# Patient Record
Sex: Female | Born: 1970
Health system: Southern US, Community
[De-identification: ages and names within clinical notes are randomized; demographics above are authoritative.]

## PROBLEM LIST (undated history)

## (undated) DIAGNOSIS — F419 Anxiety disorder, unspecified: Secondary | ICD-10-CM

## (undated) DIAGNOSIS — C50919 Malignant neoplasm of unspecified site of unspecified female breast: Secondary | ICD-10-CM

## (undated) DIAGNOSIS — N879 Dysplasia of cervix uteri, unspecified: Secondary | ICD-10-CM

## (undated) DIAGNOSIS — Z9221 Personal history of antineoplastic chemotherapy: Secondary | ICD-10-CM

## (undated) DIAGNOSIS — S0300XA Dislocation of jaw, unspecified side, initial encounter: Secondary | ICD-10-CM

## (undated) DIAGNOSIS — N200 Calculus of kidney: Secondary | ICD-10-CM

## (undated) DIAGNOSIS — Z923 Personal history of irradiation: Secondary | ICD-10-CM

## (undated) DIAGNOSIS — Z87442 Personal history of urinary calculi: Secondary | ICD-10-CM

## (undated) DIAGNOSIS — Z1379 Encounter for other screening for genetic and chromosomal anomalies: Secondary | ICD-10-CM

## (undated) DIAGNOSIS — Z9889 Other specified postprocedural states: Secondary | ICD-10-CM

## (undated) DIAGNOSIS — K219 Gastro-esophageal reflux disease without esophagitis: Secondary | ICD-10-CM

## (undated) DIAGNOSIS — G43909 Migraine, unspecified, not intractable, without status migrainosus: Secondary | ICD-10-CM

## (undated) DIAGNOSIS — J189 Pneumonia, unspecified organism: Secondary | ICD-10-CM

## (undated) DIAGNOSIS — R21 Rash and other nonspecific skin eruption: Secondary | ICD-10-CM

## (undated) DIAGNOSIS — R112 Nausea with vomiting, unspecified: Secondary | ICD-10-CM

## (undated) DIAGNOSIS — M810 Age-related osteoporosis without current pathological fracture: Secondary | ICD-10-CM

## (undated) HISTORY — DX: Age-related osteoporosis without current pathological fracture: M81.0

## (undated) HISTORY — DX: Rash and other nonspecific skin eruption: R21

## (undated) HISTORY — DX: Personal history of antineoplastic chemotherapy: Z92.21

## (undated) HISTORY — DX: Malignant neoplasm of unspecified site of unspecified female breast: C50.919

## (undated) HISTORY — PX: DILATION AND CURETTAGE OF UTERUS: SHX78

## (undated) HISTORY — PX: OTHER SURGICAL HISTORY: SHX169

## (undated) HISTORY — PX: ADENOIDECTOMY: SUR15

## (undated) HISTORY — DX: Migraine, unspecified, not intractable, without status migrainosus: G43.909

## (undated) HISTORY — PX: RHINOPLASTY: SUR1284

## (undated) HISTORY — DX: Personal history of irradiation: Z92.3

## (undated) HISTORY — DX: Encounter for other screening for genetic and chromosomal anomalies: Z13.79

---

## 1997-09-22 ENCOUNTER — Encounter: Admission: RE | Admit: 1997-09-22 | Discharge: 1997-09-22 | Payer: Self-pay | Admitting: Obstetrics & Gynecology

## 1997-09-22 ENCOUNTER — Other Ambulatory Visit: Admission: RE | Admit: 1997-09-22 | Discharge: 1997-09-22 | Payer: Self-pay | Admitting: Obstetrics & Gynecology

## 1998-03-11 ENCOUNTER — Encounter: Admission: RE | Admit: 1998-03-11 | Discharge: 1998-03-11 | Payer: Self-pay | Admitting: Obstetrics

## 1998-08-05 ENCOUNTER — Encounter: Admission: RE | Admit: 1998-08-05 | Discharge: 1998-08-05 | Payer: Self-pay | Admitting: Obstetrics

## 1998-12-08 ENCOUNTER — Ambulatory Visit (HOSPITAL_COMMUNITY): Admission: RE | Admit: 1998-12-08 | Discharge: 1998-12-08 | Payer: Self-pay | Admitting: Obstetrics

## 1999-04-28 ENCOUNTER — Encounter: Admission: RE | Admit: 1999-04-28 | Discharge: 1999-04-28 | Payer: Self-pay | Admitting: Obstetrics

## 1999-08-03 ENCOUNTER — Encounter: Payer: Self-pay | Admitting: Obstetrics

## 1999-08-03 ENCOUNTER — Ambulatory Visit (HOSPITAL_COMMUNITY): Admission: RE | Admit: 1999-08-03 | Discharge: 1999-08-03 | Payer: Self-pay | Admitting: Obstetrics

## 1999-08-18 ENCOUNTER — Encounter: Admission: RE | Admit: 1999-08-18 | Discharge: 1999-08-18 | Payer: Self-pay | Admitting: Obstetrics

## 2000-02-02 ENCOUNTER — Encounter: Admission: RE | Admit: 2000-02-02 | Discharge: 2000-02-02 | Payer: Self-pay | Admitting: Obstetrics

## 2000-04-10 HISTORY — PX: OTHER SURGICAL HISTORY: SHX169

## 2000-09-24 ENCOUNTER — Inpatient Hospital Stay (HOSPITAL_COMMUNITY): Admission: AD | Admit: 2000-09-24 | Discharge: 2000-09-24 | Payer: Self-pay | Admitting: Obstetrics

## 2000-11-22 ENCOUNTER — Encounter: Admission: RE | Admit: 2000-11-22 | Discharge: 2000-11-22 | Payer: Self-pay | Admitting: Obstetrics

## 2001-01-17 ENCOUNTER — Encounter: Admission: RE | Admit: 2001-01-17 | Discharge: 2001-01-17 | Payer: Self-pay | Admitting: Obstetrics

## 2002-12-29 ENCOUNTER — Ambulatory Visit (HOSPITAL_COMMUNITY): Admission: RE | Admit: 2002-12-29 | Discharge: 2002-12-29 | Payer: Self-pay | Admitting: Obstetrics

## 2002-12-29 ENCOUNTER — Encounter: Payer: Self-pay | Admitting: Obstetrics

## 2003-01-15 ENCOUNTER — Ambulatory Visit (HOSPITAL_COMMUNITY): Admission: RE | Admit: 2003-01-15 | Discharge: 2003-01-15 | Payer: Self-pay | Admitting: Obstetrics

## 2003-01-15 ENCOUNTER — Encounter: Payer: Self-pay | Admitting: Obstetrics

## 2003-04-06 ENCOUNTER — Encounter (INDEPENDENT_AMBULATORY_CARE_PROVIDER_SITE_OTHER): Payer: Self-pay | Admitting: Specialist

## 2003-04-06 ENCOUNTER — Inpatient Hospital Stay (HOSPITAL_COMMUNITY): Admission: RE | Admit: 2003-04-06 | Discharge: 2003-04-09 | Payer: Self-pay | Admitting: Obstetrics

## 2006-11-12 ENCOUNTER — Inpatient Hospital Stay (HOSPITAL_COMMUNITY): Admission: AD | Admit: 2006-11-12 | Discharge: 2006-11-15 | Payer: Self-pay | Admitting: Obstetrics & Gynecology

## 2007-01-17 ENCOUNTER — Encounter: Payer: Self-pay | Admitting: Obstetrics

## 2007-01-17 ENCOUNTER — Ambulatory Visit (HOSPITAL_COMMUNITY): Admission: RE | Admit: 2007-01-17 | Discharge: 2007-01-17 | Payer: Self-pay | Admitting: Obstetrics

## 2007-01-30 ENCOUNTER — Encounter: Admission: RE | Admit: 2007-01-30 | Discharge: 2007-01-30 | Payer: Self-pay | Admitting: Interventional Radiology

## 2007-02-21 ENCOUNTER — Ambulatory Visit (HOSPITAL_COMMUNITY): Admission: RE | Admit: 2007-02-21 | Discharge: 2007-02-21 | Payer: Self-pay | Admitting: Obstetrics

## 2007-05-14 ENCOUNTER — Encounter: Admission: RE | Admit: 2007-05-14 | Discharge: 2007-05-14 | Payer: Self-pay | Admitting: Interventional Radiology

## 2007-07-09 ENCOUNTER — Encounter: Admission: RE | Admit: 2007-07-09 | Discharge: 2007-07-09 | Payer: Self-pay | Admitting: Interventional Radiology

## 2007-07-25 ENCOUNTER — Encounter: Admission: RE | Admit: 2007-07-25 | Discharge: 2007-07-25 | Payer: Self-pay | Admitting: Interventional Radiology

## 2007-08-07 ENCOUNTER — Encounter: Admission: RE | Admit: 2007-08-07 | Discharge: 2007-08-07 | Payer: Self-pay | Admitting: Interventional Radiology

## 2007-09-03 ENCOUNTER — Encounter: Admission: RE | Admit: 2007-09-03 | Discharge: 2007-09-03 | Payer: Self-pay | Admitting: Interventional Radiology

## 2007-09-17 ENCOUNTER — Encounter: Admission: RE | Admit: 2007-09-17 | Discharge: 2007-09-17 | Payer: Self-pay | Admitting: Interventional Radiology

## 2009-12-07 ENCOUNTER — Ambulatory Visit (HOSPITAL_COMMUNITY): Admission: RE | Admit: 2009-12-07 | Discharge: 2009-12-07 | Payer: Self-pay | Admitting: Obstetrics

## 2010-04-10 DIAGNOSIS — C50311 Malignant neoplasm of lower-inner quadrant of right female breast: Secondary | ICD-10-CM

## 2010-04-10 DIAGNOSIS — Z170 Estrogen receptor positive status [ER+]: Secondary | ICD-10-CM

## 2010-04-10 HISTORY — DX: Malignant neoplasm of lower-inner quadrant of right female breast: C50.311

## 2010-04-10 HISTORY — DX: Estrogen receptor positive status (ER+): Z17.0

## 2010-08-23 NOTE — Op Note (Signed)
NAMECONSTANCE, WHITTLE              ACCOUNT NO.:  [id number]   MEDICAL RECORD NO.:  [id number]          PATIENT TYPE:  AMB   LOCATION:  SDC                           FACILITY:  [facility]   PHYSICIAN:  Charles A. Clearance Coots, M.D.DATE OF BIRTH:  1970-05-09   DATE OF PROCEDURE:  01/17/2007  DATE OF DISCHARGE:                               OPERATIVE REPORT   PREOPERATIVE DIAGNOSIS:  Vaginal polyps.   POSTOPERATIVE DIAGNOSIS:  Vaginal polyps.   PROCEDURE:  Resection of vaginal polyps.   SURGEON:  Charles A. Clearance Coots, M.D.   ANESTHESIA:  MAC with local 1% Xylocaine.   ESTIMATED BLOOD LOSS:  Negligible.   COMPLICATIONS:  None.   SPECIMEN:  Vaginal mucosal tissue.   FINDINGS:  Small hypertrophied polypoid tissue at the lower hymenal ring  remnant.   OPERATION:  The patient was brought to the operating room. After  satisfactory IV sedation, the legs were brought up in stirrups and the  vagina was prepped and draped in the usual sterile fashion.  The urinary  bladder was emptied of approximately 50 mL of clear urine.  The small  polyp at the lower right aspect of the hymenal ring remnant was grasped  with Allis forceps and was excised with the scalpel and submitted to  pathology for evaluation.  The mucosa was then closed with interrupted  sutures of 4-0 Vicryl. Two small polyps to the right of the polyp that  was excised were also grasped with Allis forceps and excised with  Metzenbaum scissors and submitted to pathology for evaluation.  The  mucosa was closed with interrupted sutures of 4-0 Vicryl.  Both areas  were anesthetized with subcutaneous injections of a total of  approximately 5 mL of 1% Xylocaine. There was no active bleeding at the  conclusion of the procedure. All instruments were retired.  The patient  tolerated the procedure well and was transported to the recovery room in  satisfactory condition.      Charles A. Clearance Coots, M.D.  Electronically Signed     CAH/MEDQ  D:   01/17/2007  T:  01/17/2007  Job:  [id number]

## 2010-08-26 NOTE — Op Note (Signed)
NAME:  [name], [name]                        ACCOUNT NO.:  [id number]   MEDICAL RECORD NO.:  [id number]                   PATIENT TYPE:  INP   LOCATION:  9399                                 FACILITY:  [facility]   PHYSICIAN:  Charles A. Clearance Coots, M.D.             DATE OF BIRTH:  06-02-1970   DATE OF PROCEDURE:  04/06/2003  DATE OF DISCHARGE:                                 OPERATIVE REPORT   PREOPERATIVE DIAGNOSIS:  Symptomatic uterine fibroids.   POSTOPERATIVE DIAGNOSIS:  Symptomatic uterine fibroids.   PROCEDURE:  Abdominal myomectomy.   SURGEON:  Charles A. Clearance Coots, M.D.   ASSISTANT:  Roseanna Rainbow, M.D.   ANESTHESIA:  General.   ESTIMATED BLOOD LOSS:  300 mL.   COMPLICATIONS:  None.   SPECIMENS:  Uterine fibroid.   FINDINGS:  Approximately 6 cm uterine fibroid in the space between the  urinary bladder, cervix, and lower uterine segment.   OPERATION:  The patient was brought to the operating room and the general  endotracheal anesthesia was established.  The abdomen was prepped and draped  in the usual sterile fashion in the supine position.  A small Pfannenstiel  incision was made with the scalpel that was deepened down to the fascia with  a scalpel.  The fascia was nicked in the midline and the fascial incision  was extended to the left and to the right with curved Mayo scissors.  The  superior and inferior fascial edges were taken off of the rectus muscle with  blunt and sharp dissection.  The rectus muscles were bluntly and sharply  divided in the midline superiorly and inferiorly, being careful to avoid the  urinary bladder inferiorly.  The peritoneum was entered digitally and was  sharply dissected superiorly and inferiorly, being careful to avoid the  urinary bladder inferiorly.  The O'Connor-O'Sullivan self-retaining  retractor was then placed in the incision and the bowel was packed off and  the anterior and posterior blades were placed.  The uterus was then  grasped  with a uterine manipulator and was elevated up into the abdomen.  The  moderate-sized fibroid was then palpated in the vesicocervicouterine space  and attached to the lower uterine segment and anterior cervix.  The  vesicouterine fold of peritoneum was then entered sharply and dissected off  of the lower uterine segment, and the urinary bladder was pushed down and  away from the uterine fibroid both sharply and with blunt dissection.  The  fibroid pseudocapsule was then entered and was dissected down to the base of  the fibroid, which was noted to be attached to the lower uterine segment and  the upper cervix.  A Kelly forceps was placed across the base of the fibroid  and the fibroid was excised above the Kelly forceps intact.  The base of the  fibroid was suture ligated with figure-of-eight sutures of  3-0 Monocryl.  The remaining dead space of tissue  was then plicated with continuous  interlocking sutures of 3-0 Monocryl.  The vesicouterine fold of peritoneum  was then closed with continuous suture of 3-0 Monocryl.  Hemostasis was  excellent at the conclusion of the procedure.  The pelvic cavity was  thoroughly irrigated with warm saline solution.  The anterior and posterior  blades were then removed from the O'Connor-O'Sullivan retractor and the  packing was removed.  The surgical technician indicated that all needle,  sponge, and instrument counts were correct.  The abdomen was then closed as  follows:  The peritoneum was grasped with a Kelly forceps and was closed  with a continuous suture of 2-0 Monocryl.  The fascia was closed with a  continuous suture of 2-0 Vicryl from each corner to the center.  Skin was  closed with a continuous subcuticular suture of 3-0 Monocryl.  Sterile  bandage was applied to the incision closure.  The patient tolerated the  procedure well, was transported to the recovery room in satisfactory  condition.                                                Charles A. Clearance Coots, M.D.    CAH/MEDQ  D:  04/06/2003  T:  04/06/2003  Job:  [id number]

## 2010-08-26 NOTE — Discharge Summary (Signed)
NAME:  [name], [name]                        ACCOUNT NO.:  [id number]   MEDICAL RECORD NO.:  [id number]                   PATIENT TYPE:  INP   LOCATION:  9319                                 FACILITY:  [facility]   PHYSICIAN:  Charles A. Clearance Coots, M.D.             DATE OF BIRTH:  01-17-1971   DATE OF ADMISSION:  04/06/2003  DATE OF DISCHARGE:  04/09/2003                                 DISCHARGE SUMMARY   ADMITTING DIAGNOSES:  Symptomatic uterine fibroid.   DISCHARGE DIAGNOSES:  Symptomatic uterine fibroid status post myomectomy.   DISPOSITION:  Discharged home in good condition.   REASON FOR ADMISSION:  A 40 year old G2, P0 white female with history of  left lower quadrant pain over the past year which has been worse over the  past two months.  The patient also complained of urinary frequency and pain  with intercourse.  Ultrasound was done along with an MRI which revealed a 6  cm uterine fibroid which was adjacent to the cervix and urinary bladder.  The patient desired definitive surgical management.   PAST SURGICAL HISTORY:  1. Laser leg surgery.  2. Sinus surgery.  3. Conization of cervix.   PAST MEDICAL HISTORY:  1. Asthma.  2. Depression.   MEDICATIONS:  1. Vitamins.  2. Iron.  3. Ginkgo biloba.  4. Luetin.  5. Celexa.   ALLERGIES:  PENICILLIN, TUSSIONEX.   FAMILY HISTORY:  Negative for GYN cancer.   SOCIAL HISTORY:  Married.  Negative tobacco, alcohol, or recreational drug  use.   PHYSICAL EXAMINATION:  GENERAL:  Well-nourished, well-developed white female  in no acute distress.  VITAL SIGNS:  Temperature 97.5, pulse 80, respiratory rate 16, blood  pressure 102/60.  HEENT:  Normal.  NECK:  Supple without thyromegaly.  LUNGS:  Clear to auscultation bilaterally.  HEART:  Regular rate and rhythm.  BREASTS:  Soft, nontender.  No masses appreciated.  ABDOMEN:  Soft, nontender.  No organomegaly or masses appreciated.  PELVIC:  Normal external female genitalia.   Vagina:  Normal mucosa.  Cervix:  Flat with no lesions, discharge, or bleeding noted.  Uterus normal size,  shape, contour, mid position.  Mild suprapubic tenderness to deep palpation.  Adnexa:  Questionable left adnexal to anterior mass coming off of the uterus  consistent with fibroid versus ovarian mass.  EXTREMITIES:  Zero clubbing, cyanosis, edema.  SKIN:  Normal.   IMPRESSION:  Symptomatic uterine fibroid.   PLAN:  Myomectomy.   ADMITTING LABORATORY VALUES:  Basic metabolic panel within normal limits.  Coags within normal limits.  Hemoglobin 12.8, hematocrit 37.3, white blood  cell count 9100, platelets 283,000.  Urine pregnancy negative.  Urinalysis  was within normal limits.   HOSPITAL COURSE:  The patient underwent an abdominal myomectomy on April 06, 2003.  There were no intraoperative complications.  Postoperative course  was uncomplicated except poor pain control.  The patient had much improved  pain control  by postoperative day #3 and was therefore discharged home on  postoperative day #3 in good condition.   DISCHARGE LABORATORIES:  Hemoglobin 10.3, hematocrit 29.5, white blood cell  count 10,100, platelets 251,000.   DISCHARGE DISPOSITION:   MEDICATIONS:  1. Ultram two tablets p.o. q.4-6h. as needed for pain.  2. Ibuprofen 800 mg p.o. q.8h. as needed for pain.  3. Allegra D one tablet p.o. b.i.d.  4. Continue multivitamins and iron.   DISCHARGE INSTRUCTIONS:  Routine written discharge instructions were given  per booklet for surgical discharge.  The patient is to follow up in the  office in two weeks for a check-up.                                               Charles A. Clearance Coots, M.D.    CAH/MEDQ  D:  04/09/2003  T:  04/09/2003  Job:  [id number]

## 2010-12-01 ENCOUNTER — Other Ambulatory Visit: Payer: Self-pay | Admitting: Obstetrics

## 2010-12-01 DIAGNOSIS — Z1231 Encounter for screening mammogram for malignant neoplasm of breast: Secondary | ICD-10-CM

## 2010-12-10 DIAGNOSIS — C50919 Malignant neoplasm of unspecified site of unspecified female breast: Secondary | ICD-10-CM

## 2010-12-10 HISTORY — DX: Malignant neoplasm of unspecified site of unspecified female breast: C50.919

## 2010-12-13 ENCOUNTER — Ambulatory Visit (HOSPITAL_COMMUNITY)
Admission: RE | Admit: 2010-12-13 | Discharge: 2010-12-13 | Disposition: A | Discharge status: Home / Self Care | Payer: BC Managed Care – PPO | Source: Ambulatory Visit | Attending: Obstetrics | Admitting: Obstetrics

## 2010-12-13 DIAGNOSIS — Z1231 Encounter for screening mammogram for malignant neoplasm of breast: Secondary | ICD-10-CM

## 2010-12-19 ENCOUNTER — Other Ambulatory Visit: Payer: Self-pay | Admitting: Obstetrics

## 2010-12-19 DIAGNOSIS — N63 Unspecified lump in unspecified breast: Secondary | ICD-10-CM

## 2010-12-22 ENCOUNTER — Other Ambulatory Visit: Payer: Self-pay | Admitting: Obstetrics

## 2010-12-22 ENCOUNTER — Ambulatory Visit
Admission: RE | Admit: 2010-12-22 | Discharge: 2010-12-22 | Disposition: A | Discharge status: Home / Self Care | Payer: BC Managed Care – PPO | Source: Ambulatory Visit | Attending: Obstetrics | Admitting: Obstetrics

## 2010-12-22 DIAGNOSIS — N63 Unspecified lump in unspecified breast: Secondary | ICD-10-CM

## 2010-12-23 ENCOUNTER — Other Ambulatory Visit: Payer: Self-pay | Admitting: Obstetrics

## 2010-12-23 ENCOUNTER — Ambulatory Visit
Admission: RE | Admit: 2010-12-23 | Discharge: 2010-12-23 | Disposition: A | Discharge status: Home / Self Care | Payer: BC Managed Care – PPO | Source: Ambulatory Visit | Attending: Obstetrics | Admitting: Obstetrics

## 2010-12-23 ENCOUNTER — Other Ambulatory Visit: Payer: BC Managed Care – PPO

## 2010-12-23 DIAGNOSIS — N63 Unspecified lump in unspecified breast: Secondary | ICD-10-CM

## 2010-12-26 ENCOUNTER — Other Ambulatory Visit: Payer: Self-pay | Admitting: Obstetrics

## 2010-12-26 ENCOUNTER — Ambulatory Visit
Admission: RE | Admit: 2010-12-26 | Discharge: 2010-12-26 | Disposition: A | Discharge status: Home / Self Care | Payer: BC Managed Care – PPO | Source: Ambulatory Visit | Attending: Obstetrics | Admitting: Obstetrics

## 2010-12-26 DIAGNOSIS — C50911 Malignant neoplasm of unspecified site of right female breast: Secondary | ICD-10-CM

## 2010-12-26 DIAGNOSIS — N63 Unspecified lump in unspecified breast: Secondary | ICD-10-CM

## 2010-12-30 ENCOUNTER — Ambulatory Visit
Admission: RE | Admit: 2010-12-30 | Discharge: 2010-12-30 | Disposition: A | Discharge status: Home / Self Care | Payer: BC Managed Care – PPO | Source: Ambulatory Visit | Attending: Obstetrics | Admitting: Obstetrics

## 2010-12-30 DIAGNOSIS — C50911 Malignant neoplasm of unspecified site of right female breast: Secondary | ICD-10-CM

## 2010-12-30 MED ORDER — GADOBENATE DIMEGLUMINE 529 MG/ML IV SOLN
12.0000 mL | Freq: Once | INTRAVENOUS | Status: AC | PRN
  Administered 2010-12-30: 12 mL via INTRAVENOUS

## 2011-01-04 ENCOUNTER — Ambulatory Visit (HOSPITAL_BASED_OUTPATIENT_CLINIC_OR_DEPARTMENT_OTHER): Payer: BC Managed Care – PPO | Admitting: General Surgery

## 2011-01-04 ENCOUNTER — Other Ambulatory Visit: Payer: Self-pay | Admitting: Oncology

## 2011-01-04 ENCOUNTER — Ambulatory Visit: Payer: BC Managed Care – PPO | Admitting: Physical Therapy

## 2011-01-04 ENCOUNTER — Encounter (INDEPENDENT_AMBULATORY_CARE_PROVIDER_SITE_OTHER): Payer: Self-pay | Admitting: General Surgery

## 2011-01-04 ENCOUNTER — Encounter (HOSPITAL_BASED_OUTPATIENT_CLINIC_OR_DEPARTMENT_OTHER): Payer: BC Managed Care – PPO | Admitting: Oncology

## 2011-01-04 VITALS — BP 115/75 | HR 85 | Temp 98.0°F | Resp 20 | Ht 63.5 in | Wt 132.5 lb

## 2011-01-04 DIAGNOSIS — C50911 Malignant neoplasm of unspecified site of right female breast: Secondary | ICD-10-CM

## 2011-01-04 DIAGNOSIS — N63 Unspecified lump in unspecified breast: Secondary | ICD-10-CM | POA: Insufficient documentation

## 2011-01-04 DIAGNOSIS — C50919 Malignant neoplasm of unspecified site of unspecified female breast: Secondary | ICD-10-CM

## 2011-01-04 DIAGNOSIS — Z170 Estrogen receptor positive status [ER+]: Secondary | ICD-10-CM

## 2011-01-04 LAB — CBC WITH DIFFERENTIAL/PLATELET
BASO%: 0.3 % (ref 0.0–2.0)
Basophils Absolute: 0 10*3/uL (ref 0.0–0.1)
EOS%: 0.8 % (ref 0.0–7.0)
Eosinophils Absolute: 0.1 10*3/uL (ref 0.0–0.5)
HCT: 41.2 % (ref 34.8–46.6)
HGB: 14 g/dL (ref 11.6–15.9)
LYMPH%: 21.3 % (ref 14.0–49.7)
MCH: 31.3 pg (ref 25.1–34.0)
MCHC: 33.9 g/dL (ref 31.5–36.0)
MCV: 92.1 fL (ref 79.5–101.0)
MONO#: 0.3 10*3/uL (ref 0.1–0.9)
MONO%: 4 % (ref 0.0–14.0)
NEUT#: 6.3 10*3/uL (ref 1.5–6.5)
NEUT%: 73.6 % (ref 38.4–76.8)
Platelets: 232 10*3/uL (ref 145–400)
RBC: 4.47 10*6/uL (ref 3.70–5.45)
RDW: 12.4 % (ref 11.2–14.5)
WBC: 8.5 10*3/uL (ref 3.9–10.3)
lymph#: 1.8 10*3/uL (ref 0.9–3.3)

## 2011-01-04 LAB — COMPREHENSIVE METABOLIC PANEL
ALT: 13 U/L (ref 0–35)
AST: 15 U/L (ref 0–37)
Albumin: 3.9 g/dL (ref 3.5–5.2)
Alkaline Phosphatase: 41 U/L (ref 39–117)
BUN: 10 mg/dL (ref 6–23)
CO2: 29 mEq/L (ref 19–32)
Calcium: 9.5 mg/dL (ref 8.4–10.5)
Chloride: 103 mEq/L (ref 96–112)
Creatinine, Ser: 0.8 mg/dL (ref 0.50–1.10)
Glucose, Bld: 75 mg/dL (ref 70–99)
Potassium: 3.8 mEq/L (ref 3.5–5.3)
Sodium: 140 mEq/L (ref 135–145)
Total Bilirubin: 0.3 mg/dL (ref 0.3–1.2)
Total Protein: 7 g/dL (ref 6.0–8.3)

## 2011-01-04 LAB — CANCER ANTIGEN 27.29: CA 27.29: 25 U/mL (ref 0–39)

## 2011-01-04 NOTE — Patient Instructions (Signed)
You have recently been diagnosed with a small invasive cancer in the lower inner quadrant of your right breast. We are very suspicious that you may have cancer that has spread to the right axillary lymph nodes. You will be scheduled for genetic counseling and genetic testing. You will be scheduled for image guided biopsy of the right axillary lymph node by the radiologist. My office will call you to schedule Port-A-Cath placement. I will see you back in the office in 6 weeks to see how you are doing with your chemotherapy. We will continue to discuss surgical options at that time.

## 2011-01-04 NOTE — Progress Notes (Addendum)
Chief Complaint  Patient presents with  . Breast Cancer    HPI [name] is a 40 y.o. female.    This is a 40 year old Caucasian female, referred to me by Dr. Harmon Pier. at the breast center for for evaluation of a newly diagnosed invasive ductal carcinoma of the right breast in the lower inner quadrant. Her gynecologist is Dr. Coral Ceo. Her dermatologist is Dr. Donzetta Starch.  The patient felt a small lump in her right breast in the lower inner quadrant about 2 months ago. Mammograms and ultrasound were performed on 12/22/2010. The radiologist was able to palpate a small mass. Ultrasound revealed a 1.7 x 1.1 cm mass in the right lower inner quadrant. The left breast was felt to be normal. Ultrasound suggested an abnormal right axillary lymph node.  Ultrasound guided biopsy was performed and this revealed invasive ductal carcinoma which was hormone receptor positive and HER-2/neu-positive.  Subsequent MRI was performed on 01/02/2011. This reveals a 2 cm mass in the lower inner quadrant of the right breast and a prominent right axillary lymph node. She has not had a right axillary lymph node biopsy at this time.  Her diagnosis and management was discussed at the breast conference this morning. I am evaluating her and coordinating her care in the breast clinic with Dr. Darnelle Catalan and Dr. Basilio Cairo.  It is notable that the patient has not had any breast problems in the past. It is notable that her family history is negative for breast cancer and negative for ovarian cancer. She is married. Her husband is here with her. They have a 66-year-old child. HPI  Past Medical History  Diagnosis Date  . Migraines   . Asthma     as a child    Past Surgical History  Procedure Date  . Rhinoplasty   . Adnoid   . Fibroid tumor surgery   . Dilation and curettage of uterus     Family History  Problem Relation Age of Onset  . Adopted: Yes    Social History History  Substance Use Topics  .  Smoking status: Former Smoker    Quit date: 04/10/1998  . Smokeless tobacco: Not on file  . Alcohol Use: Not on file    No Known Allergies  Current Outpatient Prescriptions  Medication Sig Dispense Refill  . aspirin-acetaminophen-caffeine (EXCEDRIN MIGRAINE) 250-250-65 MG per tablet Take 1 tablet by mouth 1 day or 1 dose.        . cephALEXin (KEFLEX) 500 MG capsule Take 500 mg by mouth 2 (two) times daily.        [name] guaiFENesin (MUCINEX) 600 MG 12 hr tablet Take 1,200 mg by mouth 2 (two) times daily.        . hydrOXYzine (ATARAX/VISTARIL) 25 MG tablet Take 25 mg by mouth 3 (three) times daily as needed.        [name] ibuprofen (ADVIL,MOTRIN) 800 MG tablet Take 800 mg by mouth every 8 (eight) hours as needed.        . loratadine (CLARITIN) 10 MG tablet Take 10 mg by mouth 2 (two) times daily.        . Multiple Vitamin (MULTIVITAMIN) capsule Take 1 capsule by mouth daily.          Review of Systems Review of Systems  Constitutional: Positive for fatigue. Negative for fever, chills, diaphoresis, appetite change and unexpected weight change.  HENT: Positive for sinus pressure.   Eyes: Negative.   Respiratory: Negative.   Cardiovascular: Negative.  Gastrointestinal: Negative.   Genitourinary: Negative.   Musculoskeletal: Positive for myalgias, back pain, joint swelling and arthralgias. Negative for gait problem.  Skin: Negative.   Neurological: Positive for headaches. Negative for dizziness, tremors, seizures, syncope, facial asymmetry, speech difficulty, weakness, light-headedness and numbness.  Hematological: Negative.   Psychiatric/Behavioral: Negative.     Blood pressure 115/75, pulse 85, temperature 98 F (36.7 C), resp. rate 20, height 5' 3.5" (1.613 m), weight 132 lb 8 oz (60.102 kg).  Physical Exam Physical Exam  Constitutional: She is oriented to person, place, and time. She appears well-developed and well-nourished. No distress.       Pleasant, healthy, thin. Husband with her  throughout the encounter.  HENT:  Head: Normocephalic and atraumatic.  Nose: Nose normal.  Mouth/Throat: No oropharyngeal exudate.  Eyes: Conjunctivae and EOM are normal. Pupils are equal, round, and reactive to light.  Neck: Normal range of motion. Neck supple. No JVD present. No tracheal deviation present. No thyromegaly present.  Cardiovascular: Normal rate, regular rhythm, normal heart sounds and intact distal pulses.  Exam reveals no gallop.   No murmur heard. Pulmonary/Chest: Effort normal and breath sounds normal. No stridor. No respiratory distress. She has no wheezes. She has no rales. She exhibits no tenderness.    Abdominal: Soft. Bowel sounds are normal. She exhibits no distension and no mass. There is no tenderness. There is no rebound and no guarding.  Musculoskeletal: Normal range of motion. She exhibits no edema and no tenderness.  Lymphadenopathy:    She has no cervical adenopathy.  Neurological: She is oriented to person, place, and time. She exhibits normal muscle tone. Coordination normal.  Skin: Skin is warm and dry. No rash noted. She is not diaphoretic. No erythema. No pallor.  Psychiatric: She has a normal mood and affect. Her behavior is normal. Judgment and thought content normal.    Data Reviewed I have reviewed her mammograms, ultrasound, MRI, pathology report and her slides. We have discussed her care in breast clinic this afternoon with Dr. Darnelle Catalan and Dr. Basilio Cairo. I have coordinated her care plan with them. Her case was discussed this morning in breast conference.  Assessment    Invasive ductal carcinoma left breast, lower inner quadrant, 2 cm tumor, receptor positive, HER-2/neu-positive. Clinical stage T1c., N1.    Plan    We had a long discussion about surgical management of her breast cancer, and cancer management in general. We talked about genetic counseling and testing. We talked about lumpectomy, axillary lymph node dissection. We talked about  mastectomy both unilateral and bilateral. We talked about plastic surgery to restore symmetry. At this point in time she is undecided as to the extent of her eventual surgery.  She is going to be scheduled for genetic counseling and genetic testing.  She is going to be scheduled for image guided biopsy of the right axillary lymph node, which is suspected as being positive.  I'm going to schedule her for Port-A-Cath placement.I have discussed the indications and details of Port-A-Cath placement. Risks and complications have been outlined, including but not limited to bleeding, infection, pneumothorax, catheter embolization, cardiac pulmonary and thromboembolic problems. She seems to understand these issues well. At this time all of her questions were answered. She is in full agreement with this plan.  Following the Port-A-Cath placement,Dr. Magrinat plans neoadjuvant chemotherapy for about 3 months.  After 3 months we will plan to get an another MRI to judge response to therapy.  Ultimately she will need definitive breast surgery  which will include an axillary lymph node dissection if she is proven to have node-positive disease, and sentinel lymph node if it is proven to be negative by image guided biopsy.  She will almost certainly need adjuvant radiation therapy.  I will see her back in approximately 6 weeks after her Port-A-Cath placement to see how she is doing with neoadjuvant therapy, and we will discuss her attitude and opinions regarding extent of definitive breast surgery at that time.  I have offered to refer her to a plastic surgeon and she is going to consider that.       Sherley Mckenney M 01/04/2011, 4:41 PM   2

## 2011-01-05 ENCOUNTER — Other Ambulatory Visit: Payer: Self-pay | Admitting: Oncology

## 2011-01-05 DIAGNOSIS — N63 Unspecified lump in unspecified breast: Secondary | ICD-10-CM

## 2011-01-05 DIAGNOSIS — C50919 Malignant neoplasm of unspecified site of unspecified female breast: Secondary | ICD-10-CM

## 2011-01-09 ENCOUNTER — Ambulatory Visit
Admission: RE | Admit: 2011-01-09 | Discharge: 2011-01-09 | Disposition: A | Discharge status: Home / Self Care | Payer: BC Managed Care – PPO | Source: Ambulatory Visit | Attending: Oncology | Admitting: Oncology

## 2011-01-09 DIAGNOSIS — N63 Unspecified lump in unspecified breast: Secondary | ICD-10-CM

## 2011-01-12 ENCOUNTER — Encounter (HOSPITAL_COMMUNITY)
Admission: RE | Admit: 2011-01-12 | Discharge: 2011-01-12 | Disposition: A | Discharge status: Home / Self Care | Payer: BC Managed Care – PPO | Source: Ambulatory Visit | Attending: Oncology | Admitting: Oncology

## 2011-01-12 DIAGNOSIS — C50919 Malignant neoplasm of unspecified site of unspecified female breast: Secondary | ICD-10-CM | POA: Insufficient documentation

## 2011-01-12 LAB — GLUCOSE, CAPILLARY: Glucose-Capillary: 87 mg/dL (ref 70–99)

## 2011-01-12 MED ORDER — IOHEXOL 300 MG/ML  SOLN
80.0000 mL | Freq: Once | INTRAMUSCULAR | Status: AC | PRN
  Administered 2011-01-12: 80 mL via INTRAVENOUS

## 2011-01-12 MED ORDER — FLUDEOXYGLUCOSE F - 18 (FDG) INJECTION
19.4000 | Freq: Once | INTRAVENOUS | Status: AC | PRN
  Administered 2011-01-12: 19.4 via INTRAVENOUS

## 2011-01-16 ENCOUNTER — Other Ambulatory Visit: Payer: Self-pay | Admitting: Oncology

## 2011-01-16 DIAGNOSIS — C50319 Malignant neoplasm of lower-inner quadrant of unspecified female breast: Secondary | ICD-10-CM

## 2011-01-16 LAB — CBC WITH DIFFERENTIAL/PLATELET
BASO%: 0.4 % (ref 0.0–2.0)
Basophils Absolute: 0 10*3/uL (ref 0.0–0.1)
EOS%: 2 % (ref 0.0–7.0)
Eosinophils Absolute: 0.1 10*3/uL (ref 0.0–0.5)
HCT: 41.2 % (ref 34.8–46.6)
HGB: 14 g/dL (ref 11.6–15.9)
LYMPH%: 26.5 % (ref 14.0–49.7)
MCH: 31.4 pg (ref 25.1–34.0)
MCHC: 34 g/dL (ref 31.5–36.0)
MCV: 92.2 fL (ref 79.5–101.0)
MONO#: 0.5 10*3/uL (ref 0.1–0.9)
MONO%: 7.5 % (ref 0.0–14.0)
NEUT#: 3.8 10*3/uL (ref 1.5–6.5)
NEUT%: 63.6 % (ref 38.4–76.8)
Platelets: 256 10*3/uL (ref 145–400)
RBC: 4.47 10*6/uL (ref 3.70–5.45)
RDW: 12.5 % (ref 11.2–14.5)
WBC: 6 10*3/uL (ref 3.9–10.3)
lymph#: 1.6 10*3/uL (ref 0.9–3.3)

## 2011-01-16 LAB — URINALYSIS, MICROSCOPIC - CHCC
Bilirubin (Urine): NEGATIVE
Glucose: NEGATIVE g/dL
Ketones: NEGATIVE mg/dL
Leukocyte Esterase: NEGATIVE
Nitrite: NEGATIVE
Protein: NEGATIVE mg/dL
Specific Gravity, Urine: 1.01 (ref 1.003–1.035)
pH: 7.5 (ref 4.6–8.0)

## 2011-01-16 LAB — COMPREHENSIVE METABOLIC PANEL
ALT: 16 U/L (ref 0–35)
AST: 22 U/L (ref 0–37)
Albumin: 4 g/dL (ref 3.5–5.2)
Alkaline Phosphatase: 53 U/L (ref 39–117)
BUN: 10 mg/dL (ref 6–23)
CO2: 31 mEq/L (ref 19–32)
Calcium: 9.6 mg/dL (ref 8.4–10.5)
Chloride: 100 mEq/L (ref 96–112)
Creatinine, Ser: 0.7 mg/dL (ref 0.50–1.10)
Glucose, Bld: 80 mg/dL (ref 70–99)
Potassium: 3.6 mEq/L (ref 3.5–5.3)
Sodium: 140 mEq/L (ref 135–145)
Total Bilirubin: 0.5 mg/dL (ref 0.3–1.2)
Total Protein: 7 g/dL (ref 6.0–8.3)

## 2011-01-17 ENCOUNTER — Ambulatory Visit (HOSPITAL_COMMUNITY): Payer: BC Managed Care – PPO

## 2011-01-17 ENCOUNTER — Ambulatory Visit (HOSPITAL_BASED_OUTPATIENT_CLINIC_OR_DEPARTMENT_OTHER)
Admission: RE | Admit: 2011-01-17 | Discharge: 2011-01-17 | Disposition: A | Discharge status: Home / Self Care | Payer: BC Managed Care – PPO | Source: Ambulatory Visit | Attending: General Surgery | Admitting: General Surgery

## 2011-01-17 ENCOUNTER — Ambulatory Visit (HOSPITAL_COMMUNITY): Payer: BC Managed Care – PPO | Attending: General Surgery

## 2011-01-17 DIAGNOSIS — G43909 Migraine, unspecified, not intractable, without status migrainosus: Secondary | ICD-10-CM | POA: Insufficient documentation

## 2011-01-17 DIAGNOSIS — C50919 Malignant neoplasm of unspecified site of unspecified female breast: Secondary | ICD-10-CM

## 2011-01-17 DIAGNOSIS — F411 Generalized anxiety disorder: Secondary | ICD-10-CM | POA: Insufficient documentation

## 2011-01-17 DIAGNOSIS — C50319 Malignant neoplasm of lower-inner quadrant of unspecified female breast: Secondary | ICD-10-CM | POA: Insufficient documentation

## 2011-01-18 NOTE — Op Note (Signed)
[name], [name]              ACCOUNT NO.:  [id number]  MEDICAL RECORD NO.:  [id number]  LOCATION:                                 FACILITY:  PHYSICIAN:  Angelia Mould. Derrell Lolling, M.D.DATE OF BIRTH:  [birth year]  DATE OF PROCEDURE:  01/17/2011 DATE OF DISCHARGE:                              OPERATIVE REPORT   PREOPERATIVE DIAGNOSIS:  Invasive ductal carcinoma, right breast, clinical stage T1c, N0.  POSTOPERATIVE DIAGNOSIS:  Invasive ductal carcinoma, right breast, clinical stage T1c, N0.  OPERATION PERFORMED: 1. Insertion of 8-French PowerPort-isp venous vascular access device. 2. Utilization of fluoroscopy for guidance of catheter insertion.  SURGEON:  Angelia Mould. Derrell Lolling, MD  OPERATIVE INDICATION:  This is a 40 year old Caucasian female who felt a small lump in the right breast in the lower inner quadrant recently. Mammograms and ultrasounds and MRIs and biopsies were performed.  By best judgment, she has invasive ductal carcinoma, measuring 1.7 x 1.1 cm in the right breast lower inner quadrant.  This tumor is hormone receptor positive and Her-2 positive.  X-rays suggested prominent right axillary lymph nodes.  Physical exam suggested some small lymph nodes. Image-guided biopsy of her lymph nodes shows normal lymph node tissue. Her oncologist has recommended neoadjuvant chemotherapy.  She is still trying to decide the extent of her surgery.  Genetic testing has just begun.  She has decided to go ahead with the preop chemotherapy and a Port-A-Cath is requested.  She has been counseled regarding Port-A-Cath insertion as an outpatient.  OPERATIVE TECHNIQUE:  Following the induction of general LMA anesthesia, the patient was positioned with a small roll behind her shoulders and her arms at her sides.  Her neck and chest were prepped and draped in a sterile fashion.  Surgical time-out was held identifying correct patient and correct procedure.  Intravenous antibiotics were given.   0.5% Marcaine with epinephrine was used as a local infiltration anesthetic.  The left subclavian venipuncture was performed and a guidewire inserted into the superior vena cava under fluoroscopic guidance.  A small incision was made at the wire insertion site.  Using fluoroscopy, I marked a template on the chest wall skin where I thought the catheter should be.  The template was designed to get the tip of the catheter in the superior vena cava just above the right atrium.  A transverse incision was made in the left infraclavicular area about 2 cm below the wire insertion site.  A subcutaneous pocket was created at the level of the pectoralis fascia.  Using a tunneling device, I drew the catheter from the port pocket site into the wire insertion site.  I then placed the catheter along the chest wall along the marked template and cut the catheter 21 cm in length.  The catheter was then secured to the port with a locking device and the port and catheter flushed with heparinized saline.  The port was sutured to the pectoralis fascia with 2 interrupted sutures of 2-0 Prolene.  I could still flush the catheter nicely.  With the patient back in Trendelenburg position, I inserted the dilator and peel-away sheath assembly over the guidewire without any difficulty.  The wire and the dilator were  removed and the catheter was threaded through the peel-away sheath and the peel-away sheath removed. The catheter flushed easily and had excellent blood return.  Fluoroscopy confirmed the catheter to be in good position with the tip in the superior vena cava just above the right atrial junction.  There was no deformity of the catheter anywhere along its course.  The port and catheter were then flushed with concentrated heparin.  There was no bleeding in the wounds.  Subcutaneous tissue was closed with interrupted 3-0 Vicryl sutures and the skin incisions closed with subcuticular sutures of 4-0 Monocryl and  Steri-Strips.  Clean bandage was placed and the patient taken to the recovery room in stable condition.  ESTIMATED BLOOD LOSS:  About 10 mL.  COMPLICATIONS:  None.  Sponge, needle, and instrument counts were correct.  A chest x-ray will be obtained in the PACU.     Angelia Mould. Derrell Lolling, M.D.     HMI/MEDQ  D:  01/17/2011  T:  01/17/2011  Job:  [id number]  cc:   Lowella Dell, M.D. Charles A. Clearance Coots, M.D.  Electronically Signed by Claud Kelp M.D. on 01/18/2011 06:05:29 AM

## 2011-01-19 ENCOUNTER — Ambulatory Visit (HOSPITAL_COMMUNITY)
Admission: RE | Admit: 2011-01-19 | Discharge: 2011-01-19 | Disposition: A | Discharge status: Home / Self Care | Payer: BC Managed Care – PPO | Source: Ambulatory Visit | Attending: Oncology | Admitting: Oncology

## 2011-01-19 DIAGNOSIS — C50919 Malignant neoplasm of unspecified site of unspecified female breast: Secondary | ICD-10-CM | POA: Insufficient documentation

## 2011-01-19 LAB — CBC
HCT: 38.9
Hemoglobin: 13
MCHC: 33.5
MCV: 84.7
Platelets: 268
RBC: 4.59
RDW: 17.9 — ABNORMAL HIGH
WBC: 6.2

## 2011-01-23 ENCOUNTER — Encounter (HOSPITAL_BASED_OUTPATIENT_CLINIC_OR_DEPARTMENT_OTHER): Payer: BC Managed Care – PPO | Admitting: Oncology

## 2011-01-23 ENCOUNTER — Other Ambulatory Visit: Payer: Self-pay | Admitting: Obstetrics

## 2011-01-23 DIAGNOSIS — C50919 Malignant neoplasm of unspecified site of unspecified female breast: Secondary | ICD-10-CM

## 2011-01-23 LAB — CBC
HCT: 33.8 — ABNORMAL LOW
Hemoglobin: 11.3 — ABNORMAL LOW
MCHC: 33.3
MCV: 83.9
Platelets: 281
RBC: 4.03
RDW: 13.8
WBC: 13.2 — ABNORMAL HIGH

## 2011-01-23 LAB — RPR: RPR Ser Ql: NONREACTIVE

## 2011-01-26 ENCOUNTER — Encounter: Payer: Self-pay | Admitting: *Deleted

## 2011-01-26 ENCOUNTER — Encounter (HOSPITAL_COMMUNITY): Payer: Self-pay | Admitting: Internal Medicine

## 2011-02-01 ENCOUNTER — Telehealth: Payer: Self-pay | Admitting: *Deleted

## 2011-02-01 ENCOUNTER — Other Ambulatory Visit: Payer: Self-pay | Admitting: Physician Assistant

## 2011-02-01 DIAGNOSIS — C50919 Malignant neoplasm of unspecified site of unspecified female breast: Secondary | ICD-10-CM

## 2011-02-02 ENCOUNTER — Encounter (HOSPITAL_BASED_OUTPATIENT_CLINIC_OR_DEPARTMENT_OTHER): Payer: BC Managed Care – PPO | Admitting: Oncology

## 2011-02-02 DIAGNOSIS — C50319 Malignant neoplasm of lower-inner quadrant of unspecified female breast: Secondary | ICD-10-CM

## 2011-02-02 DIAGNOSIS — Z5111 Encounter for antineoplastic chemotherapy: Secondary | ICD-10-CM

## 2011-02-03 ENCOUNTER — Encounter (HOSPITAL_BASED_OUTPATIENT_CLINIC_OR_DEPARTMENT_OTHER): Payer: BC Managed Care – PPO | Admitting: Oncology

## 2011-02-03 DIAGNOSIS — Z5189 Encounter for other specified aftercare: Secondary | ICD-10-CM

## 2011-02-03 DIAGNOSIS — C50319 Malignant neoplasm of lower-inner quadrant of unspecified female breast: Secondary | ICD-10-CM

## 2011-02-05 ENCOUNTER — Other Ambulatory Visit: Payer: Self-pay | Admitting: Obstetrics

## 2011-02-06 ENCOUNTER — Other Ambulatory Visit: Payer: Self-pay | Admitting: *Deleted

## 2011-02-07 ENCOUNTER — Encounter: Payer: Self-pay | Admitting: *Deleted

## 2011-02-09 ENCOUNTER — Other Ambulatory Visit: Payer: Self-pay | Admitting: Oncology

## 2011-02-09 ENCOUNTER — Ambulatory Visit (HOSPITAL_COMMUNITY)
Admission: RE | Admit: 2011-02-09 | Discharge: 2011-02-09 | Disposition: A | Discharge status: Home / Self Care | Payer: BC Managed Care – PPO | Source: Ambulatory Visit | Attending: Internal Medicine | Admitting: Internal Medicine

## 2011-02-09 ENCOUNTER — Encounter (HOSPITAL_BASED_OUTPATIENT_CLINIC_OR_DEPARTMENT_OTHER): Payer: BC Managed Care – PPO | Admitting: Oncology

## 2011-02-09 VITALS — BP 92/46 | Wt 128.4 lb

## 2011-02-09 DIAGNOSIS — R609 Edema, unspecified: Secondary | ICD-10-CM

## 2011-02-09 DIAGNOSIS — K5900 Constipation, unspecified: Secondary | ICD-10-CM

## 2011-02-09 DIAGNOSIS — N63 Unspecified lump in unspecified breast: Secondary | ICD-10-CM

## 2011-02-09 DIAGNOSIS — C50919 Malignant neoplasm of unspecified site of unspecified female breast: Secondary | ICD-10-CM | POA: Insufficient documentation

## 2011-02-09 DIAGNOSIS — C50319 Malignant neoplasm of lower-inner quadrant of unspecified female breast: Secondary | ICD-10-CM

## 2011-02-09 LAB — CBC WITH DIFFERENTIAL/PLATELET
BASO%: 0.7 % (ref 0.0–2.0)
Basophils Absolute: 0.1 10*3/uL (ref 0.0–0.1)
EOS%: 0.3 % (ref 0.0–7.0)
Eosinophils Absolute: 0 10*3/uL (ref 0.0–0.5)
HCT: 38.8 % (ref 34.8–46.6)
HGB: 13.4 g/dL (ref 11.6–15.9)
LYMPH%: 26.8 % (ref 14.0–49.7)
MCH: 30.4 pg (ref 25.1–34.0)
MCHC: 34.5 g/dL (ref 31.5–36.0)
MCV: 88 fL (ref 79.5–101.0)
MONO#: 2 10*3/uL — ABNORMAL HIGH (ref 0.1–0.9)
MONO%: 18.9 % — ABNORMAL HIGH (ref 0.0–14.0)
NEUT#: 5.7 10*3/uL (ref 1.5–6.5)
NEUT%: 53.3 % (ref 38.4–76.8)
Platelets: 188 10*3/uL (ref 145–400)
RBC: 4.41 10*6/uL (ref 3.70–5.45)
RDW: 12.1 % (ref 11.2–14.5)
WBC: 10.7 10*3/uL — ABNORMAL HIGH (ref 3.9–10.3)
lymph#: 2.9 10*3/uL (ref 0.9–3.3)
nRBC: 0 % (ref 0–0)

## 2011-02-09 LAB — URINALYSIS, MICROSCOPIC - CHCC
Bilirubin (Urine): NEGATIVE
Glucose: NEGATIVE g/dL
Ketones: NEGATIVE mg/dL
Leukocyte Esterase: NEGATIVE
Nitrite: NEGATIVE
Protein: <30 mg/dL
Specific Gravity, Urine: 1.01 (ref 1.003–1.035)
pH: 6 (ref 4.6–8.0)

## 2011-02-09 NOTE — Patient Instructions (Signed)
No medications added at this time.  Follow up in 3 months with a repeat echocardiogram.

## 2011-02-09 NOTE — Progress Notes (Signed)
HPI:  [name] is a 40 yo with recently diagnosed (12/22/2010) right invasive ductal carcinoma, grade 2, estrogen receptor positive, progesterone receptor positive and Her2 amplification.  She was started on carboplatin and docetaxel together with Herceptin every 3 weeks x6 (first dose last week) then Herceptin is to be continued for a total of 1 year.  She was referred to Dr. Gala Romney for close monitoring for cardiotoxicity while on herceptin.    Echo reviewed from 01/19/11:  EF 50-55%.  Lateral S' velocity 10.5 cm/s  She presents for further evaluation.  Up to this date has undergone 1 round of chemotherapy.  She experienced mild dizziness while getting out of the shower the other day.  Other than that she denies SOB/orthopnea/PND/CP.  She denies lower extremity edema.  She has noted since her initial chemotherapy session her BP has been soft.     ROS: All other systems normal except as mentioned in HPI, past medical history and problem list.    Past Medical History  Diagnosis Date  . Migraines   . Asthma     as a child  . Breast cancer Sept 2012    right breast    Current Outpatient Prescriptions  Medication Sig Dispense Refill  . aspirin-acetaminophen-caffeine (EXCEDRIN MIGRAINE) 250-250-65 MG per tablet Take 1 tablet by mouth 1 day or 1 dose.        . cephALEXin (KEFLEX) 500 MG capsule Take 500 mg by mouth 2 (two) times daily.        [name] guaiFENesin (MUCINEX) 600 MG 12 hr tablet Take 1,200 mg by mouth 2 (two) times daily.        . hydrOXYzine (ATARAX/VISTARIL) 25 MG tablet Take 25 mg by mouth 3 (three) times daily as needed.        [name] ibuprofen (ADVIL,MOTRIN) 800 MG tablet Take 800 mg by mouth every 8 (eight) hours as needed.        . loratadine (CLARITIN) 10 MG tablet Take 10 mg by mouth 2 (two) times daily.        . Multiple Vitamin (MULTIVITAMIN) capsule Take 1 capsule by mouth daily.           Allergies  Allergen Reactions  . Codeine   . Morphine And Related   .  Penicillins   . Tussionex Pennkinetic Er (Chlorpheniramine-Hydrocodone)     History   Social History  . Marital Status: Married    Spouse Name: N/A    Number of Children: N/A  . Years of Education: N/A   Occupational History  . Not on file.   Social History Main Topics  . Smoking status: Former Smoker    Quit date: 04/10/1998  . Smokeless tobacco: Not on file  . Alcohol Use: No  . Drug Use: No  . Sexually Active: Not on file   Other Topics Concern  . Not on file   Social History Narrative   She was adopted by her grandparents as a child.  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  She works in a Agricultural consultant.    Family History  Problem Relation Age of Onset  . Adopted: Yes  . Heart attack Father     PHYSICAL EXAM: Filed Vitals:   02/09/11 1627  BP: 92/46  Wt: 128  General:  Well appearing. No respiratory difficulty HEENT: normal Neck: supple. Flat JVD. Carotids 2+ bilat; no bruits. No lymphadenopathy or thryomegaly appreciated. Cor: PMI nondisplaced. Regular rate & rhythm. No  rubs, gallops or murmurs. Lungs: clear Abdomen: soft, nontender, nondistended. No hepatosplenomegaly. No bruits or masses. Good bowel sounds. Extremities: no cyanosis, clubbing, rash, edema, port site below mid clavicular area on right side Neuro: alert & oriented x 3, cranial nerves grossly intact. moves all 4 extremities w/o difficulty. Affect pleasant.  ECG:  Results for orders placed in visit on 02/09/11 (from the past 24 hour(s))  URINALYSIS, MICROSCOPIC - [facility]     Status: Normal   Collection Time   02/09/11  2:21 PM      Component Value Range   Glucose Negative  Negative (g/dL)   Bilirubin (Urine) Negative  Negative    Ketones Negative  Negative (mg/dL)   Specific Gravity, Urine 1.010  1.003 - 1.035    Blood Moderate  Negative    pH 6.0  4.6 - 8.0    Protein < 30  Negative- <30 (mg/dL)   Nitrite Negative  Negative    Leukocyte Esterase Negative   Negative    RBC count 7-10  0 - 2    WBC, UA 0-2  0 - 2    Bacteria, UA Few  Negative- Trace    Epithelial Cells Few  Negative- Few    Mucus, UA Small  Negative- Small    No results found.   ASSESSMENT & PLAN:

## 2011-02-09 NOTE — Assessment & Plan Note (Addendum)
Discussed risks of Herceptin cartiotoxicity as well as role of cardio-oncology clinic in early detection and treatment, if needed. Echo and chart reviewed.  EF 50-55%.  Continue Herceptin therapy at this time.  Will not add beta blocker or ACE-I at this time with soft BP and no AC therapy.  Will continue to monitor for any decline in EF with echos every 3 months.    Patient seen and examined with Ulyess Blossom PA-C. We discussed all aspects of the encounter. I agree with the assessment and plan as stated above.

## 2011-02-11 LAB — URINE CULTURE

## 2011-02-13 NOTE — Progress Notes (Signed)
Patient seen and examined with Nicki Bradley PA-C. We discussed all aspects of the encounter. I agree with the assessment and plan as stated above.   

## 2011-02-14 NOTE — Telephone Encounter (Signed)
Looking up pt. Info.

## 2011-02-16 ENCOUNTER — Other Ambulatory Visit (HOSPITAL_BASED_OUTPATIENT_CLINIC_OR_DEPARTMENT_OTHER): Payer: BC Managed Care – PPO | Admitting: Lab

## 2011-02-16 DIAGNOSIS — C50319 Malignant neoplasm of lower-inner quadrant of unspecified female breast: Secondary | ICD-10-CM

## 2011-02-16 DIAGNOSIS — C50919 Malignant neoplasm of unspecified site of unspecified female breast: Secondary | ICD-10-CM

## 2011-02-16 LAB — CBC WITH DIFFERENTIAL/PLATELET
BASO%: 0.3 % (ref 0.0–2.0)
Basophils Absolute: 0 10*3/uL (ref 0.0–0.1)
EOS%: 0.2 % (ref 0.0–7.0)
Eosinophils Absolute: 0 10*3/uL (ref 0.0–0.5)
HCT: 36.5 % (ref 34.8–46.6)
HGB: 12.5 g/dL (ref 11.6–15.9)
LYMPH%: 22.7 % (ref 14.0–49.7)
MCH: 31.1 pg (ref 25.1–34.0)
MCHC: 34.3 g/dL (ref 31.5–36.0)
MCV: 90.7 fL (ref 79.5–101.0)
MONO#: 0.5 10*3/uL (ref 0.1–0.9)
MONO%: 4.9 % (ref 0.0–14.0)
NEUT#: 7.7 10*3/uL — ABNORMAL HIGH (ref 1.5–6.5)
NEUT%: 71.9 % (ref 38.4–76.8)
Platelets: 168 10*3/uL (ref 145–400)
RBC: 4.03 10*6/uL (ref 3.70–5.45)
RDW: 12.6 % (ref 11.2–14.5)
WBC: 10.7 10*3/uL — ABNORMAL HIGH (ref 3.9–10.3)
lymph#: 2.4 10*3/uL (ref 0.9–3.3)

## 2011-02-17 ENCOUNTER — Telehealth: Payer: Self-pay | Admitting: *Deleted

## 2011-02-17 MED ORDER — LORAZEPAM 0.5 MG PO TABS: 0.5000 mg | ORAL_TABLET | Freq: Three times a day (TID) | ORAL | Status: AC

## 2011-02-17 NOTE — Telephone Encounter (Signed)
Rec call from pt reporting that she would like to have a RX called in for anxiety. Pt states that MD ordered Ativan during her chemo infusion. Rx for Ativan obtained from Dr Donnie Coffin in Dr Magrinat's absence.

## 2011-02-22 ENCOUNTER — Other Ambulatory Visit: Payer: Self-pay | Admitting: Oncology

## 2011-02-23 ENCOUNTER — Other Ambulatory Visit (HOSPITAL_BASED_OUTPATIENT_CLINIC_OR_DEPARTMENT_OTHER): Payer: BC Managed Care – PPO | Admitting: Lab

## 2011-02-23 ENCOUNTER — Other Ambulatory Visit: Payer: Self-pay | Admitting: Oncology

## 2011-02-23 ENCOUNTER — Ambulatory Visit (HOSPITAL_BASED_OUTPATIENT_CLINIC_OR_DEPARTMENT_OTHER): Payer: BC Managed Care – PPO

## 2011-02-23 ENCOUNTER — Ambulatory Visit (HOSPITAL_BASED_OUTPATIENT_CLINIC_OR_DEPARTMENT_OTHER): Payer: BC Managed Care – PPO | Admitting: Physician Assistant

## 2011-02-23 ENCOUNTER — Encounter: Payer: Self-pay | Admitting: Physician Assistant

## 2011-02-23 VITALS — BP 118/74 | HR 84 | Temp 97.5°F | Ht 63.5 in | Wt 130.5 lb

## 2011-02-23 DIAGNOSIS — Z170 Estrogen receptor positive status [ER+]: Secondary | ICD-10-CM

## 2011-02-23 DIAGNOSIS — N63 Unspecified lump in unspecified breast: Secondary | ICD-10-CM

## 2011-02-23 DIAGNOSIS — C50919 Malignant neoplasm of unspecified site of unspecified female breast: Secondary | ICD-10-CM

## 2011-02-23 DIAGNOSIS — C50319 Malignant neoplasm of lower-inner quadrant of unspecified female breast: Secondary | ICD-10-CM

## 2011-02-23 DIAGNOSIS — Z5111 Encounter for antineoplastic chemotherapy: Secondary | ICD-10-CM

## 2011-02-23 LAB — CBC WITH DIFFERENTIAL/PLATELET
BASO%: 0 % (ref 0.0–2.0)
Basophils Absolute: 0 10*3/uL (ref 0.0–0.1)
EOS%: 0 % (ref 0.0–7.0)
Eosinophils Absolute: 0 10*3/uL (ref 0.0–0.5)
HCT: 38.1 % (ref 34.8–46.6)
HGB: 13 g/dL (ref 11.6–15.9)
LYMPH%: 4.6 % — ABNORMAL LOW (ref 14.0–49.7)
MCH: 31.4 pg (ref 25.1–34.0)
MCHC: 34.2 g/dL (ref 31.5–36.0)
MCV: 91.9 fL (ref 79.5–101.0)
MONO#: 0.2 10*3/uL (ref 0.1–0.9)
MONO%: 1.6 % (ref 0.0–14.0)
NEUT#: 12.1 10*3/uL — ABNORMAL HIGH (ref 1.5–6.5)
NEUT%: 93.8 % — ABNORMAL HIGH (ref 38.4–76.8)
Platelets: 289 10*3/uL (ref 145–400)
RBC: 4.15 10*6/uL (ref 3.70–5.45)
RDW: 12.6 % (ref 11.2–14.5)
WBC: 12.9 10*3/uL — ABNORMAL HIGH (ref 3.9–10.3)
lymph#: 0.6 10*3/uL — ABNORMAL LOW (ref 0.9–3.3)

## 2011-02-23 MED ORDER — TRASTUZUMAB CHEMO INJECTION 440 MG
6.0000 mg/kg | Freq: Once | INTRAVENOUS | Status: AC
  Administered 2011-02-23: 357 mg via INTRAVENOUS
  Filled 2011-02-23: qty 17

## 2011-02-23 MED ORDER — DEXAMETHASONE SODIUM PHOSPHATE 4 MG/ML IJ SOLN
20.0000 mg | Freq: Once | INTRAMUSCULAR | Status: AC
  Administered 2011-02-23: 20 mg via INTRAVENOUS

## 2011-02-23 MED ORDER — SODIUM CHLORIDE 0.9 % IJ SOLN
10.0000 mL | INTRAMUSCULAR | Status: DC | PRN
  Administered 2011-02-23: 10 mL
  Filled 2011-02-23: qty 10

## 2011-02-23 MED ORDER — SODIUM CHLORIDE 0.9 % IV SOLN: Freq: Once | INTRAVENOUS | Status: DC

## 2011-02-23 MED ORDER — HEPARIN SOD (PORK) LOCK FLUSH 100 UNIT/ML IV SOLN
500.0000 [IU] | Freq: Once | INTRAVENOUS | Status: AC | PRN
  Administered 2011-02-23: 500 [IU]
  Filled 2011-02-23: qty 5

## 2011-02-23 MED ORDER — LORAZEPAM 2 MG/ML IJ SOLN
0.5000 mg | Freq: Once | INTRAMUSCULAR | Status: AC
  Administered 2011-02-23: 0.5 mg via INTRAVENOUS

## 2011-02-23 MED ORDER — HEPARIN SOD (PORK) LOCK FLUSH 100 UNIT/ML IV SOLN
500.0000 [IU] | Freq: Once | INTRAVENOUS | Status: DC | PRN
  Filled 2011-02-23: qty 5

## 2011-02-23 MED ORDER — DIPHENHYDRAMINE HCL 25 MG PO CAPS
25.0000 mg | ORAL_CAPSULE | Freq: Once | ORAL | Status: AC
  Administered 2011-02-23: 25 mg via ORAL

## 2011-02-23 MED ORDER — ONDANSETRON 16 MG/50ML IVPB (CHCC)
16.0000 mg | Freq: Once | INTRAVENOUS | Status: AC
  Administered 2011-02-23: 16 mg via INTRAVENOUS

## 2011-02-23 MED ORDER — SODIUM CHLORIDE 0.9 % IJ SOLN
10.0000 mL | INTRAMUSCULAR | Status: DC | PRN
  Filled 2011-02-23: qty 10

## 2011-02-23 MED ORDER — DOCETAXEL CHEMO INJECTION 160 MG/16ML
75.0000 mg/m2 | Freq: Once | INTRAVENOUS | Status: AC
  Administered 2011-02-23: 120 mg via INTRAVENOUS
  Filled 2011-02-23: qty 12

## 2011-02-23 MED ORDER — ACETAMINOPHEN 325 MG PO TABS
650.0000 mg | ORAL_TABLET | Freq: Once | ORAL | Status: AC
  Administered 2011-02-23: 650 mg via ORAL

## 2011-02-23 MED ORDER — SODIUM CHLORIDE 0.9 % IV SOLN
751.8000 mg | Freq: Once | INTRAVENOUS | Status: AC
  Administered 2011-02-23: 750 mg via INTRAVENOUS
  Filled 2011-02-23: qty 75

## 2011-02-23 NOTE — Progress Notes (Signed)
Hematology and Oncology Follow Up Visit  [name] [id number] January 21, 1971 40 y.o. 02/23/2011 1:45 PM   Interim History:   Patient returns today accompanied by her husband for followup of her locally advanced right breast carcinoma, due for day 1 cycle 2 of 6 planned q. 3 week doses of docetaxel, carboplatin, and trastuzumab , given in the neoadjuvant setting. She receives Neulasta on day 2 for granulocyte support. Overall, the patient tolerated the first dose rather well, but did have many questions today. In fact over half of our 45 minute appointment today was spent answering her questions and going over her regimen again prior to her second dose.  The patient had a slight upper respiratory infection earlier in the week which is improving slowly. She is utilizing Mucinex affectively. She has had no fevers chills or night sweats. She has an occasional cough, but no increased shortness of breath. Her 55-year-old son has also been sick. Otherwise, the patient has recovered quite well from her first dose of chemotherapy. She of course lost her hair, but is wearing a very nice wig, although she is a little tearful when discussing this today. She started her menstrual cycle over the weekend, November 11. She continues to be fatigued. She's had no signs of numbness or tingling in the extremities. No mouth ulcers or oral sensitivity. No excessive tearing. No skin changes, rashes, or nail bed changes.  A detailed review of systems is otherwise noncontributory as noted below.  Review of Systems: Constitutional:  Fatigue, but no weight loss, fever, night sweats Eyes: negative WUJ:WJXBJ congestion and sore throat Cardiovascular: no chest pain or dyspnea on exertion Respiratory: positive for - cough, but no shortness of breath Neurological: negative Dermatological: negative Gastrointestinal: no abdominal pain, change in bowel habits, or black or bloody stools Genito-Urinary: no dysuria, trouble  voiding, or hematuria Hematological and Lymphatic: negative Breast: positive for - palpable mass in right breast Musculoskeletal: negative Remaining ROS negative.  Medications: I have reviewed the patient's current medications.  Allergies:  Allergies  Allergen Reactions  . Codeine   . Morphine And Related   . Penicillins   . Tussionex Pennkinetic Er (Chlorpheniramine-Hydrocodone)      Physical Exam:  Blood pressure 118/74, pulse 84, temperature 97.5 F (36.4 C), height 5' 3.5" (1.613 m), weight 130 lb 8 oz (59.194 kg). HEENT:  Sclerae anicteric, conjunctivae pink.  Oropharynx clear.  No mucositis or candidiasis.  Nodes:  No cervical, supraclavicular, or axillary lymphadenopathy palpated.  Breast Exam:  Right breast with palpable mass in the inner lower quadrant, measuring approximately 2 cm palpation.  No obvious skin changes. Left breast is benign.  No masses, discharge, skin change, or nipple inversion. Port is intact in the left upper chest wall, no erythema, edema, or evidence of infection.  Lungs:  Clear to auscultation bilaterally.  No crackles, rhonchi, or wheezes.  Heart:  Regular rate and rhythm.  Abdomen:  Soft, nontender.  Positive bowel sounds.  No organomegaly or masses palpated.  Musculoskeletal:  No focal spinal tenderness to palpation.  Extremities:  Benign.  No peripheral edema or cyanosis.  Skin:  Benign.  Neuro:  Nonfocal.  Lab Results: Lab Results  Component Value Date   WBC 12.9* 02/23/2011   HGB 13.0 02/23/2011   HCT 38.1 02/23/2011   MCV 91.9 02/23/2011   PLT 289 02/23/2011     Chemistry      Component Value Date/Time   NA 140 01/16/2011 1550   K 3.6 01/16/2011 1550  CL 100 01/16/2011 1550   CO2 31 01/16/2011 1550   BUN 10 01/16/2011 1550   CREATININE 0.70 01/16/2011 1550      Component Value Date/Time   CALCIUM 9.6 01/16/2011 1550   ALKPHOS 53 01/16/2011 1550   AST 22 01/16/2011 1550   ALT 16 01/16/2011 1550   BILITOT 0.5 01/16/2011 1550          Impression and Plan: 40 year old [place], [name] woman, status post right breast biopsy in September 2012 for a clinical 2 cm invasive ductal carcinoma, with some enlarged lymph nodes, the largest one of which was negative by biopsy. The tumor was grade 2, ER +88%, PR +11%, HER-2/neu positive, ratio of 4.61, and a proliferation marker of 36%. Currently being treated in the neoadjuvant setting, due for day 1 cycle 2 of 6 planned q. 3 week doses of docetaxel and carboplatin being given along with trastuzumab. Neulasta is given on day 2 for granulocyte support.  The patient will proceed to treatment today as scheduled for her second dose of neoadjuvant chemotherapy. She will return tomorrow for Neulasta injection on day 2 and we'll see Dr. Darnelle Catalan next week on November 20 for assessment chemotoxicity. We have not yet scheduled her for a repeat breast MRI, and that will need to be scheduled at her next visit, to be completed in early December. I will mention that the patient premedicated with her oral dexamethasone as instructed, beginning yesterday. She has her anti-nausea medication on hand at home, and voices an understanding of how to use it appropriately. Again, the patient had multiple questions as did her husband. I also advised them to keep a list over the next week so that they will have an opportunity to ask Dr. Darnelle Catalan any additional questions that have when they see him next week.   plan was reviewed with the patient, who voices understanding and agreement.  She knows to call with any changes or problems.    Tiney Zipper, PA-C 11/15/20121:45 PM

## 2011-02-24 ENCOUNTER — Ambulatory Visit (HOSPITAL_BASED_OUTPATIENT_CLINIC_OR_DEPARTMENT_OTHER): Payer: BC Managed Care – PPO

## 2011-02-24 VITALS — BP 100/62 | HR 76 | Temp 98.5°F

## 2011-02-24 DIAGNOSIS — C50319 Malignant neoplasm of lower-inner quadrant of unspecified female breast: Secondary | ICD-10-CM

## 2011-02-24 DIAGNOSIS — N63 Unspecified lump in unspecified breast: Secondary | ICD-10-CM

## 2011-02-24 MED ORDER — PEGFILGRASTIM INJECTION 6 MG/0.6ML
6.0000 mg | Freq: Once | SUBCUTANEOUS | Status: AC
  Administered 2011-02-24: 6 mg via SUBCUTANEOUS
  Filled 2011-02-24: qty 0.6

## 2011-02-27 ENCOUNTER — Encounter: Payer: Self-pay | Admitting: *Deleted

## 2011-02-28 ENCOUNTER — Ambulatory Visit (HOSPITAL_BASED_OUTPATIENT_CLINIC_OR_DEPARTMENT_OTHER): Payer: BC Managed Care – PPO | Admitting: Oncology

## 2011-02-28 ENCOUNTER — Other Ambulatory Visit (HOSPITAL_BASED_OUTPATIENT_CLINIC_OR_DEPARTMENT_OTHER): Payer: BC Managed Care – PPO | Admitting: Lab

## 2011-02-28 ENCOUNTER — Other Ambulatory Visit: Payer: Self-pay | Admitting: Oncology

## 2011-02-28 VITALS — BP 112/73 | HR 103 | Temp 98.6°F | Ht 63.5 in | Wt 128.9 lb

## 2011-02-28 DIAGNOSIS — K5900 Constipation, unspecified: Secondary | ICD-10-CM

## 2011-02-28 DIAGNOSIS — Z170 Estrogen receptor positive status [ER+]: Secondary | ICD-10-CM

## 2011-02-28 DIAGNOSIS — C50319 Malignant neoplasm of lower-inner quadrant of unspecified female breast: Secondary | ICD-10-CM

## 2011-02-28 DIAGNOSIS — J029 Acute pharyngitis, unspecified: Secondary | ICD-10-CM

## 2011-02-28 DIAGNOSIS — C50919 Malignant neoplasm of unspecified site of unspecified female breast: Secondary | ICD-10-CM

## 2011-02-28 LAB — CBC WITH DIFFERENTIAL/PLATELET
BASO%: 0.3 % (ref 0.0–2.0)
Basophils Absolute: 0 10*3/uL (ref 0.0–0.1)
EOS%: 0.6 % (ref 0.0–7.0)
Eosinophils Absolute: 0 10*3/uL (ref 0.0–0.5)
HCT: 34.1 % — ABNORMAL LOW (ref 34.8–46.6)
HGB: 11.6 g/dL (ref 11.6–15.9)
LYMPH%: 17.2 % (ref 14.0–49.7)
MCH: 31.2 pg (ref 25.1–34.0)
MCHC: 34 g/dL (ref 31.5–36.0)
MCV: 91.7 fL (ref 79.5–101.0)
MONO#: 0.1 10*3/uL (ref 0.1–0.9)
MONO%: 1.9 % (ref 0.0–14.0)
NEUT#: 4 10*3/uL (ref 1.5–6.5)
NEUT%: 80 % — ABNORMAL HIGH (ref 38.4–76.8)
Platelets: 133 10*3/uL — ABNORMAL LOW (ref 145–400)
RBC: 3.72 10*6/uL (ref 3.70–5.45)
RDW: 13.5 % (ref 11.2–14.5)
WBC: 5 10*3/uL (ref 3.9–10.3)
lymph#: 0.9 10*3/uL (ref 0.9–3.3)

## 2011-02-28 NOTE — Progress Notes (Signed)
ID: [email or web address]   Interval History: Alexah returns today with her husband [employer] for followup of her breast cancer. Today is day 5 of cycle 2 of 6 planned cycles of carboplatin docetaxel and trastuzumab. She is continuing to work full-time, although some days of work she finds it little hard to concentrate. She had her regular. Starting about 8 days to go.  ROS:  She continues to get constipated with each cycle. She is doing better at controlling this but hasn't quite found a magic formulate yet. I suggested she try a half a bottle of magnesium citrate on the second or third day after treatment and see how that works. She has had a little bit of a sore throat. She got an infection from her son and and got better than it got worse again. She is having minimal of nasal production, a little bit of hoarseness, but no fever no cough or pleurisy. Sometimes she gets crampy in the abdomen, she feels tired and weak and currently fairly fatigued. She is right in the middle of the steroid slump. Otherwise a detailed review of systems was noncontributory.  Medications: I have reviewed the patient's current medications.  Current Outpatient Prescriptions  Medication Sig Dispense Refill  . aspirin-acetaminophen-caffeine (EXCEDRIN MIGRAINE) 250-250-65 MG per tablet Take 1 tablet by mouth 1 day or 1 dose.        . cephALEXin (KEFLEX) 500 MG capsule Take 500 mg by mouth 2 (two) times daily.        [name] dexamethasone (DECADRON) 1 MG tablet Take 1 mg by mouth daily with breakfast. Takes several days prior to chemotherapy.       . fish oil-omega-3 fatty acids 1000 MG capsule Take 2 g by mouth daily.        [name] guaiFENesin (MUCINEX) 600 MG 12 hr tablet Take 1,200 mg by mouth 2 (two) times daily.        . hydrOXYzine (ATARAX/VISTARIL) 25 MG tablet Take 25 mg by mouth 3 (three) times daily as needed.        [name] ibuprofen (ADVIL,MOTRIN) 800 MG tablet Take 800 mg by mouth every 8 (eight) hours as needed.        . loratadine  (CLARITIN) 10 MG tablet Take 10 mg by mouth 2 (two) times daily.        [name] LORazepam (ATIVAN) 0.5 MG tablet Take 1 tablet (0.5 mg total) by mouth every 8 (eight) hours.  30 tablet  0  . metoCLOPramide (REGLAN) 10 MG tablet Take 10 mg by mouth 4 (four) times daily.        . Multiple Vitamin (MULTIVITAMIN) capsule Take 1 capsule by mouth daily.        . ondansetron (ZOFRAN) 4 MG tablet Take 4 mg by mouth every 8 (eight) hours as needed.           Objective: Vital signs in last 24 hours: BP 112/73  Pulse 103  Temp 98.6 F (37 C)  Ht 5' 3.5" (1.613 m)  Wt 128 lb 14.4 oz (58.469 kg)  BMI 22.48 kg/m2   Physical Exam:    Sclerae unicteric  Oropharynx clear  No peripheral adenopathy  Lungs clear -- no rales or rhonchi  Heart regular rate and rhythm  Abdomen benign  MSK no focal spinal tenderness, no peripheral edema  Neuro nonfocal  Breast exam: I do not palpate a well-defined mass in the right breast there are no skin changes or nipple retraction the left breast is  unremarkable  Lab Results:   CBC Lab Results  Component Value Date   WBC 5.0 02/28/2011   HGB 11.6 02/28/2011   HCT 34.1* 02/28/2011   MCV 91.7 02/28/2011   PLT 133* 02/28/2011    Studies/Results: No new studies obtained.     Assessment:  40 year old [place] woman status post right breast biopsy September of 2012 for a clinical E2 centimeter invasive ductal carcinoma, with some enlarged axillary lymph nodes, but the largest one being biopsy negative, the tumor being grade 2, estrogen receptor 88% and progesterone receptor 11% positive, , with HER-2 amplification at a CISH ratio of 4.61, and an MIB-1-1 of 36%; being treated neoadjuvantly with  docetaxel/carboplatin/trastuzumab.   Plan:  The plan is to continue to a total of 6 cycles and then proceed to definitive surgery. Of course the trastuzumab will be continued for a full year. She is strongly considering bilateral mastectomies and is planning to  contact Dr. Derrell Lolling to discuss this further. Today we spent the majority of the visit discussing strategies to make her treatments more bearable, including a better bowel prophylaxis as discussed above, use of saline rinse to gargle with for sore throat, and generally reassurance that I do expect her to do really well a long-term. She had questions regarding use of "detoxification saunas" and I have discouraged anything to unusual while she is receiving chemotherapy. Once again was done of course it'll be less of an issue to consider these alternative therapies. Overall I think she is doing quite well. She knows to call for any problems that for the next visit here which is scheduled for December 6.   Terrie Grajales C 02/28/2011

## 2011-03-16 ENCOUNTER — Telehealth: Payer: Self-pay | Admitting: *Deleted

## 2011-03-16 ENCOUNTER — Other Ambulatory Visit (HOSPITAL_BASED_OUTPATIENT_CLINIC_OR_DEPARTMENT_OTHER): Payer: BC Managed Care – PPO | Admitting: Lab

## 2011-03-16 ENCOUNTER — Other Ambulatory Visit: Payer: Self-pay | Admitting: Oncology

## 2011-03-16 ENCOUNTER — Ambulatory Visit (HOSPITAL_BASED_OUTPATIENT_CLINIC_OR_DEPARTMENT_OTHER): Payer: BC Managed Care – PPO

## 2011-03-16 ENCOUNTER — Ambulatory Visit (HOSPITAL_BASED_OUTPATIENT_CLINIC_OR_DEPARTMENT_OTHER): Payer: BC Managed Care – PPO | Admitting: Physician Assistant

## 2011-03-16 ENCOUNTER — Encounter: Payer: Self-pay | Admitting: Physician Assistant

## 2011-03-16 ENCOUNTER — Ambulatory Visit: Payer: BC Managed Care – PPO

## 2011-03-16 VITALS — BP 111/74 | HR 72 | Temp 98.0°F | Ht 63.5 in | Wt 130.5 lb

## 2011-03-16 DIAGNOSIS — Z5112 Encounter for antineoplastic immunotherapy: Secondary | ICD-10-CM

## 2011-03-16 DIAGNOSIS — C50319 Malignant neoplasm of lower-inner quadrant of unspecified female breast: Secondary | ICD-10-CM

## 2011-03-16 DIAGNOSIS — R350 Frequency of micturition: Secondary | ICD-10-CM

## 2011-03-16 DIAGNOSIS — R300 Dysuria: Secondary | ICD-10-CM

## 2011-03-16 DIAGNOSIS — N63 Unspecified lump in unspecified breast: Secondary | ICD-10-CM

## 2011-03-16 DIAGNOSIS — Z170 Estrogen receptor positive status [ER+]: Secondary | ICD-10-CM

## 2011-03-16 DIAGNOSIS — C50919 Malignant neoplasm of unspecified site of unspecified female breast: Secondary | ICD-10-CM

## 2011-03-16 DIAGNOSIS — Z5111 Encounter for antineoplastic chemotherapy: Secondary | ICD-10-CM

## 2011-03-16 LAB — COMPREHENSIVE METABOLIC PANEL
ALT: 30 U/L (ref 0–35)
AST: 21 U/L (ref 0–37)
Albumin: 4.5 g/dL (ref 3.5–5.2)
Alkaline Phosphatase: 69 U/L (ref 39–117)
BUN: 14 mg/dL (ref 6–23)
CO2: 27 mEq/L (ref 19–32)
Calcium: 9.6 mg/dL (ref 8.4–10.5)
Chloride: 103 mEq/L (ref 96–112)
Creatinine, Ser: 0.73 mg/dL (ref 0.50–1.10)
Glucose, Bld: 93 mg/dL (ref 70–99)
Potassium: 4.1 mEq/L (ref 3.5–5.3)
Sodium: 141 mEq/L (ref 135–145)
Total Bilirubin: 0.5 mg/dL (ref 0.3–1.2)
Total Protein: 5.8 g/dL — ABNORMAL LOW (ref 6.0–8.3)

## 2011-03-16 LAB — CBC WITH DIFFERENTIAL/PLATELET
BASO%: 0.1 % (ref 0.0–2.0)
Basophils Absolute: 0 10*3/uL (ref 0.0–0.1)
EOS%: 0 % (ref 0.0–7.0)
Eosinophils Absolute: 0 10*3/uL (ref 0.0–0.5)
HCT: 34.4 % — ABNORMAL LOW (ref 34.8–46.6)
HGB: 11.6 g/dL (ref 11.6–15.9)
LYMPH%: 8.9 % — ABNORMAL LOW (ref 14.0–49.7)
MCH: 31.6 pg (ref 25.1–34.0)
MCHC: 33.7 g/dL (ref 31.5–36.0)
MCV: 93.7 fL (ref 79.5–101.0)
MONO#: 0.6 10*3/uL (ref 0.1–0.9)
MONO%: 4.8 % (ref 0.0–14.0)
NEUT#: 10.6 10*3/uL — ABNORMAL HIGH (ref 1.5–6.5)
NEUT%: 86.2 % — ABNORMAL HIGH (ref 38.4–76.8)
Platelets: 236 10*3/uL (ref 145–400)
RBC: 3.67 10*6/uL — ABNORMAL LOW (ref 3.70–5.45)
RDW: 16.1 % — ABNORMAL HIGH (ref 11.2–14.5)
WBC: 12.3 10*3/uL — ABNORMAL HIGH (ref 3.9–10.3)
lymph#: 1.1 10*3/uL (ref 0.9–3.3)

## 2011-03-16 LAB — URINALYSIS, MICROSCOPIC - CHCC
Bilirubin (Urine): NEGATIVE
Glucose: NEGATIVE g/dL
Ketones: NEGATIVE mg/dL
Leukocyte Esterase: NEGATIVE
Nitrite: NEGATIVE
Protein: NEGATIVE mg/dL
Specific Gravity, Urine: 1.01 (ref 1.003–1.035)
WBC, UA: NEGATIVE (ref 0–2)
pH: 6.5 (ref 4.6–8.0)

## 2011-03-16 MED ORDER — DOCETAXEL CHEMO INJECTION 160 MG/16ML
75.0000 mg/m2 | Freq: Once | INTRAVENOUS | Status: AC
  Administered 2011-03-16: 120 mg via INTRAVENOUS
  Filled 2011-03-16: qty 12

## 2011-03-16 MED ORDER — ACETAMINOPHEN 325 MG PO TABS
650.0000 mg | ORAL_TABLET | Freq: Once | ORAL | Status: AC
  Administered 2011-03-16: 325 mg via ORAL

## 2011-03-16 MED ORDER — DIPHENHYDRAMINE HCL 25 MG PO CAPS
25.0000 mg | ORAL_CAPSULE | Freq: Once | ORAL | Status: AC
  Administered 2011-03-16: 25 mg via ORAL

## 2011-03-16 MED ORDER — DEXAMETHASONE SODIUM PHOSPHATE 4 MG/ML IJ SOLN
20.0000 mg | Freq: Once | INTRAMUSCULAR | Status: AC
  Administered 2011-03-16: 20 mg via INTRAVENOUS

## 2011-03-16 MED ORDER — LORAZEPAM 2 MG/ML IJ SOLN
0.5000 mg | Freq: Once | INTRAMUSCULAR | Status: AC
  Administered 2011-03-16: 13:00:00 via INTRAVENOUS

## 2011-03-16 MED ORDER — SODIUM CHLORIDE 0.9 % IJ SOLN
10.0000 mL | INTRAMUSCULAR | Status: DC | PRN
  Filled 2011-03-16: qty 10

## 2011-03-16 MED ORDER — TRASTUZUMAB CHEMO INJECTION 440 MG
6.0000 mg/kg | Freq: Once | INTRAVENOUS | Status: AC
  Administered 2011-03-16: 357 mg via INTRAVENOUS
  Filled 2011-03-16: qty 17

## 2011-03-16 MED ORDER — SODIUM CHLORIDE 0.9 % IV SOLN: Freq: Once | INTRAVENOUS | Status: DC

## 2011-03-16 MED ORDER — SODIUM CHLORIDE 0.9 % IV SOLN
751.8000 mg | Freq: Once | INTRAVENOUS | Status: AC
  Administered 2011-03-16: 750 mg via INTRAVENOUS
  Filled 2011-03-16: qty 75

## 2011-03-16 MED ORDER — ONDANSETRON 16 MG/50ML IVPB (CHCC)
16.0000 mg | Freq: Once | INTRAVENOUS | Status: AC
  Administered 2011-03-16: 16 mg via INTRAVENOUS

## 2011-03-16 MED ORDER — HEPARIN SOD (PORK) LOCK FLUSH 100 UNIT/ML IV SOLN
500.0000 [IU] | Freq: Once | INTRAVENOUS | Status: DC | PRN
  Filled 2011-03-16: qty 5

## 2011-03-16 NOTE — Telephone Encounter (Signed)
gave patient appointments for 04-2011 and 05-2011 printed out calendar and gave to the patient

## 2011-03-16 NOTE — Progress Notes (Signed)
Hematology and Oncology Follow Up Visit  [name] [id number] [birth year] 40 y.o. 03/16/2011    HPI: Swetha Rayle (goes by "Miriah Maruyama") is a [place] woman who, at the age of 18, was referred by Dr. Coral Ceo for treatment of right breast carcinoma.  The patient palpated a mass in her right breast which she brought to Dr. Verdell Carmine attention. A prior screening mammogram on 02/06/2010 had been unremarkable. The patient was then scheduled for bilateral diagnostic mammogram and right breast ultrasonography on 12/22/2010, the studies demonstrating an ill defined mass with heterogeneous calcifications in the inner lower right breast, measuring 1.7 cm. The breasts are heterogeneously dense, but there were no other mammographic abnormalities in either breast. Clinically, there was a palpable firm mass at the 5:00 position of the right breast, 5 cm from the nipple. There were no palpable abnormalities in the right axilla. Ultrasound confirmed a 1.7 cm irregular, hypoechoic mass in the same position. There were some lymph nodes noted in the right exam of, one measuring 1.6 cm with a minimally thickened cortex inferiorly.  An ultrasound-guided core biopsy on 12/23/2010 [phone]) showed an invasive ductal carcinoma, grade 2, ER positive at 88%, PR positive at 11 is %, HER-2/neu positive with a FISH ratio of 4.61, and an MIB-1 of 36%.  The patient was referred to Dr. Claud Kelp in bilateral breast MRIs were obtained 12/30/2010. This showed the mass in question in the right breast to measure 2.0 cm proximally. In the right exam was a 1.8 cm lymph node with a thickened cortex. The left breast was unremarkable.  The patient is currently receiving neoadjuvant chemotherapy to consist of 6 q. three-week doses of carboplatin, docetaxel, and trastuzumab, with Neulasta given on day 2 for granulocyte support.  Interim History:   Patient returns today accompanied by her husband, [employer], for  followup of her locally advanced right breast carcinoma. She is due for day 1 cycle 3 of 6 planned every 3 week doses of docetaxel, carboplatin, and trastuzumab given in the neoadjuvant setting. She receives Neulasta on day 2 for granulocyte support.  Overall the patient is feeling well. She says she "finally" recovered from what was an apparent upper respiratory infection that the whole family had over Thanksgiving.  She's had no fevers, chills, or night sweats. Her cough has almost completely resolved. She's having less of a runny nose, less nasal congestion. She's had no sore throat, and denies any phlegm production or hemoptysis. No mouth ulcers or oral sensitivity. She does note that she clears her throat quite a bit, admits that she has not been taking the omeprazole daily, only for a few days surrounding her treatments.  The patient has been having her regular periods, but this time around seems to be only spotting. She does notice increased urination, increased frequency, and mild dysuria which is new.  The patient has had no signs of numbness or tingling in the extremities. She had some swelling in the feet bilaterally following cycle 2, but this has resolved. She has some occasional tearing and utilizes her eyedrops affectively in appropriately. She's had no skin changes, rashes, nailbed changes, or nail bed sensitivity.  A detailed review of systems is otherwise noncontributory as noted below.  Review of Systems: Constitutional:  no weight loss, fever, night sweats and feels well Eyes: excessive tearing WGN:FAOZH congestion and nasal discharge Cardiovascular: no chest pain or dyspnea on exertion Respiratory: no cough, shortness of breath, or wheezing Neurological: negative Dermatological: negative Gastrointestinal: no abdominal  pain, change in bowel habits, or black or bloody stools Genito-Urinary: positive for - dysuria and urinary frequency/urgency Hematological and Lymphatic:  negative Breast: negative Musculoskeletal: negative Remaining ROS negative.  Medications: I have reviewed the patient's current medications. Current Outpatient Prescriptions  Medication Sig Dispense Refill  . aspirin-acetaminophen-caffeine (EXCEDRIN MIGRAINE) 250-250-65 MG per tablet Take 1 tablet by mouth 1 day or 1 dose.        [name] dexamethasone (DECADRON) 1 MG tablet Take 1 mg by mouth daily with breakfast. Takes several days prior to chemotherapy.       . fish oil-omega-3 fatty acids 1000 MG capsule Take 2 g by mouth daily.        [name] guaiFENesin (MUCINEX) 600 MG 12 hr tablet Take 1,200 mg by mouth 2 (two) times daily.        [name] ibuprofen (ADVIL,MOTRIN) 800 MG tablet Take 800 mg by mouth every 8 (eight) hours as needed.        . lidocaine-prilocaine (EMLA) cream Apply 1 application topically as needed.      . loratadine (CLARITIN) 10 MG tablet Take 10 mg by mouth 2 (two) times daily.        [name] LORazepam (ATIVAN) 1 MG tablet Take 0.5 mg by mouth as needed.      . metoCLOPramide (REGLAN) 10 MG tablet Take 10 mg by mouth 4 (four) times daily.        . Multiple Vitamin (MULTIVITAMIN) capsule Take 1 capsule by mouth daily.        [name] omeprazole (PRILOSEC) 20 MG capsule Take 20 mg by mouth Twice daily.      . ondansetron (ZOFRAN) 4 MG tablet Take 4 mg by mouth every 8 (eight) hours as needed.        . prenatal vitamin w/FE, FA (PRENATAL 1 + 1) 27-1 MG TABS Take 1 tablet by mouth Daily.      [name] tobramycin-dexamethasone (TOBRADEX) ophthalmic solution Apply 1 drop to eye Twice daily.      . cephALEXin (KEFLEX) 500 MG capsule Take 500 mg by mouth 2 (two) times daily.        . hydrOXYzine (ATARAX/VISTARIL) 25 MG tablet Take 25 mg by mouth 3 (three) times daily as needed.          Allergies:  Allergies  Allergen Reactions  . Codeine   . Morphine And Related   . Penicillins   . Tussionex Pennkinetic Er (Chlorpheniramine-Hydrocodone)     Physical Exam: Filed Vitals:   03/16/11 1129  BP: 111/74    Pulse: 72  Temp: 98 F (36.7 C)   HEENT:  Sclerae anicteric, conjunctivae pink.  Oropharynx clear.  No mucositis or candidiasis.   Nodes:  No cervical, supraclavicular, or axillary lymphadenopathy palpated.  Breast Exam:  Right breast, unable to palpate a distinct mass in the right breast today, no skin changes or nipple inversion noted. Left breast, benign with no masses, skin changes, nipple inversion, or nipple discharge. Port is intact in the left upper chest wall with no erythema, edema, or evidence of infection .  Lungs:  Clear to auscultation bilaterally.  No crackles, rhonchi, or wheezes.   Heart:  Regular rate and rhythm.   Abdomen:  Soft, nontender.  Positive bowel sounds.  No organomegaly or masses palpated.   Musculoskeletal:  No focal spinal tenderness to palpation.  Extremities:  Benign.  No peripheral edema or cyanosis.   Skin:  Benign.   Neuro:  Nonfocal.   Lab Results: Lab Results  Component Value Date   WBC 12.3* 03/16/2011   HGB 11.6 03/16/2011   HCT 34.4* 03/16/2011   MCV 93.7 03/16/2011   PLT 236 03/16/2011   NEUTROABS 10.6* 03/16/2011     Chemistry      Component Value Date/Time   NA 140 01/16/2011 1550   K 3.6 01/16/2011 1550   CL 100 01/16/2011 1550   CO2 31 01/16/2011 1550   BUN 10 01/16/2011 1550   CREATININE 0.70 01/16/2011 1550      Component Value Date/Time   CALCIUM 9.6 01/16/2011 1550   ALKPHOS 53 01/16/2011 1550   AST 22 01/16/2011 1550   ALT 16 01/16/2011 1550   BILITOT 0.5 01/16/2011 1550        Radiological Studies: No results found.   Assessment:  40 year old [place], [name] woman, status post right breast biopsy in September 2012 for a clinical 2 cm invasive ductal carcinoma, with some enlarged lymph nodes, the largest one of which was negative by biopsy. The tumor was grade 2, ER +88%, PR +11%, HER-2/neu positive, ratio of 4.61, and a proliferation marker of 36%. Currently being treated in the neoadjuvant setting, due for day 1 cycle 3 of 6  planned q. 3 week doses of docetaxel and carboplatin being given along with trastuzumab. Neulasta is given on day 2 for granulocyte support.   Plan:  The patient will proceed to treatment today as scheduled for her third dose of docetaxel, carboplatin, and trastuzumab. She'll return tomorrow on day 2 for Neulasta injection. I will see her again next week on December 13 for assessment of chemotoxicity.  The patient is at the halfway point with her neoadjuvant therapy. Accordingly, we will order a repeat breast MRI in the next couple of weeks to assess response to therapy. She scheduled to meet with Dr. Darnelle Catalan on December 19 to review those results. I will then see her the following week prior to her fourth scheduled dose of chemotherapy on December 27.  We are going to obtain a urinalysis and urine culture today to evaluate for possible urinary tract infection in light of her increased urinary frequency and mild dysuria.   This plan was reviewed with the patient, who voices understanding and agreement.  She knows to call with any changes or problems.    Icelyn Navarrete, PA-C 03/16/2011

## 2011-03-17 ENCOUNTER — Ambulatory Visit (HOSPITAL_BASED_OUTPATIENT_CLINIC_OR_DEPARTMENT_OTHER): Payer: BC Managed Care – PPO

## 2011-03-17 VITALS — BP 101/67 | HR 60 | Temp 98.2°F

## 2011-03-17 DIAGNOSIS — Z170 Estrogen receptor positive status [ER+]: Secondary | ICD-10-CM

## 2011-03-17 DIAGNOSIS — C50919 Malignant neoplasm of unspecified site of unspecified female breast: Secondary | ICD-10-CM

## 2011-03-17 DIAGNOSIS — C50319 Malignant neoplasm of lower-inner quadrant of unspecified female breast: Secondary | ICD-10-CM

## 2011-03-17 DIAGNOSIS — Z5189 Encounter for other specified aftercare: Secondary | ICD-10-CM

## 2011-03-17 MED ORDER — PEGFILGRASTIM INJECTION 6 MG/0.6ML
6.0000 mg | Freq: Once | SUBCUTANEOUS | Status: AC
  Administered 2011-03-17: 6 mg via SUBCUTANEOUS
  Filled 2011-03-17: qty 0.6

## 2011-03-21 ENCOUNTER — Telehealth: Payer: Self-pay | Admitting: *Deleted

## 2011-03-21 NOTE — Telephone Encounter (Signed)
Pt called to this RN for results of recent U/A. Discussed results with [name] stating concern with ongoing perineal discomfort since above is negative.  Per inquiry pt state perineal area is irritated and tender. No lesions are apparent. Pt did state history of having recurring yeast infections with use of birth control pills. " it would occur the week before my period was to start and then lessen when my period occurred but I would often need diflucan "  The above will be reviewed with AB/PA.  Pt also wanted [name] to know how her energy level was post chemo this time. On day post chemo - she was able to go out for a couple of hours but by late afternoon was tired " and shuffling like an old person ". Saturday - " didn't leave the house "-  [name] also states noted fluid retention from Sunday pm to Monday- with noted weight loss of 5lbs. " Monday morning I peed a pond and by mid day when I weighed there was a 5lb difference from Sunday night " " I felt a lot better by then as well " [name] also states intermittant episodes of " light headedness ".  This note will be reviewed with AB/PA.  Return call number for pt is [phone].  Pt uses [facility] in [phone].

## 2011-03-22 ENCOUNTER — Other Ambulatory Visit: Payer: Self-pay | Admitting: *Deleted

## 2011-03-22 DIAGNOSIS — C801 Malignant (primary) neoplasm, unspecified: Secondary | ICD-10-CM

## 2011-03-22 MED ORDER — FLUCONAZOLE 100 MG PO TABS: 100.0000 mg | ORAL_TABLET | Freq: Every day | ORAL | Status: AC

## 2011-03-22 NOTE — Telephone Encounter (Signed)
Called pt. And let her know diflucan called in to [name].  Amy Allyson Sabal PA will discuss further at appt. Tomorrow.

## 2011-03-23 ENCOUNTER — Other Ambulatory Visit: Payer: Self-pay | Admitting: Oncology

## 2011-03-23 ENCOUNTER — Other Ambulatory Visit (HOSPITAL_BASED_OUTPATIENT_CLINIC_OR_DEPARTMENT_OTHER): Payer: BC Managed Care – PPO | Admitting: Lab

## 2011-03-23 ENCOUNTER — Encounter: Payer: Self-pay | Admitting: Physician Assistant

## 2011-03-23 ENCOUNTER — Ambulatory Visit (HOSPITAL_BASED_OUTPATIENT_CLINIC_OR_DEPARTMENT_OTHER): Payer: BC Managed Care – PPO | Admitting: Physician Assistant

## 2011-03-23 VITALS — BP 96/66 | HR 91 | Temp 97.7°F | Ht 63.5 in | Wt 129.9 lb

## 2011-03-23 DIAGNOSIS — C50319 Malignant neoplasm of lower-inner quadrant of unspecified female breast: Secondary | ICD-10-CM

## 2011-03-23 DIAGNOSIS — C50919 Malignant neoplasm of unspecified site of unspecified female breast: Secondary | ICD-10-CM

## 2011-03-23 DIAGNOSIS — Z170 Estrogen receptor positive status [ER+]: Secondary | ICD-10-CM

## 2011-03-23 DIAGNOSIS — R5381 Other malaise: Secondary | ICD-10-CM

## 2011-03-23 LAB — CBC WITH DIFFERENTIAL/PLATELET
BASO%: 0.1 % (ref 0.0–2.0)
Basophils Absolute: 0 10*3/uL (ref 0.0–0.1)
EOS%: 0.1 % (ref 0.0–7.0)
Eosinophils Absolute: 0 10*3/uL (ref 0.0–0.5)
HCT: 31.1 % — ABNORMAL LOW (ref 34.8–46.6)
HGB: 10.6 g/dL — ABNORMAL LOW (ref 11.6–15.9)
LYMPH%: 24.1 % (ref 14.0–49.7)
MCH: 31.9 pg (ref 25.1–34.0)
MCHC: 34 g/dL (ref 31.5–36.0)
MCV: 93.9 fL (ref 79.5–101.0)
MONO#: 1.1 10*3/uL — ABNORMAL HIGH (ref 0.1–0.9)
MONO%: 14 % (ref 0.0–14.0)
NEUT#: 4.8 10*3/uL (ref 1.5–6.5)
NEUT%: 61.7 % (ref 38.4–76.8)
Platelets: 137 10*3/uL — ABNORMAL LOW (ref 145–400)
RBC: 3.31 10*6/uL — ABNORMAL LOW (ref 3.70–5.45)
RDW: 16.9 % — ABNORMAL HIGH (ref 11.2–14.5)
WBC: 7.8 10*3/uL (ref 3.9–10.3)
lymph#: 1.9 10*3/uL (ref 0.9–3.3)

## 2011-03-23 NOTE — Progress Notes (Signed)
Hematology and Oncology Follow Up Visit  [name] [id number] 04/28/1970 40 y.o. 03/23/2011    HPI: [name] (goes by "[name]") is a [place] woman who, at the age of 79, was referred by Dr. Coral Ceo for treatment of right breast carcinoma.  The patient palpated a mass in her right breast which she brought to Dr. Verdell Carmine attention. A prior screening mammogram on 02/06/2010 had been unremarkable. The patient was then scheduled for bilateral diagnostic mammogram and right breast ultrasonography on 12/22/2010, the studies demonstrating an ill defined mass with heterogeneous calcifications in the inner lower right breast, measuring 1.7 cm. The breasts are heterogeneously dense, but there were no other mammographic abnormalities in either breast. Clinically, there was a palpable firm mass at the 5:00 position of the right breast, 5 cm from the nipple. There were no palpable abnormalities in the right axilla. Ultrasound confirmed a 1.7 cm irregular, hypoechoic mass in the same position. There were some lymph nodes noted in the right exam of, one measuring 1.6 cm with a minimally thickened cortex inferiorly.  An ultrasound-guided core biopsy on 12/23/[phone] [id number]) showed an invasive ductal carcinoma, grade 2, ER positive at 88%, PR positive at 11 is %, HER-2/neu positive with a FISH ratio of 4.61, and an MIB-1 of 36%.  The patient was referred to Dr. Claud Kelp in bilateral breast MRIs were obtained 12/30/2010. This showed the mass in question in the right breast to measure 2.0 cm proximally. In the right exam was a 1.8 cm lymph node with a thickened cortex. The left breast was unremarkable.  The patient is currently receiving neoadjuvant chemotherapy to consist of 6 q. three-week doses of carboplatin, docetaxel, and trastuzumab, with Neulasta given on day 2 for granulocyte support.  Interim History:   Patient returns today for assessment of chemotoxicity on day  8, cycle 3 of 6 planned every 3 week doses of docetaxel carboplatin and trastuzumab. She receives Neulasta on day 2 for granulocyte support.  Overall patient is feeling well today. She did have quite a bit of weakness and fatigue following cycle 3, the worst few days being over the weekend. She had some problems with constipation for which she utilized Colace. She had a little queasy nose, but no emesis. She continued to have some urinary pressure and mild dysuria as well as some external irritation. A urinalysis last week was unremarkable with no evidence of urinary tract infection. She does have a history of vaginal yeast. She's had no fevers, chills, or night sweats. She did have some dizziness early in the week, but admits that she was not keeping herself well hydrated at that time. No abnormal headaches, no loss of consciousness.  Patient also denies any signs of peripheral neuropathy. She's had no mouth ulcers or oral sensitivity but has begun to have some taste aversion with a metallic taste. No skin changes, no bed changes, or nail bed sensitivity. No excessive tearing. No abnormal bleeding.  A detailed review of systems is otherwise noncontributory as noted below.  Review of Systems: Constitutional:  no weight loss, fever, night sweats and feels well Eyes:no  excessive tearing WGN:FAOZHYQM Cardiovascular: no chest pain or dyspnea on exertion Respiratory: no cough, shortness of breath, or wheezing Neurological: negative Dermatological: negative Gastrointestinal: constipation, no abdominal pain or black or bloody stools Genito-Urinary: positive for - dysuria and urinary frequency/urgency Hematological and Lymphatic: negative Breast: negative Musculoskeletal: negative Remaining ROS negative.  Medications: I have reviewed the patient's current medications. Current Outpatient  Prescriptions  Medication Sig Dispense Refill  . aspirin-acetaminophen-caffeine (EXCEDRIN MIGRAINE) 250-250-65  MG per tablet Take 1 tablet by mouth 1 day or 1 dose.        . cephALEXin (KEFLEX) 500 MG capsule Take 500 mg by mouth 2 (two) times daily.        [name] dexamethasone (DECADRON) 1 MG tablet Take 1 mg by mouth daily with breakfast. Takes several days prior to chemotherapy.       . fish oil-omega-3 fatty acids 1000 MG capsule Take 2 g by mouth daily.        . fluconazole (DIFLUCAN) 100 MG tablet Take 1 tablet (100 mg total) by mouth daily.  7 tablet  1  . guaiFENesin (MUCINEX) 600 MG 12 hr tablet Take 1,200 mg by mouth 2 (two) times daily.        . hydrOXYzine (ATARAX/VISTARIL) 25 MG tablet Take 25 mg by mouth 3 (three) times daily as needed.        [name] ibuprofen (ADVIL,MOTRIN) 800 MG tablet Take 800 mg by mouth every 8 (eight) hours as needed.        . lidocaine-prilocaine (EMLA) cream Apply 1 application topically as needed.      . loratadine (CLARITIN) 10 MG tablet Take 10 mg by mouth 2 (two) times daily.        [name] LORazepam (ATIVAN) 1 MG tablet Take 0.5 mg by mouth as needed.      . metoCLOPramide (REGLAN) 10 MG tablet Take 10 mg by mouth 4 (four) times daily.        . Multiple Vitamin (MULTIVITAMIN) capsule Take 1 capsule by mouth daily.        [name] omeprazole (PRILOSEC) 20 MG capsule Take 20 mg by mouth Twice daily.      . ondansetron (ZOFRAN) 4 MG tablet Take 4 mg by mouth every 8 (eight) hours as needed.        . prenatal vitamin w/FE, FA (PRENATAL 1 + 1) 27-1 MG TABS Take 1 tablet by mouth Daily.      [name] tobramycin-dexamethasone (TOBRADEX) ophthalmic solution Apply 1 drop to eye Twice daily.        Allergies:  Allergies  Allergen Reactions  . Codeine   . Morphine And Related   . Penicillins   . Tussionex Pennkinetic Er (Chlorpheniramine-Hydrocodone)     Physical Exam: Filed Vitals:   03/23/11 1527  BP: 96/66  Pulse: 91  Temp: 97.7 F (36.5 C)   HEENT:  Sclerae anicteric, conjunctivae pink.  Oropharynx clear.  No mucositis or candidiasis.   Nodes:  No cervical, supraclavicular, or  axillary lymphadenopathy palpated.  Breast Exam:  Deferred Lungs:  Clear to auscultation bilaterally.  No crackles, rhonchi, or wheezes.   Heart:  Regular rate and rhythm.   Abdomen:  Soft, nontender.  Positive bowel sounds.  No organomegaly or masses palpated.   Musculoskeletal:  No focal spinal tenderness to palpation.  Extremities:  Benign.  No peripheral edema or cyanosis.   Skin:  Benign.   Neuro:  Nonfocal.   Lab Results: Lab Results  Component Value Date   WBC 7.8 03/23/2011   HGB 10.6* 03/23/2011   HCT 31.1* 03/23/2011   MCV 93.9 03/23/2011   PLT 137* 03/23/2011   NEUTROABS 4.8 03/23/2011     Chemistry      Component Value Date/Time   NA 141 03/16/2011 1043   K 4.1 03/16/2011 1043   CL 103 03/16/2011 1043   CO2 27 03/16/2011 1043  BUN 14 03/16/2011 1043   CREATININE 0.73 03/16/2011 1043      Component Value Date/Time   CALCIUM 9.6 03/16/2011 1043   ALKPHOS 69 03/16/2011 1043   AST 21 03/16/2011 1043   ALT 30 03/16/2011 1043   BILITOT 0.5 03/16/2011 1043        Radiological Studies: No results found.   Assessment:  40 year old [place], [name] woman, status post right breast biopsy in September 2012 for a clinical 2 cm invasive ductal carcinoma, with some enlarged lymph nodes, the largest one of which was negative by biopsy. The tumor was grade 2, ER +88%, PR +11%, HER-2/neu positive, ratio of 4.61, and a proliferation marker of 36%. Currently being treated in the neoadjuvant setting, due for day 1 cycle 3 of 6 planned q. 3 week doses of docetaxel and carboplatin being given along with trastuzumab. Neulasta is given on day 2 for granulocyte support.   Plan:  Patient is already scheduled for a repeat breast MRI next week on December 17 to assess response to neoadjuvant therapy. She'll see Dr. Darnelle Catalan on the 19th to review those results. I will see her back in 2 weeks, December 27, in anticipation of her fourth cycle of chemotherapy.  The patient has a prescription  for Diflucan which she will take for possible vaginal yeast. Thousand crit her to drink plenty of fluids, and keep herself well hydrated.   This plan was reviewed with the patient, who voices understanding and agreement.  She knows to call with any changes or problems.    Addy Mcmannis, PA-C 03/23/2011

## 2011-03-27 ENCOUNTER — Ambulatory Visit
Admission: RE | Admit: 2011-03-27 | Discharge: 2011-03-27 | Disposition: A | Discharge status: Home / Self Care | Payer: BC Managed Care – PPO | Source: Ambulatory Visit | Attending: Physician Assistant | Admitting: Physician Assistant

## 2011-03-27 DIAGNOSIS — C50919 Malignant neoplasm of unspecified site of unspecified female breast: Secondary | ICD-10-CM

## 2011-03-27 MED ORDER — GADOBENATE DIMEGLUMINE 529 MG/ML IV SOLN: 12.0000 mL | Freq: Once | INTRAVENOUS | Status: AC | PRN

## 2011-03-29 ENCOUNTER — Ambulatory Visit (HOSPITAL_BASED_OUTPATIENT_CLINIC_OR_DEPARTMENT_OTHER): Payer: BC Managed Care – PPO | Admitting: Oncology

## 2011-03-29 ENCOUNTER — Other Ambulatory Visit (HOSPITAL_BASED_OUTPATIENT_CLINIC_OR_DEPARTMENT_OTHER): Payer: BC Managed Care – PPO | Admitting: Lab

## 2011-03-29 VITALS — BP 113/69 | HR 76 | Temp 98.1°F | Ht 63.5 in | Wt 130.5 lb

## 2011-03-29 DIAGNOSIS — Z79899 Other long term (current) drug therapy: Secondary | ICD-10-CM

## 2011-03-29 DIAGNOSIS — C50319 Malignant neoplasm of lower-inner quadrant of unspecified female breast: Secondary | ICD-10-CM

## 2011-03-29 DIAGNOSIS — C50919 Malignant neoplasm of unspecified site of unspecified female breast: Secondary | ICD-10-CM

## 2011-03-29 DIAGNOSIS — Z170 Estrogen receptor positive status [ER+]: Secondary | ICD-10-CM

## 2011-03-29 LAB — CBC WITH DIFFERENTIAL/PLATELET
BASO%: 0.2 % (ref 0.0–2.0)
Basophils Absolute: 0 10*3/uL (ref 0.0–0.1)
EOS%: 0.3 % (ref 0.0–7.0)
Eosinophils Absolute: 0 10*3/uL (ref 0.0–0.5)
HCT: 32.8 % — ABNORMAL LOW (ref 34.8–46.6)
HGB: 11.3 g/dL — ABNORMAL LOW (ref 11.6–15.9)
LYMPH%: 16.5 % (ref 14.0–49.7)
MCH: 32.7 pg (ref 25.1–34.0)
MCHC: 34.4 g/dL (ref 31.5–36.0)
MCV: 94.9 fL (ref 79.5–101.0)
MONO#: 0.5 10*3/uL (ref 0.1–0.9)
MONO%: 4.6 % (ref 0.0–14.0)
NEUT#: 8.3 10*3/uL — ABNORMAL HIGH (ref 1.5–6.5)
NEUT%: 78.4 % — ABNORMAL HIGH (ref 38.4–76.8)
Platelets: 190 10*3/uL (ref 145–400)
RBC: 3.46 10*6/uL — ABNORMAL LOW (ref 3.70–5.45)
RDW: 18.4 % — ABNORMAL HIGH (ref 11.2–14.5)
WBC: 10.6 10*3/uL — ABNORMAL HIGH (ref 3.9–10.3)
lymph#: 1.7 10*3/uL (ref 0.9–3.3)

## 2011-04-02 NOTE — Progress Notes (Signed)
Hematology and Oncology Follow Up Visit  [name] [id number] Dec 28, 1970 40 y.o. 04/02/2011    HPI: [name] (goes by "[name]") is a [place] woman who, at the age of 58, was referred by Dr. Coral Ceo for treatment of right breast carcinoma.  The patient palpated a mass in her right breast which she brought to Dr. Verdell Carmine attention. A prior screening mammogram on 02/06/2010 had been unremarkable. The patient was then scheduled for bilateral diagnostic mammogram and right breast ultrasonography on 12/22/2010, the studies demonstrating an ill defined mass with heterogeneous calcifications in the inner lower right breast, measuring 1.7 cm. The breasts are heterogeneously dense, but there were no other mammographic abnormalities in either breast. Clinically, there was a palpable firm mass at the 5:00 position of the right breast, 5 cm from the nipple. There were no palpable abnormalities in the right axilla. Ultrasound confirmed a 1.7 cm irregular, hypoechoic mass in the same position. There were some lymph nodes noted in the right exam of, one measuring 1.6 cm with a minimally thickened cortex inferiorly.  An ultrasound-guided core biopsy on 12/23/2010 [phone]) showed an invasive ductal carcinoma, grade 2, ER positive at 88%, PR positive at 11 is %, HER-2/neu positive with a FISH ratio of 4.61, and an MIB-1 of 36%.  The patient was referred to Dr. Claud Kelp in bilateral breast MRIs were obtained 12/30/2010. This showed the mass in question in the right breast to measure 2.0 cm proximally. In the right exam was a 1.8 cm lymph node with a thickened cortex. The left breast was unremarkable.  The patient was seen in the multidisciplinary breast cancer clinic and neoadjuvant chemotherapy was decided upon, to consist of 6 q. three-week cycles of carboplatin, docetaxel, and trastuzumab, with Neulasta given on day 2 for granulocyte support.  Interim History:   The  patient returns for reassessment day 14 cycle 3 of her six planned cycles of chemotherapy. Generally she is tolerating treatment well. Since the last visit here she has had a breast MRI for initial response assessment--this is discussed below.  Review of Systems: She complains of mild fatigue. She's had a little bit of her running nose and some sinus problems. She has a sore throat and some hoarseness, but and minimal cough, which is dry and not pleuritic. She has been constipated and also had some pain with urination, without hematuria or frequency. She describes herself as forgetful. She does treated on Thursday generally does well until Saturday when she mostly stays at home. Sunday she starts feeling a little bit better Monday she is back to work. A detailed review of systems was otherwise noncontributory.  Medications: I have reviewed the patient's current medications. Current Outpatient Prescriptions  Medication Sig Dispense Refill  . aspirin-acetaminophen-caffeine (EXCEDRIN MIGRAINE) 250-250-65 MG per tablet Take 1 tablet by mouth 1 day or 1 dose.        [name] dexamethasone (DECADRON) 1 MG tablet Take 1 mg by mouth daily with breakfast. Takes several days prior to chemotherapy.       [name] guaiFENesin (MUCINEX) 600 MG 12 hr tablet Take 1,200 mg by mouth 2 (two) times daily.        [name] ibuprofen (ADVIL,MOTRIN) 800 MG tablet Take 800 mg by mouth every 8 (eight) hours as needed.        . lidocaine-prilocaine (EMLA) cream Apply 1 application topically as needed.      . loratadine (CLARITIN) 10 MG tablet Take 10 mg by mouth 2 (two)  times daily.        [name] LORazepam (ATIVAN) 1 MG tablet Take 0.5 mg by mouth as needed.      . metoCLOPramide (REGLAN) 10 MG tablet Take 10 mg by mouth 4 (four) times daily.        . Multiple Vitamin (MULTIVITAMIN) capsule Take 1 capsule by mouth daily.        [name] omeprazole (PRILOSEC) 20 MG capsule Take 20 mg by mouth Twice daily.      . ondansetron (ZOFRAN) 4 MG tablet Take 4 mg by  mouth every 8 (eight) hours as needed.        . prenatal vitamin w/FE, FA (PRENATAL 1 + 1) 27-1 MG TABS Take 1 tablet by mouth Daily.      [name] tobramycin-dexamethasone (TOBRADEX) ophthalmic solution Apply 1 drop to eye Twice daily.      . fish oil-omega-3 fatty acids 1000 MG capsule Take 2 g by mouth daily.        . hydrOXYzine (ATARAX/VISTARIL) 25 MG tablet Take 25 mg by mouth 3 (three) times daily as needed.          Allergies:  Allergies  Allergen Reactions  . Codeine   . Morphine And Related   . Penicillins   . Tussionex Pennkinetic Er (Chlorpheniramine-Hydrocodone)     Physical Exam: Filed Vitals:   03/29/11 1536  BP: 113/69  Pulse: 76  Temp: 98.1 F (36.7 C)  Body mass index is 22.75 kg/(m^2).  HEENT:  Sclerae anicteric, conjunctivae pink.  Oropharynx clear.  No mucositis or candidiasis.   Nodes:  No cervical, supraclavicular, or axillary lymphadenopathy palpated.  Breast Exam:  I do not palpate a definite mass in the right breast. There is no skin change or nipple retraction. The left breast is unremarkable. Lungs:  Clear to auscultation bilaterally.  No crackles, rhonchi, or wheezes.   Heart:  Regular rate and rhythm.   Abdomen:  Soft, nontender.  Positive bowel sounds.  No organomegaly or masses palpated.   Musculoskeletal:  No focal spinal tenderness to palpation.  Extremities:  Benign.  No peripheral edema or cyanosis.   Neuro:  Nonfocal. Skin: port is intact   Lab Results: Lab Results  Component Value Date   WBC 10.6* 03/29/2011   HGB 11.3* 03/29/2011   HCT 32.8* 03/29/2011   MCV 94.9 03/29/2011   PLT 190 03/29/2011   NEUTROABS 8.3* 03/29/2011     Chemistry      Component Value Date/Time   NA 141 03/16/2011 1043   K 4.1 03/16/2011 1043   CL 103 03/16/2011 1043   CO2 27 03/16/2011 1043   BUN 14 03/16/2011 1043   CREATININE 0.73 03/16/2011 1043      Component Value Date/Time   CALCIUM 9.6 03/16/2011 1043   ALKPHOS 69 03/16/2011 1043   AST 21 03/16/2011 1043     ALT 30 03/16/2011 1043   BILITOT 0.5 03/16/2011 1043        Radiological Studies: BILATERAL BREAST MRI WITH AND WITHOUT CONTRAST 03/27/2011 .  Comparison: Bilateral breast MRI September 2012  Findings: There is a mild background parenchymal enhancement  pattern bilaterally. The biopsy-proven carcinoma in the lower  inner quadrant of the right breast, posterior third, enhances to a  much lesser degree on today's study. It is nearly occult on the 20-  second postcontrast images, but increases in enhancement over time.  It demonstrates low-level progressive enhancement. This spiculated  mass currently measures 1.0 x 1.2 x 0.8 cm (previously  1.5 x 1.3 x  2.0 cm in September 2012). There is central biopsy clip artifact.  No new areas of enhancement are identified in the right breast.  There is no abnormal enhancement of the pectoralis muscle.  There is no mass or suspicious enhancement in the left breast.  No axillary or internal mammary chain lymphadenopathy is  identified.  IMPRESSION:  Positive response to neoadjuvant therapy. Biopsy-proven malignancy  in the lower inner quadrant of the right breast is significantly  decreased in enhancement and is decreased in size. Current maximal  dimension is 1.0 cm.     Assessment:  40 year old [place], [name] woman, status post right breast biopsy in September 2012 for a clinically 2 cm invasive ductal carcinoma, with some enlarged Right axillary lymph nodes, the largest one of which was negative by biopsy. The tumor was grade 2, estrogen receptor positive at 88%, progesterone receptor positive at 11%, HER-2/neu positive by CISH with a ratio of 4.61, and a proliferation marker of 36%. Currently being treated in the neoadjuvant setting with Q 3 week docetaxel/ carboplatin/ trastuzumab.   Plan:  The MRI shows a good initial response to treatment, now halfway into her chemotherapy. I making no changes in her doses or treatment intervals. I am  suggesting she take her dexamethasone days 0 and then days 23 and 4. I think this will help her get over her symptoms more quickly. She'll also take Diflucan whenever she receives ciprofloxacin for neutropenic prophylaxis. She really has her next appointment and next treatment here scheduled for December 27. She knows to call for any problems that may develop before the next visit.   Lowella Dell, PA-C 04/02/2011

## 2011-04-06 ENCOUNTER — Ambulatory Visit (HOSPITAL_BASED_OUTPATIENT_CLINIC_OR_DEPARTMENT_OTHER): Payer: BC Managed Care – PPO | Admitting: Physician Assistant

## 2011-04-06 ENCOUNTER — Other Ambulatory Visit: Payer: Self-pay | Admitting: Oncology

## 2011-04-06 ENCOUNTER — Other Ambulatory Visit (HOSPITAL_BASED_OUTPATIENT_CLINIC_OR_DEPARTMENT_OTHER): Payer: BC Managed Care – PPO | Admitting: Lab

## 2011-04-06 ENCOUNTER — Other Ambulatory Visit (HOSPITAL_BASED_OUTPATIENT_CLINIC_OR_DEPARTMENT_OTHER): Payer: BC Managed Care – PPO | Admitting: Physician Assistant

## 2011-04-06 ENCOUNTER — Encounter: Payer: Self-pay | Admitting: Physician Assistant

## 2011-04-06 ENCOUNTER — Ambulatory Visit (HOSPITAL_BASED_OUTPATIENT_CLINIC_OR_DEPARTMENT_OTHER): Payer: BC Managed Care – PPO

## 2011-04-06 DIAGNOSIS — Z79899 Other long term (current) drug therapy: Secondary | ICD-10-CM

## 2011-04-06 DIAGNOSIS — N390 Urinary tract infection, site not specified: Secondary | ICD-10-CM

## 2011-04-06 DIAGNOSIS — C50319 Malignant neoplasm of lower-inner quadrant of unspecified female breast: Secondary | ICD-10-CM

## 2011-04-06 DIAGNOSIS — N63 Unspecified lump in unspecified breast: Secondary | ICD-10-CM

## 2011-04-06 DIAGNOSIS — Z5111 Encounter for antineoplastic chemotherapy: Secondary | ICD-10-CM

## 2011-04-06 DIAGNOSIS — R300 Dysuria: Secondary | ICD-10-CM

## 2011-04-06 DIAGNOSIS — Z170 Estrogen receptor positive status [ER+]: Secondary | ICD-10-CM

## 2011-04-06 DIAGNOSIS — Z5112 Encounter for antineoplastic immunotherapy: Secondary | ICD-10-CM

## 2011-04-06 DIAGNOSIS — C50919 Malignant neoplasm of unspecified site of unspecified female breast: Secondary | ICD-10-CM

## 2011-04-06 LAB — CBC WITH DIFFERENTIAL/PLATELET
BASO%: 0.1 % (ref 0.0–2.0)
Basophils Absolute: 0 10*3/uL (ref 0.0–0.1)
EOS%: 0 % (ref 0.0–7.0)
Eosinophils Absolute: 0 10*3/uL (ref 0.0–0.5)
HCT: 33.4 % — ABNORMAL LOW (ref 34.8–46.6)
HGB: 11.4 g/dL — ABNORMAL LOW (ref 11.6–15.9)
LYMPH%: 8 % — ABNORMAL LOW (ref 14.0–49.7)
MCH: 33.2 pg (ref 25.1–34.0)
MCHC: 34.2 g/dL (ref 31.5–36.0)
MCV: 97.2 fL (ref 79.5–101.0)
MONO#: 0.7 10*3/uL (ref 0.1–0.9)
MONO%: 6.7 % (ref 0.0–14.0)
NEUT#: 9.1 10*3/uL — ABNORMAL HIGH (ref 1.5–6.5)
NEUT%: 85.2 % — ABNORMAL HIGH (ref 38.4–76.8)
Platelets: 177 10*3/uL (ref 145–400)
RBC: 3.44 10*6/uL — ABNORMAL LOW (ref 3.70–5.45)
RDW: 19.8 % — ABNORMAL HIGH (ref 11.2–14.5)
WBC: 10.7 10*3/uL — ABNORMAL HIGH (ref 3.9–10.3)
lymph#: 0.9 10*3/uL (ref 0.9–3.3)

## 2011-04-06 LAB — URINALYSIS, MICROSCOPIC - CHCC
Bilirubin (Urine): NEGATIVE
Blood: NEGATIVE
Glucose: NEGATIVE g/dL
Ketones: NEGATIVE mg/dL
Leukocyte Esterase: NEGATIVE
Nitrite: NEGATIVE
Protein: NEGATIVE mg/dL
RBC count: NEGATIVE (ref 0–2)
Specific Gravity, Urine: 1.01 (ref 1.003–1.035)
WBC, UA: NEGATIVE (ref 0–2)
pH: 6.5 (ref 4.6–8.0)

## 2011-04-06 LAB — COMPREHENSIVE METABOLIC PANEL
ALT: 35 U/L (ref 0–35)
AST: 30 U/L (ref 0–37)
Albumin: 4.6 g/dL (ref 3.5–5.2)
Alkaline Phosphatase: 60 U/L (ref 39–117)
BUN: 13 mg/dL (ref 6–23)
CO2: 27 mEq/L (ref 19–32)
Calcium: 10 mg/dL (ref 8.4–10.5)
Chloride: 104 mEq/L (ref 96–112)
Creatinine, Ser: 0.69 mg/dL (ref 0.50–1.10)
Glucose, Bld: 93 mg/dL (ref 70–99)
Potassium: 4.1 mEq/L (ref 3.5–5.3)
Sodium: 140 mEq/L (ref 135–145)
Total Bilirubin: 0.7 mg/dL (ref 0.3–1.2)
Total Protein: 6.3 g/dL (ref 6.0–8.3)

## 2011-04-06 MED ORDER — DOCETAXEL CHEMO INJECTION 160 MG/16ML
75.0000 mg/m2 | Freq: Once | INTRAVENOUS | Status: AC
  Administered 2011-04-06: 120 mg via INTRAVENOUS
  Filled 2011-04-06: qty 12

## 2011-04-06 MED ORDER — DIPHENHYDRAMINE HCL 25 MG PO CAPS
25.0000 mg | ORAL_CAPSULE | Freq: Once | ORAL | Status: AC
  Administered 2011-04-06: 25 mg via ORAL

## 2011-04-06 MED ORDER — SODIUM CHLORIDE 0.9 % IV SOLN
6.0000 mg/kg | Freq: Once | INTRAVENOUS | Status: AC
  Administered 2011-04-06: 357 mg via INTRAVENOUS
  Filled 2011-04-06: qty 17

## 2011-04-06 MED ORDER — LORAZEPAM 2 MG/ML IJ SOLN
0.5000 mg | Freq: Once | INTRAMUSCULAR | Status: AC
  Administered 2011-04-06: 0.5 mg via INTRAVENOUS

## 2011-04-06 MED ORDER — ACETAMINOPHEN 325 MG PO TABS
650.0000 mg | ORAL_TABLET | Freq: Once | ORAL | Status: AC
  Administered 2011-04-06: 650 mg via ORAL

## 2011-04-06 MED ORDER — SODIUM CHLORIDE 0.9 % IV SOLN
751.8000 mg | Freq: Once | INTRAVENOUS | Status: AC
  Administered 2011-04-06: 750 mg via INTRAVENOUS
  Filled 2011-04-06: qty 75

## 2011-04-06 MED ORDER — SODIUM CHLORIDE 0.9 % IV SOLN: Freq: Once | INTRAVENOUS | Status: DC

## 2011-04-06 MED ORDER — ONDANSETRON 16 MG/50ML IVPB (CHCC)
16.0000 mg | Freq: Once | INTRAVENOUS | Status: AC
  Administered 2011-04-06: 16 mg via INTRAVENOUS

## 2011-04-06 MED ORDER — DEXAMETHASONE SODIUM PHOSPHATE 4 MG/ML IJ SOLN
20.0000 mg | Freq: Once | INTRAMUSCULAR | Status: AC
  Administered 2011-04-06: 20 mg via INTRAVENOUS

## 2011-04-06 NOTE — Patient Instructions (Signed)
Patient ambulatory out of clinic with family members.  Patient still complains of burning and irritation when urinating, reiterated to patient to increase fluid intake.  Instructed patient to call with any issues. Patient aware of next appointment

## 2011-04-06 NOTE — Progress Notes (Signed)
Hematology and Oncology Follow Up Visit  [name] [id number] Apr 13, 1970 40 y.o. 04/06/2011    HPI: [name] (goes by "[name]") is a [place] woman who, at the age of 42, was referred by Dr. Coral Ceo for treatment of right breast carcinoma.  The patient palpated a mass in her right breast which she brought to Dr. Verdell Carmine attention. A prior screening mammogram on 02/06/2010 had been unremarkable. The patient was then scheduled for bilateral diagnostic mammogram and right breast ultrasonography on 12/22/2010, the studies demonstrating an ill defined mass with heterogeneous calcifications in the inner lower right breast, measuring 1.7 cm. The breasts are heterogeneously dense, but there were no other mammographic abnormalities in either breast. Clinically, there was a palpable firm mass at the 5:00 position of the right breast, 5 cm from the nipple. There were no palpable abnormalities in the right axilla. Ultrasound confirmed a 1.7 cm irregular, hypoechoic mass in the same position. There were some lymph nodes noted in the right exam of, one measuring 1.6 cm with a minimally thickened cortex inferiorly.  An ultrasound-guided core biopsy on 12/23/2010 [phone]) showed an invasive ductal carcinoma, grade 2, ER positive at 88%, PR positive at 11 is %, HER-2/neu positive with a FISH ratio of 4.61, and an MIB-1 of 36%.  The patient was referred to Dr. Claud Kelp in bilateral breast MRIs were obtained 12/30/2010. This showed the mass in question in the right breast to measure 2.0 cm proximally. In the right exam was a 1.8 cm lymph node with a thickened cortex. The left breast was unremarkable.  The patient was seen in the multidisciplinary breast cancer clinic and neoadjuvant chemotherapy was decided upon, to consist of 6 q. three-week cycles of carboplatin, docetaxel, and trastuzumab, with Neulasta given on day 2 for granulocyte support.  Interim History:    Patient returns today accompanied by her husband for followup in anticipation of day 1 cycle 4 of 6 planned every 3 week doses of docetaxel/carboplatin/trastuzumab. She receives Neulasta on day 2 for granulocyte support.  Patient is feeling reasonably well today. She has had a little bit of a runny nose and some sinus congestion. She denies sore throat, has had no cough, shortness of breath, fevers, chills, or night sweats. Her biggest complaint is continued to be some discomfort with urination. She denies any urinary frequency or hematuria but does have mild dysuria and urgency. She feels some irritation around the urethra with urination. She's drinking water, but usually averages only 3 bottles daily. Patient has some mild constipation, treated appropriately with MiraLAX. She denies any nausea, emesis, or decreased appetite. She's had no signs of peripheral neuropathy.  A detailed review of systems is otherwise noncontributory as noted below.  Review of Systems: Constitutional: fatigue, no weight loss, fever, night sweats Eyes: negative QIO:NGEXB congestion and sinus pain Cardiovascular: no chest pain or dyspnea on exertion Respiratory: no cough, shortness of breath, or wheezing Neurological: negative Dermatological: negative Gastrointestinal: no abdominal pain, change in bowel habits, or black or bloody stools Genito-Urinary: positive for - dysuria negative for - hematuria, incontinence or urinary frequency/urgency Hematological and Lymphatic: negative Breast: negative Musculoskeletal: negative Remaining ROS negative.    Medications: I have reviewed the patient's current medications. Current Outpatient Prescriptions  Medication Sig Dispense Refill  . aspirin-acetaminophen-caffeine (EXCEDRIN MIGRAINE) 250-250-65 MG per tablet Take 1 tablet by mouth 1 day or 1 dose.        [name] dexamethasone (DECADRON) 1 MG tablet Take 1 mg by mouth  daily with breakfast. Takes several days prior to  chemotherapy.       . fish oil-omega-3 fatty acids 1000 MG capsule Take 2 g by mouth daily.        [name] guaiFENesin (MUCINEX) 600 MG 12 hr tablet Take 1,200 mg by mouth 2 (two) times daily.        . hydrOXYzine (ATARAX/VISTARIL) 25 MG tablet Take 25 mg by mouth 3 (three) times daily as needed.        [name] ibuprofen (ADVIL,MOTRIN) 800 MG tablet Take 800 mg by mouth every 8 (eight) hours as needed.        . lidocaine-prilocaine (EMLA) cream Apply 1 application topically as needed.      . loratadine (CLARITIN) 10 MG tablet Take 10 mg by mouth 2 (two) times daily.        [name] LORazepam (ATIVAN) 1 MG tablet Take 0.5 mg by mouth as needed.      . metoCLOPramide (REGLAN) 10 MG tablet Take 10 mg by mouth 4 (four) times daily.        . Multiple Vitamin (MULTIVITAMIN) capsule Take 1 capsule by mouth daily.        [name] omeprazole (PRILOSEC) 20 MG capsule Take 20 mg by mouth Twice daily.      . ondansetron (ZOFRAN) 4 MG tablet Take 4 mg by mouth every 8 (eight) hours as needed.        . prenatal vitamin w/FE, FA (PRENATAL 1 + 1) 27-1 MG TABS Take 1 tablet by mouth Daily.      [name] tobramycin-dexamethasone (TOBRADEX) ophthalmic solution Apply 1 drop to eye Twice daily.        Allergies:  Allergies  Allergen Reactions  . Codeine   . Morphine And Related   . Penicillins   . Tussionex Pennkinetic Er (Chlorpheniramine-Hydrocodone)     Physical Exam: Filed Vitals:   04/06/11 1112  BP: 108/70  Pulse: 86  Temp: 97.8 F (36.6 C)  Body mass index is 22.98 kg/(m^2).  HEENT:  Sclerae anicteric, conjunctivae pink.  Oropharynx clear.  No mucositis or candidiasis.   Nodes:  No cervical, supraclavicular, or axillary lymphadenopathy palpated.  Breast Exam: Deferred. Lungs:  Clear to auscultation bilaterally.  No crackles, rhonchi, or wheezes.   Heart:  Regular rate and rhythm.   Abdomen:  Soft, nontender.  Positive bowel sounds.  No organomegaly or masses palpated.   Musculoskeletal:  No focal spinal tenderness to  palpation.  Extremities:  Benign.  No peripheral edema or cyanosis.   Neuro:  Nonfocal. Skin: port is intact. Skin is benign.   Lab Results: Lab Results  Component Value Date   WBC 10.7* 04/06/2011   HGB 11.4* 04/06/2011   HCT 33.4* 04/06/2011   MCV 97.2 04/06/2011   PLT 177 04/06/2011   NEUTROABS 9.1* 04/06/2011     Chemistry      Component Value Date/Time   NA 141 03/16/2011 1043   K 4.1 03/16/2011 1043   CL 103 03/16/2011 1043   CO2 27 03/16/2011 1043   BUN 14 03/16/2011 1043   CREATININE 0.73 03/16/2011 1043      Component Value Date/Time   CALCIUM 9.6 03/16/2011 1043   ALKPHOS 69 03/16/2011 1043   AST 21 03/16/2011 1043   ALT 30 03/16/2011 1043   BILITOT 0.5 03/16/2011 1043        Radiological Studies: BILATERAL BREAST MRI WITH AND WITHOUT CONTRAST 03/27/2011 .  Comparison: Bilateral breast MRI September 2012  Findings: There is  a mild background parenchymal enhancement  pattern bilaterally. The biopsy-proven carcinoma in the lower  inner quadrant of the right breast, posterior third, enhances to a  much lesser degree on today's study. It is nearly occult on the 20-  second postcontrast images, but increases in enhancement over time.  It demonstrates low-level progressive enhancement. This spiculated  mass currently measures 1.0 x 1.2 x 0.8 cm (previously 1.5 x 1.3 x  2.0 cm in September 2012). There is central biopsy clip artifact.  No new areas of enhancement are identified in the right breast.  There is no abnormal enhancement of the pectoralis muscle.  There is no mass or suspicious enhancement in the left breast.  No axillary or internal mammary chain lymphadenopathy is  identified.  IMPRESSION:  Positive response to neoadjuvant therapy. Biopsy-proven malignancy  in the lower inner quadrant of the right breast is significantly  decreased in enhancement and is decreased in size. Current maximal  dimension is 1.0 cm.     Assessment:  40 year old  [place], [name] woman, status post right breast biopsy in September 2012 for a clinically 2 cm invasive ductal carcinoma, with some enlarged Right axillary lymph nodes, the largest one of which was negative by biopsy. The tumor was grade 2, estrogen receptor positive at 88%, progesterone receptor positive at 11%, HER-2/neu positive by CISH with a ratio of 4.61, and a proliferation marker of 36%. Currently being treated in the neoadjuvant setting with Q 3 week docetaxel/ carboplatin/ trastuzumab.   Plan:  Regards to chemotherapy, the patient is tolerating things quite well. She will proceed to cycle 4 today, with Neulasta given tomorrow on day 2. We are going to obtain a urinalysis and urine culture to evaluate for possible urinary tract infection. Also reiterated to the patient the importance of keeping herself very well hydrated to do with the urine is much as possible.  I will see her again next week on January 3 for assessment chemotoxicity.   Both the patient and her husband voice understanding and agreement with this plan, and will call with any changes or problems.   Martrice Apt, PA-C 04/06/2011

## 2011-04-07 ENCOUNTER — Ambulatory Visit (HOSPITAL_BASED_OUTPATIENT_CLINIC_OR_DEPARTMENT_OTHER): Payer: BC Managed Care – PPO

## 2011-04-07 VITALS — BP 98/62 | HR 69 | Temp 98.5°F

## 2011-04-07 DIAGNOSIS — N63 Unspecified lump in unspecified breast: Secondary | ICD-10-CM

## 2011-04-07 DIAGNOSIS — Z5189 Encounter for other specified aftercare: Secondary | ICD-10-CM

## 2011-04-07 DIAGNOSIS — C50319 Malignant neoplasm of lower-inner quadrant of unspecified female breast: Secondary | ICD-10-CM

## 2011-04-07 MED ORDER — PEGFILGRASTIM INJECTION 6 MG/0.6ML
6.0000 mg | Freq: Once | SUBCUTANEOUS | Status: AC
  Administered 2011-04-07: 6 mg via SUBCUTANEOUS
  Filled 2011-04-07: qty 0.6

## 2011-04-13 ENCOUNTER — Encounter: Payer: Self-pay | Admitting: Physician Assistant

## 2011-04-13 ENCOUNTER — Ambulatory Visit (HOSPITAL_BASED_OUTPATIENT_CLINIC_OR_DEPARTMENT_OTHER): Payer: BC Managed Care – PPO | Admitting: Physician Assistant

## 2011-04-13 ENCOUNTER — Other Ambulatory Visit (HOSPITAL_BASED_OUTPATIENT_CLINIC_OR_DEPARTMENT_OTHER): Payer: BC Managed Care – PPO | Admitting: Lab

## 2011-04-13 VITALS — BP 102/71 | HR 94 | Temp 98.4°F | Ht 63.5 in | Wt 129.8 lb

## 2011-04-13 DIAGNOSIS — Z79899 Other long term (current) drug therapy: Secondary | ICD-10-CM

## 2011-04-13 DIAGNOSIS — C50919 Malignant neoplasm of unspecified site of unspecified female breast: Secondary | ICD-10-CM

## 2011-04-13 DIAGNOSIS — Z170 Estrogen receptor positive status [ER+]: Secondary | ICD-10-CM

## 2011-04-13 DIAGNOSIS — IMO0001 Reserved for inherently not codable concepts without codable children: Secondary | ICD-10-CM

## 2011-04-13 LAB — CBC WITH DIFFERENTIAL/PLATELET
BASO%: 0.3 % (ref 0.0–2.0)
Basophils Absolute: 0 10*3/uL (ref 0.0–0.1)
EOS%: 0.3 % (ref 0.0–7.0)
Eosinophils Absolute: 0 10*3/uL (ref 0.0–0.5)
HCT: 33.6 % — ABNORMAL LOW (ref 34.8–46.6)
HGB: 11.6 g/dL (ref 11.6–15.9)
LYMPH%: 30.4 % (ref 14.0–49.7)
MCH: 33.7 pg (ref 25.1–34.0)
MCHC: 34.6 g/dL (ref 31.5–36.0)
MCV: 97.4 fL (ref 79.5–101.0)
MONO#: 0.9 10*3/uL (ref 0.1–0.9)
MONO%: 14.5 % — ABNORMAL HIGH (ref 0.0–14.0)
NEUT#: 3.4 10*3/uL (ref 1.5–6.5)
NEUT%: 54.5 % (ref 38.4–76.8)
Platelets: 114 10*3/uL — ABNORMAL LOW (ref 145–400)
RBC: 3.45 10*6/uL — ABNORMAL LOW (ref 3.70–5.45)
RDW: 19.5 % — ABNORMAL HIGH (ref 11.2–14.5)
WBC: 6.3 10*3/uL (ref 3.9–10.3)
lymph#: 1.9 10*3/uL (ref 0.9–3.3)

## 2011-04-13 MED ORDER — IBUPROFEN 800 MG PO TABS: 800.0000 mg | ORAL_TABLET | Freq: Three times a day (TID) | ORAL | Status: AC | PRN

## 2011-04-13 NOTE — Progress Notes (Signed)
Hematology and Oncology Follow Up Visit  [name] [id number] 07/11/1970 41 y.o. 04/13/2011    HPI: [name] (goes by "[name]") is a [place] woman who, at the age of 30, was referred by Dr. Coral Ceo for treatment of right breast carcinoma.  The patient palpated a mass in her right breast which she brought to Dr. Verdell Carmine attention. A prior screening mammogram on 02/06/2010 had been unremarkable. The patient was then scheduled for bilateral diagnostic mammogram and right breast ultrasonography on 12/22/2010, the studies demonstrating an ill defined mass with heterogeneous calcifications in the inner lower right breast, measuring 1.7 cm. The breasts are heterogeneously dense, but there were no other mammographic abnormalities in either breast. Clinically, there was a palpable firm mass at the 5:00 position of the right breast, 5 cm from the nipple. There were no palpable abnormalities in the right axilla. Ultrasound confirmed a 1.7 cm irregular, hypoechoic mass in the same position. There were some lymph nodes noted in the right exam of, one measuring 1.6 cm with a minimally thickened cortex inferiorly.  An ultrasound-guided core biopsy on 12/23/2010 [phone]) showed an invasive ductal carcinoma, grade 2, ER positive at 88%, PR positive at 11 is %, HER-2/neu positive with a FISH ratio of 4.61, and an MIB-1 of 36%.  The patient was referred to Dr. Claud Kelp in bilateral breast MRIs were obtained 12/30/2010. This showed the mass in question in the right breast to measure 2.0 cm proximally. In the right exam was a 1.8 cm lymph node with a thickened cortex. The left breast was unremarkable.  The patient was seen in the multidisciplinary breast cancer clinic and neoadjuvant chemotherapy was decided upon, to consist of 6 q. three-week cycles of carboplatin, docetaxel, and trastuzumab, with Neulasta given on day 2 for granulocyte support.  Interim History:  Patient  returns today accompanied by her husband for followup in anticipation of day8 cycle 4 of 6 planned every 3 week doses of docetaxel/carboplatin/trastuzumab. She receives Neulasta on day 2 for granulocyte support.  [name] is feeling well today, but continues to be a little fatigue. She notes that took her longer to recover following cycle 4 than with previous cycles. She's doing more water. She has, however, had increased constipation which is to get adequately with MiraLAX. She continues to have some mild irritation externally around the urethra, but several urinalysis have been negative.  [name] has noted some irritation in the tips of her fingers, some redness, and slight cracking. She's using Aquaphor ointment or A. and D. ointment which does give her some relief. She's had no skin changes in the lower extremities. No nailbed changes or nail bed sensitivity. She denies any signs of numbness, tingling, or decreased sensitivity in the upper or lower extremities. She continues to have some mild sinus congestion. No excessive tearing. No mouth ulcers, oral sensitivity, or problems swallowing. She also denies any fevers or chills.  A detailed review of systems is otherwise noncontributory as noted below.  The patient and her husband did have many questions today regarding her upcoming treatment plan, and subsequent followup. In fact over half of our 40 minute appointment today was spent counseling, and coordinating care. They are interested in getting some consults with regards to her breast reconstruction, and would like to see both of our local plastic surgeons to discuss possible plans.  Review of Systems: Constitutional: fatigue, no weight loss, fever, night sweats Eyes: negative WGN:FAOZH congestion and sinus pain Cardiovascular: no chest pain or  dyspnea on exertion Respiratory: no cough, shortness of breath, or wheezing Neurological: negative Dermatological: dryness, redness, cracking on  fingertips Gastrointestinal: no abdominal pain, change in bowel habits, or black or bloody stools Genito-Urinary: positive for - dysuria negative for - hematuria, incontinence or urinary frequency/urgency Hematological and Lymphatic: negative Breast: negative Musculoskeletal: muscle pain in lower extremities Remaining ROS negative.    Medications: I have reviewed the patient's current medications. Current Outpatient Prescriptions  Medication Sig Dispense Refill  . aspirin-acetaminophen-caffeine (EXCEDRIN MIGRAINE) 250-250-65 MG per tablet Take 1 tablet by mouth 1 day or 1 dose.        [name] dexamethasone (DECADRON) 1 MG tablet Take 1 mg by mouth daily with breakfast. Takes several days prior to chemotherapy.       . fish oil-omega-3 fatty acids 1000 MG capsule Take 2 g by mouth daily.        [name] guaiFENesin (MUCINEX) 600 MG 12 hr tablet Take 1,200 mg by mouth 2 (two) times daily.        [name] ibuprofen (ADVIL,MOTRIN) 800 MG tablet Take 1 tablet (800 mg total) by mouth every 8 (eight) hours as needed for pain.  30 tablet  0  . lidocaine-prilocaine (EMLA) cream Apply 1 application topically as needed.      . loratadine (CLARITIN) 10 MG tablet Take 10 mg by mouth 2 (two) times daily.        [name] LORazepam (ATIVAN) 1 MG tablet Take 0.5 mg by mouth as needed.      . metoCLOPramide (REGLAN) 10 MG tablet Take 10 mg by mouth 4 (four) times daily.        . Multiple Vitamin (MULTIVITAMIN) capsule Take 1 capsule by mouth daily.        [name] omeprazole (PRILOSEC) 20 MG capsule Take 20 mg by mouth Twice daily.      . ondansetron (ZOFRAN) 4 MG tablet Take 4 mg by mouth every 8 (eight) hours as needed.        . prenatal vitamin w/FE, FA (PRENATAL 1 + 1) 27-1 MG TABS Take 1 tablet by mouth Daily.      [name] tobramycin-dexamethasone (TOBRADEX) ophthalmic solution Apply 1 drop to eye Twice daily.      . hydrOXYzine (ATARAX/VISTARIL) 25 MG tablet Take 25 mg by mouth 3 (three) times daily as needed.          Allergies:    Allergies  Allergen Reactions  . Codeine   . Morphine And Related   . Penicillins   . Tussionex Pennkinetic Er (Hydrocod Polst-Cpm Polst)     Physical Exam: Filed Vitals:   04/13/11 1425  BP: 102/71  Pulse: 94  Temp: 98.4 F (36.9 C)  Body mass index is 22.63 kg/(m^2).  HEENT:  Sclerae anicteric, conjunctivae pink.  Oropharynx clear.  No mucositis or candidiasis.   Nodes:  No cervical, supraclavicular, or axillary lymphadenopathy palpated.  Breast Exam: Deferred. Lungs:  Clear to auscultation bilaterally.  No crackles, rhonchi, or wheezes.   Heart:  Regular rate and rhythm.   Abdomen:  Soft, mild tenderness to palpation, primarily in upper right and left quadrants. No rebound or guarding. Positive bowel sounds.  No organomegaly or masses palpated.   Musculoskeletal:  No focal spinal tenderness to palpation.  Extremities:  Benign.  No peripheral edema or cyanosis.   Neuro:  Nonfocal. Skin: port is intact. Slight cracking noted on several of the fingertips, but no evidence of infection. Skin is otherwise benign.  Lab Results: Lab Results  Component Value Date   WBC 6.3 04/13/2011   HGB 11.6 04/13/2011   HCT 33.6* 04/13/2011   MCV 97.4 04/13/2011   PLT 114* 04/13/2011   NEUTROABS 3.4 04/13/2011     Chemistry      Component Value Date/Time   NA 140 04/06/2011 1047   K 4.1 04/06/2011 1047   CL 104 04/06/2011 1047   CO2 27 04/06/2011 1047   BUN 13 04/06/2011 1047   CREATININE 0.69 04/06/2011 1047      Component Value Date/Time   CALCIUM 10.0 04/06/2011 1047   ALKPHOS 60 04/06/2011 1047   AST 30 04/06/2011 1047   ALT 35 04/06/2011 1047   BILITOT 0.7 04/06/2011 1047        Radiological Studies: BILATERAL BREAST MRI WITH AND WITHOUT CONTRAST 03/27/2011 .  Comparison: Bilateral breast MRI September 2012  Findings: There is a mild background parenchymal enhancement  pattern bilaterally. The biopsy-proven carcinoma in the lower  inner quadrant of the right breast, posterior  third, enhances to a  much lesser degree on today's study. It is nearly occult on the 20-  second postcontrast images, but increases in enhancement over time.  It demonstrates low-level progressive enhancement. This spiculated  mass currently measures 1.0 x 1.2 x 0.8 cm (previously 1.5 x 1.3 x  2.0 cm in September 2012). There is central biopsy clip artifact.  No new areas of enhancement are identified in the right breast.  There is no abnormal enhancement of the pectoralis muscle.  There is no mass or suspicious enhancement in the left breast.  No axillary or internal mammary chain lymphadenopathy is  identified.  IMPRESSION:  Positive response to neoadjuvant therapy. Biopsy-proven malignancy  in the lower inner quadrant of the right breast is significantly  decreased in enhancement and is decreased in size. Current maximal  dimension is 1.0 cm.     Assessment:  41 year old [place], [name] woman, status post right breast biopsy in September 2012 for a clinically 2 cm invasive ductal carcinoma, with some enlarged Right axillary lymph nodes, the largest one of which was negative by biopsy. The tumor was grade 2, estrogen receptor positive at 88%, progesterone receptor positive at 11%, HER-2/neu positive by CISH with a ratio of 4.61, and a proliferation marker of 36%. Currently being treated in the neoadjuvant setting with Q 3 week docetaxel/ carboplatin/ trastuzumab.   Plan:  [name] continues to tolerate treatment well, and return in 2 weeks on January 17 for day 1 cycle 5 of therapy. She is already scheduled for Neulasta on day 2, and I will see her the following week on January 24 for assessment chemotoxicity.  In the meanwhile I have requested appointments for consultation with Dr. Odis Luster as well as Dr. Kelly Splinter with regards to breast reconstruction. The patient is also due to meet with her surgeon, Dr. Derrell Lolling, for assessment of response to neoadjuvant chemotherapy. She is, of course,  leaning towards bilateral mastectomies, and would like to discuss this a little further with Dr. Derrell Lolling.  I am refilling [name] is ibuprofen which she takes effectively for her muscle aches and pains.  Both the patient and her husband voice understanding and agreement with this plan, and will call with any changes or problems.   Judith Rease, PA-C 04/13/2011

## 2011-04-14 ENCOUNTER — Telehealth (INDEPENDENT_AMBULATORY_CARE_PROVIDER_SITE_OTHER): Payer: Self-pay

## 2011-04-14 NOTE — Telephone Encounter (Signed)
Pt left msg wanting sooner appt than 2-5. I have moved appt up to 1-21 arrive by 4:00. I LMOM with date.  I also lmom for pt that ok to see plastic surgeon before or after appt with Dr Derrell Lolling.

## 2011-04-27 ENCOUNTER — Other Ambulatory Visit (HOSPITAL_BASED_OUTPATIENT_CLINIC_OR_DEPARTMENT_OTHER): Payer: BC Managed Care – PPO | Admitting: Lab

## 2011-04-27 ENCOUNTER — Ambulatory Visit (HOSPITAL_BASED_OUTPATIENT_CLINIC_OR_DEPARTMENT_OTHER): Payer: BC Managed Care – PPO | Admitting: Physician Assistant

## 2011-04-27 ENCOUNTER — Ambulatory Visit (HOSPITAL_BASED_OUTPATIENT_CLINIC_OR_DEPARTMENT_OTHER): Payer: BC Managed Care – PPO

## 2011-04-27 ENCOUNTER — Encounter: Payer: Self-pay | Admitting: Physician Assistant

## 2011-04-27 VITALS — BP 108/71 | HR 72 | Temp 97.8°F | Ht 63.5 in | Wt 134.8 lb

## 2011-04-27 DIAGNOSIS — Z5111 Encounter for antineoplastic chemotherapy: Secondary | ICD-10-CM

## 2011-04-27 DIAGNOSIS — C50319 Malignant neoplasm of lower-inner quadrant of unspecified female breast: Secondary | ICD-10-CM

## 2011-04-27 DIAGNOSIS — C50919 Malignant neoplasm of unspecified site of unspecified female breast: Secondary | ICD-10-CM

## 2011-04-27 DIAGNOSIS — N63 Unspecified lump in unspecified breast: Secondary | ICD-10-CM

## 2011-04-27 DIAGNOSIS — F411 Generalized anxiety disorder: Secondary | ICD-10-CM

## 2011-04-27 DIAGNOSIS — Z170 Estrogen receptor positive status [ER+]: Secondary | ICD-10-CM

## 2011-04-27 DIAGNOSIS — G4700 Insomnia, unspecified: Secondary | ICD-10-CM

## 2011-04-27 LAB — CBC WITH DIFFERENTIAL/PLATELET
BASO%: 0 % (ref 0.0–2.0)
Basophils Absolute: 0 10*3/uL (ref 0.0–0.1)
EOS%: 0 % (ref 0.0–7.0)
Eosinophils Absolute: 0 10*3/uL (ref 0.0–0.5)
HCT: 32.9 % — ABNORMAL LOW (ref 34.8–46.6)
HGB: 11.2 g/dL — ABNORMAL LOW (ref 11.6–15.9)
LYMPH%: 9.1 % — ABNORMAL LOW (ref 14.0–49.7)
MCH: 34.6 pg — ABNORMAL HIGH (ref 25.1–34.0)
MCHC: 34.1 g/dL (ref 31.5–36.0)
MCV: 101.5 fL — ABNORMAL HIGH (ref 79.5–101.0)
MONO#: 1.1 10*3/uL — ABNORMAL HIGH (ref 0.1–0.9)
MONO%: 9.6 % (ref 0.0–14.0)
NEUT#: 9.5 10*3/uL — ABNORMAL HIGH (ref 1.5–6.5)
NEUT%: 81.3 % — ABNORMAL HIGH (ref 38.4–76.8)
Platelets: 188 10*3/uL (ref 145–400)
RBC: 3.24 10*6/uL — ABNORMAL LOW (ref 3.70–5.45)
RDW: 20 % — ABNORMAL HIGH (ref 11.2–14.5)
WBC: 11.7 10*3/uL — ABNORMAL HIGH (ref 3.9–10.3)
lymph#: 1.1 10*3/uL (ref 0.9–3.3)

## 2011-04-27 MED ORDER — LORAZEPAM 2 MG/ML IJ SOLN
0.5000 mg | Freq: Once | INTRAMUSCULAR | Status: AC
  Administered 2011-04-27: 0.5 mg via INTRAVENOUS

## 2011-04-27 MED ORDER — TRASTUZUMAB CHEMO INJECTION 440 MG
6.0000 mg/kg | Freq: Once | INTRAVENOUS | Status: AC
  Administered 2011-04-27: 357 mg via INTRAVENOUS
  Filled 2011-04-27: qty 17

## 2011-04-27 MED ORDER — LORAZEPAM 1 MG PO TABS: 0.5000 mg | ORAL_TABLET | Freq: Two times a day (BID) | ORAL | Status: DC | PRN

## 2011-04-27 MED ORDER — DIPHENHYDRAMINE HCL 25 MG PO CAPS
25.0000 mg | ORAL_CAPSULE | Freq: Once | ORAL | Status: AC
  Administered 2011-04-27: 25 mg via ORAL

## 2011-04-27 MED ORDER — ACETAMINOPHEN 325 MG PO TABS
650.0000 mg | ORAL_TABLET | Freq: Once | ORAL | Status: AC
  Administered 2011-04-27: 650 mg via ORAL

## 2011-04-27 MED ORDER — SODIUM CHLORIDE 0.9 % IJ SOLN
10.0000 mL | INTRAMUSCULAR | Status: DC | PRN
  Administered 2011-04-27: 10 mL
  Filled 2011-04-27: qty 10

## 2011-04-27 MED ORDER — HEPARIN SOD (PORK) LOCK FLUSH 100 UNIT/ML IV SOLN
500.0000 [IU] | Freq: Once | INTRAVENOUS | Status: AC | PRN
  Administered 2011-04-27: 500 [IU]
  Filled 2011-04-27: qty 5

## 2011-04-27 MED ORDER — SODIUM CHLORIDE 0.9 % IV SOLN
751.8000 mg | Freq: Once | INTRAVENOUS | Status: AC
  Administered 2011-04-27: 750 mg via INTRAVENOUS
  Filled 2011-04-27: qty 75

## 2011-04-27 MED ORDER — DEXAMETHASONE SODIUM PHOSPHATE 4 MG/ML IJ SOLN
20.0000 mg | Freq: Once | INTRAMUSCULAR | Status: AC
  Administered 2011-04-27: 20 mg via INTRAVENOUS

## 2011-04-27 MED ORDER — DOCETAXEL CHEMO INJECTION 160 MG/16ML
75.0000 mg/m2 | Freq: Once | INTRAVENOUS | Status: AC
  Administered 2011-04-27: 120 mg via INTRAVENOUS
  Filled 2011-04-27: qty 12

## 2011-04-27 MED ORDER — ONDANSETRON 16 MG/50ML IVPB (CHCC)
16.0000 mg | Freq: Once | INTRAVENOUS | Status: AC
  Administered 2011-04-27: 16 mg via INTRAVENOUS

## 2011-04-27 NOTE — Progress Notes (Signed)
Hematology and Oncology Follow Up Visit  [name] [id number] 04-03-1971 41 y.o. 04/27/2011    HPI: [name] (goes by "[name]") is a [place] woman who, at the age of 18, was referred by Dr. Coral Ceo for treatment of right breast carcinoma.  The patient palpated a mass in her right breast which she brought to Dr. Verdell Carmine attention. A prior screening mammogram on 02/06/2010 had been unremarkable. The patient was then scheduled for bilateral diagnostic mammogram and right breast ultrasonography on 12/22/2010, the studies demonstrating an ill defined mass with heterogeneous calcifications in the inner lower right breast, measuring 1.7 cm. The breasts are heterogeneously dense, but there were no other mammographic abnormalities in either breast. Clinically, there was a palpable firm mass at the 5:00 position of the right breast, 5 cm from the nipple. There were no palpable abnormalities in the right axilla. Ultrasound confirmed a 1.7 cm irregular, hypoechoic mass in the same position. There were some lymph nodes noted in the right exam of, one measuring 1.6 cm with a minimally thickened cortex inferiorly.  An ultrasound-guided core biopsy on 12/23/2010 [phone]) showed an invasive ductal carcinoma, grade 2, ER positive at 88%, PR positive at 11 is %, HER-2/neu positive with a FISH ratio of 4.61, and an MIB-1 of 36%.  The patient was referred to Dr. Claud Kelp in bilateral breast MRIs were obtained 12/30/2010. This showed the mass in question in the right breast to measure 2.0 cm proximally. In the right exam was a 1.8 cm lymph node with a thickened cortex. The left breast was unremarkable.  The patient was seen in the multidisciplinary breast cancer clinic and neoadjuvant chemotherapy was decided upon, to consist of 6 q. three-week cycles of carboplatin, docetaxel, and trastuzumab, with Neulasta given on day 2 for granulocyte support.  Interim History:  Patient  returns today accompanied by her husband in anticipation of day 1 cycle 5 of 6 planned every 3 week doses of docetaxel/carboplatin/trastuzumab given in the neoadjuvant setting for her locally advanced right breast carcinoma.. She receives Neulasta on day 2 for granulocyte support.  [name] has developed a "head cold" with a runny nose, sinus congestion, and scratchy throat. She's had no cough or phlegm production denies any fevers, chills, or night sweats. No shortness of breath. She's been taking over-the-counter cold medication which she finds somewhat helpful.  Otherwise [name] complains of some bony aches and pains and muscle aches and pains in her lower treatment as this past week. Her legs swell for 2 or 3 days last week, but this is completely resolved. The swelling was bilateral. No swelling in the upper extremities. No numbness, tingling, or signs of peripheral neuropathy. No skin changes or nail bed changes. No mouth ulcers or oral sensitivity. No nausea or change in bowel habits. No excessive tearing.  I will note that interim history is remarkable for her [name] having met with both Dr. Odis Luster and Dr. Kelly Splinter since her last visit here to discuss choices regarding reconstruction, should she decide to pursue bilateral mastectomies.  A detailed review of systems is otherwise noncontributory as noted below.  Review of Systems: Constitutional: fatigue, no weight loss, fever, night sweats Eyes: negative WGN:FAOZH congestion and sinus pain, sore throat Cardiovascular: no chest pain or dyspnea on exertion Respiratory: no cough, shortness of breath, or wheezing Neurological: negative Dermatological: negative Gastrointestinal: no abdominal pain, nausea, change in bowel habits, or black or bloody stools Genito-Urinary: negative Hematological and Lymphatic: negative Breast: negative Musculoskeletal: muscle pain  in lower extremities, edema in legs, bilateral - resolved Remaining ROS  negative.    Medications: I have reviewed the patient's current medications. Current Outpatient Prescriptions  Medication Sig Dispense Refill  . aspirin-acetaminophen-caffeine (EXCEDRIN MIGRAINE) 250-250-65 MG per tablet Take 1 tablet by mouth 1 day or 1 dose.        [name] dexamethasone (DECADRON) 1 MG tablet Take 1 mg by mouth daily with breakfast. Takes several days prior to chemotherapy.       . fish oil-omega-3 fatty acids 1000 MG capsule Take 2 g by mouth daily.        [name] guaiFENesin (MUCINEX) 600 MG 12 hr tablet Take 1,200 mg by mouth 2 (two) times daily.        . hydrOXYzine (ATARAX/VISTARIL) 25 MG tablet Take 25 mg by mouth 3 (three) times daily as needed.        . lidocaine-prilocaine (EMLA) cream Apply 1 application topically as needed.      . loratadine (CLARITIN) 10 MG tablet Take 10 mg by mouth 2 (two) times daily.        [name] LORazepam (ATIVAN) 1 MG tablet Take 0.5 tablets (0.5 mg total) by mouth 2 (two) times daily as needed for anxiety.  30 tablet  0  . metoCLOPramide (REGLAN) 10 MG tablet Take 10 mg by mouth 4 (four) times daily.        . Multiple Vitamin (MULTIVITAMIN) capsule Take 1 capsule by mouth daily.        [name] omeprazole (PRILOSEC) 20 MG capsule Take 20 mg by mouth Twice daily.      . ondansetron (ZOFRAN) 4 MG tablet Take 4 mg by mouth every 8 (eight) hours as needed.        . prenatal vitamin w/FE, FA (PRENATAL 1 + 1) 27-1 MG TABS Take 1 tablet by mouth Daily.      [name] tobramycin-dexamethasone (TOBRADEX) ophthalmic solution Apply 1 drop to eye Twice daily.        Allergies:  Allergies  Allergen Reactions  . Codeine   . Morphine And Related   . Penicillins   . Tussionex Pennkinetic Er (Hydrocod Polst-Cpm Polst)     Physical Exam: Filed Vitals:   04/27/11 1156  BP: 108/71  Pulse: 72  Temp: 97.8 F (36.6 C)  Body mass index is 23.50 kg/(m^2).  HEENT:  Sclerae anicteric, conjunctivae pink.  Oropharynx clear.  No mucositis or candidiasis.  No pharyngeal erythema or  exudate noted Nodes:  No cervical, supraclavicular, or axillary lymphadenopathy palpated.  Breast Exam: Right breast, unable to palpate a distinct mass in the right breast. No obvious skin changes. Left breast, no masses, skin changes, or nipple inversion. Breast tissue is dense bilaterally. Port is intact the left upper chest wall, no erythema, edema, or evidence of infection. Lungs:  Clear to auscultation bilaterally.  No crackles, rhonchi, or wheezes.   Heart:  Regular rate and rhythm.   Abdomen:  Soft, nontender. Positive bowel sounds.  No organomegaly or masses palpated.   Musculoskeletal:  No focal spinal tenderness to palpation.  Extremities:  Benign.  No peripheral edema or cyanosis.   Neuro:  Nonfocal. Skin: Benign.  Lab Results: Lab Results  Component Value Date   WBC 11.7* 04/27/2011   HGB 11.2* 04/27/2011   HCT 32.9* 04/27/2011   MCV 101.5* 04/27/2011   PLT 188 04/27/2011   NEUTROABS 9.5* 04/27/2011     Chemistry      Component Value Date/Time   NA 140 04/06/2011  1047   K 4.1 04/06/2011 1047   CL 104 04/06/2011 1047   CO2 27 04/06/2011 1047   BUN 13 04/06/2011 1047   CREATININE 0.69 04/06/2011 1047      Component Value Date/Time   CALCIUM 10.0 04/06/2011 1047   ALKPHOS 60 04/06/2011 1047   AST 30 04/06/2011 1047   ALT 35 04/06/2011 1047   BILITOT 0.7 04/06/2011 1047        Radiological Studies: BILATERAL BREAST MRI WITH AND WITHOUT CONTRAST 03/27/2011 .  Comparison: Bilateral breast MRI September 2012  Findings: There is a mild background parenchymal enhancement  pattern bilaterally. The biopsy-proven carcinoma in the lower  inner quadrant of the right breast, posterior third, enhances to a  much lesser degree on today's study. It is nearly occult on the 20-  second postcontrast images, but increases in enhancement over time.  It demonstrates low-level progressive enhancement. This spiculated  mass currently measures 1.0 x 1.2 x 0.8 cm (previously 1.5 x 1.3 x   2.0 cm in September 2012). There is central biopsy clip artifact.  No new areas of enhancement are identified in the right breast.  There is no abnormal enhancement of the pectoralis muscle.  There is no mass or suspicious enhancement in the left breast.  No axillary or internal mammary chain lymphadenopathy is  identified.  IMPRESSION:  Positive response to neoadjuvant therapy. Biopsy-proven malignancy  in the lower inner quadrant of the right breast is significantly  decreased in enhancement and is decreased in size. Current maximal  dimension is 1.0 cm.     Assessment:  41 year old [place], [name] woman, status post right breast biopsy in September 2012 for a clinically 2 cm invasive ductal carcinoma, with some enlarged Right axillary lymph nodes, the largest one of which was negative by biopsy. The tumor was grade 2, estrogen receptor positive at 88%, progesterone receptor positive at 11%, HER-2/neu positive by CISH with a ratio of 4.61, and a proliferation marker of 36%. Currently being treated in the neoadjuvant setting with Q 3 week docetaxel/ carboplatin/ trastuzumab.   Plan:  [name] will proceed to treatment today as scheduled for her fifth dose of docetaxel/carboplatin/trastuzumab. She will return tomorrow for her Neulasta injection and I will see her next week on January 24 for assessment of chemotoxicity.  Requesting a followup appointment with repeat echocardiogram with Dr.Bensimhon in late January or early February. We are also requesting a repeat breast MRI in mid February after the completion of neoadjuvant chemotherapy to assess response. She's RA scheduled to meet with Dr. Derrell Lolling in February as well.  I am refilling [name] is Ativan today which she takes effectively for her mild anxiety, and sleeplessness.   Both the patient and her husband voice understanding and agreement with this plan, and will call with any changes or problems.   Jamoni Hewes,  PA-C 04/27/2011

## 2011-04-27 NOTE — Patient Instructions (Signed)
1630 d/c'd ambulatory to home with spouse. No complaints. Encouraged to call MD with any problems

## 2011-04-28 ENCOUNTER — Telehealth: Payer: Self-pay | Admitting: Oncology

## 2011-04-28 ENCOUNTER — Ambulatory Visit (HOSPITAL_BASED_OUTPATIENT_CLINIC_OR_DEPARTMENT_OTHER): Payer: BC Managed Care – PPO

## 2011-04-28 DIAGNOSIS — N63 Unspecified lump in unspecified breast: Secondary | ICD-10-CM

## 2011-04-28 DIAGNOSIS — Z5189 Encounter for other specified aftercare: Secondary | ICD-10-CM

## 2011-04-28 DIAGNOSIS — C50919 Malignant neoplasm of unspecified site of unspecified female breast: Secondary | ICD-10-CM

## 2011-04-28 MED ORDER — PEGFILGRASTIM INJECTION 6 MG/0.6ML
6.0000 mg | Freq: Once | SUBCUTANEOUS | Status: AC
  Administered 2011-04-28: 6 mg via SUBCUTANEOUS

## 2011-04-28 NOTE — Telephone Encounter (Signed)
lmonvm adviisng the pt of her breast mri appt on 05/22/2011@1 :00pm per pt's request

## 2011-05-01 ENCOUNTER — Encounter (INDEPENDENT_AMBULATORY_CARE_PROVIDER_SITE_OTHER): Payer: BC Managed Care – PPO | Admitting: General Surgery

## 2011-05-04 ENCOUNTER — Ambulatory Visit (HOSPITAL_BASED_OUTPATIENT_CLINIC_OR_DEPARTMENT_OTHER): Payer: BC Managed Care – PPO

## 2011-05-04 ENCOUNTER — Ambulatory Visit (HOSPITAL_BASED_OUTPATIENT_CLINIC_OR_DEPARTMENT_OTHER): Payer: BC Managed Care – PPO | Admitting: Physician Assistant

## 2011-05-04 ENCOUNTER — Encounter: Payer: Self-pay | Admitting: Physician Assistant

## 2011-05-04 ENCOUNTER — Ambulatory Visit: Payer: BC Managed Care – PPO

## 2011-05-04 ENCOUNTER — Other Ambulatory Visit: Payer: BC Managed Care – PPO | Admitting: Lab

## 2011-05-04 VITALS — BP 102/69 | HR 119 | Temp 97.8°F | Ht 63.5 in | Wt 128.4 lb

## 2011-05-04 DIAGNOSIS — C50919 Malignant neoplasm of unspecified site of unspecified female breast: Secondary | ICD-10-CM

## 2011-05-04 DIAGNOSIS — E860 Dehydration: Secondary | ICD-10-CM

## 2011-05-04 DIAGNOSIS — J019 Acute sinusitis, unspecified: Secondary | ICD-10-CM

## 2011-05-04 LAB — CBC WITH DIFFERENTIAL/PLATELET
BASO%: 0.3 % (ref 0.0–2.0)
Basophils Absolute: 0 10*3/uL (ref 0.0–0.1)
EOS%: 0 % (ref 0.0–7.0)
Eosinophils Absolute: 0 10*3/uL (ref 0.0–0.5)
HCT: 31.4 % — ABNORMAL LOW (ref 34.8–46.6)
HGB: 10.8 g/dL — ABNORMAL LOW (ref 11.6–15.9)
LYMPH%: 27 % (ref 14.0–49.7)
MCH: 34 pg (ref 25.1–34.0)
MCHC: 34.4 g/dL (ref 31.5–36.0)
MCV: 98.7 fL (ref 79.5–101.0)
MONO#: 1.1 10*3/uL — ABNORMAL HIGH (ref 0.1–0.9)
MONO%: 30.1 % — ABNORMAL HIGH (ref 0.0–14.0)
NEUT#: 1.5 10*3/uL (ref 1.5–6.5)
NEUT%: 42.6 % (ref 38.4–76.8)
Platelets: 123 10*3/uL — ABNORMAL LOW (ref 145–400)
RBC: 3.18 10*6/uL — ABNORMAL LOW (ref 3.70–5.45)
RDW: 15.6 % — ABNORMAL HIGH (ref 11.2–14.5)
WBC: 3.6 10*3/uL — ABNORMAL LOW (ref 3.9–10.3)
lymph#: 1 10*3/uL (ref 0.9–3.3)
nRBC: 0 % (ref 0–0)

## 2011-05-04 LAB — COMPREHENSIVE METABOLIC PANEL
ALT: 43 U/L — ABNORMAL HIGH (ref 0–35)
AST: 20 U/L (ref 0–37)
Albumin: 3.9 g/dL (ref 3.5–5.2)
Alkaline Phosphatase: 82 U/L (ref 39–117)
BUN: 11 mg/dL (ref 6–23)
CO2: 25 mEq/L (ref 19–32)
Calcium: 9.7 mg/dL (ref 8.4–10.5)
Chloride: 99 mEq/L (ref 96–112)
Creatinine, Ser: 0.59 mg/dL (ref 0.50–1.10)
Glucose, Bld: 97 mg/dL (ref 70–99)
Potassium: 4.1 mEq/L (ref 3.5–5.3)
Sodium: 134 mEq/L — ABNORMAL LOW (ref 135–145)
Total Bilirubin: 0.3 mg/dL (ref 0.3–1.2)
Total Protein: 6.9 g/dL (ref 6.0–8.3)

## 2011-05-04 MED ORDER — AZITHROMYCIN 250 MG PO TABS: ORAL_TABLET | ORAL | Status: AC

## 2011-05-04 MED ORDER — SODIUM CHLORIDE 0.9 % IV SOLN
INTRAVENOUS | Status: DC
  Administered 2011-05-04: 15:00:00 via INTRAVENOUS

## 2011-05-04 NOTE — Progress Notes (Signed)
Hematology and Oncology Follow Up Visit  [name] [id number] 26-Aug-1970 41 y.o. 05/04/2011    HPI: [name] (goes by "[name]") is a [place] woman who, at the age of 16, was referred by Dr. Coral Ceo for treatment of right breast carcinoma.  The patient palpated a mass in her right breast which she brought to Dr. Verdell Carmine attention. A prior screening mammogram on 02/06/2010 had been unremarkable. The patient was then scheduled for bilateral diagnostic mammogram and right breast ultrasonography on 12/22/2010, the studies demonstrating an ill defined mass with heterogeneous calcifications in the inner lower right breast, measuring 1.7 cm. The breasts are heterogeneously dense, but there were no other mammographic abnormalities in either breast. Clinically, there was a palpable firm mass at the 5:00 position of the right breast, 5 cm from the nipple. There were no palpable abnormalities in the right axilla. Ultrasound confirmed a 1.7 cm irregular, hypoechoic mass in the same position. There were some lymph nodes noted in the right exam of, one measuring 1.6 cm with a minimally thickened cortex inferiorly.  An ultrasound-guided core biopsy on 12/23/2010 [phone]) showed an invasive ductal carcinoma, grade 2, ER positive at 88%, PR positive at 11 is %, HER-2/neu positive with a FISH ratio of 4.61, and an MIB-1 of 36%.  The patient was referred to Dr. Claud Kelp in bilateral breast MRIs were obtained 12/30/2010. This showed the mass in question in the right breast to measure 2.0 cm proximally. In the right exam was a 1.8 cm lymph node with a thickened cortex. The left breast was unremarkable.  The patient was seen in the multidisciplinary breast cancer clinic and neoadjuvant chemotherapy was decided upon, to consist of 6 q. three-week cycles of carboplatin, docetaxel, and trastuzumab, with Neulasta given on day 2 for granulocyte support.  Interim History:  Patient  returns today accompanied by her husband for assessment chemotoxicity on day 8 cycle 5 of 6 planned every 3 week doses of docetaxel/carboplatin/trastuzumab given in the neoadjuvant setting for her locally advanced right breast carcinoma.. She receives Neulasta on day 2 for granulocyte support.  When seen here one week ago, [name] had developed a  "head cold" with a runny nose, sinus congestion, and scratchy throat. She had no cough or phlegm production denied any fevers, chills, or night sweats. No shortness of breath. She has been taking over-the-counter cold medication which she finds somewhat helpful.  Since her visit last week, however, or sinus congestion has become worse. When she blows her nose, the mucus is dark yellow or light green. She has a slight cough, but no phlegm production. She continues to deny any fevers or chills.  [name] is extremely weak today on presentation. She tells me that last night and again this morning she felt so weak she "thought she would pass out". She denies any loss of consciousness. She's had no falls. She denies any abnormal headaches or dizziness per se. She notes that she was constipated earlier in the week and took stool softeners and MiraLAX. Subsequently, she has had diarrhea for the last 36 hours on and off. She tries to drink linea fluid, but it sounds like she is likely dehydrated. She's had no nausea or emesis.   A detailed review of systems is otherwise noncontributory as noted below.  Review of Systems: Constitutional: fatigue, weakness, no weight loss, fever, night sweats Eyes: negative YQM:VHQIO congestion and sinus pain, sore throat Cardiovascular: no chest pain or dyspnea on exertion Respiratory: no cough, shortness  of breath, or wheezing Neurological: negative Dermatological: negative Gastrointestinal: recent history of  constipation, resolved with the use of laxatives, now with diarrhea. no abdominal pain, nausea,  or black or bloody  stools Genito-Urinary: negative Hematological and Lymphatic: negative Breast: negative Musculoskeletal: negative Remaining ROS negative.    Medications: I have reviewed the patient's current medications. Current Outpatient Prescriptions  Medication Sig Dispense Refill  . aspirin-acetaminophen-caffeine (EXCEDRIN MIGRAINE) 250-250-65 MG per tablet Take 1 tablet by mouth 1 day or 1 dose.        [name] azithromycin (ZITHROMAX Z-PAK) 250 MG tablet 2 tabs po x 1 day, then 1 tab po daily x 4 days  6 each  0  . dexamethasone (DECADRON) 1 MG tablet Take 1 mg by mouth daily with breakfast. Takes several days prior to chemotherapy.       . docusate sodium (COLACE) 100 MG capsule Take 100 mg by mouth 2 (two) times daily as needed.      . fish oil-omega-3 fatty acids 1000 MG capsule Take 2 g by mouth daily.        [name] guaiFENesin (MUCINEX) 600 MG 12 hr tablet Take 1,200 mg by mouth 2 (two) times daily.        . hydrOXYzine (ATARAX/VISTARIL) 25 MG tablet Take 25 mg by mouth 3 (three) times daily as needed.        [name] ibuprofen (ADVIL,MOTRIN) 800 MG tablet Take 800 mg by mouth as needed.      . lidocaine-prilocaine (EMLA) cream Apply 1 application topically as needed.      . loratadine (CLARITIN) 10 MG tablet Take 10 mg by mouth 2 (two) times daily.        [name] LORazepam (ATIVAN) 1 MG tablet Take 0.5 tablets (0.5 mg total) by mouth 2 (two) times daily as needed for anxiety.  30 tablet  0  . metoCLOPramide (REGLAN) 10 MG tablet Take 10 mg by mouth 4 (four) times daily.        . Multiple Vitamin (MULTIVITAMIN) capsule Take 1 capsule by mouth daily.        [name] omeprazole (PRILOSEC) 20 MG capsule Take 20 mg by mouth Twice daily.      . ondansetron (ZOFRAN) 4 MG tablet Take 4 mg by mouth every 8 (eight) hours as needed.        . polyethylene glycol (MIRALAX / GLYCOLAX) packet Take 17 g by mouth daily as needed.      . prenatal vitamin w/FE, FA (PRENATAL 1 + 1) 27-1 MG TABS Take 1 tablet by mouth Daily.      [name]  tobramycin-dexamethasone (TOBRADEX) ophthalmic solution Apply 1 drop to eye Twice daily.       Current Facility-Administered Medications  Medication Dose Route Frequency Provider Last Rate Last Dose  . 0.9 %  sodium chloride infusion   Intravenous Continuous Zollie Scale, PA        Allergies:  Allergies  Allergen Reactions  . Codeine   . Morphine And Related   . Penicillins   . Tussionex Pennkinetic Er (Hydrocod Polst-Cpm Polst)     Physical Exam: Filed Vitals:   05/04/11 1410  BP: 102/69  Pulse: 119  Temp: 97.8 F (36.6 C)  Body mass index is 22.39 kg/(m^2).  HEENT:  Sclerae anicteric, conjunctivae pink.  Oropharynx clear.  No mucositis or candidiasis.  No pharyngeal erythema or exudate noted Nodes:  No cervical, supraclavicular, or axillary lymphadenopathy palpated.  Breast Exam:  deferred  Lungs:  Clear to auscultation  bilaterally.  No crackles, rhonchi, or wheezes.   Heart:  Regular rate and rhythm.   Abdomen:  Soft, nontender. Positive bowel sounds.  No organomegaly or masses palpated.   Musculoskeletal:  No focal spinal tenderness to palpation.  Extremities:  Benign.  No peripheral edema or cyanosis.   Neuro:  Nonfocal. Skin: Benign.  Lab Results: Lab Results  Component Value Date   WBC 3.6* 05/04/2011   HGB 10.8* 05/04/2011   HCT 31.4* 05/04/2011   MCV 98.7 05/04/2011   PLT 123* 05/04/2011   NEUTROABS 1.5 05/04/2011     Chemistry      Component Value Date/Time   NA 140 04/06/2011 1047   K 4.1 04/06/2011 1047   CL 104 04/06/2011 1047   CO2 27 04/06/2011 1047   BUN 13 04/06/2011 1047   CREATININE 0.69 04/06/2011 1047      Component Value Date/Time   CALCIUM 10.0 04/06/2011 1047   ALKPHOS 60 04/06/2011 1047   AST 30 04/06/2011 1047   ALT 35 04/06/2011 1047   BILITOT 0.7 04/06/2011 1047        Radiological Studies: BILATERAL BREAST MRI WITH AND WITHOUT CONTRAST 03/27/2011 .  Comparison: Bilateral breast MRI September 2012  Findings: There is a mild  background parenchymal enhancement  pattern bilaterally. The biopsy-proven carcinoma in the lower  inner quadrant of the right breast, posterior third, enhances to a  much lesser degree on today's study. It is nearly occult on the 20-  second postcontrast images, but increases in enhancement over time.  It demonstrates low-level progressive enhancement. This spiculated  mass currently measures 1.0 x 1.2 x 0.8 cm (previously 1.5 x 1.3 x  2.0 cm in September 2012). There is central biopsy clip artifact.  No new areas of enhancement are identified in the right breast.  There is no abnormal enhancement of the pectoralis muscle.  There is no mass or suspicious enhancement in the left breast.  No axillary or internal mammary chain lymphadenopathy is  identified.  IMPRESSION:  Positive response to neoadjuvant therapy. Biopsy-proven malignancy  in the lower inner quadrant of the right breast is significantly  decreased in enhancement and is decreased in size. Current maximal  dimension is 1.0 cm.     Assessment:  41 year old [place], [name] woman,   (1)  status post right breast biopsy in September 2012 for a clinically 2 cm invasive ductal carcinoma, with some enlarged Right axillary lymph nodes, the largest one of which was negative by biopsy. The tumor was grade 2, estrogen receptor positive at 88%, progesterone receptor positive at 11%, HER-2/neu positive by CISH with a ratio of 4.61, and a proliferation marker of 36%.   (2)  Currently being treated in the neoadjuvant setting with Q 3 week docetaxel/ carboplatin/ trastuzumab.   Plan:  [name] will receive some supportive IV fluids here in the office today, and I am also having a  CMET drawn to evaluate her electrolytes in light of the recent diarrhea. We discussed her intake of fluid, reiterating the importance of keeping herself well hydrated. I'm also starting her on a Z-Pak for sinusitis. I've asked that she contact us with any fevers of  100 or above, and certainly if the weakness is not resolve, she will let us know.  Otherwise she is scheduled to meet with Dr. Derrell Lolling in 2 weeks to discuss her upcoming surgery. She still has a lot of questions regarding whether or not to proceed with lumpectomy versus mastectomy or perhaps bilateral  mastectomy. She's also scheduled for repeat breast MRI following completion of her neoadjuvant chemotherapy.    Both the patient and her husband voice understanding and agreement with this plan, and will call with any changes or problems.   Judith Kramlich, PA-C 05/04/2011

## 2011-05-05 ENCOUNTER — Telehealth: Payer: Self-pay | Admitting: *Deleted

## 2011-05-05 NOTE — Telephone Encounter (Signed)
Called PT to inform of recent labs and to f/u after IVF bolus given the day before (05/04/11) pt states that she is still having loose stools Imodium was suggested along with plenty of hydration. Pt knows to call the "on call doctor" this week end and to report to the nearest ED

## 2011-05-16 ENCOUNTER — Ambulatory Visit (INDEPENDENT_AMBULATORY_CARE_PROVIDER_SITE_OTHER): Payer: BC Managed Care – PPO | Admitting: General Surgery

## 2011-05-16 ENCOUNTER — Encounter (INDEPENDENT_AMBULATORY_CARE_PROVIDER_SITE_OTHER): Payer: BC Managed Care – PPO | Admitting: General Surgery

## 2011-05-16 ENCOUNTER — Encounter (INDEPENDENT_AMBULATORY_CARE_PROVIDER_SITE_OTHER): Payer: Self-pay | Admitting: General Surgery

## 2011-05-16 VITALS — BP 96/62 | HR 72 | Temp 97.8°F | Resp 18 | Ht 63.0 in | Wt 135.6 lb

## 2011-05-16 DIAGNOSIS — C50919 Malignant neoplasm of unspecified site of unspecified female breast: Secondary | ICD-10-CM

## 2011-05-16 DIAGNOSIS — C50911 Malignant neoplasm of unspecified site of right female breast: Secondary | ICD-10-CM

## 2011-05-16 NOTE — Progress Notes (Addendum)
Subjective:     Patient ID: [name], female   DOB: 26-Dec-1970, 41 y.o.   MRN: [id number]  HPI  This very pleasant 41 year old female returns with her husband to discuss definitive surgical options for her right breast cancer.  She was diagnosed with cancer of the right breast on December 23 2010. There was a 2.0 cm tumor in the right breast, lower inner quadrant which was invasive ductal carcinoma, T1c, N0 in the posterior third. This was receptor positive and HER-2 positive. A biopsy of the right axilla lymph node was negative. Genetic testing is negative for BRCA1/BRCA2.  She was uncertain as to the extent of the surgery she wanted, and so the decision was made to go ahead and place a Port-A-Cath which was done on January 20, 2011 and to proceed with neoadjuvant chemotherapy under the guidance of Dr. Marikay Alar Magrinat.  She has done well. MRI on March 16, 2011 shows a positive response on the right with decreased size and decreased enhancement  with the tumor from about 2.0 cm down to about 1.2 cm. Left breast is normal. There is no adenopathy.  She has seen both Dr. Etter Sjogren and Dr. Shella Spearing . She has talked about every reconstructive option and pathway including unilateral versus bilateral mastectomy, nipple sparing versus skin sparing, latissimus flap, and implant  based. She is still somewhat ambivalent about what she wants to do.  She returns to see me today, having been told by her oncologist that it is time to go ahead with definitive surgery. I spent  one hour with the patient her husband discussing numerous options and answering numerous questions.  At the completion of our conversation it is apparent that she wants a mastectomy and is not interested in  lumpectomy. She is leaning toward unilateral versus bilateral mastectomy at this time. She really wants  to be symmetrical.   We spent a long time talking about cancer treatment and cosmetic issues. Review of Systems 12  system review of systems is performed and is negative except as described above.    Objective:   Physical Exam Patient is healthy and in no distress. Her husband is with her throughout the encounter  Neck: no adenopathy, no mass, no jugular venous distention Lungs: Clear to auscultation bilaterally Heart: regular rate and rhythm. No ectopy. No murmur,.     Breasts:    small. No skin change. No mass. No axillary adenopathy. Nipple areolar complexes are normal.    Assessment:     Invasive ductal carcinoma right breast, lower inner quadrant, currently being treated in the neoadjuvant setting. Stage TI C., N0 clinically. Receptor positive. HER-2 positive.  Status post Port-A-Cath insertion, no problems with Port-A-Cath    Plan:     We spent one hour talking about skin sparing mastectomy versus nipple sparing mastectomy. I described techniques in detail. She is aware that with nipple sparing that there is a slight risk of nipple necrosis and a slight increase risk of of recurrence compared to traditional mastectomy. I told her that the decisions that had to do with reconstructive options are mostly between her and her plastic surgeon, but I answered questions as best I could.  I told her that I would be comfortable with immediate reconstruction, since her risk of postop radiation therapy is relatively low. She is aware that it is not zero.  At the end of the conversation it was clear that she still had lots of plastic surgical questions that need to be  answered before we could proceed with surgery  She is going to make an appointment with the plastic surgeon of her choice. I believe they will finalized their decisions  She will return to see me in 3 weeks, and hopefully we can create a definitive plan for the type of mastectomy she wants to the type of reconstruction she was on the right side

## 2011-05-16 NOTE — Patient Instructions (Signed)
Today, we have extensively discussed options of lumpectomy and sentinel node, unilateral mastectomy versus bilateral mastectomy, nipple sparing vs. Skin sparing mastectomy, with and without reconstruction. We talked about the pros and cons of all approaches.  Your MRI shows that the tumor has responded nicely to the preop chemotherapy.  It is time to go ahead and plan your definitive surgery.  You are going to call the plastic surgeon of your choice, and you're going to make an appointment in the near future to make your final decisions about reconstructive options.  We will make an appointment to see me in 3 weeks for final discussion, we can begin planning by phone if you make a decision.

## 2011-05-17 ENCOUNTER — Telehealth (INDEPENDENT_AMBULATORY_CARE_PROVIDER_SITE_OTHER): Payer: Self-pay

## 2011-05-17 NOTE — Telephone Encounter (Signed)
Pt lmom stating she wanted to speak with nurse or MD with questions. I LMOM to call back and ask me to be paged.

## 2011-05-18 ENCOUNTER — Ambulatory Visit: Payer: BC Managed Care – PPO

## 2011-05-18 ENCOUNTER — Ambulatory Visit (HOSPITAL_BASED_OUTPATIENT_CLINIC_OR_DEPARTMENT_OTHER): Payer: BC Managed Care – PPO

## 2011-05-18 ENCOUNTER — Ambulatory Visit (HOSPITAL_BASED_OUTPATIENT_CLINIC_OR_DEPARTMENT_OTHER): Payer: BC Managed Care – PPO | Admitting: Family

## 2011-05-18 ENCOUNTER — Other Ambulatory Visit (HOSPITAL_BASED_OUTPATIENT_CLINIC_OR_DEPARTMENT_OTHER): Payer: BC Managed Care – PPO | Admitting: Lab

## 2011-05-18 VITALS — BP 108/73 | HR 83 | Temp 98.0°F | Ht 63.0 in | Wt 134.8 lb

## 2011-05-18 DIAGNOSIS — C50919 Malignant neoplasm of unspecified site of unspecified female breast: Secondary | ICD-10-CM

## 2011-05-18 DIAGNOSIS — C50319 Malignant neoplasm of lower-inner quadrant of unspecified female breast: Secondary | ICD-10-CM

## 2011-05-18 DIAGNOSIS — Z170 Estrogen receptor positive status [ER+]: Secondary | ICD-10-CM

## 2011-05-18 DIAGNOSIS — N63 Unspecified lump in unspecified breast: Secondary | ICD-10-CM

## 2011-05-18 DIAGNOSIS — Z5111 Encounter for antineoplastic chemotherapy: Secondary | ICD-10-CM

## 2011-05-18 LAB — CBC WITH DIFFERENTIAL/PLATELET
BASO%: 0 % (ref 0.0–2.0)
Basophils Absolute: 0 10*3/uL (ref 0.0–0.1)
EOS%: 0 % (ref 0.0–7.0)
Eosinophils Absolute: 0 10*3/uL (ref 0.0–0.5)
HCT: 31.2 % — ABNORMAL LOW (ref 34.8–46.6)
HGB: 10.6 g/dL — ABNORMAL LOW (ref 11.6–15.9)
LYMPH%: 8.2 % — ABNORMAL LOW (ref 14.0–49.7)
MCH: 35.4 pg — ABNORMAL HIGH (ref 25.1–34.0)
MCHC: 34 g/dL (ref 31.5–36.0)
MCV: 104 fL — ABNORMAL HIGH (ref 79.5–101.0)
MONO#: 0.6 10*3/uL (ref 0.1–0.9)
MONO%: 6.3 % (ref 0.0–14.0)
NEUT#: 8.1 10*3/uL — ABNORMAL HIGH (ref 1.5–6.5)
NEUT%: 85.5 % — ABNORMAL HIGH (ref 38.4–76.8)
Platelets: 220 10*3/uL (ref 145–400)
RBC: 3 10*6/uL — ABNORMAL LOW (ref 3.70–5.45)
RDW: 17.1 % — ABNORMAL HIGH (ref 11.2–14.5)
WBC: 9.5 10*3/uL (ref 3.9–10.3)
lymph#: 0.8 10*3/uL — ABNORMAL LOW (ref 0.9–3.3)

## 2011-05-18 LAB — COMPREHENSIVE METABOLIC PANEL
ALT: 26 U/L (ref 0–35)
AST: 21 U/L (ref 0–37)
Albumin: 3.9 g/dL (ref 3.5–5.2)
Alkaline Phosphatase: 73 U/L (ref 39–117)
BUN: 13 mg/dL (ref 6–23)
CO2: 28 mEq/L (ref 19–32)
Calcium: 9.8 mg/dL (ref 8.4–10.5)
Chloride: 103 mEq/L (ref 96–112)
Creatinine, Ser: 0.7 mg/dL (ref 0.50–1.10)
Glucose, Bld: 107 mg/dL — ABNORMAL HIGH (ref 70–99)
Potassium: 3.8 mEq/L (ref 3.5–5.3)
Sodium: 141 mEq/L (ref 135–145)
Total Bilirubin: 0.3 mg/dL (ref 0.3–1.2)
Total Protein: 6.6 g/dL (ref 6.0–8.3)

## 2011-05-18 MED ORDER — DOCETAXEL CHEMO INJECTION 160 MG/16ML
75.0000 mg/m2 | Freq: Once | INTRAVENOUS | Status: AC
  Administered 2011-05-18: 120 mg via INTRAVENOUS
  Filled 2011-05-18: qty 12

## 2011-05-18 MED ORDER — SODIUM CHLORIDE 0.9 % IV SOLN
751.8000 mg | Freq: Once | INTRAVENOUS | Status: AC
  Administered 2011-05-18: 750 mg via INTRAVENOUS
  Filled 2011-05-18: qty 75

## 2011-05-18 MED ORDER — DIPHENHYDRAMINE HCL 25 MG PO CAPS
25.0000 mg | ORAL_CAPSULE | Freq: Once | ORAL | Status: AC
  Administered 2011-05-18: 25 mg via ORAL

## 2011-05-18 MED ORDER — TRASTUZUMAB CHEMO INJECTION 440 MG
6.0000 mg/kg | Freq: Once | INTRAVENOUS | Status: AC
  Administered 2011-05-18: 357 mg via INTRAVENOUS
  Filled 2011-05-18: qty 17

## 2011-05-18 MED ORDER — ONDANSETRON 16 MG/50ML IVPB (CHCC)
16.0000 mg | Freq: Once | INTRAVENOUS | Status: AC
  Administered 2011-05-18: 16 mg via INTRAVENOUS

## 2011-05-18 MED ORDER — ACETAMINOPHEN 325 MG PO TABS
650.0000 mg | ORAL_TABLET | Freq: Once | ORAL | Status: AC
  Administered 2011-05-18: 650 mg via ORAL

## 2011-05-18 MED ORDER — HEPARIN SOD (PORK) LOCK FLUSH 100 UNIT/ML IV SOLN
500.0000 [IU] | Freq: Once | INTRAVENOUS | Status: AC | PRN
  Administered 2011-05-18: 500 [IU]
  Filled 2011-05-18: qty 5

## 2011-05-18 MED ORDER — DEXAMETHASONE SODIUM PHOSPHATE 4 MG/ML IJ SOLN
20.0000 mg | Freq: Once | INTRAMUSCULAR | Status: AC
  Administered 2011-05-18: 20 mg via INTRAVENOUS

## 2011-05-18 MED ORDER — LORAZEPAM 2 MG/ML IJ SOLN
0.5000 mg | Freq: Once | INTRAMUSCULAR | Status: AC
  Administered 2011-05-18: 0.5 mg via INTRAVENOUS

## 2011-05-18 MED ORDER — SODIUM CHLORIDE 0.9 % IV SOLN
Freq: Once | INTRAVENOUS | Status: AC
  Administered 2011-05-18: 13:00:00 via INTRAVENOUS

## 2011-05-18 MED ORDER — SODIUM CHLORIDE 0.9 % IJ SOLN
10.0000 mL | INTRAMUSCULAR | Status: DC | PRN
  Administered 2011-05-18: 10 mL
  Filled 2011-05-18: qty 10

## 2011-05-18 NOTE — Patient Instructions (Signed)
[facility] Discharge Instructions for Patients Receiving Chemotherapy  Today you received the following chemotherapy agents Taxotere/Carboplatin/Herceptin.  To help prevent nausea and vomiting after your treatment, we encourage you to take your nausea medication as ordered per MD.    If you develop nausea and vomiting that is not controlled by your nausea medication, call the clinic. If it is after clinic hours your family physician or the after hours number for the clinic or go to the Emergency Department.   BELOW ARE SYMPTOMS THAT SHOULD BE REPORTED IMMEDIATELY:  *FEVER GREATER THAN 100.5 F  *CHILLS WITH OR WITHOUT FEVER  NAUSEA AND VOMITING THAT IS NOT CONTROLLED WITH YOUR NAUSEA MEDICATION  *UNUSUAL SHORTNESS OF BREATH  *UNUSUAL BRUISING OR BLEEDING  TENDERNESS IN MOUTH AND THROAT WITH OR WITHOUT PRESENCE OF ULCERS  *URINARY PROBLEMS  *BOWEL PROBLEMS  UNUSUAL RASH Items with * indicate a potential emergency and should be followed up as soon as possible.  . Please let the nurse know about any problems that you may have experienced. Feel free to call the clinic you have any questions or concerns. The clinic phone number is [phone].   I have been informed and understand all the instructions given to me. I know to contact the clinic, my physician, or go to the Emergency Department if any problems should occur. I do not have any questions at this time, but understand that I may call the clinic during office hours   should I have any questions or need assistance in obtaining follow up care.    __________________________________________  _____________  __________ Signature of Patient or Authorized Representative            Date                   Time    __________________________________________ Nurse's Signature    

## 2011-05-19 ENCOUNTER — Ambulatory Visit (HOSPITAL_BASED_OUTPATIENT_CLINIC_OR_DEPARTMENT_OTHER): Payer: BC Managed Care – PPO

## 2011-05-19 DIAGNOSIS — N63 Unspecified lump in unspecified breast: Secondary | ICD-10-CM

## 2011-05-19 DIAGNOSIS — Z5189 Encounter for other specified aftercare: Secondary | ICD-10-CM

## 2011-05-19 DIAGNOSIS — C50919 Malignant neoplasm of unspecified site of unspecified female breast: Secondary | ICD-10-CM

## 2011-05-19 MED ORDER — PEGFILGRASTIM INJECTION 6 MG/0.6ML
6.0000 mg | Freq: Once | SUBCUTANEOUS | Status: AC
  Administered 2011-05-19: 6 mg via SUBCUTANEOUS
  Filled 2011-05-19: qty 0.6

## 2011-05-22 ENCOUNTER — Encounter: Payer: Self-pay | Admitting: Family

## 2011-05-22 ENCOUNTER — Ambulatory Visit (HOSPITAL_COMMUNITY): Admission: RE | Admit: 2011-05-22 | Payer: BC Managed Care – PPO | Source: Ambulatory Visit

## 2011-05-22 ENCOUNTER — Other Ambulatory Visit (HOSPITAL_COMMUNITY): Payer: BC Managed Care – PPO

## 2011-05-22 NOTE — Progress Notes (Signed)
Hematology and Oncology Follow Up Visit  Shatina Streets [id number] [birth year] 41 y.o. 05/18/2011    HPI: Palpated a mass in the right breast which she brought to Dr. Verdell Carmine attention.Prior screening mammogram 02/06/2010 had been unremarkable. Had bilateral diagnostic mammogram and right breast ultrasonography 12/22/2010 which showed  an ill defined mass with heterogeneous calcifications in the inner lower right breast, measuring 1.7 cm. The breasts are heterogeneously dense, no other mammographic abnormalities in either breast. Clinically, there was a palpable firm mass at the 5:00 position of the right breast, 5 cm from the nipple.  Ultrasound confirmed a 1.7 cm irregular, hypoechoic mass in the same position. No palpable abnormalities in the right axilla. Lymphadenopathy noted on imaging, largest node measuring 1.6 cm with a minimally thickened cortex inferiorly.  Ultrasound-guided core biopsy 12/23/2010 [phone]) showed an invasive ductal carcinoma, grade 2, ER positive at 88%, PR positive at 11 is %, HER-2/neu positive with a FISH ratio of 4.61, and an MIB-1 of 36%. BRCA 1/2 negative.  Was referred to Dr. Claud Kelp, bilateral breast MRI done 12/30/2010 showed the mass in question in the right breast to measure 2.0 cm proximally. In the right axilla,a 1.8 cm lymph node with a thickened cortex. Left breast unremarkable.  Patient was seen in the multidisciplinary breast cancer clinic and neoadjuvant chemotherapy was decided upon, to consist of 6 q. three-week cycles of carboplatin, docetaxel, and trastuzumab, with Neulasta given on day 2 for granulocyte support.  Interim History:  Patient returns today accompanied by her husband for last planned round of every 3 week doses of neoadjuvant docetaxel/carboplatin/trastuzumab. She will return tomorrow for Neulasta injection.   Upper respiratory infection noted on last visit has resolved. No residual sinus congestion or cough. No fevers,  sweats or chills. No headache or blurred vision, no abdominal pain. No new bone pain.   Had an appt with Dr. Derrell Lolling this week to plan surgery. Still has numerous questions regarding reconstruction, asking for definite answers about risk of local recurrence with each option. Has seen Plastic Surgery several times, has gotten 2 different opinions. Has follow-up with Dr. Kelly Splinter 05/30/11 and I encourage her to make a decision and move forward, in light of the fact this is the last chemotherapy, and definitive surgery should be done in the next month.   A detailed review of systems is otherwise noncontributory as noted below.  Review of Systems: Constitutional: fatigue, weakness, no weight loss, fever, night sweats Eyes: negative HYQ:MVHQI congestion and sinus pain, sore throat Cardiovascular: no chest pain or dyspnea on exertion Respiratory: no cough, shortness of breath, or wheezing Neurological: negative Dermatological: negative Gastrointestinal: recent history of  constipation, resolved with the use of laxatives, now with diarrhea. no abdominal pain, nausea,  or black or bloody stools Genito-Urinary: negative Hematological and Lymphatic: negative Breast: No self-detected changes in the breast.  Musculoskeletal: negative Remaining ROS negative.    Medications: I have reviewed the patient's current medications. Current Outpatient Prescriptions  Medication Sig Dispense Refill  . aspirin-acetaminophen-caffeine (EXCEDRIN MIGRAINE) 250-250-65 MG per tablet Take 1 tablet by mouth 1 day or 1 dose.        [name] dexamethasone (DECADRON) 1 MG tablet Take 1 mg by mouth daily with breakfast. Takes several days prior to chemotherapy.       . docusate sodium (COLACE) 100 MG capsule Take 100 mg by mouth 2 (two) times daily as needed.      [name] guaiFENesin (MUCINEX) 600 MG 12 hr tablet Take 1,200 mg by mouth  2 (two) times daily.        . hydrOXYzine (ATARAX/VISTARIL) 25 MG tablet Take 25 mg by mouth 3 (three)  times daily as needed.        [name] ibuprofen (ADVIL,MOTRIN) 800 MG tablet Take 800 mg by mouth as needed.      . lidocaine-prilocaine (EMLA) cream Apply 1 application topically as needed.      . loratadine (CLARITIN) 10 MG tablet Take 10 mg by mouth 2 (two) times daily.        [name] LORazepam (ATIVAN) 1 MG tablet Take 0.5 tablets (0.5 mg total) by mouth 2 (two) times daily as needed for anxiety.  30 tablet  0  . metoCLOPramide (REGLAN) 10 MG tablet Take 10 mg by mouth 4 (four) times daily.        [name] omeprazole (PRILOSEC) 20 MG capsule Take 20 mg by mouth Twice daily.      . ondansetron (ZOFRAN) 4 MG tablet Take 4 mg by mouth every 8 (eight) hours as needed.        . polyethylene glycol (MIRALAX / GLYCOLAX) packet Take 17 g by mouth daily as needed.      . prenatal vitamin w/FE, FA (PRENATAL 1 + 1) 27-1 MG TABS Take 1 tablet by mouth Daily.      [name] tobramycin-dexamethasone (TOBRADEX) ophthalmic solution Apply 1 drop to eye Twice daily.        Allergies:  Allergies  Allergen Reactions  . Codeine   . Morphine And Related   . Penicillins   . Tussionex Pennkinetic Er (Chlorpheniramine-Hydrocodone)     Physical Exam: Filed Vitals:   05/18/11 1102  BP: 108/73  Pulse: 83  Temp: 98 F (36.7 C)  Body mass index is 23.88 kg/(m^2).  HEENT:  Sclerae anicteric, conjunctivae pink.  Oropharynx clear.  No mucositis or candidiasis.  No pharyngeal erythema or exudate noted Nodes:  No cervical, supraclavicular, or axillary lymphadenopathy palpated.  Breast Exam:  deferred  Lungs:  Clear to auscultation bilaterally.  No crackles, rhonchi, or wheezes.   Heart:  Regular rate and rhythm.   Abdomen:  Soft, nontender. Positive bowel sounds.  No organomegaly or masses palpated.   Musculoskeletal:  No focal spinal tenderness to palpation.  Extremities:  Benign.  No peripheral edema or cyanosis.   Neuro:  Nonfocal. Skin: Benign.  Lab Results: Lab Results  Component Value Date   WBC 9.5 05/18/2011   HGB 10.6*  05/18/2011   HCT 31.2* 05/18/2011   MCV 104.0* 05/18/2011   PLT 220 05/18/2011   NEUTROABS 8.1* 05/18/2011     Chemistry      Component Value Date/Time   NA 141 05/18/2011 1039   K 3.8 05/18/2011 1039   CL 103 05/18/2011 1039   CO2 28 05/18/2011 1039   BUN 13 05/18/2011 1039   CREATININE 0.70 05/18/2011 1039      Component Value Date/Time   CALCIUM 9.8 05/18/2011 1039   ALKPHOS 73 05/18/2011 1039   AST 21 05/18/2011 1039   ALT 26 05/18/2011 1039   BILITOT 0.3 05/18/2011 1039     Assessment:  41 year old woman with:  1. Right breast cancer, receiving neoadjuvant therapy with q 3 week docetaxel/carboplatin/trastuzumab with good tolerance.  This is the last scheduled carbo/Taxol treatment. Herceptin will continue for 1 year.  2. Difficulty with decision-making regarding reconstruction options. She is still in "information gathering" phase, and I encourage her to make a decision so a timely operation can be completed.  3. Recent MR (05/22/11)  showed good response to chemotherapy.  Plan:  1. Keep upcoming appt with Dr. Kelly Splinter to discuss reconstruction. 2. Last treatment of Carbo/Taxol/Herceptin today. Herceptin only in 3 weeks.  3. Keep appt with Dr. Darnelle Catalan Feb. 14.  4. Neulasta injection tomorrow.

## 2011-05-25 ENCOUNTER — Other Ambulatory Visit: Payer: BC Managed Care – PPO | Admitting: Lab

## 2011-05-25 ENCOUNTER — Telehealth: Payer: Self-pay | Admitting: *Deleted

## 2011-05-25 ENCOUNTER — Ambulatory Visit (HOSPITAL_BASED_OUTPATIENT_CLINIC_OR_DEPARTMENT_OTHER): Payer: BC Managed Care – PPO | Admitting: Oncology

## 2011-05-25 VITALS — BP 106/73 | HR 89 | Temp 98.1°F | Ht 63.0 in | Wt 133.8 lb

## 2011-05-25 DIAGNOSIS — C50919 Malignant neoplasm of unspecified site of unspecified female breast: Secondary | ICD-10-CM

## 2011-05-25 LAB — CBC WITH DIFFERENTIAL/PLATELET
BASO%: 0.2 % (ref 0.0–2.0)
Basophils Absolute: 0 10*3/uL (ref 0.0–0.1)
EOS%: 0.1 % (ref 0.0–7.0)
Eosinophils Absolute: 0 10*3/uL (ref 0.0–0.5)
HCT: 32.9 % — ABNORMAL LOW (ref 34.8–46.6)
HGB: 11.1 g/dL — ABNORMAL LOW (ref 11.6–15.9)
LYMPH%: 31.3 % (ref 14.0–49.7)
MCH: 35.3 pg — ABNORMAL HIGH (ref 25.1–34.0)
MCHC: 33.7 g/dL (ref 31.5–36.0)
MCV: 104.8 fL — ABNORMAL HIGH (ref 79.5–101.0)
MONO#: 1.2 10*3/uL — ABNORMAL HIGH (ref 0.1–0.9)
MONO%: 15.8 % — ABNORMAL HIGH (ref 0.0–14.0)
NEUT#: 3.8 10*3/uL (ref 1.5–6.5)
NEUT%: 52.6 % (ref 38.4–76.8)
Platelets: 189 10*3/uL (ref 145–400)
RBC: 3.14 10*6/uL — ABNORMAL LOW (ref 3.70–5.45)
RDW: 15.6 % — ABNORMAL HIGH (ref 11.2–14.5)
WBC: 7.3 10*3/uL (ref 3.9–10.3)
lymph#: 2.3 10*3/uL (ref 0.9–3.3)

## 2011-05-25 NOTE — Progress Notes (Signed)
ID: [name]   DOB: 1971-02-03  MR#: [id number]  ION#:[id number]  HISTORY OF PRESENT ILLNESS: [name] (goes by "[name]") is a [place] woman who, at the age of 41, was referred by Dr. Coral Ceo for treatment of right breast carcinoma.  The patient palpated a mass in her right breast which she brought to Dr. Verdell Carmine attention. A prior screening mammogram on 02/06/2010 had been unremarkable. The patient was then scheduled for bilateral diagnostic mammogram and right breast ultrasonography on 12/22/2010, the studies demonstrating an ill defined mass with heterogeneous calcifications in the inner lower right breast, measuring 1.7 cm. The breasts are heterogeneously dense, but there were no other mammographic abnormalities in either breast. Clinically, there was a palpable firm mass at the 5:00 position of the right breast, 5 cm from the nipple. There were no palpable abnormalities in the right axilla. Ultrasound confirmed a 1.7 cm irregular, hypoechoic mass in the same position. There were some lymph nodes noted in the right exam of, one measuring 1.6 cm with a minimally thickened cortex inferiorly.  An ultrasound-guided core biopsy on 12/23/2010 [phone]) showed an invasive ductal carcinoma, grade 2, ER positive at 88%, PR positive at 11 is %, HER-2/neu positive with a FISH ratio of 4.61, and an MIB-1 of 36%.  The patient was referred to Dr. Claud Kelp and bilateral breast MRIs were obtained 12/30/2010. This showed the mass in question in the right breast to measure 2.0 cm. In the right axilla there was a 1.8 cm lymph node with a thickened cortex. The left breast was unremarkable.   INTERVAL HISTORY: The patient returns for followup of her breast cancer. She is currently day 8 cycle 6 of her TAC neoadjuvant chemotherapy.  REVIEW OF SYSTEMS: Overall [name] has done quite well with chemotherapy. This final cycle she says was "the best". Although she is perhaps a  little bit more fatigued than before. She did have some pain in the legs after her Neulasta, particularly in the calves, for a few days. She treated this with ibuprofen. Sometimes she has pain in the right breast, which is very fleeting. She has seasonal allergies, and sometimes a little back pain. She feels anxious. Otherwise a detailed review of systems today was unremarkable. She has had no menstrual periods since December 2012. She is having moderate hot flashes.  PAST MEDICAL HISTORY: Past Medical History  Diagnosis Date  . Migraines   . Asthma     as a child  . Breast cancer Sept 2012    right breast  1. History of prior adenoidectomy. 2. History of rhinoplasty.   3. History of multiple uterine cervical treatments for precancerous lesions, none for the past 10 years or so. 4. History of fibroid tumor surgery. 5. History of D and C. 6. History of dysplastic nevi removed by Dr. Yetta Barre. 7. History of right "lazy eye" corrective surgery. 8. History of approximately 10-pack-years of tobacco abuse, resolved.  9. History of possible migraines (morning headaches). History of asthma as a child.  PAST SURGICAL HISTORY: Past Surgical History  Procedure Date  . Rhinoplasty   . Adenoidectomy   . Fibroid tumor surgery   . Dilation and curettage of uterus   . Uterine cervical treatments 2002    for precancerous lesions  . Lazy eye     corrective surgery    FAMILY HISTORY Family History  Problem Relation Age of Onset  . Adopted: Yes  . Heart attack Father   :  The  patient's biological father died at the age of 42 from myocardial infarction.  The patient's biological mother is alive at age 42.  The patient was adopted by her biological mother's parents.  She has 3 half-brothers.  No sisters.  One cousin on her mother's side died from melanoma at the age of 70.  There is other cancer history in the adopted side but these are not even half-brothers or sisters, they are children of her  adopted parents who are really her grandparents so these would be uncles and aunts (1 lymph node cancer, 1 prostate cancer, 1 breast cancer at the age of 66).  GYNECOLOGIC HISTORY: She had menarche age 41 or 41.  She is still having periods, the most recent 1 being September 13.  She is to have an IUD in place.  Currently is still using oral contraceptives and she has been advised to discontinue this.  She is GX, P1, first pregnancy to term at age 41.  SOCIAL HISTORY: She works in a Agricultural consultant.  Her husband, Welma Mccombs owns a family business called [employer].  They have been married 5 years.  They have a son, [name], age 42.  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  The patient is not a church attender.    ADVANCED DIRECTIVES:  HEALTH MAINTENANCE: History  Substance Use Topics  . Smoking status: Former Smoker    Quit date: 04/10/1998  . Smokeless tobacco: Never Used  . Alcohol Use: No     Colonoscopy:  PAP:  Bone density:  Lipid panel:  Allergies  Allergen Reactions  . Codeine   . Morphine And Related   . Penicillins   . Tussionex Pennkinetic Er (Chlorpheniramine-Hydrocodone)     Current Outpatient Prescriptions  Medication Sig Dispense Refill  . aspirin-acetaminophen-caffeine (EXCEDRIN MIGRAINE) 250-250-65 MG per tablet Take 1 tablet by mouth 1 day or 1 dose.        . Biotin 10 MG TABS Take 1 tablet by mouth daily.      [name] docusate sodium (COLACE) 100 MG capsule Take 100 mg by mouth 2 (two) times daily as needed.      [name] guaiFENesin (MUCINEX) 600 MG 12 hr tablet Take 1,200 mg by mouth 2 (two) times daily.        . hydrOXYzine (ATARAX/VISTARIL) 25 MG tablet Take 25 mg by mouth 3 (three) times daily as needed.        [name] ibuprofen (ADVIL,MOTRIN) 800 MG tablet Take 800 mg by mouth as needed.      . lidocaine-prilocaine (EMLA) cream Apply 1 application topically as needed.      .  loratadine (CLARITIN) 10 MG tablet Take 10 mg by mouth 2 (two) times daily.        [name] LORazepam (ATIVAN) 1 MG tablet Take 0.5 tablets (0.5 mg total) by mouth 2 (two) times daily as needed for anxiety.  30 tablet  0  . polyethylene glycol (MIRALAX / GLYCOLAX) packet Take 17 g by mouth daily as needed.      . prenatal vitamin w/FE, FA (PRENATAL 1 + 1) 27-1 MG TABS Take 1 tablet by mouth Daily.      [name] tobramycin-dexamethasone (TOBRADEX) ophthalmic solution Apply 1 drop to eye Twice daily.        OBJECTIVE: Filed Vitals:   05/25/11 1428  BP: 106/73  Pulse: 89  Temp: 98.1 F (36.7  C)     Body mass index is 23.70 kg/(m^2).    ECOG FS: 0  Sclerae unicteric Oropharynx clear No peripheral adenopathy, specifically the right axilla is unremarkable Lungs no rales or rhonchi Heart regular rate and rhythm Abd benign MSK no focal spinal tenderness, no peripheral edema Neuro: nonfocal Breasts: I do not palpate a well delineated mass in the right breast. There are no skin changes or nipple retraction. The left breast is unremarkable  LAB RESULTS: Lab Results  Component Value Date   WBC 7.3 05/25/2011   NEUTROABS 3.8 05/25/2011   HGB 11.1* 05/25/2011   HCT 32.9* 05/25/2011   MCV 104.8* 05/25/2011   PLT 189 05/25/2011      Chemistry      Component Value Date/Time   NA 141 05/18/2011 1039   K 3.8 05/18/2011 1039   CL 103 05/18/2011 1039   CO2 28 05/18/2011 1039   BUN 13 05/18/2011 1039   CREATININE 0.70 05/18/2011 1039      Component Value Date/Time   CALCIUM 9.8 05/18/2011 1039   ALKPHOS 73 05/18/2011 1039   AST 21 05/18/2011 1039   ALT 26 05/18/2011 1039   BILITOT 0.3 05/18/2011 1039       No results found for this basename: INR:1;PROTIME:1 in the last 168 hours  No results found for this basename: UACOL:1,UAPR:1,USPG:1,UPH:1,UTP:1,UGL:1,UKET:1,UBIL:1,UHGB:1,UNIT:1,UROB:1,ULEU:1,UEPI:1,UWBC:1,URBC:1,UBAC:1,CAST:1,CRYS:1,UCOM:1,BILUA:1 in the last 72 hours   STUDIES: No new results  found.  ASSESSMENT: 41 year old [place], [name] woman,   (1) status post right breast biopsy in September 2012 for a clinically 2 cm invasive ductal carcinoma, with some enlarged Right axillary lymph nodes, the largest one of which was negative by biopsy. The tumor was grade 2, estrogen receptor positive at 88%, progesterone receptor positive at 11%, HER-2/neu positive by CISH with a ratio of 4.61, and a proliferation marker of 36%.   (2) treated in the neoadjuvant setting with Q 3 week docetaxel/ carboplatin/ trastuzumab x6, completed 05/18/2011   PLAN: She is now ready for her definitive surgery. Because she is planning to have a mastectomy, the breast MRI is not being repeated. She is working closely with plastics to try to make a definitive decision regarding whether she wants both breasts removed and what type of reconstruction she will want.  In the meantime we are continuing that trastuzumab every 3 weeks. Next dose will be February 28. She has a repeat echocardiogram scheduled for 05/26/2011, and we will continue those on a every 3 month basis under Dan benSimhon. The patient knows to call for any problems that may develop before her next visit here, which is tentatively scheduled for the second half of March. Oletha Tolson C    05/25/2011

## 2011-05-25 NOTE — Telephone Encounter (Signed)
gave patient appointments for 05-2011 thru 10-2011 printed out calendar and gave to the patient

## 2011-05-26 ENCOUNTER — Ambulatory Visit (HOSPITAL_COMMUNITY)
Admission: RE | Admit: 2011-05-26 | Discharge: 2011-05-26 | Disposition: A | Discharge status: Home / Self Care | Payer: BC Managed Care – PPO | Source: Ambulatory Visit | Attending: Physician Assistant | Admitting: Physician Assistant

## 2011-05-26 ENCOUNTER — Ambulatory Visit (HOSPITAL_COMMUNITY)
Admission: RE | Admit: 2011-05-26 | Discharge: 2011-05-26 | Disposition: A | Discharge status: Home / Self Care | Payer: BC Managed Care – PPO | Source: Ambulatory Visit | Attending: Internal Medicine | Admitting: Internal Medicine

## 2011-05-26 VITALS — BP 98/52 | HR 88 | Wt 133.2 lb

## 2011-05-26 DIAGNOSIS — N63 Unspecified lump in unspecified breast: Secondary | ICD-10-CM

## 2011-05-26 DIAGNOSIS — I059 Rheumatic mitral valve disease, unspecified: Secondary | ICD-10-CM

## 2011-05-26 DIAGNOSIS — Z01810 Encounter for preprocedural cardiovascular examination: Secondary | ICD-10-CM | POA: Insufficient documentation

## 2011-05-26 DIAGNOSIS — C50919 Malignant neoplasm of unspecified site of unspecified female breast: Secondary | ICD-10-CM | POA: Insufficient documentation

## 2011-05-26 NOTE — Patient Instructions (Signed)
Continue current medications.  Follow up 3 months with echo.

## 2011-05-26 NOTE — Progress Notes (Signed)
HPI:  [name] is a 41 yo with recently diagnosed (12/22/2010) right invasive ductal carcinoma, grade 2, estrogen receptor positive, progesterone receptor positive and Her2 amplification.  She was started on carboplatin and docetaxel together with Herceptin every 3 weeks x6 (first dose last week) then Herceptin is to be continued for a total of 1 year.  She was referred to Dr. Gala [name] for close monitoring for cardiotoxicity while on herceptin.    Echo: 01/19/11:  EF 50-55%.  Lateral S' velocity 10.5 cm/s 05/26/11: EF 55%. Lateral s' velocity 10.7 to 12.9 cm/s  She returns for follow up today with echo.  She has finished her chemotherapy and is to complete herceptin q3 weeks for 1 year.  She feels good.  No dyspnea/orthopnea/PND.  Occasional lower extremity edema after chemo therapy.  Can now schedule breast surgery.       ROS: All other systems normal except as mentioned in HPI, past medical history and problem list.    Past Medical History  Diagnosis Date  . Migraines   . Asthma     as a child  . Breast cancer Sept 2012    right breast    Current Outpatient Prescriptions  Medication Sig Dispense Refill  . aspirin-acetaminophen-caffeine (EXCEDRIN MIGRAINE) 250-250-65 MG per tablet Take 1 tablet by mouth 1 day or 1 dose.        . Biotin 10 MG TABS Take 1 tablet by mouth daily.      [name] docusate sodium (COLACE) 100 MG capsule Take 100 mg by mouth 2 (two) times daily as needed.      [name] guaiFENesin (MUCINEX) 600 MG 12 hr tablet Take 1,200 mg by mouth 2 (two) times daily.        . hydrOXYzine (ATARAX/VISTARIL) 25 MG tablet Take 25 mg by mouth 3 (three) times daily as needed.        [name] ibuprofen (ADVIL,MOTRIN) 800 MG tablet Take 800 mg by mouth as needed.      . lidocaine-prilocaine (EMLA) cream Apply 1 application topically as needed.      . loratadine (CLARITIN) 10 MG tablet Take 10 mg by mouth 2 (two) times daily.        [name] LORazepam (ATIVAN) 1 MG tablet Take 0.5 tablets (0.5 mg total)  by mouth 2 (two) times daily as needed for anxiety.  30 tablet  0  . polyethylene glycol (MIRALAX / GLYCOLAX) packet Take 17 g by mouth daily as needed.      . prenatal vitamin w/FE, FA (PRENATAL 1 + 1) 27-1 MG TABS Take 1 tablet by mouth Daily.      [name] tobramycin-dexamethasone (TOBRADEX) ophthalmic solution Apply 1 drop to eye Twice daily.        Allergies  Allergen Reactions  . Codeine   . Morphine And Related   . Penicillins   . Tussionex Pennkinetic Er (Chlorpheniramine-Hydrocodone)     PHYSICAL EXAM: Filed Vitals:   05/26/11 1013  BP: 98/52  Pulse: 88  Weight: 133 lb 4 oz (60.442 kg)  SpO2: 100%   General:  Well appearing. No respiratory difficulty HEENT: normal Neck: supple. Flat JVD. Carotids 2+ bilat; no bruits. No lymphadenopathy or thryomegaly appreciated. Cor: PMI nondisplaced. Regular rate & rhythm. No rubs, gallops or murmurs. Lungs: clear Abdomen: soft, nontender, nondistended. No hepatosplenomegaly. No bruits or masses. Good bowel sounds. Extremities: no cyanosis, clubbing, rash, edema, port site below mid clavicular area on right side Neuro: alert & oriented x 3, cranial nerves grossly intact. moves  all 4 extremities w/o difficulty. Affect pleasant.   ASSESSMENT & PLAN:

## 2011-05-26 NOTE — Assessment & Plan Note (Addendum)
Reviewed today's echo with [name].  Her EF remains good will continue with herceptin therapy.  Follow up 3 months with echo.    Patient seen and examined with Judith Blossom PA-C. We discussed all aspects of the encounter. I agree with the assessment and plan as stated above.  Echo looks good. Continue therapy.

## 2011-06-06 ENCOUNTER — Encounter (INDEPENDENT_AMBULATORY_CARE_PROVIDER_SITE_OTHER): Payer: Self-pay | Admitting: General Surgery

## 2011-06-06 ENCOUNTER — Ambulatory Visit (INDEPENDENT_AMBULATORY_CARE_PROVIDER_SITE_OTHER): Payer: BC Managed Care – PPO | Admitting: General Surgery

## 2011-06-06 ENCOUNTER — Encounter (INDEPENDENT_AMBULATORY_CARE_PROVIDER_SITE_OTHER): Payer: BC Managed Care – PPO | Admitting: General Surgery

## 2011-06-06 DIAGNOSIS — C50919 Malignant neoplasm of unspecified site of unspecified female breast: Secondary | ICD-10-CM

## 2011-06-06 NOTE — Progress Notes (Addendum)
Patient ID: [name], female   DOB: [birth year], 41 y.o.   MRN: [id number]  Chief Complaint  Patient presents with  . Breast Cancer    discuss br ca sx    HPI [name] is a 41 y.o. female.    This very pleasant 41 year old female returns with her husband to discuss definitive surgical options for her right breast cancer.   She was diagnosed with cancer of the right breast on December 23 2010. There was a 2.0 cm tumor in the right breast, lower inner quadrant which was invasive ductal carcinoma, T1c, N0 in the posterior third. This was receptor positive and HER-2 positive. A biopsy of the right axilla lymph node was negative. Genetic testing is negative for BRCA1/BRCA2.   She has been uncertain as to the extent of the surgery she wanted, and so the decision was made to go ahead and place a Port-A-Cath which was done on January 20, 2011 and to proceed with neoadjuvant chemotherapy under the guidance of Dr. Marikay Alar Magrinat.  She has done well. MRI on March 16, 2011 shows a positive response on the right with decreased size and decreased enhancement with the tumor from about 2.0 cm down to about 1.2 cm. Left breast is normal. There is no adenopathy.  She has seen both Dr. Etter Sjogren and Dr. Shella Spearing . She has talked about every reconstructive option and pathway including unilateral versus bilateral mastectomy, nipple sparing versus skin sparing, latissimus flap, and implant based. She has been ambivalent about what she wants to do.   She returns to see me today, having been told by her oncologist that it is time to go ahead with definitive surgery. I spent one hour with the patient her husband discussing numerous options and answering numerous questions.   At the completion of our conversation today she has decided she wants to have a unilateral mastectomy. She would like to have a skin sparing mastectomy on the right, SLN  biopsy and immediate reconstruction. She wants  Dr. Kelly Splinter   her to do the reconstruction. surgery. At this point in time she thinks she does not want a prophylactic mastectomy on the left.  She knows that she may change her mind. In any event I called Dr. Kelly Splinter  we discussed this. We're going to go ahead and coordinate the surgery scheduling.     HPI  Past Medical History  Diagnosis Date  . Migraines   . Asthma     as a child  . Breast cancer Sept 2012    right breast    Past Surgical History  Procedure Date  . Rhinoplasty   . Adenoidectomy   . Fibroid tumor surgery   . Dilation and curettage of uterus   . Uterine cervical treatments 2002    for precancerous lesions  . Lazy eye     corrective surgery    Family History  Problem Relation Age of Onset  . Adopted: Yes  . Heart attack Father     Social History History  Substance Use Topics  . Smoking status: Former Smoker    Quit date: 04/10/1998  . Smokeless tobacco: Never Used  . Alcohol Use: No    Allergies  Allergen Reactions  . Codeine   . Morphine And Related   . Penicillins   . Tussionex Pennkinetic Er (Chlorpheniramine-Hydrocodone)     Current Outpatient Prescriptions  Medication Sig Dispense Refill  . aspirin-acetaminophen-caffeine (EXCEDRIN MIGRAINE) 250-250-65 MG per tablet Take 1 tablet by mouth  1 day or 1 dose.        . Biotin 10 MG TABS Take 1 tablet by mouth daily.      [name] docusate sodium (COLACE) 100 MG capsule Take 100 mg by mouth 2 (two) times daily as needed.      [name] guaiFENesin (MUCINEX) 600 MG 12 hr tablet Take 1,200 mg by mouth 2 (two) times daily.        . hydrOXYzine (ATARAX/VISTARIL) 25 MG tablet Take 25 mg by mouth 3 (three) times daily as needed.        [name] ibuprofen (ADVIL,MOTRIN) 800 MG tablet Take 800 mg by mouth as needed.      . lidocaine-prilocaine (EMLA) cream Apply 1 application topically as needed.      . loratadine (CLARITIN) 10 MG tablet Take 10 mg by mouth 2 (two) times daily.        [name] LORazepam (ATIVAN) 1 MG tablet Take 0.5 tablets  (0.5 mg total) by mouth 2 (two) times daily as needed for anxiety.  30 tablet  0  . polyethylene glycol (MIRALAX / GLYCOLAX) packet Take 17 g by mouth daily as needed.      . prenatal vitamin w/FE, FA (PRENATAL 1 + 1) 27-1 MG TABS Take 1 tablet by mouth Daily.      [name] tobramycin-dexamethasone (TOBRADEX) ophthalmic solution Apply 1 drop to eye Twice daily.        Review of Systems Review of Systems  Constitutional: Negative for fever, chills and unexpected weight change.  HENT: Negative for hearing loss, congestion, sore throat, trouble swallowing and voice change.   Eyes: Negative for visual disturbance.  Respiratory: Negative for cough and wheezing.   Cardiovascular: Negative for chest pain, palpitations and leg swelling.  Gastrointestinal: Negative for nausea, vomiting, abdominal pain, diarrhea, constipation, blood in stool, abdominal distention and anal bleeding.  Genitourinary: Negative for hematuria, vaginal bleeding and difficulty urinating.  Musculoskeletal: Negative for arthralgias.  Skin: Negative for rash and wound.  Neurological: Negative for seizures, syncope and headaches.  Hematological: Negative for adenopathy. Does not bruise/bleed easily.  Psychiatric/Behavioral: Negative for confusion.    There were no vitals taken for this visit.  Physical Exam Physical Exam  Constitutional: She is oriented to person, place, and time. She appears well-developed and well-nourished. No distress.  HENT:  Head: Normocephalic and atraumatic.  Nose: Nose normal.  Mouth/Throat: No oropharyngeal exudate.  Eyes: Conjunctivae and EOM are normal. Pupils are equal, round, and reactive to light. Left eye exhibits no discharge. No scleral icterus.  Neck: Neck supple. No JVD present. No tracheal deviation present. No thyromegaly present.  Cardiovascular: Normal rate, regular rhythm, normal heart sounds and intact distal pulses.   No murmur heard. Pulmonary/Chest: Effort normal and breath sounds  normal. No respiratory distress. She has no wheezes. She has no rales. She exhibits no tenderness.  Abdominal: Soft. Bowel sounds are normal. She exhibits no distension and no mass. There is no tenderness. There is no rebound and no guarding.  Musculoskeletal: She exhibits no edema and no tenderness.  Lymphadenopathy:    She has no cervical adenopathy.  Neurological: She is alert and oriented to person, place, and time. She exhibits normal muscle tone. Coordination normal.  Skin: Skin is warm. No rash noted. She is not diaphoretic. No erythema. No pallor.  Psychiatric: She has a normal mood and affect. Her behavior is normal. Judgment and thought content normal.    Data Reviewed I reviewed our old records. Telephone conversation with Dr.  Sanger.  Assessment    Invasive ductal carcinoma right breast, lower inner quadrant, pretreatment stage TI C., N0. Receptor positive. HER-2 positive  Status post Port-A-Cath insertion  Status post neoadjuvant chemotherapy    Plan    We'll schedule for right skin sparing mastectomy with sentinel node biopsy, to be followed with immediate reconstruction by Dr. Meliton Rattan.  We once again discussed all of the indications details and risks of the surgery. She understands all this very well.  She will call me back if she changes her mind about the prophylactic contralateral mastectomy.  ADDENDUM:  Patient has called back.  For several reasons, previously discussed, she now requests prophylactic contralateral mastectomy. I agree that thjs is appropriate.  Will amend scheduling for bilateral skin sparing mastectomies, right sentinal node biopsy, to be followed by immediate reconstruction. h Dr. Kelly Splinter.Will contact and coordinate wit       Angelia Mould. Derrell Lolling, M.D., [facility] General and Minimally invasive Surgery Breast and Colorectal Surgery Office:   [phone] Pager:   [phone]  06/06/2011, 3:48 PM

## 2011-06-06 NOTE — Patient Instructions (Signed)
You have decided to proceed with right breast skin sparing mastectomy and sentinel lobe biopsy with immediate reconstruction by Dr. Meliton Rattan.   You   have decided not to have a prophylactic left mastectomy with reconstruction.  If you change her mind, give [place] a call.  We will schedule this surgery and coordinate with Dr. Kelly Splinter in the near future.

## 2011-06-07 ENCOUNTER — Other Ambulatory Visit: Payer: Self-pay | Admitting: *Deleted

## 2011-06-07 ENCOUNTER — Other Ambulatory Visit: Payer: Self-pay | Admitting: Oncology

## 2011-06-07 DIAGNOSIS — Z853 Personal history of malignant neoplasm of breast: Secondary | ICD-10-CM

## 2011-06-08 ENCOUNTER — Other Ambulatory Visit: Payer: Self-pay | Admitting: Pharmacist

## 2011-06-08 ENCOUNTER — Ambulatory Visit (HOSPITAL_BASED_OUTPATIENT_CLINIC_OR_DEPARTMENT_OTHER): Payer: BC Managed Care – PPO

## 2011-06-08 ENCOUNTER — Other Ambulatory Visit: Payer: BC Managed Care – PPO | Admitting: Lab

## 2011-06-08 VITALS — BP 114/74 | HR 89 | Temp 97.4°F

## 2011-06-08 DIAGNOSIS — Z853 Personal history of malignant neoplasm of breast: Secondary | ICD-10-CM

## 2011-06-08 DIAGNOSIS — N63 Unspecified lump in unspecified breast: Secondary | ICD-10-CM

## 2011-06-08 LAB — CBC WITH DIFFERENTIAL/PLATELET
BASO%: 0.3 % (ref 0.0–2.0)
Basophils Absolute: 0 10*3/uL (ref 0.0–0.1)
EOS%: 0.2 % (ref 0.0–7.0)
Eosinophils Absolute: 0 10*3/uL (ref 0.0–0.5)
HCT: 33.8 % — ABNORMAL LOW (ref 34.8–46.6)
HGB: 11.4 g/dL — ABNORMAL LOW (ref 11.6–15.9)
LYMPH%: 28 % (ref 14.0–49.7)
MCH: 35.5 pg — ABNORMAL HIGH (ref 25.1–34.0)
MCHC: 33.6 g/dL (ref 31.5–36.0)
MCV: 105.6 fL — ABNORMAL HIGH (ref 79.5–101.0)
MONO#: 0.6 10*3/uL (ref 0.1–0.9)
MONO%: 9.9 % (ref 0.0–14.0)
NEUT#: 4 10*3/uL (ref 1.5–6.5)
NEUT%: 61.6 % (ref 38.4–76.8)
Platelets: 130 10*3/uL — ABNORMAL LOW (ref 145–400)
RBC: 3.2 10*6/uL — ABNORMAL LOW (ref 3.70–5.45)
RDW: 16.5 % — ABNORMAL HIGH (ref 11.2–14.5)
WBC: 6.6 10*3/uL (ref 3.9–10.3)
lymph#: 1.8 10*3/uL (ref 0.9–3.3)

## 2011-06-08 MED ORDER — TRASTUZUMAB CHEMO INJECTION 440 MG
6.0000 mg/kg | Freq: Once | INTRAVENOUS | Status: AC
  Administered 2011-06-08: 357 mg via INTRAVENOUS
  Filled 2011-06-08: qty 17

## 2011-06-08 MED ORDER — SODIUM CHLORIDE 0.9 % IJ SOLN
10.0000 mL | INTRAMUSCULAR | Status: DC | PRN
  Administered 2011-06-08: 10 mL
  Filled 2011-06-08: qty 10

## 2011-06-08 MED ORDER — DIPHENHYDRAMINE HCL 25 MG PO CAPS
25.0000 mg | ORAL_CAPSULE | Freq: Once | ORAL | Status: AC
  Administered 2011-06-08: 25 mg via ORAL

## 2011-06-08 MED ORDER — ACETAMINOPHEN 325 MG PO TABS
650.0000 mg | ORAL_TABLET | Freq: Once | ORAL | Status: AC
  Administered 2011-06-08: 650 mg via ORAL

## 2011-06-08 MED ORDER — SODIUM CHLORIDE 0.9 % IV SOLN
Freq: Once | INTRAVENOUS | Status: AC
  Administered 2011-06-08: 15:00:00 via INTRAVENOUS

## 2011-06-08 MED ORDER — HEPARIN SOD (PORK) LOCK FLUSH 100 UNIT/ML IV SOLN
500.0000 [IU] | Freq: Once | INTRAVENOUS | Status: AC | PRN
  Administered 2011-06-08: 500 [IU]
  Filled 2011-06-08: qty 5

## 2011-06-21 ENCOUNTER — Encounter (HOSPITAL_COMMUNITY)
Admission: RE | Admit: 2011-06-21 | Discharge: 2011-06-21 | Disposition: A | Discharge status: Home / Self Care | Payer: BC Managed Care – PPO | Source: Ambulatory Visit | Attending: Anesthesiology | Admitting: Anesthesiology

## 2011-06-21 ENCOUNTER — Other Ambulatory Visit: Payer: Self-pay

## 2011-06-21 ENCOUNTER — Encounter (HOSPITAL_COMMUNITY): Payer: Self-pay

## 2011-06-21 ENCOUNTER — Encounter (HOSPITAL_COMMUNITY)
Admission: RE | Admit: 2011-06-21 | Discharge: 2011-06-21 | Disposition: A | Discharge status: Home / Self Care | Payer: BC Managed Care – PPO | Source: Ambulatory Visit | Attending: General Surgery | Admitting: General Surgery

## 2011-06-21 ENCOUNTER — Ambulatory Visit (HOSPITAL_COMMUNITY): Admission: RE | Admit: 2011-06-21 | Payer: BC Managed Care – PPO | Source: Ambulatory Visit

## 2011-06-21 HISTORY — DX: Nausea with vomiting, unspecified: R11.2

## 2011-06-21 HISTORY — DX: Nausea with vomiting, unspecified: Z98.890

## 2011-06-21 LAB — CBC
HCT: 35.9 % — ABNORMAL LOW (ref 36.0–46.0)
Hemoglobin: 12.3 g/dL (ref 12.0–15.0)
MCH: 35.4 pg — ABNORMAL HIGH (ref 26.0–34.0)
MCHC: 34.3 g/dL (ref 30.0–36.0)
MCV: 103.5 fL — ABNORMAL HIGH (ref 78.0–100.0)
Platelets: 212 10*3/uL (ref 150–400)
RBC: 3.47 MIL/uL — ABNORMAL LOW (ref 3.87–5.11)
RDW: 13.7 % (ref 11.5–15.5)
WBC: 4.7 10*3/uL (ref 4.0–10.5)

## 2011-06-21 LAB — COMPREHENSIVE METABOLIC PANEL
ALT: 23 U/L (ref 0–35)
AST: 28 U/L (ref 0–37)
Albumin: 4 g/dL (ref 3.5–5.2)
Alkaline Phosphatase: 79 U/L (ref 39–117)
BUN: 10 mg/dL (ref 6–23)
CO2: 23 mEq/L (ref 19–32)
Calcium: 10 mg/dL (ref 8.4–10.5)
Chloride: 103 mEq/L (ref 96–112)
Creatinine, Ser: 0.77 mg/dL (ref 0.50–1.10)
GFR calc Af Amer: >90 mL/min (ref >90)
GFR calc non Af Amer: >90 mL/min (ref >90)
Glucose, Bld: 72 mg/dL (ref 70–99)
Potassium: 4.1 mEq/L (ref 3.5–5.1)
Sodium: 140 mEq/L (ref 135–145)
Total Bilirubin: 0.3 mg/dL (ref 0.3–1.2)
Total Protein: 6.5 g/dL (ref 6.0–8.3)

## 2011-06-21 LAB — BASIC METABOLIC PANEL
BUN: 10 mg/dL (ref 6–23)
CO2: 27 mEq/L (ref 19–32)
Calcium: 9.8 mg/dL (ref 8.4–10.5)
Chloride: 103 mEq/L (ref 96–112)
Creatinine, Ser: 0.75 mg/dL (ref 0.50–1.10)
GFR calc Af Amer: >90 mL/min (ref >90)
GFR calc non Af Amer: >90 mL/min (ref >90)
Glucose, Bld: 75 mg/dL (ref 70–99)
Potassium: 4.1 mEq/L (ref 3.5–5.1)
Sodium: 140 mEq/L (ref 135–145)

## 2011-06-21 LAB — SURGICAL PCR SCREEN
MRSA, PCR: NEGATIVE
Staphylococcus aureus: NEGATIVE

## 2011-06-21 LAB — HCG, SERUM, QUALITATIVE: Preg, Serum: NEGATIVE

## 2011-06-21 MED ORDER — CHLORHEXIDINE GLUCONATE 4 % EX LIQD: 1.0000 "application " | Freq: Once | CUTANEOUS | Status: DC

## 2011-06-23 ENCOUNTER — Encounter (HOSPITAL_COMMUNITY): Payer: Self-pay | Admitting: Pharmacy Technician

## 2011-06-26 ENCOUNTER — Other Ambulatory Visit: Payer: Self-pay | Admitting: Physician Assistant

## 2011-06-26 ENCOUNTER — Other Ambulatory Visit: Payer: Self-pay | Admitting: Plastic Surgery

## 2011-06-26 DIAGNOSIS — C50919 Malignant neoplasm of unspecified site of unspecified female breast: Secondary | ICD-10-CM

## 2011-06-26 DIAGNOSIS — N63 Unspecified lump in unspecified breast: Secondary | ICD-10-CM

## 2011-06-26 NOTE — Progress Notes (Signed)
Called and left message at office for Dr. Kelly Splinter requested orders.

## 2011-06-27 ENCOUNTER — Other Ambulatory Visit: Payer: Self-pay | Admitting: Nurse Practitioner

## 2011-06-27 ENCOUNTER — Encounter: Payer: Self-pay | Admitting: *Deleted

## 2011-06-27 ENCOUNTER — Encounter: Payer: Self-pay | Admitting: Oncology

## 2011-06-27 DIAGNOSIS — C50919 Malignant neoplasm of unspecified site of unspecified female breast: Secondary | ICD-10-CM

## 2011-06-27 MED ORDER — IBUPROFEN 800 MG PO TABS: 800.0000 mg | ORAL_TABLET | Freq: Four times a day (QID) | ORAL | Status: DC | PRN

## 2011-06-27 NOTE — Progress Notes (Signed)
Called [employer] [id number] ondansetron 8mg  24 tabs has been approved from 06/06/11 - 06/26/12.

## 2011-06-27 NOTE — Progress Notes (Signed)
RECEIVED A FAX FROM [facility] CONCERNING A PRIOR AUTHORIZATION FOR ONDANSETRON. THIS REQUEST WAS PLACED IN THE MANAGED CARE BIN FOR [facility].

## 2011-06-28 ENCOUNTER — Telehealth: Payer: Self-pay | Admitting: *Deleted

## 2011-06-28 ENCOUNTER — Other Ambulatory Visit: Payer: Self-pay | Admitting: *Deleted

## 2011-06-28 MED ORDER — CEFAZOLIN SODIUM 1-5 GM-% IV SOLN
1.0000 g | INTRAVENOUS | Status: DC
  Filled 2011-06-28: qty 50

## 2011-06-28 MED ORDER — VANCOMYCIN HCL IN DEXTROSE 1-5 GM/200ML-% IV SOLN
1000.0000 mg | INTRAVENOUS | Status: DC
  Filled 2011-06-28: qty 200

## 2011-06-28 MED ORDER — HEPARIN SODIUM (PORCINE) 5000 UNIT/ML IJ SOLN
5000.0000 [IU] | Freq: Once | INTRAMUSCULAR | Status: AC
  Administered 2011-06-29: 5000 [IU] via SUBCUTANEOUS
  Filled 2011-06-28: qty 1

## 2011-06-28 NOTE — Telephone Encounter (Signed)
cancelled appointment for 06-29-2011 rescheduled everything for 07-20-2011 starting at 8:15 am with labs

## 2011-06-28 NOTE — Telephone Encounter (Signed)
Pt called to this RN to state she is scheduled for surgery tomorrow and noticed she has appointments for herceptin here as well.  This RN informed pt appts at this clinic will be cancelled and follow up will be rescheduled.  [name] had questions as well regarding " if I have a double mastectomy will I still have to take the tamoxifen ?" And " when do I start the tamoxifen ".  This RN answered above informed pt need for tamoxifen is not based on type of breast surgery as well as generally it is started within 6 weeks of breast surgery.  Request per above appts sent to scheduling.

## 2011-06-28 NOTE — H&P (Signed)
[name]    MRN: [id number]   Description: 41 year old female  Provider: [name], MD  Department: [facility]      Diagnoses     Invasive ductal carcinoma of breast, stage 2   - Primary    174.9        History and Physical   [name], MD  Patient ID: [name], female   DOB: 09-Oct-1970, 41 y.o.   MRN: [id number]    Chief Complaint   Patient presents with   .  Breast Cancer       discuss br ca sx       HPI [name] is a 41 y.o. female.    This very pleasant 41 year old female returns with her husband to discuss definitive surgical options for her right breast cancer.   She was diagnosed with cancer of the right breast on December 23 2010. There was a 2.0 cm tumor in the right breast, lower inner quadrant which was invasive ductal carcinoma, T1c, N0 in the posterior third. This was receptor positive and HER-2 positive. A biopsy of the right axilla lymph node was negative. Genetic testing is negative for BRCA1/BRCA2.   She has been uncertain as to the extent of the surgery she wanted, and so the decision was made to go ahead and place a Port-A-Cath which was done on January 20, 2011 and to proceed with neoadjuvant chemotherapy under the guidance of Dr. Marikay Alar Magrinat.   She has done well. MRI on March 16, 2011 shows a positive response on the right with decreased size and decreased enhancement with the tumor from about 2.0 cm down to about 1.2 cm. Left breast is normal. There is no adenopathy.   She has seen both Dr. Etter Sjogren and Dr. Shella Spearing . She has talked about every reconstructive option and pathway including unilateral versus bilateral mastectomy, nipple sparing versus skin sparing, latissimus flap, and implant based. She has been ambivalent about what she wants to do.   She returns to see me today, having been told by her oncologist that it is time to go ahead with definitive surgery. I spent one hour with the patient her  husband discussing numerous options and answering numerous questions.   At the completion of our conversation today she has decided she wants to have a unilateral mastectomy. She would like to have a skin sparing mastectomy on the right, SLN  biopsy and immediate reconstruction. She wants  Dr. Kelly Splinter  her to do the reconstruction. surgery. At this point in time she thinks she does not want a prophylactic mastectomy on the left.  She knows that she may change her mind. (SEE ADDENDUM BELOW)  In any event I called Dr. Kelly Splinter  we discussed this. We're going to go ahead and coordinate the surgery scheduling.        Past Medical History   Diagnosis  Date   .  Migraines     .  Asthma         as a child   .  Breast cancer  Sept 2012       right breast       Past Surgical History   Procedure  Date   .  Rhinoplasty     .  Adenoidectomy     .  Fibroid tumor surgery     .  Dilation and curettage of uterus     .  Uterine cervical treatments  2002  for precancerous lesions   .  Lazy eye         corrective surgery       Family History   Problem  Relation  Age of Onset   .  Adopted: Yes   .  Heart attack  Father        Social History History   Substance Use Topics   .  Smoking status:  Former Smoker       Quit date:  04/10/1998   .  Smokeless tobacco:  Never Used   .  Alcohol Use:  No       Allergies   Allergen  Reactions   .  Codeine     .  Morphine And Related     .  Penicillins     .  Tussionex Pennkinetic Er (Chlorpheniramine-Hydrocodone)         Current Outpatient Prescriptions   Medication  Sig  Dispense  Refill   .  aspirin-acetaminophen-caffeine (EXCEDRIN MIGRAINE) 250-250-65 MG per tablet  Take 1 tablet by mouth 1 day or 1 dose.           .  Biotin 10 MG TABS  Take 1 tablet by mouth daily.         [name]  docusate sodium (COLACE) 100 MG capsule  Take 100 mg by mouth 2 (two) times daily as needed.         [name]  guaiFENesin (MUCINEX) 600 MG 12 hr tablet  Take 1,200 mg by  mouth 2 (two) times daily.           .  hydrOXYzine (ATARAX/VISTARIL) 25 MG tablet  Take 25 mg by mouth 3 (three) times daily as needed.           [name]  ibuprofen (ADVIL,MOTRIN) 800 MG tablet  Take 800 mg by mouth as needed.         .  lidocaine-prilocaine (EMLA) cream  Apply 1 application topically as needed.         .  loratadine (CLARITIN) 10 MG tablet  Take 10 mg by mouth 2 (two) times daily.           [name]  LORazepam (ATIVAN) 1 MG tablet  Take 0.5 tablets (0.5 mg total) by mouth 2 (two) times daily as needed for anxiety.   30 tablet   0   .  polyethylene glycol (MIRALAX / GLYCOLAX) packet  Take 17 g by mouth daily as needed.         .  prenatal vitamin w/FE, FA (PRENATAL 1 + 1) 27-1 MG TABS  Take 1 tablet by mouth Daily.         [name]  tobramycin-dexamethasone (TOBRADEX) ophthalmic solution  Apply 1 drop to eye Twice daily.            Review of Systems  Constitutional: Negative for fever, chills and unexpected weight change.  HENT: Negative for hearing loss, congestion, sore throat, trouble swallowing and voice change.   Eyes: Negative for visual disturbance.  Respiratory: Negative for cough and wheezing.   Cardiovascular: Negative for chest pain, palpitations and leg swelling.  Gastrointestinal: Negative for nausea, vomiting, abdominal pain, diarrhea, constipation, blood in stool, abdominal distention and anal bleeding.  Genitourinary: Negative for hematuria, vaginal bleeding and difficulty urinating.  Musculoskeletal: Negative for arthralgias.  Skin: Negative for rash and wound.  Neurological: Negative for seizures, syncope and headaches.  Hematological: Negative for adenopathy. Does not bruise/bleed easily.  Psychiatric/Behavioral: Negative for confusion.  Physical Exam   Constitutional: She is oriented to person, place, and time. She appears well-developed and well-nourished. No distress.  HENT:   Head: Normocephalic and atraumatic.   Nose: Nose normal.   Mouth/Throat: No  oropharyngeal exudate.  Eyes: Conjunctivae and EOM are normal. Pupils are equal, round, and reactive to light. Left eye exhibits no discharge. No scleral icterus.  Neck: Neck supple. No JVD present. No tracheal deviation present. No thyromegaly present.  Cardiovascular: Normal rate, regular rhythm, normal heart sounds and intact distal pulses.    No murmur heard. Pulmonary/Chest: Effort normal and breath sounds normal. No respiratory distress. She has no wheezes. She has no rales. She exhibits no tenderness.  Breast: minimal thickening right LIQ. No pathologic adenopathy. Abdominal: Soft. Bowel sounds are normal. She exhibits no distension and no mass. There is no tenderness. There is no rebound and no guarding.  Musculoskeletal: She exhibits no edema and no tenderness.  Lymphadenopathy:    She has no cervical adenopathy.  Neurological: She is alert and oriented to person, place, and time. She exhibits normal muscle tone. Coordination normal.  Skin: Skin is warm. No rash noted. She is not diaphoretic. No erythema. No pallor.  Psychiatric: She has a normal mood and affect. Her behavior is normal. Judgment and thought content normal.     Assessment Invasive ductal carcinoma right breast, lower inner quadrant, pretreatment stage TI C., N0. Receptor positive. HER-2 positive  Status post Port-A-Cath insertion  Status post neoadjuvant chemotherapy   Plan We'll schedule for right skin sparing mastectomy with sentinel node biopsy, to be followed with immediate reconstruction by Dr. Meliton Rattan.  We once again discussed all of the indications details and risks of the surgery. She understands all this very well.  She will call me back if she changes her mind about the prophylactic contralateral mastectomy.  ADDENDUM:  Patient has called back.  For several reasons, previously discussed, she now requests prophylactic contralateral mastectomy. I agree that thjs is appropriate.  Will amend  scheduling for bilateral skin sparing mastectomies, right sentinal node biopsy, to be followed by immediate reconstruction. h Dr. Kelly Splinter.Will contact and coordinate wit       Angelia Mould. Derrell Lolling, M.D., [facility] General and Minimally invasive Surgery Breast and Colorectal Surgery Office:   [phone] Pager:   [phone]

## 2011-06-29 ENCOUNTER — Observation Stay (HOSPITAL_COMMUNITY)
Admission: RE | Admit: 2011-06-29 | Discharge: 2011-06-30 | Disposition: A | Discharge status: Home / Self Care | Payer: BC Managed Care – PPO | Source: Ambulatory Visit | Attending: General Surgery | Admitting: General Surgery

## 2011-06-29 ENCOUNTER — Encounter (HOSPITAL_COMMUNITY): Payer: Self-pay | Admitting: *Deleted

## 2011-06-29 ENCOUNTER — Other Ambulatory Visit: Payer: BC Managed Care – PPO | Admitting: Lab

## 2011-06-29 ENCOUNTER — Encounter (HOSPITAL_COMMUNITY): Payer: Self-pay | Admitting: Anesthesiology

## 2011-06-29 ENCOUNTER — Encounter (HOSPITAL_COMMUNITY)
Admission: RE | Disposition: A | Discharge status: Home / Self Care | Payer: Self-pay | Source: Ambulatory Visit | Attending: General Surgery

## 2011-06-29 ENCOUNTER — Ambulatory Visit: Payer: BC Managed Care – PPO | Admitting: Physician Assistant

## 2011-06-29 ENCOUNTER — Ambulatory Visit (HOSPITAL_COMMUNITY)
Admission: RE | Admit: 2011-06-29 | Discharge: 2011-06-29 | Disposition: A | Discharge status: Home / Self Care | Payer: BC Managed Care – PPO | Source: Ambulatory Visit | Attending: General Surgery | Admitting: General Surgery

## 2011-06-29 ENCOUNTER — Ambulatory Visit: Payer: BC Managed Care – PPO

## 2011-06-29 ENCOUNTER — Ambulatory Visit (HOSPITAL_COMMUNITY): Payer: BC Managed Care – PPO | Admitting: Anesthesiology

## 2011-06-29 DIAGNOSIS — Z01810 Encounter for preprocedural cardiovascular examination: Secondary | ICD-10-CM | POA: Insufficient documentation

## 2011-06-29 DIAGNOSIS — N63 Unspecified lump in unspecified breast: Secondary | ICD-10-CM

## 2011-06-29 DIAGNOSIS — Z01818 Encounter for other preprocedural examination: Secondary | ICD-10-CM | POA: Insufficient documentation

## 2011-06-29 DIAGNOSIS — Z01812 Encounter for preprocedural laboratory examination: Secondary | ICD-10-CM | POA: Insufficient documentation

## 2011-06-29 DIAGNOSIS — C50319 Malignant neoplasm of lower-inner quadrant of unspecified female breast: Principal | ICD-10-CM | POA: Insufficient documentation

## 2011-06-29 DIAGNOSIS — C50919 Malignant neoplasm of unspecified site of unspecified female breast: Secondary | ICD-10-CM

## 2011-06-29 DIAGNOSIS — Z4001 Encounter for prophylactic removal of breast: Secondary | ICD-10-CM

## 2011-06-29 DIAGNOSIS — N6019 Diffuse cystic mastopathy of unspecified breast: Secondary | ICD-10-CM

## 2011-06-29 DIAGNOSIS — Z9221 Personal history of antineoplastic chemotherapy: Secondary | ICD-10-CM | POA: Insufficient documentation

## 2011-06-29 HISTORY — PX: MASTECTOMY W/ SENTINEL NODE BIOPSY: SHX2001

## 2011-06-29 HISTORY — PX: BREAST RECONSTRUCTION: SHX9

## 2011-06-29 SURGERY — MASTECTOMY WITH SENTINEL LYMPH NODE BIOPSY
Anesthesia: General | Site: Chest | Laterality: Right | Wound class: Clean

## 2011-06-29 MED ORDER — LACTATED RINGERS IV SOLN: INTRAVENOUS | Status: DC

## 2011-06-29 MED ORDER — ROCURONIUM BROMIDE 100 MG/10ML IV SOLN
INTRAVENOUS | Status: DC | PRN
  Administered 2011-06-29: 40 mg via INTRAVENOUS
  Administered 2011-06-29: 10 mg via INTRAVENOUS

## 2011-06-29 MED ORDER — ONDANSETRON HCL 4 MG/2ML IJ SOLN
4.0000 mg | Freq: Once | INTRAMUSCULAR | Status: AC | PRN
  Administered 2011-06-29: 4 mg via INTRAVENOUS

## 2011-06-29 MED ORDER — SCOPOLAMINE 1 MG/3DAYS TD PT72
MEDICATED_PATCH | TRANSDERMAL | Status: DC | PRN
  Administered 2011-06-29: 1 via TRANSDERMAL

## 2011-06-29 MED ORDER — DEXAMETHASONE SODIUM PHOSPHATE 4 MG/ML IJ SOLN
INTRAMUSCULAR | Status: DC | PRN
  Administered 2011-06-29: 4 mg via INTRAVENOUS

## 2011-06-29 MED ORDER — LACTATED RINGERS IV SOLN
INTRAVENOUS | Status: DC | PRN
  Administered 2011-06-29 (×4): via INTRAVENOUS

## 2011-06-29 MED ORDER — HYDROMORPHONE HCL PF 1 MG/ML IJ SOLN
0.2500 mg | INTRAMUSCULAR | Status: DC | PRN
  Administered 2011-06-29: 0.5 mg via INTRAVENOUS
  Administered 2011-06-29: 0.25 mg via INTRAVENOUS
  Administered 2011-06-29: 0.5 mg via INTRAVENOUS
  Administered 2011-06-29: 0.25 mg via INTRAVENOUS
  Administered 2011-06-29: 0.5 mg via INTRAVENOUS

## 2011-06-29 MED ORDER — ARTIFICIAL TEARS OP OINT
TOPICAL_OINTMENT | OPHTHALMIC | Status: DC | PRN
  Administered 2011-06-29: 1 via OPHTHALMIC

## 2011-06-29 MED ORDER — ONDANSETRON HCL 4 MG/2ML IJ SOLN
INTRAMUSCULAR | Status: DC | PRN
  Administered 2011-06-29 (×2): 4 mg via INTRAVENOUS

## 2011-06-29 MED ORDER — BIOTIN 10 MG PO TABS: 1.0000 | ORAL_TABLET | Freq: Every day | ORAL | Status: DC

## 2011-06-29 MED ORDER — ONDANSETRON HCL 4 MG/2ML IJ SOLN: 4.0000 mg | Freq: Four times a day (QID) | INTRAMUSCULAR | Status: DC | PRN

## 2011-06-29 MED ORDER — DROPERIDOL 2.5 MG/ML IJ SOLN
INTRAMUSCULAR | Status: DC | PRN
  Administered 2011-06-29: 0.625 mg via INTRAVENOUS

## 2011-06-29 MED ORDER — HYDROMORPHONE HCL PF 1 MG/ML IJ SOLN
1.0000 mg | INTRAMUSCULAR | Status: DC | PRN
  Administered 2011-06-29 – 2011-06-30 (×3): 1 mg via INTRAVENOUS
  Filled 2011-06-29 (×4): qty 1

## 2011-06-29 MED ORDER — HYDROMORPHONE HCL PF 1 MG/ML IJ SOLN
INTRAMUSCULAR | Status: AC
  Administered 2011-06-29: 0.5 mg via INTRAVENOUS
  Filled 2011-06-29: qty 1

## 2011-06-29 MED ORDER — PANTOPRAZOLE SODIUM 40 MG IV SOLR
40.0000 mg | Freq: Every day | INTRAVENOUS | Status: DC
  Administered 2011-06-29: 40 mg via INTRAVENOUS
  Filled 2011-06-29 (×2): qty 40

## 2011-06-29 MED ORDER — SODIUM CHLORIDE 0.9 % IR SOLN
Status: DC | PRN
  Administered 2011-06-29: 500 mL

## 2011-06-29 MED ORDER — HYDROCODONE-ACETAMINOPHEN 5-325 MG PO TABS
1.0000 | ORAL_TABLET | Freq: Four times a day (QID) | ORAL | Status: DC | PRN
  Administered 2011-06-29 – 2011-06-30 (×2): 1 via ORAL
  Filled 2011-06-29 (×2): qty 1

## 2011-06-29 MED ORDER — METHYLENE BLUE 1 % INJ SOLN
INTRAMUSCULAR | Status: DC | PRN
  Administered 2011-06-29: 08:00:00

## 2011-06-29 MED ORDER — BISACODYL 5 MG PO TBEC: 5.0000 mg | DELAYED_RELEASE_TABLET | Freq: Every day | ORAL | Status: DC | PRN

## 2011-06-29 MED ORDER — DOCUSATE SODIUM 100 MG PO CAPS
100.0000 mg | ORAL_CAPSULE | Freq: Two times a day (BID) | ORAL | Status: DC
  Administered 2011-06-30: 100 mg via ORAL
  Filled 2011-06-29 (×2): qty 1

## 2011-06-29 MED ORDER — FENTANYL CITRATE 0.05 MG/ML IJ SOLN
INTRAMUSCULAR | Status: DC | PRN
  Administered 2011-06-29: 100 ug via INTRAVENOUS
  Administered 2011-06-29: 50 ug via INTRAVENOUS
  Administered 2011-06-29: 25 ug via INTRAVENOUS
  Administered 2011-06-29: 100 ug via INTRAVENOUS

## 2011-06-29 MED ORDER — KCL IN DEXTROSE-NACL 20-5-0.45 MEQ/L-%-% IV SOLN
INTRAVENOUS | Status: DC
  Administered 2011-06-29 (×2): via INTRAVENOUS
  Filled 2011-06-29 (×5): qty 1000

## 2011-06-29 MED ORDER — 0.9 % SODIUM CHLORIDE (POUR BTL) OPTIME
TOPICAL | Status: DC | PRN
  Administered 2011-06-29: 3000 mL

## 2011-06-29 MED ORDER — SODIUM CHLORIDE 0.9 % IR SOLN
Status: DC | PRN
  Administered 2011-06-29: 08:00:00

## 2011-06-29 MED ORDER — CHLORHEXIDINE GLUCONATE 4 % EX LIQD: 1.0000 "application " | Freq: Once | CUTANEOUS | Status: DC

## 2011-06-29 MED ORDER — POLYETHYLENE GLYCOL 3350 17 G PO PACK
17.0000 g | PACK | Freq: Every day | ORAL | Status: DC | PRN
  Filled 2011-06-29: qty 1

## 2011-06-29 MED ORDER — KCL IN DEXTROSE-NACL 20-5-0.45 MEQ/L-%-% IV SOLN
INTRAVENOUS | Status: AC
  Filled 2011-06-29: qty 1000

## 2011-06-29 MED ORDER — MIDAZOLAM HCL 5 MG/5ML IJ SOLN
INTRAMUSCULAR | Status: DC | PRN
  Administered 2011-06-29: 2 mg via INTRAVENOUS

## 2011-06-29 MED ORDER — MORPHINE SULFATE 2 MG/ML IJ SOLN: 0.0500 mg/kg | INTRAMUSCULAR | Status: DC | PRN

## 2011-06-29 MED ORDER — ACETAMINOPHEN 325 MG PO TABS
650.0000 mg | ORAL_TABLET | Freq: Four times a day (QID) | ORAL | Status: DC | PRN
  Administered 2011-06-30: 650 mg via ORAL
  Filled 2011-06-29 (×2): qty 2

## 2011-06-29 MED ORDER — TECHNETIUM TC 99M SULFUR COLLOID FILTERED
1.0000 | Freq: Once | INTRAVENOUS | Status: AC | PRN
  Administered 2011-06-29: 1 via INTRADERMAL

## 2011-06-29 MED ORDER — GLYCOPYRROLATE 0.2 MG/ML IJ SOLN
INTRAMUSCULAR | Status: DC | PRN
  Administered 2011-06-29: .4 mg via INTRAVENOUS

## 2011-06-29 MED ORDER — ACETAMINOPHEN 650 MG RE SUPP: 650.0000 mg | Freq: Four times a day (QID) | RECTAL | Status: DC | PRN

## 2011-06-29 MED ORDER — LIDOCAINE HCL 1 % IJ SOLN
INTRAMUSCULAR | Status: DC | PRN
  Administered 2011-06-29: 80 mg via INTRADERMAL

## 2011-06-29 MED ORDER — VECURONIUM BROMIDE 10 MG IV SOLR
INTRAVENOUS | Status: DC | PRN
  Administered 2011-06-29: 3 mg via INTRAVENOUS

## 2011-06-29 MED ORDER — NEOSTIGMINE METHYLSULFATE 1 MG/ML IJ SOLN
INTRAMUSCULAR | Status: DC | PRN
  Administered 2011-06-29: 3 mg via INTRAVENOUS

## 2011-06-29 MED ORDER — LORATADINE 10 MG PO TABS
10.0000 mg | ORAL_TABLET | Freq: Every day | ORAL | Status: DC
  Administered 2011-06-30: 10 mg via ORAL
  Filled 2011-06-29 (×2): qty 1

## 2011-06-29 MED ORDER — PROPOFOL 10 MG/ML IV EMUL
INTRAVENOUS | Status: DC | PRN
  Administered 2011-06-29: 75 ug/kg/min via INTRAVENOUS

## 2011-06-29 MED ORDER — PROPOFOL 10 MG/ML IV EMUL
INTRAVENOUS | Status: DC | PRN
  Administered 2011-06-29: 200 mg via INTRAVENOUS

## 2011-06-29 MED ORDER — PROMETHAZINE HCL 25 MG/ML IJ SOLN
12.5000 mg | Freq: Four times a day (QID) | INTRAMUSCULAR | Status: DC | PRN
  Administered 2011-06-29: 12.5 mg via INTRAVENOUS
  Filled 2011-06-29: qty 1

## 2011-06-29 MED ORDER — ONDANSETRON HCL 4 MG/2ML IJ SOLN
INTRAMUSCULAR | Status: AC
  Filled 2011-06-29: qty 2

## 2011-06-29 MED ORDER — LIDOCAINE HCL 4 % MT SOLN
OROMUCOSAL | Status: DC | PRN
  Administered 2011-06-29: 4 mL via TOPICAL

## 2011-06-29 MED ORDER — EPHEDRINE SULFATE 50 MG/ML IJ SOLN
INTRAMUSCULAR | Status: DC | PRN
  Administered 2011-06-29: 5 mg via INTRAVENOUS
  Administered 2011-06-29 (×2): 10 mg via INTRAVENOUS

## 2011-06-29 MED ORDER — CIPROFLOXACIN IN D5W 400 MG/200ML IV SOLN
400.0000 mg | Freq: Two times a day (BID) | INTRAVENOUS | Status: DC
  Administered 2011-06-29 – 2011-06-30 (×2): 400 mg via INTRAVENOUS
  Filled 2011-06-29 (×5): qty 200

## 2011-06-29 SURGICAL SUPPLY — 84 items
ADH SKN CLS APL DERMABOND .7 (GAUZE/BANDAGES/DRESSINGS)
ADH SKN CLS LQ APL DERMABOND (GAUZE/BANDAGES/DRESSINGS) ×3
APPLIER CLIP 9.375 MED OPEN (MISCELLANEOUS) ×4
APR CLP MED 9.3 20 MLT OPN (MISCELLANEOUS) ×3
BAG DECANTER FOR FLEXI CONT (MISCELLANEOUS) ×4 IMPLANT
BINDER BREAST LRG (GAUZE/BANDAGES/DRESSINGS) ×4 IMPLANT
BINDER BREAST XLRG (GAUZE/BANDAGES/DRESSINGS) IMPLANT
BIOPATCH RED 1 DISK 7.0 (GAUZE/BANDAGES/DRESSINGS) ×1 IMPLANT
BLADE SURG 15 STRL LF DISP TIS (BLADE) ×3 IMPLANT
BLADE SURG 15 STRL SS (BLADE) ×4
CANISTER SUCTION 2500CC (MISCELLANEOUS) ×7 IMPLANT
CATH FOLEY 2WAY SLVR  5CC 14FR (CATHETERS)
CATH FOLEY 2WAY SLVR 5CC 14FR (CATHETERS) IMPLANT
CHLORAPREP W/TINT 26ML (MISCELLANEOUS) ×4 IMPLANT
CLIP APPLIE 9.375 MED OPEN (MISCELLANEOUS) IMPLANT
CLOTH BEACON ORANGE TIMEOUT ST (SAFETY) ×7 IMPLANT
CONT SPEC 4OZ CLIKSEAL STRL BL (MISCELLANEOUS) ×5 IMPLANT
COVER PROBE W GEL 5X96 (DRAPES) ×4 IMPLANT
COVER SURGICAL LIGHT HANDLE (MISCELLANEOUS) ×8 IMPLANT
DERMABOND ADHESIVE PROPEN (GAUZE/BANDAGES/DRESSINGS) ×1
DERMABOND ADVANCED (GAUZE/BANDAGES/DRESSINGS)
DERMABOND ADVANCED .7 DNX12 (GAUZE/BANDAGES/DRESSINGS) ×3 IMPLANT
DERMABOND ADVANCED .7 DNX6 (GAUZE/BANDAGES/DRESSINGS) IMPLANT
DRAIN CHANNEL 19F RND (DRAIN) ×11 IMPLANT
DRAPE LAPAROSCOPIC ABDOMINAL (DRAPES) ×4 IMPLANT
DRAPE ORTHO SPLIT 77X108 STRL (DRAPES) ×8
DRAPE PROXIMA HALF (DRAPES) IMPLANT
DRAPE SURG ORHT 6 SPLT 77X108 (DRAPES) ×6 IMPLANT
DRAPE WARM FLUID 44X44 (DRAPE) ×4 IMPLANT
DRSG PAD ABDOMINAL 8X10 ST (GAUZE/BANDAGES/DRESSINGS) ×6 IMPLANT
ELECT BLADE 4.0 EZ CLEAN MEGAD (MISCELLANEOUS) ×4
ELECT CAUTERY BLADE 6.4 (BLADE) ×4 IMPLANT
ELECT REM PT RETURN 9FT ADLT (ELECTROSURGICAL) ×4
ELECTRODE BLDE 4.0 EZ CLN MEGD (MISCELLANEOUS) ×3 IMPLANT
ELECTRODE REM PT RTRN 9FT ADLT (ELECTROSURGICAL) ×6 IMPLANT
EVACUATOR SILICONE 100CC (DRAIN) ×11 IMPLANT
GLOVE BIO SURGEON STRL SZ 6.5 (GLOVE) ×5 IMPLANT
GLOVE BIO SURGEON STRL SZ7.5 (GLOVE) ×2 IMPLANT
GLOVE BIOGEL PI IND STRL 7.0 (GLOVE) IMPLANT
GLOVE BIOGEL PI IND STRL 7.5 (GLOVE) IMPLANT
GLOVE BIOGEL PI INDICATOR 7.0 (GLOVE) ×2
GLOVE BIOGEL PI INDICATOR 7.5 (GLOVE) ×1
GLOVE EUDERMIC 7 POWDERFREE (GLOVE) ×4 IMPLANT
GLOVE SURG SS PI 6.5 STRL IVOR (GLOVE) ×1 IMPLANT
GOWN PREVENTION PLUS XLARGE (GOWN DISPOSABLE) ×4 IMPLANT
GOWN STRL NON-REIN LRG LVL3 (GOWN DISPOSABLE) ×14 IMPLANT
GRAFT FLEX HD 4X16 THICK (Tissue Mesh) ×2 IMPLANT
KIT BASIN OR (CUSTOM PROCEDURE TRAY) ×8 IMPLANT
KIT ROOM TURNOVER OR (KITS) ×8 IMPLANT
NDL 18GX1X1/2 (RX/OR ONLY) (NEEDLE) ×3 IMPLANT
NDL HYPO 25GX1X1/2 BEV (NEEDLE) ×6 IMPLANT
NDL HYPO 25X1 1.5 SAFETY (NEEDLE) ×3 IMPLANT
NEEDLE 18GX1X1/2 (RX/OR ONLY) (NEEDLE) ×4 IMPLANT
NEEDLE HYPO 25GX1X1/2 BEV (NEEDLE) ×8 IMPLANT
NEEDLE HYPO 25X1 1.5 SAFETY (NEEDLE) ×4 IMPLANT
NS IRRIG 1000ML POUR BTL (IV SOLUTION) ×9 IMPLANT
PACK GENERAL/GYN (CUSTOM PROCEDURE TRAY) ×8 IMPLANT
PAD ARMBOARD 7.5X6 YLW CONV (MISCELLANEOUS) ×16 IMPLANT
PEN SKIN MARKING BROAD (MISCELLANEOUS) ×4 IMPLANT
SPECIMEN JAR LARGE (MISCELLANEOUS) ×4 IMPLANT
SPECIMEN JAR X LARGE (MISCELLANEOUS) ×4 IMPLANT
SPONGE GAUZE 4X4 12PLY (GAUZE/BANDAGES/DRESSINGS) ×3 IMPLANT
SPONGE LAP 18X18 X RAY DECT (DISPOSABLE) ×1 IMPLANT
STAPLER VISISTAT 35W (STAPLE) ×7 IMPLANT
STRIP CLOSURE SKIN 1/2X4 (GAUZE/BANDAGES/DRESSINGS) ×6 IMPLANT
SUT ETHILON 3 0 FSL (SUTURE) ×3 IMPLANT
SUT MNCRL AB 3-0 PS2 18 (SUTURE) ×3 IMPLANT
SUT MNCRL AB 4-0 PS2 18 (SUTURE) ×10 IMPLANT
SUT MON AB 5-0 PS2 18 (SUTURE) ×16 IMPLANT
SUT PDS AB 2-0 CT1 27 (SUTURE) ×11 IMPLANT
SUT SILK 0 FSL (SUTURE) ×8 IMPLANT
SUT SILK 2 0 FS (SUTURE) ×3 IMPLANT
SUT SILK 2 0 SH (SUTURE) ×2 IMPLANT
SUT SILK 3 0 SH 30 (SUTURE) ×2 IMPLANT
SUT VIC AB 2-0 SH 18 (SUTURE) IMPLANT
SUT VIC AB 3-0 SH 18 (SUTURE) ×4 IMPLANT
SUT VIC AB 3-0 SH 27 (SUTURE) ×8
SUT VIC AB 3-0 SH 27X BRD (SUTURE) ×3 IMPLANT
SUT VICRYL 4-0 PS2 18IN ABS (SUTURE) ×1 IMPLANT
SYR CONTROL 10ML LL (SYRINGE) ×10 IMPLANT
TISSUE EXPANDER 350CC (Miscellaneous) ×2 IMPLANT
TOWEL OR 17X24 6PK STRL BLUE (TOWEL DISPOSABLE) ×8 IMPLANT
TOWEL OR 17X26 10 PK STRL BLUE (TOWEL DISPOSABLE) ×8 IMPLANT
WATER STERILE IRR 1000ML POUR (IV SOLUTION) IMPLANT

## 2011-06-29 NOTE — Discharge Instructions (Signed)
Drain care

## 2011-06-29 NOTE — Op Note (Signed)
Patient Name:           [name]   Date of Surgery:        06/29/2011  Pre op Diagnosis:      Invasive ductal carcinoma right breast, lower inner quadrant, clinical stage T1c., N0, receptor positive, HER-2/neu-positive,BRCA negative.   Status post neoadjuvant chemotherapy.  Post op Diagnosis:    same  Procedure:                 Injected blue dye right breast, right total mastectomy, right axillary sentinel node mapping and biopsy, left total mastectomy  Surgeon:                     Angelia Mould. Derrell Lolling, M.D., FACS  Assistant:                      Ovidio Kin, M.D., FACS  Operative Indications:   This is a 41 year old woman who was diagnosed with cancer of the right breast on 12/22/2000 well. There was a 2 cm tumor in the right breast, lower inner quadrant which was biopsied and showed invasive ductal carcinoma, receptor-positive and HER-2/neu-positive. A biopsy of a right axillary lymph node was negative. Genetic testing is also negative for BRCA1 and BRCA2. She was uncertain as to the extent of surgery and so a Port-A-Cath was placed and she was given neoadjuvant chemotherapy under the guidance of Dr. Jeanette Caprice.   Post-treatment MRI shows that the tumor has responded and has gone from about 2 cm in size to about 1.2 cm. There was no abnormality in the left breast and there is no adenopathy. She has elected to have bilateral skin sparing mastectomies with immediate reconstruction with tissue expanders. She is brought to the operating room electively.  Operative Findings:       The breast tissue felt normal throughout on both sides. I found 3 sentinel nodes in the right axilla all of them strongly positive with radioactivity and blue dye. They did not feel pathologically enlarged.  Procedure in Detail:          The patient was brought to the holding area at the hospital and the right breast was injected with radionuclide by the nuclear medicine technician. The patient was taken to the  operating room where general endotracheal anesthesia was induced. Intravenous antibiotics were given. A surgical time out was performed. Following alcohol prep the right breast was injected with 5 cc of blue dye. This was 2 cc of methylene blue mixed with 3 cc of saline. The right breast was massaged for 5 minutes.  The neck and chest and upper abdomen, axilla and chest wall were prepped and draped in the usual sterile fashion. Using a marking pen I marked the midline and the inframammary creases.  I marked a bilateral elliptical skin sparing incision was bringing the medial  aspect of the incisions low and the lateral aspect of the incisions higher.  I performed the left mastectomy first. An elliptical incision was made. Skin flaps were raised superiorly to the infraclavicular area, medially to the parasternal area, inferiorly to the anterior rectus sheath and laterally to just above the latissimus dorsi muscle. Care was taken to avoid entering the plane of the Port-A-Cath.  The breast was then dissected off of the pectoralis major and minor muscles. The lateral skin margin was marked with a silk suture. The wound was irrigated with saline. Hemostasis was excellent. The skin flaps looked good. The  wound was packed with saline gauze.  We then turned our attention to the right breast. A mirror-image elliptical incision was made and skin flaps were raised superiorly, medially, inferiorly, and laterally in a similar fashion. We took the dissection up into the axilla and using the Neoprobe we isolated 3 sentinel lymph nodes.  All 3 had strong radioactivity and intense blue dye. After these were removed there was almost no more radioactivity and so we concluded the sentinel node dissection. The right breast was dissected away from the tissues with electrocautery. The lateral skin margin was marked with a silk suture and the breast specimen was sent separately to the pathology lab. The wound was irrigated with  saline. Hemostasis was excellent. It was packed with gauze  At this point the patient had  done well and there had been no complications. EBL was 75 cc total. Complications none. Counts were correct.  At this point Dr. Kelly Splinter scrubbed in and assumed  control of the operative procedure for the bilateral reconstruction. She will dictate her summary  of her procedure separately.     Angelia Mould. Derrell Lolling, M.D., FACS General and Minimally Invasive Surgery Breast and Colorectal Surgery  06/29/2011 9:54 AM

## 2011-06-29 NOTE — Anesthesia Postprocedure Evaluation (Signed)
  Anesthesia Post-op Note  Patient: [name]  Procedure(s) Performed: Procedure(s) (LRB): MASTECTOMY WITH SENTINEL LYMPH NODE BIOPSY (Right) TOTAL MASTECTOMY (Left) BREAST RECONSTRUCTION (Bilateral)  Patient Location: PACU  Anesthesia Type: General  Level of Consciousness: awake  Airway and Oxygen Therapy: Patient Spontanous Breathing  Post-op Pain: mild  Post-op Assessment: Post-op Vital signs reviewed  Post-op Vital Signs: Reviewed and stable  Complications: No apparent anesthesia complications

## 2011-06-29 NOTE — Progress Notes (Signed)
Pt still experiencing nausea, not time to receive another dose of Zofran.  Notified Dr. Kelly Splinter, who ordered Phenergan prn for N/V.

## 2011-06-29 NOTE — Progress Notes (Signed)
UR of chart complete.  

## 2011-06-29 NOTE — Preoperative (Signed)
Beta Blockers   Reason not to administer Beta Blockers:Not Applicable 

## 2011-06-29 NOTE — OR Nursing (Signed)
Dr. Kelly Splinter started 2nd procedure at [phone].

## 2011-06-29 NOTE — Anesthesia Preprocedure Evaluation (Addendum)
Anesthesia Evaluation  Patient identified by MRN, date of birth, ID band Patient awake    Reviewed: Allergy & Precautions, H&P , NPO status , Patient's Chart, lab work & pertinent test results  History of Anesthesia Complications (+) PONV  Airway Mallampati: I TM Distance: >3 FB Neck ROM: Full    Dental  (+) Teeth Intact and Dental Advisory Given   Pulmonary asthma ,  breath sounds clear to auscultation        Cardiovascular negative cardio ROS  Rhythm:Regular Rate:Normal  Echo shows EF 55-60% with MR.   Neuro/Psych  Headaches, negative psych ROS   GI/Hepatic negative GI ROS, Neg liver ROS,   Endo/Other  Breast CA  Renal/GU negative Renal ROS  negative genitourinary   Musculoskeletal negative musculoskeletal ROS (+)   Abdominal   Peds  Hematology negative hematology ROS (+)   Anesthesia Other Findings   Reproductive/Obstetrics negative OB ROS                        Anesthesia Physical Anesthesia Plan  ASA: II  Anesthesia Plan: General   Post-op Pain Management:    Induction: Intravenous  Airway Management Planned: Oral ETT  Additional Equipment:   Intra-op Plan:   Post-operative Plan: Extubation in OR  Informed Consent: I have reviewed the patients History and Physical, chart, labs and discussed the procedure including the risks, benefits and alternatives for the proposed anesthesia with the patient or authorized representative who has indicated his/her understanding and acceptance.     Plan Discussed with: CRNA  Anesthesia Plan Comments:         Anesthesia Quick Evaluation

## 2011-06-29 NOTE — Interval H&P Note (Signed)
History and Physical Interval Note:  06/29/2011 7:32 AM  [name]  has presented today for surgery, with the diagnosis of right breast cancer  The goals of treatment and the various methods of treatment have been discussed with the patient and family. After consideration of risks, benefits and other options for treatment, the patient has consented to  Procedure(s) (LRB): MASTECTOMY WITH SENTINEL LYMPH NODE BIOPSY (Right) TOTAL MASTECTOMY (Left) BREAST RECONSTRUCTION (Bilateral) as a surgical intervention .  The patients' history has been reviewed, patient examined, no change in status, stable for surgery.  I have reviewed the patients' chart and labs.  Questions were answered to the patient's satisfaction.     [name]

## 2011-06-29 NOTE — Brief Op Note (Signed)
06/29/2011  11:24 AM  PATIENT:  [name]  41 y.o. female  PRE-OPERATIVE DIAGNOSIS:  right breast cancer  POST-OPERATIVE DIAGNOSIS:  right breast cancer  PROCEDURE:  Procedure(s) (LRB):  MASTECTOMY WITH SENTINEL LYMPH NODE BIOPSY (Right) TOTAL MASTECTOMY (Left) IMMEDIATE BREAST RECONSTRUCTION (Bilateral) WITH EXPANDER AND FLEXHD PLACEMENT  SURGEON:  Surgeon(s) and Role: Panel 1:    * Kandis Cocking, MD - Assisting    * [name], MD - Primary  Panel 2:    * Kyel Purk Sanger, DO - Primary  PHYSICIAN ASSISTANT:   ASSISTANTS: none   ANESTHESIA:   general  EBL:  Total I/O In: 3000 [I.V.:3000] Out: 220 [Urine:70; Blood:150]  BLOOD ADMINISTERED:none  DRAINS: (2) Jackson-Pratt drain(s) with closed bulb suction in the breast pocket on each side - total of 2 drains   LOCAL MEDICATIONS USED:  NONE  SPECIMEN:  No Specimen  DISPOSITION OF SPECIMEN:  N/A  COUNTS:  YES  TOURNIQUET:  * No tourniquets in log *  DICTATION: dictated  PLAN OF CARE: Admit for overnight observation  PATIENT DISPOSITION:  PACU - hemodynamically stable.   Delay start of Pharmacological VTE agent (>24hrs) due to surgical blood loss or risk of bleeding: no

## 2011-06-29 NOTE — Progress Notes (Signed)
0700  Thursday...Marland KitchenMarland Kitchenthe patient is "allergic" (rash)  To penicillins... this was brought to Dr Jacinto Halim attention...he replied "he will deal with it over there (holding area).Marland KitchenMarland KitchenAncef & Vanco sent per orders

## 2011-06-29 NOTE — Op Note (Signed)
NAMEAILIE, GAGE              ACCOUNT NO.:  [id number]  MEDICAL RECORD NO.:  [id number]  LOCATION:  5152                         FACILITY:  [facility]  PHYSICIAN:  Wayland Denis, DO      DATE OF BIRTH:  Dec 18, 1970  DATE OF PROCEDURE:  06/29/2011 DATE OF DISCHARGE:                              OPERATIVE REPORT   PREOPERATIVE DIAGNOSIS:  Right breast cancer.  POSTOPERATIVE DIAGNOSIS:  Right breast cancer.  PROCEDURE:  Immediate bilateral breast reconstruction with expander and flex HD placement(4 x 16 cm)  ATTENDING:  Wayland Denis, DO  ANESTHESIA:  General.  INDICATION FOR PROCEDURE:  The patient is a 41 year old female who was diagnosed with right breast cancer and opted for bilateral mastectomies with immediate reconstruction.  She was seen in holding, risks and complications were discussed and reviewed in detail.  The patient was marked, consent was confirmed.  The risks included bleeding, pain, scar, risk of anesthesia, capsular contracture, and flap loss.  DESCRIPTION OF PROCEDURE:  The patient was taken to the operating room by General Surgery and underwent bilateral mastectomies with right sentinel lymph node biopsy.  Once their portion of the case was finished, the patient was rendered to the plastic and reconstructive surgery team, a time-out was called.  All information was confirmed to be correct.  She was re-draped.  We began on the left side.  The pocket was irrigated with antibiotic solution, normal saline.  Hemostasis was achieved with electrocautery.  The pectoralis major muscle was lifted off the chest wall using the electrocautery.  Once that was complete, the flex HD 4 x 16 was placed in the pocket after it was prepared according to the manufacture's guidelines.  Fenestrations were placed to help with facilitating drainage, 2-0 PDS was utilized with figure-of- eight in a running stitch to secure the flex HD to the inferior lateral border of the pectoralis  major muscle and the chest wall.  Once that was secured, the mentor medium height 350 mL expander was prepared according to the manufacture's guidelines.  The expander was soaked in antibiotic solution, placed underneath the pectoralis major muscle  after the air was evacuated, 150 mL of injectable normal saline was inflated into the expander.  The lateral portion of the flex HD was tacked to the chest wall, a 15 blade was used to make a stab incision and the drain was placed and secured to the skin with 3-0 silk.  The deep layers were closed with 3-0 Vicryl, followed by 4-0 Vicryl, and the skin was reapproximated with 5-0 Monocryl. Dermabond was applied.  The same exact procedure was done on the right side, and a total of 150 mL of injectable saline was placed in 350 mL mentor medium height expander.  The patient was awoken and taken to recovery room in stable condition.     Wayland Denis, DO     CS/MEDQ  D:  06/29/2011  T:  06/29/2011  Job:  [id number]

## 2011-06-29 NOTE — Anesthesia Procedure Notes (Signed)
Procedure Name: Intubation Date/Time: 06/29/2011 7:58 AM Performed by: Leona Singleton A Pre-anesthesia Checklist: Patient identified Patient Re-evaluated:Patient Re-evaluated prior to inductionOxygen Delivery Method: Circle system utilized Preoxygenation: Pre-oxygenation with 100% oxygen Intubation Type: IV induction Ventilation: Mask ventilation without difficulty Laryngoscope Size: Miller and 2 Grade View: Grade I Tube type: Oral Tube size: 7.0 mm Number of attempts: 1 Airway Equipment and Method: Stylet and LTA kit utilized Placement Confirmation: ETT inserted through vocal cords under direct vision,  positive ETCO2 and breath sounds checked- equal and bilateral Secured at: 21 cm Tube secured with: Tape Dental Injury: Teeth and Oropharynx as per pre-operative assessment

## 2011-06-29 NOTE — Transfer of Care (Signed)
Immediate Anesthesia Transfer of Care Note  Patient: [name]  Procedure(s) Performed: Procedure(s) (LRB): MASTECTOMY WITH SENTINEL LYMPH NODE BIOPSY (Right) TOTAL MASTECTOMY (Left) BREAST RECONSTRUCTION (Bilateral)  Patient Location: PACU  Anesthesia Type: General  Level of Consciousness: awake, alert , oriented and patient cooperative  Airway & Oxygen Therapy: Patient Spontanous Breathing and Patient connected to nasal cannula oxygen  Post-op Assessment: Report given to PACU RN and Post -op Vital signs reviewed and stable  Post vital signs: Reviewed and stable  Complications: No apparent anesthesia complications

## 2011-06-30 ENCOUNTER — Encounter (HOSPITAL_COMMUNITY): Payer: Self-pay | Admitting: General Surgery

## 2011-06-30 MED ORDER — OXYCODONE HCL 10 MG PO TB12: 10.0000 mg | ORAL_TABLET | Freq: Two times a day (BID) | ORAL | Status: AC

## 2011-06-30 MED ORDER — OXYCODONE HCL 10 MG PO TB12
10.0000 mg | ORAL_TABLET | Freq: Two times a day (BID) | ORAL | Status: DC
  Administered 2011-06-30: 10 mg via ORAL
  Filled 2011-06-30: qty 1

## 2011-06-30 MED ORDER — OXYCODONE HCL 5 MG PO TABS
10.0000 mg | ORAL_TABLET | ORAL | Status: DC | PRN
  Administered 2011-06-30 (×2): 10 mg via ORAL
  Filled 2011-06-30 (×2): qty 2

## 2011-06-30 MED ORDER — OXYCODONE HCL 10 MG PO TABS: 10.0000 mg | ORAL_TABLET | ORAL | Status: AC | PRN

## 2011-06-30 MED ORDER — PANTOPRAZOLE SODIUM 40 MG PO TBEC: 40.0000 mg | DELAYED_RELEASE_TABLET | Freq: Every day | ORAL | Status: DC

## 2011-06-30 NOTE — Progress Notes (Signed)
General Surgery Note  LOS: 1 day  POD# 1  Assessment/Plan: 1. Right MASTECTOMY WITH Right axillary SENTINEL LYMPH NODE BIOPSY, Left TOTAL MASTECTOMY - 06/29/2011 - H. Derrell Lolling Doing well.  Discharge per Dr. Kelly Splinter.  2. Bilateral BREAST RECONSTRUCTION - C. Sanger  3.  Post neo adjuvant chemotx  Subjective:  Doing well.  Dr. Kelly Splinter in the room at the same time.  Her sister in law is in the room also. Answered questions about pathology. Objective:   Filed Vitals:   06/30/11 0613  BP: 99/58  Pulse: 76  Temp: 98.7 F (37.1 C)  Resp: 18     Intake/Output from previous day:  03/21 0701 - 03/22 0700 In: 5427.8 [P.O.:100; I.V.:5327.8] Out: 3755 [Urine:3370; Drains:235; Blood:150]  Intake/Output this shift:      Physical Exam:   General: WN WF who is alert and oriented.    HEENT: Normal. Pupils equal. Good dentition. .   Lungs: Clear   Breast Wound: Clean.  In binder.  Has 2 drains.   Neurologic:  Grossly intact to motor and sensory function.   Psychiatric: Has normal mood and affect. Behavior is normal   Lab Results:   No results found for this basename: WBC:2,HGB:2,HCT:2,PLT:2 in the last 72 hours  BMET  No results found for this basename: NA:2,K:2,CL:2,CO2:2,GLUCOSE:2,BUN:2,CREATININE:2,CALCIUM:2 in the last 72 hours  PT/INR  No results found for this basename: LABPROT:2,INR:2 in the last 72 hours  ABG  No results found for this basename: PHART:2,PCO2:2,PO2:2,HCO3:2 in the last 72 hours   Studies/Results:  Nm Sentinel Node Inj-no Rpt (breast)  06/29/2011  CLINICAL DATA: right breast cancer   Sulfur colloid was injected intradermally by the nuclear medicine  technologist for breast cancer sentinel node localization.       Anti-infectives:   Anti-infectives     Start     Dose/Rate Route Frequency Ordered Stop   06/29/11 1700   ciprofloxacin (CIPRO) IVPB 400 mg        400 mg 200 mL/hr over 60 Minutes Intravenous Every 12 hours 06/29/11 1559     06/29/11 0813    polymyxin B 500,000 Units, bacitracin 50,000 Units in sodium chloride irrigation 0.9 % 500 mL irrigation  Status:  Discontinued          As needed 06/29/11 0813 06/29/11 1128   06/28/11 1430   ceFAZolin (ANCEF) IVPB 1 g/50 mL premix  Status:  Discontinued        1 g 100 mL/hr over 30 Minutes Intravenous 60 min pre-op 06/28/11 1430 06/29/11 1537   06/28/11 1430   vancomycin (VANCOCIN) IVPB 1000 mg/200 mL premix  Status:  Discontinued        1,000 mg 200 mL/hr over 60 Minutes Intravenous 120 min pre-op 06/28/11 1430 06/29/11 1537          Ovidio Kin, MD, FACS Pager: [phone],   [facility] [place] [facility] Office: [phone] 06/30/2011

## 2011-06-30 NOTE — Discharge Summary (Signed)
Physician Discharge Summary  Patient ID: [name] MRN: [id number] DOB/AGE: 10/03/1970 41 y.o.  Admit date: 06/29/2011 Discharge date: 06/30/2011  Admission Diagnoses: Breast Cancer  Discharge Diagnoses:  Active Problems:  Invasive ductal carcinoma of breast, stage 2   Discharged Condition: good  Hospital Course: The patient was taken to the OR and underwent bilateral mastectomies with immediate reconstruction utilizing expanders and FlexHD.  She did well throughout the procedure and was placed on the surgery unit.  She did well with increase in strength.  She was tolerating food, voiding and ambulating without difficulty.  Her pain was well controlled on oral medications. Her drain output was minimal.  Consults: None  Significant Diagnostic Studies: none  Treatments: surgery: bilateral mastectomies with immediate reconstruction  Discharge Exam: Blood pressure 101/68, pulse 98, temperature 98.3 F (36.8 C), temperature source Oral, resp. rate 16, height 5\' 3"  (1.6 m), weight 61.326 kg (135 lb 3.2 oz), SpO2 100.00%. General appearance: alert, cooperative and no distress Incision/Wound:  Disposition: 01-Home or Self Care  Discharge Orders    Future Appointments: Provider: Department: Dept Phone: Center:   07/20/2011 8:15 AM Radene Gunning [facility] [phone] None   07/20/2011 8:45 AM Amy Allegra Grana, PA [facility] [phone] None   07/20/2011 9:30 AM [facility] [phone] None   07/21/2011 4:30 PM [name], MD [facility] [phone] None   08/10/2011 1:00 PM Dava Najjar Idelle Jo [facility] [phone] None   08/10/2011 1:30 PM [facility] [phone] None   08/31/2011 1:00 PM Sherrie Mustache [facility] [phone] None   08/31/2011 1:30 PM Radene Gunning [facility] [phone] None   09/21/2011 1:00 PM Radene Gunning [facility] [phone] None   09/21/2011 1:30 PM [facility] [phone] None    10/13/2011 1:00 PM Radene Gunning [facility] [phone] None   10/13/2011 1:30 PM [facility] [phone] None     Medication List  As of 06/30/2011  3:06 PM   STOP taking these medications         guaiFENesin 600 MG 12 hr tablet      ibuprofen 800 MG tablet      lidocaine-prilocaine cream      polyethylene glycol packet         TAKE these medications         Biotin 10 MG Tabs   Take 1 tablet by mouth daily.      docusate sodium 100 MG capsule   Commonly known as: COLACE   Take 100 mg by mouth 2 (two) times daily as needed. Constipation      loratadine 10 MG tablet   Commonly known as: CLARITIN   Take 10 mg by mouth daily.      LORazepam 1 MG tablet   Commonly known as: ATIVAN   TAKE ONE-HALF TABLET BY MOUTH TWICE DAILY AS NEEDED FOR ANXIETY      oxyCODONE 10 MG 12 hr tablet   Commonly known as: OXYCONTIN   Take 1 tablet (10 mg total) by mouth every 12 (twelve) hours.      Oxycodone HCl 10 MG Tabs   Take 1 tablet (10 mg total) by mouth every 4 (four) hours as needed.      prenatal vitamin w/FE, FA 27-1 MG Tabs   Take 1 tablet by mouth Daily.      tobramycin-dexamethasone ophthalmic solution   Commonly known as: TOBRADEX   Place 1 drop into both eyes  Twice daily.           Follow-up Information    Follow up with [facility], DO in 1 week.   Contact information:   [address] [place] [address] [phone]       Follow up with [name], MD in 2 weeks.   Contact information:   [employer] [place] [address] [place] [address] [phone]          Signed: Wayland Denis 06/30/2011, 3:06 PM

## 2011-06-30 NOTE — Progress Notes (Signed)
1 Day Post-Op  Subjective: The patient is doing very well and tolerating liquids without difficulty.  Pain is well controlled  Objective: Vital signs in last 24 hours: Temp:  [97.2 F (36.2 C)-98.7 F (37.1 C)] 98.7 F (37.1 C) (03/22 1610) Pulse Rate:  [63-90] 76  (03/22 0613) Resp:  [9-18] 18  (03/22 0613) BP: (91-102)/(52-74) 99/58 mmHg (03/22 0613) SpO2:  [100 %] 100 % (03/22 0613) Weight:  [61.326 kg (135 lb 3.2 oz)] 61.326 kg (135 lb 3.2 oz) (03/21 1548) Last BM Date: 06/28/11  Intake/Output from previous day: 03/21 0701 - 03/22 0700 In: 5427.8 [P.O.:100; I.V.:5327.8] Out: 3755 [Urine:3370; Drains:235; Blood:150] Intake/Output this shift:    General appearance: alert, cooperative and no distress Incision/Wound: flaps with good color and capillary refill  Lab Results:  No results found for this basename: WBC:2,HGB:2,HCT:2,PLT:2 in the last 72 hours BMET No results found for this basename: NA:2,K:2,CL:2,CO2:2,GLUCOSE:2,BUN:2,CREATININE:2,CALCIUM:2 in the last 72 hours PT/INR No results found for this basename: LABPROT:2,INR:2 in the last 72 hours ABG No results found for this basename: PHART:2,PCO2:2,PO2:2,HCO3:2 in the last 72 hours  Studies/Results: Nm Sentinel Node Inj-no Rpt (breast)  06/29/2011  CLINICAL DATA: right breast cancer   Sulfur colloid was injected intradermally by the nuclear medicine  technologist for breast cancer sentinel node localization.      Anti-infectives: Anti-infectives     Start     Dose/Rate Route Frequency Ordered Stop   06/29/11 1700   ciprofloxacin (CIPRO) IVPB 400 mg        400 mg 200 mL/hr over 60 Minutes Intravenous Every 12 hours 06/29/11 1559     06/29/11 0813   polymyxin B 500,000 Units, bacitracin 50,000 Units in sodium chloride irrigation 0.9 % 500 mL irrigation  Status:  Discontinued          As needed 06/29/11 0813 06/29/11 1128   06/28/11 1430   ceFAZolin (ANCEF) IVPB 1 g/50 mL premix  Status:  Discontinued        1  g 100 mL/hr over 30 Minutes Intravenous 60 min pre-op 06/28/11 1430 06/29/11 1537   06/28/11 1430   vancomycin (VANCOCIN) IVPB 1000 mg/200 mL premix  Status:  Discontinued        1,000 mg 200 mL/hr over 60 Minutes Intravenous 120 min pre-op 06/28/11 1430 06/29/11 1537          Assessment/Plan: s/p Procedure(s) (LRB): MASTECTOMY WITH SENTINEL LYMPH NODE BIOPSY (Right) TOTAL MASTECTOMY (Left) BREAST RECONSTRUCTION (Bilateral) Discharge today or tomorrow.  LOS: 1 day    [facility] 06/30/2011

## 2011-06-30 NOTE — Discharge Summary (Signed)
Discharged per w/c to private car home with all personal belongings, acomp. By husband.  Both able to demo. JP care. Pt has 2 rx and copy of home instructions.

## 2011-07-03 ENCOUNTER — Telehealth (INDEPENDENT_AMBULATORY_CARE_PROVIDER_SITE_OTHER): Payer: Self-pay | Admitting: General Surgery

## 2011-07-03 NOTE — Telephone Encounter (Signed)
I called Ms. Morga and discussed her pathology report with her. Margins are negative. Sentinel nodes are negative. Left prophylactic mastectomy reveals no disease. Pathologic stage wide ypT1c., yp N0, receptor positive, HER-2 positive, Ki-67 36%.  She seems to understand this information. She is scheduled to see Dr. Kelly Splinter tomorrow and me on April 12.  Angelia Mould. Derrell Lolling, M.D., [facility] General and Minimally invasive Surgery Breast and Colorectal Surgery Office:   [phone] Pager:   [phone]

## 2011-07-12 ENCOUNTER — Telehealth (INDEPENDENT_AMBULATORY_CARE_PROVIDER_SITE_OTHER): Payer: Self-pay | Admitting: General Surgery

## 2011-07-12 ENCOUNTER — Telehealth: Payer: Self-pay | Admitting: Oncology

## 2011-07-12 ENCOUNTER — Other Ambulatory Visit: Payer: Self-pay | Admitting: *Deleted

## 2011-07-12 NOTE — Telephone Encounter (Signed)
S/w the pt and she is aware of her r/s appts time on 07/20/2011@12 :45pm

## 2011-07-13 ENCOUNTER — Telehealth (INDEPENDENT_AMBULATORY_CARE_PROVIDER_SITE_OTHER): Payer: Self-pay

## 2011-07-13 ENCOUNTER — Ambulatory Visit: Payer: BC Managed Care – PPO

## 2011-07-13 NOTE — Telephone Encounter (Signed)
Pt advised margins negative. Pt to keep po appt.

## 2011-07-13 NOTE — Telephone Encounter (Signed)
LMOM for pt to call for answer to question she left.

## 2011-07-20 ENCOUNTER — Other Ambulatory Visit (HOSPITAL_BASED_OUTPATIENT_CLINIC_OR_DEPARTMENT_OTHER): Payer: BC Managed Care – PPO

## 2011-07-20 ENCOUNTER — Other Ambulatory Visit: Payer: BC Managed Care – PPO | Admitting: Lab

## 2011-07-20 ENCOUNTER — Ambulatory Visit: Payer: BC Managed Care – PPO | Admitting: Physician Assistant

## 2011-07-20 ENCOUNTER — Ambulatory Visit: Payer: BC Managed Care – PPO

## 2011-07-20 ENCOUNTER — Other Ambulatory Visit: Payer: Self-pay | Admitting: *Deleted

## 2011-07-20 ENCOUNTER — Ambulatory Visit (HOSPITAL_BASED_OUTPATIENT_CLINIC_OR_DEPARTMENT_OTHER): Payer: BC Managed Care – PPO | Admitting: Oncology

## 2011-07-20 ENCOUNTER — Telehealth: Payer: Self-pay | Admitting: *Deleted

## 2011-07-20 ENCOUNTER — Ambulatory Visit (HOSPITAL_BASED_OUTPATIENT_CLINIC_OR_DEPARTMENT_OTHER): Payer: BC Managed Care – PPO

## 2011-07-20 VITALS — BP 97/67 | HR 89 | Temp 98.6°F | Ht 63.0 in | Wt 129.3 lb

## 2011-07-20 DIAGNOSIS — M2560 Stiffness of unspecified joint, not elsewhere classified: Secondary | ICD-10-CM

## 2011-07-20 DIAGNOSIS — C50919 Malignant neoplasm of unspecified site of unspecified female breast: Secondary | ICD-10-CM

## 2011-07-20 DIAGNOSIS — N63 Unspecified lump in unspecified breast: Secondary | ICD-10-CM

## 2011-07-20 DIAGNOSIS — H538 Other visual disturbances: Secondary | ICD-10-CM

## 2011-07-20 DIAGNOSIS — Z170 Estrogen receptor positive status [ER+]: Secondary | ICD-10-CM

## 2011-07-20 DIAGNOSIS — C50319 Malignant neoplasm of lower-inner quadrant of unspecified female breast: Secondary | ICD-10-CM

## 2011-07-20 DIAGNOSIS — Z5112 Encounter for antineoplastic immunotherapy: Secondary | ICD-10-CM

## 2011-07-20 LAB — CBC WITH DIFFERENTIAL/PLATELET
BASO%: 0.5 % (ref 0.0–2.0)
Basophils Absolute: 0 10*3/uL (ref 0.0–0.1)
EOS%: 4.7 % (ref 0.0–7.0)
Eosinophils Absolute: 0.2 10*3/uL (ref 0.0–0.5)
HCT: 35.5 % (ref 34.8–46.6)
HGB: 12.2 g/dL (ref 11.6–15.9)
LYMPH%: 37.5 % (ref 14.0–49.7)
MCH: 34.4 pg — ABNORMAL HIGH (ref 25.1–34.0)
MCHC: 34.5 g/dL (ref 31.5–36.0)
MCV: 99.8 fL (ref 79.5–101.0)
MONO#: 0.3 10*3/uL (ref 0.1–0.9)
MONO%: 7.3 % (ref 0.0–14.0)
NEUT#: 2.2 10*3/uL (ref 1.5–6.5)
NEUT%: 50 % (ref 38.4–76.8)
Platelets: 239 10*3/uL (ref 145–400)
RBC: 3.56 10*6/uL — ABNORMAL LOW (ref 3.70–5.45)
RDW: 12.5 % (ref 11.2–14.5)
WBC: 4.4 10*3/uL (ref 3.9–10.3)
lymph#: 1.6 10*3/uL (ref 0.9–3.3)
nRBC: 0 % (ref 0–0)

## 2011-07-20 MED ORDER — SODIUM CHLORIDE 0.9 % IJ SOLN
10.0000 mL | INTRAMUSCULAR | Status: DC | PRN
  Administered 2011-07-20: 10 mL
  Filled 2011-07-20: qty 10

## 2011-07-20 MED ORDER — DIPHENHYDRAMINE HCL 25 MG PO CAPS
25.0000 mg | ORAL_CAPSULE | Freq: Once | ORAL | Status: AC
  Administered 2011-07-20: 25 mg via ORAL

## 2011-07-20 MED ORDER — HEPARIN SOD (PORK) LOCK FLUSH 100 UNIT/ML IV SOLN
500.0000 [IU] | Freq: Once | INTRAVENOUS | Status: AC | PRN
  Administered 2011-07-20: 500 [IU]
  Filled 2011-07-20: qty 5

## 2011-07-20 MED ORDER — TRASTUZUMAB CHEMO INJECTION 440 MG
6.0000 mg/kg | Freq: Once | INTRAVENOUS | Status: AC
  Administered 2011-07-20: 357 mg via INTRAVENOUS
  Filled 2011-07-20: qty 17

## 2011-07-20 MED ORDER — SODIUM CHLORIDE 0.9 % IV SOLN
Freq: Once | INTRAVENOUS | Status: AC
  Administered 2011-07-20: 14:00:00 via INTRAVENOUS

## 2011-07-20 MED ORDER — ACETAMINOPHEN 325 MG PO TABS
650.0000 mg | ORAL_TABLET | Freq: Once | ORAL | Status: AC
  Administered 2011-07-20: 650 mg via ORAL

## 2011-07-20 NOTE — Progress Notes (Signed)
ID: JENICE LEINER   DOB: 1970-06-03  MR#: [id number]  WUJ#:[id number]  HISTORY OF PRESENT ILLNESS: Yerlin Gasparyan (goes by "Liticia Gasior") is a [place] woman who, at the age of 41, was referred by Dr. Coral Ceo for treatment of right breast carcinoma.  The patient palpated a mass in her right breast which she brought to Dr. Verdell Carmine attention. A prior screening mammogram on 02/06/2010 had been unremarkable. The patient was then scheduled for bilateral diagnostic mammogram and right breast ultrasonography on 12/22/2010, the studies demonstrating an ill defined mass with heterogeneous calcifications in the inner lower right breast, measuring 1.7 cm. The breasts are heterogeneously dense, but there were no other mammographic abnormalities in either breast. Clinically, there was a palpable firm mass at the 5:00 position of the right breast, 5 cm from the nipple. There were no palpable abnormalities in the right axilla. Ultrasound confirmed a 1.7 cm irregular, hypoechoic mass in the same position. There were some lymph nodes noted in the right exam of, one measuring 1.6 cm with a minimally thickened cortex inferiorly.  An ultrasound-guided core biopsy on 12/23/2010 [phone]) showed an invasive ductal carcinoma, grade 2, ER positive at 88%, PR positive at 11 is %, HER-2/neu positive with a FISH ratio of 4.61, and an MIB-1 of 36%.  The patient was referred to Dr. Claud Kelp and bilateral breast MRIs were obtained 12/30/2010. This showed the mass in question in the right breast to measure 2.0 cm. In the right axilla there was a 1.8 cm lymph node with a thickened cortex. The left breast was unremarkable. With this information she proceeded to neoadjuvant treatment and definitive surgery as detailed below   INTERVAL HISTORY: She did well with her surgery, with some pain and discomfort I, which continues, but is intermittent and mild to moderate. She is very concerned about range of motion  issues in the right arm and she will be discussing these with Dr. Kelly Splinter at their next visit, next week. She has had no fevers, no bleeding, and no other acute problems from the surgery itself.[name]  REVIEW OF SYSTEMS: Aside from postoperative issues, she is also having some post chemotherapy issues, including some of joint stiffness, a little bit of blurred vision, and hot flashes. She has not had a period since December of 2012. A detailed review of systems is otherwise unremarkable.  PAST MEDICAL HISTORY: Past Medical History  Diagnosis Date  . Migraines   . Asthma     as a child  . Breast cancer Sept 2012    right breast  . PONV (postoperative nausea and vomiting)   1. History of prior adenoidectomy. 2. History of rhinoplasty.   3. History of multiple uterine cervical treatments for precancerous lesions, none for the past 10 years or so. 4. History of fibroid tumor surgery. 5. History of D and C. 6. History of dysplastic nevi removed by Dr. Yetta Barre. 7. History of right "lazy eye" corrective surgery. 8. History of approximately 10-pack-years of tobacco abuse, resolved.  9. History of possible migraines (morning headaches). History of asthma as a child.  PAST SURGICAL HISTORY: Past Surgical History  Procedure Date  . Rhinoplasty   . Adenoidectomy   . Fibroid tumor surgery   . Dilation and curettage of uterus   . Uterine cervical treatments 2002    for precancerous lesions  . Lazy eye     corrective surgery  . Mastectomy w/ sentinel node biopsy 06/29/2011    Procedure: MASTECTOMY WITH SENTINEL LYMPH NODE  BIOPSY;  Surgeon: [name], MD;  Location: [facility];  Service: General;  Laterality: Right;  right skin spairing mstectomy and right sentinel lymph node biopsy  . Breast reconstruction 06/29/2011    Procedure: BREAST RECONSTRUCTION;  Surgeon: Wayland Denis, DO;  Location: [facility] OR;  Service: Plastics;  Laterality: Bilateral;  Immediate Bilateral Breast Reconstruction with Bilateral  Tissue Expanders and placement of  Alloderm     FAMILY HISTORY Family History  Problem Relation Age of Onset  . Adopted: Yes  . Heart attack Father   :  The patient's biological father died at the age of 42 from myocardial infarction.  The patient's biological mother is alive at age 42.  The patient was adopted by her biological mother's parents.  She has 3 half-brothers.  No sisters.  One cousin on her mother's side died from melanoma at the age of 22.  There is other cancer history in the adopted side but these are not even half-brothers or sisters, they are children of her adopted parents who are really her grandparents so these would be uncles and aunts (1 lymph node cancer, 1 prostate cancer, 1 breast cancer at the age of 61).  GYNECOLOGIC HISTORY: She had menarche age 41 or 41.  She is still having periods, the most recent 1 being September 13.  She is to have an IUD in place.  Currently is still using oral contraceptives and she has been advised to discontinue this.  She is GX, P1, first pregnancy to term at age 41.  SOCIAL HISTORY: She works in a Agricultural consultant.  Her husband, Giordana Weinheimer owns a family business called [employer].  They have been married 5 years.  They have a son, [name], age 79.  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  The patient is not a church attender.    ADVANCED DIRECTIVES: Not in place  HEALTH MAINTENANCE: History  Substance Use Topics  . Smoking status: Former Smoker    Quit date: 04/10/1998  . Smokeless tobacco: Never Used  . Alcohol Use: No     Colonoscopy:  PAP:  Bone density:  Lipid panel:  Allergies  Allergen Reactions  . Codeine Other (See Comments)    unkown  . Morphine And Related Other (See Comments)    anxiety  . Tussionex Pennkinetic Er (S-T Forte) Other (See Comments)    halucinations  . Penicillins Rash    Current Outpatient  Prescriptions  Medication Sig Dispense Refill  . Biotin 10 MG TABS Take 1 tablet by mouth daily.      [name] docusate sodium (COLACE) 100 MG capsule Take 100 mg by mouth 2 (two) times daily as needed. Constipation      . loratadine (CLARITIN) 10 MG tablet Take 10 mg by mouth daily.       [name] LORazepam (ATIVAN) 1 MG tablet TAKE ONE-HALF TABLET BY MOUTH TWICE DAILY AS NEEDED FOR ANXIETY  30 tablet  0  . prenatal vitamin w/FE, FA (PRENATAL 1 + 1) 27-1 MG TABS Take 1 tablet by mouth Daily.      [name] tobramycin-dexamethasone (TOBRADEX) ophthalmic solution Place 1 drop into both eyes Twice daily.         OBJECTIVE: A burly middle-aged white woman in no acute distress Filed Vitals:   07/20/11 1257  BP: 97/67  Pulse: 89  Temp: 98.6 F (37 C)  Body mass index is 22.90 kg/(m^2).    ECOG FS: 1  Sclerae unicteric Oropharynx clear No peripheral adenopathy, specifically the right axilla is unremarkable Lungs no rales or rhonchi Heart regular rate and rhythm Abd benign MSK no focal spinal tenderness, no peripheral edema Neuro: nonfocal Breasts: She is status post bilateral mastectomies with bilateral implants in place. Both incisions are healing nicely. There is a little bit of discomfort in the inferior aspect of the left implant, but there is no redness or swelling in that area. In the right axilla there is an obvious "bleeder" which is particularly noticeable at more than 90 abduction.  LAB RESULTS: Lab Results  Component Value Date   WBC 4.4 07/20/2011   NEUTROABS 2.2 07/20/2011   HGB 12.2 07/20/2011   HCT 35.5 07/20/2011   MCV 99.8 07/20/2011   PLT 239 07/20/2011      Chemistry      Component Value Date/Time   NA 140 06/21/2011 1430   K 4.1 06/21/2011 1430   CL 103 06/21/2011 1430   CO2 23 06/21/2011 1430   BUN 10 06/21/2011 1430   CREATININE 0.77 06/21/2011 1430      Component Value Date/Time   CALCIUM 10.0 06/21/2011 1430   ALKPHOS 79 06/21/2011 1430   AST 28 06/21/2011 1430   ALT 23  06/21/2011 1430   BILITOT 0.3 06/21/2011 1430       No results found for this basename: INR:1;PROTIME:1 in the last 168 hours  No results found for this basename: UACOL:1,UAPR:1,USPG:1,UPH:1,UTP:1,UGL:1,UKET:1,UBIL:1,UHGB:1,UNIT:1,UROB:1,ULEU:1,UEPI:1,UWBC:1,URBC:1,UBAC:1,CAST:1,CRYS:1,UCOM:1,BILUA:1 in the last 72 hours   STUDIES: Echocardiogram 05/26/2011 showed a well preserved ejection fraction.  ASSESSMENT: 41 year old [place], [name] woman,   (1) status post right breast biopsy in September 2012 for a clinically 2 cm invasive ductal carcinoma, with some enlarged Right axillary lymph nodes, the largest one of which was negative by biopsy. The tumor was grade 2, estrogen receptor positive at 88%, progesterone receptor positive at 11%, HER-2/neu positive by CISH with a ratio of 4.61, and a proliferation marker of 36%.   (2) treated in the neoadjuvant setting with Q 3 week docetaxel/ carboplatin/ trastuzumab x6, completed 05/18/2011  (3) s/p bilateral mastectomies with sentinel node biopsy 06/28/1969 for a residual right-sided ypT1c ypN0 invasive ductal carcinoma, grade 2, with immediate expander placement   PLAN: She has a good prognosis overall and she will make it better by taking anti-estrogens for 5-10 years. Ideally we would start with tamoxifen. She is afraid of the drug because she has heard that it causes her [name] weight gain. I gave her written information on tamoxifen and letrozole today. In the meantime we are continuing on trastuzumab every 3 weeks. She will continue to have every 3 months echoes  until she is done with her year of trastuzumab. She is going to see [place] again on May 23. We should be starting tamoxifen at that visit. Jaylon Grode C    07/20/2011

## 2011-07-20 NOTE — Telephone Encounter (Signed)
gave patient appointment for 08-31-2011 printed out calendar and gave to the patient

## 2011-07-21 ENCOUNTER — Encounter (INDEPENDENT_AMBULATORY_CARE_PROVIDER_SITE_OTHER): Payer: Self-pay | Admitting: General Surgery

## 2011-07-21 ENCOUNTER — Ambulatory Visit (INDEPENDENT_AMBULATORY_CARE_PROVIDER_SITE_OTHER): Payer: BC Managed Care – PPO | Admitting: General Surgery

## 2011-07-21 VITALS — BP 100/66 | HR 68 | Temp 97.3°F | Resp 16 | Ht 63.0 in | Wt 130.0 lb

## 2011-07-21 DIAGNOSIS — C50919 Malignant neoplasm of unspecified site of unspecified female breast: Secondary | ICD-10-CM

## 2011-07-21 NOTE — Progress Notes (Signed)
Subjective:     Patient ID: [name], female   DOB: [birth year], 41 y.o.   MRN: [id number]  HPI  This 41 year old woman underwent bilateral mastectomies and right axillary sentinel lobe biopsy on June 29, 2011. She had immediate reconstruction by Dr. Kelly Splinter with tissue expanders. She is doing well surgically.  Pathology is invasive ductal carcinoma, receptor positive, HER-2 positive, ypT1c., ypN0 on the right. The left breast was prophylactic and there was no pathologic change.  She has  all of her drains  out. She has not been referred to physical therapy by Dr. Kelly Splinter. She will see Dr. Kelly Splinter  her next Tuesday.  She is going to continue adjuvant Herceptin until November. She plans to start adjuvant tamoxifen in May.  She also questions about her pathology report and I spent a long time discussing this with her. Review of Systems     Objective:   Physical Exam Patient looks well. She is in no distress. Her husband is with her.  Range of motion her shoulders passively is about 120. A little bit tighter on the right than the left.  Bilateral mastectomies skin flaps with good. She has been expanded somewhat. No fluid collection. No infection.    Assessment:     Invasive ductal carcinoma right breast, triple positive tumor, status post neoadjuvant chemotherapy, ypT1c, ypNo.  Doing well in the early postoperative following bilateral mastectomy, right sentinel node biopsy, and bilateral tissue expanders.   Plan:     See Dr. Kelly Splinter next Tuesday, and hopefully begin formal physical therapy for shoulder range of motion.  Continue Herceptin and tamoxifen as advised by Dr. Darnelle Catalan.  Return to see me in 5 months.    Angelia Mould. Derrell Lolling, M.D., [facility] General and Minimally invasive Surgery Breast and Colorectal Surgery Office:   [phone] Pager:   [phone]

## 2011-07-21 NOTE — Patient Instructions (Signed)
You seem to be due doing very well following your bilateral mastectomies.  Please asked Dr. Kelly Splinter to refer you to physical therapy when you see her on Tuesday. You need to get all the range of motion back in your shoulders.  Keep all appointments with Dr. Darnelle Catalan.  Return to see Dr. Derrell Lolling in September 2013.

## 2011-07-23 ENCOUNTER — Encounter: Payer: Self-pay | Admitting: Oncology

## 2011-07-23 ENCOUNTER — Other Ambulatory Visit: Payer: Self-pay | Admitting: Oncology

## 2011-08-09 ENCOUNTER — Other Ambulatory Visit: Payer: Self-pay | Admitting: Physician Assistant

## 2011-08-09 DIAGNOSIS — C50919 Malignant neoplasm of unspecified site of unspecified female breast: Secondary | ICD-10-CM

## 2011-08-10 ENCOUNTER — Other Ambulatory Visit (HOSPITAL_BASED_OUTPATIENT_CLINIC_OR_DEPARTMENT_OTHER): Payer: BC Managed Care – PPO | Admitting: Lab

## 2011-08-10 ENCOUNTER — Ambulatory Visit (HOSPITAL_BASED_OUTPATIENT_CLINIC_OR_DEPARTMENT_OTHER): Payer: BC Managed Care – PPO

## 2011-08-10 VITALS — BP 115/76 | HR 76 | Temp 97.5°F

## 2011-08-10 DIAGNOSIS — C50319 Malignant neoplasm of lower-inner quadrant of unspecified female breast: Secondary | ICD-10-CM

## 2011-08-10 DIAGNOSIS — N63 Unspecified lump in unspecified breast: Secondary | ICD-10-CM

## 2011-08-10 DIAGNOSIS — Z5112 Encounter for antineoplastic immunotherapy: Secondary | ICD-10-CM

## 2011-08-10 DIAGNOSIS — C50919 Malignant neoplasm of unspecified site of unspecified female breast: Secondary | ICD-10-CM

## 2011-08-10 LAB — CBC WITH DIFFERENTIAL/PLATELET
BASO%: 0.2 % (ref 0.0–2.0)
Basophils Absolute: 0 10*3/uL (ref 0.0–0.1)
EOS%: 2.6 % (ref 0.0–7.0)
Eosinophils Absolute: 0.1 10*3/uL (ref 0.0–0.5)
HCT: 36.9 % (ref 34.8–46.6)
HGB: 12.3 g/dL (ref 11.6–15.9)
LYMPH%: 36.2 % (ref 14.0–49.7)
MCH: 31.6 pg (ref 25.1–34.0)
MCHC: 33.3 g/dL (ref 31.5–36.0)
MCV: 94.9 fL (ref 79.5–101.0)
MONO#: 0.2 10*3/uL (ref 0.1–0.9)
MONO%: 5.1 % (ref 0.0–14.0)
NEUT#: 2.4 10*3/uL (ref 1.5–6.5)
NEUT%: 55.9 % (ref 38.4–76.8)
Platelets: 180 10*3/uL (ref 145–400)
RBC: 3.89 10*6/uL (ref 3.70–5.45)
RDW: 12 % (ref 11.2–14.5)
WBC: 4.3 10*3/uL (ref 3.9–10.3)
lymph#: 1.6 10*3/uL (ref 0.9–3.3)

## 2011-08-10 MED ORDER — SODIUM CHLORIDE 0.9 % IJ SOLN
10.0000 mL | INTRAMUSCULAR | Status: DC | PRN
  Administered 2011-08-10: 10 mL
  Filled 2011-08-10: qty 10

## 2011-08-10 MED ORDER — HEPARIN SOD (PORK) LOCK FLUSH 100 UNIT/ML IV SOLN
500.0000 [IU] | Freq: Once | INTRAVENOUS | Status: AC | PRN
  Administered 2011-08-10: 500 [IU]
  Filled 2011-08-10: qty 5

## 2011-08-10 MED ORDER — TRASTUZUMAB CHEMO INJECTION 440 MG
6.0000 mg/kg | Freq: Once | INTRAVENOUS | Status: AC
  Administered 2011-08-10: 357 mg via INTRAVENOUS
  Filled 2011-08-10: qty 17

## 2011-08-10 MED ORDER — SODIUM CHLORIDE 0.9 % IV SOLN
Freq: Once | INTRAVENOUS | Status: AC
  Administered 2011-08-10: 14:00:00 via INTRAVENOUS

## 2011-08-10 NOTE — Progress Notes (Signed)
1330-Pt refused to take Tylenol and Benadryl today.  Pt took Hydrocodone prior to arrival at [facility].

## 2011-08-10 NOTE — Progress Notes (Signed)
Encounter addended by: Noralee Space, RN on: 08/10/2011 12:48 PM<BR>     Documentation filed: Orders

## 2011-08-10 NOTE — Patient Instructions (Signed)
[facility] Discharge Instructions for Patients Receiving Chemotherapy  Today you received the following chemotherapy agents Herceptin.  To help prevent nausea and vomiting after your treatment, we encourage you to take your nausea medication as ordered per MD.    If you develop nausea and vomiting that is not controlled by your nausea medication, call the clinic. If it is after clinic hours your family physician or the after hours number for the clinic or go to the Emergency Department.   BELOW ARE SYMPTOMS THAT SHOULD BE REPORTED IMMEDIATELY:  *FEVER GREATER THAN 100.5 F  *CHILLS WITH OR WITHOUT FEVER  NAUSEA AND VOMITING THAT IS NOT CONTROLLED WITH YOUR NAUSEA MEDICATION  *UNUSUAL SHORTNESS OF BREATH  *UNUSUAL BRUISING OR BLEEDING  TENDERNESS IN MOUTH AND THROAT WITH OR WITHOUT PRESENCE OF ULCERS  *URINARY PROBLEMS  *BOWEL PROBLEMS  UNUSUAL RASH Items with * indicate a potential emergency and should be followed up as soon as possible.  . Please let the nurse know about any problems that you may have experienced. Feel free to call the clinic you have any questions or concerns. The clinic phone number is [phone].   I have been informed and understand all the instructions given to me. I know to contact the clinic, my physician, or go to the Emergency Department if any problems should occur. I do not have any questions at this time, but understand that I may call the clinic during office hours   should I have any questions or need assistance in obtaining follow up care.    __________________________________________  _____________  __________ Signature of Patient or Authorized Representative            Date                   Time    __________________________________________ Nurse's Signature    

## 2011-08-24 ENCOUNTER — Other Ambulatory Visit (HOSPITAL_COMMUNITY): Payer: BC Managed Care – PPO

## 2011-08-24 ENCOUNTER — Encounter (HOSPITAL_COMMUNITY): Payer: BC Managed Care – PPO

## 2011-08-30 ENCOUNTER — Ambulatory Visit: Payer: BC Managed Care – PPO | Attending: Plastic Surgery | Admitting: Physical Therapy

## 2011-08-30 ENCOUNTER — Ambulatory Visit: Payer: BC Managed Care – PPO | Admitting: Physician Assistant

## 2011-08-30 DIAGNOSIS — M25619 Stiffness of unspecified shoulder, not elsewhere classified: Secondary | ICD-10-CM | POA: Insufficient documentation

## 2011-08-30 DIAGNOSIS — M25519 Pain in unspecified shoulder: Secondary | ICD-10-CM | POA: Insufficient documentation

## 2011-08-30 DIAGNOSIS — IMO0001 Reserved for inherently not codable concepts without codable children: Secondary | ICD-10-CM | POA: Insufficient documentation

## 2011-08-30 NOTE — Progress Notes (Signed)
Encounter opened in error on 08/30/11. No charge.  Zollie Scale, PA-C  08/30/2011

## 2011-08-31 ENCOUNTER — Ambulatory Visit (HOSPITAL_BASED_OUTPATIENT_CLINIC_OR_DEPARTMENT_OTHER): Payer: BC Managed Care – PPO | Admitting: Physician Assistant

## 2011-08-31 ENCOUNTER — Encounter: Payer: Self-pay | Admitting: Physician Assistant

## 2011-08-31 ENCOUNTER — Other Ambulatory Visit (HOSPITAL_BASED_OUTPATIENT_CLINIC_OR_DEPARTMENT_OTHER): Payer: BC Managed Care – PPO | Admitting: Lab

## 2011-08-31 ENCOUNTER — Ambulatory Visit (HOSPITAL_BASED_OUTPATIENT_CLINIC_OR_DEPARTMENT_OTHER): Payer: BC Managed Care – PPO

## 2011-08-31 ENCOUNTER — Other Ambulatory Visit: Payer: BC Managed Care – PPO | Admitting: Lab

## 2011-08-31 VITALS — BP 96/66 | HR 66 | Temp 98.7°F | Ht 63.0 in | Wt 127.4 lb

## 2011-08-31 DIAGNOSIS — C50919 Malignant neoplasm of unspecified site of unspecified female breast: Secondary | ICD-10-CM

## 2011-08-31 DIAGNOSIS — Z170 Estrogen receptor positive status [ER+]: Secondary | ICD-10-CM

## 2011-08-31 DIAGNOSIS — C50319 Malignant neoplasm of lower-inner quadrant of unspecified female breast: Secondary | ICD-10-CM

## 2011-08-31 DIAGNOSIS — N63 Unspecified lump in unspecified breast: Secondary | ICD-10-CM

## 2011-08-31 DIAGNOSIS — Z5112 Encounter for antineoplastic immunotherapy: Secondary | ICD-10-CM

## 2011-08-31 LAB — CBC WITH DIFFERENTIAL/PLATELET
BASO%: 0.3 % (ref 0.0–2.0)
Basophils Absolute: 0 10*3/uL (ref 0.0–0.1)
EOS%: 1.7 % (ref 0.0–7.0)
Eosinophils Absolute: 0.1 10*3/uL (ref 0.0–0.5)
HCT: 37.2 % (ref 34.8–46.6)
HGB: 12.4 g/dL (ref 11.6–15.9)
LYMPH%: 34.2 % (ref 14.0–49.7)
MCH: 32.2 pg (ref 25.1–34.0)
MCHC: 33.4 g/dL (ref 31.5–36.0)
MCV: 96.4 fL (ref 79.5–101.0)
MONO#: 0.3 10*3/uL (ref 0.1–0.9)
MONO%: 6.6 % (ref 0.0–14.0)
NEUT#: 2.5 10*3/uL (ref 1.5–6.5)
NEUT%: 57.2 % (ref 38.4–76.8)
Platelets: 179 10*3/uL (ref 145–400)
RBC: 3.86 10*6/uL (ref 3.70–5.45)
RDW: 12.4 % (ref 11.2–14.5)
WBC: 4.4 10*3/uL (ref 3.9–10.3)
lymph#: 1.5 10*3/uL (ref 0.9–3.3)
nRBC: 0 % (ref 0–0)

## 2011-08-31 LAB — COMPREHENSIVE METABOLIC PANEL
ALT: 15 U/L (ref 0–35)
AST: 18 U/L (ref 0–37)
Albumin: 4.1 g/dL (ref 3.5–5.2)
Alkaline Phosphatase: 87 U/L (ref 39–117)
BUN: 10 mg/dL (ref 6–23)
CO2: 29 mEq/L (ref 19–32)
Calcium: 9.4 mg/dL (ref 8.4–10.5)
Chloride: 103 mEq/L (ref 96–112)
Creatinine, Ser: 0.79 mg/dL (ref 0.50–1.10)
Glucose, Bld: 96 mg/dL (ref 70–99)
Potassium: 4.1 mEq/L (ref 3.5–5.3)
Sodium: 139 mEq/L (ref 135–145)
Total Bilirubin: 0.3 mg/dL (ref 0.3–1.2)
Total Protein: 6.5 g/dL (ref 6.0–8.3)

## 2011-08-31 MED ORDER — SODIUM CHLORIDE 0.9 % IV SOLN
Freq: Once | INTRAVENOUS | Status: AC
  Administered 2011-08-31: 15:00:00 via INTRAVENOUS

## 2011-08-31 MED ORDER — TRASTUZUMAB CHEMO INJECTION 440 MG
6.0000 mg/kg | Freq: Once | INTRAVENOUS | Status: AC
  Administered 2011-08-31: 357 mg via INTRAVENOUS
  Filled 2011-08-31: qty 17

## 2011-08-31 NOTE — Progress Notes (Signed)
Patient refused Tylenol and Benadryl pre-medications.  Informed nurse that she had already taken them prior to arriving at [facility]

## 2011-08-31 NOTE — Progress Notes (Signed)
ID: HOLLIN CREWE   DOB: 07-29-1970  MR#: [id number]  JXB#:[id number]  HISTORY OF PRESENT ILLNESS: [name] (goes by "[name]") is a [place] woman who, at the age of 41, was referred by Dr. Coral Ceo for treatment of right breast carcinoma.  The patient palpated a mass in her right breast which she brought to Dr. Verdell Carmine attention. A prior screening mammogram on 02/06/2010 had been unremarkable. The patient was then scheduled for bilateral diagnostic mammogram and right breast ultrasonography on 12/22/2010, the studies demonstrating an ill defined mass with heterogeneous calcifications in the inner lower right breast, measuring 1.7 cm. The breasts are heterogeneously dense, but there were no other mammographic abnormalities in either breast. Clinically, there was a palpable firm mass at the 5:00 position of the right breast, 5 cm from the nipple. There were no palpable abnormalities in the right axilla. Ultrasound confirmed a 1.7 cm irregular, hypoechoic mass in the same position. There were some lymph nodes noted in the right exam of, one measuring 1.6 cm with a minimally thickened cortex inferiorly.  An ultrasound-guided core biopsy on 12/23/2010 [phone]) showed an invasive ductal carcinoma, grade 2, ER positive at 88%, PR positive at 11 is %, HER-2/neu positive with a FISH ratio of 4.61, and an MIB-1 of 36%.  The patient was referred to Dr. Claud Kelp and bilateral breast MRIs were obtained 12/30/2010. This showed the mass in question in the right breast to measure 2.0 cm. In the right axilla there was a 1.8 cm lymph node with a thickened cortex. The left breast was unremarkable. With this information she proceeded to neoadjuvant treatment and definitive surgery as detailed below   INTERVAL HISTORY:  [name] returns today for followup of her right breast carcinoma. She continues to receive trastuzumab on a Q. three-week basis and is due for her next dose  today.  [name] has returned to work full time which is going "okay" although she is quite tired today. She's been seen at the lymphedema clinic regarding her decreased range of motion in the right arm. She continues to follow with Dr. Kelly Splinter and has her expanders in place.  She still having quite a bit of pain in the lower extremities, sometimes aching, sometimes burning, and sometimes simply joint pain and stiffness.  No swelling in the lower extremities.  She is also been doing quite a bit of research on the antiestrogen therapy is, specifically tamoxifen versus the aromatase inhibitors. She had multiple questions today. In fact over half of our one-hour appointment was spent reviewing those medications, reviewing their benefits and possible toxicities, and making decisions regarding her care plan.  REVIEW OF SYSTEMS:  [name] has had no recent illnesses and denies fevers, chills, or night sweats.  She tells me her stomach "growls a lot" but she denies any nausea or change in bowel habits. She's had no new cough, phlegm production, orthopnea or shortness of breath. No chest pain. No abnormal headaches or dizziness. She has a little swelling from time to time in the right upper extremity, and also has a "cyst" on her forearm she wanted [place] to look at today. (This has apparently been there for a long time, even prior to her diagnosis.)  A detailed review of systems is otherwise noncontributory.  PAST MEDICAL HISTORY: Past Medical History  Diagnosis Date  . Migraines   . Asthma     as a child  . Breast cancer Sept 2012    right breast  . PONV (postoperative  nausea and vomiting)   . Rash     neck  1. History of prior adenoidectomy. 2. History of rhinoplasty.   3. History of multiple uterine cervical treatments for precancerous lesions, none for the past 10 years or so. 4. History of fibroid tumor surgery. 5. History of D and C. 6. History of dysplastic nevi removed by Dr. Yetta Barre. 7. History of  right "lazy eye" corrective surgery. 8. History of approximately 10-pack-years of tobacco abuse, resolved.  9. History of possible migraines (morning headaches). History of asthma as a child.  PAST SURGICAL HISTORY: Past Surgical History  Procedure Date  . Rhinoplasty   . Adenoidectomy   . Fibroid tumor surgery   . Dilation and curettage of uterus   . Uterine cervical treatments 2002    for precancerous lesions  . Lazy eye     corrective surgery  . Mastectomy w/ sentinel node biopsy 06/29/2011    Procedure: MASTECTOMY WITH SENTINEL LYMPH NODE BIOPSY;  Surgeon: [name], MD;  Location: [facility] OR;  Service: General;  Laterality: Right;  right skin spairing mstectomy and right sentinel lymph node biopsy  . Breast reconstruction 06/29/2011    Procedure: BREAST RECONSTRUCTION;  Surgeon: Wayland Denis, DO;  Location: [facility] OR;  Service: Plastics;  Laterality: Bilateral;  Immediate Bilateral Breast Reconstruction with Bilateral Tissue Expanders and placement of  Alloderm     FAMILY HISTORY Family History  Problem Relation Age of Onset  . Adopted: Yes  . Heart attack Father   . Cancer Brother     prostate and lung  . Cancer Cousin     melanoma  . Cancer Cousin     breast  :  The patient's biological father died at the age of 79 from myocardial infarction.  The patient's biological mother is alive at age 20.  The patient was adopted by her biological mother's parents.  She has 3 half-brothers.  No sisters.  One cousin on her mother's side died from melanoma at the age of 77.  There is other cancer history in the adopted side but these are not even half-brothers or sisters, they are children of her adopted parents who are really her grandparents so these would be uncles and aunts (1 lymph node cancer, 1 prostate cancer, 1 breast cancer at the age of 67).  GYNECOLOGIC HISTORY: She had menarche age 84 or 78.  She is still having periods, the most recent 1 being September 13.  She is to have an  IUD in place.  Currently is still using oral contraceptives and she has been advised to discontinue this.  She is GX, P1, first pregnancy to term at age 68.  SOCIAL HISTORY: She works in a Agricultural consultant.  Her husband, [name] owns a family business called [employer].  They have been married 5 years.  They have a son, [name], age 4.  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  The patient is not a church attender.    ADVANCED DIRECTIVES: Not in place  HEALTH MAINTENANCE: History  Substance Use Topics  . Smoking status: Former Smoker    Quit date: 07/20/1996  . Smokeless tobacco: Never Used  . Alcohol Use: No     Colonoscopy:  PAP:  Bone density:  Lipid panel:  Allergies  Allergen Reactions  . Tussionex Pennkinetic Er (Hydrocod Polst-Cpm Polst Er) Other (See Comments)  halucinations  . Codeine Other (See Comments)    unkown  . Morphine And Related Anxiety    anxiety  . Penicillins Rash    Current Outpatient Prescriptions  Medication Sig Dispense Refill  . b complex vitamins tablet Take 1 tablet by mouth daily.      . Biotin 10 MG TABS Take 1 tablet by mouth daily.      . Calcium Carbonate-Vitamin D (CALCIUM-VITAMIN D) 500-200 MG-UNIT per tablet Take 1 tablet by mouth 2 (two) times daily with a meal.      . docusate sodium (COLACE) 100 MG capsule Take 100 mg by mouth 2 (two) times daily as needed. Constipation      . gabapentin (NEURONTIN) 100 MG capsule       . loratadine (CLARITIN) 10 MG tablet Take 10 mg by mouth daily.       [name] LORazepam (ATIVAN) 1 MG tablet TAKE ONE-HALF TABLET BY MOUTH TWICE DAILY AS NEEDED FOR ANXIETY  30 tablet  0  . prenatal vitamin w/FE, FA (PRENATAL 1 + 1) 27-1 MG TABS Take 1 tablet by mouth Daily.      [name] tobramycin-dexamethasone (TOBRADEX) ophthalmic solution Place 1 drop into both eyes Twice daily.       [name] HYDROcodone-acetaminophen (NORCO) 5-325 MG  per tablet as needed.       No current facility-administered medications for this visit.   Facility-Administered Medications Ordered in Other Visits  Medication Dose Route Frequency Provider Last Rate Last Dose  . 0.9 %  sodium chloride infusion   Intravenous Once Lowella Dell, MD      . trastuzumab (HERCEPTIN) 357 mg in sodium chloride 0.9 % 250 mL chemo infusion  6 mg/kg (Treatment Plan Actual) Intravenous Once Lowella Dell, MD   357 mg at 08/31/11 1450    OBJECTIVE: A burly middle-aged white woman in no acute distress Filed Vitals:   08/31/11 1322  BP: 96/66  Pulse: 66  Temp: 98.7 F (37.1 C)     Body mass index is 22.57 kg/(m^2).    ECOG FS: 0  Filed Weights   08/31/11 1322  Weight: 127 lb 6.4 oz (57.788 kg)   Physical Exam: HEENT:  Sclerae anicteric, conjunctivae pink.  Oropharynx clear.     Nodes:  No cervical, supraclavicular, or axillary lymphadenopathy palpated.  Breast Exam:  Patient status post bilateral mastectomies with reconstruction, expanders intact bilaterally. Lungs:  Clear to auscultation bilaterally.  No crackles, rhonchi, or wheezes.   Heart:  Regular rate and rhythm.   Abdomen:  Soft, thin, nontender.  Positive bowel sounds.  No organomegaly or masses palpated.   Musculoskeletal:  No focal spinal tenderness to palpation.  Extremities:  There is a very small, soft, movable mass just beneath the antecubital fossa in the right upper extremity. No erythema noted. No tenderness to palpation.  No peripheral edema or cyanosis.   Skin:  Benign.   Neuro:  Nonfocal.    LAB RESULTS: Lab Results  Component Value Date   WBC 4.4 08/31/2011   NEUTROABS 2.5 08/31/2011   HGB 12.4 08/31/2011   HCT 37.2 08/31/2011   MCV 96.4 08/31/2011   PLT 179 08/31/2011      Chemistry      Component Value Date/Time   NA 139 08/31/2011 1244   K 4.1 08/31/2011 1244   CL 103 08/31/2011 1244   CO2 29 08/31/2011 1244   BUN 10 08/31/2011 1244   CREATININE 0.79 08/31/2011 1244  Component Value Date/Time   CALCIUM 9.4 08/31/2011 1244   ALKPHOS 87 08/31/2011 1244   AST 18 08/31/2011 1244   ALT 15 08/31/2011 1244   BILITOT 0.3 08/31/2011 1244        STUDIES: Echocardiogram 05/26/2011 showed a well preserved ejection fraction. This is scheduled to be repeated on 09/18/2011 and is followed by Dr. Gala Romney.  ASSESSMENT: 41 year old [place], [name] woman,   (1) status post right breast biopsy in September 2012 for a clinically 2 cm invasive ductal carcinoma, with some enlarged Right axillary lymph nodes, the largest one of which was negative by biopsy. The tumor was grade 2, estrogen receptor positive at 88%, progesterone receptor positive at 11%, HER-2/neu positive by CISH with a ratio of 4.61, and a proliferation marker of 36%.   (2) treated in the neoadjuvant setting with Q 3 week docetaxel/ carboplatin/ trastuzumab x6, completed 05/18/2011  (3) s/p bilateral mastectomies with sentinel node biopsy 06/28/1969 for a residual right-sided ypT1c ypN0 invasive ductal carcinoma, grade 2, with immediate expander placement  (4)  continuing on trastuzumab given every 3 weeks, the plan being to complete a total of one year.   (5)  Ready to initiate anti-estrogen therapy.    PLAN:  This case was reviewed with Dr. Darnelle Catalan who also spoke extensively with the patient today regarding her choices with anti-estrogen therapy. After much discussion, she has decided to proceed with tamoxifen, and I have given her prescription today accordingly.  [name] will receive her trastuzumab today as scheduled and will continue to be treated every 3 weeks. As noted above, she is already scheduled for repeat echocardiogram and followup with Dr. Gala Romney in early June. We will plan on seeing her back in 6-8 weeks to assess tolerance of the tamoxifen.   Hays Dunnigan    08/31/2011

## 2011-09-05 ENCOUNTER — Ambulatory Visit: Payer: BC Managed Care – PPO | Admitting: Physical Therapy

## 2011-09-06 ENCOUNTER — Telehealth: Payer: Self-pay | Admitting: Oncology

## 2011-09-06 NOTE — Telephone Encounter (Signed)
Mailed the pt her July,aug 2013 appt calendar.

## 2011-09-11 ENCOUNTER — Ambulatory Visit: Payer: BC Managed Care – PPO | Attending: Plastic Surgery | Admitting: Physical Therapy

## 2011-09-11 DIAGNOSIS — IMO0001 Reserved for inherently not codable concepts without codable children: Secondary | ICD-10-CM | POA: Insufficient documentation

## 2011-09-11 DIAGNOSIS — M25519 Pain in unspecified shoulder: Secondary | ICD-10-CM | POA: Insufficient documentation

## 2011-09-11 DIAGNOSIS — M25619 Stiffness of unspecified shoulder, not elsewhere classified: Secondary | ICD-10-CM | POA: Insufficient documentation

## 2011-09-13 ENCOUNTER — Ambulatory Visit: Payer: BC Managed Care – PPO

## 2011-09-18 ENCOUNTER — Ambulatory Visit: Payer: BC Managed Care – PPO | Admitting: Physical Therapy

## 2011-09-18 ENCOUNTER — Ambulatory Visit (HOSPITAL_COMMUNITY)
Admission: RE | Admit: 2011-09-18 | Discharge: 2011-09-18 | Disposition: A | Discharge status: Home / Self Care | Payer: BC Managed Care – PPO | Source: Ambulatory Visit | Attending: Internal Medicine | Admitting: Internal Medicine

## 2011-09-18 ENCOUNTER — Encounter (HOSPITAL_COMMUNITY): Payer: Self-pay

## 2011-09-18 VITALS — BP 105/61 | HR 67 | Ht 60.0 in | Wt 126.4 lb

## 2011-09-18 DIAGNOSIS — C50919 Malignant neoplasm of unspecified site of unspecified female breast: Secondary | ICD-10-CM | POA: Insufficient documentation

## 2011-09-18 DIAGNOSIS — Z09 Encounter for follow-up examination after completed treatment for conditions other than malignant neoplasm: Secondary | ICD-10-CM

## 2011-09-18 DIAGNOSIS — N63 Unspecified lump in unspecified breast: Secondary | ICD-10-CM

## 2011-09-18 NOTE — Assessment & Plan Note (Addendum)
All ECHOs reviewed by Dr Gala Romney. EF and lateral S' remain stable. No evidence of cardiotoxicity. Continue Herceptin per Dr Darnelle Catalan. Follow up in 3 months with an ECHO.    Attending: Patient seen and examined with Tonye Becket, NP. We discussed all aspects of the encounter. I agree with the assessment and plan as stated above. I have reviewed all echos personal. No clinical or echocardiographic signs or symptoms of cardiotoxicity - can continue Herceptin. Will f/u in 3 months.

## 2011-09-18 NOTE — Progress Notes (Signed)
Patient ID: [name], female   DOB: 1970/09/23, 41 y.o.   MRN: [id number]  General Surgeon: Dr Derrell Lolling Plastic Surgeon: Dr Kelly Splinter Oncologist: Dr Darnelle Catalan  HPI: [name] is a 41 yo with recently diagnosed (12/22/2010) right invasive ductal carcinoma, grade 2, estrogen receptor positive, progesterone receptor positive and Her2 amplification. She was started on carboplatin and docetaxel together with Herceptin every 3 weeks x6 (first dose last week) then Herceptin is to be continued for a total of 1 year. She was referred to Dr. Gala Romney for close monitoring for cardiotoxicity while on herceptin.   Echo:  01/19/11: EF 50-55%. Lateral S' velocity 10.5 cm/s  05/26/11: EF 55%. Lateral s' velocity 10.7 to 12.9 cm/s  09/18/11 EF 55% lat s' 11.5  06/30/11 S/P bilateral mastectomies and placement of tissue expanders  She returns for follow up today with echo. She continues on herceptin  q3 weeks for 1 year.She will complete herceptin, November 2013.  She feels good. Denies dyspnea/orthopnea/PND/CP. [name] Continues to work full time.    ROS: All systems negative except as listed in HPI, PMH and Problem List.  Past Medical History  Diagnosis Date  . Migraines   . Asthma     as a child  . Breast cancer Sept 2012    right breast  . PONV (postoperative nausea and vomiting)   . Rash     neck    Current Outpatient Prescriptions  Medication Sig Dispense Refill  . b complex vitamins tablet Take 1 tablet by mouth daily.      . Biotin 10 MG TABS Take 1 tablet by mouth daily.      . Calcium Carbonate-Vitamin D (CALCIUM-VITAMIN D) 500-200 MG-UNIT per tablet Take 1 tablet by mouth 2 (two) times daily with a meal.      . docusate sodium (COLACE) 100 MG capsule Take 100 mg by mouth 2 (two) times daily as needed. Constipation      . gabapentin (NEURONTIN) 100 MG capsule       . HYDROcodone-acetaminophen (NORCO) 5-325 MG per tablet as needed.      . loratadine (CLARITIN) 10 MG tablet Take 10 mg by  mouth daily.       [name] LORazepam (ATIVAN) 1 MG tablet TAKE ONE-HALF TABLET BY MOUTH TWICE DAILY AS NEEDED FOR ANXIETY  30 tablet  0  . prenatal vitamin w/FE, FA (PRENATAL 1 + 1) 27-1 MG TABS Take 1 tablet by mouth Daily.      [name] tobramycin-dexamethasone (TOBRADEX) ophthalmic solution Place 1 drop into both eyes Twice daily.          PHYSICAL EXAM: Filed Vitals:   09/18/11 1508  BP: 105/61  Pulse: 67   Weight change:  General:  Well appearing. No resp difficulty HEENT: normal Neck: supple. JVP flat. Carotids 2+ bilaterally; no bruits. No lymphadenopathy or thryomegaly appreciated. Cor: PMI normal. Regular rate & rhythm. No rubs, gallops or murmurs. L upper chest porta cath Lungs: clear Abdomen: soft, nontender, nondistended. No hepatosplenomegaly. No bruits or masses. Good bowel sounds. Extremities: no cyanosis, clubbing, rash, edema Neuro: alert & orientedx3, cranial nerves grossly intact. Moves all 4 extremities w/o difficulty. Affect pleasant.    ECG:   ASSESSMENT & PLAN:

## 2011-09-18 NOTE — Progress Notes (Signed)
*  PRELIMINARY RESULTS* Echocardiogram 2D Echocardiogram has been performed.  Glean Salen [facility] 09/18/2011, 3:12 PM

## 2011-09-18 NOTE — Patient Instructions (Signed)
Follow up in 3  Months with an ECHO  

## 2011-09-20 ENCOUNTER — Ambulatory Visit: Payer: BC Managed Care – PPO

## 2011-09-21 ENCOUNTER — Other Ambulatory Visit (HOSPITAL_BASED_OUTPATIENT_CLINIC_OR_DEPARTMENT_OTHER): Payer: BC Managed Care – PPO | Admitting: Lab

## 2011-09-21 ENCOUNTER — Ambulatory Visit (HOSPITAL_BASED_OUTPATIENT_CLINIC_OR_DEPARTMENT_OTHER): Payer: BC Managed Care – PPO

## 2011-09-21 VITALS — BP 96/66 | HR 76 | Temp 97.6°F

## 2011-09-21 DIAGNOSIS — N63 Unspecified lump in unspecified breast: Secondary | ICD-10-CM

## 2011-09-21 DIAGNOSIS — C50919 Malignant neoplasm of unspecified site of unspecified female breast: Secondary | ICD-10-CM

## 2011-09-21 DIAGNOSIS — Z5112 Encounter for antineoplastic immunotherapy: Secondary | ICD-10-CM

## 2011-09-21 DIAGNOSIS — C50319 Malignant neoplasm of lower-inner quadrant of unspecified female breast: Secondary | ICD-10-CM

## 2011-09-21 LAB — CBC WITH DIFFERENTIAL/PLATELET
BASO%: 0.4 % (ref 0.0–2.0)
Basophils Absolute: 0 10*3/uL (ref 0.0–0.1)
EOS%: 1.5 % (ref 0.0–7.0)
Eosinophils Absolute: 0.1 10*3/uL (ref 0.0–0.5)
HCT: 36.8 % (ref 34.8–46.6)
HGB: 12.5 g/dL (ref 11.6–15.9)
LYMPH%: 39.3 % (ref 14.0–49.7)
MCH: 31.9 pg (ref 25.1–34.0)
MCHC: 33.9 g/dL (ref 31.5–36.0)
MCV: 94 fL (ref 79.5–101.0)
MONO#: 0.3 10*3/uL (ref 0.1–0.9)
MONO%: 6.5 % (ref 0.0–14.0)
NEUT#: 2.3 10*3/uL (ref 1.5–6.5)
NEUT%: 52.3 % (ref 38.4–76.8)
Platelets: 168 10*3/uL (ref 145–400)
RBC: 3.91 10*6/uL (ref 3.70–5.45)
RDW: 12.6 % (ref 11.2–14.5)
WBC: 4.4 10*3/uL (ref 3.9–10.3)
lymph#: 1.7 10*3/uL (ref 0.9–3.3)

## 2011-09-21 MED ORDER — ACETAMINOPHEN 325 MG PO TABS
650.0000 mg | ORAL_TABLET | Freq: Once | ORAL | Status: AC
  Administered 2011-09-21: 650 mg via ORAL

## 2011-09-21 MED ORDER — TRASTUZUMAB CHEMO INJECTION 440 MG
6.0000 mg/kg | Freq: Once | INTRAVENOUS | Status: AC
  Administered 2011-09-21: 357 mg via INTRAVENOUS
  Filled 2011-09-21: qty 17

## 2011-09-21 MED ORDER — HEPARIN SOD (PORK) LOCK FLUSH 100 UNIT/ML IV SOLN
500.0000 [IU] | Freq: Once | INTRAVENOUS | Status: DC | PRN
  Filled 2011-09-21: qty 5

## 2011-09-21 MED ORDER — SODIUM CHLORIDE 0.9 % IJ SOLN
10.0000 mL | INTRAMUSCULAR | Status: DC | PRN
  Filled 2011-09-21: qty 10

## 2011-09-21 MED ORDER — SODIUM CHLORIDE 0.9 % IV SOLN
Freq: Once | INTRAVENOUS | Status: AC
  Administered 2011-09-21: 13:00:00 via INTRAVENOUS

## 2011-09-21 NOTE — Patient Instructions (Addendum)
[facility] Discharge Instructions for Patients Receiving Chemotherapy  Today you received the following chemotherapy agents Herceptin.   If you develop nausea and vomiting that is not controlled by your nausea medication, call the clinic. If it is after clinic hours your family physician or the after hours number for the clinic or go to the Emergency Department.   BELOW ARE SYMPTOMS THAT SHOULD BE REPORTED IMMEDIATELY:  *FEVER GREATER THAN 100.5 F  *CHILLS WITH OR WITHOUT FEVER  NAUSEA AND VOMITING THAT IS NOT CONTROLLED WITH YOUR NAUSEA MEDICATION  *UNUSUAL SHORTNESS OF BREATH  *UNUSUAL BRUISING OR BLEEDING  TENDERNESS IN MOUTH AND THROAT WITH OR WITHOUT PRESENCE OF ULCERS  *URINARY PROBLEMS  *BOWEL PROBLEMS  UNUSUAL RASH Items with * indicate a potential emergency and should be followed up as soon as possible.  One of the nurses will contact you 24 hours after your treatment. Please let the nurse know about any problems that you may have experienced. Feel free to call the clinic you have any questions or concerns. The clinic phone number is [phone].   I have been informed and understand all the instructions given to me. I know to contact the clinic, my physician, or go to the Emergency Department if any problems should occur. I do not have any questions at this time, but understand that I may call the clinic during office hours   should I have any questions or need assistance in obtaining follow up care.    __________________________________________  _____________  __________ Signature of Patient or Authorized Representative            Date                   Time    __________________________________________ Nurse's Signature   

## 2011-09-25 ENCOUNTER — Ambulatory Visit: Payer: BC Managed Care – PPO | Admitting: Physical Therapy

## 2011-09-28 ENCOUNTER — Ambulatory Visit: Payer: BC Managed Care – PPO | Admitting: Physical Therapy

## 2011-10-05 ENCOUNTER — Telehealth: Payer: Self-pay | Admitting: *Deleted

## 2011-10-05 NOTE — Telephone Encounter (Signed)
Per staff message I have scheudled appts.  JMW  

## 2011-10-12 ENCOUNTER — Ambulatory Visit: Payer: BC Managed Care – PPO

## 2011-10-13 ENCOUNTER — Other Ambulatory Visit (HOSPITAL_BASED_OUTPATIENT_CLINIC_OR_DEPARTMENT_OTHER): Payer: BC Managed Care – PPO | Admitting: Lab

## 2011-10-13 ENCOUNTER — Ambulatory Visit (HOSPITAL_BASED_OUTPATIENT_CLINIC_OR_DEPARTMENT_OTHER): Payer: BC Managed Care – PPO

## 2011-10-13 ENCOUNTER — Other Ambulatory Visit: Payer: Self-pay | Admitting: Oncology

## 2011-10-13 VITALS — BP 98/75 | HR 85 | Temp 98.1°F

## 2011-10-13 DIAGNOSIS — C50919 Malignant neoplasm of unspecified site of unspecified female breast: Secondary | ICD-10-CM

## 2011-10-13 DIAGNOSIS — Z5112 Encounter for antineoplastic immunotherapy: Secondary | ICD-10-CM

## 2011-10-13 DIAGNOSIS — N63 Unspecified lump in unspecified breast: Secondary | ICD-10-CM

## 2011-10-13 DIAGNOSIS — C50319 Malignant neoplasm of lower-inner quadrant of unspecified female breast: Secondary | ICD-10-CM

## 2011-10-13 LAB — CBC WITH DIFFERENTIAL/PLATELET
BASO%: 0.3 % (ref 0.0–2.0)
Basophils Absolute: 0 10*3/uL (ref 0.0–0.1)
EOS%: 1.3 % (ref 0.0–7.0)
Eosinophils Absolute: 0.1 10*3/uL (ref 0.0–0.5)
HCT: 38.3 % (ref 34.8–46.6)
HGB: 12.9 g/dL (ref 11.6–15.9)
LYMPH%: 38.5 % (ref 14.0–49.7)
MCH: 31.5 pg (ref 25.1–34.0)
MCHC: 33.7 g/dL (ref 31.5–36.0)
MCV: 93.4 fL (ref 79.5–101.0)
MONO#: 0.2 10*3/uL (ref 0.1–0.9)
MONO%: 4.8 % (ref 0.0–14.0)
NEUT#: 2.5 10*3/uL (ref 1.5–6.5)
NEUT%: 55.1 % (ref 38.4–76.8)
Platelets: 166 10*3/uL (ref 145–400)
RBC: 4.1 10*6/uL (ref 3.70–5.45)
RDW: 13 % (ref 11.2–14.5)
WBC: 4.5 10*3/uL (ref 3.9–10.3)
lymph#: 1.7 10*3/uL (ref 0.9–3.3)

## 2011-10-13 MED ORDER — SODIUM CHLORIDE 0.9 % IJ SOLN
10.0000 mL | INTRAMUSCULAR | Status: DC | PRN
  Administered 2011-10-13: 10 mL
  Filled 2011-10-13: qty 10

## 2011-10-13 MED ORDER — DIPHENHYDRAMINE HCL 25 MG PO CAPS: 25.0000 mg | ORAL_CAPSULE | Freq: Once | ORAL | Status: DC

## 2011-10-13 MED ORDER — ACETAMINOPHEN 325 MG PO TABS: 650.0000 mg | ORAL_TABLET | Freq: Once | ORAL | Status: DC

## 2011-10-13 MED ORDER — SODIUM CHLORIDE 0.9 % IV SOLN
Freq: Once | INTRAVENOUS | Status: AC
  Administered 2011-10-13: 13:00:00 via INTRAVENOUS

## 2011-10-13 MED ORDER — TRASTUZUMAB CHEMO INJECTION 440 MG
6.0000 mg/kg | Freq: Once | INTRAVENOUS | Status: AC
  Administered 2011-10-13: 357 mg via INTRAVENOUS
  Filled 2011-10-13: qty 17

## 2011-10-13 MED ORDER — HEPARIN SOD (PORK) LOCK FLUSH 100 UNIT/ML IV SOLN
500.0000 [IU] | Freq: Once | INTRAVENOUS | Status: AC | PRN
  Administered 2011-10-13: 500 [IU]
  Filled 2011-10-13: qty 5

## 2011-10-13 NOTE — Patient Instructions (Signed)
[facility] Discharge Instructions for Patients Receiving Chemotherapy  Today you received the following chemotherapy agents Herceptin To help prevent nausea and vomiting after your treatment, we encourage you to take your nausea medication as prescribed.  If you develop nausea and vomiting that is not controlled by your nausea medication, call the clinic. If it is after clinic hours your family physician or the after hours number for the clinic or go to the Emergency Department.   BELOW ARE SYMPTOMS THAT SHOULD BE REPORTED IMMEDIATELY:  *FEVER GREATER THAN 100.5 F  *CHILLS WITH OR WITHOUT FEVER  NAUSEA AND VOMITING THAT IS NOT CONTROLLED WITH YOUR NAUSEA MEDICATION  *UNUSUAL SHORTNESS OF BREATH  *UNUSUAL BRUISING OR BLEEDING  TENDERNESS IN MOUTH AND THROAT WITH OR WITHOUT PRESENCE OF ULCERS  *URINARY PROBLEMS  *BOWEL PROBLEMS  UNUSUAL RASH Items with * indicate a potential emergency and should be followed up as soon as possible.  One of the nurses will contact you 24 hours after your treatment. Please let the nurse know about any problems that you may have experienced. Feel free to call the clinic you have any questions or concerns. The clinic phone number is [phone].   I have been informed and understand all the instructions given to me. I know to contact the clinic, my physician, or go to the Emergency Department if any problems should occur. I do not have any questions at this time, but understand that I may call the clinic during office hours   should I have any questions or need assistance in obtaining follow up care.    __________________________________________  _____________  __________ Signature of Patient or Authorized Representative            Date                   Time    __________________________________________ Nurse's Signature    

## 2011-10-23 ENCOUNTER — Encounter: Payer: BC Managed Care – PPO | Admitting: Physical Therapy

## 2011-10-26 ENCOUNTER — Other Ambulatory Visit: Payer: Self-pay | Admitting: Obstetrics

## 2011-10-26 DIAGNOSIS — C50919 Malignant neoplasm of unspecified site of unspecified female breast: Secondary | ICD-10-CM

## 2011-11-01 ENCOUNTER — Other Ambulatory Visit: Payer: Self-pay | Admitting: Oncology

## 2011-11-01 DIAGNOSIS — N63 Unspecified lump in unspecified breast: Secondary | ICD-10-CM

## 2011-11-01 DIAGNOSIS — C50919 Malignant neoplasm of unspecified site of unspecified female breast: Secondary | ICD-10-CM

## 2011-11-02 ENCOUNTER — Ambulatory Visit (HOSPITAL_BASED_OUTPATIENT_CLINIC_OR_DEPARTMENT_OTHER): Payer: BC Managed Care – PPO | Admitting: Oncology

## 2011-11-02 ENCOUNTER — Other Ambulatory Visit (HOSPITAL_BASED_OUTPATIENT_CLINIC_OR_DEPARTMENT_OTHER): Payer: BC Managed Care – PPO | Admitting: Lab

## 2011-11-02 ENCOUNTER — Ambulatory Visit (HOSPITAL_BASED_OUTPATIENT_CLINIC_OR_DEPARTMENT_OTHER): Payer: BC Managed Care – PPO

## 2011-11-02 ENCOUNTER — Other Ambulatory Visit: Payer: Self-pay | Admitting: Physician Assistant

## 2011-11-02 VITALS — BP 92/61 | HR 74 | Temp 98.5°F | Ht 60.0 in | Wt 126.4 lb

## 2011-11-02 DIAGNOSIS — C50319 Malignant neoplasm of lower-inner quadrant of unspecified female breast: Secondary | ICD-10-CM

## 2011-11-02 DIAGNOSIS — C50919 Malignant neoplasm of unspecified site of unspecified female breast: Secondary | ICD-10-CM

## 2011-11-02 DIAGNOSIS — Z5112 Encounter for antineoplastic immunotherapy: Secondary | ICD-10-CM

## 2011-11-02 DIAGNOSIS — N63 Unspecified lump in unspecified breast: Secondary | ICD-10-CM

## 2011-11-02 LAB — CBC WITH DIFFERENTIAL/PLATELET
BASO%: 0.3 % (ref 0.0–2.0)
Basophils Absolute: 0 10*3/uL (ref 0.0–0.1)
EOS%: 0.6 % (ref 0.0–7.0)
Eosinophils Absolute: 0 10*3/uL (ref 0.0–0.5)
HCT: 37.4 % (ref 34.8–46.6)
HGB: 12.8 g/dL (ref 11.6–15.9)
LYMPH%: 34.1 % (ref 14.0–49.7)
MCH: 32 pg (ref 25.1–34.0)
MCHC: 34.3 g/dL (ref 31.5–36.0)
MCV: 93.4 fL (ref 79.5–101.0)
MONO#: 0.3 10*3/uL (ref 0.1–0.9)
MONO%: 6 % (ref 0.0–14.0)
NEUT#: 2.9 10*3/uL (ref 1.5–6.5)
NEUT%: 59 % (ref 38.4–76.8)
Platelets: 182 10*3/uL (ref 145–400)
RBC: 4.01 10*6/uL (ref 3.70–5.45)
RDW: 13.3 % (ref 11.2–14.5)
WBC: 5 10*3/uL (ref 3.9–10.3)
lymph#: 1.7 10*3/uL (ref 0.9–3.3)

## 2011-11-02 LAB — COMPREHENSIVE METABOLIC PANEL
ALT: 15 U/L (ref 0–35)
AST: 17 U/L (ref 0–37)
Albumin: 4.1 g/dL (ref 3.5–5.2)
Alkaline Phosphatase: 57 U/L (ref 39–117)
BUN: 16 mg/dL (ref 6–23)
CO2: 29 mEq/L (ref 19–32)
Calcium: 9.2 mg/dL (ref 8.4–10.5)
Chloride: 104 mEq/L (ref 96–112)
Creatinine, Ser: 0.79 mg/dL (ref 0.50–1.10)
Glucose, Bld: 79 mg/dL (ref 70–99)
Potassium: 3.7 mEq/L (ref 3.5–5.3)
Sodium: 139 mEq/L (ref 135–145)
Total Bilirubin: 0.5 mg/dL (ref 0.3–1.2)
Total Protein: 6.2 g/dL (ref 6.0–8.3)

## 2011-11-02 MED ORDER — LORAZEPAM 1 MG PO TABS: 0.5000 mg | ORAL_TABLET | Freq: Every evening | ORAL | Status: DC | PRN

## 2011-11-02 MED ORDER — SODIUM CHLORIDE 0.9 % IJ SOLN
10.0000 mL | INTRAMUSCULAR | Status: DC | PRN
  Administered 2011-11-02: 10 mL
  Filled 2011-11-02: qty 10

## 2011-11-02 MED ORDER — DIPHENHYDRAMINE HCL 25 MG PO CAPS: 25.0000 mg | ORAL_CAPSULE | Freq: Once | ORAL | Status: DC

## 2011-11-02 MED ORDER — HEPARIN SOD (PORK) LOCK FLUSH 100 UNIT/ML IV SOLN
500.0000 [IU] | Freq: Once | INTRAVENOUS | Status: AC | PRN
  Administered 2011-11-02: 500 [IU]
  Filled 2011-11-02: qty 5

## 2011-11-02 MED ORDER — TOBRAMYCIN-DEXAMETHASONE 0.3-0.1 % OP SUSP: 1.0000 [drp] | Freq: Once | OPHTHALMIC | Status: DC

## 2011-11-02 MED ORDER — TRASTUZUMAB CHEMO INJECTION 440 MG
6.0000 mg/kg | Freq: Once | INTRAVENOUS | Status: AC
  Administered 2011-11-02: 357 mg via INTRAVENOUS
  Filled 2011-11-02: qty 17

## 2011-11-02 MED ORDER — ACETAMINOPHEN 325 MG PO TABS: 650.0000 mg | ORAL_TABLET | Freq: Once | ORAL | Status: DC

## 2011-11-02 MED ORDER — SODIUM CHLORIDE 0.9 % IV SOLN
Freq: Once | INTRAVENOUS | Status: AC
  Administered 2011-11-02: 16:00:00 via INTRAVENOUS

## 2011-11-02 MED ORDER — DOXYCYCLINE HYCLATE 100 MG PO CAPS: 100.0000 mg | ORAL_CAPSULE | Freq: Two times a day (BID) | ORAL | Status: AC

## 2011-11-02 NOTE — Patient Instructions (Addendum)
[facility] Discharge Instructions for Patients Receiving Chemotherapy  Today you received the following chemotherapy agents herceptin     If you develop nausea and vomiting that is not controlled by your nausea medication, call the clinic. If it is after clinic hours your family physician or the after hours number for the clinic or go to the Emergency Department.   BELOW ARE SYMPTOMS THAT SHOULD BE REPORTED IMMEDIATELY:  *FEVER GREATER THAN 100.5 F  *CHILLS WITH OR WITHOUT FEVER  NAUSEA AND VOMITING THAT IS NOT CONTROLLED WITH YOUR NAUSEA MEDICATION  *UNUSUAL SHORTNESS OF BREATH  *UNUSUAL BRUISING OR BLEEDING  TENDERNESS IN MOUTH AND THROAT WITH OR WITHOUT PRESENCE OF ULCERS  *URINARY PROBLEMS  *BOWEL PROBLEMS  UNUSUAL RASH Items with * indicate a potential emergency and should be followed up as soon as possible.  he clinic phone number is [phone].   I have been informed and understand all the instructions given to me. I know to contact the clinic, my physician, or go to the Emergency Department if any problems should occur. I do not have any questions at this time, but understand that I may call the clinic during office hours   should I have any questions or need assistance in obtaining follow up care.    __________________________________________  _____________  __________ Signature of Patient or Authorized Representative            Date                   Time    __________________________________________ Nurse's Signature

## 2011-11-02 NOTE — Progress Notes (Signed)
ID: [name]   DOB: [birth year]  MR#: [id number]  WUJ#:[id number]  HISTORY OF PRESENT ILLNESS: [name] (goes by "[name]") is a [place] woman who, at the age of 41, was referred by Dr. Coral [name] for treatment of right breast carcinoma.  The patient palpated a mass in her right breast which she brought to Dr. Verdell [name] attention. A prior screening mammogram on 02/06/2010 had been unremarkable. The patient was then scheduled for bilateral diagnostic mammogram and right breast ultrasonography on 12/22/2010, the studies demonstrating an ill defined mass with heterogeneous calcifications in the inner lower right breast, measuring 1.7 cm. The breasts are heterogeneously dense, but there were no other mammographic abnormalities in either breast. Clinically, there was a palpable firm mass at the 5:00 position of the right breast, 5 cm from the nipple. There were no palpable abnormalities in the right axilla. Ultrasound confirmed a 1.7 cm irregular, hypoechoic mass in the same position. There were some lymph nodes noted in the right exam of, one measuring 1.6 cm with a minimally thickened cortex inferiorly.  An ultrasound-guided core biopsy on 12/23/2010 [phone]) showed an invasive ductal carcinoma, grade 2, ER positive at 88%, PR positive at 11 is %, HER-2/neu positive with a FISH ratio of 4.61, and an MIB-1 of 36%.  The patient was referred to Dr. Claud [name] and bilateral breast MRIs were obtained 12/30/2010. This showed the mass in question in the right breast to measure 2.0 cm. In the right axilla there was a 1.8 cm lymph node with a thickened cortex. The left breast was unremarkable. With this information she proceeded to neoadjuvant treatment and definitive surgery as detailed below   INTERVAL HISTORY: [name] for followup of her breast cancer. The interval history is generally unremarkable. She is back to work, meeting 1 hour each way. She doesn't have much  leftover for exercise, but she tries to walk on weekends.   REVIEW OF SYSTEMS:  She still has aches in her legs, both anteriorly and posteriorly, as well as her knees. She feels still somewhat tired. Hot flashes are mild. She is having some vaginal wetness, which is not a major issue for her. Again she has not had a period since December of 2012. Sometimes though she feels a little bit bloated, as if she were having a period. Bowel movements are little looser, possibly because she has changed her diet and is eating more fiber as well as more protein. She is having some hemorrhoid issues. She has occasional headaches, feels anxious and forgetful. Her eyes or dry. Otherwise a detailed review of systems was noncontributory   PAST MEDICAL HISTORY: Past Medical History  Diagnosis Date  . Migraines   . Asthma     as a child  . Breast cancer Sept 2012    right breast  . PONV (postoperative nausea and vomiting)   . Rash     neck  1. History of prior adenoidectomy. 2. History of rhinoplasty.   3. History of multiple uterine cervical treatments for precancerous lesions, none for the past 10 years or so. 4. History of fibroid tumor surgery. 5. History of D and C. 6. History of dysplastic nevi removed by Dr. Yetta [name]. 7. History of right "lazy eye" corrective surgery. 8. History of approximately 10-pack-years of tobacco abuse, resolved.  9. History of possible migraines (morning headaches). History of asthma as a child.  PAST SURGICAL HISTORY: Past Surgical History  Procedure Date  . Rhinoplasty   . Adenoidectomy   .  Fibroid tumor surgery   . Dilation and curettage of uterus   . Uterine cervical treatments 2002    for precancerous lesions  . Lazy eye     corrective surgery  . Mastectomy w/ sentinel node biopsy 06/29/2011    Procedure: MASTECTOMY WITH SENTINEL LYMPH NODE BIOPSY;  Surgeon: Judith Mention, MD;  Location: [facility] OR;  Service: General;  Laterality: Right;  right skin spairing  mstectomy and right sentinel lymph node biopsy  . Breast reconstruction 06/29/2011    Procedure: BREAST RECONSTRUCTION;  Surgeon: Judith Denis, DO;  Location: [facility] OR;  Service: Plastics;  Laterality: Bilateral;  Immediate Bilateral Breast Reconstruction with Bilateral Tissue Expanders and placement of  Alloderm     FAMILY HISTORY Family History  Problem Relation Age of Onset  . Adopted: Yes  . Heart attack Father   . Cancer Brother     prostate and lung  . Cancer Cousin     melanoma  . Cancer Cousin     breast  :  The patient's biological father died at the age of 22 from myocardial infarction.  The patient's biological mother is alive at age 51.  The patient was adopted by her biological mother's parents.  She has 3 half-brothers.  No sisters.  One cousin on her mother's side died from melanoma at the age of 31.  There is other cancer history in the adopted side but these are not even half-brothers or sisters, they are children of her adopted parents who are really her grandparents so these would be uncles and aunts (1 lymph node cancer, 1 prostate cancer, 1 breast cancer at the age of 51).  GYNECOLOGIC HISTORY: She had menarche age 41 or 41.  She is still having periods, the most recent 1 being September 13.  She is to have an IUD in place.  Currently is still using oral contraceptives and she has been advised to discontinue this.  She is GX, P1, first pregnancy to term at age 41.  SOCIAL HISTORY: She works in a Agricultural consultant.  Her husband, [name] owns a family business called [employer].  They have been married 5 years.  They have a son, [name], age 30.  [name] also has a daughter, [name], who lives in [place] and is 42 years old.  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  The patient is not a church attender.    ADVANCED DIRECTIVES: Not in place  HEALTH MAINTENANCE: History  Substance Use Topics  . Smoking status: Former Smoker    Quit date: 07/20/1996  .  Smokeless tobacco: Never Used  . Alcohol Use: No     Colonoscopy:  PAP: [name]  Bone density:  Lipid panel:  Allergies  Allergen Reactions  . Tussionex Pennkinetic Er (Hydrocod Polst-Cpm Polst Er) Other (See Comments)    halucinations  . Codeine Other (See Comments)    unkown  . Morphine And Related Anxiety    anxiety  . Penicillins Rash    Current Outpatient Prescriptions  Medication Sig Dispense Refill  . b complex vitamins tablet Take 1 tablet by mouth daily.      [name] BIOTIN PO Take 10,000 mcg by mouth daily.      . Calcium Carbonate-Vitamin D (CALCIUM-VITAMIN D) 500-200 MG-UNIT per tablet Take 1 tablet by mouth 2 (two) times daily with a meal.      . fish oil-omega-3 fatty acids 1000 MG capsule Take 2 g by mouth daily.      [name]  gabapentin (NEURONTIN) 100 MG capsule       . loratadine (CLARITIN) 10 MG tablet Take 10 mg by mouth daily.       [name] LORazepam (ATIVAN) 1 MG tablet TAKE ONE-HALF TABLET BY MOUTH TWICE DAILY AS NEEDED FOR ANXIETY  30 tablet  0  . prenatal vitamin w/FE, FA (PRENATAL 1 + 1) 27-1 MG TABS Take 1 tablet by mouth Daily.      . tamoxifen (NOLVADEX) 20 MG tablet TAKE ONE TABLET BY MOUTH EVERY DAY  30 tablet  2  . tobramycin-dexamethasone (TOBRADEX) ophthalmic solution Place 1 drop into both eyes Twice daily. As needed        OBJECTIVE: A burly middle-aged white woman i who was tearful during her visit today  Filed Vitals:   11/02/11 1412  BP: 92/61  Pulse: 74  Temp: 98.5 F (36.9 C)     Body mass index is 24.69 kg/(m^2).    ECOG FS: 0  Sclerae unicteric Oropharynx clear No cervical supraclavicular or axillary adenopathy, s Lungs no rales or rhonchi Heart regular rate and rhythm Abd benign MSK no focal spinal tenderness, minimal right upper extremity edema, noticeable only below the elbow.  Neuro: nonfocal Breasts: She is status post bilateral mastectomies with bilateral implants in place.  there is no evidence of recurrence.  LAB RESULTS: Lab  Results  Component Value Date   WBC 5.0 11/02/2011   NEUTROABS 2.9 11/02/2011   HGB 12.8 11/02/2011   HCT 37.4 11/02/2011   MCV 93.4 11/02/2011   PLT 182 11/02/2011      Chemistry      Component Value Date/Time   NA 139 08/31/2011 1244   K 4.1 08/31/2011 1244   CL 103 08/31/2011 1244   CO2 29 08/31/2011 1244   BUN 10 08/31/2011 1244   CREATININE 0.79 08/31/2011 1244      Component Value Date/Time   CALCIUM 9.4 08/31/2011 1244   ALKPHOS 87 08/31/2011 1244   AST 18 08/31/2011 1244   ALT 15 08/31/2011 1244   BILITOT 0.3 08/31/2011 1244       No results found for this basename: INR:1;PROTIME:1 in the last 168 hours  No results found for this basename: UACOL:1,UAPR:1,USPG:1,UPH:1,UTP:1,UGL:1,UKET:1,UBIL:1,UHGB:1,UNIT:1,UROB:1,ULEU:1,UEPI:1,UWBC:1,URBC:1,UBAC:1,CAST:1,CRYS:1,UCOM:1,BILUA:1 in the last 72 hours   STUDIES: Echocardiogram 09/18/2011 showed a well preserved ejection fraction.  ASSESSMENT: 41 y.o.  BRCA 1-2 negative [name], [place] woman,   (1) status post right breast biopsy in September 2012 for a clinically 2 cm invasive ductal carcinoma, with some enlarged Right axillary lymph nodes, the largest one of which was negative by biopsy. The tumor was grade 2, estrogen receptor positive at 88%, progesterone receptor positive at 11%, HER-2/neu positive by CISH with a ratio of 4.61, and a proliferation marker of 36%.   (2) treated in the neoadjuvant setting with Q 3 week docetaxel/ carboplatin/ trastuzumab x6, completed 05/18/2011  (3) s/p bilateral mastectomies with sentinel node biopsy 06/28/1969 for a residual right-sided ypT1c ypN0 invasive ductal carcinoma, grade 2, with immediate expander placement  (4) on tamoxifen as of May 2013   PLAN: She is tolerating tamoxifen well, and the plan is to continue that for a minimum of 2 years. We will obtain [facility] and estradiol levels in December to assess for menopause. In the interim she has been advised to continue to use barrier  contraceptives. Martie Lee is feeling a great deal of stress still because of her diagnosis, although she was reassured that her prognosis is good. She has a transvaginal ultrasound scheduled through  Dr. Clearance Coots for later this month, and a visit with dermatology in late August to look at her moles. She also has an appointment with ophthalmology, and in the meantime she requested a refill on her TobraDex. She is aware of concerns regarding increasing intraocular pressure with continued use of that medication. She is going to see [place] again in 3 months. She knows to call for any problems that may develop before that  Makailah Slavick C    11/02/2011

## 2011-11-07 ENCOUNTER — Ambulatory Visit (HOSPITAL_COMMUNITY)
Admission: RE | Admit: 2011-11-07 | Discharge: 2011-11-07 | Disposition: A | Discharge status: Home / Self Care | Payer: BC Managed Care – PPO | Source: Ambulatory Visit | Attending: Obstetrics | Admitting: Obstetrics

## 2011-11-07 DIAGNOSIS — C50919 Malignant neoplasm of unspecified site of unspecified female breast: Secondary | ICD-10-CM | POA: Insufficient documentation

## 2011-11-08 ENCOUNTER — Telehealth (INDEPENDENT_AMBULATORY_CARE_PROVIDER_SITE_OTHER): Payer: Self-pay | Admitting: General Surgery

## 2011-11-08 ENCOUNTER — Telehealth: Payer: Self-pay | Admitting: *Deleted

## 2011-11-08 NOTE — Telephone Encounter (Signed)
This RN spoke with pt per her call regarding getting port removed with reconstruction scheduled 11/23/2011.  [name] is still receiving IV herceptin with 4 therapies remaining.  Per phone call discussion this RN informed pt above is her decision but also discussed need for IV access for remaining treatments- Note ideally RN will only use her L arm and pt may require multiple IV sticks with each therapy.  [name] states she is concerned about getting port removed due to " another surgery and I want to get it out before the end of the year because I have met my deductible ".  Chart reviewed and pt will have last IV herceptin in November. Informed [name] port can be removed ASAP post last herceptin ( to stay within 1 year for deductible ) as well as removal is not under anestheia like her reconstruction.  At present [name] will keep port - per above plan.  This RN did call the above to [facility] at Dr Jacinto Halim office.

## 2011-11-08 NOTE — Telephone Encounter (Signed)
Patient called in stating that she saw Dr. Kelly Splinter yesterday and was there to discuss the removal of the tissue expanders that were inserted. Patient asked if the port-a-cath could be removed. Patient not sure if that is something Dr. Kelly Splinter would do, or Dr. Derrell Lolling. Patient wanted clarification since she is almost finished with treatment as well. Advised patient message would be forwarded to Dr. Derrell Lolling and that he might talk to Dr. Kelly Splinter directly, or want to review her notes to determine what would be the most effective option for her. I advised her that once a decision has been made she will be contacted by myself, Dr. Derrell Lolling or Dr. Kelly Splinter and advised. Patient agreed.

## 2011-11-08 NOTE — Telephone Encounter (Signed)
Called left message on nurse voicemail asking for return call pertaining to this patient request to have port-a-cath removed. Advised in message this patient wanted to know if it could be done at the same time the tissue expanders are removed. Advised that Dr. Derrell Lolling would like to know when her treatment schedule will permit for the port-a-cath to be removed.    Information obtained from Dr. Derrell Lolling in response to prior message sent was as follows (prior to call to Dr. Darrall Dears office):  Patient options: 1) removal under local in office (at [facility]), 2) remove day tissue expanders are removed, 3) Dr. Kelly Splinter can do it.

## 2011-11-10 ENCOUNTER — Other Ambulatory Visit: Payer: Self-pay | Admitting: Oncology

## 2011-11-17 ENCOUNTER — Telehealth (INDEPENDENT_AMBULATORY_CARE_PROVIDER_SITE_OTHER): Payer: Self-pay | Admitting: General Surgery

## 2011-11-17 ENCOUNTER — Other Ambulatory Visit: Payer: Self-pay | Admitting: Plastic Surgery

## 2011-11-17 NOTE — H&P (Signed)
  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 female.  HPI [name] is a 41 y.o. female referred for a history and physical after immediate bilateral breast reconstruction with expander and FlexHD. She was diagnosed with right breast cancer after she palpated a mass. She underwent mammagram, U/S and bx which showed an invasive ductal carcinoma. We are waiting on the reports from the oncologist. She received chemotherapy which ended in February. She reports ER/PR positive and HER-2 positive tumor with BRCA negative markers. She does not have a family history of breast cancer and no previous abnormal mammagrams. She had her first child at 5 yrs of age. She is 5 feet 3 inches and weighs ~125 pounds. She was a 36A or B cup but would like to be a C cup. Her flaps are warm and dry. She started PT. She has 400 cc in the right and 390 cc in the left expander.  The following portions of the patient's history were reviewed and updated as appropriate: allergies, current medications, past family history, past medical history, past social history, past surgical history and problem list.  Review of Systems  Constitutional: Negative.   HENT: Negative.   Eyes: Negative.   Respiratory: Negative.   Cardiovascular: Negative.   Gastrointestinal: Negative.   Genitourinary: Negative.   Musculoskeletal: Negative.   Neurological: Negative.   Hematological: Negative.   Psychiatric/Behavioral: Negative.    Objective:     Physical Exam  Constitutional: She appears well-developed and well-nourished.  HENT:   Head: Normocephalic and atraumatic.  Eyes:  Conjunctivae normal are normal. Pupils are equal, round, and reactive to light.  Cardiovascular: Normal rate.   Pulmonary/Chest: Effort normal.  Abdominal: Soft. She exhibits no distension.  Neurological: She is alert.  Skin: Skin is warm.  Psychiatric: She has a normal mood and affect. Her behavior is normal. Judgment normal.      Assessment:  1.  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  Risks and complications were reviewed and include capsule contracture, infection, bleeding, pain, scar and risk of anesthesia. We added 40 cc of injectable saline to the right expander and 50 cc into the left for a total of 440/350cc in both expanders.    Medications Ordered This Encounter      diazepam (VALIUM) 2 MG tablet 30 tablet Take 1 tablet (2 mg total) by mouth every 12 (twelve) hours as needed for 10 days for Anxiety.     docusate sodium (COLACE) 100 MG capsule 20 Take 1 capsule (100 mg total) by mouth 2 times daily.   cephalexin (KEFLEX) 500 MG capsule 28 Take 1 capsule (500 mg total) by mouth 4 times daily.    HYDROcodone-acetaminophen (NORCO) 5-325 mg per tablet 30 Take 1 tablet by mouth every 6 (six) hours as needed for 10 days for Pain.

## 2011-11-17 NOTE — Telephone Encounter (Signed)
Called patient back based on message left. Advised patient that based on her prior conversation with oncology that she continue and complete treatment first and then the port can be removed. The port being removed would not be considered until November when she has completed treatment. Patient agreed.

## 2011-11-20 NOTE — Progress Notes (Signed)
No labs needed-going home post op

## 2011-11-22 ENCOUNTER — Telehealth (INDEPENDENT_AMBULATORY_CARE_PROVIDER_SITE_OTHER): Payer: Self-pay | Admitting: General Surgery

## 2011-11-22 ENCOUNTER — Other Ambulatory Visit (HOSPITAL_BASED_OUTPATIENT_CLINIC_OR_DEPARTMENT_OTHER): Payer: BC Managed Care – PPO | Admitting: Lab

## 2011-11-22 ENCOUNTER — Ambulatory Visit (HOSPITAL_BASED_OUTPATIENT_CLINIC_OR_DEPARTMENT_OTHER): Payer: BC Managed Care – PPO

## 2011-11-22 VITALS — BP 106/65 | HR 74 | Temp 97.9°F | Resp 18

## 2011-11-22 DIAGNOSIS — C50919 Malignant neoplasm of unspecified site of unspecified female breast: Secondary | ICD-10-CM

## 2011-11-22 DIAGNOSIS — N63 Unspecified lump in unspecified breast: Secondary | ICD-10-CM

## 2011-11-22 DIAGNOSIS — Z5112 Encounter for antineoplastic immunotherapy: Secondary | ICD-10-CM

## 2011-11-22 DIAGNOSIS — C50319 Malignant neoplasm of lower-inner quadrant of unspecified female breast: Secondary | ICD-10-CM

## 2011-11-22 LAB — CBC WITH DIFFERENTIAL/PLATELET
BASO%: 0.3 % (ref 0.0–2.0)
Basophils Absolute: 0 10*3/uL (ref 0.0–0.1)
EOS%: 1.3 % (ref 0.0–7.0)
Eosinophils Absolute: 0.1 10*3/uL (ref 0.0–0.5)
HCT: 36.8 % (ref 34.8–46.6)
HGB: 12.3 g/dL (ref 11.6–15.9)
LYMPH%: 37.3 % (ref 14.0–49.7)
MCH: 31.4 pg (ref 25.1–34.0)
MCHC: 33.3 g/dL (ref 31.5–36.0)
MCV: 94.4 fL (ref 79.5–101.0)
MONO#: 0.3 10*3/uL (ref 0.1–0.9)
MONO%: 6.9 % (ref 0.0–14.0)
NEUT#: 2.7 10*3/uL (ref 1.5–6.5)
NEUT%: 54.2 % (ref 38.4–76.8)
Platelets: 172 10*3/uL (ref 145–400)
RBC: 3.9 10*6/uL (ref 3.70–5.45)
RDW: 13.3 % (ref 11.2–14.5)
WBC: 4.9 10*3/uL (ref 3.9–10.3)
lymph#: 1.8 10*3/uL (ref 0.9–3.3)

## 2011-11-22 MED ORDER — DIPHENHYDRAMINE HCL 25 MG PO CAPS: 25.0000 mg | ORAL_CAPSULE | Freq: Once | ORAL | Status: DC

## 2011-11-22 MED ORDER — SODIUM CHLORIDE 0.9 % IV SOLN
Freq: Once | INTRAVENOUS | Status: AC
  Administered 2011-11-22: 14:00:00 via INTRAVENOUS

## 2011-11-22 MED ORDER — ACETAMINOPHEN 325 MG PO TABS: 650.0000 mg | ORAL_TABLET | Freq: Once | ORAL | Status: DC

## 2011-11-22 MED ORDER — TRASTUZUMAB CHEMO INJECTION 440 MG
6.0000 mg/kg | Freq: Once | INTRAVENOUS | Status: AC
  Administered 2011-11-22: 357 mg via INTRAVENOUS
  Filled 2011-11-22: qty 17

## 2011-11-22 MED ORDER — HEPARIN SOD (PORK) LOCK FLUSH 100 UNIT/ML IV SOLN
500.0000 [IU] | Freq: Once | INTRAVENOUS | Status: AC | PRN
  Administered 2011-11-22: 500 [IU]
  Filled 2011-11-22: qty 5

## 2011-11-22 MED ORDER — SODIUM CHLORIDE 0.9 % IJ SOLN
10.0000 mL | INTRAMUSCULAR | Status: DC | PRN
  Administered 2011-11-22: 10 mL
  Filled 2011-11-22: qty 10

## 2011-11-22 NOTE — Patient Instructions (Addendum)
[facility] Discharge Instructions for Patients Receiving Chemotherapy  Today you received the following chemotherapy agents herceptin  To help prevent nausea and vomiting after your treatment, we encourage you to take your nausea medication as directed.   If you develop nausea and vomiting that is not controlled by your nausea medication, call the clinic. If it is after clinic hours your family physician or the after hours number for the clinic or go to the Emergency Department.   BELOW ARE SYMPTOMS THAT SHOULD BE REPORTED IMMEDIATELY:  *FEVER GREATER THAN 100.5 F  *CHILLS WITH OR WITHOUT FEVER  NAUSEA AND VOMITING THAT IS NOT CONTROLLED WITH YOUR NAUSEA MEDICATION  *UNUSUAL SHORTNESS OF BREATH  *UNUSUAL BRUISING OR BLEEDING  TENDERNESS IN MOUTH AND THROAT WITH OR WITHOUT PRESENCE OF ULCERS  *URINARY PROBLEMS  *BOWEL PROBLEMS  UNUSUAL RASH Items with * indicate a potential emergency and should be followed up as soon as possible.  One of the nurses will contact you 24 hours after your treatment. Please let the nurse know about any problems that you may have experienced. Feel free to call the clinic you have any questions or concerns. The clinic phone number is [phone].   I have been informed and understand all the instructions given to me. I know to contact the clinic, my physician, or go to the Emergency Department if any problems should occur. I do not have any questions at this time, but understand that I may call the clinic during office hours   should I have any questions or need assistance in obtaining follow up care.    __________________________________________  _____________  __________ Signature of Patient or Authorized Representative            Date                   Time    __________________________________________ Nurse's Signature    

## 2011-11-22 NOTE — Telephone Encounter (Signed)
Pt called to ask what was the measured size of the tumor that was removed.  She remembered it was originally 2 cm, but was shrunken by treatment before the excision.  Per notes, the tumor was 1.2 cm when it was removed, and pt was updated.

## 2011-11-23 ENCOUNTER — Ambulatory Visit (HOSPITAL_BASED_OUTPATIENT_CLINIC_OR_DEPARTMENT_OTHER)
Admission: RE | Admit: 2011-11-23 | Discharge: 2011-11-23 | Disposition: A | Discharge status: Home / Self Care | Payer: BC Managed Care – PPO | Source: Ambulatory Visit | Attending: Plastic Surgery | Admitting: Plastic Surgery

## 2011-11-23 ENCOUNTER — Other Ambulatory Visit: Payer: BC Managed Care – PPO | Admitting: Lab

## 2011-11-23 ENCOUNTER — Encounter (HOSPITAL_BASED_OUTPATIENT_CLINIC_OR_DEPARTMENT_OTHER): Payer: Self-pay

## 2011-11-23 ENCOUNTER — Ambulatory Visit: Payer: BC Managed Care – PPO

## 2011-11-23 ENCOUNTER — Encounter (HOSPITAL_BASED_OUTPATIENT_CLINIC_OR_DEPARTMENT_OTHER)
Admission: RE | Disposition: A | Discharge status: Home / Self Care | Payer: Self-pay | Source: Ambulatory Visit | Attending: Plastic Surgery

## 2011-11-23 ENCOUNTER — Encounter (HOSPITAL_BASED_OUTPATIENT_CLINIC_OR_DEPARTMENT_OTHER): Payer: Self-pay | Admitting: Anesthesiology

## 2011-11-23 ENCOUNTER — Encounter (HOSPITAL_BASED_OUTPATIENT_CLINIC_OR_DEPARTMENT_OTHER): Payer: Self-pay | Admitting: Plastic Surgery

## 2011-11-23 ENCOUNTER — Ambulatory Visit (HOSPITAL_BASED_OUTPATIENT_CLINIC_OR_DEPARTMENT_OTHER): Payer: BC Managed Care – PPO | Admitting: Anesthesiology

## 2011-11-23 DIAGNOSIS — J45909 Unspecified asthma, uncomplicated: Secondary | ICD-10-CM | POA: Insufficient documentation

## 2011-11-23 DIAGNOSIS — Z9013 Acquired absence of bilateral breasts and nipples: Secondary | ICD-10-CM

## 2011-11-23 DIAGNOSIS — Z421 Encounter for breast reconstruction following mastectomy: Secondary | ICD-10-CM | POA: Insufficient documentation

## 2011-11-23 DIAGNOSIS — Z853 Personal history of malignant neoplasm of breast: Secondary | ICD-10-CM | POA: Insufficient documentation

## 2011-11-23 DIAGNOSIS — Z9010 Acquired absence of unspecified breast and nipple: Secondary | ICD-10-CM | POA: Insufficient documentation

## 2011-11-23 SURGERY — CAPSULECTOMY, BREAST, WITH REPLACEMENT OF IMPLANT
Anesthesia: General | Site: Breast | Laterality: Bilateral | Wound class: Clean

## 2011-11-23 MED ORDER — ACETAMINOPHEN 10 MG/ML IV SOLN
1000.0000 mg | Freq: Once | INTRAVENOUS | Status: AC
  Administered 2011-11-23: 1000 mg via INTRAVENOUS

## 2011-11-23 MED ORDER — SODIUM CHLORIDE 0.9 % IR SOLN
Status: DC | PRN
  Administered 2011-11-23: 12:00:00

## 2011-11-23 MED ORDER — METOCLOPRAMIDE HCL 5 MG/ML IJ SOLN: 10.0000 mg | Freq: Once | INTRAMUSCULAR | Status: DC | PRN

## 2011-11-23 MED ORDER — FENTANYL CITRATE 0.05 MG/ML IJ SOLN
25.0000 ug | INTRAMUSCULAR | Status: DC | PRN
  Administered 2011-11-23: 25 ug via INTRAVENOUS

## 2011-11-23 MED ORDER — SODIUM CHLORIDE 0.9 % IR SOLN
Status: DC | PRN
  Administered 2011-11-23: 11:00:00

## 2011-11-23 MED ORDER — LIDOCAINE HCL (CARDIAC) 20 MG/ML IV SOLN
INTRAVENOUS | Status: DC | PRN
  Administered 2011-11-23: 50 mg via INTRAVENOUS

## 2011-11-23 MED ORDER — MIDAZOLAM HCL 5 MG/5ML IJ SOLN
INTRAMUSCULAR | Status: DC | PRN
  Administered 2011-11-23: 2 mg via INTRAVENOUS

## 2011-11-23 MED ORDER — DEXAMETHASONE SODIUM PHOSPHATE 4 MG/ML IJ SOLN
INTRAMUSCULAR | Status: DC | PRN
  Administered 2011-11-23: 10 mg via INTRAVENOUS

## 2011-11-23 MED ORDER — CIPROFLOXACIN IN D5W 400 MG/200ML IV SOLN
INTRAVENOUS | Status: DC | PRN
  Administered 2011-11-23: 400 mg via INTRAVENOUS

## 2011-11-23 MED ORDER — CIPROFLOXACIN IN D5W 400 MG/200ML IV SOLN
400.0000 mg | INTRAVENOUS | Status: AC
  Administered 2011-11-23: 400 mg via INTRAVENOUS

## 2011-11-23 MED ORDER — PROPOFOL 10 MG/ML IV EMUL
INTRAVENOUS | Status: DC | PRN
  Administered 2011-11-23: 250 mg via INTRAVENOUS

## 2011-11-23 MED ORDER — FENTANYL CITRATE 0.05 MG/ML IJ SOLN
INTRAMUSCULAR | Status: DC | PRN
  Administered 2011-11-23 (×3): 25 ug via INTRAVENOUS

## 2011-11-23 MED ORDER — ONDANSETRON HCL 4 MG/2ML IJ SOLN
INTRAMUSCULAR | Status: DC | PRN
  Administered 2011-11-23: 4 mg via INTRAVENOUS

## 2011-11-23 MED ORDER — LACTATED RINGERS IV SOLN
INTRAVENOUS | Status: DC
  Administered 2011-11-23 (×3): via INTRAVENOUS

## 2011-11-23 MED ORDER — OXYCODONE HCL 5 MG PO TABS
5.0000 mg | ORAL_TABLET | Freq: Once | ORAL | Status: AC | PRN
  Administered 2011-11-23: 5 mg via ORAL

## 2011-11-23 MED ORDER — SCOPOLAMINE 1 MG/3DAYS TD PT72
1.0000 | MEDICATED_PATCH | Freq: Once | TRANSDERMAL | Status: DC
  Administered 2011-11-23: 1.5 mg via TRANSDERMAL

## 2011-11-23 SURGICAL SUPPLY — 61 items
ADH SKN CLS APL DERMABOND .7 (GAUZE/BANDAGES/DRESSINGS) ×2
BAG DECANTER FOR FLEXI CONT (MISCELLANEOUS) ×2 IMPLANT
BANDAGE GAUZE ELAST BULKY 4 IN (GAUZE/BANDAGES/DRESSINGS) ×4 IMPLANT
BINDER BREAST LRG (GAUZE/BANDAGES/DRESSINGS) IMPLANT
BINDER BREAST MEDIUM (GAUZE/BANDAGES/DRESSINGS) ×1 IMPLANT
BINDER BREAST XLRG (GAUZE/BANDAGES/DRESSINGS) IMPLANT
BINDER BREAST XXLRG (GAUZE/BANDAGES/DRESSINGS) IMPLANT
BLADE HEX COATED 2.75 (ELECTRODE) ×2 IMPLANT
BLADE SURG 15 STRL LF DISP TIS (BLADE) ×1 IMPLANT
BLADE SURG 15 STRL SS (BLADE) ×2
CANISTER SUCTION 1200CC (MISCELLANEOUS) ×2 IMPLANT
CHLORAPREP W/TINT 26ML (MISCELLANEOUS) ×2 IMPLANT
CLOTH BEACON ORANGE TIMEOUT ST (SAFETY) ×2 IMPLANT
CORDS BIPOLAR (ELECTRODE) IMPLANT
COVER MAYO STAND STRL (DRAPES) ×2 IMPLANT
COVER TABLE BACK 60X90 (DRAPES) ×2 IMPLANT
DECANTER SPIKE VIAL GLASS SM (MISCELLANEOUS) IMPLANT
DERMABOND ADVANCED (GAUZE/BANDAGES/DRESSINGS) ×2
DERMABOND ADVANCED .7 DNX12 (GAUZE/BANDAGES/DRESSINGS) ×2 IMPLANT
DRAIN CHANNEL 19F RND (DRAIN) IMPLANT
DRAPE LAPAROSCOPIC ABDOMINAL (DRAPES) ×2 IMPLANT
DRSG PAD ABDOMINAL 8X10 ST (GAUZE/BANDAGES/DRESSINGS) ×4 IMPLANT
ELECT BLADE 4.0 EZ CLEAN MEGAD (MISCELLANEOUS) ×2
ELECT REM PT RETURN 9FT ADLT (ELECTROSURGICAL) ×2
ELECTRODE BLDE 4.0 EZ CLN MEGD (MISCELLANEOUS) ×1 IMPLANT
ELECTRODE REM PT RTRN 9FT ADLT (ELECTROSURGICAL) ×1 IMPLANT
EVACUATOR SILICONE 100CC (DRAIN) IMPLANT
GAUZE SPONGE 4X4 12PLY STRL LF (GAUZE/BANDAGES/DRESSINGS) IMPLANT
GLOVE BIO SURGEON STRL SZ 6.5 (GLOVE) ×4 IMPLANT
GLOVE SKINSENSE NS SZ6.5 (GLOVE)
GLOVE SKINSENSE STRL SZ6.5 (GLOVE) IMPLANT
GOWN PREVENTION PLUS XLARGE (GOWN DISPOSABLE) ×5 IMPLANT
IMPL GEL HI PROFILE 375CC (Breast) IMPLANT
IMPLANT GEL HI PROFILE 375CC (Breast) ×4 IMPLANT
IV NS 1000ML (IV SOLUTION)
IV NS 1000ML BAXH (IV SOLUTION) IMPLANT
IV NS 500ML (IV SOLUTION)
IV NS 500ML BAXH (IV SOLUTION) ×1 IMPLANT
KIT FILL MCGHAN 30CC (MISCELLANEOUS) IMPLANT
NDL HYPO 25X1 1.5 SAFETY (NEEDLE) IMPLANT
NDL SAFETY ECLIPSE 18X1.5 (NEEDLE) IMPLANT
NEEDLE HYPO 18GX1.5 SHARP (NEEDLE) ×2
NEEDLE HYPO 25X1 1.5 SAFETY (NEEDLE) ×2 IMPLANT
PACK BASIN DAY SURGERY FS (CUSTOM PROCEDURE TRAY) ×2 IMPLANT
PENCIL BUTTON HOLSTER BLD 10FT (ELECTRODE) ×2 IMPLANT
PIN SAFETY STERILE (MISCELLANEOUS) IMPLANT
SLEEVE SCD COMPRESS KNEE MED (MISCELLANEOUS) ×2 IMPLANT
SPONGE LAP 18X18 X RAY DECT (DISPOSABLE) ×4 IMPLANT
SUT MON AB 5-0 PS2 18 (SUTURE) ×4 IMPLANT
SUT PDS AB 2-0 CT2 27 (SUTURE) IMPLANT
SUT VIC AB 3-0 SH 27 (SUTURE) ×4
SUT VIC AB 3-0 SH 27X BRD (SUTURE) ×2 IMPLANT
SUT VICRYL 4-0 PS2 18IN ABS (SUTURE) ×4 IMPLANT
SYR 50ML LL SCALE MARK (SYRINGE) IMPLANT
SYR BULB IRRIGATION 50ML (SYRINGE) ×2 IMPLANT
SYR CONTROL 10ML LL (SYRINGE) IMPLANT
TOWEL OR 17X24 6PK STRL BLUE (TOWEL DISPOSABLE) ×4 IMPLANT
TUBE CONNECTING 20X1/4 (TUBING) ×2 IMPLANT
UNDERPAD 30X30 INCONTINENT (UNDERPADS AND DIAPERS) ×4 IMPLANT
WATER STERILE IRR 1000ML POUR (IV SOLUTION) IMPLANT
YANKAUER SUCT BULB TIP NO VENT (SUCTIONS) ×2 IMPLANT

## 2011-11-23 NOTE — Anesthesia Postprocedure Evaluation (Signed)
Anesthesia Post Note  Patient: [name]  Procedure(s) Performed: Procedure(s) (LRB): BREAST CAPSULECTOMY WITH IMPLANT EXCHANGE (Bilateral)  Anesthesia type: General  Patient location: PACU  Post pain: Pain level controlled  Post assessment: Patient's Cardiovascular Status Stable  Last Vitals:  Filed Vitals:   11/23/11 1300  BP: 108/66  Pulse: 81  Temp:   Resp: 13    Post vital signs: Reviewed and stable  Level of consciousness: alert  Complications: No apparent anesthesia complications

## 2011-11-23 NOTE — H&P (View-Only) (Signed)
  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 female.  HPI Aryanah Willis is a 41 y.o. female referred for a history and physical after immediate bilateral breast reconstruction with expander and FlexHD. She was diagnosed with right breast cancer after she palpated a mass. She underwent mammagram, U/S and bx which showed an invasive ductal carcinoma. We are waiting on the reports from the oncologist. She received chemotherapy which ended in February. She reports ER/PR positive and HER-2 positive tumor with BRCA negative markers. She does not have a family history of breast cancer and no previous abnormal mammagrams. She had her first child at 35 yrs of age. She is 5 feet 3 inches and weighs ~125 pounds. She was a 36A or B cup but would like to be a C cup. Her flaps are warm and dry. She started PT. She has 400 cc in the right and 390 cc in the left expander.  The following portions of the patient's history were reviewed and updated as appropriate: allergies, current medications, past family history, past medical history, past social history, past surgical history and problem list.  Review of Systems  Constitutional: Negative.   HENT: Negative.   Eyes: Negative.   Respiratory: Negative.   Cardiovascular: Negative.   Gastrointestinal: Negative.   Genitourinary: Negative.   Musculoskeletal: Negative.   Neurological: Negative.   Hematological: Negative.   Psychiatric/Behavioral: Negative.    Objective:     Physical Exam  Constitutional: She appears well-developed and well-nourished.  HENT:   Head: Normocephalic and atraumatic.  Eyes:  Conjunctivae normal are normal. Pupils are equal, round, and reactive to light.  Cardiovascular: Normal rate.   Pulmonary/Chest: Effort normal.  Abdominal: Soft. She exhibits no distension.  Neurological: She is alert.  Skin: Skin is warm.  Psychiatric: She has a normal mood and affect. Her behavior is normal. Judgment normal.      Assessment:  1.  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  Risks and complications were reviewed and include capsule contracture, infection, bleeding, pain, scar and risk of anesthesia. We added 40 cc of injectable saline to the right expander and 50 cc into the left for a total of 440/350cc in both expanders.    Medications Ordered This Encounter      diazepam (VALIUM) 2 MG tablet 30 tablet Take 1 tablet (2 mg total) by mouth every 12 (twelve) hours as needed for 10 days for Anxiety.     docusate sodium (COLACE) 100 MG capsule 20 Take 1 capsule (100 mg total) by mouth 2 times daily.   cephalexin (KEFLEX) 500 MG capsule 28 Take 1 capsule (500 mg total) by mouth 4 times daily.    HYDROcodone-acetaminophen (NORCO) 5-325 mg per tablet 30 Take 1 tablet by mouth every 6 (six) hours as needed for 10 days for Pain.    

## 2011-11-23 NOTE — Brief Op Note (Signed)
11/23/2011  11:44 AM  PATIENT:  [name]  41 y.o. female  PRE-OPERATIVE DIAGNOSIS:  Acquired absence of breasts secondary to treatment for breast cancer  POST-OPERATIVE DIAGNOSIS:  Acquired absence of breasts secondary to treatment for breast cancer  PROCEDURE:  Procedure(s) (LRB): BILATERAL BREAST CAPSULOTOMY WITH IMPLANT EXCHANGE  SURGEON:  Surgeon(s) and Role:    * Nayleen Janosik Sanger, DO - Primary  PHYSICIAN ASSISTANT: Shawn Rayburn, PA  ASSISTANTS: none   ANESTHESIA:   general  EBL:  Total I/O In: 1650 [I.V.:1650] Out: -   BLOOD ADMINISTERED:none  DRAINS: none   LOCAL MEDICATIONS USED:  NONE  SPECIMEN:  Source of Specimen:  mastectomy scars  DISPOSITION OF SPECIMEN:  PATHOLOGY  COUNTS:  YES  TOURNIQUET:  * No tourniquets in log *  DICTATION: dictated  PLAN OF CARE: Discharge to home after PACU  PATIENT DISPOSITION:  PACU - hemodynamically stable.   Delay start of Pharmacological VTE agent (>24hrs) due to surgical blood loss or risk of bleeding: no

## 2011-11-23 NOTE — Anesthesia Preprocedure Evaluation (Signed)
Anesthesia Evaluation  Patient identified by MRN, date of birth, ID band Patient awake    Reviewed: Allergy & Precautions, H&P , NPO status , Patient's Chart, lab work & pertinent test results, reviewed documented beta blocker date and time   History of Anesthesia Complications (+) PONV  Airway Mallampati: II TM Distance: >3 FB Neck ROM: full    Dental   Pulmonary asthma ,  breath sounds clear to auscultation        Cardiovascular negative cardio ROS  Rhythm:regular     Neuro/Psych  Headaches, negative neurological ROS  negative psych ROS   GI/Hepatic negative GI ROS, Neg liver ROS,   Endo/Other  negative endocrine ROS  Renal/GU negative Renal ROS  negative genitourinary   Musculoskeletal   Abdominal   Peds  Hematology negative hematology ROS (+)   Anesthesia Other Findings See surgeon's H&P   Reproductive/Obstetrics negative OB ROS                           Anesthesia Physical Anesthesia Plan  ASA: II  Anesthesia Plan: General   Post-op Pain Management:    Induction: Intravenous  Airway Management Planned: LMA  Additional Equipment:   Intra-op Plan:   Post-operative Plan: Extubation in OR  Informed Consent: I have reviewed the patients History and Physical, chart, labs and discussed the procedure including the risks, benefits and alternatives for the proposed anesthesia with the patient or authorized representative who has indicated his/her understanding and acceptance.   Dental Advisory Given  Plan Discussed with: CRNA and Surgeon  Anesthesia Plan Comments:         Anesthesia Quick Evaluation

## 2011-11-23 NOTE — Progress Notes (Signed)
Patient c/o skin itching after CHG cloths.  Given warm wash cloths to wash off with.  Issue resolved.

## 2011-11-23 NOTE — Interval H&P Note (Signed)
History and Physical Interval Note:  11/23/2011 9:56 AM  [name]  has presented today for surgery, with the diagnosis of breast cancer  The various methods of treatment have been discussed with the patient and family. After consideration of risks, benefits and other options for treatment, the patient has consented to  Procedure(s) (LRB): BREAST CAPSULECTOMY WITH IMPLANT EXCHANGE (Bilateral) as a surgical intervention .  The patient's history has been reviewed, patient examined, no change in status, stable for surgery.  I have reviewed the patient's chart and labs.  Questions were answered to the patient's satisfaction.     SANGER,Jaylie Neaves

## 2011-11-23 NOTE — Anesthesia Procedure Notes (Signed)
Procedure Name: LMA Insertion Date/Time: 11/23/2011 10:28 AM Performed by: Zenia Resides D Pre-anesthesia Checklist: Patient identified, Emergency Drugs available, Suction available, Patient being monitored and Timeout performed Patient Re-evaluated:Patient Re-evaluated prior to inductionOxygen Delivery Method: Circle System Utilized Preoxygenation: Pre-oxygenation with 100% oxygen Intubation Type: IV induction Ventilation: Mask ventilation without difficulty LMA: LMA inserted LMA Size: 4.0 Number of attempts: 1 Airway Equipment and Method: bite block Placement Confirmation: positive ETCO2 and breath sounds checked- equal and bilateral Tube secured with: Tape Dental Injury: Teeth and Oropharynx as per pre-operative assessment

## 2011-11-23 NOTE — Transfer of Care (Signed)
Immediate Anesthesia Transfer of Care Note  Patient: [name]  Procedure(s) Performed: Procedure(s) (LRB): BREAST CAPSULECTOMY WITH IMPLANT EXCHANGE (Bilateral)  Patient Location: PACU  Anesthesia Type: General  Level of Consciousness: awake  Airway & Oxygen Therapy: Patient Spontanous Breathing and Patient connected to face mask oxygen  Post-op Assessment: Report given to PACU RN and Post -op Vital signs reviewed and stable  Post vital signs: Reviewed and stable  Complications: No apparent anesthesia complications

## 2011-11-24 NOTE — Op Note (Signed)
[name], [name] NO.:  [id number]  MEDICAL RECORD NO.:  [id number]  LOCATION: [facility]       [facility]  PHYSICIAN:  Wayland Denis, DO      DATE OF BIRTH:  Dec 22, 1970  DATE OF PROCEDURE:  11/23/2011 DATE OF DISCHARGE:                              OPERATIVE REPORT   PREOPERATIVE DIAGNOSIS:  Bilateral acquired absence of breasts secondary to treatment of breast cancer with mastectomies.  POSTOPERATIVE DIAGNOSIS:  Bilateral acquired absence of breasts secondary to treatment of breast cancer with mastectomies.  PROCEDURES:  Removal of bilateral expanders with capsulotomies and placement of Mentor smooth gel high profile 375 mL implant.  SURGEON:  Wayland Denis, DO.  ASSISTANT:  Shawn Rayburn, P.A.  ANESTHESIA:  General.  INDICATION FOR PROCEDURE:  The patient is a 41 year old female who was diagnosed with breast cancer and underwent bilateral mastectomies with immediate reconstruction using expanders and FlexHD.  She underwent expansion and now presents for her secondary procedure.  Risks and complications were reviewed.  Consent was signed.  Risks included bleeding, pain, scar, capsular contracture, and risk of anesthesia, all questions were answered.  DESCRIPTION OF PROCEDURE:  The patient was taken to the operating room, placed on the operating room table in a supine position.  General anesthesia was administered.  Once adequate, a time-out was called.  All information was confirmed to be correct.  She was prepped and draped in the usual sterile fashion.  An elliptical incision was made around the previous mastectomy scar site in order to remove that portion of the scar and that was sent to Pathology.  The Bovie was used to dissect down in the inferior direction through the muscle and onto the expander.  The expander was evacuated of the fluid and then removed.  Capsulotomies were done circumferentially in order to  reduce the capsular contracture, and improve contour.  Hemostasis was achieved with electrocautery.  The pocket was irrigated with antibiotic solution, and the 375 mL expander for the Mentor high-profile gel implant was selected.  The implant was placed.  The muscle was reapproximated with 3-0 Vicryl followed by 4-0 Vicryl and the skin edges were reapproximated with 5-0 Monocryl.  The same exact procedure was done on both sides.  Dermabond was applied with ABDs and a breast binder.  The patient tolerated the procedure well.  There were no complications.  She was awoken and taken to the recovery room in stable condition.     Wayland Denis, DO     CS/MEDQ  D:  11/23/2011  T:  11/24/2011  Job:  [id number]

## 2011-12-05 ENCOUNTER — Telehealth: Payer: Self-pay | Admitting: *Deleted

## 2011-12-05 ENCOUNTER — Other Ambulatory Visit: Payer: Self-pay | Admitting: Physician Assistant

## 2011-12-05 NOTE — Telephone Encounter (Signed)
Changed patient appointment 12-14-2011 moved to berry at 1:45pm

## 2011-12-08 ENCOUNTER — Other Ambulatory Visit: Payer: Self-pay | Admitting: Dermatology

## 2011-12-14 ENCOUNTER — Ambulatory Visit (HOSPITAL_BASED_OUTPATIENT_CLINIC_OR_DEPARTMENT_OTHER): Payer: BC Managed Care – PPO | Admitting: Physician Assistant

## 2011-12-14 ENCOUNTER — Other Ambulatory Visit: Payer: BC Managed Care – PPO | Admitting: Lab

## 2011-12-14 ENCOUNTER — Ambulatory Visit (HOSPITAL_BASED_OUTPATIENT_CLINIC_OR_DEPARTMENT_OTHER)
Admission: RE | Admit: 2011-12-14 | Discharge: 2011-12-14 | Disposition: A | Discharge status: Home / Self Care | Payer: BC Managed Care – PPO | Source: Ambulatory Visit | Attending: Internal Medicine | Admitting: Internal Medicine

## 2011-12-14 ENCOUNTER — Other Ambulatory Visit: Payer: Self-pay | Admitting: Oncology

## 2011-12-14 ENCOUNTER — Ambulatory Visit (HOSPITAL_COMMUNITY)
Admission: RE | Admit: 2011-12-14 | Discharge: 2011-12-14 | Disposition: A | Discharge status: Home / Self Care | Payer: BC Managed Care – PPO | Source: Ambulatory Visit | Attending: Internal Medicine | Admitting: Internal Medicine

## 2011-12-14 ENCOUNTER — Encounter (HOSPITAL_COMMUNITY): Payer: Self-pay

## 2011-12-14 ENCOUNTER — Ambulatory Visit (HOSPITAL_BASED_OUTPATIENT_CLINIC_OR_DEPARTMENT_OTHER): Payer: BC Managed Care – PPO

## 2011-12-14 ENCOUNTER — Encounter: Payer: Self-pay | Admitting: Physician Assistant

## 2011-12-14 VITALS — BP 96/68 | HR 72 | Ht 63.0 in | Wt 127.8 lb

## 2011-12-14 VITALS — BP 108/64 | HR 76 | Temp 98.3°F | Resp 20 | Ht 63.0 in | Wt 128.5 lb

## 2011-12-14 DIAGNOSIS — C50319 Malignant neoplasm of lower-inner quadrant of unspecified female breast: Secondary | ICD-10-CM

## 2011-12-14 DIAGNOSIS — Z09 Encounter for follow-up examination after completed treatment for conditions other than malignant neoplasm: Secondary | ICD-10-CM

## 2011-12-14 DIAGNOSIS — R599 Enlarged lymph nodes, unspecified: Secondary | ICD-10-CM

## 2011-12-14 DIAGNOSIS — C50919 Malignant neoplasm of unspecified site of unspecified female breast: Secondary | ICD-10-CM

## 2011-12-14 DIAGNOSIS — N63 Unspecified lump in unspecified breast: Secondary | ICD-10-CM

## 2011-12-14 DIAGNOSIS — Z9013 Acquired absence of bilateral breasts and nipples: Secondary | ICD-10-CM

## 2011-12-14 DIAGNOSIS — Z5112 Encounter for antineoplastic immunotherapy: Secondary | ICD-10-CM

## 2011-12-14 MED ORDER — HEPARIN SOD (PORK) LOCK FLUSH 100 UNIT/ML IV SOLN
500.0000 [IU] | Freq: Once | INTRAVENOUS | Status: AC | PRN
  Administered 2011-12-14: 500 [IU]
  Filled 2011-12-14: qty 5

## 2011-12-14 MED ORDER — SODIUM CHLORIDE 0.9 % IJ SOLN
10.0000 mL | INTRAMUSCULAR | Status: DC | PRN
  Administered 2011-12-14: 10 mL
  Filled 2011-12-14: qty 10

## 2011-12-14 MED ORDER — SODIUM CHLORIDE 0.9 % IV SOLN
6.0000 mg/kg | Freq: Once | INTRAVENOUS | Status: AC
  Administered 2011-12-14: 357 mg via INTRAVENOUS
  Filled 2011-12-14: qty 17

## 2011-12-14 MED ORDER — SODIUM CHLORIDE 0.9 % IV SOLN
Freq: Once | INTRAVENOUS | Status: AC
  Administered 2011-12-14: 16:00:00 via INTRAVENOUS

## 2011-12-14 NOTE — Progress Notes (Signed)
General Surgeon: Dr Derrell Lolling Plastic Surgeon: Dr Kelly Splinter Oncologist: Dr Darnelle Catalan  HPI: Mrs. Nater is a 41 yo with recently diagnosed (12/22/2010) right invasive ductal carcinoma, grade 2, estrogen receptor positive, progesterone receptor positive and Her2 amplification. She was started on carboplatin and docetaxel together with Herceptin every 3 weeks x6 (first dose last week) then Herceptin is to be continued for a total of 1 year. She was referred to Dr. Gala Romney for close monitoring for cardiotoxicity while on herceptin.   Echos:  01/19/11: EF 50-55%. Lateral S' velocity 9.2 cm/s  05/26/11: EF 55%. Lateral s' velocity 9.4 to 10.5 cm/s  09/18/11 EF 55% lat s' -> poor window, unable to determine  12/14/11 EF 55-60% 9.1  06/30/11 S/P bilateral mastectomies and placement of tissue expanders  She returns for follow up today with echo. She continues on herceptin  q3 weeks, she has 4 more treatments (including todays).  She denies dyspnea/orthopnea/PND/CP.  Continues to work full time.  Occ dizziness.     ROS: All systems negative except as listed in HPI, PMH and Problem List.  Past Medical History  Diagnosis Date  . Migraines   . Asthma     as a child  . Breast cancer Sept 2012    right breast  . PONV (postoperative nausea and vomiting)   . Rash     neck    Current Outpatient Prescriptions  Medication Sig Dispense Refill  . b complex vitamins tablet Take 1 tablet by mouth daily.      [name] BIOTIN PO Take 10,000 mcg by mouth daily.      . Calcium Carbonate-Vitamin D (CALCIUM-VITAMIN D) 500-200 MG-UNIT per tablet Take 1 tablet by mouth 2 (two) times daily with a meal.      . cephALEXin (KEFLEX) 500 MG capsule       . diazepam (VALIUM) 2 MG tablet       . doxycycline (VIBRAMYCIN) 100 MG capsule       . fish oil-omega-3 fatty acids 1000 MG capsule Take 2 g by mouth daily.      [name] gabapentin (NEURONTIN) 100 MG capsule       . HYDROcodone-acetaminophen (NORCO/VICODIN) 5-325 MG per tablet         . ibuprofen (ADVIL,MOTRIN) 800 MG tablet       . loratadine (CLARITIN) 10 MG tablet Take 10 mg by mouth daily.       [name] LORazepam (ATIVAN) 1 MG tablet TAKE ONE-HALF TABLET BY MOUTH TWICE DAILY AS NEEDED FOR ANXIETY  30 tablet  0  . prenatal vitamin w/FE, FA (PRENATAL 1 + 1) 27-1 MG TABS Take 1 tablet by mouth Daily.      . tamoxifen (NOLVADEX) 20 MG tablet TAKE ONE TABLET BY MOUTH EVERY DAY  30 tablet  2  . tobramycin-dexamethasone (TOBRADEX) ophthalmic solution Place 1 drop into both eyes once. As needed  5 mL  3     PHYSICAL EXAM: Filed Vitals:   12/14/11 1111  BP: 96/68  Pulse: 72  Height: 5\' 3"  (1.6 m)  Weight: 127 lb 12.8 oz (57.97 kg)  SpO2: 100%    General:  Well appearing. No resp difficulty HEENT: normal Neck: supple. JVP flat. Carotids 2+ bilaterally; no bruits. No lymphadenopathy or thryomegaly appreciated. Cor: PMI normal. Regular rate & rhythm. No rubs, gallops or murmurs. L upper chest porta cath Lungs: clear Abdomen: soft, nontender, nondistended. No hepatosplenomegaly. No bruits or masses. Good bowel sounds. Extremities: no cyanosis, clubbing, rash, edema Neuro: alert &  orientedx3, cranial nerves grossly intact. Moves all 4 extremities w/o difficulty. Affect pleasant.    ASSESSMENT & PLAN:

## 2011-12-14 NOTE — Progress Notes (Signed)
1510 Pt states she took Tylenol, Benadryl at home prior to appt.

## 2011-12-14 NOTE — Progress Notes (Signed)
  Echocardiogram 2D Echocardiogram has been performed.  Georgian Co 12/14/2011, 11:09 AM

## 2011-12-14 NOTE — Progress Notes (Signed)
ID: [name]   DOB: 06-24-1970  MR#: [id number]  WUJ#:[id number]  HISTORY OF PRESENT ILLNESS: Trinidi Toppins (goes by "Joyclyn Plazola") is a [place] woman who, at the age of 41, was referred by Dr. Coral Ceo for treatment of right breast carcinoma.  The patient palpated a mass in her right breast which she brought to Dr. Verdell Carmine attention. A prior screening mammogram on 02/06/2010 had been unremarkable. The patient was then scheduled for bilateral diagnostic mammogram and right breast ultrasonography on 12/22/2010, the studies demonstrating an ill defined mass with heterogeneous calcifications in the inner lower right breast, measuring 1.7 cm. The breasts are heterogeneously dense, but there were no other mammographic abnormalities in either breast. Clinically, there was a palpable firm mass at the 5:00 position of the right breast, 5 cm from the nipple. There were no palpable abnormalities in the right axilla. Ultrasound confirmed a 1.7 cm irregular, hypoechoic mass in the same position. There were some lymph nodes noted in the right exam of, one measuring 1.6 cm with a minimally thickened cortex inferiorly.  An ultrasound-guided core biopsy on 12/23/2010 [phone]) showed an invasive ductal carcinoma, grade 2, ER positive at 88%, PR positive at 11 is %, HER-2/neu positive with a FISH ratio of 4.61, and an MIB-1 of 36%.  The patient was referred to Dr. Claud Kelp and bilateral breast MRIs were obtained 12/30/2010. This showed the mass in question in the right breast to measure 2.0 cm. In the right axilla there was a 1.8 cm lymph node with a thickened cortex. The left breast was unremarkable. With this information she proceeded to neoadjuvant treatment and definitive surgery as detailed below   INTERVAL HISTORY: Tawanna returns accompanied by her husband, Council [employer]ic,  for followup of her breast cancer. She continues on tamoxifen. She recently had a Pap smear with Dr. Clearance Coots which was  normal. A transvaginal ultrasound, she tells me, showed some thickening of the endometrial lining, and the plan to do a biopsy. She was not postmenopausal when she initiated chemotherapy, but has not resumed her period since December 2012.   Chrishonda is also receiving trastuzumab every 3 weeks and is due for her next dose today. She had her repeat echocardiogram and followup with the cardiologist earlier today.  Since her last appointment here, Theora has also had 3 moles removed, 2 of which showed dysplasia. She continues to have skin checks regularly.  Emanie and her husband had many questions today, many of which involved her followup in the future. They're very concerned about how frequently she will be able to have scans "just to make sure" and we spent some time talking about this today. (They understand that they will review her followup plan with Dr. Darnelle Catalan when he sees them in October.) Over half of our 50 minute appointment today was spent answering these questions, reviewing her treatment plan, and coordinating care.  REVIEW OF SYSTEMS:  Zamya has had no fevers or chills. She has only mild hot flashes. She has no nausea or emesis. She feels a little bloated at times, but denies any change in bowel habits. No cough, phlegm production, shortness of breath, or chest pain. No abnormal headaches or dizziness. She continues to have muscle aches in her legs bilaterally, including her knees. She continues to have some fatigue.  Otherwise a detailed review of systems is stable and noncontributory.  PAST MEDICAL HISTORY: Past Medical History  Diagnosis Date  . Migraines   . Asthma     as  a child  . Breast cancer Sept 2012    right breast  . PONV (postoperative nausea and vomiting)   . Rash     neck  1. History of prior adenoidectomy. 2. History of rhinoplasty.   3. History of multiple uterine cervical treatments for precancerous lesions, none for the past 10 years or so. 4. History of  fibroid tumor surgery. 5. History of D and C. 6. History of dysplastic nevi removed by Dr. Yetta Barre. 7. History of right "lazy eye" corrective surgery. 8. History of approximately 10-pack-years of tobacco abuse, resolved.  9. History of possible migraines (morning headaches). History of asthma as a child.  PAST SURGICAL HISTORY: Past Surgical History  Procedure Date  . Rhinoplasty   . Adenoidectomy   . Fibroid tumor surgery   . Dilation and curettage of uterus   . Uterine cervical treatments 2002    for precancerous lesions  . Lazy eye     corrective surgery  . Mastectomy w/ sentinel node biopsy 06/29/2011    Procedure: MASTECTOMY WITH SENTINEL LYMPH NODE BIOPSY;  Surgeon: [name], MD;  Location: [facility] OR;  Service: General;  Laterality: Right;  right skin spairing mstectomy and right sentinel lymph node biopsy  . Breast reconstruction 06/29/2011    Procedure: BREAST RECONSTRUCTION;  Surgeon: Wayland Denis, DO;  Location: [facility] OR;  Service: Plastics;  Laterality: Bilateral;  Immediate Bilateral Breast Reconstruction with Bilateral Tissue Expanders and placement of  Alloderm     FAMILY HISTORY Family History  Problem Relation Age of Onset  . Adopted: Yes  . Heart attack Father   . Cancer Brother     prostate and lung  . Cancer Cousin     melanoma  . Cancer Cousin     breast  :  The patient's biological father died at the age of 80 from myocardial infarction.  The patient's biological mother is alive at age 77.  The patient was adopted by her biological mother's parents.  She has 3 half-brothers.  No sisters.  One cousin on her mother's side died from melanoma at the age of 76.  There is other cancer history in the adopted side but these are not even half-brothers or sisters, they are children of her adopted parents who are really her grandparents so these would be uncles and aunts (1 lymph node cancer, 1 prostate cancer, 1 breast cancer at the age of 61).  GYNECOLOGIC HISTORY: She  had menarche age 65 or 32.  She is still having periods, the most recent 1 being September 13.  She is to have an IUD in place.  Currently is still using oral contraceptives and she has been advised to discontinue this.  She is GX, P1, first pregnancy to term at age 32.  SOCIAL HISTORY: She works in a Agricultural consultant.  Her husband, Lada Fulbright owns a family business called [employer].  They have been married 5 years.  They have a son, [name], age 32.  Council Mechanic also has a daughter, Telina Kleckley, who lives in [place] and is 42 years old.  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  The patient is not a church attender.    ADVANCED DIRECTIVES: Not in place  HEALTH MAINTENANCE: History  Substance Use Topics  . Smoking status: Former Smoker    Quit date: 07/20/1996  . Smokeless tobacco: Never Used  . Alcohol Use: No     Colonoscopy:  PAP: ChClearance Coots  Bone density:  Lipid panel:  Allergies  Allergen Reactions  . Tussionex Pennkinetic Er (Hydrocod Polst-Cpm Polst Er) Other (See Comments)    halucinations  . Codeine Other (See Comments)    unkown  . Morphine And Related Anxiety    anxiety  . Penicillins Rash    Current Outpatient Prescriptions  Medication Sig Dispense Refill  . b complex vitamins tablet Take 1 tablet by mouth daily.      . Calcium Carbonate-Vitamin D (CALCIUM-VITAMIN D) 500-200 MG-UNIT per tablet Take 1 tablet by mouth 2 (two) times daily with a meal.      . diazepam (VALIUM) 2 MG tablet       . doxycycline (VIBRAMYCIN) 100 MG capsule 100 mg 2 (two) times daily.       . fish oil-omega-3 fatty acids 1000 MG capsule Take 2 g by mouth daily.      [name] gabapentin (NEURONTIN) 100 MG capsule Take 100 mg by mouth as needed.       [name] HYDROcodone-acetaminophen (NORCO/VICODIN) 5-325 MG per tablet as needed.       [name] ibuprofen (ADVIL,MOTRIN) 800 MG tablet as needed.       . loratadine (CLARITIN) 10 MG tablet Take 10 mg by mouth daily.       [name] LORazepam (ATIVAN) 1 MG tablet  TAKE ONE-HALF TABLET BY MOUTH TWICE DAILY AS NEEDED FOR ANXIETY  30 tablet  0  . OVER THE COUNTER MEDICATION Take 1 tablet by mouth daily. Hair, skin, and nails vitamin      . prenatal vitamin w/FE, FA (PRENATAL 1 + 1) 27-1 MG TABS Take 1 tablet by mouth Daily.      . tamoxifen (NOLVADEX) 20 MG tablet TAKE ONE TABLET BY MOUTH EVERY DAY  30 tablet  2  . tobramycin-dexamethasone (TOBRADEX) ophthalmic solution Place 1 drop into both eyes once. As needed  5 mL  3   No current facility-administered medications for this visit.   Facility-Administered Medications Ordered in Other Visits  Medication Dose Route Frequency Provider Last Rate Last Dose  . 0.9 %  sodium chloride infusion   Intravenous Once Revella Shelton Allegra Grana, PA      . heparin lock flush 100 unit/mL  500 Units Intracatheter Once PRN Catalina Gravel, PA   500 Units at 12/14/11 1616  . sodium chloride 0.9 % injection 10 mL  10 mL Intracatheter PRN Catalina Gravel, PA   10 mL at 12/14/11 1616  . trastuzumab (HERCEPTIN) 357 mg in sodium chloride 0.9 % 250 mL chemo infusion  6 mg/kg (Treatment Plan Actual) Intravenous Once Catalina Gravel, PA   357 mg at 12/14/11 1539     OBJECTIVE: A young white female who appears comfortable and in no acute distress  Filed Vitals:   12/14/11 1347  BP: 108/64  Pulse: 76  Temp: 98.3 F (36.8 C)  Resp: 20     Body mass index is 22.76 kg/(m^2).    ECOG FS: 0 Filed Weights   12/14/11 1347  Weight: 128 lb 8 oz (58.287 kg)    Sclerae unicteric Oropharynx clear No cervical supraclavicular or axillary adenopathy,  Lungs no rales or rhonchi Heart regular rate and rhythm Abd benign, soft, nontender MSK no focal spinal tenderness, No peripheral edema Neuro: nonfocal Breasts: Deferred   LAB RESULTS: Lab Results  Component Value Date   WBC 4.9 11/22/2011   NEUTROABS 2.7 11/22/2011   HGB 12.3 11/22/2011   HCT 36.8 11/22/2011   MCV 94.4 11/22/2011   PLT 172 11/22/2011  Chemistry      Component Value Date/Time    NA 139 11/02/2011 1357   K 3.7 11/02/2011 1357   CL 104 11/02/2011 1357   CO2 29 11/02/2011 1357   BUN 16 11/02/2011 1357   CREATININE 0.79 11/02/2011 1357      Component Value Date/Time   CALCIUM 9.2 11/02/2011 1357   ALKPHOS 57 11/02/2011 1357   AST 17 11/02/2011 1357   ALT 15 11/02/2011 1357   BILITOT 0.5 11/02/2011 1357       STUDIES: Echocardiogram 12/14/2011 showed a well preserved ejection fraction at 55 - 60 %.  ASSESSMENT: 41 y.o.  BRCA 1-2 negative [name], [place] woman,   (1) status post right breast biopsy in September 2012 for a clinically 2 cm invasive ductal carcinoma, with some enlarged Right axillary lymph nodes, the largest one of which was negative by biopsy. The tumor was grade 2, estrogen receptor positive at 88%, progesterone receptor positive at 11%, HER-2/neu positive by CISH with a ratio of 4.61, and a proliferation marker of 36%.   (2) treated in the neoadjuvant setting with Q 3 week docetaxel/ carboplatin/ trastuzumab x6, completed 05/18/2011.  Continues on trastuzumab every 3 weeks to complete one year.  (3) s/p bilateral mastectomies with sentinel node biopsy 06/28/1969 for a residual right-sided ypT1c ypN0 invasive ductal carcinoma, grade 2, with immediate expander placement  (4) on tamoxifen as of May 2013   PLAN:   Lailyn will continue with trastuzumab every 3 weeks and will receive her next dose today. She will complete out her one-year course in early November.   She will continue on tamoxifen, and will also continue to be followed by her gynecologist. She has had no vaginal bleeding, as noted above, but the question is whether or not she is truly postmenopausal, or will perhaps start her periods again. She does know to use barrier contraceptives.  The plan is to continue tamoxifen for a minimum of 2 years. We will obtain [facility] and estradiol levels in December to assess for menopause.  Sharayah will see Dr. Darnelle Catalan again in October, just before completing her  year of trastuzumab. She voices her understanding and agreement with this plan, and will call with any changes or problems.   Rajah Lamba    12/14/2011

## 2011-12-14 NOTE — Patient Instructions (Signed)
[facility] Discharge Instructions for Patients Receiving Chemotherapy  Today you received the following chemotherapy agents Herceptin  To help prevent nausea and vomiting after your treatment, we encourage you to take your nausea medication as needed  If you develop nausea and vomiting that is not controlled by your nausea medication, call the clinic. If it is after clinic hours your family physician or the after hours number for the clinic or go to the Emergency Department.   BELOW ARE SYMPTOMS THAT SHOULD BE REPORTED IMMEDIATELY:  *FEVER GREATER THAN 100.5 F  *CHILLS WITH OR WITHOUT FEVER  NAUSEA AND VOMITING THAT IS NOT CONTROLLED WITH YOUR NAUSEA MEDICATION  *UNUSUAL SHORTNESS OF BREATH  *UNUSUAL BRUISING OR BLEEDING  TENDERNESS IN MOUTH AND THROAT WITH OR WITHOUT PRESENCE OF ULCERS  *URINARY PROBLEMS  *BOWEL PROBLEMS  UNUSUAL RASH Items with * indicate a potential emergency and should be followed up as soon as possible.  One of the nurses will contact you 24 hours after your treatment. Please let the nurse know about any problems that you may have experienced. Feel free to call the clinic you have any questions or concerns. The clinic phone number is [phone].   I have been informed and understand all the instructions given to me. I know to contact the clinic, my physician, or go to the Emergency Department if any problems should occur. I do not have any questions at this time, but understand that I may call the clinic during office hours   should I have any questions or need assistance in obtaining follow up care.    __________________________________________  _____________  __________ Signature of Patient or Authorized Representative            Date                   Time    __________________________________________ Nurse's Signature

## 2011-12-20 NOTE — Assessment & Plan Note (Signed)
EF remains stable on herceptin therapy.  Have reviewed echos with Dr. Gala Romney.  Patient ok to continue therapy at this time.  She will complete last 3 therapies and follow up with echo after completion of therapy.

## 2012-01-03 ENCOUNTER — Other Ambulatory Visit: Payer: Self-pay | Admitting: *Deleted

## 2012-01-03 DIAGNOSIS — C50919 Malignant neoplasm of unspecified site of unspecified female breast: Secondary | ICD-10-CM

## 2012-01-04 ENCOUNTER — Other Ambulatory Visit: Payer: Self-pay

## 2012-01-04 ENCOUNTER — Ambulatory Visit (INDEPENDENT_AMBULATORY_CARE_PROVIDER_SITE_OTHER): Payer: BC Managed Care – PPO | Admitting: General Surgery

## 2012-01-04 ENCOUNTER — Other Ambulatory Visit (HOSPITAL_BASED_OUTPATIENT_CLINIC_OR_DEPARTMENT_OTHER): Payer: BC Managed Care – PPO | Admitting: Lab

## 2012-01-04 ENCOUNTER — Encounter (INDEPENDENT_AMBULATORY_CARE_PROVIDER_SITE_OTHER): Payer: Self-pay | Admitting: General Surgery

## 2012-01-04 ENCOUNTER — Ambulatory Visit (HOSPITAL_BASED_OUTPATIENT_CLINIC_OR_DEPARTMENT_OTHER): Payer: BC Managed Care – PPO

## 2012-01-04 VITALS — BP 98/66 | HR 68 | Temp 97.3°F | Resp 16 | Ht 63.0 in | Wt 124.4 lb

## 2012-01-04 DIAGNOSIS — N63 Unspecified lump in unspecified breast: Secondary | ICD-10-CM

## 2012-01-04 DIAGNOSIS — C50919 Malignant neoplasm of unspecified site of unspecified female breast: Secondary | ICD-10-CM

## 2012-01-04 DIAGNOSIS — C50319 Malignant neoplasm of lower-inner quadrant of unspecified female breast: Secondary | ICD-10-CM

## 2012-01-04 DIAGNOSIS — Z5112 Encounter for antineoplastic immunotherapy: Secondary | ICD-10-CM

## 2012-01-04 LAB — CBC WITH DIFFERENTIAL/PLATELET
BASO%: 0.2 % (ref 0.0–2.0)
Basophils Absolute: 0 10*3/uL (ref 0.0–0.1)
EOS%: 1.1 % (ref 0.0–7.0)
Eosinophils Absolute: 0.1 10*3/uL (ref 0.0–0.5)
HCT: 38.1 % (ref 34.8–46.6)
HGB: 12.9 g/dL (ref 11.6–15.9)
LYMPH%: 31.6 % (ref 14.0–49.7)
MCH: 31.6 pg (ref 25.1–34.0)
MCHC: 33.9 g/dL (ref 31.5–36.0)
MCV: 93.4 fL (ref 79.5–101.0)
MONO#: 0.3 10*3/uL (ref 0.1–0.9)
MONO%: 4.9 % (ref 0.0–14.0)
NEUT#: 3.8 10*3/uL (ref 1.5–6.5)
NEUT%: 62.2 % (ref 38.4–76.8)
Platelets: 192 10*3/uL (ref 145–400)
RBC: 4.08 10*6/uL (ref 3.70–5.45)
RDW: 12.6 % (ref 11.2–14.5)
WBC: 6.1 10*3/uL (ref 3.9–10.3)
lymph#: 1.9 10*3/uL (ref 0.9–3.3)

## 2012-01-04 MED ORDER — TRASTUZUMAB CHEMO INJECTION 440 MG
6.0000 mg/kg | Freq: Once | INTRAVENOUS | Status: AC
  Administered 2012-01-04: 357 mg via INTRAVENOUS
  Filled 2012-01-04: qty 17

## 2012-01-04 MED ORDER — SODIUM CHLORIDE 0.9 % IJ SOLN
10.0000 mL | INTRAMUSCULAR | Status: DC | PRN
  Administered 2012-01-04: 10 mL
  Filled 2012-01-04: qty 10

## 2012-01-04 MED ORDER — DIPHENHYDRAMINE HCL 25 MG PO CAPS
25.0000 mg | ORAL_CAPSULE | Freq: Once | ORAL | Status: AC
  Administered 2012-01-04: 25 mg via ORAL

## 2012-01-04 MED ORDER — HEPARIN SOD (PORK) LOCK FLUSH 100 UNIT/ML IV SOLN
500.0000 [IU] | Freq: Once | INTRAVENOUS | Status: AC | PRN
  Administered 2012-01-04: 500 [IU]
  Filled 2012-01-04: qty 5

## 2012-01-04 MED ORDER — SODIUM CHLORIDE 0.9 % IV SOLN
Freq: Once | INTRAVENOUS | Status: AC
  Administered 2012-01-04: 14:00:00 via INTRAVENOUS

## 2012-01-04 NOTE — Progress Notes (Signed)
Patient ID: [name], female   DOB: [birth year], 41 y.o.   MRN: [id number] History: This patient returns for long-term followup regarding her right breast cancer. She presented with locally advanced cancer of the right breast. She underwent neoadjuvant chemotherapy. On 06/29/2011 she underwent right total mastectomy and sentinel node biopsy and left total mastectomy and tissue expanders. The right breast revealed invasive ductal carcinoma, ER-positive, HER-2 positive. Pathologic stage ypT1c, yp N0. BRCA testing was negative. On 11/23/2011 Dr. Kelly Splinter removed her expanders and replaced with implants. She is recovering from that surgery without complications. She's going to see Dr. Claudine Mouton tomorrow. She will complete Herceptin chemotherapy in the next month or 2 and wantsl to get her Port-A-Cath out. I told her to call my office to set that up.  ROS: 10 system review of systems is performed and is negative except as described above.  Exam: Patient looks well. No distress. Neck without adenopathy Bilateral mastectomy skin flaps are healthy. No nodules ulcerations or adenopathy. Implants in place and are symmetrical with good cosmetic result. She is a little bit of wrinkling when she is correct but not in supine.  Assessment locally advanced cancer right breast, status post neoadjuvant chemotherapy and subsequent bilateral mastectomy and right sentinel node biopsy. Invasive ductal carcinoma, receptor positive, HER-2 positive. YpT1c, ypN0. BRCA negative  Status post recent removal of tissue expanders and replacement with permanent implants.  Plan: She will call me when it is time to remove her Port-A-Cath. We will do this under local anesthesia or under sedation depending on  her preference Return to see me in March 2014 for physical exam.   Angelia Mould. Derrell Lolling, M.D., [facility] General and Minimally invasive Surgery Breast and Colorectal Surgery Office:    [phone] Pager:   [phone]

## 2012-01-04 NOTE — Patient Instructions (Signed)
Your physical exam today is encouraging. The skin is healthy. There are no enlarged lymph nodes. There is no evidence of cancer.  Call Dr. Jacinto Halim office when you are through with chemotherapy and we will schedule removal of the Port-A-Cath.  Otherwise return to see Dr. Derrell Lolling in March of 2014 for a one year checkup.

## 2012-01-04 NOTE — Patient Instructions (Addendum)
[facility] Discharge Instructions for Patients Receiving Chemotherapy  Today you received the following chemotherapy agents Herceptin.  If it is after clinic hours your family physician or the after hours number for the clinic or go to the Emergency Department.   BELOW ARE SYMPTOMS THAT SHOULD BE REPORTED IMMEDIATELY:  *FEVER GREATER THAN 100.5 F  *CHILLS WITH OR WITHOUT FEVER  NAUSEA AND VOMITING THAT IS NOT CONTROLLED WITH YOUR NAUSEA MEDICATION  *UNUSUAL SHORTNESS OF BREATH  *UNUSUAL BRUISING OR BLEEDING  TENDERNESS IN MOUTH AND THROAT WITH OR WITHOUT PRESENCE OF ULCERS  *URINARY PROBLEMS  *BOWEL PROBLEMS  UNUSUAL RASH Items with * indicate a potential emergency and should be followed up as soon as possible.  Feel free to call the clinic you have any questions or concerns. The clinic phone number is [phone].      

## 2012-01-05 DIAGNOSIS — Z9889 Other specified postprocedural states: Secondary | ICD-10-CM | POA: Insufficient documentation

## 2012-01-19 ENCOUNTER — Other Ambulatory Visit: Payer: Self-pay | Admitting: Oncology

## 2012-01-19 DIAGNOSIS — C50919 Malignant neoplasm of unspecified site of unspecified female breast: Secondary | ICD-10-CM

## 2012-01-24 ENCOUNTER — Other Ambulatory Visit: Payer: Self-pay | Admitting: Medical Oncology

## 2012-01-24 DIAGNOSIS — C50919 Malignant neoplasm of unspecified site of unspecified female breast: Secondary | ICD-10-CM

## 2012-01-25 ENCOUNTER — Other Ambulatory Visit (HOSPITAL_BASED_OUTPATIENT_CLINIC_OR_DEPARTMENT_OTHER): Payer: BC Managed Care – PPO | Admitting: Lab

## 2012-01-25 ENCOUNTER — Other Ambulatory Visit: Payer: BC Managed Care – PPO | Admitting: Lab

## 2012-01-25 ENCOUNTER — Ambulatory Visit (HOSPITAL_BASED_OUTPATIENT_CLINIC_OR_DEPARTMENT_OTHER): Payer: BC Managed Care – PPO

## 2012-01-25 ENCOUNTER — Ambulatory Visit (HOSPITAL_BASED_OUTPATIENT_CLINIC_OR_DEPARTMENT_OTHER): Payer: BC Managed Care – PPO | Admitting: Oncology

## 2012-01-25 ENCOUNTER — Ambulatory Visit: Payer: BC Managed Care – PPO | Admitting: Physician Assistant

## 2012-01-25 VITALS — BP 106/71 | HR 67 | Temp 97.9°F | Resp 20 | Ht 63.0 in | Wt 126.6 lb

## 2012-01-25 DIAGNOSIS — Z5112 Encounter for antineoplastic immunotherapy: Secondary | ICD-10-CM

## 2012-01-25 DIAGNOSIS — C50319 Malignant neoplasm of lower-inner quadrant of unspecified female breast: Secondary | ICD-10-CM

## 2012-01-25 DIAGNOSIS — C50919 Malignant neoplasm of unspecified site of unspecified female breast: Secondary | ICD-10-CM

## 2012-01-25 DIAGNOSIS — N63 Unspecified lump in unspecified breast: Secondary | ICD-10-CM

## 2012-01-25 DIAGNOSIS — Z170 Estrogen receptor positive status [ER+]: Secondary | ICD-10-CM

## 2012-01-25 LAB — CBC WITH DIFFERENTIAL/PLATELET
BASO%: 0.3 % (ref 0.0–2.0)
Basophils Absolute: 0 10*3/uL (ref 0.0–0.1)
EOS%: 0.8 % (ref 0.0–7.0)
Eosinophils Absolute: 0.1 10*3/uL (ref 0.0–0.5)
HCT: 38.7 % (ref 34.8–46.6)
HGB: 13.3 g/dL (ref 11.6–15.9)
LYMPH%: 29.5 % (ref 14.0–49.7)
MCH: 32.8 pg (ref 25.1–34.0)
MCHC: 34.4 g/dL (ref 31.5–36.0)
MCV: 95.6 fL (ref 79.5–101.0)
MONO#: 0.4 10*3/uL (ref 0.1–0.9)
MONO%: 5.8 % (ref 0.0–14.0)
NEUT#: 4.4 10*3/uL (ref 1.5–6.5)
NEUT%: 63.6 % (ref 38.4–76.8)
Platelets: 172 10*3/uL (ref 145–400)
RBC: 4.05 10*6/uL (ref 3.70–5.45)
RDW: 12.6 % (ref 11.2–14.5)
WBC: 6.9 10*3/uL (ref 3.9–10.3)
lymph#: 2 10*3/uL (ref 0.9–3.3)

## 2012-01-25 LAB — COMPREHENSIVE METABOLIC PANEL (CC13)
ALT: 16 U/L (ref 0–55)
AST: 17 U/L (ref 5–34)
Albumin: 3.9 g/dL (ref 3.5–5.0)
Alkaline Phosphatase: 56 U/L (ref 40–150)
BUN: 18 mg/dL (ref 7.0–26.0)
CO2: 27 mEq/L (ref 22–29)
Calcium: 9.7 mg/dL (ref 8.4–10.4)
Chloride: 105 mEq/L (ref 98–107)
Creatinine: 0.9 mg/dL (ref 0.6–1.1)
Glucose: 80 mg/dl (ref 70–99)
Potassium: 4.1 mEq/L (ref 3.5–5.1)
Sodium: 140 mEq/L (ref 136–145)
Total Bilirubin: 0.5 mg/dL (ref 0.20–1.20)
Total Protein: 6.1 g/dL — ABNORMAL LOW (ref 6.4–8.3)

## 2012-01-25 MED ORDER — ACETAMINOPHEN 325 MG PO TABS: 650.0000 mg | ORAL_TABLET | Freq: Once | ORAL | Status: DC

## 2012-01-25 MED ORDER — TRASTUZUMAB CHEMO INJECTION 440 MG
6.0000 mg/kg | Freq: Once | INTRAVENOUS | Status: AC
  Administered 2012-01-25: 357 mg via INTRAVENOUS
  Filled 2012-01-25: qty 17

## 2012-01-25 MED ORDER — SODIUM CHLORIDE 0.9 % IV SOLN
Freq: Once | INTRAVENOUS | Status: AC
  Administered 2012-01-25: 15:00:00 via INTRAVENOUS

## 2012-01-25 MED ORDER — SODIUM CHLORIDE 0.9 % IJ SOLN
10.0000 mL | INTRAMUSCULAR | Status: DC | PRN
  Administered 2012-01-25: 10 mL
  Filled 2012-01-25: qty 10

## 2012-01-25 MED ORDER — DIPHENHYDRAMINE HCL 25 MG PO CAPS: 25.0000 mg | ORAL_CAPSULE | Freq: Once | ORAL | Status: DC

## 2012-01-25 MED ORDER — HEPARIN SOD (PORK) LOCK FLUSH 100 UNIT/ML IV SOLN
500.0000 [IU] | Freq: Once | INTRAVENOUS | Status: AC | PRN
Start: 2012-01-25 — End: 2012-01-25
  Administered 2012-01-25: 500 [IU]
  Filled 2012-01-25: qty 5

## 2012-01-25 NOTE — Progress Notes (Signed)
ID: [name]   DOB: 21-Aug-1970  MR#: [id number]  ZHY#:[id number]   PCP: Provider Not In System GYN: [name] SU: [name] OTHER [name]: [name]  HISTORY OF PRESENT ILLNESS: [name] (goes by "[name]") is a [place] woman who, at the age of 41, was referred by Dr. Coral [name] for treatment of right breast carcinoma.  The patient palpated a mass in her right breast which she brought to Dr. Verdell [name] attention. A prior screening mammogram on 02/06/2010 had been unremarkable. The patient was then scheduled for bilateral diagnostic mammogram and right breast ultrasonography on 12/22/2010, the studies demonstrating an ill defined mass with heterogeneous calcifications in the inner lower right breast, measuring 1.7 cm. The breasts are heterogeneously dense, but there were no other mammographic abnormalities in either breast. Clinically, there was a palpable firm mass at the 5:00 position of the right breast, 5 cm from the nipple. There were no palpable abnormalities in the right axilla. Ultrasound confirmed a 1.7 cm irregular, hypoechoic mass in the same position. There were some lymph nodes noted in the right exam of, one measuring 1.6 cm with a minimally thickened cortex inferiorly.  An ultrasound-guided core biopsy on 12/23/2010 [phone]) showed an invasive ductal carcinoma, grade 2, ER positive at 88%, PR positive at 11 is %, HER-2/neu positive with a FISH ratio of 4.61, and an MIB-1 of 36%.  The patient was referred to Dr. Claud [name] and bilateral breast MRIs were obtained 12/30/2010. This showed the mass in question in the right breast to measure 2.0 cm. In the right axilla there was a 1.8 cm lymph node with a thickened cortex. The left breast was unremarkable. With this information she proceeded to neoadjuvant treatment and definitive surgery as detailed below   INTERVAL HISTORY: [name] returns today for followup of her breast cancer. Today  she will receive her penultimate Herceptin treatment. She will yet to her port removed sometime late November.  REVIEW OF SYSTEMS:  Her hair is about 3 inches long which is of course much shorter than it used to be. She still likes to wear her wig. Sometimes she gets an upset stomach when she takes her pills, but this is more the actual physical pill than a side effect. Some days she is very tired, but then she does work full-time, takes care of 42-year-old and a husband, and is very busy one weekends. She sleeps okay between 11 PM and 6 AM then has to drive on hour each way to work. She has been having some sinus symptoms, which she is treating with loratadine. She is tolerating the tamoxifen without any obvious side effects, in particular no significant hot flashes, no problems with vaginal wetness, or other issues. A detailed review of systems was otherwise noncontributory  PAST MEDICAL HISTORY: Past Medical History  Diagnosis Date  . Migraines   . Asthma     as a child  . Breast cancer Sept 2012    right breast  . PONV (postoperative nausea and vomiting)   . Rash     neck  1. History of prior adenoidectomy. 2. History of rhinoplasty.   3. History of multiple uterine cervical treatments for precancerous lesions, none for the past 10 years or so. 4. History of fibroid tumor surgery. 5. History of D and C. 6. History of dysplastic nevi removed by Dr. Yetta [name]. 7. History of right "lazy eye" corrective surgery. 8. History of approximately 10-pack-years of tobacco abuse, resolved.  9.  History of possible migraines (morning headaches). History of asthma as a child.  PAST SURGICAL HISTORY: Past Surgical History  Procedure Date  . Rhinoplasty   . Adenoidectomy   . Fibroid tumor surgery   . Dilation and curettage of uterus   . Uterine cervical treatments 2002    for precancerous lesions  . Lazy eye     corrective surgery  . Mastectomy w/ sentinel node biopsy 06/29/2011    Procedure:  MASTECTOMY WITH SENTINEL LYMPH NODE BIOPSY;  Surgeon: Judith Mention, [name];  Location: [facility] OR;  Service: General;  Laterality: Right;  right skin spairing mstectomy and right sentinel lymph node biopsy  . Breast reconstruction 06/29/2011    Procedure: BREAST RECONSTRUCTION;  Surgeon: Judith Denis, [name];  Location: [facility] OR;  Service: Plastics;  Laterality: Bilateral;  Immediate Bilateral Breast Reconstruction with Bilateral Tissue Expanders and placement of  Alloderm     FAMILY HISTORY Family History  Problem Relation Age of Onset  . Adopted: Yes  . Heart attack Father   . Cancer Brother     prostate and lung  . Cancer Cousin     melanoma  . Cancer Sister     breast  . Cancer Sister     stomach, ovarian  :  The patient's biological father died at the age of 57 from myocardial infarction.  The patient's biological mother is alive at age 42.  The patient was adopted by her biological mother's parents.  She has 3 half-brothers.  No sisters.  One cousin on her mother's side died from melanoma at the age of 42.  There is other cancer history in the adopted side but these are not even half-brothers or sisters, they are children of her adopted parents who are really her grandparents so these would be uncles and aunts (1 lymph node cancer, 1 prostate cancer, 1 breast cancer at the age of 54).  GYNECOLOGIC HISTORY: She had menarche age 41 or 41.  She is still having periods, the most recent 1 being September 13.  She is to have an IUD in place.  Currently is still using oral contraceptives and she has been advised to discontinue this.  She is GX, P1, first pregnancy to term at age 20.  SOCIAL HISTORY: She works in a Agricultural consultant.  Her husband, [name] owns a family business called [name].  They have been married 5 years.  They have a son, [name], age 42.  [name] also has a daughter, [name], who lives in [place] and is 42 years old.  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  The  patient is not a church attender.    ADVANCED DIRECTIVES: Not in place  HEALTH MAINTENANCE: History  Substance Use Topics  . Smoking status: Former Smoker    Quit date: 07/20/1996  . Smokeless tobacco: Never Used  . Alcohol Use: No     Colonoscopy:  PAP: Ch. Harper  Bone density:  Lipid panel:  Allergies  Allergen Reactions  . Tussionex Pennkinetic Er (Hydrocod Polst-Cpm Polst Er) Other (See Comments)    halucinations  . Codeine Other (See Comments)    unkown  . Morphine And Related Anxiety    anxiety  . Penicillins Rash    Current Outpatient Prescriptions  Medication Sig Dispense Refill  . b complex vitamins tablet Take 1 tablet by mouth daily.      . Calcium Carbonate-Vitamin D (CALCIUM-VITAMIN D) 500-200 MG-UNIT per tablet Take 1 tablet by mouth 2 (two)  times daily with a meal.      . diazepam (VALIUM) 2 MG tablet       . fish oil-omega-3 fatty acids 1000 MG capsule Take 2 g by mouth daily.      [name] gabapentin (NEURONTIN) 100 MG capsule Take 100 mg by mouth as needed.       [name] HYDROcodone-acetaminophen (NORCO/VICODIN) 5-325 MG per tablet as needed.       [name] ibuprofen (ADVIL,MOTRIN) 800 MG tablet as needed.       . loratadine (CLARITIN) 10 MG tablet Take 10 mg by mouth daily.       [name] LORazepam (ATIVAN) 1 MG tablet TAKE ONE-HALF TABLET BY MOUTH AT BEDTIME AS NEEDED FOR ANXIETY  30 tablet  0  . Multiple Vitamins-Minerals (HAIR/SKIN/NAILS PO) Take by mouth daily.      . Omega 3-6-9 Fatty Acids (OMEGA 3-6-9 COMPLEX PO) Take by mouth daily.      [name] OVER THE COUNTER MEDICATION Take 1 tablet by mouth daily. Hair, skin, and nails vitamin      . prenatal vitamin w/FE, FA (PRENATAL 1 + 1) 27-1 MG TABS Take 1 tablet by mouth Daily.      . tamoxifen (NOLVADEX) 20 MG tablet TAKE ONE TABLET BY MOUTH EVERY DAY  30 tablet  2  . tobramycin-dexamethasone (TOBRADEX) ophthalmic solution Place 1 drop into both eyes once. As needed  5 mL  3      Filed Vitals:   01/25/12 1400  BP: 106/71    Pulse: 67  Temp: 97.9 F (36.6 C)  Resp: 20     Body mass index is 22.43 kg/(m^2).    ECOG FS: 0 Filed Weights   01/25/12 1400  Weight: 126 lb 9.6 oz (57.425 kg)    Young white female who appears well  Sclerae unicteric Oropharynx clear No cervical supraclavicular or axillary adenopathy,  Lungs no rales or rhonchi Heart regular rate and rhythm Abd benign, soft, nontender MSK no focal spinal tenderness, No peripheral edema Neuro: nonfocal Breasts:  she status post bilateral mastectomies with implant placement. The implants are sagging slightly. This is of concern to her, and she will be discussing it with her plastic surgeon    LAB RESULTS: Lab Results  Component Value Date   WBC 6.9 01/25/2012   NEUTROABS 4.4 01/25/2012   HGB 13.3 01/25/2012   HCT 38.7 01/25/2012   MCV 95.6 01/25/2012   PLT 172 01/25/2012      Chemistry      Component Value Date/Time   NA 139 11/02/2011 1357   K 3.7 11/02/2011 1357   CL 104 11/02/2011 1357   CO2 29 11/02/2011 1357   BUN 16 11/02/2011 1357   CREATININE 0.79 11/02/2011 1357      Component Value Date/Time   CALCIUM 9.2 11/02/2011 1357   ALKPHOS 57 11/02/2011 1357   AST 17 11/02/2011 1357   ALT 15 11/02/2011 1357   BILITOT 0.5 11/02/2011 1357       STUDIES: Echocardiogram 12/14/2011 showed a well preserved ejection fraction at 55 - 60 %.  ASSESSMENT: 41 y.o.  BRCA 1-2 negative [name], [place] woman,   (1) status post right breast biopsy in September 2012 for a clinically 2 cm invasive ductal carcinoma, with some enlarged Right axillary lymph nodes, the largest one of which was negative by biopsy. The tumor was grade 2, estrogen receptor positive at 88%, progesterone receptor positive at 11%, HER-2/neu positive by CISH with a ratio of 4.61, and a proliferation  marker of 36%.   (2) treated in the neoadjuvant setting with Q 3 week docetaxel/ carboplatin/ trastuzumab x6, completed 05/18/2011.  Continues on trastuzumab every 3 weeks to  complete one year.  (3) s/p bilateral mastectomies with sentinel node biopsy 06/28/1969 for a residual right-sided ypT1c ypN0 invasive ductal carcinoma, grade 2, with immediate expander placement  (4) on tamoxifen as of May 2013   PLAN:   her last trastuzumab treatment is scheduled for November 17. She can have her port removed anytime after that. We're going to start seeing her on an every 3 month basis until she reaches her 2 year mark. At that time we can [name] a CT scan of the chest and a bone scan. She understands that thereafter we would not [name] any scans except for the assessment of symptoms. She would like to have and end of treatment summary, and I will be glad to provide that for her. Otherwise she knows to call for any problems that may develop before the next visit.   Zayde Stroupe C    01/25/2012

## 2012-01-25 NOTE — Patient Instructions (Addendum)
[facility] Discharge Instructions for Patients Receiving Chemotherapy  Today you received the following chemotherapy agents; Herceptin.    BELOW ARE SYMPTOMS THAT SHOULD BE REPORTED IMMEDIATELY:  *FEVER GREATER THAN 100.5 F  *CHILLS WITH OR WITHOUT FEVER  NAUSEA AND VOMITING THAT IS NOT CONTROLLED WITH YOUR NAUSEA MEDICATION  *UNUSUAL SHORTNESS OF BREATH  *UNUSUAL BRUISING OR BLEEDING  TENDERNESS IN MOUTH AND THROAT WITH OR WITHOUT PRESENCE OF ULCERS  *URINARY PROBLEMS  *BOWEL PROBLEMS  UNUSUAL RASH Items with * indicate a potential emergency and should be followed up as soon as possible.  . Feel free to call the clinic you have any questions or concerns. The clinic phone number is [phone].   I have been informed and understand all the instructions given to me. I know to contact the clinic, my physician, or go to the Emergency Department if any problems should occur. I do not have any questions at this time, but understand that I may call the clinic during office hours   should I have any questions or need assistance in obtaining follow up care.    __________________________________________  _____________  __________ Signature of Patient or Authorized Representative            Date                   Time    __________________________________________ Nurse's Signature

## 2012-01-26 ENCOUNTER — Telehealth: Payer: Self-pay | Admitting: Oncology

## 2012-01-26 NOTE — Telephone Encounter (Signed)
Sent dana forrester a staff message for dr Derrell Lolling at [facility] for the port removal appt in nov

## 2012-01-29 ENCOUNTER — Telehealth (INDEPENDENT_AMBULATORY_CARE_PROVIDER_SITE_OTHER): Payer: Self-pay | Admitting: General Surgery

## 2012-01-29 NOTE — Telephone Encounter (Signed)
Called and left message on patient home and cell phone to call back regarding set up of port removal in late November. Need to know if patient wants local or general anesthesia for removal.

## 2012-02-06 ENCOUNTER — Telehealth: Payer: Self-pay | Admitting: *Deleted

## 2012-02-06 ENCOUNTER — Other Ambulatory Visit: Payer: Self-pay | Admitting: *Deleted

## 2012-02-06 NOTE — Telephone Encounter (Signed)
Gave patient appointment for dr,bowers office on 02-12-2012 at 2:15pm

## 2012-02-07 ENCOUNTER — Telehealth: Payer: Self-pay | Admitting: Oncology

## 2012-02-07 ENCOUNTER — Other Ambulatory Visit: Payer: Self-pay | Admitting: Oncology

## 2012-02-07 NOTE — Telephone Encounter (Signed)
Faxed pts medical records to Dr. Trellis Moment Dr. Darnelle Catalan

## 2012-02-09 ENCOUNTER — Telehealth (INDEPENDENT_AMBULATORY_CARE_PROVIDER_SITE_OTHER): Payer: Self-pay | Admitting: General Surgery

## 2012-02-09 NOTE — Telephone Encounter (Signed)
Called patient and left another message. Patient did not return call from last week. Patient to have port removed end of November (after treatment) and need to know if she wants local or general used for procedure.

## 2012-02-14 ENCOUNTER — Other Ambulatory Visit: Payer: Self-pay | Admitting: Emergency Medicine

## 2012-02-14 DIAGNOSIS — N63 Unspecified lump in unspecified breast: Secondary | ICD-10-CM

## 2012-02-15 ENCOUNTER — Other Ambulatory Visit (HOSPITAL_BASED_OUTPATIENT_CLINIC_OR_DEPARTMENT_OTHER): Payer: BC Managed Care – PPO | Admitting: Lab

## 2012-02-15 ENCOUNTER — Telehealth (INDEPENDENT_AMBULATORY_CARE_PROVIDER_SITE_OTHER): Payer: Self-pay | Admitting: General Surgery

## 2012-02-15 ENCOUNTER — Ambulatory Visit (HOSPITAL_BASED_OUTPATIENT_CLINIC_OR_DEPARTMENT_OTHER): Payer: BC Managed Care – PPO

## 2012-02-15 DIAGNOSIS — N63 Unspecified lump in unspecified breast: Secondary | ICD-10-CM

## 2012-02-15 DIAGNOSIS — C50319 Malignant neoplasm of lower-inner quadrant of unspecified female breast: Secondary | ICD-10-CM

## 2012-02-15 DIAGNOSIS — Z5112 Encounter for antineoplastic immunotherapy: Secondary | ICD-10-CM

## 2012-02-15 LAB — COMPREHENSIVE METABOLIC PANEL (CC13)
ALT: 16 U/L (ref 0–55)
AST: 17 U/L (ref 5–34)
Albumin: 4 g/dL (ref 3.5–5.0)
Alkaline Phosphatase: 50 U/L (ref 40–150)
BUN: 18 mg/dL (ref 7.0–26.0)
CO2: 29 mEq/L (ref 22–29)
Calcium: 9.7 mg/dL (ref 8.4–10.4)
Chloride: 104 mEq/L (ref 98–107)
Creatinine: 0.8 mg/dL (ref 0.6–1.1)
Glucose: 84 mg/dl (ref 70–99)
Potassium: 4 mEq/L (ref 3.5–5.1)
Sodium: 138 mEq/L (ref 136–145)
Total Bilirubin: 0.65 mg/dL (ref 0.20–1.20)
Total Protein: 6.5 g/dL (ref 6.4–8.3)

## 2012-02-15 LAB — CBC WITH DIFFERENTIAL/PLATELET
BASO%: 0.2 % (ref 0.0–2.0)
Basophils Absolute: 0 10*3/uL (ref 0.0–0.1)
EOS%: 0.6 % (ref 0.0–7.0)
Eosinophils Absolute: 0 10*3/uL (ref 0.0–0.5)
HCT: 37.1 % (ref 34.8–46.6)
HGB: 12.9 g/dL (ref 11.6–15.9)
LYMPH%: 27.8 % (ref 14.0–49.7)
MCH: 32.8 pg (ref 25.1–34.0)
MCHC: 34.6 g/dL (ref 31.5–36.0)
MCV: 94.8 fL (ref 79.5–101.0)
MONO#: 0.4 10*3/uL (ref 0.1–0.9)
MONO%: 5.7 % (ref 0.0–14.0)
NEUT#: 4.6 10*3/uL (ref 1.5–6.5)
NEUT%: 65.7 % (ref 38.4–76.8)
Platelets: 166 10*3/uL (ref 145–400)
RBC: 3.92 10*6/uL (ref 3.70–5.45)
RDW: 12.5 % (ref 11.2–14.5)
WBC: 7 10*3/uL (ref 3.9–10.3)
lymph#: 2 10*3/uL (ref 0.9–3.3)

## 2012-02-15 MED ORDER — TRASTUZUMAB CHEMO INJECTION 440 MG
6.0000 mg/kg | Freq: Once | INTRAVENOUS | Status: AC
  Administered 2012-02-15: 357 mg via INTRAVENOUS
  Filled 2012-02-15: qty 17

## 2012-02-15 MED ORDER — SODIUM CHLORIDE 0.9 % IV SOLN
Freq: Once | INTRAVENOUS | Status: AC
  Administered 2012-02-15: 14:00:00 via INTRAVENOUS

## 2012-02-15 MED ORDER — HEPARIN SOD (PORK) LOCK FLUSH 100 UNIT/ML IV SOLN
500.0000 [IU] | Freq: Once | INTRAVENOUS | Status: DC | PRN
  Filled 2012-02-15: qty 5

## 2012-02-15 MED ORDER — ACETAMINOPHEN 325 MG PO TABS: 650.0000 mg | ORAL_TABLET | Freq: Once | ORAL | Status: DC

## 2012-02-15 MED ORDER — DIPHENHYDRAMINE HCL 25 MG PO CAPS: 25.0000 mg | ORAL_CAPSULE | Freq: Once | ORAL | Status: DC

## 2012-02-15 MED ORDER — SODIUM CHLORIDE 0.9 % IJ SOLN
10.0000 mL | INTRAMUSCULAR | Status: DC | PRN
  Filled 2012-02-15: qty 10

## 2012-02-15 NOTE — Telephone Encounter (Signed)
Patient returned call based on message left last week on 02/09/12. Patient stated she would like port removed under local anesthesia. Patient stated today is her last day of treatment. Patient asked if she could put numbing medicine om her skin prior to procedure. I advised the patient not to put anything on her skin as we do not want to increase risk of infection. Advised patient her request would be forwarded to Dr. Derrell Lolling and she will be contacted when the procedure has been scheduled. Patient agreed.

## 2012-02-15 NOTE — Patient Instructions (Addendum)
[facility] Discharge Instructions for Patients Receiving Chemotherapy  Today you received the following chemotherapy agents herceptin  To help prevent nausea and vomiting after your treatment, we encourage you to take your nausea medication  and take it as often as prescribed    If you develop nausea and vomiting that is not controlled by your nausea medication, call the clinic. If it is after clinic hours your family physician or the after hours number for the clinic or go to the Emergency Department.   BELOW ARE SYMPTOMS THAT SHOULD BE REPORTED IMMEDIATELY:  *FEVER GREATER THAN 101.0 F  *CHILLS WITH OR WITHOUT FEVER  NAUSEA AND VOMITING THAT IS NOT CONTROLLED WITH YOUR NAUSEA MEDICATION  *UNUSUAL SHORTNESS OF BREATH  *UNUSUAL BRUISING OR BLEEDING  TENDERNESS IN MOUTH AND THROAT WITH OR WITHOUT PRESENCE OF ULCERS  *URINARY PROBLEMS  *BOWEL PROBLEMS  UNUSUAL RASH Items with * indicate a potential emergency and should be followed up as soon as possible.  One of the nurses will contact you 24 hours after your treatment. Please let the nurse know about any problems that you may have experienced. Feel free to call the clinic you have any questions or concerns. The clinic phone number is [phone].   I have been informed and understand all the instructions given to me. I know to contact the clinic, my physician, or go to the Emergency Department if any problems should occur. I do not have any questions at this time, but understand that I may call the clinic during office hours or the Patient Navigator at [phone] should I have any questions or need assistance in obtaining follow up care.    __________________________________________  _____________  __________ Signature of Patient or Authorized Representative            Date                   Time    __________________________________________ Nurse's Signature

## 2012-02-23 ENCOUNTER — Telehealth (INDEPENDENT_AMBULATORY_CARE_PROVIDER_SITE_OTHER): Payer: Self-pay

## 2012-02-23 ENCOUNTER — Other Ambulatory Visit (INDEPENDENT_AMBULATORY_CARE_PROVIDER_SITE_OTHER): Payer: Self-pay | Admitting: General Surgery

## 2012-02-23 NOTE — Telephone Encounter (Signed)
The pt called because she has been waiting to schedule her portacath removal.  She has called several times and not a call back.  I reviewed her chart and made sure there were no orders in there.  The pt has decided to have it under local.  I paged Dr Derrell Lolling and he gave the ok.  I got the posting sheet written out.  I sent him a message to put in the orders.  I called the pt and let her know where things stand.  I told her to stop her Excedrin a week ahead of time.  I sent the posting sheet to Debbie in scheduling.

## 2012-03-06 NOTE — Progress Notes (Signed)
Coming to get pac out local-light breakfast-take rx meds-arrive 1330

## 2012-03-08 NOTE — H&P (Signed)
    [name]    MRN: [id number]   Description: 41 year old female  Provider: [name], MD  Department: [facility]        Diagnoses     Invasive ductal carcinoma of breast, stage 2   - Primary    174.9         Vitals   BP Pulse Temp Resp Ht Wt    98/66 68 97.3 F (36.3 C) (Temporal) 16 5\' 3"  (1.6 m) 124 lb 6 oz (56.416 kg)    BMI - 22.03 kg/m2 03/11/2011               History and Physical     [name], MD   Patient ID: [name], female   DOB: April 05, 1971, 41 y.o..   MRN: [id number]         History: This patient returns for long-term followup regarding her right breast cancer. She presented with locally advanced cancer of the right breast. She underwent neoadjuvant chemotherapy. On 06/29/2011 she underwent right total mastectomy and sentinel node biopsy and left total mastectomy and tissue expanders. The right breast revealed invasive ductal carcinoma, ER-positive, HER-2 positive. Pathologic stage ypT1c, yp N0. BRCA testing was negative. On 11/23/2011 Dr. Kelly Splinter removed her expanders and replaced with implants. She has recovered from that surgery without complications. She has  completed Herceptin chemotherapy and wants to get her Port-A-Cath out. I told her to call my office to set that up.   ROS: 10 system review of systems is performed and is negative except as described above.   Exam: Patient looks well. No distress. Neck without adenopathy Lungs: clear to auscultation bilaterally Heart: RRR without ectomy Bilateral mastectomy skin flaps are healthy. No nodules ulcerations or adenopathy. Implants in place and are symmetrical with good cosmetic result. She is a little bit of wrinkling when she is correct but not in supine.   Assessment: locally advanced cancer right breast, status post neoadjuvant chemotherapy and subsequent bilateral mastectomy and right sentinel node biopsy. Invasive ductal carcinoma, receptor positive,  HER-2 positive. YpT1c, ypN0.  BRCA negative  Status post recent removal of tissue expanders and replacement with permanent implants.   Plan:  She has completed her chemotherapy and has been cleared to have her port removed by Dr. Darnelle Catalan. We will do this under local anesthesia or under sedation depending on  her preference  Otherwise, Return to see me in March 2014 for physical exam.     Angelia Mould. Derrell Lolling, M.D., [facility] General and Minimally invasive Surgery Breast and Colorectal Surgery Office:   [phone] Pager:   [phone]

## 2012-03-12 ENCOUNTER — Ambulatory Visit (HOSPITAL_BASED_OUTPATIENT_CLINIC_OR_DEPARTMENT_OTHER)
Admission: RE | Admit: 2012-03-12 | Discharge: 2012-03-12 | Disposition: A | Discharge status: Home / Self Care | Payer: BC Managed Care – PPO | Source: Ambulatory Visit | Attending: General Surgery | Admitting: General Surgery

## 2012-03-12 ENCOUNTER — Encounter (HOSPITAL_BASED_OUTPATIENT_CLINIC_OR_DEPARTMENT_OTHER)
Admission: RE | Disposition: A | Discharge status: Home / Self Care | Payer: Self-pay | Source: Ambulatory Visit | Attending: General Surgery

## 2012-03-12 DIAGNOSIS — C50919 Malignant neoplasm of unspecified site of unspecified female breast: Secondary | ICD-10-CM | POA: Diagnosis present

## 2012-03-12 DIAGNOSIS — Z452 Encounter for adjustment and management of vascular access device: Secondary | ICD-10-CM

## 2012-03-12 HISTORY — PX: PORT-A-CATH REMOVAL: SHX5289

## 2012-03-12 SURGERY — MINOR REMOVAL PORT-A-CATH
Anesthesia: LOCAL | Site: Chest | Wound class: Clean

## 2012-03-12 MED ORDER — SODIUM BICARBONATE 4 % IV SOLN
INTRAVENOUS | Status: DC | PRN
  Administered 2012-03-12: 15:00:00 via INTRAMUSCULAR

## 2012-03-12 MED ORDER — CHLORHEXIDINE GLUCONATE 4 % EX LIQD: 1.0000 "application " | Freq: Once | CUTANEOUS | Status: DC

## 2012-03-12 MED ORDER — LIDOCAINE-EPINEPHRINE (PF) 1 %-1:200000 IJ SOLN: INTRAMUSCULAR | Status: DC | PRN

## 2012-03-12 MED ORDER — HYDROCODONE-ACETAMINOPHEN 5-325 MG PO TABS: 1.0000 | ORAL_TABLET | ORAL | Status: DC | PRN

## 2012-03-12 SURGICAL SUPPLY — 33 items
ADH SKN CLS APL DERMABOND .7 (GAUZE/BANDAGES/DRESSINGS) ×1
APL SKNCLS STERI-STRIP NONHPOA (GAUZE/BANDAGES/DRESSINGS)
BENZOIN TINCTURE PRP APPL 2/3 (GAUZE/BANDAGES/DRESSINGS) IMPLANT
BLADE SURG 15 STRL LF DISP TIS (BLADE) ×1 IMPLANT
BLADE SURG 15 STRL SS (BLADE) ×2
CLOTH BEACON ORANGE TIMEOUT ST (SAFETY) ×2 IMPLANT
DERMABOND ADVANCED (GAUZE/BANDAGES/DRESSINGS) ×1
DERMABOND ADVANCED .7 DNX12 (GAUZE/BANDAGES/DRESSINGS) IMPLANT
DRAPE SURG 17X23 STRL (DRAPES) IMPLANT
ELECT REM PT RETURN 9FT ADLT (ELECTROSURGICAL)
ELECTRODE REM PT RTRN 9FT ADLT (ELECTROSURGICAL) IMPLANT
GAUZE SPONGE 4X4 12PLY STRL LF (GAUZE/BANDAGES/DRESSINGS) IMPLANT
GLOVE EUDERMIC 7 POWDERFREE (GLOVE) ×2 IMPLANT
MARKER SKIN DUAL TIP RULER LAB (MISCELLANEOUS) ×2 IMPLANT
NDL HYPO 25X1 1.5 SAFETY (NEEDLE) ×1 IMPLANT
NDL SAFETY ECLIPSE 18X1.5 (NEEDLE) ×1 IMPLANT
NEEDLE HYPO 18GX1.5 SHARP (NEEDLE) ×2
NEEDLE HYPO 25X1 1.5 SAFETY (NEEDLE) ×2 IMPLANT
NS IRRIG 1000ML POUR BTL (IV SOLUTION) IMPLANT
PENCIL BUTTON HOLSTER BLD 10FT (ELECTRODE) IMPLANT
STRIP CLOSURE SKIN 1/2X4 (GAUZE/BANDAGES/DRESSINGS) IMPLANT
SUT MNCRL AB 3-0 PS2 18 (SUTURE) IMPLANT
SUT MON AB 4-0 PC3 18 (SUTURE) ×1 IMPLANT
SUT VIC AB 3-0 SH 27 (SUTURE) ×2
SUT VIC AB 3-0 SH 27X BRD (SUTURE) IMPLANT
SUT VIC AB 4-0 P-3 18XBRD (SUTURE) IMPLANT
SUT VIC AB 4-0 P3 18 (SUTURE)
SUT VIC AB 5-0 P-3 18X BRD (SUTURE) IMPLANT
SUT VIC AB 5-0 P3 18 (SUTURE)
SUT VICRYL 3-0 CR8 SH (SUTURE) IMPLANT
SWABSTICK POVIDONE IODINE SNGL (MISCELLANEOUS) ×2 IMPLANT
SYR CONTROL 10ML LL (SYRINGE) ×2 IMPLANT
TOWEL OR 17X24 6PK STRL BLUE (TOWEL DISPOSABLE) IMPLANT

## 2012-03-12 NOTE — Op Note (Signed)
Patient Name:           [name]   Date of Surgery:        03/12/2012  Pre op Diagnosis:   Invasive cancer right breast     Post op Diagnosis:    Same  Procedure:                 Removal of Port-A-Cath  Surgeon:                     Angelia Mould. Derrell Lolling, M.D., FACS  Assistant:                      None  Operative Indications:   This patient returns for port a cath removal.   She initially presented with locally advanced cancer of the right breast. She underwent neoadjuvant chemotherapy. On 06/29/2011 she underwent right total mastectomy and sentinel node biopsy and left total mastectomy and tissue expanders. The right breast revealed invasive ductal carcinoma, ER-positive, HER-2 positive. Pathologic stage ypT1c, yp N0.  BRCA testing was negative.  On 11/23/2011 Dr. Kelly Splinter removed her expanders and replaced with implants. She has recovered from that surgery without complications.  She has completed Herceptin chemotherapy and wants to get her Port-A-Cath removed.   Operative Findings:       The port and catheter were removed intact. There was no sign of infection or bleeding.  Procedure in Detail:          The patient was brought to room 5 at [facility], and the procedure was done under local anesthesia. Surgical time out was performed. The Port-A-Cath was observed in the left infraclavicular area. This area was prepped and draped in sterile fashion. 1% Xylocaine with epinephrine was used as local infiltration anesthetic. Transverse incision was made overlying the port, through the previous scar. Dissection was carried down through the subcutaneous tissue incising the capsule around the port. The port was elevated. The Prolene sutures were cut and removed. The port and catheter were removed intact. There was no bleeding. The subcutaneous tissues were closed with 3-0 Vicryl sutures and the skin closed with a running subcuticular suture of 4-0 Monocryl and Dermabond. There were no  complications. She tolerated this well. EBL 5 cc. Counts correct.     Angelia Mould. Derrell Lolling, M.D., FACS General and Minimally Invasive Surgery Breast and Colorectal Surgery  03/12/2012 2:52 PM

## 2012-03-12 NOTE — Interval H&P Note (Signed)
History and Physical Interval Note:  03/12/2012 2:13 PM  [name]  has presented today for surgery, with the diagnosis of un-needed port  The goals and the various methods of treatment have been discussed with the patient and family. After consideration of risks, benefits and other options for treatment, the patient has consented to  Procedure(s) (LRB) with comments: MINOR REMOVAL PORT-A-CATH (N/A) as a surgical intervention .  The patient's history has been reviewed, patient examined, no change in status, stable for surgery.  I have reviewed the patient's chart and labs.  Questions were answered to the patient's satisfaction.     [name]

## 2012-03-13 ENCOUNTER — Encounter (HOSPITAL_BASED_OUTPATIENT_CLINIC_OR_DEPARTMENT_OTHER): Payer: Self-pay | Admitting: General Surgery

## 2012-03-22 ENCOUNTER — Other Ambulatory Visit: Payer: Self-pay | Admitting: Physician Assistant

## 2012-03-22 DIAGNOSIS — C50919 Malignant neoplasm of unspecified site of unspecified female breast: Secondary | ICD-10-CM

## 2012-03-25 ENCOUNTER — Ambulatory Visit (HOSPITAL_BASED_OUTPATIENT_CLINIC_OR_DEPARTMENT_OTHER)
Admission: RE | Admit: 2012-03-25 | Discharge: 2012-03-25 | Disposition: A | Discharge status: Home / Self Care | Payer: BC Managed Care – PPO | Source: Ambulatory Visit | Attending: Internal Medicine | Admitting: Internal Medicine

## 2012-03-25 ENCOUNTER — Ambulatory Visit (HOSPITAL_BASED_OUTPATIENT_CLINIC_OR_DEPARTMENT_OTHER): Payer: BC Managed Care – PPO | Admitting: Physician Assistant

## 2012-03-25 ENCOUNTER — Ambulatory Visit (HOSPITAL_COMMUNITY)
Admission: RE | Admit: 2012-03-25 | Discharge: 2012-03-25 | Disposition: A | Discharge status: Home / Self Care | Payer: BC Managed Care – PPO | Source: Ambulatory Visit | Attending: Internal Medicine | Admitting: Internal Medicine

## 2012-03-25 ENCOUNTER — Encounter: Payer: Self-pay | Admitting: Physician Assistant

## 2012-03-25 ENCOUNTER — Other Ambulatory Visit (HOSPITAL_BASED_OUTPATIENT_CLINIC_OR_DEPARTMENT_OTHER): Payer: BC Managed Care – PPO | Admitting: Lab

## 2012-03-25 ENCOUNTER — Telehealth: Payer: Self-pay | Admitting: *Deleted

## 2012-03-25 ENCOUNTER — Ambulatory Visit
Admission: RE | Admit: 2012-03-25 | Discharge: 2012-03-25 | Disposition: A | Discharge status: Home / Self Care | Payer: BC Managed Care – PPO | Source: Ambulatory Visit | Attending: Physician Assistant | Admitting: Physician Assistant

## 2012-03-25 VITALS — BP 125/72 | HR 69 | Temp 98.7°F | Resp 20 | Ht 63.0 in | Wt 130.4 lb

## 2012-03-25 VITALS — BP 104/68 | HR 65 | Wt 130.0 lb

## 2012-03-25 DIAGNOSIS — C50919 Malignant neoplasm of unspecified site of unspecified female breast: Secondary | ICD-10-CM

## 2012-03-25 DIAGNOSIS — R599 Enlarged lymph nodes, unspecified: Secondary | ICD-10-CM

## 2012-03-25 DIAGNOSIS — C50319 Malignant neoplasm of lower-inner quadrant of unspecified female breast: Secondary | ICD-10-CM

## 2012-03-25 DIAGNOSIS — R2230 Localized swelling, mass and lump, unspecified upper limb: Secondary | ICD-10-CM

## 2012-03-25 DIAGNOSIS — Z853 Personal history of malignant neoplasm of breast: Secondary | ICD-10-CM

## 2012-03-25 DIAGNOSIS — Z9010 Acquired absence of unspecified breast and nipple: Secondary | ICD-10-CM | POA: Insufficient documentation

## 2012-03-25 DIAGNOSIS — Z9013 Acquired absence of bilateral breasts and nipples: Secondary | ICD-10-CM

## 2012-03-25 DIAGNOSIS — D4989 Neoplasm of unspecified behavior of other specified sites: Secondary | ICD-10-CM

## 2012-03-25 DIAGNOSIS — Z170 Estrogen receptor positive status [ER+]: Secondary | ICD-10-CM

## 2012-03-25 LAB — COMPREHENSIVE METABOLIC PANEL (CC13)
ALT: 20 U/L (ref 0–55)
AST: 21 U/L (ref 5–34)
Albumin: 3.5 g/dL (ref 3.5–5.0)
Alkaline Phosphatase: 53 U/L (ref 40–150)
BUN: 14 mg/dL (ref 7.0–26.0)
CO2: 29 mEq/L (ref 22–29)
Calcium: 9.4 mg/dL (ref 8.4–10.4)
Chloride: 104 mEq/L (ref 98–107)
Creatinine: 0.8 mg/dL (ref 0.6–1.1)
Glucose: 79 mg/dl (ref 70–99)
Potassium: 3.9 mEq/L (ref 3.5–5.1)
Sodium: 140 mEq/L (ref 136–145)
Total Bilirubin: 0.53 mg/dL (ref 0.20–1.20)
Total Protein: 6 g/dL — ABNORMAL LOW (ref 6.4–8.3)

## 2012-03-25 LAB — LUTEINIZING HORMONE: LH: 11.5 m[IU]/mL

## 2012-03-25 LAB — CBC WITH DIFFERENTIAL/PLATELET
BASO%: 0.3 % (ref 0.0–2.0)
Basophils Absolute: 0 10*3/uL (ref 0.0–0.1)
EOS%: 0.9 % (ref 0.0–7.0)
Eosinophils Absolute: 0.1 10*3/uL (ref 0.0–0.5)
HCT: 36.9 % (ref 34.8–46.6)
HGB: 12.8 g/dL (ref 11.6–15.9)
LYMPH%: 37.9 % (ref 14.0–49.7)
MCH: 33.2 pg (ref 25.1–34.0)
MCHC: 34.7 g/dL (ref 31.5–36.0)
MCV: 95.7 fL (ref 79.5–101.0)
MONO#: 0.4 10*3/uL (ref 0.1–0.9)
MONO%: 7.1 % (ref 0.0–14.0)
NEUT#: 3.1 10*3/uL (ref 1.5–6.5)
NEUT%: 53.8 % (ref 38.4–76.8)
Platelets: 189 10*3/uL (ref 145–400)
RBC: 3.85 10*6/uL (ref 3.70–5.45)
RDW: 12.9 % (ref 11.2–14.5)
WBC: 5.8 10*3/uL (ref 3.9–10.3)
lymph#: 2.2 10*3/uL (ref 0.9–3.3)

## 2012-03-25 LAB — FOLLICLE STIMULATING HORMONE: FSH: 22.9 m[IU]/mL

## 2012-03-25 NOTE — Progress Notes (Signed)
  Echocardiogram 2D Echocardiogram has been performed.  [name] 03/25/2012, 12:51 PM

## 2012-03-25 NOTE — Progress Notes (Signed)
General Surgeon: [name] Plastic Surgeon: [name] Oncologist: [name]  HPI: [name] is a 42 yo with recently diagnosed (12/22/2010) right invasive ductal carcinoma, grade 2, estrogen receptor positive, progesterone receptor positive and Her2 amplification. She was started on carboplatin and docetaxel together with Herceptin every 3 weeks x6 (first dose last week) then Herceptin is to be continued for a total of 1 year. She was referred to Judith. Gala Romney for close monitoring for cardiotoxicity while on herceptin.   Echos:  01/19/11: EF 50-55%. Lateral S' velocity 9.2 cm/s  05/26/11: EF 55%. Lateral s' velocity 9.4 to 10.5 cm/s  09/18/11 EF 55% lat s' -> poor window, unable to determine  12/14/11 EF 55-60% 9.1 03/25/12 EF 55%, lat s' 10.8  06/30/11 S/P bilateral mastectomies and placement of tissue expanders  She returns for follow up today.  Last herceptin therapy 02/25/12.  Tamoxifen for 5 years.  She feels well.  Denies edema, orthopnea, or PND.  Port removed.     ROS: All systems negative except as listed in HPI, PMH and Problem List.  Past Medical History  Diagnosis Date  . Migraines   . Asthma     as a child  . Breast cancer Sept 2012    right breast  . PONV (postoperative nausea and vomiting)   . Rash     neck    Current Outpatient Prescriptions  Medication Sig Dispense Refill  . b complex vitamins tablet Take 1 tablet by mouth daily.      . Calcium Carbonate-Vitamin D (CALCIUM-VITAMIN D) 500-200 MG-UNIT per tablet Take 1 tablet by mouth 2 (two) times daily with a meal.      . doxycycline (DORYX) 100 MG EC tablet Take 100 mg by mouth 2 (two) times daily.      . fish oil-omega-3 fatty acids 1000 MG capsule Take 2 g by mouth daily.      [name] loratadine (CLARITIN) 10 MG tablet Take 10 mg by mouth daily.       [name] LORazepam (ATIVAN) 1 MG tablet TAKE ONE-HALF TABLET BY MOUTH AT BEDTIME AS NEEDED FOR ANXIETY  30 tablet  0  . Multiple Vitamins-Minerals (HAIR/SKIN/NAILS PO)  Take by mouth daily.      . prenatal vitamin w/FE, FA (PRENATAL 1 + 1) 27-1 MG TABS Take 1 tablet by mouth Daily.      . tamoxifen (NOLVADEX) 20 MG tablet TAKE ONE TABLET BY MOUTH EVERY DAY  30 tablet  2     PHYSICAL EXAM: Filed Vitals:   03/25/12 1253  BP: 104/68  Pulse: 65  Weight: 130 lb (58.968 kg)  SpO2: 100%    General:  Well appearing. No resp difficulty HEENT: normal Neck: supple. JVP flat. Carotids 2+ bilaterally; no bruits. No lymphadenopathy or thryomegaly appreciated. Cor: PMI normal. Regular rate & rhythm. No rubs, gallops or murmurs. L upper chest porta cath Lungs: clear Abdomen: soft, nontender, nondistended. No hepatosplenomegaly. No bruits or masses. Good bowel sounds. Extremities: no cyanosis, clubbing, rash, edema Neuro: alert & orientedx3, cranial nerves grossly intact. Moves all 4 extremities w/o difficulty. Affect pleasant.    ASSESSMENT & PLAN:

## 2012-03-25 NOTE — Assessment & Plan Note (Addendum)
Ms. Awe has completed her herceptin therapy without evidence of cardiotoxicity per echo today.  She has been discharged from the cardio-onc clinic and can follow up as needed.   Patient seen and examined with Ulyess Blossom, PA-C. We discussed all aspects of the encounter. I agree with the assessment and plan as stated above. I reviewed echos personally. EF and Doppler parameters stable. No HF on exam. She has finished chemo. Can f/u prn.

## 2012-03-25 NOTE — Progress Notes (Signed)
ID: [name]   DOB: October 13, 1970  MR#: [id number]  AOZ#:[id number]   PCP: Provider Not In System GYN: Coral Ceo MD SU: Claud Kelp MD OTHER MD: Wayland Denis  HISTORY OF PRESENT ILLNESS: [name] (goes by "[name]") is a [place] woman who, at the age of 41, was referred by Dr. Coral Ceo for treatment of right breast carcinoma.  The patient palpated a mass in her right breast which she brought to Dr. Verdell Carmine attention. A prior screening mammogram on 02/06/2010 had been unremarkable. The patient was then scheduled for bilateral diagnostic mammogram and right breast ultrasonography on 12/22/2010, the studies demonstrating an ill defined mass with heterogeneous calcifications in the inner lower right breast, measuring 1.7 cm. The breasts are heterogeneously dense, but there were no other mammographic abnormalities in either breast. Clinically, there was a palpable firm mass at the 5:00 position of the right breast, 5 cm from the nipple. There were no palpable abnormalities in the right axilla. Ultrasound confirmed a 1.7 cm irregular, hypoechoic mass in the same position. There were some lymph nodes noted in the right exam of, one measuring 1.6 cm with a minimally thickened cortex inferiorly.  An ultrasound-guided core biopsy on 12/23/2010 [id number]) showed an invasive ductal carcinoma, grade 2, ER positive at 88%, PR positive at 11 is %, HER-2/neu positive with a FISH ratio of 4.61, and an MIB-1 of 36%.  The patient was referred to Dr. Claud Kelp and bilateral breast MRIs were obtained 12/30/2010. This showed the mass in question in the right breast to measure 2.0 cm. In the right axilla there was a 1.8 cm lymph node with a thickened cortex. The left breast was unremarkable. With this information she proceeded to neoadjuvant treatment and definitive surgery as detailed below   INTERVAL HISTORY: Mishel returns today accompanied by her husband Council Mechanic for followup  of her right breast cancer. She continues on tamoxifen which she is tolerating well.  Interval history is remarkable for [name] having received her last dose of trastuzumab on 02/15/2012.  She had her port removed by Dr. Derrell Lolling in early December with no complications.  3 continues to work full-time, and overall is doing quite well. She continues to have fatigue at times. She has some lower back pain which comes and goes. She is worried about a "lump" she feels in her left axilla.  REVIEW OF SYSTEMS:  [name] has had no recent fevers or chills. She's had only occasional hot flashes, nothing particularly problematic. It has now been approximately one year since her last menstrual cycle. She's eating and drinking well with no nausea or change in bowel habits. No cough, increased shortness of breath, or chest pain. She denies any abnormal headaches, change in vision, or dizziness. She's had no peripheral swelling.   A detailed review of systems was otherwise noncontributory  PAST MEDICAL HISTORY: Past Medical History  Diagnosis Date  . Migraines   . Asthma     as a child  . Breast cancer Sept 2012    right breast  . PONV (postoperative nausea and vomiting)   . Rash     neck  1. History of prior adenoidectomy. 2. History of rhinoplasty.   3. History of multiple uterine cervical treatments for precancerous lesions, none for the past 10 years or so. 4. History of fibroid tumor surgery. 5. History of D and C. 6. History of dysplastic nevi removed by Dr. Yetta Barre. 7. History of right "lazy eye" corrective surgery. 8. History of  approximately 10-pack-years of tobacco abuse, resolved.  9. History of possible migraines (morning headaches). History of asthma as a child.  PAST SURGICAL HISTORY: Past Surgical History  Procedure Date  . Rhinoplasty   . Adenoidectomy   . Fibroid tumor surgery   . Dilation and curettage of uterus   . Uterine cervical treatments 2002    for precancerous lesions  .  Lazy eye     corrective surgery  . Mastectomy w/ sentinel node biopsy 06/29/2011    Procedure: MASTECTOMY WITH SENTINEL LYMPH NODE BIOPSY;  Surgeon: [name], MD;  Location: [facility] OR;  Service: General;  Laterality: Right;  right skin spairing mstectomy and right sentinel lymph node biopsy  . Breast reconstruction 06/29/2011    Procedure: BREAST RECONSTRUCTION;  Surgeon: Wayland Denis, DO;  Location: [facility] OR;  Service: Plastics;  Laterality: Bilateral;  Immediate Bilateral Breast Reconstruction with Bilateral Tissue Expanders and placement of  Alloderm   . Port-a-cath removal 03/12/2012    Procedure: MINOR REMOVAL PORT-A-CATH;  Surgeon: [name], MD;  Location: [facility];  Service: General;  Laterality: N/A;  Upper left    FAMILY HISTORY Family History  Problem Relation Age of Onset  . Adopted: Yes  . Heart attack Father   . Cancer Brother     prostate and lung  . Cancer Cousin     melanoma  . Cancer Sister     breast  . Cancer Sister     stomach, ovarian  :  The patient's biological father died at the age of 51 from myocardial infarction.  The patient's biological mother is alive at age 17.  The patient was adopted by her biological mother's parents.  She has 3 half-brothers.  No sisters.  One cousin on her mother's side died from melanoma at the age of 36.  There is other cancer history in the adopted side but these are not even half-brothers or sisters, they are children of her adopted parents who are really her grandparents so these would be uncles and aunts (1 lymph node cancer, 1 prostate cancer, 1 breast cancer at the age of 66).  GYNECOLOGIC HISTORY: She had menarche age 41 or 41.  She is still having periods, the most recent 1 being September 13.  She is to have an IUD in place.  Currently is still using oral contraceptives and she has been advised to discontinue this.  She is GX, P1, first pregnancy to term at age 27.  SOCIAL HISTORY: She works in a  Agricultural consultant.  Her husband, Ellysa Parrack owns a family business called [employer].  They have been married 5 years.  They have a son, [name], age 94.  Council Mechanic also has a daughter, Leler Brion, who lives in [place] and is 42 years old.  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  The patient is not a church attender.    ADVANCED DIRECTIVES: Not in place  HEALTH MAINTENANCE: History  Substance Use Topics  . Smoking status: Former Smoker    Quit date: 07/20/1996  . Smokeless tobacco: Never Used  . Alcohol Use: No     Colonoscopy:  PAP: Ch. Harper  Bone density:  Lipid panel:  Allergies  Allergen Reactions  . Tussionex Pennkinetic Er (Hydrocod Polst-Cpm Polst Er) Other (See Comments)    halucinations  . Codeine Other (See Comments)    unkown  . Morphine And Related Anxiety    anxiety  . Penicillins Rash    Current  Outpatient Prescriptions  Medication Sig Dispense Refill  . b complex vitamins tablet Take 1 tablet by mouth daily.      . Calcium Carbonate-Vitamin D (CALCIUM-VITAMIN D) 500-200 MG-UNIT per tablet Take 1 tablet by mouth 2 (two) times daily with a meal.      . doxycycline (DORYX) 100 MG EC tablet Take 100 mg by mouth 2 (two) times daily.      . fish oil-omega-3 fatty acids 1000 MG capsule Take 2 g by mouth daily.      [name] loratadine (CLARITIN) 10 MG tablet Take 10 mg by mouth daily.       [name] LORazepam (ATIVAN) 1 MG tablet TAKE ONE-HALF TABLET BY MOUTH AT BEDTIME AS NEEDED FOR ANXIETY  30 tablet  0  . Multiple Vitamins-Minerals (HAIR/SKIN/NAILS PO) Take by mouth daily.      . prenatal vitamin w/FE, FA (PRENATAL 1 + 1) 27-1 MG TABS Take 1 tablet by mouth Daily.      . tamoxifen (NOLVADEX) 20 MG tablet TAKE ONE TABLET BY MOUTH EVERY DAY  30 tablet  2      Filed Vitals:   03/25/12 1353  BP: 125/72  Pulse: 69  Temp: 98.7 F (37.1 C)  Resp: 20     Body mass index is 23.10 kg/(m^2).    ECOG FS: 0 Filed Weights   03/25/12 1353  Weight: 130 lb 6.4 oz (59.149  kg)    Young white female who appears well  Sclerae unicteric Oropharynx clear No cervical supraclavicular or axillary adenopathy,  Lungs no rales or rhonchi Heart regular rate and rhythm Abd benign, soft, nontender with positive bowel sounds MSK no focal spinal tenderness, No peripheral edema Neuro: nonfocal , alert and oriented x3 Breasts:  she status post bilateral mastectomies with implant placement. The implants are sagging slightly with "wrinkles" noted in the upper portion of the breasts bilaterally. There is a small palpable abnormality, tubular in shape, in the left axilla, which actually feels like scar tissue.  LAB RESULTS: Lab Results  Component Value Date   WBC 5.8 03/25/2012   NEUTROABS 3.1 03/25/2012   HGB 12.8 03/25/2012   HCT 36.9 03/25/2012   MCV 95.7 03/25/2012   PLT 189 03/25/2012      Chemistry      Component Value Date/Time   NA 138 02/15/2012 1300   NA 139 11/02/2011 1357   K 4.0 02/15/2012 1300   K 3.7 11/02/2011 1357   CL 104 02/15/2012 1300   CL 104 11/02/2011 1357   CO2 29 02/15/2012 1300   CO2 29 11/02/2011 1357   BUN 18.0 02/15/2012 1300   BUN 16 11/02/2011 1357   CREATININE 0.8 02/15/2012 1300   CREATININE 0.79 11/02/2011 1357      Component Value Date/Time   CALCIUM 9.7 02/15/2012 1300   CALCIUM 9.2 11/02/2011 1357   ALKPHOS 50 02/15/2012 1300   ALKPHOS 57 11/02/2011 1357   AST 17 02/15/2012 1300   AST 17 11/02/2011 1357   ALT 16 02/15/2012 1300   ALT 15 11/02/2011 1357   BILITOT 0.65 02/15/2012 1300   BILITOT 0.5 11/02/2011 1357       STUDIES: Echocardiogram 03/25/2012 showed a well preserved ejection fraction at 55 - 60 %.  ASSESSMENT: 41 y.o.  BRCA 1-2 negative [name], [place] woman,   (1) status post right breast biopsy in September 2012 for a clinically 2 cm invasive ductal carcinoma, with some enlarged Right axillary lymph nodes, the largest one of which was negative  by biopsy. The tumor was grade 2, estrogen receptor positive at 88%,  progesterone receptor positive at 11%, HER-2/neu positive by CISH with a ratio of 4.61, and a proliferation marker of 36%.   (2) treated in the neoadjuvant setting with Q 3 week docetaxel/ carboplatin/ trastuzumab x6, completed 05/18/2011.  Continued on trastuzumab every 3 weeks to complete one year, last dose in November 2013.  (3) s/p bilateral mastectomies with sentinel node biopsy 06/28/1969 for a residual right-sided ypT1c ypN0 invasive ductal carcinoma, grade 2, with immediate expander placement  (4) on tamoxifen as of May 2013   PLAN:  This case was reviewed with Dr. Darnelle Catalan who also examined the patient today. Jawanna will continue on tamoxifen as before, 20 mg daily. We're going to draw labs today to check her hormone levels, specifically her estradiol, LH, and FSH, to check her menopausal status. We're also referring her to the breast Center for an ultrasound of the left axilla to evaluate the palpable abnormality.  Otherwise, our plan will be to continue seeing [facility] on an every 3 month basis until she reaches her 2 year mark. At that time we can do a CT scan of the chest and a bone scan. She understands that thereafter we would not do any scans except for the assessment of symptoms.  Otherwise she knows to call for any problems that may develop before the next visit.   Mutasim Tuckey    03/25/2012

## 2012-03-25 NOTE — Telephone Encounter (Signed)
Sent patient back to the lab on 03-25-2012  Made patient appointment for 03-25-2012 for the ultrasound at the breast center  Made patient appointment for 06-2012 patient works in [employer] requested to do the labs the same day

## 2012-04-09 LAB — ESTRADIOL, ULTRA SENS: Estradiol, Ultra Sensitive: 823 pg/mL

## 2012-04-25 ENCOUNTER — Telehealth: Payer: Self-pay | Admitting: *Deleted

## 2012-04-25 NOTE — Telephone Encounter (Signed)
This RN spoke with pt per labs showing pt as premenopausal. Discussed with pt potential for pregnancy and need for use of barrier methods of birth control.   [name] stated she is having hot flashes and inquired cause - this RN informed her hot flashes are a side effect of tamoxifen and should dissipate over the next few months.  She states she has also not had an period since she had chemotherapy.  This RN reiterated with pt potential for pregnancy if no birth control used even if she is not having periods as well her periods may resume at some point.  [name] inquired about " pill for birth control mentioned by Amy when I saw her that would be okay for me to take "  This RN informed pt above inquiry would be given to AB/PA.  This note will be given to AB/PA per inquiry.

## 2012-04-30 ENCOUNTER — Ambulatory Visit: Payer: BC Managed Care – PPO | Admitting: Physician Assistant

## 2012-04-30 ENCOUNTER — Other Ambulatory Visit: Payer: BC Managed Care – PPO | Admitting: Lab

## 2012-05-05 ENCOUNTER — Other Ambulatory Visit: Payer: Self-pay | Admitting: Oncology

## 2012-05-06 ENCOUNTER — Other Ambulatory Visit: Payer: Self-pay | Admitting: Oncology

## 2012-05-06 MED ORDER — LORAZEPAM 1 MG PO TABS: ORAL_TABLET | ORAL | Status: DC

## 2012-05-06 NOTE — Addendum Note (Signed)
Addended by: Laroy Apple E on: 05/06/2012 11:39 AM   Modules accepted: Orders

## 2012-06-04 ENCOUNTER — Other Ambulatory Visit: Payer: Self-pay | Admitting: Oncology

## 2012-06-24 ENCOUNTER — Other Ambulatory Visit (HOSPITAL_BASED_OUTPATIENT_CLINIC_OR_DEPARTMENT_OTHER): Payer: BC Managed Care – PPO | Admitting: Lab

## 2012-06-24 ENCOUNTER — Telehealth: Payer: Self-pay | Admitting: *Deleted

## 2012-06-24 ENCOUNTER — Ambulatory Visit (HOSPITAL_BASED_OUTPATIENT_CLINIC_OR_DEPARTMENT_OTHER): Payer: BC Managed Care – PPO | Admitting: Oncology

## 2012-06-24 VITALS — BP 98/64 | HR 64 | Temp 98.1°F | Resp 20 | Ht 63.0 in | Wt 126.3 lb

## 2012-06-24 DIAGNOSIS — C50919 Malignant neoplasm of unspecified site of unspecified female breast: Secondary | ICD-10-CM

## 2012-06-24 DIAGNOSIS — Z170 Estrogen receptor positive status [ER+]: Secondary | ICD-10-CM

## 2012-06-24 DIAGNOSIS — C50319 Malignant neoplasm of lower-inner quadrant of unspecified female breast: Secondary | ICD-10-CM

## 2012-06-24 LAB — CBC WITH DIFFERENTIAL/PLATELET
BASO%: 0.3 % (ref 0.0–2.0)
Basophils Absolute: 0 10*3/uL (ref 0.0–0.1)
EOS%: 1.2 % (ref 0.0–7.0)
Eosinophils Absolute: 0.1 10*3/uL (ref 0.0–0.5)
HCT: 38.9 % (ref 34.8–46.6)
HGB: 13.3 g/dL (ref 11.6–15.9)
LYMPH%: 32.4 % (ref 14.0–49.7)
MCH: 32 pg (ref 25.1–34.0)
MCHC: 34.2 g/dL (ref 31.5–36.0)
MCV: 93.7 fL (ref 79.5–101.0)
MONO#: 0.3 10*3/uL (ref 0.1–0.9)
MONO%: 6 % (ref 0.0–14.0)
NEUT#: 3.1 10*3/uL (ref 1.5–6.5)
NEUT%: 60.1 % (ref 38.4–76.8)
Platelets: 160 10*3/uL (ref 145–400)
RBC: 4.15 10*6/uL (ref 3.70–5.45)
RDW: 12.2 % (ref 11.2–14.5)
WBC: 5.2 10*3/uL (ref 3.9–10.3)
lymph#: 1.7 10*3/uL (ref 0.9–3.3)

## 2012-06-24 LAB — COMPREHENSIVE METABOLIC PANEL (CC13)
ALT: 19 U/L (ref 0–55)
AST: 21 U/L (ref 5–34)
Albumin: 3.5 g/dL (ref 3.5–5.0)
Alkaline Phosphatase: 49 U/L (ref 40–150)
BUN: 14.9 mg/dL (ref 7.0–26.0)
CO2: 28 mEq/L (ref 22–29)
Calcium: 9.8 mg/dL (ref 8.4–10.4)
Chloride: 106 mEq/L (ref 98–107)
Creatinine: 0.9 mg/dL (ref 0.6–1.1)
Glucose: 82 mg/dl (ref 70–99)
Potassium: 3.6 mEq/L (ref 3.5–5.1)
Sodium: 143 mEq/L (ref 136–145)
Total Bilirubin: 0.45 mg/dL (ref 0.20–1.20)
Total Protein: 6.4 g/dL (ref 6.4–8.3)

## 2012-06-24 MED ORDER — ESTRADIOL 25 MCG VA TABS: 25.0000 ug | ORAL_TABLET | Freq: Every day | VAGINAL | Status: DC

## 2012-06-24 MED ORDER — LORAZEPAM 1 MG PO TABS: ORAL_TABLET | ORAL | Status: DC

## 2012-06-24 NOTE — Telephone Encounter (Signed)
appts made and printed 

## 2012-06-24 NOTE — Progress Notes (Signed)
ID: [name]   DOB: 1970-05-30  MR#: [id number]  WUJ#:[id number]   PCP: Provider Not In System GYN: Judith Ceo MD SU: Judith Kelp MD OTHER MD: Wayland Denis  HISTORY OF PRESENT ILLNESS: Judith [name] (goes by "Judith [name]") is a [place] woman who, at the age of 14, was referred by Dr. Coral [name] for treatment of right breast carcinoma.  The patient palpated a mass in her right breast which she brought to Dr. Verdell [name] attention. A prior screening mammogram on 02/06/2010 had been unremarkable. The patient was then scheduled for bilateral diagnostic mammogram and right breast ultrasonography on 12/22/2010, the studies demonstrating [name] ill defined mass with heterogeneous calcifications in the inner lower right breast, measuring 1.7 cm. The breasts are heterogeneously dense, but there were no other mammographic abnormalities in either breast. Clinically, there was a palpable firm mass at the 5:00 position of the right breast, 5 cm from the nipple. There were no palpable abnormalities in the right axilla. Ultrasound confirmed a 1.7 cm irregular, hypoechoic mass in the same position. There were some lymph nodes noted in the right exam of, one measuring 1.6 cm with a minimally thickened cortex inferiorly.  [name] ultrasound-guided core biopsy on 12/23/2010 [phone]) showed [name] invasive ductal carcinoma, grade 2, ER positive at 88%, PR positive at 11 is %, HER-2/neu positive with a FISH ratio of 4.61, and [name] MIB-1 of 36%.  The patient was referred to Dr. Claud [name] and bilateral breast MRIs were obtained 12/30/2010. This showed the mass in question in the right breast to measure 2.0 cm. In the right axilla there was a 1.8 cm lymph node with a thickened cortex. The left breast was unremarkable. With this information she proceeded to neoadjuvant treatment and definitive surgery as detailed below   INTERVAL HISTORY: [name] returns today for followup of her right breast cancer.  She finally got out from under her weight a week ago. Her hair is almost down to her shoulders. She added some highlights we would look like she had a haircut and the effect is very becoming. However she herself likes her hair considerably longer.   REVIEW OF SYSTEMS:  She remains very busy at work, and then with her 65-year-old. She describes herself as mildly fatigued. She has some stabbing back pains, which come and go, and are not more frequent or intense than prior. Her vision is slightly changed, now being 2040. Like most people this season she has a little bit of a runny nose, and minimal hoarseness. Occasionally she feels nauseated, this is rare, and associated with taking almost any pills. Sometimes it feels to her like her breasts H. Of course there is numbness in that area so when she scratches it feels very strange. This sensation of having "foreign bodies inside my body" can be disturbing to her. She is also not satisfied with the cosmetic result, because there are some "dribbles" noticeable when she is sitting or standing. They are not noticeable of course when she lies down. She feels forgetful and anxious, but not depressed. She is having hot flashes, which may be getting slightly better. She is having issues with intimacy because of vaginal dryness. A detailed review of systems was otherwise noncontributory  PAST MEDICAL HISTORY: Past Medical History  Diagnosis Date  . Migraines   . Asthma     as a child  . Breast cancer Sept 2012    right breast  . PONV (postoperative nausea and vomiting)   . Rash  neck  1. History of prior adenoidectomy. 2. History of rhinoplasty.   3. History of multiple uterine cervical treatments for precancerous lesions, none for the past 10 years or so. 4. History of fibroid tumor surgery. 5. History of D and C. 6. History of dysplastic nevi removed by Dr. Yetta Barre. 7. History of right "lazy eye" corrective surgery. 8. History of approximately  10-pack-years of tobacco abuse, resolved.  9. History of possible migraines (morning headaches). History of asthma as a child.  PAST SURGICAL HISTORY: Past Surgical History  Procedure Laterality Date  . Rhinoplasty    . Adenoidectomy    . Fibroid tumor surgery    . Dilation and curettage of uterus    . Uterine cervical treatments  2002    for precancerous lesions  . Lazy eye      corrective surgery  . Mastectomy w/ sentinel node biopsy  06/29/2011    Procedure: MASTECTOMY WITH SENTINEL LYMPH NODE BIOPSY;  Surgeon: [name], MD;  Location: [facility];  Service: General;  Laterality: Right;  right skin spairing mstectomy and right sentinel lymph node biopsy  . Breast reconstruction  06/29/2011    Procedure: BREAST RECONSTRUCTION;  Surgeon: Wayland Denis, DO;  Location: [facility] OR;  Service: Plastics;  Laterality: Bilateral;  Immediate Bilateral Breast Reconstruction with Bilateral Tissue Expanders and placement of  Alloderm   . Port-a-cath removal  03/12/2012    Procedure: MINOR REMOVAL PORT-A-CATH;  Surgeon: [name], MD;  Location: [facility];  Service: General;  Laterality: N/A;  Upper left    FAMILY HISTORY Family History  Problem Relation Age of Onset  . Adopted: Yes  . Heart attack Father   . Cancer Brother     prostate and lung  . Cancer Cousin     melanoma  . Cancer Sister     breast  . Cancer Sister     stomach, ovarian  :  The patient's biological father died at the age of 77 from myocardial infarction.  The patient's biological mother is alive at age 71.  The patient was adopted by her biological mother's parents.  She has 3 half-brothers.  No sisters.  One cousin on her mother's side died from melanoma at the age of 58.  There is other cancer history in the adopted side but these are not even half-brothers or sisters, they are children of her adopted parents who are really her grandparents so these would be uncles and aunts (1 lymph node cancer, 1  prostate cancer, 1 breast cancer at the age of 71).  GYNECOLOGIC HISTORY: She had menarche age 58 or 54.  She is GX, P1, first pregnancy to term at age 33. She stopped having periods approximately March 2013  SOCIAL HISTORY: She works in a Agricultural consultant.  Her husband, Marena Witts owns a family business called [employer].  They have been married 5 years.  They have a son, [name], age 12.  Council Mechanic also has a daughter, Sherise Geerdes, who lives in [place] and is 43 years old.  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  The patient is not a church attender.    ADVANCED DIRECTIVES: Not in place  HEALTH MAINTENANCE: History  Substance Use Topics  . Smoking status: Former Smoker    Quit date: 07/20/1996  . Smokeless tobacco: Never Used  . Alcohol Use: No     Colonoscopy:  PAP: Ch. Harper  Bone density:  Lipid panel:  Allergies  Allergen Reactions  .  Tussionex Pennkinetic Er (Hydrocod Polst-Cpm Polst Er) Other (See Comments)    halucinations  . Codeine Other (See Comments)    unkown  . Morphine And Related Anxiety    anxiety  . Penicillins Rash    Current Outpatient Prescriptions  Medication Sig Dispense Refill  . b complex vitamins tablet Take 1 tablet by mouth daily.      . Calcium Carbonate-Vitamin D (CALCIUM-VITAMIN D) 500-200 MG-UNIT per tablet Take 1 tablet by mouth 2 (two) times daily with a meal.      . doxycycline (DORYX) 100 MG EC tablet Take 100 mg by mouth 2 (two) times daily.      [name] doxycycline (VIBRAMYCIN) 100 MG capsule TAKE ONE CAPSULE BY MOUTH TWICE DAILY  30 capsule  5  . estradiol (VAGIFEM) 25 MCG vaginal tablet Place 1 tablet (25 mcg total) vaginally daily.  8 tablet  12  . fish oil-omega-3 fatty acids 1000 MG capsule Take 2 g by mouth daily.      . fluconazole (DIFLUCAN) 100 MG tablet TAKE ONE TABLET BY MOUTH EVERY DAY FOR 7 DAYS  7 tablet  0  . loratadine (CLARITIN) 10 MG tablet Take 10 mg by mouth daily.       [name] LORazepam (ATIVAN) 1 MG tablet TAKE  ONE-HALF TABLET BY MOUTH AT BEDTIME AS NEEDED FOR ANXIETY  30 tablet  3  . Multiple Vitamins-Minerals (HAIR/SKIN/NAILS PO) Take by mouth daily.      . prenatal vitamin w/FE, FA (PRENATAL 1 + 1) 27-1 MG TABS Take 1 tablet by mouth Daily.      . tamoxifen (NOLVADEX) 20 MG tablet TAKE ONE TABLET BY MOUTH EVERY DAY  30 tablet  2   No current facility-administered medications for this visit.    OBJECTIVE:  Filed Vitals:   06/24/12 1340  BP: 98/64  Pulse: 64  Temp: 98.1 F (36.7 C)  Resp: 20     Body mass index is 22.38 kg/(m^2).    ECOG FS: 0 Filed Weights   06/24/12 1340  Weight: 126 lb 4.8 oz (57.289 kg)   Young white female who was tearful during today's visit Sclerae unicteric Oropharynx clear No cervical or supraclavicular adenopathy,  Lungs no rales or rhonchi, good excursion bilaterally Heart regular rate and rhythm, no murmur appreciated Abd benign, soft, nontender with positive bowel sounds MSK no focal spinal tenderness, No peripheral edema Neuro: nonfocal , well oriented, appropriate affect Breasts:  she status post bilateral mastectomies with implant placement. The implants are sagging slightly with "ridges" noted in the upper portion of the breasts bilaterally. The small palpable abnormality in the left axilla has been evaluated by ultrasonography and is unchanged  LAB RESULTS: Lab Results  Component Value Date   WBC 5.2 06/24/2012   NEUTROABS 3.1 06/24/2012   HGB 13.3 06/24/2012   HCT 38.9 06/24/2012   MCV 93.7 06/24/2012   PLT 160 06/24/2012      Chemistry      Component Value Date/Time   NA 143 06/24/2012 1317   NA 139 11/02/2011 1357   K 3.6 06/24/2012 1317   K 3.7 11/02/2011 1357   CL 106 06/24/2012 1317   CL 104 11/02/2011 1357   CO2 28 06/24/2012 1317   CO2 29 11/02/2011 1357   BUN 14.9 06/24/2012 1317   BUN 16 11/02/2011 1357   CREATININE 0.9 06/24/2012 1317   CREATININE 0.79 11/02/2011 1357      Component Value Date/Time   CALCIUM 9.8 06/24/2012 1317  CALCIUM 9.2 11/02/2011 1357   ALKPHOS 49 06/24/2012 1317   ALKPHOS 57 11/02/2011 1357   AST 21 06/24/2012 1317   AST 17 11/02/2011 1357   ALT 19 06/24/2012 1317   ALT 15 11/02/2011 1357   BILITOT 0.45 06/24/2012 1317   BILITOT 0.5 11/02/2011 1357       STUDIES: Echocardiogram 03/25/2012 showed a well preserved ejection fraction at 55 - 60 %.  ASSESSMENT: 42 y.o.  BRCA 1-2 negative [name], [place] woman,   (1) status post right breast biopsy in September 2012 for a clinically 2 cm invasive ductal carcinoma, with some enlarged Right axillary lymph nodes, the largest one of which was negative by biopsy. The tumor was grade 2, estrogen receptor positive at 88%, progesterone receptor positive at 11%, HER-2/neu positive by CISH with a ratio of 4.61, and a proliferation marker of 36%.   (2) treated in the neoadjuvant setting with Q 3 week docetaxel/ carboplatin/ trastuzumab x6, completed 05/18/2011.  Continued on trastuzumab every 3 weeks to complete one year, last dose in November 2013.  (3) s/p bilateral mastectomies with sentinel node biopsy 06/28/1969 for a residual right-sided ypT1c ypN0 invasive ductal carcinoma, grade 2, with immediate expander placement  (4) on tamoxifen as of May 2013   PLAN:  The patient is doing quite well from a breast cancer point of view. While she is on tamoxifen, I see no problem with her receiving Vagifem suppositories to deal with the vaginal dryness issue. I went ahead and wrote her the prescription and explained how to use this medication. She understands that both unopposed estrogen and tamoxifen can increase the risk of endometrial cancer. Right now, the intimacy issues more important to her. The oral plan is to continue tamoxifen at least another year before considering switching to [name] aromatase inhibitor. She knows to call for any problems that may develop before the next visit.  MAGRINAT,GUSTAV C    06/24/2012

## 2012-09-04 ENCOUNTER — Telehealth (HOSPITAL_COMMUNITY): Payer: Self-pay | Admitting: *Deleted

## 2012-09-04 NOTE — Telephone Encounter (Signed)
Telephoned patient at mobile # and left message to return call to [facility] 

## 2012-09-18 ENCOUNTER — Other Ambulatory Visit: Payer: Self-pay | Admitting: Oncology

## 2012-09-18 NOTE — Telephone Encounter (Signed)
Patient reports another vaginal yeast infection with burning,itching and discharge. Last filled fluconazole on 2/25 and took a few. Then had recurrence few weeks later and finished bottle (#7). Now needs to take again. Reports she still sees her GYN annually and is caught up on her pap smears/pelvic exams.

## 2012-09-18 NOTE — Telephone Encounter (Signed)
Per Zollie Scale, PA: OK to refill, but take entire course of fluconazole and not partial course. If infection returns, she needs to call her GYN to be seen at the time of the infection so a pelvic exam can be done and culture of drainage. Left this on her VM.

## 2012-09-20 ENCOUNTER — Other Ambulatory Visit: Payer: Self-pay | Admitting: Physician Assistant

## 2012-09-20 DIAGNOSIS — C50919 Malignant neoplasm of unspecified site of unspecified female breast: Secondary | ICD-10-CM

## 2012-09-23 ENCOUNTER — Ambulatory Visit (HOSPITAL_BASED_OUTPATIENT_CLINIC_OR_DEPARTMENT_OTHER): Payer: BC Managed Care – PPO | Admitting: Oncology

## 2012-09-23 ENCOUNTER — Other Ambulatory Visit (HOSPITAL_BASED_OUTPATIENT_CLINIC_OR_DEPARTMENT_OTHER): Payer: BC Managed Care – PPO | Admitting: Lab

## 2012-09-23 ENCOUNTER — Telehealth: Payer: Self-pay | Admitting: Oncology

## 2012-09-23 VITALS — BP 96/61 | HR 70 | Temp 98.6°F | Resp 20 | Ht 63.0 in | Wt 127.1 lb

## 2012-09-23 DIAGNOSIS — C50319 Malignant neoplasm of lower-inner quadrant of unspecified female breast: Secondary | ICD-10-CM

## 2012-09-23 DIAGNOSIS — C50919 Malignant neoplasm of unspecified site of unspecified female breast: Secondary | ICD-10-CM

## 2012-09-23 DIAGNOSIS — C50911 Malignant neoplasm of unspecified site of right female breast: Secondary | ICD-10-CM

## 2012-09-23 LAB — CBC WITH DIFFERENTIAL/PLATELET
BASO%: 0.5 % (ref 0.0–2.0)
Basophils Absolute: 0 10*3/uL (ref 0.0–0.1)
EOS%: 1.4 % (ref 0.0–7.0)
Eosinophils Absolute: 0.1 10*3/uL (ref 0.0–0.5)
HCT: 39.4 % (ref 34.8–46.6)
HGB: 13.3 g/dL (ref 11.6–15.9)
LYMPH%: 37.5 % (ref 14.0–49.7)
MCH: 31.6 pg (ref 25.1–34.0)
MCHC: 33.8 g/dL (ref 31.5–36.0)
MCV: 93.7 fL (ref 79.5–101.0)
MONO#: 0.4 10*3/uL (ref 0.1–0.9)
MONO%: 6 % (ref 0.0–14.0)
NEUT#: 3.5 10*3/uL (ref 1.5–6.5)
NEUT%: 54.6 % (ref 38.4–76.8)
Platelets: 194 10*3/uL (ref 145–400)
RBC: 4.21 10*6/uL (ref 3.70–5.45)
RDW: 12.7 % (ref 11.2–14.5)
WBC: 6.4 10*3/uL (ref 3.9–10.3)
lymph#: 2.4 10*3/uL (ref 0.9–3.3)

## 2012-09-23 LAB — COMPREHENSIVE METABOLIC PANEL (CC13)
ALT: 14 U/L (ref 0–55)
AST: 16 U/L (ref 5–34)
Albumin: 3.7 g/dL (ref 3.5–5.0)
Alkaline Phosphatase: 58 U/L (ref 40–150)
BUN: 13 mg/dL (ref 7.0–26.0)
CO2: 28 mEq/L (ref 22–29)
Calcium: 9.3 mg/dL (ref 8.4–10.4)
Chloride: 104 mEq/L (ref 98–107)
Creatinine: 0.8 mg/dL (ref 0.6–1.1)
Glucose: 96 mg/dl (ref 70–99)
Potassium: 3.6 mEq/L (ref 3.5–5.1)
Sodium: 141 mEq/L (ref 136–145)
Total Bilirubin: 0.42 mg/dL (ref 0.20–1.20)
Total Protein: 6.7 g/dL (ref 6.4–8.3)

## 2012-09-23 NOTE — Progress Notes (Signed)
ID: [name]   DOB: 1970/05/31  MR#: [id number]  WUJ#:[id number]   PCP: Provider Not In System GYN: Coral Ceo MD SU: Claud Kelp MD OTHER MD: Wayland Denis  HISTORY OF PRESENT ILLNESS: [name] (goes by "[name]") is a [place] woman who, at the age of 42, was referred by Dr. Coral Ceo for treatment of right breast carcinoma.  The patient palpated a mass in her right breast which she brought to Dr. Verdell Carmine attention. A prior screening mammogram on 02/06/2010 had been unremarkable. The patient was then scheduled for bilateral diagnostic mammogram and right breast ultrasonography on 12/22/2010, the studies demonstrating an ill defined mass with heterogeneous calcifications in the inner lower right breast, measuring 1.7 cm. The breasts are heterogeneously dense, but there were no other mammographic abnormalities in either breast. Clinically, there was a palpable firm mass at the 5:00 position of the right breast, 5 cm from the nipple. There were no palpable abnormalities in the right axilla. Ultrasound confirmed a 1.7 cm irregular, hypoechoic mass in the same position. There were some lymph nodes noted in the right exam of, one measuring 1.6 cm with a minimally thickened cortex inferiorly.  An ultrasound-guided core biopsy on 12/23/2010 [phone]) showed an invasive ductal carcinoma, grade 2, ER positive at 88%, PR positive at 11 is %, HER-2/neu positive with a FISH ratio of 4.61, and an MIB-1 of 36%.  The patient was referred to Dr. Claud Kelp and bilateral breast MRIs were obtained 12/30/2010. This showed the mass in question in the right breast to measure 2.0 cm. In the right axilla there was a 1.8 cm lymph node with a thickened cortex. The left breast was unremarkable. With this information she proceeded to neoadjuvant treatment and definitive surgery as detailed below   INTERVAL HISTORY: [name] returns today for followup of her right breast cancer  accompanied by her husband [name]. The interval history is generally unremarkable. She continues to work full-time.  REVIEW OF SYSTEMS:  She occasionally has problems with blurred vision, complaints of hoarseness at times especially in the evenings, a bit of a running nose, shortness of breath with vigorous activity, and some constipation. She had a recent urinary tract infection, and currently has a white and brown discharge which does not smell normal. She has been given a prescription for Diflucan and just started that 2 days ago. She tells me she saw an eye doctor at [facility] and she was told she had glaucoma, but no treatment was initiated. She has some tightness in her chest is at times, not accompanied by any other symptoms. She does not believe this is due to her prior chest surgery. She has occasional headaches and sometimes feels tired. She bruises easily. She has hot flashes. Her libido is down. She doesn't think Vagifem would help that. She is displeased with her current Engineer, petroleum and plans to make a change. She remains anxious, but not depressed. Otherwise a detailed review of systems today was noncontributory  PAST MEDICAL HISTORY: Past Medical History  Diagnosis Date  . Migraines   . Asthma     as a child  . Breast cancer Sept 2012    right breast  . PONV (postoperative nausea and vomiting)   . Rash     neck  1. History of prior adenoidectomy. 2. History of rhinoplasty.   3. History of multiple uterine cervical treatments for precancerous lesions, none for the past 10 years or so. 4. History of fibroid tumor  surgery. 5. History of D and C. 6. History of dysplastic nevi removed by Dr. Yetta Barre. 7. History of right "lazy eye" corrective surgery. 8. History of approximately 10-pack-years of tobacco abuse, resolved.  9. History of possible migraines (morning headaches). History of asthma as a child.  PAST SURGICAL HISTORY: Past Surgical History  Procedure  Laterality Date  . Rhinoplasty    . Adenoidectomy    . Fibroid tumor surgery    . Dilation and curettage of uterus    . Uterine cervical treatments  2002    for precancerous lesions  . Lazy eye      corrective surgery  . Mastectomy w/ sentinel node biopsy  06/29/2011    Procedure: MASTECTOMY WITH SENTINEL LYMPH NODE BIOPSY;  Surgeon: [name], MD;  Location: [facility];  Service: General;  Laterality: Right;  right skin spairing mstectomy and right sentinel lymph node biopsy  . Breast reconstruction  06/29/2011    Procedure: BREAST RECONSTRUCTION;  Surgeon: Wayland Denis, DO;  Location: [facility];  Service: Plastics;  Laterality: Bilateral;  Immediate Bilateral Breast Reconstruction with Bilateral Tissue Expanders and placement of  Alloderm   . Port-a-cath removal  03/12/2012    Procedure: MINOR REMOVAL PORT-A-CATH;  Surgeon: [name], MD;  Location: [facility];  Service: General;  Laterality: N/A;  Upper left    FAMILY HISTORY Family History  Problem Relation Age of Onset  . Adopted: Yes  . Heart attack Father   . Cancer Brother     prostate and lung  . Cancer Cousin     melanoma  . Cancer Sister     breast  . Cancer Sister     stomach, ovarian  :  The patient's biological father died at the age of 43 from myocardial infarction.  The patient's biological mother is alive at age 43.  The patient was adopted by her biological mother's parents.  She has 3 half-brothers.  No sisters.  One cousin on her mother's side died from melanoma at the age of 91.  There is other cancer history in the adopted side but these are not even half-brothers or sisters, they are children of her adopted parents who are really her grandparents so these would be uncles and aunts (1 lymph node cancer, 1 prostate cancer, 1 breast cancer at the age of 83).  GYNECOLOGIC HISTORY: She had menarche age 52 or 15.  She is GX, P1, first pregnancy to term at age 47. She stopped having periods  approximately March 2013  SOCIAL HISTORY: She works in a Agricultural consultant.  Her husband, Arlys Scatena owns a family business called [employer].  They have been married 5 years.  They have a son, [name], age 63.  Council Mechanic also has a daughter, Priscillia Fouch, who lives in [place] and is 43 years old.  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  The patient is not a church attender.    ADVANCED DIRECTIVES: Not in place  HEALTH MAINTENANCE: History  Substance Use Topics  . Smoking status: Former Smoker    Quit date: 07/20/1996  . Smokeless tobacco: Never Used  . Alcohol Use: No     Colonoscopy:  PAP: Ch. Harper  Bone density:  Lipid panel:  Allergies  Allergen Reactions  . Tussionex Pennkinetic Er (Hydrocod Polst-Cpm Polst Er) Other (See Comments)    halucinations  . Codeine Other (See Comments)    unkown  . Morphine And Related Anxiety    anxiety  .  Penicillins Rash    Current Outpatient Prescriptions  Medication Sig Dispense Refill  . b complex vitamins tablet Take 1 tablet by mouth daily.      . Calcium Carbonate-Vitamin D (CALCIUM-VITAMIN D) 500-200 MG-UNIT per tablet Take 1 tablet by mouth 2 (two) times daily with a meal.      . doxycycline (DORYX) 100 MG EC tablet Take 100 mg by mouth 2 (two) times daily.      [name] doxycycline (VIBRAMYCIN) 100 MG capsule TAKE ONE CAPSULE BY MOUTH TWICE DAILY  30 capsule  5  . estradiol (VAGIFEM) 25 MCG vaginal tablet Place 1 tablet (25 mcg total) vaginally daily.  8 tablet  12  . fish oil-omega-3 fatty acids 1000 MG capsule Take 2 g by mouth daily.      . fluconazole (DIFLUCAN) 100 MG tablet Take 1 tablet (100 mg total) by mouth daily. X 7 days--take entire course of medication for efficacy. Call GYN for recurrent infection  7 tablet  0  . loratadine (CLARITIN) 10 MG tablet Take 10 mg by mouth daily.       [name] LORazepam (ATIVAN) 1 MG tablet TAKE ONE-HALF TABLET BY MOUTH AT BEDTIME AS NEEDED FOR ANXIETY  30 tablet  3  . Multiple  Vitamins-Minerals (HAIR/SKIN/NAILS PO) Take by mouth daily.      . prenatal vitamin w/FE, FA (PRENATAL 1 + 1) 27-1 MG TABS Take 1 tablet by mouth Daily.      . tamoxifen (NOLVADEX) 20 MG tablet TAKE ONE TABLET BY MOUTH EVERY DAY  90 tablet  3   No current facility-administered medications for this visit.    OBJECTIVE: Middle-aged white woman in no acute distress  Filed Vitals:   09/23/12 1428  BP: 96/61  Pulse: 70  Temp: 98.6 F (37 C)  Resp: 20     Body mass index is 22.52 kg/(m^2).    ECOG FS: 0 Filed Weights   09/23/12 1428  Weight: 127 lb 1.6 oz (57.652 kg)   Sclerae unicteric Oropharynx clear No cervical or supraclavicular adenopathy Lungs no rales or rhonchi Heart regular rate and rhythm Abd benign MSK no focal spinal tenderness, no peripheral edema Neuro: nonfocal Breasts: Status post bilateral mastectomies. There is no evidence of recurrence on the right, and the right axilla is benign. The left side is unremarkable.   LAB RESULTS: Lab Results  Component Value Date   WBC 6.4 09/23/2012   NEUTROABS 3.5 09/23/2012   HGB 13.3 09/23/2012   HCT 39.4 09/23/2012   MCV 93.7 09/23/2012   PLT 194 09/23/2012      Chemistry      Component Value Date/Time   NA 143 06/24/2012 1317   NA 139 11/02/2011 1357   K 3.6 06/24/2012 1317   K 3.7 11/02/2011 1357   CL 106 06/24/2012 1317   CL 104 11/02/2011 1357   CO2 28 06/24/2012 1317   CO2 29 11/02/2011 1357   BUN 14.9 06/24/2012 1317   BUN 16 11/02/2011 1357   CREATININE 0.9 06/24/2012 1317   CREATININE 0.79 11/02/2011 1357      Component Value Date/Time   CALCIUM 9.8 06/24/2012 1317   CALCIUM 9.2 11/02/2011 1357   ALKPHOS 49 06/24/2012 1317   ALKPHOS 57 11/02/2011 1357   AST 21 06/24/2012 1317   AST 17 11/02/2011 1357   ALT 19 06/24/2012 1317   ALT 15 11/02/2011 1357   BILITOT 0.45 06/24/2012 1317   BILITOT 0.5 11/02/2011 1357       STUDIES:  No results found.   ASSESSMENT: 42 y.o.  BRCA 1-2 negative [name], [place] woman,    (1) status post right breast biopsy in September 2012 for a clinically 2 cm invasive ductal carcinoma, with some enlarged Right axillary lymph nodes, the largest one of which was negative by biopsy. The tumor was grade 2, estrogen receptor positive at 88%, progesterone receptor positive at 11%, HER-2/neu positive by CISH with a ratio of 4.61, and a proliferation marker of 36%.   (2) treated in the neoadjuvant setting with Q 3 week docetaxel/ carboplatin/ trastuzumab x6, completed 05/18/2011.  Continued on trastuzumab every 3 weeks to complete one year, last dose in November 2013.  (3) s/p bilateral mastectomies with sentinel node biopsy 06/29/2011 for a residual right-sided ypT1c ypN0 invasive ductal carcinoma, grade 2, with immediate expander placement  (4) on tamoxifen as of May 2013   PLAN:  Karmyn is doing well from a breast cancer point of view, with no evidence of disease recurrence. She continues to be very anxious, and today's visit took well over 40 minutes. However she manages her anxiety generally well. She continues to work full-time, she takes care of her family, and she has very good communication with her husband. I think she would benefit from participating in the "finding your new normal" program, but it conflicts with her husband's bowling night and she has not found her way to get there yet. I am going to see her again in October. We will consider broadening the followup interval to 6 months after that visit, if she is comfortable with that decision. When she reaches the 2 year mark from her surgery, namely March of 2015, she will have restaging scans, most likely a CT of the chest and bone scan. Otherwise the patient knows to call for any problems that may develop before the next visit here.  Rayana Geurin C    09/23/2012

## 2012-09-24 ENCOUNTER — Encounter: Payer: Self-pay | Admitting: Dietician

## 2012-09-24 NOTE — Progress Notes (Signed)
Breast Cancer Nutrition Class Attendance Note  Date: 09/24/2012  Pt attended [facility]'s Breast Cancer Nutrition Class, "Food For Your Fight". Pt was educated on basic cancer nutrition principles, including plant based diet and principles from AICR  (American Institute [employer]) about latest nutrition findings and recommendations. Questions answered. Handouts and recipes provided.   Red Mandt A. Kayan, RD, LDN Pager: [phone] 

## 2012-12-03 ENCOUNTER — Telehealth: Payer: Self-pay | Admitting: *Deleted

## 2012-12-04 ENCOUNTER — Telehealth: Payer: Self-pay | Admitting: *Deleted

## 2012-12-04 ENCOUNTER — Other Ambulatory Visit: Payer: Self-pay | Admitting: Oncology

## 2012-12-04 DIAGNOSIS — C50919 Malignant neoplasm of unspecified site of unspecified female breast: Secondary | ICD-10-CM

## 2012-12-04 NOTE — Progress Notes (Signed)
[name] call to report a change in her anterior chest wall. Yesterday drop in for evaluation. On exam the area in the upper sternum on the left for the second rib joints the sternum is swollen in fact April 2 rib area is swollen for approximately 2 inches from the sternum laterally. This feels soft, not rubbery but not quite like fat either. There is minimal tenderness to emphatic palpation of this area.  I think this is going to be postoperative change. She had her port just adjacent to this area previously. She just had a revision on the right breast area. Nevertheless this does warrant evaluation and I am setting her up for an MRI of the upper sternum/second rib. She will call for results.

## 2012-12-04 NOTE — Telephone Encounter (Signed)
appts made and printed. Pt is aware that cs will call her with an appt for mri.Judith KitchenMarland KitchenTD

## 2012-12-11 ENCOUNTER — Other Ambulatory Visit: Payer: Self-pay | Admitting: Oncology

## 2012-12-11 ENCOUNTER — Ambulatory Visit (HOSPITAL_COMMUNITY)
Admission: RE | Admit: 2012-12-11 | Discharge: 2012-12-11 | Disposition: A | Discharge status: Home / Self Care | Payer: BC Managed Care – PPO | Source: Ambulatory Visit | Attending: Oncology | Admitting: Oncology

## 2012-12-11 DIAGNOSIS — Z853 Personal history of malignant neoplasm of breast: Secondary | ICD-10-CM | POA: Insufficient documentation

## 2012-12-11 DIAGNOSIS — C50919 Malignant neoplasm of unspecified site of unspecified female breast: Secondary | ICD-10-CM

## 2012-12-11 DIAGNOSIS — M899 Disorder of bone, unspecified: Secondary | ICD-10-CM | POA: Insufficient documentation

## 2012-12-11 MED ORDER — GADOBENATE DIMEGLUMINE 529 MG/ML IV SOLN
10.0000 mL | Freq: Once | INTRAVENOUS | Status: AC | PRN
  Administered 2012-12-11: 10 mL via INTRAVENOUS

## 2012-12-12 ENCOUNTER — Other Ambulatory Visit (HOSPITAL_COMMUNITY): Payer: BC Managed Care – PPO

## 2013-01-08 ENCOUNTER — Ambulatory Visit (INDEPENDENT_AMBULATORY_CARE_PROVIDER_SITE_OTHER): Payer: BC Managed Care – PPO | Admitting: Obstetrics

## 2013-01-08 ENCOUNTER — Encounter: Payer: Self-pay | Admitting: Obstetrics

## 2013-01-08 VITALS — BP 98/71 | HR 65 | Temp 98.0°F | Ht 63.0 in | Wt 122.2 lb

## 2013-01-08 DIAGNOSIS — Z0000 Encounter for general adult medical examination without abnormal findings: Secondary | ICD-10-CM

## 2013-01-08 DIAGNOSIS — R52 Pain, unspecified: Secondary | ICD-10-CM

## 2013-01-08 DIAGNOSIS — Z01419 Encounter for gynecological examination (general) (routine) without abnormal findings: Secondary | ICD-10-CM

## 2013-01-08 DIAGNOSIS — C50919 Malignant neoplasm of unspecified site of unspecified female breast: Secondary | ICD-10-CM

## 2013-01-08 DIAGNOSIS — Z113 Encounter for screening for infections with a predominantly sexual mode of transmission: Secondary | ICD-10-CM

## 2013-01-08 LAB — COMPREHENSIVE METABOLIC PANEL
ALT: 13 U/L (ref 0–35)
AST: 18 U/L (ref 0–37)
Albumin: 4.5 g/dL (ref 3.5–5.2)
Alkaline Phosphatase: 47 U/L (ref 39–117)
BUN: 13 mg/dL (ref 6–23)
CO2: 27 mEq/L (ref 19–32)
Calcium: 9.8 mg/dL (ref 8.4–10.5)
Chloride: 104 mEq/L (ref 96–112)
Creat: 0.77 mg/dL (ref 0.50–1.10)
Glucose, Bld: 81 mg/dL (ref 70–99)
Potassium: 4.4 mEq/L (ref 3.5–5.3)
Sodium: 140 mEq/L (ref 135–145)
Total Bilirubin: 0.5 mg/dL (ref 0.3–1.2)
Total Protein: 6.6 g/dL (ref 6.0–8.3)

## 2013-01-08 LAB — CBC WITH DIFFERENTIAL/PLATELET
Basophils Absolute: 0 10*3/uL (ref 0.0–0.1)
Basophils Relative: 0 % (ref 0–1)
Eosinophils Absolute: 0 10*3/uL (ref 0.0–0.7)
Eosinophils Relative: 1 % (ref 0–5)
HCT: 38.2 % (ref 36.0–46.0)
Hemoglobin: 13.7 g/dL (ref 12.0–15.0)
Lymphocytes Relative: 32 % (ref 12–46)
Lymphs Abs: 1.8 10*3/uL (ref 0.7–4.0)
MCH: 32.5 pg (ref 26.0–34.0)
MCHC: 35.9 g/dL (ref 30.0–36.0)
MCV: 90.5 fL (ref 78.0–100.0)
Monocytes Absolute: 0.3 10*3/uL (ref 0.1–1.0)
Monocytes Relative: 6 % (ref 3–12)
Neutro Abs: 3.3 10*3/uL (ref 1.7–7.7)
Neutrophils Relative %: 61 % (ref 43–77)
Platelets: 199 10*3/uL (ref 150–400)
RBC: 4.22 MIL/uL (ref 3.87–5.11)
RDW: 12.9 % (ref 11.5–15.5)
WBC: 5.5 10*3/uL (ref 4.0–10.5)

## 2013-01-08 MED ORDER — HYDROCODONE-ACETAMINOPHEN 5-325 MG PO TABS: 1.0000 | ORAL_TABLET | Freq: Four times a day (QID) | ORAL | Status: DC | PRN

## 2013-01-08 NOTE — Progress Notes (Signed)
.   Subjective:     [name] is a 42 y.o. female here for a routine exam and routine blood work.  No current complaints.  Personal health questionnaire reviewed: yes.   Gynecologic History No LMP recorded. Patient is not currently having periods (Reason: Other). - chemo. Contraception: none Last Pap: 2013. Results were: normal Last mammogram: 2012. Results were: normal  Obstetric History OB History  No data available     The following portions of the patient's history were reviewed and updated as appropriate: allergies, current medications, past family history, past medical history, past social history, past surgical history and problem list.  Review of Systems Pertinent items are noted in HPI.    Objective:    General appearance: alert and no distress Breasts: normal appearance, no masses or tenderness, Normal to palpation without dominant masses, scars noted from mastectomy and reconstruction. Abdomen: normal findings: soft, non-tender Pelvic: cervix normal in appearance, external genitalia normal, no adnexal masses or tenderness, no cervical motion tenderness, uterus normal size, shape, and consistency and vagina normal without discharge    Assessment:    Healthy female exam.   H/O breast CA, bilateral mastectomy and breast reconstruction.   Plan:    Follow up in: 1 year. Ultrsound ordered to follow Tamoxifen effects on endometrium.    Bone density study ordered.

## 2013-01-09 LAB — HEPATITIS B SURFACE ANTIGEN: Hepatitis B Surface Ag: NEGATIVE

## 2013-01-09 LAB — VITAMIN D 25 HYDROXY (VIT D DEFICIENCY, FRACTURES): Vit D, 25-Hydroxy: 50 ng/mL (ref 30–89)

## 2013-01-09 LAB — WET PREP BY MOLECULAR PROBE
Candida species: POSITIVE — AB
Gardnerella vaginalis: NEGATIVE
Trichomonas vaginosis: NEGATIVE

## 2013-01-09 LAB — PAP IG W/ RFLX HPV ASCU

## 2013-01-09 LAB — HIV ANTIBODY (ROUTINE TESTING W REFLEX): HIV: NONREACTIVE

## 2013-01-09 LAB — RPR

## 2013-01-09 LAB — TSH: TSH: 1.081 u[IU]/mL (ref 0.350–4.500)

## 2013-01-09 LAB — GC/CHLAMYDIA PROBE AMP
CT Probe RNA: NEGATIVE
GC Probe RNA: NEGATIVE

## 2013-01-09 LAB — HEPATITIS C ANTIBODY: HCV Ab: NEGATIVE

## 2013-01-10 ENCOUNTER — Other Ambulatory Visit: Payer: Self-pay | Admitting: *Deleted

## 2013-01-10 LAB — HUMAN PAPILLOMAVIRUS, HIGH RISK: HPV DNA High Risk: NOT DETECTED

## 2013-01-13 ENCOUNTER — Telehealth: Payer: Self-pay | Admitting: *Deleted

## 2013-01-13 ENCOUNTER — Ambulatory Visit (HOSPITAL_BASED_OUTPATIENT_CLINIC_OR_DEPARTMENT_OTHER): Payer: BC Managed Care – PPO | Admitting: Oncology

## 2013-01-13 ENCOUNTER — Other Ambulatory Visit: Payer: BC Managed Care – PPO | Admitting: Lab

## 2013-01-13 VITALS — BP 107/70 | HR 62 | Temp 98.7°F | Resp 20 | Ht 63.0 in | Wt 124.2 lb

## 2013-01-13 DIAGNOSIS — F411 Generalized anxiety disorder: Secondary | ICD-10-CM

## 2013-01-13 DIAGNOSIS — C50311 Malignant neoplasm of lower-inner quadrant of right female breast: Secondary | ICD-10-CM

## 2013-01-13 DIAGNOSIS — C50319 Malignant neoplasm of lower-inner quadrant of unspecified female breast: Secondary | ICD-10-CM

## 2013-01-13 DIAGNOSIS — Z170 Estrogen receptor positive status [ER+]: Secondary | ICD-10-CM

## 2013-01-13 MED ORDER — LORAZEPAM 0.5 MG PO TABS: 0.5000 mg | ORAL_TABLET | Freq: Every evening | ORAL | Status: DC | PRN

## 2013-01-13 NOTE — Progress Notes (Signed)
ID: [name]   DOB: Nov 08, 1970  MR#: [id number]  WUJ#:[id number]   PCP: Provider Not In System GYN: Coral Ceo MD SU: Claud Kelp MD OTHER MD: Wayland Denis  HISTORY OF PRESENT ILLNESS: Ardyth Kelso (goes by "Taralynn Quiett") is a [place] woman who, at the age of 42, was referred by Dr. Coral Ceo for treatment of right breast carcinoma.  The patient palpated a mass in her right breast which she brought to Dr. Verdell Carmine attention. A prior screening mammogram on 02/06/2010 had been unremarkable. The patient was then scheduled for bilateral diagnostic mammogram and right breast ultrasonography on 12/22/2010, the studies demonstrating an ill defined mass with heterogeneous calcifications in the inner lower right breast, measuring 1.7 cm. The breasts are heterogeneously dense, but there were no other mammographic abnormalities in either breast. Clinically, there was a palpable firm mass at the 5:00 position of the right breast, 5 cm from the nipple. There were no palpable abnormalities in the right axilla. Ultrasound confirmed a 1.7 cm irregular, hypoechoic mass in the same position. There were some lymph nodes noted in the right exam of, one measuring 1.6 cm with a minimally thickened cortex inferiorly.  An ultrasound-guided core biopsy on 12/23/2010 [phone]) showed an invasive ductal carcinoma, grade 2, ER positive at 88%, PR positive at 11 is %, HER-2/neu positive with a FISH ratio of 4.61, and an MIB-1 of 36%.  The patient was referred to Dr. Claud Kelp and bilateral breast MRIs were obtained 12/30/2010. This showed the mass in question in the right breast to measure 2.0 cm. In the right axilla there was a 1.8 cm lymph node with a thickened cortex. The left breast was unremarkable. With this information she proceeded to neoadjuvant treatment and definitive surgery as detailed below   INTERVAL HISTORY: Addysyn returns today for followup of her breast cancer. The  interval history is significant for her having had an MRI of her sternum, which was benign. The irregularity that she noticed is going to be due to her prior bilateral mastectomies. She is going to "finding your new normal" and finds it useful. She has not yet been able to get back to work.  REVIEW OF SYSTEMS:  She describes herself as fatigue. Sunday she wakes up tired, and Sunday she wakes up with pain in her arms and legs. This tends to get better during the day. It is very intermittent. She is having some changes in her vision which are going to be age related. She has a little bit of a runny nose and some shortness of breath which she feels may be related to anxiety. Sometimes she has abdominal pain and she has noted a little bit of blood in her stool. She is constipated. She does not like to take stool softeners but did okay with MiraLAX and will give that a try. She has occasional headaches, feels forgetful and anxious, but not depressed. Her hot flashes are "tolerable". Otherwise family is doing "good". A detailed review of systems was otherwise noncontributory  PAST MEDICAL HISTORY: Past Medical History  Diagnosis Date  . Migraines   . Asthma     as a child  . Breast cancer Sept 2012    right breast  . PONV (postoperative nausea and vomiting)   . Rash     neck  1. History of prior adenoidectomy. 2. History of rhinoplasty.   3. History of multiple uterine cervical treatments for precancerous lesions, none for the past 10 years or so. 4.  History of fibroid tumor surgery. 5. History of D and C. 6. History of dysplastic nevi removed by Dr. Yetta Barre. 7. History of right "lazy eye" corrective surgery. 8. History of approximately 10-pack-years of tobacco abuse, resolved.  9. History of possible migraines (morning headaches). History of asthma as a child.  PAST SURGICAL HISTORY: Past Surgical History  Procedure Laterality Date  . Rhinoplasty    . Adenoidectomy    . Fibroid tumor surgery     . Dilation and curettage of uterus    . Uterine cervical treatments  2002    for precancerous lesions  . Lazy eye      corrective surgery  . Mastectomy w/ sentinel node biopsy  06/29/2011    Procedure: MASTECTOMY WITH SENTINEL LYMPH NODE BIOPSY;  Surgeon: [name], MD;  Location: [facility] OR;  Service: General;  Laterality: Right;  right skin spairing mstectomy and right sentinel lymph node biopsy  . Breast reconstruction  06/29/2011    Procedure: BREAST RECONSTRUCTION;  Surgeon: Wayland Denis, DO;  Location: [facility] OR;  Service: Plastics;  Laterality: Bilateral;  Immediate Bilateral Breast Reconstruction with Bilateral Tissue Expanders and placement of  Alloderm   . Port-a-cath removal  03/12/2012    Procedure: MINOR REMOVAL PORT-A-CATH;  Surgeon: [name], MD;  Location: [facility];  Service: General;  Laterality: N/A;  Upper left    FAMILY HISTORY Family History  Problem Relation Age of Onset  . Adopted: Yes  . Heart attack Father   . Cancer Brother     prostate and lung  . Cancer Cousin     melanoma  . Cancer Sister     breast  . Cancer Sister     stomach, ovarian  :  The patient's biological father died at the age of 66 from myocardial infarction.  The patient's biological mother is alive at age 63.  The patient was adopted by her biological mother's parents.  She has 3 half-brothers.  No sisters.  One cousin on her mother's side died from melanoma at the age of 23.  There is other cancer history in the adopted side but these are not even half-brothers or sisters, they are children of her adopted parents who are really her grandparents so these would be uncles and aunts (1 lymph node cancer, 1 prostate cancer, 1 breast cancer at the age of 41).  GYNECOLOGIC HISTORY: She had menarche age 69 or 25.  She is GX, P1, first pregnancy to term at age 42. She stopped having periods approximately March 2013  SOCIAL HISTORY: She works in a Agricultural consultant.  Her  husband, Aquinnah Devin owns a family business called [employer].  They have been married 5 years.  They have a son, [name], age 43.  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  The patient is not a church attender.    ADVANCED DIRECTIVES: Not in place  HEALTH MAINTENANCE: History  Substance Use Topics  . Smoking status: Former Smoker    Quit date: 07/20/1996  . Smokeless tobacco: Never Used  . Alcohol Use: No     Colonoscopy:  PAP: Ch. Harper  Bone density:  Lipid panel:  Allergies  Allergen Reactions  . Tussionex Pennkinetic Er [Hydrocod Polst-Cpm Polst Er] Other (See Comments)    halucinations  . Codeine Other (See Comments)    unkown  . Morphine And Related Anxiety  anxiety  . Penicillins Rash    Current Outpatient Prescriptions  Medication Sig Dispense Refill  . b complex vitamins tablet Take 1 tablet by mouth daily.      . Biotin (RA BIOTIN) 1000 MCG tablet Take 1,000 mcg by mouth daily.      . Calcium Carbonate-Vitamin D (CALCIUM-VITAMIN D) 500-200 MG-UNIT per tablet Take 1 tablet by mouth 2 (two) times daily with a meal.      . doxycycline (DORYX) 100 MG EC tablet Take 100 mg by mouth 2 (two) times daily.      [name] doxycycline (VIBRAMYCIN) 100 MG capsule TAKE ONE CAPSULE BY MOUTH TWICE DAILY  30 capsule  5  . estradiol (VAGIFEM) 25 MCG vaginal tablet Place 1 tablet (25 mcg total) vaginally daily.  8 tablet  12  . fish oil-omega-3 fatty acids 1000 MG capsule Take 2 g by mouth daily.      . fluconazole (DIFLUCAN) 100 MG tablet Take 1 tablet (100 mg total) by mouth daily. X 7 days--take entire course of medication for efficacy. Call GYN for recurrent infection  7 tablet  0  . HYDROcodone-acetaminophen (NORCO) 5-325 MG per tablet Take 1 tablet by mouth every 6 (six) hours as needed for pain.  40 tablet  0  . ibuprofen (ADVIL,MOTRIN) 800 MG tablet Take 800 mg by mouth every 8 (eight) hours as  needed for pain.      [name] loratadine (CLARITIN) 10 MG tablet Take 10 mg by mouth daily.       [name] LORazepam (ATIVAN) 1 MG tablet TAKE ONE-HALF TABLET BY MOUTH AT BEDTIME AS NEEDED FOR ANXIETY  30 tablet  3  . Multiple Vitamins-Minerals (HAIR/SKIN/NAILS PO) Take by mouth daily.      . prenatal vitamin w/FE, FA (PRENATAL 1 + 1) 27-1 MG TABS Take 1 tablet by mouth Daily.      . tamoxifen (NOLVADEX) 20 MG tablet TAKE ONE TABLET BY MOUTH EVERY DAY  90 tablet  3   No current facility-administered medications for this visit.    OBJECTIVE: Middle-aged white woman who appears well  Filed Vitals:   01/13/13 1435  BP: 107/70  Pulse: 62  Temp: 98.7 F (37.1 C)  Resp: 20     Body mass index is 22.01 kg/(m^2).    ECOG FS: 0 Filed Weights   01/13/13 1435  Weight: 124 lb 3.2 oz (56.337 kg)   Sclerae unicteric, pupils equal round and reactive Oropharynx clear No cervical or supraclavicular adenopathy Lungs no rales or rhonchi Heart regular rate and rhythm Abd soft, nontender, positive bowel sounds, no masses palpated MSK no focal spinal tenderness, no peripheral edema Neuro: nonfocal, well-oriented, anxious affect Breasts: Status post bilateral mastectomies. There is no evidence of recurrence on the right, and the right axilla is benign. The left side is unremarkable.   LAB RESULTS: Lab Results  Component Value Date   WBC 5.5 01/08/2013   NEUTROABS 3.3 01/08/2013   HGB 13.7 01/08/2013   HCT 38.2 01/08/2013   MCV 90.5 01/08/2013   PLT 199 01/08/2013      Chemistry      Component Value Date/Time   NA 140 01/08/2013 1456   NA 141 09/23/2012 1415   K 4.4 01/08/2013 1456   K 3.6 09/23/2012 1415   CL 104 01/08/2013 1456   CL 104 09/23/2012 1415   CO2 27 01/08/2013 1456   CO2 28 09/23/2012 1415   BUN 13 01/08/2013 1456   BUN 13.0 09/23/2012 1415  CREATININE 0.77 01/08/2013 1456   CREATININE 0.8 09/23/2012 1415   CREATININE 0.79 11/02/2011 1357      Component Value Date/Time   CALCIUM 9.8 01/08/2013  1456   CALCIUM 9.3 09/23/2012 1415   ALKPHOS 47 01/08/2013 1456   ALKPHOS 58 09/23/2012 1415   AST 18 01/08/2013 1456   AST 16 09/23/2012 1415   ALT 13 01/08/2013 1456   ALT 14 09/23/2012 1415   BILITOT 0.5 01/08/2013 1456   BILITOT 0.42 09/23/2012 1415       STUDIES: No results found.  ASSESSMENT: 42 y.o.  BRCA 1-2 negative [name], [place] woman,   (1) status post right breast biopsy in September 2012 for a clinically 2 cm invasive ductal carcinoma, with some enlarged Right axillary lymph nodes, the largest one of which was negative by biopsy. The tumor was grade 2, estrogen receptor positive at 88%, progesterone receptor positive at 11%, HER-2/neu positive by CISH with a ratio of 4.61, and a proliferation marker of 36%.   (2) treated in the neoadjuvant setting with Q 3 week docetaxel/ carboplatin/ trastuzumab x6, completed 05/18/2011.  Continued on trastuzumab every 3 weeks to complete one year, last dose in November 2013.  (3) s/p bilateral mastectomies with sentinel node biopsy 06/29/2011 for a residual right-sided ypT1c ypN0 invasive ductal carcinoma, grade 2, with immediate expander placement  (4) on tamoxifen as of May 2013   PLAN:  Ralyn is doing well as far as her breast cancer is concerned, now 1-1/2 year out from her definitive surgery with no evidence of disease recurrence. She does have a variety of questionable symptoms, and I think it would be best to try to clear them once and for all that is my suggestion is that we do a PET scan and we will do that shortly before her next visit here, which will be in January. Assuming that is benign, we will plan no further restaging studies in the absence of specific symptoms to evaluate.  Today we talked about breathing exercises and especially chest breathing. I think this will be helpful to her when she starts feeling anxious. I refilled her Ativan at her request. Otherwise she will return to see [place] in January. I will see her again in  may and 2 weeks before the May visit we will check her LH FSH and estradiol. If she is postmenopausal we will consider switching to an aromatase inhibitor at that time. Jenan Ellegood C    01/13/2013

## 2013-01-13 NOTE — Telephone Encounter (Signed)
appts made and printed. Pt is aware that cs will call her w/ her appts for the PET...td

## 2013-01-15 ENCOUNTER — Other Ambulatory Visit: Payer: Self-pay | Admitting: Obstetrics

## 2013-01-15 ENCOUNTER — Ambulatory Visit (HOSPITAL_COMMUNITY)
Admission: RE | Admit: 2013-01-15 | Discharge: 2013-01-15 | Disposition: A | Discharge status: Home / Self Care | Payer: BC Managed Care – PPO | Source: Ambulatory Visit | Attending: Obstetrics | Admitting: Obstetrics

## 2013-01-15 DIAGNOSIS — C50919 Malignant neoplasm of unspecified site of unspecified female breast: Secondary | ICD-10-CM | POA: Insufficient documentation

## 2013-01-15 DIAGNOSIS — Z13820 Encounter for screening for osteoporosis: Secondary | ICD-10-CM | POA: Insufficient documentation

## 2013-01-15 DIAGNOSIS — N854 Malposition of uterus: Secondary | ICD-10-CM | POA: Insufficient documentation

## 2013-01-18 ENCOUNTER — Telehealth: Payer: Self-pay | Admitting: *Deleted

## 2013-01-18 MED ORDER — FLUCONAZOLE 150 MG PO TABS: 150.0000 mg | ORAL_TABLET | Freq: Once | ORAL | Status: DC

## 2013-01-18 NOTE — Telephone Encounter (Signed)
Call place to patient no answer. A detailed voice message was left informing patient of yeast infection and rx to pharmacy. A voice message was left for patient to call office if any questions.

## 2013-01-18 NOTE — Telephone Encounter (Signed)
Message copied by Glendell Docker on Sat Jan 18, 2013 12:14 PM ------      Message from: Coral Ceo A      Created: Thu Jan 09, 2013  9:40 AM       Diflucan 150 mg po       ------

## 2013-01-22 ENCOUNTER — Encounter: Payer: Self-pay | Admitting: Obstetrics

## 2013-01-30 NOTE — Telephone Encounter (Signed)
No entry 

## 2013-02-23 ENCOUNTER — Other Ambulatory Visit: Payer: Self-pay | Admitting: Obstetrics

## 2013-02-24 ENCOUNTER — Other Ambulatory Visit: Payer: Self-pay | Admitting: Obstetrics

## 2013-03-04 ENCOUNTER — Other Ambulatory Visit: Payer: Self-pay | Admitting: Oncology

## 2013-03-04 DIAGNOSIS — C50311 Malignant neoplasm of lower-inner quadrant of right female breast: Secondary | ICD-10-CM

## 2013-03-04 NOTE — Telephone Encounter (Signed)
Note script in the 01-13-2013 office note is for lorazepam 0.5 mg.  Script was printed but not given to patient.

## 2013-03-18 ENCOUNTER — Telehealth: Payer: Self-pay | Admitting: *Deleted

## 2013-03-18 NOTE — Telephone Encounter (Signed)
Pt called to cancel her appts for 05/02/13. She stated she would attend her appts for 08/2013....td

## 2013-03-19 ENCOUNTER — Encounter: Payer: Self-pay | Admitting: Obstetrics

## 2013-04-07 ENCOUNTER — Encounter (HOSPITAL_COMMUNITY)
Admission: RE | Admit: 2013-04-07 | Discharge: 2013-04-07 | Disposition: A | Discharge status: Home / Self Care | Payer: BC Managed Care – PPO | Source: Ambulatory Visit | Attending: Oncology | Admitting: Oncology

## 2013-04-07 ENCOUNTER — Other Ambulatory Visit (HOSPITAL_COMMUNITY): Payer: BC Managed Care – PPO

## 2013-04-07 DIAGNOSIS — C50311 Malignant neoplasm of lower-inner quadrant of right female breast: Secondary | ICD-10-CM

## 2013-04-07 DIAGNOSIS — C50919 Malignant neoplasm of unspecified site of unspecified female breast: Secondary | ICD-10-CM | POA: Insufficient documentation

## 2013-04-07 DIAGNOSIS — R229 Localized swelling, mass and lump, unspecified: Secondary | ICD-10-CM | POA: Insufficient documentation

## 2013-04-07 DIAGNOSIS — Z978 Presence of other specified devices: Secondary | ICD-10-CM | POA: Insufficient documentation

## 2013-04-07 DIAGNOSIS — Z9010 Acquired absence of unspecified breast and nipple: Secondary | ICD-10-CM | POA: Insufficient documentation

## 2013-04-07 LAB — GLUCOSE, CAPILLARY: Glucose-Capillary: 85 mg/dL (ref 70–99)

## 2013-04-07 MED ORDER — FLUDEOXYGLUCOSE F - 18 (FDG) INJECTION
16.7000 | Freq: Once | INTRAVENOUS | Status: AC | PRN
  Administered 2013-04-07: 16.7 via INTRAVENOUS

## 2013-04-08 ENCOUNTER — Other Ambulatory Visit: Payer: Self-pay | Admitting: Oncology

## 2013-04-08 ENCOUNTER — Ambulatory Visit
Admission: RE | Admit: 2013-04-08 | Discharge: 2013-04-08 | Disposition: A | Discharge status: Home / Self Care | Payer: BC Managed Care – PPO | Source: Ambulatory Visit | Attending: Oncology | Admitting: Oncology

## 2013-04-08 DIAGNOSIS — C50919 Malignant neoplasm of unspecified site of unspecified female breast: Secondary | ICD-10-CM

## 2013-04-09 ENCOUNTER — Ambulatory Visit
Admission: RE | Admit: 2013-04-09 | Discharge: 2013-04-09 | Disposition: A | Discharge status: Home / Self Care | Payer: BC Managed Care – PPO | Source: Ambulatory Visit | Attending: Oncology | Admitting: Oncology

## 2013-04-09 DIAGNOSIS — C50919 Malignant neoplasm of unspecified site of unspecified female breast: Secondary | ICD-10-CM

## 2013-04-12 ENCOUNTER — Other Ambulatory Visit: Payer: Self-pay | Admitting: Oncology

## 2013-04-14 ENCOUNTER — Other Ambulatory Visit: Payer: Self-pay | Admitting: Oncology

## 2013-04-14 NOTE — Progress Notes (Unsigned)
It looks like they does have a local recurrence to the right breast. She has had recent extensive restaging studies showing no disease elsewhere. She is a ready meeting with Dr. Dalbert Batman January 7. However her plastic surgeon is Dr. Berline Lopes in [place].  I am tentatively making an appointment for her to see me the last Friday in January. By that time we should have all the information necessary to make a decision regarding what adjuvant treatment would be most appropriate. I also for extra copy of her path report.

## 2013-04-15 ENCOUNTER — Telehealth: Payer: Self-pay | Admitting: *Deleted

## 2013-04-15 ENCOUNTER — Other Ambulatory Visit: Payer: Self-pay | Admitting: *Deleted

## 2013-04-15 NOTE — Telephone Encounter (Signed)
Left vm for pt to return call to discuss prognostic panel.

## 2013-04-15 NOTE — Telephone Encounter (Signed)
sw pt gv appt for 05/09/13 @ 9am. Pt wants to know if she could come in @ 10am on this day. i emailed [facility] to get an answer...td

## 2013-04-16 ENCOUNTER — Ambulatory Visit (INDEPENDENT_AMBULATORY_CARE_PROVIDER_SITE_OTHER): Payer: BC Managed Care – PPO | Admitting: General Surgery

## 2013-04-16 ENCOUNTER — Encounter (INDEPENDENT_AMBULATORY_CARE_PROVIDER_SITE_OTHER): Payer: Self-pay | Admitting: General Surgery

## 2013-04-16 ENCOUNTER — Telehealth: Payer: Self-pay | Admitting: *Deleted

## 2013-04-16 ENCOUNTER — Encounter (INDEPENDENT_AMBULATORY_CARE_PROVIDER_SITE_OTHER): Payer: Self-pay

## 2013-04-16 VITALS — BP 118/70 | HR 76 | Temp 98.4°F | Resp 14 | Ht 63.0 in | Wt 124.0 lb

## 2013-04-16 DIAGNOSIS — C50911 Malignant neoplasm of unspecified site of right female breast: Secondary | ICD-10-CM

## 2013-04-16 DIAGNOSIS — C50919 Malignant neoplasm of unspecified site of unspecified female breast: Secondary | ICD-10-CM | POA: Insufficient documentation

## 2013-04-16 NOTE — Patient Instructions (Addendum)
You have developed a local recurrence of your right breast cancer in the medial aspect of your right breast reconstruction. This involves the skin and subcutaneous tissue. There is no evidence of cancer elsewhere.  While this is unfortunate, there are lots of options for treating you with the expectation that we will have a good outcome.  He will be scheduled for surgery to completely excise this recurrent breast cancer. We will be attempting to completely remove this with a negative margin.  I have discussed this treatment plan with Dr. Nicki Reaper Dr. Nanetta Batty her plastic surgeon, and with Dr. Jana Hakim incompetence morning. This is our consensus advice and recommendation.

## 2013-04-16 NOTE — Telephone Encounter (Signed)
sw pt informed her that it's ok for her to come in on 05/09/13@ 10am. Pt is aware...td

## 2013-04-16 NOTE — Progress Notes (Signed)
Patient ID: [name], female   DOB: 06/22/1970, 43 y.o.   MRN: [id number]  Chief Complaint  Patient presents with  . New Evaluation    breast cancer    HPI [name] is a 43 y.o. female.  She is referred back to me by Dr. Lillia Mountain for evaluation and management of a local recurrence of her right breast cancer.She is here today with her husband.   She initially presented with locally advanced cancer of the right breast,lower inner quadrant in Sept., 2012.. She underwent neoadjuvant chemotherapy. On 06/29/2011 she underwent right total mastectomy and sentinel node biopsy and left total mastectomy and tissue expanders. The right breast revealed invasive ductal carcinoma, ER-positive, HER-2 positive. Pathologic stage ypT1c, yp N0.  Right breast margins negative, 0.8 cm posteriorly.Left breast benign.  BRCA testing was negative. She received a year of Herceptin chemotherapy. On 11/23/2011 Dr. Migdalia Dk removed her expanders and replaced with implants.She has subsequently had those implants removed, new implants placed, back injections performed, and nipple reconstruction performed in August and October of last year by Dr. [name] at [facility] plastic surgery in [place].  She has been followed closely by Dr. Jana Hakim. PET-CT scan was performed on 04/07/2013. This shows mildly hypermetabolic subcutaneous nodule medially in the right breast said to be nonspecific. Subsequent ultrasound showed a 7 mm nodule in the right breast at the 4:00 position, 4 cm from the reconstructed nipple. The right axilla did not show any adenopathy. Ultrasound-guided biopsy was performed and it shows invasive ductal carcinoma. Breast diagnostic profile pending. Her case was discussed in breast conference this morning. Dr. Jana Hakim and Dr. Migdalia Dk and I  were there. It is felt she has a local recurrence but no signs of any metastatic disease. Consensus recommendations were local excision of the nodule,   plastic surgical consultation regarding management of her reconstruction, referral to medical oncology and radiation oncology, and referral to genetics again for expanded  genetic evaluation.  She has already been contacted by Dr. Jana Hakim and  an appointment is made. HPI  Past Medical History  Diagnosis Date  . Migraines   . Asthma     as a child  . Breast cancer Sept 2012    right breast  . PONV (postoperative nausea and vomiting)   . Rash     neck    Past Surgical History  Procedure Laterality Date  . Rhinoplasty    . Adenoidectomy    . Fibroid tumor surgery    . Dilation and curettage of uterus    . Uterine cervical treatments  2002    for precancerous lesions  . Lazy eye      corrective surgery  . Mastectomy w/ sentinel node biopsy  06/29/2011    Procedure: MASTECTOMY WITH SENTINEL LYMPH NODE BIOPSY;  Surgeon: Adin Hector, MD;  Location: [place];  Service: General;  Laterality: Right;  right skin spairing mstectomy and right sentinel lymph node biopsy  . Breast reconstruction  06/29/2011    Procedure: BREAST RECONSTRUCTION;  Surgeon: Theodoro Kos, DO;  Location: [place];  Service: Plastics;  Laterality: Bilateral;  Immediate Bilateral Breast Reconstruction with Bilateral Tissue Expanders and placement of  Alloderm   . Port-a-cath removal  03/12/2012    Procedure: MINOR REMOVAL PORT-A-CATH;  Surgeon: Adin Hector, MD;  Location: [place];  Service: General;  Laterality: N/A;  Upper left    Family History  Problem Relation Age of Onset  . Adopted: Yes  .  Heart attack Father   . Cancer Brother     prostate and lung  . Cancer Cousin     melanoma  . Cancer Sister     breast  . Cancer Sister     stomach, ovarian    Social History History  Substance Use Topics  . Smoking status: Former Smoker    Quit date: 07/20/1996  . Smokeless tobacco: Never Used  . Alcohol Use: No    Allergies  Allergen Reactions  . Tussionex Pennkinetic Er [Hydrocod  Polst-Cpm Polst Er] Other (See Comments)    halucinations  . Codeine Other (See Comments)    unkown  . Morphine And Related Anxiety    anxiety  . Penicillins Rash    Current Outpatient Prescriptions  Medication Sig Dispense Refill  . b complex vitamins tablet Take 1 tablet by mouth daily.      . Biotin (RA BIOTIN) 1000 MCG tablet Take 1,000 mcg by mouth daily.      . Calcium Carbonate-Vitamin D (CALCIUM-VITAMIN D) 500-200 MG-UNIT per tablet Take 1 tablet by mouth 2 (two) times daily with a meal.      . fish oil-omega-3 fatty acids 1000 MG capsule Take 2 g by mouth daily.      . fluconazole (DIFLUCAN) 150 MG tablet TAKE BY MOUTH AS A ONE TIME DOSE  1 tablet  0  . fluconazole (DIFLUCAN) 150 MG tablet TAKE BY MOUTH AS A ONE TIME DOSE  1 tablet  2  . ibuprofen (ADVIL,MOTRIN) 800 MG tablet Take 800 mg by mouth every 8 (eight) hours as needed for pain.      [name] loratadine (CLARITIN) 10 MG tablet Take 10 mg by mouth daily.       [name] LORazepam (ATIVAN) 0.5 MG tablet Take one-half to one tablet by mouth at bedtime as needed for anxiety  30 tablet  0  . Multiple Vitamins-Minerals (HAIR/SKIN/NAILS PO) Take by mouth daily.      . prenatal vitamin w/FE, FA (PRENATAL 1 + 1) 27-1 MG TABS Take 1 tablet by mouth Daily.      . tamoxifen (NOLVADEX) 20 MG tablet TAKE ONE TABLET BY MOUTH EVERY DAY  90 tablet  3   No current facility-administered medications for this visit.    Review of Systems Review of Systems  Constitutional: Negative for fever, chills and unexpected weight change.  HENT: Negative for congestion, hearing loss, sore throat, trouble swallowing and voice change.   Eyes: Negative for visual disturbance.  Respiratory: Negative for cough and wheezing.   Cardiovascular: Negative for chest pain, palpitations and leg swelling.  Gastrointestinal: Negative for nausea, vomiting, abdominal pain, diarrhea, constipation, blood in stool, abdominal distention and anal bleeding.  Genitourinary: Negative  for hematuria, vaginal bleeding and difficulty urinating.  Musculoskeletal: Negative for arthralgias.  Skin: Negative for rash and wound.  Neurological: Negative for seizures, syncope and headaches.  Hematological: Negative for adenopathy. Does not bruise/bleed easily.  Psychiatric/Behavioral: Negative for confusion.    There were no vitals taken for this visit.  Physical Exam Physical Exam  Constitutional: She is oriented to person, place, and time. She appears well-developed and well-nourished. No distress.  HENT:  Head: Normocephalic and atraumatic.  Nose: Nose normal.  Mouth/Throat: No oropharyngeal exudate.  Eyes: Conjunctivae and EOM are normal. Pupils are equal, round, and reactive to light. Left eye exhibits no discharge. No scleral icterus.  Neck: Neck supple. No JVD present. No tracheal deviation present. No thyromegaly present.  Cardiovascular: Normal rate, regular rhythm,  normal heart sounds and intact distal pulses.   No murmur heard. Pulmonary/Chest: Effort normal and breath sounds normal. No respiratory distress. She has no wheezes. She has no rales. She exhibits no tenderness.    Bilateral mastectomy, implant Reconstruction, nipple reconstruction with excellent cosmetic result. At the 3:00 position of the right breast there is a biopsy site, some bruising, and a localized area of vague  thickening. No hematoma. There are no other nodules or ulcerations anywhere on the anterior chest wall. There is no axillary adenopathy.  Abdominal: Soft. Bowel sounds are normal. She exhibits no distension and no mass. There is no tenderness. There is no rebound and no guarding.  Musculoskeletal: She exhibits no edema and no tenderness.  Lymphadenopathy:    She has no cervical adenopathy.  Neurological: She is alert and oriented to person, place, and time. She exhibits normal muscle tone. Coordination normal.  Skin: Skin is warm. No rash noted. She is not diaphoretic. No erythema. No  pallor.  Psychiatric: She has a normal mood and affect. Her behavior is normal. Judgment and thought content normal.    Data Reviewed I have reviewed her CAT scan, ultrasound, image guided histology results. I have discussed her case at breast conference this morning. I have discussed her case with Dr. Danelle Earthly on the phone today.  Assessment    Local recurrence, right breast cancer, 3:00 position. This is a small subcentimeter recurrence interposed between the skin and the implant capsule. There is no evidence of cancer elsewhere.  History of locally advanced cancer right breast(diagnosis Sept., 2012), status post neoadjuvant chemotherapy, subsequent bilateral mastectomy(06/29/2011) and right sentinel node biopsy for invasive ductal carcinoma, receptor positive, HER-2 positive, BRCA negative. Pathologic stage ypT1c, ypN0 with negative margins.  Status post one year of Herceptin chemotherapy   Status post multiple plastic surgical procedures as above, resulting in excellent cosmetic result.     Plan    The patient, her husband and I had a very long discussion. One hour and 15 minutes.  My recommendation was wide local excision of the subcutaneous recurrence with a transverse elliptical incision, hopefully preserving the implant. She knows that this will tighten the skin up some  Following that, decisions will need to be made regarding adjuvant radiation therapy and adjuvant chemotherapy. She has been followed by Dr. Jana Hakim and Dr. Isidore Moos in the past  Dr. Nigel Berthold Recommendation was for me to proceed with whatever was necessary to control her cancer. He will  stand by and once all of the treatments were completed, if there was any deformity he could revise the reconstruction as necessary.  She will be scheduled for excision of recurrent right breast cancer with needle localization and general anesthesia in the near future.  I discussed the indications, details, technique and  numerous risk of the surgery with the patient and her husband at length. They are aware of the risk of bleeding, infection, positive margins necessitating further surgery, damage to the implant necessitating removal and/or replacement, and other unforseen problems. They understand all these issues. All of their questions were answered. They agree with this plan.       Edsel Petrin. Dalbert Batman, M.D., [facility] General and Minimally invasive Surgery Breast and Colorectal Surgery Office:   [phone] Pager:   [phone]  04/16/2013, 3:42 PM

## 2013-04-17 ENCOUNTER — Other Ambulatory Visit: Payer: Self-pay | Admitting: Oncology

## 2013-04-17 ENCOUNTER — Telehealth: Payer: Self-pay | Admitting: *Deleted

## 2013-04-17 DIAGNOSIS — C50919 Malignant neoplasm of unspecified site of unspecified female breast: Secondary | ICD-10-CM

## 2013-04-18 ENCOUNTER — Other Ambulatory Visit: Payer: Self-pay | Admitting: Oncology

## 2013-04-18 ENCOUNTER — Other Ambulatory Visit (INDEPENDENT_AMBULATORY_CARE_PROVIDER_SITE_OTHER): Payer: Self-pay | Admitting: General Surgery

## 2013-04-18 ENCOUNTER — Telehealth (INDEPENDENT_AMBULATORY_CARE_PROVIDER_SITE_OTHER): Payer: Self-pay

## 2013-04-18 ENCOUNTER — Telehealth: Payer: Self-pay | Admitting: *Deleted

## 2013-04-18 DIAGNOSIS — C50911 Malignant neoplasm of unspecified site of right female breast: Secondary | ICD-10-CM

## 2013-04-18 NOTE — Telephone Encounter (Signed)
CALLED PATIENT TO INFORM OF [place] APPT., LVM FOR A RETURN CALL

## 2013-04-18 NOTE — Progress Notes (Unsigned)
I called [name] to discuss results of her pathology, which show recurrent breast cancer, again triple positive. She has met with Dr. Dalbert Batman to discuss surgery and will also meet with Dr. Berline Lopes, her plastic surgeon at [facility]. The big question that was discussed during conference year was whether or not she will need to have the implant replacement or whether adequate margins can be obtained without removing the implant while preserving cosmesis. She needs radiation oncology referral and I have placed at with Dr. Lyn Henri.  Once she has her surgery, she will need additional systemic treatments and I am considering TDM-1 for 6 months. I would not do this concurrent with radiation, but if the radiation treatment is abbreviated I would probably do that first, and then followup with the TDM 1.   the patient has an appointment with me January 30. Hopefully by that time she will have had her surgery and we will be able to start thinking about further treatment. She knows to call for any problems that may develop before that visit.

## 2013-04-18 NOTE — Telephone Encounter (Signed)
I left a message for the pt to call Monday morning.  I want to let her know we will do the port a cath same day along with the labs.  I have her prescription ready for pick up on Monday.

## 2013-04-18 NOTE — Telephone Encounter (Signed)
We will put a Port-A-Cath in the same day as the breast cancer excision. I will write a prescription for Vicodin that she can pick up preop. She can have same-day labs.  Please*she has any further questions or would like me to call her.  hmi

## 2013-04-18 NOTE — Telephone Encounter (Signed)
I called and spoke to pt.  She is requesting her labs be drawn the same day as her surgery.  She lives in [place] which is a long drive to [place].  I told her Dr Dalbert Batman typically does not order same day labs but I will let him know her request.  She is trying to reduce the amount of trips she has to make.  She also states her prognostic markers are in and Dr Jana Hakim informed her she will get Herceptin and chemo.  She will need a port a cath and wants it the same day as her other surgery.  Her surgery is 1/26.  She had asked for her pain medicine prescription to be written ahead of time for after surgery.  She wants Vicodin instead of Percocet.  She would like to be informed of Dr Darrel Hoover answers to her requests.  She also requested a copy of her office note from 1//10/2013 and gave me the fax # of [phone].  I asked medical records to send that for her.

## 2013-04-18 NOTE — Telephone Encounter (Signed)
[name], Since we are going to put the Port-A-Cath in [name] the same day as we excised the cancer, her surgery will take at least 2 hours. Please adjust the afternoon office as necessary.  hmi

## 2013-04-21 ENCOUNTER — Telehealth: Payer: Self-pay | Admitting: Oncology

## 2013-04-21 NOTE — Telephone Encounter (Signed)
s.w pt and advised Pt aware of radiation appt on 1.15.15 with Dr. Valere Dross

## 2013-04-23 ENCOUNTER — Encounter (HOSPITAL_COMMUNITY): Payer: Self-pay | Admitting: Respiratory Therapy

## 2013-04-24 ENCOUNTER — Encounter: Payer: Self-pay | Admitting: Radiation Oncology

## 2013-04-24 ENCOUNTER — Ambulatory Visit
Admission: RE | Admit: 2013-04-24 | Discharge: 2013-04-24 | Disposition: A | Discharge status: Home / Self Care | Payer: BC Managed Care – PPO | Source: Ambulatory Visit | Attending: Radiation Oncology | Admitting: Radiation Oncology

## 2013-04-24 ENCOUNTER — Ambulatory Visit: Payer: BC Managed Care – PPO | Admitting: Radiation Oncology

## 2013-04-24 VITALS — BP 99/64 | HR 60 | Temp 98.2°F | Resp 16 | Ht 63.0 in | Wt 125.1 lb

## 2013-04-24 DIAGNOSIS — C50919 Malignant neoplasm of unspecified site of unspecified female breast: Secondary | ICD-10-CM

## 2013-04-24 DIAGNOSIS — J45909 Unspecified asthma, uncomplicated: Secondary | ICD-10-CM | POA: Insufficient documentation

## 2013-04-24 DIAGNOSIS — Z87891 Personal history of nicotine dependence: Secondary | ICD-10-CM | POA: Insufficient documentation

## 2013-04-24 DIAGNOSIS — R234 Changes in skin texture: Secondary | ICD-10-CM | POA: Insufficient documentation

## 2013-04-24 DIAGNOSIS — Z79899 Other long term (current) drug therapy: Secondary | ICD-10-CM | POA: Insufficient documentation

## 2013-04-24 DIAGNOSIS — Z978 Presence of other specified devices: Secondary | ICD-10-CM | POA: Insufficient documentation

## 2013-04-24 DIAGNOSIS — Z170 Estrogen receptor positive status [ER+]: Secondary | ICD-10-CM | POA: Insufficient documentation

## 2013-04-24 DIAGNOSIS — Z9010 Acquired absence of unspecified breast and nipple: Secondary | ICD-10-CM | POA: Insufficient documentation

## 2013-04-24 NOTE — Progress Notes (Signed)
Location of Breast Cancer Right breast lower inner quadrant 4 o'clock  04/09/13  1st dx  12/23/10:Breast, right, needle core biopsy, mass 5 o'clock - INVASIVE DUCTAL CARCINOMA, GRADE II, SEE COMMENT  Histology per Pathology Report: Diagnosis 04/09/13: Breast, right, needle core biopsy, 4 o'clock- INVASIVE DUCTAL CARCINOMA. PLEASE SEE COMMENT.  Receptor Status: ER( ++ ), PR (  ), Her2-neu   (  +) BRCA neg.  Did patient present with symptoms (if so, please note symptoms) or was this found on screening mammography?:   Past/Anticipated interventions by surgeon, if any: Diagnosis 06/29/11: 1. Breast, simple mastectomy, Left- BENIGN FIBROCYSTIC CHANGES.- NO MALIGNANCY IDENTIFIED. ONE BENIGN LYMPH NODE (0/1). 2. Breast, simple mastectomy, Right- INVASIVE AND IN SITU DUCTAL CARCINOMA, 1.2 CM, ASSOCIATED WITH FIBROSIS.- MARGIN NOT INVOLVED.3. Lymph node, sentinel, biopsy, Right axillary- ONE BENIGN LYMPH NODE (0/1).4. Lymph node, sentinel, biopsy, Right axillary #2- ONE BENIGN LYMPH NODE (0/1).5. Lymph node, sentinel, biopsy, Right axillary #3- ONE BENIGN LYMPH NODE (0/1).  Past/Anticipated interventions by medical oncology, if any: Chemotherapy s/p 1 year herceptin, on tamoxifen 43m daily   Lymphedema issues, if any: no  Pain issues, if aFQM:KJIZXright axilla occasional dull ache, double mastectomy has had implants replaced with nipple reconstruction  August and October DR. SManning Charityat WBude  Prior radiation?  no  Pacemaker/ICD? no  Possible current pregnancy?no  Is the patient on methotrexate? no  Current Complaints / other details:  Father MI, brother prostate and lung ca, 1 sister breast ca, 644'sdeceased,  2nd sister stomach ovarian ca, deceased,64,   1 cousin  Female, melanoma     MRebecca Eaton RN 04/24/2013,1:28 PM

## 2013-04-24 NOTE — Progress Notes (Signed)
[facility] NEW PATIENT EVALUATION  Name: [name] MRN: [id number]  Date:   04/24/2013           DOB: [birth year]  Status: outpatient   CC: Chauncey Cruel, MD  Dr. Fanny Skates   REFERRING PHYSICIAN: Magrinat, Virgie Dad, MD   DIAGNOSIS:  Recurrent invasive ductal carcinoma the right breast, subcutaneous recurrence  HISTORY OF PRESENT ILLNESS:  [name] is a 43 y.o. female who is seen today through the courtesy of Dr. Jana Hakim for evaluation of her recurrent invasive ductal carcinoma the right breast. She presented with a palpable right breast mass at approximately 5:00, 5 cm from the nipple. Ultrasound showed a 1.7 cm mass. There was a 1.8 cm right axillary lymph node with a thickened cortex. Right breast biopsy on 12/23/2010 was diagnostic for invasive ductal carcinoma Right axillary node biopsy was negative. She will on to receive neoadjuvant chemotherapy followed by bilateral mastectomies and right sentinel lymph node biopsies. She  had a 1.2 cm invasive and in situ ductal carcinoma within the right breast and 3 sentinel lymph nodes were free of metastatic disease. Her primary tumor was ER positive at 80%, PR positive at 11% with an elevated Ki-67 of 36% along with HER-2/neu positivity. She had placement of tissue expanders by Dr. Migdalia Dk and placement of permanent implants on 11/23/2011. She will onto receive adjuvant chemotherapy through Dr. Jana Hakim which included Herceptin. She later underwent revision of her implants with Dr. [name] in [place] with nipple reconstruction and fat injections. Followup breast MR in September was without evidence for recurrent disease. A PET scan was performed on 04/07/2013 and this showed a small mildly hypermetabolic subcutaneous nodule medially in the right breast at approximately 4 to 5:00. A subsequent ultrasound showed an indeterminate nodule in the 4:00 position measuring 0.7 x 0.5 x 0.5 cm. An  ultrasound-guided biopsy on 04/09/2013 was diagnostic for invasive ductal carcinoma, ER positive at 94%, PR positive at 11% with a proliferation marker of 19%, and also HER-2/neu positive. She was recently presented at the Wednesday morning conference and it was recommended that she undergo local excision by Dr. Dalbert Batman, followed by chest wall radiation therapy. Dr. Jana Hakim also recommends adjuvant chemotherapy. She remains quite nervous.  PREVIOUS RADIATION THERAPY: No   PAST MEDICAL HISTORY:  has a past medical history of Migraines; Asthma; Breast cancer (Sept 2012); PONV (postoperative nausea and vomiting); and Rash.     PAST SURGICAL HISTORY:  Past Surgical History  Procedure Laterality Date  . Rhinoplasty    . Adenoidectomy    . Fibroid tumor surgery    . Dilation and curettage of uterus    . Uterine cervical treatments  2002    for precancerous lesions  . Lazy eye      corrective surgery  . Mastectomy w/ sentinel node biopsy  06/29/2011    Procedure: MASTECTOMY WITH SENTINEL LYMPH NODE BIOPSY;  Surgeon: Adin Hector, MD;  Location: [facility];  Service: General;  Laterality: Right;  right skin spairing mstectomy and right sentinel lymph node biopsy  . Breast reconstruction  06/29/2011    Procedure: BREAST RECONSTRUCTION;  Surgeon: Theodoro Kos, DO;  Location: [place];  Service: Plastics;  Laterality: Bilateral;  Immediate Bilateral Breast Reconstruction with Bilateral Tissue Expanders and placement of  Alloderm   . Port-a-cath removal  03/12/2012    Procedure: MINOR REMOVAL PORT-A-CATH;  Surgeon: Adin Hector, MD;  Location: [facility];  Service: General;  Laterality: N/A;  Upper left     FAMILY HISTORY: family history includes Cancer in her brother, cousin, sister, and sister; Heart attack in her father. She was adopted. Her biological father died of a heart attack in his 58s. Her biological mother is alive and recently well at 44. No family history of breast cancer.    SOCIAL HISTORY:  reports that she quit smoking about 16 years ago. She has never used smokeless tobacco. She reports that she does not drink alcohol or use illicit drugs.  She is married with a 61-year-old son. She works for [employer]: Tussionex pennkinetic er; Codeine; Morphine and related; and Penicillins   MEDICATIONS:  Current Outpatient Prescriptions  Medication Sig Dispense Refill  . Acetaminophen (TYLENOL PO) Take 0.5 tablets by mouth as needed. tyleno with codeine      . aspirin-acetaminophen-caffeine (EXCEDRIN MIGRAINE) 250-250-65 MG per tablet Take 2 tablets by mouth every 6 (six) hours as needed for headache.      . b complex vitamins tablet Take 1 tablet by mouth daily.      . Biotin 5000 MCG CAPS Take 5,000 mcg by mouth daily.      . Calcium Carbonate-Vitamin D (CALCIUM-VITAMIN D) 500-200 MG-UNIT per tablet Take 1 tablet by mouth 2 (two) times daily with a meal.      . LORazepam (ATIVAN) 0.5 MG tablet Take 0.5 mg by mouth daily as needed for anxiety.      . Multiple Vitamins-Minerals (HAIR/SKIN/NAILS PO) Take 1 tablet by mouth daily.       . prenatal vitamin w/FE, FA (PRENATAL 1 + 1) 27-1 MG TABS Take 1 tablet by mouth Daily.      . tamoxifen (NOLVADEX) 20 MG tablet Take 20 mg by mouth daily.       No current facility-administered medications for this encounter.     REVIEW OF SYSTEMS:  Pertinent items are noted in HPI.    PHYSICAL EXAM:  height is 5' 3"  (1.6 m) and weight is 125 lb 1.6 oz (56.745 kg). Her oral temperature is 98.2 F (36.8 C). Her blood pressure is 99/64 and her pulse is 60. Her respiration is 16.   Alert and oriented 43 year old white female appearing her stated age. Head and neck examination: Grossly unremarkable. Nodes: Without palpable cervical, supraclavicular, or axillary lymphadenopathy. I can palpate a 0.5-0.7 cm right axillary node which feels benign. Chest: Bilateral breast reconstructions. There is ill-defined  thickening at approximately 4:00 along the medial aspect of the right breast with adjacent ecchymosis from her biopsy. Otherwise no masses are appreciated. Abdomen without hepatomegaly. Extremities: Without edema.   LABORATORY DATA:  Lab Results  Component Value Date   WBC 5.5 01/08/2013   HGB 13.7 01/08/2013   HCT 38.2 01/08/2013   MCV 90.5 01/08/2013   PLT 199 01/08/2013   Lab Results  Component Value Date   NA 140 01/08/2013   K 4.4 01/08/2013   CL 104 01/08/2013   CO2 27 01/08/2013   Lab Results  Component Value Date   ALT 13 01/08/2013   AST 18 01/08/2013   ALKPHOS 47 01/08/2013   BILITOT 0.5 01/08/2013      IMPRESSION: Recurrent invasive ductal carcinoma the right breast. This should be managed as an isolated "chest wall recurrence" which requires surgical excision followed by radiation therapy to the chest wall (and reconstructed breast). I understand that she will be offered a further adjuvant chemotherapy, and therefore radiation therapy should follow  further chemotherapy. We discussed the potential acute and late toxicities of radiation therapy including implant capsular fibrosis. I do not feel that we need to perform comprehensive nodal irradiation, but I would treat her with "high tangents" to encompass the lower/mid axilla.   PLAN: As discussed above. She will move ahead with surgery with Dr. Dalbert Batman and adjuvant chemotherapy with Dr. Jana Hakim. I asked that Dr. Jana Hakim contact me to schedule a followup visit near completion of adjuvant chemotherapy.  I spent 60 minutes minutes face to face with the patient and more than 50% of that time was spent in counseling and/or coordination of care.

## 2013-05-02 ENCOUNTER — Telehealth (INDEPENDENT_AMBULATORY_CARE_PROVIDER_SITE_OTHER): Payer: Self-pay

## 2013-05-02 ENCOUNTER — Other Ambulatory Visit (INDEPENDENT_AMBULATORY_CARE_PROVIDER_SITE_OTHER): Payer: Self-pay | Admitting: General Surgery

## 2013-05-02 ENCOUNTER — Other Ambulatory Visit (HOSPITAL_COMMUNITY): Payer: Self-pay | Admitting: *Deleted

## 2013-05-02 ENCOUNTER — Other Ambulatory Visit: Payer: BC Managed Care – PPO

## 2013-05-02 ENCOUNTER — Ambulatory Visit: Payer: BC Managed Care – PPO | Admitting: Physician Assistant

## 2013-05-02 ENCOUNTER — Encounter (HOSPITAL_COMMUNITY): Payer: Self-pay | Admitting: *Deleted

## 2013-05-02 DIAGNOSIS — C50911 Malignant neoplasm of unspecified site of right female breast: Secondary | ICD-10-CM

## 2013-05-02 NOTE — Progress Notes (Signed)
[facility] Psychosocial Distress Screening Clinical Social Work  Clinical Social Work was referred by distress screening protocol.  The patient scored a 5 on the Psychosocial Distress Thermometer which indicates moderate distress. Clinical Social Worker Intern phoned to assess for distress and other psychosocial needs. CSWI left voice message offering services and for Patient to return call if further assistance was needed.   Clinical Social Worker follow up needed: no  If yes, follow up plan:   [name] S. [place] Work Intern [employer] [phone]

## 2013-05-02 NOTE — Telephone Encounter (Signed)
Message copied by Gweneth Fritter on Fri May 02, 2013  8:46 AM ------      Message from: Tennis Ship      Created: Thu May 01, 2013  4:54 PM       Pt has questions about her sx Monday. Is she having same day labs? Please call her back.            Thanks,      Steph ------

## 2013-05-02 NOTE — Telephone Encounter (Signed)
I called the pt and spoke to her.  She wants to make sure she is going to have her port a cath put in Monday and that she will get same day labs.  I told her we have set it up to have her get her port put in during surgery.  I called [phone] and spoke to [facility] at presurgical [facility] and made sure they are aware she should be same day labs and she confirmed they will do it.

## 2013-05-04 MED ORDER — CEFAZOLIN SODIUM-DEXTROSE 2-3 GM-% IV SOLR
2.0000 g | INTRAVENOUS | Status: AC
  Administered 2013-05-05: 2 g via INTRAVENOUS
  Filled 2013-05-04: qty 50

## 2013-05-04 MED ORDER — CHLORHEXIDINE GLUCONATE 4 % EX LIQD: 1.0000 "application " | Freq: Once | CUTANEOUS | Status: DC

## 2013-05-04 NOTE — H&P (Signed)
[name]   MRN:  [id number]   Description: 43 year old female  Provider: Adin Hector, MD  Department: [facility]         Diagnoses      Recurrent breast cancer, right    -  Primary      174.9            Current Vitals - Last Recorded      BP Pulse Temp(Src) Resp Ht Wt      118/70 76 98.4 F (36.9 C) 14 5' 3" (1.6 m) 124 lb (56.246 kg)     BMI - 21.97 kg/m2                    History and Physical     Adin Hector, MD     Status: Signed            Patient ID: [name], female   DOB: 1970/09/24, 43 y.o.   MRN: [id number]              HPI [name] is a 43 y.o. female.  She is referred back to me by Dr. Lillia Mountain for evaluation and management of a local recurrence of her right breast cancer.She is here today with her husband.    She initially presented with locally advanced cancer of the right breast,lower inner quadrant in Sept., 2012.. She underwent neoadjuvant chemotherapy. On 06/29/2011 she underwent right total mastectomy and sentinel node biopsy and left total mastectomy and tissue expanders. The right breast revealed invasive ductal carcinoma, ER-positive, HER-2 positive. Pathologic stage ypT1c, yp N0.  Right breast margins negative, 0.8 cm posteriorly.Left breast benign.   BRCA testing was negative. She received a year of Herceptin chemotherapy. On 11/23/2011 Dr. Migdalia Dk removed her expanders and replaced with implants.She has subsequently had those implants removed, new implants placed, back injections performed, and nipple reconstruction performed in August and October of last year by Dr. [name] at [facility] plastic surgery in [place].   She has been followed closely by Dr. Jana Hakim. PET-CT scan was performed on 04/07/2013. This shows mildly hypermetabolic subcutaneous nodule medially in the right breast said to be nonspecific. Subsequent ultrasound showed a 7 mm nodule in the right breast at the 4:00 position,  4 cm from the reconstructed nipple. The right axilla did not show any adenopathy. Ultrasound-guided biopsy was performed and it shows invasive ductal carcinoma. Breast diagnostic profile pending. Her case was discussed in breast conference this morning. Dr. Jana Hakim and Dr. Migdalia Dk and I  were there. It is felt she has a local recurrence but no signs of any metastatic disease. Consensus recommendations were local excision of the nodule,  plastic surgical consultation regarding management of her reconstruction, referral to medical oncology and radiation oncology, and referral to genetics again for expanded  genetic evaluation.   She has already been contacted by Dr. Jana Hakim and  an appointment is made.        Past Medical History   Diagnosis  Date   .  Migraines     .  Asthma         as a child   .  Breast cancer  Sept 2012       right breast   .  PONV (postoperative nausea and vomiting)     .  Rash         neck         Past Surgical History  Procedure  Laterality  Date   .  Rhinoplasty       .  Adenoidectomy       .  Fibroid tumor surgery       .  Dilation and curettage of uterus       .  Uterine cervical treatments    2002       for precancerous lesions   .  Lazy eye           corrective surgery   .  Mastectomy w/ sentinel node biopsy    06/29/2011       Procedure: MASTECTOMY WITH SENTINEL LYMPH NODE BIOPSY;  Surgeon: Adin Hector, MD;  Location: [place];  Service: General;  Laterality: Right;  right skin spairing mstectomy and right sentinel lymph node biopsy   .  Breast reconstruction    06/29/2011       Procedure: BREAST RECONSTRUCTION;  Surgeon: Theodoro Kos, DO;  Location: [place];  Service: Plastics;  Laterality: Bilateral;  Immediate Bilateral Breast Reconstruction with Bilateral Tissue Expanders and placement of  Alloderm    .  Port-a-cath removal    03/12/2012       Procedure: MINOR REMOVAL PORT-A-CATH;  Surgeon: Adin Hector, MD;  Location: [place];  Service: General;  Laterality: N/A;  Upper left         Family History   Problem  Relation  Age of Onset   .  Adopted: Yes   .  Heart attack  Father     .  Cancer  Brother         prostate and lung   .  Cancer  Cousin         melanoma   .  Cancer  Sister         breast   .  Cancer  Sister         stomach, ovarian        Social History History   Substance Use Topics   .  Smoking status:  Former Smoker       Quit date:  07/20/1996   .  Smokeless tobacco:  Never Used   .  Alcohol Use:  No         Allergies   Allergen  Reactions   .  Tussionex Pennkinetic Er [Hydrocod Polst-Cpm Polst Er]  Other (See Comments)       halucinations   .  Codeine  Other (See Comments)       unkown   .  Morphine And Related  Anxiety       anxiety   .  Penicillins  Rash         Current Outpatient Prescriptions   Medication  Sig  Dispense  Refill   .  b complex vitamins tablet  Take 1 tablet by mouth daily.         .  Biotin (RA BIOTIN) 1000 MCG tablet  Take 1,000 mcg by mouth daily.         .  Calcium Carbonate-Vitamin D (CALCIUM-VITAMIN D) 500-200 MG-UNIT per tablet  Take 1 tablet by mouth 2 (two) times daily with a meal.         .  fish oil-omega-3 fatty acids 1000 MG capsule  Take 2 g by mouth daily.         .  fluconazole (DIFLUCAN) 150 MG tablet  TAKE BY MOUTH AS A ONE TIME DOSE   1 tablet  0   .  fluconazole (DIFLUCAN) 150 MG tablet  TAKE BY MOUTH AS A ONE TIME DOSE   1 tablet   2   .  ibuprofen (ADVIL,MOTRIN) 800 MG tablet  Take 800 mg by mouth every 8 (eight) hours as needed for pain.         [name]  loratadine (CLARITIN) 10 MG tablet  Take 10 mg by mouth daily.          [name]  LORazepam (ATIVAN) 0.5 MG tablet  Take one-half to one tablet by mouth at bedtime as needed for anxiety   30 tablet   0   .  Multiple Vitamins-Minerals (HAIR/SKIN/NAILS PO)  Take by mouth daily.         .  prenatal vitamin w/FE, FA (PRENATAL 1 + 1) 27-1 MG TABS  Take 1 tablet by mouth Daily.         .   tamoxifen (NOLVADEX) 20 MG tablet  TAKE ONE TABLET BY MOUTH EVERY DAY   90 tablet   3        Review of Systems Review of Systems  Constitutional: Negative for fever, chills and unexpected weight change.  HENT: Negative for congestion, hearing loss, sore throat, trouble swallowing and voice change.   Eyes: Negative for visual disturbance.  Respiratory: Negative for cough and wheezing.   Cardiovascular: Negative for chest pain, palpitations and leg swelling.  Gastrointestinal: Negative for nausea, vomiting, abdominal pain, diarrhea, constipation, blood in stool, abdominal distention and anal bleeding.  Genitourinary: Negative for hematuria, vaginal bleeding and difficulty urinating.  Musculoskeletal: Negative for arthralgias.  Skin: Negative for rash and wound.  Neurological: Negative for seizures, syncope and headaches.  Hematological: Negative for adenopathy. Does not bruise/bleed easily.  Psychiatric/Behavioral: Negative for confusion.      There were no vitals taken for this visit.   Physical Exam   Constitutional: She is oriented to person, place, and time. She appears well-developed and well-nourished. No distress.  HENT:   Head: Normocephalic and atraumatic.   Nose: Nose normal.   Mouth/Throat: No oropharyngeal exudate.  Eyes: Conjunctivae and EOM are normal. Pupils are equal, round, and reactive to light. Left eye exhibits no discharge. No scleral icterus.  Neck: Neck supple. No JVD present. No tracheal deviation present. No thyromegaly present.  Cardiovascular: Normal rate, regular rhythm, normal heart sounds and intact distal pulses.    No murmur heard. Pulmonary/Chest: Effort normal and breath sounds normal. No respiratory distress. She has no wheezes. She has no rales. She exhibits no tenderness.    Bilateral mastectomy, implant Reconstruction, nipple reconstruction with excellent cosmetic result. At the 3:00 position of the right breast there is a biopsy site, some  bruising, and a localized area of vague  thickening. No hematoma. There are no other nodules or ulcerations anywhere on the anterior chest wall. There is no axillary adenopathy.  Abdominal: Soft. Bowel sounds are normal. She exhibits no distension and no mass. There is no tenderness. There is no rebound and no guarding.  Musculoskeletal: She exhibits no edema and no tenderness.  Lymphadenopathy:    She has no cervical adenopathy.  Neurological: She is alert and oriented to person, place, and time. She exhibits normal muscle tone. Coordination normal.  Skin: Skin is warm. No rash noted. She is not diaphoretic. No erythema. No pallor.  Psychiatric: She has a normal mood and affect. Her behavior is normal. Judgment and thought content normal.      Data Reviewed I have reviewed  her CAT scan, ultrasound, image guided histology results. I have discussed her case at breast conference this morning. I have discussed her case with Dr. Danelle Earthly on the phone today.   Assessment    Local recurrence, right breast cancer, 3:00 position. This is a small subcentimeter recurrence interposed between the skin and the implant capsule. There is no evidence of cancer elsewhere.   History of locally advanced cancer right breast(diagnosis Sept., 2012), status post neoadjuvant chemotherapy, subsequent bilateral mastectomy(06/29/2011) and right sentinel node biopsy for invasive ductal carcinoma, receptor positive, HER-2 positive, BRCA negative. Pathologic stage ypT1c, ypN0 with negative margins.   Status post one year of Herceptin chemotherapy     Status post multiple plastic surgical procedures as above, resulting in excellent cosmetic result.      Plan    The patient, her husband and I had a very long discussion. One hour and 15 minutes.   My recommendation was wide local excision of the subcutaneous recurrence with a transverse elliptical incision, hopefully preserving the implant. She knows that  this will tighten the skin up some   Following that, decisions will need to be made regarding adjuvant radiation therapy and adjuvant chemotherapy. She has been followed by Dr. Jana Hakim and Dr. Isidore Moos in the past  ADDENDUM: Dr. Jana Hakim has requested placement of port. Discussed with patient in detail, including risks. She has had a port before. She agrees with this plan.   Dr. Nicki Reaper Tucker's Recommendation was for me to proceed with whatever was necessary to control her cancer. He will  stand by and once all of the treatments were completed, if there was any deformity he could revise the reconstruction as necessary.   She will be scheduled for excision of recurrent right breast cancer with needle localization and general anesthesia in the near future.   I discussed the indications, details, technique and numerous risk of the surgery with the patient and her husband at length. They are aware of the risk of bleeding, infection, positive margins necessitating further surgery, damage to the implant necessitating removal and/or replacement, and other unforseen problems. They understand all these issues. All of their questions were answered. They agree with this plan.          Edsel Petrin. Dalbert Batman, M.D., [facility] General and Minimally invasive Surgery Breast and Colorectal Surgery Office:   [phone] Pager:   [phone]

## 2013-05-05 ENCOUNTER — Encounter (HOSPITAL_COMMUNITY): Payer: BC Managed Care – PPO | Admitting: Anesthesiology

## 2013-05-05 ENCOUNTER — Ambulatory Visit (HOSPITAL_COMMUNITY): Payer: BC Managed Care – PPO | Admitting: Anesthesiology

## 2013-05-05 ENCOUNTER — Ambulatory Visit
Admit: 2013-05-05 | Discharge: 2013-05-05 | Disposition: A | Discharge status: Home / Self Care | Payer: BC Managed Care – PPO | Attending: General Surgery | Admitting: General Surgery

## 2013-05-05 ENCOUNTER — Encounter (HOSPITAL_COMMUNITY)
Admission: RE | Disposition: A | Discharge status: Home / Self Care | Payer: Self-pay | Source: Ambulatory Visit | Attending: General Surgery

## 2013-05-05 ENCOUNTER — Ambulatory Visit: Admit: 2013-05-05 | Payer: BC Managed Care – PPO

## 2013-05-05 ENCOUNTER — Encounter: Payer: Self-pay | Admitting: *Deleted

## 2013-05-05 ENCOUNTER — Ambulatory Visit (HOSPITAL_COMMUNITY): Payer: BC Managed Care – PPO

## 2013-05-05 ENCOUNTER — Ambulatory Visit
Admission: RE | Admit: 2013-05-05 | Discharge: 2013-05-05 | Disposition: A | Discharge status: Home / Self Care | Payer: BC Managed Care – PPO | Source: Ambulatory Visit | Attending: General Surgery | Admitting: General Surgery

## 2013-05-05 ENCOUNTER — Ambulatory Visit (HOSPITAL_COMMUNITY)
Admission: RE | Admit: 2013-05-05 | Discharge: 2013-05-05 | Disposition: A | Discharge status: Home / Self Care | Payer: BC Managed Care – PPO | Source: Ambulatory Visit | Attending: General Surgery | Admitting: General Surgery

## 2013-05-05 ENCOUNTER — Other Ambulatory Visit (INDEPENDENT_AMBULATORY_CARE_PROVIDER_SITE_OTHER): Payer: Self-pay

## 2013-05-05 ENCOUNTER — Encounter (HOSPITAL_COMMUNITY): Payer: Self-pay | Admitting: *Deleted

## 2013-05-05 ENCOUNTER — Inpatient Hospital Stay: Admission: RE | Admit: 2013-05-05 | Payer: BC Managed Care – PPO | Source: Ambulatory Visit

## 2013-05-05 DIAGNOSIS — K219 Gastro-esophageal reflux disease without esophagitis: Secondary | ICD-10-CM | POA: Insufficient documentation

## 2013-05-05 DIAGNOSIS — C50919 Malignant neoplasm of unspecified site of unspecified female breast: Secondary | ICD-10-CM

## 2013-05-05 DIAGNOSIS — C50911 Malignant neoplasm of unspecified site of right female breast: Secondary | ICD-10-CM

## 2013-05-05 DIAGNOSIS — N289 Disorder of kidney and ureter, unspecified: Secondary | ICD-10-CM | POA: Insufficient documentation

## 2013-05-05 DIAGNOSIS — Z87891 Personal history of nicotine dependence: Secondary | ICD-10-CM | POA: Insufficient documentation

## 2013-05-05 DIAGNOSIS — C50319 Malignant neoplasm of lower-inner quadrant of unspecified female breast: Secondary | ICD-10-CM | POA: Insufficient documentation

## 2013-05-05 DIAGNOSIS — F411 Generalized anxiety disorder: Secondary | ICD-10-CM | POA: Insufficient documentation

## 2013-05-05 HISTORY — DX: Gastro-esophageal reflux disease without esophagitis: K21.9

## 2013-05-05 HISTORY — PX: PORTACATH PLACEMENT: SHX2246

## 2013-05-05 HISTORY — DX: Dysplasia of cervix uteri, unspecified: N87.9

## 2013-05-05 HISTORY — DX: Calculus of kidney: N20.0

## 2013-05-05 HISTORY — PX: BREAST LUMPECTOMY WITH NEEDLE LOCALIZATION: SHX5759

## 2013-05-05 HISTORY — DX: Pneumonia, unspecified organism: J18.9

## 2013-05-05 HISTORY — DX: Anxiety disorder, unspecified: F41.9

## 2013-05-05 LAB — BASIC METABOLIC PANEL
BUN: 11 mg/dL (ref 6–23)
CO2: 22 mEq/L (ref 19–32)
Calcium: 9.1 mg/dL (ref 8.4–10.5)
Chloride: 103 mEq/L (ref 96–112)
Creatinine, Ser: 0.7 mg/dL (ref 0.50–1.10)
GFR calc Af Amer: >90 mL/min (ref >90)
GFR calc non Af Amer: >90 mL/min (ref >90)
Glucose, Bld: 80 mg/dL (ref 70–99)
Potassium: 5 mEq/L (ref 3.7–5.3)
Sodium: 142 mEq/L (ref 137–147)

## 2013-05-05 LAB — CBC
HCT: 38.8 % (ref 36.0–46.0)
Hemoglobin: 13.6 g/dL (ref 12.0–15.0)
MCH: 31.9 pg (ref 26.0–34.0)
MCHC: 35.1 g/dL (ref 30.0–36.0)
MCV: 90.9 fL (ref 78.0–100.0)
Platelets: 216 10*3/uL (ref 150–400)
RBC: 4.27 MIL/uL (ref 3.87–5.11)
RDW: 13.2 % (ref 11.5–15.5)
WBC: 6.1 10*3/uL (ref 4.0–10.5)

## 2013-05-05 SURGERY — BREAST LUMPECTOMY WITH NEEDLE LOCALIZATION
Anesthesia: General | Site: Chest | Laterality: Right

## 2013-05-05 MED ORDER — FENTANYL CITRATE 0.05 MG/ML IJ SOLN
INTRAMUSCULAR | Status: AC
  Filled 2013-05-05: qty 5

## 2013-05-05 MED ORDER — PROPOFOL 10 MG/ML IV BOLUS
INTRAVENOUS | Status: DC | PRN
  Administered 2013-05-05: 200 mg via INTRAVENOUS

## 2013-05-05 MED ORDER — MIDAZOLAM HCL 5 MG/5ML IJ SOLN
INTRAMUSCULAR | Status: DC | PRN
  Administered 2013-05-05: 2 mg via INTRAVENOUS

## 2013-05-05 MED ORDER — ONDANSETRON HCL 4 MG/2ML IJ SOLN
INTRAMUSCULAR | Status: DC | PRN
  Administered 2013-05-05: 4 mg via INTRAVENOUS

## 2013-05-05 MED ORDER — HEPARIN SODIUM (PORCINE) 5000 UNIT/ML IJ SOLN
INTRAMUSCULAR | Status: DC | PRN
  Administered 2013-05-05: 14:00:00

## 2013-05-05 MED ORDER — OXYCODONE-ACETAMINOPHEN 7.5-325 MG PO TABS: 1.0000 | ORAL_TABLET | ORAL | Status: DC | PRN

## 2013-05-05 MED ORDER — ONDANSETRON HCL 4 MG/2ML IJ SOLN
INTRAMUSCULAR | Status: AC
  Filled 2013-05-05: qty 2

## 2013-05-05 MED ORDER — HEPARIN SOD (PORK) LOCK FLUSH 100 UNIT/ML IV SOLN
INTRAVENOUS | Status: DC | PRN
  Administered 2013-05-05: 500 [IU] via INTRAVENOUS

## 2013-05-05 MED ORDER — BUPIVACAINE-EPINEPHRINE (PF) 0.25% -1:200000 IJ SOLN
INTRAMUSCULAR | Status: AC
  Filled 2013-05-05: qty 30

## 2013-05-05 MED ORDER — DEXAMETHASONE SODIUM PHOSPHATE 4 MG/ML IJ SOLN
INTRAMUSCULAR | Status: DC | PRN
  Administered 2013-05-05: 4 mg via INTRAVENOUS

## 2013-05-05 MED ORDER — SCOPOLAMINE 1 MG/3DAYS TD PT72
MEDICATED_PATCH | TRANSDERMAL | Status: DC | PRN
  Administered 2013-05-05: 1 via TRANSDERMAL

## 2013-05-05 MED ORDER — SUCCINYLCHOLINE CHLORIDE 20 MG/ML IJ SOLN
INTRAMUSCULAR | Status: DC | PRN
  Administered 2013-05-05: 60 mg via INTRAVENOUS

## 2013-05-05 MED ORDER — LACTATED RINGERS IV SOLN
INTRAVENOUS | Status: DC
  Administered 2013-05-05: 11:00:00 via INTRAVENOUS

## 2013-05-05 MED ORDER — SCOPOLAMINE 1 MG/3DAYS TD PT72
MEDICATED_PATCH | TRANSDERMAL | Status: AC
  Filled 2013-05-05: qty 1

## 2013-05-05 MED ORDER — ONDANSETRON HCL 4 MG/2ML IJ SOLN: 4.0000 mg | Freq: Once | INTRAMUSCULAR | Status: DC | PRN

## 2013-05-05 MED ORDER — LIDOCAINE HCL (CARDIAC) 20 MG/ML IV SOLN
INTRAVENOUS | Status: DC | PRN
  Administered 2013-05-05: 100 mg via INTRAVENOUS

## 2013-05-05 MED ORDER — BUPIVACAINE-EPINEPHRINE PF 0.25-1:200000 % IJ SOLN
INTRAMUSCULAR | Status: DC | PRN
  Administered 2013-05-05: 8 mL

## 2013-05-05 MED ORDER — HEPARIN SOD (PORK) LOCK FLUSH 100 UNIT/ML IV SOLN
INTRAVENOUS | Status: AC
  Filled 2013-05-05: qty 5

## 2013-05-05 MED ORDER — HYDROMORPHONE HCL PF 1 MG/ML IJ SOLN
INTRAMUSCULAR | Status: AC
  Filled 2013-05-05: qty 1

## 2013-05-05 MED ORDER — ROCURONIUM BROMIDE 50 MG/5ML IV SOLN
INTRAVENOUS | Status: AC
  Filled 2013-05-05: qty 1

## 2013-05-05 MED ORDER — HYDROMORPHONE HCL PF 1 MG/ML IJ SOLN
0.2500 mg | INTRAMUSCULAR | Status: DC | PRN
  Administered 2013-05-05: 0.5 mg via INTRAVENOUS

## 2013-05-05 MED ORDER — EPHEDRINE SULFATE 50 MG/ML IJ SOLN
INTRAMUSCULAR | Status: DC | PRN
  Administered 2013-05-05: 10 mg via INTRAVENOUS
  Administered 2013-05-05: 20 mg via INTRAVENOUS
  Administered 2013-05-05: 10 mg via INTRAVENOUS

## 2013-05-05 MED ORDER — PROPOFOL 10 MG/ML IV BOLUS
INTRAVENOUS | Status: AC
  Filled 2013-05-05: qty 20

## 2013-05-05 MED ORDER — LIDOCAINE HCL (CARDIAC) 20 MG/ML IV SOLN
INTRAVENOUS | Status: AC
  Filled 2013-05-05: qty 5

## 2013-05-05 MED ORDER — MIDAZOLAM HCL 2 MG/2ML IJ SOLN
INTRAMUSCULAR | Status: AC
  Filled 2013-05-05: qty 2

## 2013-05-05 MED ORDER — DEXAMETHASONE SODIUM PHOSPHATE 4 MG/ML IJ SOLN
INTRAMUSCULAR | Status: AC
  Filled 2013-05-05: qty 1

## 2013-05-05 MED ORDER — FENTANYL CITRATE 0.05 MG/ML IJ SOLN
INTRAMUSCULAR | Status: DC | PRN
  Administered 2013-05-05: 100 ug via INTRAVENOUS

## 2013-05-05 SURGICAL SUPPLY — 83 items
ADH SKN CLS APL DERMABOND .7 (GAUZE/BANDAGES/DRESSINGS) ×2
ADH SKN CLS LQ APL DERMABOND (GAUZE/BANDAGES/DRESSINGS) ×4
APPLIER CLIP 9.375 MED OPEN (MISCELLANEOUS) ×3
APR CLP MED 9.3 20 MLT OPN (MISCELLANEOUS) ×2
BAG DECANTER FOR FLEXI CONT (MISCELLANEOUS) ×3 IMPLANT
BINDER BREAST LRG (GAUZE/BANDAGES/DRESSINGS) ×1 IMPLANT
BINDER BREAST XLRG (GAUZE/BANDAGES/DRESSINGS) IMPLANT
BLADE SURG 10 STRL SS (BLADE) ×4 IMPLANT
BLADE SURG 11 STRL SS (BLADE) ×1 IMPLANT
BLADE SURG 15 STRL LF DISP TIS (BLADE) ×2 IMPLANT
BLADE SURG 15 STRL SS (BLADE) ×3
BLADE SURG ROTATE 9660 (MISCELLANEOUS) IMPLANT
CANISTER SUCTION 2500CC (MISCELLANEOUS) ×3 IMPLANT
CHLORAPREP W/TINT 10.5 ML (MISCELLANEOUS) ×3 IMPLANT
CHLORAPREP W/TINT 26ML (MISCELLANEOUS) ×3 IMPLANT
CLIP APPLIE 9.375 MED OPEN (MISCELLANEOUS) IMPLANT
COVER PROBE W GEL 5X96 (DRAPES) IMPLANT
COVER SURGICAL LIGHT HANDLE (MISCELLANEOUS) ×5 IMPLANT
CRADLE DONUT ADULT HEAD (MISCELLANEOUS) ×3 IMPLANT
DECANTER SPIKE VIAL GLASS SM (MISCELLANEOUS) ×2 IMPLANT
DERMABOND ADHESIVE PROPEN (GAUZE/BANDAGES/DRESSINGS) ×2
DERMABOND ADVANCED (GAUZE/BANDAGES/DRESSINGS) ×1
DERMABOND ADVANCED .7 DNX12 (GAUZE/BANDAGES/DRESSINGS) ×2 IMPLANT
DERMABOND ADVANCED .7 DNX6 (GAUZE/BANDAGES/DRESSINGS) IMPLANT
DEVICE DUBIN SPECIMEN MAMMOGRA (MISCELLANEOUS) ×3 IMPLANT
DRAPE C-ARM 42X72 X-RAY (DRAPES) ×3 IMPLANT
DRAPE LAPAROSCOPIC ABDOMINAL (DRAPES) ×4 IMPLANT
DRAPE UTILITY 15X26 W/TAPE STR (DRAPE) ×8 IMPLANT
ELECT CAUTERY BLADE 6.4 (BLADE) ×4 IMPLANT
ELECT REM PT RETURN 9FT ADLT (ELECTROSURGICAL) ×3
ELECTRODE REM PT RTRN 9FT ADLT (ELECTROSURGICAL) ×2 IMPLANT
GAUZE SPONGE 4X4 16PLY XRAY LF (GAUZE/BANDAGES/DRESSINGS) ×4 IMPLANT
GLOVE BIOGEL PI IND STRL 6.5 (GLOVE) IMPLANT
GLOVE BIOGEL PI IND STRL 7.0 (GLOVE) IMPLANT
GLOVE BIOGEL PI IND STRL 7.5 (GLOVE) IMPLANT
GLOVE BIOGEL PI INDICATOR 6.5 (GLOVE) ×1
GLOVE BIOGEL PI INDICATOR 7.0 (GLOVE) ×1
GLOVE BIOGEL PI INDICATOR 7.5 (GLOVE) ×1
GLOVE EUDERMIC 7 POWDERFREE (GLOVE) ×4 IMPLANT
GLOVE SURG SS PI 7.0 STRL IVOR (GLOVE) ×2 IMPLANT
GOWN STRL NON-REIN LRG LVL3 (GOWN DISPOSABLE) ×6 IMPLANT
GOWN STRL REIN XL XLG (GOWN DISPOSABLE) ×4 IMPLANT
INTRODUCER 13FR (MISCELLANEOUS) IMPLANT
INTRODUCER COOK 11FR (CATHETERS) IMPLANT
KIT BASIN OR (CUSTOM PROCEDURE TRAY) ×3 IMPLANT
KIT MARKER MARGIN INK (KITS) IMPLANT
KIT PORT POWER 8FR ISP CVUE (Catheter) ×1 IMPLANT
KIT PORT POWER 9.6FR MRI PREA (Catheter) IMPLANT
KIT PORT POWER ISP 8FR (Catheter) IMPLANT
KIT POWER CATH 8FR (Catheter) IMPLANT
KIT ROOM TURNOVER OR (KITS) ×3 IMPLANT
MARKER SKIN DUAL TIP RULER LAB (MISCELLANEOUS) ×1 IMPLANT
NDL HYPO 25GX1X1/2 BEV (NEEDLE) ×2 IMPLANT
NEEDLE HYPO 25GX1X1/2 BEV (NEEDLE) ×9 IMPLANT
NS IRRIG 1000ML POUR BTL (IV SOLUTION) ×3 IMPLANT
PACK GENERAL/GYN (CUSTOM PROCEDURE TRAY) ×3 IMPLANT
PACK SURGICAL SETUP 50X90 (CUSTOM PROCEDURE TRAY) ×3 IMPLANT
PAD ARMBOARD 7.5X6 YLW CONV (MISCELLANEOUS) ×6 IMPLANT
PENCIL BUTTON HOLSTER BLD 10FT (ELECTRODE) ×3 IMPLANT
SET INTRODUCER 12FR PACEMAKER (SHEATH) IMPLANT
SET SHEATH INTRODUCER 10FR (MISCELLANEOUS) IMPLANT
SHEATH COOK PEEL AWAY SET 9F (SHEATH) IMPLANT
SLEEVE SURGEON STRL (DRAPES) ×1 IMPLANT
SPONGE LAP 4X18 X RAY DECT (DISPOSABLE) ×1 IMPLANT
STAPLER VISISTAT 35W (STAPLE) ×3 IMPLANT
SURGILUBE 3G PEEL PACK STRL (MISCELLANEOUS) IMPLANT
SUT MNCRL AB 4-0 PS2 18 (SUTURE) ×4 IMPLANT
SUT PROLENE 2 0 CT2 30 (SUTURE) ×4 IMPLANT
SUT SILK 2 0 SH (SUTURE) IMPLANT
SUT VIC AB 3-0 SH 18 (SUTURE) ×3 IMPLANT
SUT VIC AB 3-0 SH 27 (SUTURE) ×3
SUT VIC AB 3-0 SH 27X BRD (SUTURE) IMPLANT
SUT VICRYL AB 3 0 TIES (SUTURE) IMPLANT
SYR 20ML ECCENTRIC (SYRINGE) ×6 IMPLANT
SYR 5ML LUER SLIP (SYRINGE) ×3 IMPLANT
SYR BULB 3OZ (MISCELLANEOUS) ×3 IMPLANT
SYR CONTROL 10ML LL (SYRINGE) ×5 IMPLANT
SYRINGE 10CC LL (SYRINGE) ×3 IMPLANT
TOWEL OR 17X24 6PK STRL BLUE (TOWEL DISPOSABLE) ×3 IMPLANT
TOWEL OR 17X26 10 PK STRL BLUE (TOWEL DISPOSABLE) ×3 IMPLANT
TUBE CONNECTING 12X1/4 (SUCTIONS) IMPLANT
WATER STERILE IRR 1000ML POUR (IV SOLUTION) IMPLANT
YANKAUER SUCT BULB TIP NO VENT (SUCTIONS) ×2 IMPLANT

## 2013-05-05 NOTE — Transfer of Care (Signed)
Immediate Anesthesia Transfer of Care Note  Patient: [name]  Procedure(s) Performed: Procedure(s): EXCISION RECURRENT CANCER RIGHT BREAST WITH NEEDLE LOCALOZATION (Right) INSERTION PORT-A-CATH (Left)  Patient Location: PACU  Anesthesia Type:General  Level of Consciousness: awake, alert  and oriented  Airway & Oxygen Therapy: Patient Spontanous Breathing and Patient connected to nasal cannula oxygen  Post-op Assessment: Report given to PACU RN and Post -op Vital signs reviewed and stable  Post vital signs: Reviewed and stable  Complications: No apparent anesthesia complications

## 2013-05-05 NOTE — Interval H&P Note (Signed)
History and Physical Interval Note:  05/05/2013 12:13 PM  [name]  has presented today for surgery, with the diagnosis of RECURRENT RIGHT BREAST CANCER  The goals and the various methods of treatment have been discussed with the patient and family. After consideration of risks, benefits and other options for treatment, the patient has consented to  Procedure(s): EXCISION RECURRENT CANCER RIGHT BREAST WITH NEEDLE LOCALOZATION (Right) INSERTION PORT-A-CATH (N/A) as a surgical intervention .  The patient's history has been reviewed, patient examined today, no change in status, stable for surgery.  I have reviewed the patient's chart and labs.  Questions were answered to the patient's satisfaction.     Adin Hector

## 2013-05-05 NOTE — Op Note (Signed)
Patient Name:           [name]   Date of Surgery:        05/05/2013  Pre op Diagnosis:      Recurrent breast cancer, right breast, lower inner quadrant  Post op Diagnosis:    Same  Procedure:                 Excision recurrent breast cancer right breast, lower inner quadrant with needle localization and specimen mammogram Insertion of Port-A-Cath Using fluoroscopy for guidance and positioning  Surgeon:                     Edsel Petrin. Dalbert Batman, M.D., FACS  Assistant:                      none  Operative Indications:   Melodie Ashworth is a 43 y.o. Female who was referred back to me by Dr. Lillia Mountain for evaluation and management of a local recurrence of her right breast cancer.   She initially presented with locally advanced cancer of the right breast,lower inner quadrant in Sept., 2012.. She underwent neoadjuvant chemotherapy. On 06/29/2011 she underwent right total mastectomy and sentinel node biopsy and left total mastectomy and tissue expanders. The right breast revealed invasive ductal carcinoma, ER-positive, HER-2 positive. Pathologic stage ypT1c, yp N0. Right breast margins negative, 0.8 cm posteriorly.     Left breast benign.    BRCA testing was negative. She received a year of Herceptin chemotherapy.       On 11/23/2011 Dr. Migdalia Dk removed her expanders and replaced with implants.She has subsequently had those implants removed, new implants placed, fat injections performed, and nipple reconstruction performed in August and October of last year by Dr. [name] at [facility] plastic surgery in [place].     She has been followed closely by Dr. Jana Hakim. PET-CT scan was performed on 04/07/2013. This shows mildly hypermetabolic subcutaneous nodule medially in the right breast said to be nonspecific. Subsequent ultrasound showed a 7 mm nodule in the right breast at the 4:00 position, 4 cm from the reconstructed nipple. The right axilla did not show any adenopathy. Ultrasound-guided  biopsy was performed and it shows invasive ductal carcinoma. Breast diagnostic profile pending. We discussed her case in breast conference recently.  It is felt she has a local recurrence but no signs of any metastatic disease. Consensus recommendations were local excision of the nodule, plastic surgical consultation regarding management of her reconstruction, referral to medical oncology and radiation oncology, and referral to genetics again for expanded genetic evaluation.     All of this has been done. Her tumor is HER-2 positive. Dr. Jana Hakim has requested a Port-A-Cath for chemotherapy, and the patient agrees to this.  Operative Findings:       The small recurrent cancer was excised from the lower inner quadrant of the right breast with a radially oriented elliptical incision. I took the dissection all the way down into the pectoralis muscle which was very thin. Specimen mammogram looked good with the marker clip and the exact center of the specimen. I felt that we had gotten widely around this cancer.  There was no apparent injury to the implant. We were able to place a Port-A-Cath in the left subclavian vein, which is where it had been before, and the fluoroscopic images looked good.  Procedure in Detail:          The patient underwent wire localization  at the breast center [facility] by Dr. Prince Solian. The wire entered from the inferomedial aspect was directed through the tumor. Dr. Sadie Haber also placed a skin marker over the tumor. She was brought to the operating room. Gen. Anesthesia with LMA device was induced. Intravenous antibiotics were given. Surgical time out was performed. The right breast was then prepped and draped in a sterile fashion. I very carefully injected 0.5% Marcaine with epinephrine very superficially. Using a marking pen I planned the incision as a radially oriented elliptical incision,  excising the skin overlying the tumor. This incision was probably about 7 cm long by about 3 cm wide.  The skin incision was made with the  Knife and the rest of the dissection was done with cautery. Dissection down through the breast tissue to the very thinned out pectoralis major muscle. I excised a little bit of muscle off of the implant capsule. The specimen was removed and marked with silk sutures for the superior, lateral, and deep margins. The specimen mammogram looked very good with the tumor and marker clip  in the center of the specimen. This was sent to pathology. The wound was irrigated with saline. Hemostasis was excellent and achieved with electrocautery. Subcutaneous tissue was closed with interrupted sutures of 3-0 Vicryl and the skin closed with a running subcuticular suture of 4-0 Monocryl and Dermabond.  We then took the drapes off and  repositioned the patient with a small roll behind her shoulders her arms at her sides. We prepped and draped the neck and chest extensively. Another surgical time out was performed. 0.5% Marcaine with epinephrine was used as local infiltration anesthetic. A left subclavian venipuncture was performed I was able to pass a guidewire into the superior vena cava under fluoroscopic guidance. A small incision was made at the wire insertion site. Using fluoroscopy I marked a template on the chest wall where I thought  the catheter should be  to get the tip of the catheter in the superior vena cava just at the right atrial junction. I then made a transverse incision in the left infraclavicular area, through the previous scar where the previous port had been. The subcutaneous pocket was created. Using a tunneling device I passed  the catheter from the port pocket site to the wire insertion site. Then using a marked template on the chest wall I measured the catheter length at 22-1/2 cm. I cut the catheter at that length and then secured the catheter to the port with the locking device. I flushed the port and catheter with saline- heparin. I placed the port loosely in the  pocket. The dilator and peel-away sheath were inserted over the guidewire into the central venous circulation, the dilator and guidewire were removed, the catheter was threaded through the peel-away sheath and then   the peel-away sheath was removed. I did flush the catheter easily and had excellent blood return. Fluoroscopy confirmed that the catheter was in proper position in the superior vena cava just above the right atrial junction. There was no kinking or deformity of  the catheter anywhere. I flushed the port and catheter with concentrated  heparin. I sutured the port to the pectoralis fascia with 3 interrupted sutures of 2-0 Prolene. Subcutaneous tissue was closed with 3-0 Vicryl and skin closed with running subcuticular suture of 4-0 Monocryl and Dermabond. The patient tolerated the procedure well was taken to PACU in stable condition. A chest x-ray will be obtained PACU. EBL 15 cc. Counts correct. Complications none.  Edsel Petrin. Dalbert Batman, M.D., FACS General and Minimally Invasive Surgery Breast and Colorectal Surgery  05/05/2013 2:01 PM

## 2013-05-05 NOTE — Anesthesia Procedure Notes (Signed)
Procedure Name: LMA Insertion Date/Time: 05/05/2013 12:30 PM Performed by: Mariea Clonts Pre-anesthesia Checklist: Patient identified, Patient being monitored, Timeout performed, Emergency Drugs available and Suction available Patient Re-evaluated:Patient Re-evaluated prior to inductionOxygen Delivery Method: Circle system utilized Preoxygenation: Pre-oxygenation with 100% oxygen Intubation Type: IV induction LMA Size: 3.0 Number of attempts: 1 Placement Confirmation: breath sounds checked- equal and bilateral,  positive ETCO2 and CO2 detector Tube secured with: Tape Dental Injury: Teeth and Oropharynx as per pre-operative assessment

## 2013-05-05 NOTE — Discharge Instructions (Signed)
[place] Office Phone Number [phone]  BREAST BIOPSY/ PARTIAL MASTECTOMY: POST OP INSTRUCTIONS  Always review your discharge instruction sheet given to you by the facility where your surgery was performed.  IF YOU HAVE DISABILITY OR FAMILY LEAVE FORMS, YOU MUST BRING THEM TO THE OFFICE FOR PROCESSING.  DO NOT GIVE THEM TO YOUR DOCTOR.  1. A prescription for pain medication may be given to you upon discharge.  Take your pain medication as prescribed, if needed.  If narcotic pain medicine is not needed, then you may take acetaminophen (Tylenol) or ibuprofen (Advil) as needed. 2. Take your usually prescribed medications unless otherwise directed 3. If you need a refill on your pain medication, please contact your pharmacy.  They will contact our office to request authorization.  Prescriptions will not be filled after 5pm or on week-ends. 4. You should eat very light the first 24 hours after surgery, such as soup, crackers, pudding, etc.  Resume your normal diet the day after surgery. 5. Most patients will experience some swelling and bruising in the breast.  Ice packs and a good support bra will help.  Swelling and bruising can take several days to resolve.  6. It is common to experience some constipation if taking pain medication after surgery.  Increasing fluid intake and taking a stool softener will usually help or prevent this problem from occurring.  A mild laxative (Milk of Magnesia or Miralax) should be taken according to package directions if there are no bowel movements after 48 hours. 7. Unless discharge instructions indicate otherwise, you may remove your bandages 24-48 hours after surgery, and you may shower at that time.  You may have steri-strips (small skin tapes) in place directly over the incision.  These strips should be left on the skin for 7-10 days.  If your surgeon used skin glue on the incision, you may shower in 24 hours.  The glue will flake off over the  next 2-3 weeks.  Any sutures or staples will be removed at the office during your follow-up visit. 8. ACTIVITIES:  You may resume regular daily activities (gradually increasing) beginning the next day.  Wearing a good support bra or sports bra minimizes pain and swelling.  You may have sexual intercourse when it is comfortable. a. You may drive when you no longer are taking prescription pain medication, you can comfortably wear a seatbelt, and you can safely maneuver your car and apply brakes. b. RETURN TO WORK:  ______________________________________________________________________________________ 9. You should see your doctor in the office for a follow-up appointment approximately two weeks after your surgery.  Your doctors nurse will typically make your follow-up appointment when she calls you with your pathology report.  Expect your pathology report 2-3 business days after your surgery.  You may call to check if you do not hear from [place] after three days. 10. OTHER INSTRUCTIONS: _______________________________________________________________________________________________ _____________________________________________________________________________________________________________________________________ _____________________________________________________________________________________________________________________________________ _____________________________________________________________________________________________________________________________________  WHEN TO CALL YOUR DOCTOR: 1. Fever over 101.0 2. Nausea and/or vomiting. 3. Extreme swelling or bruising. 4. Continued bleeding from incision. 5. Increased pain, redness, or drainage from the incision.  The clinic staff is available to answer your questions during regular business hours.  Please dont hesitate to call and ask to speak to one of the nurses for clinical concerns.  If you have a medical emergency, go to the nearest  emergency room or call 911.  A surgeon from [facility] is always on call at the hospital.  For further questions, please visit [email or web address]  PORT-A-CATH: POST OP INSTRUCTIONS  Always review your discharge instruction sheet given to you by the facility where your surgery was performed.   1. A prescription for pain medication may be given to you upon discharge. Take your pain medication as prescribed, if needed. If narcotic pain medicine is not needed, then you make take acetaminophen (Tylenol) or ibuprofen (Advil) as needed.  2. Take your usually prescribed medications unless otherwise directed. 3. If you need a refill on your pain medication, please contact our office. All narcotic pain medicine now requires a paper prescription.  Phoned in and fax refills are no longer allowed by law.  Prescriptions will not be filled after 5 pm or on weekends.  4. You should follow a light diet for the remainder of the day after your procedure. 5. Most patients will experience some mild swelling and/or bruising in the area of the incision. It may take several days to resolve. 6. It is common to experience some constipation if taking pain medication after surgery. Increasing fluid intake and taking a stool softener (such as Colace) will usually help or prevent this problem from occurring. A mild laxative (Milk of Magnesia or Miralax) should be taken according to package directions if there are no bowel movements after 48 hours.  7. Unless discharge instructions indicate otherwise, you may remove your bandages 48 hours after surgery, and you may shower at that time. You may have steri-strips (small white skin tapes) in place directly over the incision.  These strips should be left on the skin for 7-10 days.  If your surgeon used Dermabond (skin glue) on the incision, you may shower in 24 hours.  The glue will flake off over the next 2-3 weeks.  8. If your port is left  accessed at the end of surgery (needle left in port), the dressing cannot get wet and should only by changed by a healthcare professional. When the port is no longer accessed (when the needle has been removed), follow step 7.   9. ACTIVITIES:  Limit activity involving your arms for the next 72 hours. Do no strenuous exercise or activity for 1 week. You may drive when you are no longer taking prescription pain medication, you can comfortably wear a seatbelt, and you can maneuver your car. 10.You may need to see your doctor in the office for a follow-up appointment.  Please       check with your doctor.  11.When you receive a new Port-a-Cath, you will get a product guide and        ID card.  Please keep them in case you need them.  WHEN TO CALL YOUR DOCTOR [phone]): 1. Fever over 101.0 2. Chills 3. Continued bleeding from incision 4. Increased redness and tenderness at the site 5. Shortness of breath, difficulty breathing   The clinic staff is available to answer your questions during regular business hours. Please dont hesitate to call and ask to speak to one of the nurses or medical assistants for clinical concerns. If you have a medical emergency, go to the nearest emergency room or call 911.  A surgeon from [facility] is always on call at the hospital.     For further information, please visit [email or web address]       What to eat:  For your first meals, you should eat lightly; only small meals initially.  If you do not have nausea, you may eat larger meals.  Avoid spicy, greasy and heavy food.    General  Anesthesia, Adult, Care After  Refer to this sheet in the next few weeks. These instructions provide you with information on caring for yourself after your procedure. Your health care provider may also give you more specific instructions. Your treatment has been planned according to current medical practices, but problems sometimes occur. Call your  health care provider if you have any problems or questions after your procedure.  WHAT TO EXPECT AFTER THE PROCEDURE  After the procedure, it is typical to experience:  Sleepiness.  Nausea and vomiting. HOME CARE INSTRUCTIONS  For the first 24 hours after general anesthesia:  Have a responsible person with you.  Do not drive a car. If you are alone, do not take public transportation.  Do not drink alcohol.  Do not take medicine that has not been prescribed by your health care provider.  Do not sign important papers or make important decisions.  You may resume a normal diet and activities as directed by your health care provider.  Change bandages (dressings) as directed.  If you have questions or problems that seem related to general anesthesia, call the hospital and ask for the anesthetist or anesthesiologist on call. SEEK MEDICAL CARE IF:  You have nausea and vomiting that continue the day after anesthesia.  You develop a rash. SEEK IMMEDIATE MEDICAL CARE IF:  You have difficulty breathing.  You have chest pain.  You have any allergic problems. Document Released: 07/03/2000 Document Revised: 11/27/2012 Document Reviewed: 10/10/2012  [facility] Patient Information 2014 [place], [place].

## 2013-05-05 NOTE — Progress Notes (Signed)
Received referral in epic and the pt just had surgery today.  I emailed [name] at [facility] to make her aware that I would contact the pt on 1/29 or 1/30 to schedule the genetic appt.

## 2013-05-05 NOTE — Anesthesia Postprocedure Evaluation (Signed)
  Anesthesia Post-op Note  Patient: [name]  Procedure(s) Performed: Procedure(s): EXCISION RECURRENT CANCER RIGHT BREAST WITH NEEDLE LOCALOZATION (Right) INSERTION PORT-A-CATH (Left)  Patient Location: PACU  Anesthesia Type:General  Level of Consciousness: awake, alert  and oriented  Airway and Oxygen Therapy: Patient Spontanous Breathing  Post-op Pain: mild  Post-op Assessment: Post-op Vital signs reviewed, Patient's Cardiovascular Status Stable, Respiratory Function Stable, Patent Airway, No signs of Nausea or vomiting and Pain level controlled  Post-op Vital Signs: Reviewed and stable  Complications: No apparent anesthesia complications

## 2013-05-05 NOTE — Anesthesia Preprocedure Evaluation (Signed)
Anesthesia Evaluation  Patient identified by MRN, date of birth, ID band Patient awake    Reviewed: Allergy & Precautions, H&P , NPO status , Patient's Chart, lab work & pertinent test results  History of Anesthesia Complications (+) PONV  Airway       Dental   Pulmonary asthma , former smoker,          Cardiovascular     Neuro/Psych  Headaches, Anxiety    GI/Hepatic GERD-  ,  Endo/Other    Renal/GU Renal disease     Musculoskeletal   Abdominal   Peds  Hematology   Anesthesia Other Findings   Reproductive/Obstetrics                           Anesthesia Physical Anesthesia Plan  ASA: I  Anesthesia Plan: General   Post-op Pain Management:    Induction: Intravenous  Airway Management Planned: Oral ETT and LMA  Additional Equipment:   Intra-op Plan:   Post-operative Plan: Extubation in OR  Informed Consent: I have reviewed the patients History and Physical, chart, labs and discussed the procedure including the risks, benefits and alternatives for the proposed anesthesia with the patient or authorized representative who has indicated his/her understanding and acceptance.     Plan Discussed with:   Anesthesia Plan Comments:         Anesthesia Quick Evaluation

## 2013-05-05 NOTE — Preoperative (Signed)
Beta Blockers   Reason not to administer Beta Blockers:Not Applicable 

## 2013-05-06 ENCOUNTER — Encounter (HOSPITAL_COMMUNITY): Payer: Self-pay | Admitting: General Surgery

## 2013-05-07 ENCOUNTER — Telehealth (INDEPENDENT_AMBULATORY_CARE_PROVIDER_SITE_OTHER): Payer: Self-pay

## 2013-05-07 NOTE — Telephone Encounter (Signed)
Message copied by Gweneth Fritter on Wed May 07, 2013  5:54 PM ------      Message from: Adin Hector      Created: Wed May 07, 2013  5:11 PM       Inform patient of Pathology report,.Tell her the margins are negative and she will not need any more surgery. I will discuss this in more detail at her next office visit. ------

## 2013-05-07 NOTE — Telephone Encounter (Signed)
Pt called for path report. Advised I didn't see that it was ready. I answered her questions regarding restrictions. She would like Clarise Cruz to call her back today with an appointment.

## 2013-05-07 NOTE — Progress Notes (Signed)
Quick Note:  Inform patient of Pathology report,.Tell her the margins are negative and she will not need any more surgery. I will discuss this in more detail at her next office visit. ______

## 2013-05-07 NOTE — Telephone Encounter (Signed)
Pt notified of results.  Scheduled for f/u for 2/20 at 3:45.  She wondered if this is too far out.  I advised it is not but she can call with any concerns prior.  She sees Dr Jana Hakim this Friday.  I advised she can shower now and not lift over 15 lbs.

## 2013-05-08 ENCOUNTER — Telehealth: Payer: Self-pay | Admitting: *Deleted

## 2013-05-08 NOTE — Telephone Encounter (Signed)
OK to see me 2/20 if no wound problems, especially since Dr. Jana Hakim will check Friday.  hmi

## 2013-05-08 NOTE — Telephone Encounter (Signed)
Called pt to schedule her for a genetic appt and she informed me that she had already had genetics completed back in 2012.  I looked in EPIC and did not see the results under [facility] so I looked in [place] and found the results.  I informed her that I have them and she asked did we have new studies now compared to back then and I told her that I am not sure, but she could discuss that with Dr. Jana Hakim during her visit tomorrow and she was fine with that.  I took a copy of the results to Dr. Jana Hakim and informed him of the phone conversation and her question.  I also took a copy to [facility] to scan into EPIC.  I emailed Clarise Cruz at [facility] to make her aware of everything.  Cancelled referral in EPIC.

## 2013-05-09 ENCOUNTER — Other Ambulatory Visit: Payer: Self-pay | Admitting: *Deleted

## 2013-05-09 ENCOUNTER — Ambulatory Visit: Payer: BC Managed Care – PPO | Admitting: Oncology

## 2013-05-09 ENCOUNTER — Ambulatory Visit (HOSPITAL_BASED_OUTPATIENT_CLINIC_OR_DEPARTMENT_OTHER): Payer: BC Managed Care – PPO | Admitting: Oncology

## 2013-05-09 ENCOUNTER — Telehealth: Payer: Self-pay | Admitting: *Deleted

## 2013-05-09 VITALS — BP 110/76 | HR 76 | Temp 98.4°F | Resp 18 | Wt 123.2 lb

## 2013-05-09 DIAGNOSIS — Z170 Estrogen receptor positive status [ER+]: Secondary | ICD-10-CM

## 2013-05-09 DIAGNOSIS — C50311 Malignant neoplasm of lower-inner quadrant of right female breast: Secondary | ICD-10-CM

## 2013-05-09 DIAGNOSIS — C50319 Malignant neoplasm of lower-inner quadrant of unspecified female breast: Secondary | ICD-10-CM

## 2013-05-09 DIAGNOSIS — Z9013 Acquired absence of bilateral breasts and nipples: Secondary | ICD-10-CM

## 2013-05-09 DIAGNOSIS — C50919 Malignant neoplasm of unspecified site of unspecified female breast: Secondary | ICD-10-CM

## 2013-05-09 MED ORDER — LIDOCAINE-PRILOCAINE 2.5-2.5 % EX CREA: TOPICAL_CREAM | CUTANEOUS | Status: DC

## 2013-05-09 MED ORDER — PROCHLORPERAZINE MALEATE 5 MG PO TABS: 5.0000 mg | ORAL_TABLET | Freq: Four times a day (QID) | ORAL | Status: DC | PRN

## 2013-05-09 NOTE — Telephone Encounter (Signed)
appts made and printed. Pt is aware that tx will be added. i emailed MW to add the tx. Made the pt aware that GCM needs to respond to my email due to per pof pt is to be seen by AGB on 05/23/13@ 9:15am, however she on pal that day. Pt is aware that i will call her with an appt d/t once GCM get back with me....td

## 2013-05-09 NOTE — Telephone Encounter (Signed)
Per staff message and POF I have scheduled appts.  JMW  

## 2013-05-09 NOTE — Patient Instructions (Signed)
Patient given printouts from [place] from [facility] resources on  Anastrozole and Kadcyla(TDM-1).

## 2013-05-09 NOTE — Progress Notes (Signed)
ID: [name]   DOB: 09/15/1970  MR#: [id number]  CSN#:[id number]   PCP: Chauncey Cruel, MD GYN: Baltazar Najjar MD SU: Fanny Skates MD OTHER MD: Theodoro Kos, [name]  HISTORY OF PRESENT ILLNESS: [name] (goes by "[name]") is a [place] woman who, at the age of 43, was referred by Dr. Baltazar Najjar for treatment of right breast carcinoma.  The patient palpated a mass in her right breast which she brought to Dr. Jacelyn Grip attention. A prior screening mammogram on 02/06/2010 had been unremarkable. The patient was then scheduled for bilateral diagnostic mammogram and right breast ultrasonography on 12/22/2010, the studies demonstrating an ill defined mass with heterogeneous calcifications in the inner lower right breast, measuring 1.7 cm. The breasts are heterogeneously dense, but there were no other mammographic abnormalities in either breast. Clinically, there was a palpable firm mass at the 5:00 position of the right breast, 5 cm from the nipple. There were no palpable abnormalities in the right axilla. Ultrasound confirmed a 1.7 cm irregular, hypoechoic mass in the same position. There were some lymph nodes noted in the right exam of, one measuring 1.6 cm with a minimally thickened cortex inferiorly.  An ultrasound-guided core biopsy on 12/23/2010 [phone]) showed an invasive ductal carcinoma, grade 2, ER positive at 88%, PR positive at 11 is %, HER-2/neu positive with a FISH ratio of 4.61, and an MIB-1 of 36%.  The patient was referred to Dr. Fanny Skates and bilateral breast MRIs were obtained 12/30/2010. This showed the mass in question in the right breast to measure 2.0 cm. In the right axilla there was a 1.8 cm lymph node with a thickened cortex. The left breast was unremarkable. Her subsequent treatment is as detailed below   INTERVAL HISTORY: [name] returns today for followup of her breast cancer accompanied by her husband [name].. The interval history  is significant for her having had a restaging PET scan late December 2014 which was positive only for a small subcutaneous nodule in the right breast lower outer quadrant. There was no other finding of concern. Ultrasound of both breasts 04/08/2013 showed a nodule in the right breast at the 4:00 position measuring 0.7 cm. The right axilla was clear. There was an area at the 7:00 position of the left breast which was of concern by palpation but showed only normal tissue.  Biopsy of the right breast mass 04/09/2013 showed an invasive ductal carcinoma, grade 2, estrogen receptor 94% positive, progesterone receptor 11% positive, with an MIB-1 of 19% and HER-2 amplified with a signals ratio of 3.75 and the number per cell of 4.50. The patient discussed the situation with with her surgeon's and on 05/05/2013 proceeded to right lumpectomy, with the final pathology (S. is a [id number]) confirming an invasive ductal carcinoma, 0.7 cm, grade 2, within a millimeter of the posterior margin, which showed skeletal muscle. A port was placed at the time of that procedure. The patient has recovered well from her surgery. She is here now to discuss adjuvant treatment.  REVIEW OF SYSTEMS:  She tolerated the surgery well. She has had no unusual pain, fever, or bleeding problems. She is a little sore at the port site, but again without any other symptoms of concern. She has been doing and a normal some research and reading and had between 20 and 30 written out questions to discuss to that. A detailed review of systems however otherwise was stable  PAST MEDICAL HISTORY: Past Medical History  Diagnosis Date  .  Migraines   . Asthma     as a child  . Breast cancer Sept 2012    right breast  . PONV (postoperative nausea and vomiting)   . Rash     neck  . Anxiety   . Pneumonia   . Kidney stones   . GERD (gastroesophageal reflux disease)     had during chemo  . Cervical dysplasia   1. History of prior  adenoidectomy. 2. History of rhinoplasty.   3. History of multiple uterine cervical treatments for precancerous lesions, none for the past 10 years or so. 4. History of fibroid tumor surgery. 5. History of D and C. 6. History of dysplastic nevi removed by Dr. Ronnald Ramp. 7. History of right "lazy eye" corrective surgery. 8. History of approximately 10-pack-years of tobacco abuse, resolved.  9. History of possible migraines (morning headaches). History of asthma as a child.  PAST SURGICAL HISTORY: Past Surgical History  Procedure Laterality Date  . Rhinoplasty    . Adenoidectomy    . Fibroid tumor surgery    . Dilation and curettage of uterus    . Uterine cervical treatments  2002    for precancerous lesions  . Lazy eye      corrective surgery  . Mastectomy w/ sentinel node biopsy  06/29/2011    Procedure: MASTECTOMY WITH SENTINEL LYMPH NODE BIOPSY;  Surgeon: Adin Hector, MD;  Location: [place];  Service: General;  Laterality: Right;  right skin spairing mstectomy and right sentinel lymph node biopsy  . Breast reconstruction  06/29/2011    Procedure: BREAST RECONSTRUCTION;  Surgeon: Theodoro Kos, DO;  Location: [place];  Service: Plastics;  Laterality: Bilateral;  Immediate Bilateral Breast Reconstruction with Bilateral Tissue Expanders and placement of  Alloderm   . Port-a-cath removal  03/12/2012    Procedure: MINOR REMOVAL PORT-A-CATH;  Surgeon: Adin Hector, MD;  Location: [place];  Service: General;  Laterality: N/A;  Upper left  . Breast lumpectomy with needle localization Right 05/05/2013    Procedure: EXCISION RECURRENT CANCER RIGHT BREAST WITH NEEDLE LOCALOZATION;  Surgeon: Adin Hector, MD;  Location: [place];  Service: General;  Laterality: Right;  . Portacath placement Left 05/05/2013    Procedure: INSERTION PORT-A-CATH;  Surgeon: Adin Hector, MD;  Location: [place];  Service: General;  Laterality: Left;    FAMILY HISTORY Family History  Problem  Relation Age of Onset  . Adopted: Yes  . Heart attack Father   . Cancer Brother     prostate and lung  . Cancer Cousin     melanoma  . Cancer Sister     breast  . Cancer Sister     stomach, ovarian  :  The patient's biological father died at the age of 26 from myocardial infarction.  The patient's biological mother is alive at age 44.  The patient was adopted by her biological mother's parents.  She has 3 half-brothers.  No sisters.  One cousin on her mother's side died from melanoma at the age of 48.  There is other cancer history in the adopted side but these are not even half-brothers or sisters, they are children of her adopted parents who are really her grandparents so these would be uncles and aunts (1 lymph node cancer, 1 prostate cancer, 1 breast cancer at the age of 35).  GYNECOLOGIC HISTORY: She had menarche age 35 or 57.  She is GX, P1, first pregnancy to term at age 44. She stopped having  periods approximately March 2013  SOCIAL HISTORY: She works in a Engineer, agricultural.  Her husband, Annasophia Crocker owns a family business called [employer].  They have been married 5 years.  They have a son, [name], age 44.  [name] also has a daughter, [name], who lives in [place] and is 44 years old.  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  The patient is not a church attender.    ADVANCED DIRECTIVES: Not in place  HEALTH MAINTENANCE: History  Substance Use Topics  . Smoking status: Former Smoker    Quit date: 07/20/1996  . Smokeless tobacco: Never Used  . Alcohol Use: No     Colonoscopy:  PAP: [facility]  Bone density:  Lipid panel:  Allergies  Allergen Reactions  . Tussionex Pennkinetic Er [Hydrocod Polst-Cpm Polst Er] Other (See Comments)    halucinations  . Codeine Other (See Comments)    unkown  . Morphine And Related Anxiety    anxiety  . Penicillins Rash    Current Outpatient Prescriptions  Medication Sig Dispense Refill  . Acetaminophen (TYLENOL PO) Take  0.5 tablets by mouth as needed. tyleno with codeine      . aspirin-acetaminophen-caffeine (EXCEDRIN MIGRAINE) 250-250-65 MG per tablet Take 2 tablets by mouth every 6 (six) hours as needed for headache.      . b complex vitamins tablet Take 1 tablet by mouth daily.      . Biotin 5000 MCG CAPS Take 5,000 mcg by mouth daily.      . Calcium Carbonate-Vitamin D (CALCIUM-VITAMIN D) 500-200 MG-UNIT per tablet Take 1 tablet by mouth 2 (two) times daily with a meal.      . LORazepam (ATIVAN) 0.5 MG tablet Take 0.5 mg by mouth daily as needed for anxiety.      . Multiple Vitamins-Minerals (HAIR/SKIN/NAILS PO) Take 1 tablet by mouth daily.       [name] oxyCODONE-acetaminophen (PERCOCET) 7.5-325 MG per tablet Take 1 tablet by mouth every 4 (four) hours as needed for pain.  30 tablet  0  . prenatal vitamin w/FE, FA (PRENATAL 1 + 1) 27-1 MG TABS Take 1 tablet by mouth Daily.      . tamoxifen (NOLVADEX) 20 MG tablet Take 20 mg by mouth daily.       No current facility-administered medications for this visit.    OBJECTIVE: Middle-aged white woman in no acute distress Filed Vitals:   05/09/13 1000  BP: 110/76  Pulse: 76  Temp: 98.4 F (36.9 C)  Resp: 18     Body mass index is 21.83 kg/(m^2).    ECOG FS: 1 Filed Weights   05/09/13 1000  Weight: 123 lb 3.2 oz (55.883 kg)   Sclerae unicteric, pupils equal and reactive Oropharynx shows no thrush or other lesions No cervical or supraclavicular adenopathy Lungs no rales or rhonchi Heart regular rate and rhythm Abd soft, nontender, positive bowel sounds, no masses palpated MSK no focal spinal tenderness, no upper extremity lymphedema Neuro: nonfocal, well-oriented, anxious affect Breasts: Status post bilateral mastectomies with implants in place. The area of recent surgery on the right is healing very nicely, without dehiscence, swelling, erythema, or unusual tenderness. The cosmetic result is good.. The left side is unremarkable.   LAB RESULTS: Lab  Results  Component Value Date   WBC 6.1 05/05/2013   NEUTROABS 3.3 01/08/2013   HGB 13.6 05/05/2013   HCT 38.8 05/05/2013   MCV 90.9 05/05/2013   PLT 216 05/05/2013  Chemistry      Component Value Date/Time   NA 142 05/05/2013 1059   NA 141 09/23/2012 1415   K 5.0 05/05/2013 1059   K 3.6 09/23/2012 1415   CL 103 05/05/2013 1059   CL 104 09/23/2012 1415   CO2 22 05/05/2013 1059   CO2 28 09/23/2012 1415   BUN 11 05/05/2013 1059   BUN 13.0 09/23/2012 1415   CREATININE 0.70 05/05/2013 1059   CREATININE 0.77 01/08/2013 1456   CREATININE 0.8 09/23/2012 1415      Component Value Date/Time   CALCIUM 9.1 05/05/2013 1059   CALCIUM 9.3 09/23/2012 1415   ALKPHOS 47 01/08/2013 1456   ALKPHOS 58 09/23/2012 1415   AST 18 01/08/2013 1456   AST 16 09/23/2012 1415   ALT 13 01/08/2013 1456   ALT 14 09/23/2012 1415   BILITOT 0.5 01/08/2013 1456   BILITOT 0.42 09/23/2012 1415       STUDIES: Chest 2 View  05/05/2013   CLINICAL DATA:  Preoperative respiratory exam for history of breast cancer.  EXAM: CHEST - 2 VIEW  COMPARISON:  06/21/2011  FINDINGS: The heart size and mediastinal contours are within normal limits. There is no evidence of pulmonary edema, consolidation, pneumothorax, nodule or pleural fluid. The visualized skeletal structures are unremarkable.  IMPRESSION: No active disease.   Electronically Signed   By: Aletta Edouard M.D.   On: 05/05/2013 15:17   Dg Chest Port 1 View  05/05/2013   CLINICAL DATA:  Left chest port placement  EXAM: PORTABLE CHEST - 1 VIEW  COMPARISON:  05/05/2013 at 1206 hr  FINDINGS: Interval placement of a left subclavian chest power port with its tip at/just above the cavoatrial junction.  Lungs are clear. No pleural effusion or pneumothorax.  Heart is normal in size.  Surgical clips in the bilateral chest wall/axilla.  IMPRESSION: Interval placement of a left subclavian chest power port with its tip at/just above the cavoatrial junction.  No pneumothorax.   Electronically Signed    By: Julian Hy M.D.   On: 05/05/2013 15:01   Dg Fluoro Guide Cv Line-no Report  05/05/2013   CLINICAL DATA: recurrent right breast cancer   FLOURO GUIDE CV LINE  Fluoroscopy was utilized by the requesting physician.  No radiographic  interpretation.    [place] Rt Plc Breast Loc Dev   1st Lesion  Inc US Guide  05/05/2013   CLINICAL DATA:  Right breast cancer recurrence in the right breast reconstruction.  EXAM: ULTRASOUND NEEDLE LOCALIZATION OF THE RIGHT BREAST RECONSTRUCTION  COMPARISON:  04/09/2013, 04/08/2013  FINDINGS: Patient presents for needle localization prior to wide excision. I met with the patient and we discussed the procedure of needle localization including benefits and alternatives. We discussed the high likelihood of a successful procedure. We discussed the risks of the procedure, including infection, bleeding, tissue injury, and further surgery. Informed, written consent was given.  The usual time-out protocol was performed immediately prior to the procedure.  Using ultrasound guidance, sterile technique, 2% lidocaine, and a 7 cm Inrad Ultrawire needle, the mass at 4 o'clock 4 cm from the right reconstructed nipple waslocalized using a caudocranial approach. The location of the mass was marked with an indelible marker on the skin. Films were labeled and sent with the patient to surgery. She tolerated the procedure well.  Specimen radiograph is performed at [facility] Operating Room and confirms the clip and wire to be present in the tissue sample. The specimen is marked for pathology.  IMPRESSION: Needle localization right breast reconstruction. No apparent complications.   Electronically Signed   By: Ulyess Blossom M.D.   On: 05/05/2013 13:41    ASSESSMENT: 43 y.o.  BRCA 1-2 negative [name], [place] woman,   (1) status post right breast biopsy in September 2012 for a clinically 2.0 cm invasive ductal carcinoma, with some enlarged Right axillary lymph nodes, the largest  one of which was negative by biopsy. The tumor was grade 2, estrogen receptor positive at 88%, progesterone receptor positive at 11%, HER-2/neu positive by CISH with a ratio of 4.61, and a proliferation marker of 36%.   (2) treated in the neoadjuvant setting with Q 3 week docetaxel/ carboplatin/ trastuzumab x6, completed 05/18/2011.  Continued on trastuzumab every 3 weeks to complete one year, last dose in November 2013.  (3) s/p bilateral mastectomies with sentinel node biopsy 06/29/2011 for a residual right-sided ypT1c ypN0 invasive ductal carcinoma, grade 2, with immediate expander placement  (4) on tamoxifen as of May 2013  (5) status post left lumpectomy with left-sided sentinel lymph node sampling 05/05/2013 for a pT1b pN0, stage IA invasive ductal carcinoma, estrogen receptor 94% positive, progesterone receptor 11% positive, with an MIB-1 of 19% and HER-2 amplification  PLAN:  [name] had many questions today and today's visit was well over one hour. She understands patients who have local recurrence of their cancer are at high-risk for systemic recurrence. NCCN guidelines recommend endocrine systemic therapy (in addition to surgery and radiation) in cases like this. However we do not have category 1 data to guide [place] here, and particularly in HER-2 positive patients now that we have the option of TDM 1 I would prefer to use that agent (in prior years I would have used chemotherapy plus trastuzumab before starting an aromatase inhibitor).  I went over all this with the patient and her husband today. She understands the possible toxicities, side effects and complications of TDM 1. We will obtain an echocardiogram before the start of therapy. I do not think she will have a problem with nausea, but we wrote her a prescription for Compazine 5 mg to take as needed just in case. After 6 months of TDM 1, we will start the patient on anastrozole and she will receive radiation under Dr. Valere Dross.  [name]  had questions regarding why her Pap smear would change at the same time as her tumor, whether she might be eating too much protein, whether she should have a blood transfusion, while her MRI and September did not show any mass, and multiple questions regarding the pathology reports and her prior labwork. We will go over these in detail with her and I trust that this point she is well informed. She does have a good understanding of the overall plan, and agrees with it. She knows the goal of treatment in her case is cure. She will call with any problems that may develop before her next visit here.      Aftan Vint C    05/09/2013

## 2013-05-11 ENCOUNTER — Other Ambulatory Visit: Payer: Self-pay | Admitting: Oncology

## 2013-05-11 DIAGNOSIS — C50919 Malignant neoplasm of unspecified site of unspecified female breast: Secondary | ICD-10-CM

## 2013-05-12 ENCOUNTER — Telehealth: Payer: Self-pay | Admitting: Oncology

## 2013-05-12 ENCOUNTER — Telehealth: Payer: Self-pay | Admitting: *Deleted

## 2013-05-12 NOTE — Telephone Encounter (Signed)
sw pt gv appt for 05/23/13 w/ labs@ 2pm and ov@ 2:30pm. Pt is aware...td

## 2013-05-12 NOTE — Telephone Encounter (Signed)
s/w pt re echo appt @ [facility] 2/4 @ 9am and lb/AB 2/12 @ 2:15pm. pt aware 2/13 appts cx'd. per 2/1 pof have pt see AB 2/12 @ 2:45pm. other appts remain the same and pt will get new schedule 2/5. pt will call back tomorrow to speak w/desk nurse about echo and to see if the one she had towards the end of 2013 can be used vs repeating test.

## 2013-05-13 ENCOUNTER — Telehealth: Payer: Self-pay | Admitting: *Deleted

## 2013-05-13 ENCOUNTER — Other Ambulatory Visit: Payer: Self-pay | Admitting: Physician Assistant

## 2013-05-13 NOTE — Telephone Encounter (Signed)
Pt called to this RN to state she is unable to make appointment in AM for echo and is inquiring if test has to be done now due to previous echo normal.  Note pt had previous ECHO in 2013 post herceptin therapy.  Jalan states she does not want to postpone chemo if possible.  Per MD ok to reschedule ECHO- but will need to obtain, and keep chemo as scheduled.

## 2013-05-14 ENCOUNTER — Telehealth: Payer: Self-pay | Admitting: Oncology

## 2013-05-14 ENCOUNTER — Ambulatory Visit (HOSPITAL_COMMUNITY): Payer: BC Managed Care – PPO

## 2013-05-14 NOTE — Telephone Encounter (Signed)
s/w pt re new d/t for echo 2/12 @ 11am. also confirmed 2/12 appt for lb/AB @ 2:15pm. per staff message from AB move 2/12 appt to 2/11 @ 8:45am for 12min. AB aware per pt she cannot come 2/11. lb/fu remains 2/12 - ok per AB. per 2/3 pof r/s echo to next available - date per pt.

## 2013-05-15 ENCOUNTER — Other Ambulatory Visit (HOSPITAL_BASED_OUTPATIENT_CLINIC_OR_DEPARTMENT_OTHER): Payer: BC Managed Care – PPO

## 2013-05-15 ENCOUNTER — Ambulatory Visit (HOSPITAL_BASED_OUTPATIENT_CLINIC_OR_DEPARTMENT_OTHER): Payer: BC Managed Care – PPO

## 2013-05-15 VITALS — BP 111/76 | HR 74 | Temp 97.0°F | Resp 18

## 2013-05-15 DIAGNOSIS — C50919 Malignant neoplasm of unspecified site of unspecified female breast: Secondary | ICD-10-CM

## 2013-05-15 DIAGNOSIS — C50311 Malignant neoplasm of lower-inner quadrant of right female breast: Secondary | ICD-10-CM

## 2013-05-15 DIAGNOSIS — Z9013 Acquired absence of bilateral breasts and nipples: Secondary | ICD-10-CM

## 2013-05-15 DIAGNOSIS — Z5112 Encounter for antineoplastic immunotherapy: Secondary | ICD-10-CM

## 2013-05-15 DIAGNOSIS — C50319 Malignant neoplasm of lower-inner quadrant of unspecified female breast: Secondary | ICD-10-CM

## 2013-05-15 LAB — CBC WITH DIFFERENTIAL/PLATELET
BASO%: 0.3 % (ref 0.0–2.0)
Basophils Absolute: 0 10*3/uL (ref 0.0–0.1)
EOS%: 1.2 % (ref 0.0–7.0)
Eosinophils Absolute: 0.1 10*3/uL (ref 0.0–0.5)
HCT: 39.5 % (ref 34.8–46.6)
HGB: 13.3 g/dL (ref 11.6–15.9)
LYMPH%: 28.8 % (ref 14.0–49.7)
MCH: 31.7 pg (ref 25.1–34.0)
MCHC: 33.6 g/dL (ref 31.5–36.0)
MCV: 94.2 fL (ref 79.5–101.0)
MONO#: 0.4 10*3/uL (ref 0.1–0.9)
MONO%: 5.9 % (ref 0.0–14.0)
NEUT#: 3.8 10*3/uL (ref 1.5–6.5)
NEUT%: 63.8 % (ref 38.4–76.8)
Platelets: 191 10*3/uL (ref 145–400)
RBC: 4.2 10*6/uL (ref 3.70–5.45)
RDW: 13.2 % (ref 11.2–14.5)
WBC: 6 10*3/uL (ref 3.9–10.3)
lymph#: 1.7 10*3/uL (ref 0.9–3.3)

## 2013-05-15 LAB — COMPREHENSIVE METABOLIC PANEL (CC13)
ALT: 14 U/L (ref 0–55)
AST: 18 U/L (ref 5–34)
Albumin: 4 g/dL (ref 3.5–5.0)
Alkaline Phosphatase: 59 U/L (ref 40–150)
Anion Gap: 7 mEq/L (ref 3–11)
BUN: 15.8 mg/dL (ref 7.0–26.0)
CO2: 29 mEq/L (ref 22–29)
Calcium: 10.2 mg/dL (ref 8.4–10.4)
Chloride: 106 mEq/L (ref 98–109)
Creatinine: 0.8 mg/dL (ref 0.6–1.1)
Glucose: 91 mg/dl (ref 70–140)
Potassium: 4.8 mEq/L (ref 3.5–5.1)
Sodium: 142 mEq/L (ref 136–145)
Total Bilirubin: 0.47 mg/dL (ref 0.20–1.20)
Total Protein: 6.5 g/dL (ref 6.4–8.3)

## 2013-05-15 MED ORDER — HEPARIN SOD (PORK) LOCK FLUSH 100 UNIT/ML IV SOLN
500.0000 [IU] | Freq: Once | INTRAVENOUS | Status: AC | PRN
  Administered 2013-05-15: 500 [IU]
  Filled 2013-05-15: qty 5

## 2013-05-15 MED ORDER — SODIUM CHLORIDE 0.9 % IV SOLN
3.6000 mg/kg | Freq: Once | INTRAVENOUS | Status: AC
  Administered 2013-05-15: 200 mg via INTRAVENOUS
  Filled 2013-05-15: qty 10

## 2013-05-15 MED ORDER — SODIUM CHLORIDE 0.9 % IV SOLN
Freq: Once | INTRAVENOUS | Status: AC
  Administered 2013-05-15: 12:00:00 via INTRAVENOUS

## 2013-05-15 MED ORDER — DIPHENHYDRAMINE HCL 25 MG PO CAPS
50.0000 mg | ORAL_CAPSULE | Freq: Once | ORAL | Status: AC
  Administered 2013-05-15: 25 mg via ORAL

## 2013-05-15 MED ORDER — SODIUM CHLORIDE 0.9 % IJ SOLN
10.0000 mL | INTRAMUSCULAR | Status: DC | PRN
  Administered 2013-05-15: 10 mL
  Filled 2013-05-15: qty 10

## 2013-05-15 MED ORDER — ACETAMINOPHEN 325 MG PO TABS
ORAL_TABLET | ORAL | Status: AC
  Filled 2013-05-15: qty 1

## 2013-05-15 MED ORDER — DIPHENHYDRAMINE HCL 25 MG PO CAPS
ORAL_CAPSULE | ORAL | Status: AC
  Filled 2013-05-15: qty 1

## 2013-05-15 MED ORDER — ACETAMINOPHEN 325 MG PO TABS
650.0000 mg | ORAL_TABLET | Freq: Once | ORAL | Status: AC
  Administered 2013-05-15: 325 mg via ORAL

## 2013-05-15 NOTE — Patient Instructions (Signed)
[facility] Discharge Instructions for Patients Receiving Chemotherapy  Today you received the following chemotherapy agents:   To help prevent nausea and vomiting after your treatment, we encourage you to take your nausea medication.  Take it as often as prescribed.     If you develop nausea and vomiting that is not controlled by your nausea medication, call the clinic. If it is after clinic hours your family physician or the after hours number for the clinic or go to the Emergency Department.   BELOW ARE SYMPTOMS THAT SHOULD BE REPORTED IMMEDIATELY:  *FEVER GREATER THAN 100.5 F  *CHILLS WITH OR WITHOUT FEVER  NAUSEA AND VOMITING THAT IS NOT CONTROLLED WITH YOUR NAUSEA MEDICATION  *UNUSUAL SHORTNESS OF BREATH  *UNUSUAL BRUISING OR BLEEDING  TENDERNESS IN MOUTH AND THROAT WITH OR WITHOUT PRESENCE OF ULCERS  *URINARY PROBLEMS  *BOWEL PROBLEMS  UNUSUAL RASH Items with * indicate a potential emergency and should be followed up as soon as possible.  One of the nurses will contact you 24 hours after your treatment. Please let the nurse know about any problems that you may have experienced. Feel free to call the clinic you have any questions or concerns. The clinic phone number is [phone].   I have been informed and understand all the instructions given to me. I know to contact the clinic, my physician, or go to the Emergency Department if any problems should occur. I do not have any questions at this time, but understand that I may call the clinic during office hours   should I have any questions or need assistance in obtaining follow up care.    __________________________________________  _____________  __________ Signature of Patient or Authorized Representative            Date                   Time    __________________________________________ Nurse's Signature   Anastrozole tablets (Arimidex)  What is this medicine? ANASTROZOLE (an AS troe zole)  is used to treat breast cancer in women who have gone through menopause. Some types of breast cancer depend on estrogen to grow, and this medicine can stop tumor growth by blocking estrogen production. This medicine may be used for other purposes; ask your health care provider or pharmacist if you have questions. COMMON BRAND NAME(S): Arimidex What should I tell my health care provider before I take this medicine? They need to know if you have any of these conditions: -liver disease -an unusual or allergic reaction to anastrozole, other medicines, foods, dyes, or preservatives -pregnant or trying to get pregnant -breast-feeding How should I use this medicine? Take this medicine by mouth with a glass of water. Follow the directions on the prescription label. You can take this medicine with or without food. Take your doses at regular intervals. Do not take your medicine more often than directed. Do not stop taking except on the advice of your doctor or health care professional. Talk to your pediatrician regarding the use of this medicine in children. Special care may be needed. Overdosage: If you think you have taken too much of this medicine contact a poison control center or emergency room at once. NOTE: This medicine is only for you. Do not share this medicine with others. What if I miss a dose? If you miss a dose, take it as soon as you can. If it is almost time for your next dose, take only that dose. Do not  take double or extra doses. What may interact with this medicine? Do not take this medicine with any of the following medications: -female hormones, like estrogens or progestins and birth control pills This medicine may also interact with the following medications: -tamoxifen This list may not describe all possible interactions. Give your health care provider a list of all the medicines, herbs, non-prescription drugs, or dietary supplements you use. Also tell them if you smoke, drink  alcohol, or use illegal drugs. Some items may interact with your medicine. What should I watch for while using this medicine? Visit your doctor or health care professional for regular checks on your progress. Let your doctor or health care professional know about any unusual vaginal bleeding. Do not treat yourself for diarrhea, nausea, vomiting or other side effects. Ask your doctor or health care professional for advice. What side effects may I notice from receiving this medicine? Side effects that you should report to your doctor or health care professional as soon as possible: -allergic reactions like skin rash, itching or hives, swelling of the face, lips, or tongue -any new or unusual symptoms -breathing problems -chest pain -leg pain or swelling -vomiting Side effects that usually do not require medical attention (report to your doctor or health care professional if they continue or are bothersome): -back or bone pain -cough, or throat infection -diarrhea or constipation -dizziness -headache -hot flashes -loss of appetite -nausea -sweating -weakness and tiredness -weight gain This list may not describe all possible side effects. Call your doctor for medical advice about side effects. You may report side effects to FDA at 1-800-FDA-1088. Where should I keep my medicine? Keep out of the reach of children. Store at room temperature between 20 and 25 degrees C (68 and 77 degrees F). Throw away any unused medicine after the expiration date. NOTE: This sheet is a summary. It may not cover all possible information. If you have questions about this medicine, talk to your doctor, pharmacist, or health care provider.  2014, Elsevier/Gold Standard. (2007-06-07 16:31:52)

## 2013-05-15 NOTE — Progress Notes (Signed)
Pt reports taking Excedrin at 8 am, Tylenol 325 and Benadryl 25 at 10 am, and Ativan on the way to the cancer center.  Therefore, pre-meds given 325 mg Tylenol and 25 mg Benadryl.

## 2013-05-16 ENCOUNTER — Telehealth: Payer: Self-pay | Admitting: *Deleted

## 2013-05-16 LAB — FOLLICLE STIMULATING HORMONE: FSH: 36.1 m[IU]/mL

## 2013-05-16 LAB — LUTEINIZING HORMONE: LH: 16 m[IU]/mL

## 2013-05-16 MED ORDER — LORAZEPAM 0.5 MG PO TABS: 0.5000 mg | ORAL_TABLET | Freq: Two times a day (BID) | ORAL | Status: DC | PRN

## 2013-05-16 MED ORDER — ONDANSETRON HCL 8 MG PO TABS: 8.0000 mg | ORAL_TABLET | Freq: Two times a day (BID) | ORAL | Status: DC | PRN

## 2013-05-16 NOTE — Telephone Encounter (Signed)
Called [name] at [facility] number([facility]).  Messge left requesting a return call for chemotherapy follow up.  Awaiting return call from patient.

## 2013-05-16 NOTE — Telephone Encounter (Signed)
This RN reviewed reported symptoms and pt concerns per chemo follow up call.  Per MD recommendation given for zofran prescription.  This RN called pt's pharmacy and above prescription given as well as refill on pt's lorazepam.  Message left on pt's identified vm per above as well as her concerns about prior MRI and comparison readings can be reviewed at her next office visit.

## 2013-05-16 NOTE — Telephone Encounter (Signed)
F/U call returned.  Reports she "arrived home at 1830 after first Kadcyla, initially felt okay.  Felt like her legs were swelling, laid down for an hour then went to bed.  Felt freezing cold.  While in bed felt tingling to legs, arms, hands, feet.  Dizzy despite lying down.  Through the night, did not feel good.  Got up at 4:30 am and felt hot, sat in the floor with a cold rag to face because I felt hot, funny in the head and balance was off.  Rested and at 6:45 am I had a headache, threw up and now I feel a little better."    Did not check her temperature and doesn't think she had a fever.  Did not use compazine reporting "it says it's for schizophrenia and I'm afraid to take this.  I was not notified if lorazepam has been called in yet.  In 2012 I had ondansetron and metoclopramide and these worked well.  Can I have these with this new treatment?"    Asked for copies of 2012 MRI's and has questions about the September 2014 MRI.  Instructed to see H.I.M and discuss MRI with providers during visit for 05-22-2013.  Will notify provider about request for different anti-emetics.  Instructed to ask staff for a thermometer on 05-22-2013 also.

## 2013-05-20 LAB — ESTRADIOL, ULTRA SENS: Estradiol, Ultra Sensitive: 113 pg/mL

## 2013-05-21 ENCOUNTER — Other Ambulatory Visit: Payer: Self-pay | Admitting: Physician Assistant

## 2013-05-22 ENCOUNTER — Ambulatory Visit (HOSPITAL_COMMUNITY)
Admission: RE | Admit: 2013-05-22 | Discharge: 2013-05-22 | Disposition: A | Discharge status: Home / Self Care | Payer: BC Managed Care – PPO | Source: Ambulatory Visit | Attending: Oncology | Admitting: Oncology

## 2013-05-22 ENCOUNTER — Telehealth: Payer: Self-pay | Admitting: Oncology

## 2013-05-22 ENCOUNTER — Other Ambulatory Visit: Payer: Self-pay | Admitting: Oncology

## 2013-05-22 ENCOUNTER — Ambulatory Visit (HOSPITAL_BASED_OUTPATIENT_CLINIC_OR_DEPARTMENT_OTHER): Payer: BC Managed Care – PPO | Admitting: Physician Assistant

## 2013-05-22 ENCOUNTER — Other Ambulatory Visit (HOSPITAL_BASED_OUTPATIENT_CLINIC_OR_DEPARTMENT_OTHER): Payer: BC Managed Care – PPO

## 2013-05-22 ENCOUNTER — Encounter: Payer: Self-pay | Admitting: Physician Assistant

## 2013-05-22 VITALS — BP 103/66 | HR 91 | Temp 98.3°F | Resp 18 | Ht 63.0 in | Wt 126.0 lb

## 2013-05-22 DIAGNOSIS — C50919 Malignant neoplasm of unspecified site of unspecified female breast: Secondary | ICD-10-CM

## 2013-05-22 DIAGNOSIS — Z170 Estrogen receptor positive status [ER+]: Secondary | ICD-10-CM

## 2013-05-22 DIAGNOSIS — Z9013 Acquired absence of bilateral breasts and nipples: Secondary | ICD-10-CM

## 2013-05-22 DIAGNOSIS — C50319 Malignant neoplasm of lower-inner quadrant of unspecified female breast: Secondary | ICD-10-CM

## 2013-05-22 DIAGNOSIS — Z01818 Encounter for other preprocedural examination: Secondary | ICD-10-CM | POA: Insufficient documentation

## 2013-05-22 DIAGNOSIS — D696 Thrombocytopenia, unspecified: Secondary | ICD-10-CM

## 2013-05-22 DIAGNOSIS — C50311 Malignant neoplasm of lower-inner quadrant of right female breast: Secondary | ICD-10-CM

## 2013-05-22 HISTORY — DX: Thrombocytopenia, unspecified: D69.6

## 2013-05-22 LAB — COMPREHENSIVE METABOLIC PANEL (CC13)
ALT: 40 U/L (ref 0–55)
AST: 61 U/L — ABNORMAL HIGH (ref 5–34)
Albumin: 3.9 g/dL (ref 3.5–5.0)
Alkaline Phosphatase: 64 U/L (ref 40–150)
Anion Gap: 10 mEq/L (ref 3–11)
BUN: 12.9 mg/dL (ref 7.0–26.0)
CO2: 29 mEq/L (ref 22–29)
Calcium: 10.2 mg/dL (ref 8.4–10.4)
Chloride: 104 mEq/L (ref 98–109)
Creatinine: 0.8 mg/dL (ref 0.6–1.1)
Glucose: 79 mg/dl (ref 70–140)
Potassium: 3.5 mEq/L (ref 3.5–5.1)
Sodium: 143 mEq/L (ref 136–145)
Total Bilirubin: 0.31 mg/dL (ref 0.20–1.20)
Total Protein: 6.7 g/dL (ref 6.4–8.3)

## 2013-05-22 LAB — CBC WITH DIFFERENTIAL/PLATELET
BASO%: 0.4 % (ref 0.0–2.0)
Basophils Absolute: 0 10*3/uL (ref 0.0–0.1)
EOS%: 1.7 % (ref 0.0–7.0)
Eosinophils Absolute: 0.1 10*3/uL (ref 0.0–0.5)
HCT: 38.9 % (ref 34.8–46.6)
HGB: 13 g/dL (ref 11.6–15.9)
LYMPH%: 32.6 % (ref 14.0–49.7)
MCH: 31.1 pg (ref 25.1–34.0)
MCHC: 33.4 g/dL (ref 31.5–36.0)
MCV: 93.1 fL (ref 79.5–101.0)
MONO#: 0.4 10*3/uL (ref 0.1–0.9)
MONO%: 7.8 % (ref 0.0–14.0)
NEUT#: 3.2 10*3/uL (ref 1.5–6.5)
NEUT%: 57.5 % (ref 38.4–76.8)
Platelets: 101 10*3/uL — ABNORMAL LOW (ref 145–400)
RBC: 4.18 10*6/uL (ref 3.70–5.45)
RDW: 13 % (ref 11.2–14.5)
WBC: 5.6 10*3/uL (ref 3.9–10.3)
lymph#: 1.8 10*3/uL (ref 0.9–3.3)

## 2013-05-22 NOTE — Telephone Encounter (Signed)
gv and printed appt sched an davs for pt fro Feb thru April

## 2013-05-22 NOTE — Progress Notes (Signed)
ID: [name]   DOB: [birth year]  MR#: [id number]  NOM#:[id number]   PCP: Chauncey Cruel, MD GYN: Baltazar Najjar MD SU: Fanny Skates MD OTHER MD: Theodoro Kos, [name]  CHIEF COMPLAINT:  Recurrent Breast Cancer, Right   HISTORY OF PRESENT ILLNESS:  [name] (goes by "[name]") is a [place] woman who, at the age of 43, was referred by Dr. Baltazar Najjar for treatment of right breast carcinoma.   1.  The patient palpated a mass in her right breast which she brought to Dr. Jacelyn Grip attention. A prior screening mammogram on 02/06/2010 had been unremarkable. The patient was then scheduled for bilateral diagnostic mammogram and right breast ultrasonography on 12/22/2010, the studies demonstrating an ill defined mass with heterogeneous calcifications in the inner lower right breast, measuring 1.7 cm. The breasts are heterogeneously dense, but there were no other mammographic abnormalities in either breast. Clinically, there was a palpable firm mass at the 5:00 position of the right breast, 5 cm from the nipple. There were no palpable abnormalities in the right axilla. Ultrasound confirmed a 1.7 cm irregular, hypoechoic mass in the same position. There were some lymph nodes noted in the right exam of, one measuring 1.6 cm with a minimally thickened cortex inferiorly.  An ultrasound-guided core biopsy on 12/23/2010 [phone]) showed an invasive ductal carcinoma, grade 2, ER positive at 88%, PR positive at 11 is %, HER-2/neu positive with a FISH ratio of 4.61, and an MIB-1 of 36%.  The patient was referred to Dr. Fanny Skates and bilateral breast MRIs were obtained 12/30/2010. This showed the mass in question in the right breast to measure 2.0 cm. In the right axilla there was a 1.8 cm lymph node with a thickened cortex. The left breast was unremarkable.  Her subsequent treatment is as detailed below.  2.   Azile then had a restaging PET scan late December 2014 which  was positive only for a small subcutaneous nodule in the right breast lower outer quadrant. There was no other finding of concern. Ultrasound of both breasts 04/08/2013 showed a nodule in the right breast at the 4:00 position measuring 0.7 cm. The right axilla was clear. There was an area at the 7:00 position of the left breast which was of concern by palpation but showed only normal tissue.   Biopsy of the right breast mass 04/09/2013 showed an invasive ductal carcinoma, grade 2, estrogen receptor 94% positive, progesterone receptor 11% positive, with an MIB-1 of 19% and HER-2 amplified with a signals ratio of 3.75 and the number per cell of 4.50. The patient discussed the situation with with her surgeon's and on 05/05/2013 proceeded to right lumpectomy, with the final pathology (S. is a [id number]) confirming an invasive ductal carcinoma, 0.7 cm, grade 2, within a millimeter of the posterior margin, which showed skeletal muscle. A port was placed at the time of that procedure.  Her subsequent treatment is as detailed below.    INTERVAL HISTORY:  [name] returns alone today for followup of her recurrent right breast cancer.   She is day 8 cycle 1 of every 3 week TDM-1.  She tolerated the infusion well. She did have some associated side effects, however, later that night and the next day. Her legs felt like they were swelling. She had dizziness. She felt cold and then hot , "like she was going to pass out", but fortunately she did not. She doesn't think she had a fever, but did not take her temperature.  She had a headache the next morning and vomited x1. She had no additional problems with nausea or emesis. She continued, however, to have a "weird feeling" in her head for several days. She says it is difficult to describe, but felt like there was a followed or pressure around her head, and that it felt "tingly" at times.  She took one dose of prochlorperazine Tuesday (day 6) and the "head feeling" went away.    [name] again had many questions regarding her breast cancer recurrence, and asked again for [place] to go over and comparedthe sternum MRI from September 2014 and the PET scan from December. She was worried about the fact that the MRI in September mentioned a left supraclavicular noe and a prevascular lymph node, but both of these were less than 1 cm in size, and neither of them "lit up" on the PET scan in 3 months later.   REVIEW OF SYSTEMS:  Physically, [name] has had no illnesses and denies any known fevers. She did have some chills as noted above on the night following treatment last week. She's had no rashes or skin changes. She denies any abnormal bruising. She has some blood in the mucus when she blows her nose, and she continues to have a runny nose along with some sinus congestion. She has had some mild rectal bleeding associated with hemorrhoids, but this has resolved. She's had no mouth ulcers or oral sensitivity. She's eating reasonably well and has had no additional nausea. She's had no change in bowel or bladder habits. She's had no cough, phlegm production, pleurisy, orthopnea, peripheral swelling, chest pain, or palpitations. She continues to have occasional headaches which are not out of the norm for her. She's had no additional dizziness since last week. Her vision is sometimes a little blurred. She continues to have some anxiety but denies depression or suicidal ideation. She has some back pain which is stable.    A detailed review of systems was otherwise stable.  PAST MEDICAL HISTORY: Past Medical History  Diagnosis Date  . Migraines   . Asthma     as a child  . Breast cancer Sept 2012    right breast  . PONV (postoperative nausea and vomiting)   . Rash     neck  . Anxiety   . Pneumonia   . Kidney stones   . GERD (gastroesophageal reflux disease)     had during chemo  . Cervical dysplasia   1. History of prior adenoidectomy. 2. History of rhinoplasty.   3. History of  multiple uterine cervical treatments for precancerous lesions, none for the past 10 years or so. 4. History of fibroid tumor surgery. 5. History of D and C. 6. History of dysplastic nevi removed by Dr. Ronnald Ramp. 7. History of right "lazy eye" corrective surgery. 8. History of approximately 10-pack-years of tobacco abuse, resolved.  9. History of possible migraines (morning headaches). History of asthma as a child.  PAST SURGICAL HISTORY: Past Surgical History  Procedure Laterality Date  . Rhinoplasty    . Adenoidectomy    . Fibroid tumor surgery    . Dilation and curettage of uterus    . Uterine cervical treatments  2002    for precancerous lesions  . Lazy eye      corrective surgery  . Mastectomy w/ sentinel node biopsy  06/29/2011    Procedure: MASTECTOMY WITH SENTINEL LYMPH NODE BIOPSY;  Surgeon: Adin Hector, MD;  Location: [facility];  Service: General;  Laterality:  Right;  right skin spairing mstectomy and right sentinel lymph node biopsy  . Breast reconstruction  06/29/2011    Procedure: BREAST RECONSTRUCTION;  Surgeon: Theodoro Kos, DO;  Location: [facility];  Service: Plastics;  Laterality: Bilateral;  Immediate Bilateral Breast Reconstruction with Bilateral Tissue Expanders and placement of  Alloderm   . Port-a-cath removal  03/12/2012    Procedure: MINOR REMOVAL PORT-A-CATH;  Surgeon: Adin Hector, MD;  Location: [facility];  Service: General;  Laterality: N/A;  Upper left  . Breast lumpectomy with needle localization Right 05/05/2013    Procedure: EXCISION RECURRENT CANCER RIGHT BREAST WITH NEEDLE LOCALOZATION;  Surgeon: Adin Hector, MD;  Location: [facility];  Service: General;  Laterality: Right;  . Portacath placement Left 05/05/2013    Procedure: INSERTION PORT-A-CATH;  Surgeon: Adin Hector, MD;  Location: [facility];  Service: General;  Laterality: Left;    FAMILY HISTORY Family History  Problem Relation Age of Onset  . Adopted: Yes  . Heart attack Father    . Cancer Brother     prostate and lung  . Cancer Cousin     melanoma  . Cancer Sister     breast  . Cancer Sister     stomach, ovarian  :  The patient's biological father died at the age of 41 from myocardial infarction.  The patient's biological mother is alive at age 67.  The patient was adopted by her biological mother's parents.  She has 3 half-brothers.  No sisters.  One cousin on her mother's side died from melanoma at the age of 34.  There is other cancer history in the adopted side but these are not even half-brothers or sisters, they are children of her adopted parents who are really her grandparents so these would be uncles and aunts (1 lymph node cancer, 1 prostate cancer, 1 breast cancer at the age of 75).  GYNECOLOGIC HISTORY:   (Updated 05/22/2013) She had menarche age 4 or 3.  She is GX, P1, first pregnancy to term at age 50. She stopped having periods approximately March 2013.  Hormone levels in February 2015 show patient to be premenopausal, with an estradiol of 113 on 05/15/2013.  SOCIAL HISTORY: (Updated 05/22/2013) She works in a Engineer, agricultural.  Her husband, [name] owns a family business called [employer].  They have been married 5 years.  They have a son, [name], age 28. Romilda Joy also has a daughter, [name], who lives in [place] and is 44 years old.  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  The patient is not a church attender.    ADVANCED DIRECTIVES: Not in place  HEALTH MAINTENANCE:  (Updated 05/22/2013) History  Substance Use Topics  . Smoking status: Former Smoker    Quit date: 07/20/1996  . Smokeless tobacco: Never Used  . Alcohol Use: No     Colonoscopy:  Never  PAP: Oct 2014/[facility]  Bone density: Oct 2014, mild osteopenia with a T score of -1.1  Lipid panel: Not on file  Allergies  Allergen Reactions  . Tussionex Pennkinetic Er [Hydrocod Polst-Cpm Polst Er] Other (See Comments)    halucinations  . Codeine Other (See Comments)     unkown  . Morphine Rash  . Morphine And Related Anxiety    anxiety  . Penicillin G Rash  . Penicillins Rash    Current Outpatient Prescriptions  Medication Sig Dispense Refill  . Acetaminophen (TYLENOL PO) Take 0.5 tablets by mouth as needed. tyleno  with codeine      . b complex vitamins tablet Take 1 tablet by mouth daily.      . Biotin 5000 MCG CAPS Take 5,000 mcg by mouth daily.      . Calcium Carbonate-Vitamin D (CALCIUM-VITAMIN D) 500-200 MG-UNIT per tablet Take 1 tablet by mouth 2 (two) times daily with a meal.      . lidocaine-prilocaine (EMLA) cream Apply cream to port 1-2hours prior to procedure  30 g  2  . LORazepam (ATIVAN) 0.5 MG tablet Take 1 tablet (0.5 mg total) by mouth 2 (two) times daily as needed for anxiety.  60 tablet  0  . Multiple Vitamins-Minerals (HAIR/SKIN/NAILS PO) Take 1 tablet by mouth daily.       [name] oxyCODONE-acetaminophen (PERCOCET) 7.5-325 MG per tablet Take 1 tablet by mouth every 4 (four) hours as needed for pain.  30 tablet  0  . prenatal vitamin w/FE, FA (PRENATAL 1 + 1) 27-1 MG TABS Take 1 tablet by mouth Daily.      [name] aspirin-acetaminophen-caffeine (EXCEDRIN MIGRAINE) 250-250-65 MG per tablet Take 2 tablets by mouth every 6 (six) hours as needed for headache.      . chlorpheniramine (ALLERGY) 4 MG tablet Take by mouth.      . diazepam (VALIUM) 5 MG tablet       . fluconazole (DIFLUCAN) 150 MG tablet       . HYDROcodone-acetaminophen (NORCO/VICODIN) 5-325 MG per tablet       . ibuprofen (ADVIL,MOTRIN) 800 MG tablet       . Omega-3 1000 MG CAPS Take 2 g by mouth daily.      . ondansetron (ZOFRAN) 8 MG tablet Take 1 tablet (8 mg total) by mouth 2 (two) times daily as needed for nausea or vomiting.  20 tablet  1  . prochlorperazine (COMPAZINE) 5 MG tablet Take 1 tablet (5 mg total) by mouth every 6 (six) hours as needed for nausea or vomiting.  30 tablet  0   No current facility-administered medications for this visit.    OBJECTIVE: Middle-aged  white woman in no acute distress Filed Vitals:   05/22/13 1445  BP: 103/66  Pulse: 91  Temp: 98.3 F (36.8 C)  Resp: 18     Body mass index is 22.33 kg/(m^2).    ECOG FS: 0 Filed Weights   05/22/13 1445  Weight: 126 lb (57.153 kg)   Physical Exam: HEENT:  Sclerae anicteric.  Oropharynx clear and moist with no ulcerations or candidiasis. Neck supple, trachea midline.,   NODES:  No cervical or supraclavicular lymphadenopathy palpated.  BREAST EXAM: Deferred. Axillae are benign bilaterally, no palpable lymphadenopathy.  LUNGS:  Clear to auscultation bilaterally.  No wheezes or rhonchi HEART:  Regular rate and rhythm. No murmur  ABDOMEN:  Soft,  thin, nontender.  Positive bowel sounds.  MSK:  No focal spinal tenderness to palpation.  good range of motion bilaterally in the upper extremities.  EXTREMITIES:  No peripheral edema.   SKIN:  No visible rashes or skin lesions. No excessive ecchymoses. No petechiae. No pallor.  NEURO:  Nonfocal. Well oriented.  Appropriate  affect.     LAB RESULTS: Lab Results  Component Value Date   WBC 5.6 05/22/2013   NEUTROABS 3.2 05/22/2013   HGB 13.0 05/22/2013   HCT 38.9 05/22/2013   MCV 93.1 05/22/2013   PLT 101* 05/22/2013      Chemistry      Component Value Date/Time   NA 143 05/22/2013 1425  NA 142 05-24-13 1059   K 3.5 05/22/2013 1425   K 5.0 05/24/2013 1059   CL 103 05-24-2013 1059   CL 104 09/23/2012 1415   CO2 29 05/22/2013 1425   CO2 22 24-May-2013 1059   BUN 12.9 05/22/2013 1425   BUN 11 24-May-2013 1059   CREATININE 0.8 05/22/2013 1425   CREATININE 0.70 2013/05/24 1059   CREATININE 0.77 01/08/2013 1456      Component Value Date/Time   CALCIUM 10.2 05/22/2013 1425   CALCIUM 9.1 2013-05-24 1059   ALKPHOS 64 05/22/2013 1425   ALKPHOS 47 01/08/2013 1456   AST 61* 05/22/2013 1425   AST 18 01/08/2013 1456   ALT 40 05/22/2013 1425   ALT 13 01/08/2013 1456   BILITOT 0.31 05/22/2013 1425   BILITOT 0.5 01/08/2013 1456       STUDIES:   DG  Chest Port 1 View 05-24-2013   CLINICAL DATA:  Left chest port placement  EXAM: PORTABLE CHEST - 1 VIEW  COMPARISON:  05/24/13 at 1206 hr  FINDINGS: Interval placement of a left subclavian chest power port with its tip at/just above the cavoatrial junction.  Lungs are clear. No pleural effusion or pneumothorax.  Heart is normal in size.  Surgical clips in the bilateral chest wall/axilla.  IMPRESSION: Interval placement of a left subclavian chest power port with its tip at/just above the cavoatrial junction.  No pneumothorax.   Electronically Signed   By: Julian Hy M.D.   On: May 24, 2013 15:01      ASSESSMENT: 43 y.o.  BRCA 1-2 negative [name],  woman,   (1) status post right breast biopsy in September 2012 for a clinically 2.0 cm invasive ductal carcinoma, with some enlarged Right axillary lymph nodes, the largest one of which was negative by biopsy. The tumor was grade 2, estrogen receptor positive at 88%, progesterone receptor positive at 11%, HER-2/neu positive by CISH with a ratio of 4.61, and a proliferation marker of 36%.   (2) treated in the neoadjuvant setting with Q 3 week docetaxel/ carboplatin/ trastuzumab x6, completed 05/18/2011.  Continued on trastuzumab every 3 weeks to complete one year, last dose in November 2013.  (3) s/p bilateral mastectomies with sentinel node biopsy 06/29/2011 for a residual right-sided ypT1c ypN0 invasive ductal carcinoma, grade 2, with immediate expander placement  (4) on tamoxifen as of May 2013, discontinued in early 2015 due to breast cancer recurrence   (5) status post left lumpectomy with left-sided sentinel lymph node sampling 05/24/13 for a pT1b pN0, stage IA invasive ductal carcinoma, estrogen receptor 94% positive, progesterone receptor 11% positive, with an MIB-1 of 19% and HER-2 amplification  (6)  Receiving TDM-1 on a Q. three-week basis, first dose on 05/15/2013  PLAN:  The majority of our one-hour appointment today was  spent answering the patient's questions and addressing her concerns, reviewing her treatment plan, and coordinating care.  Jerlene's case was again reviewed with Dr. Jana Hakim. He is pleased with how well she tolerated this treatment. We also again reviewed her scans from September and December 2014, and I explained that per Dr. Jana Hakim a lymph node less than 1.0 cm in diameter is not considered abnormal/pathologic. We also again reviewed the fact that these lymph nodes did not show up on the PET scan in December.    We reviewed Laurice's labs from today, and also from last week on February 5. We reviewed her hormone levels, including her estradiol level which indicates that she is premenopausal, and we again discussed  the fact that she needs to utilize a barrier form of birth control to prevent pregnancy.  I will have Nakyah is CBC checked again last week, primarily to evaluate her mild thrombocytopenia. She'll have her second dose of TDM-1 the following week on February 27, and I will see her again for assessment of toxicities on March 5.  I will mention an echocardiogram was obtained today, although those results are pending. We will repeat echoes on an every 3 month basis in the future.  After 6 months of TDM 1, we will start the patient on anastrozole and she will receive radiation under Dr. Valere Dross.  In the meanwhile, I suggested that she try some Zyrtec-D or Claritin-D for her sinus congestion and runny nose. We also gave her a working thermometer, and she knows to call us with any fevers of 100 or above.   Shamyra  voices both her understanding and agreement with the above plan. She will call with any problems that may develop before her next visit here.      Casia Corti  PA-C    05/22/2013

## 2013-05-22 NOTE — Progress Notes (Signed)
  Echocardiogram 2D Echocardiogram has been performed.  [name] 05/22/2013, 1:09 PM

## 2013-05-23 ENCOUNTER — Ambulatory Visit: Payer: BC Managed Care – PPO

## 2013-05-23 ENCOUNTER — Encounter: Payer: Self-pay | Admitting: Oncology

## 2013-05-23 ENCOUNTER — Other Ambulatory Visit: Payer: BC Managed Care – PPO

## 2013-05-23 NOTE — Telephone Encounter (Signed)
Called pt to inform her of echo results. Pt was pleased with the results. Pt verbalized understanding.No further concerns. Message to be forwarded to [facility].

## 2013-05-28 ENCOUNTER — Other Ambulatory Visit: Payer: Self-pay | Admitting: Physician Assistant

## 2013-05-30 ENCOUNTER — Encounter (INDEPENDENT_AMBULATORY_CARE_PROVIDER_SITE_OTHER): Payer: Self-pay | Admitting: General Surgery

## 2013-05-30 ENCOUNTER — Telehealth: Payer: Self-pay | Admitting: *Deleted

## 2013-05-30 ENCOUNTER — Other Ambulatory Visit (HOSPITAL_BASED_OUTPATIENT_CLINIC_OR_DEPARTMENT_OTHER): Payer: BC Managed Care – PPO

## 2013-05-30 ENCOUNTER — Ambulatory Visit (INDEPENDENT_AMBULATORY_CARE_PROVIDER_SITE_OTHER): Payer: BC Managed Care – PPO | Admitting: General Surgery

## 2013-05-30 ENCOUNTER — Encounter: Payer: Self-pay | Admitting: Oncology

## 2013-05-30 VITALS — BP 122/80 | HR 72 | Temp 97.9°F | Resp 14 | Ht 63.0 in | Wt 126.4 lb

## 2013-05-30 DIAGNOSIS — C50319 Malignant neoplasm of lower-inner quadrant of unspecified female breast: Secondary | ICD-10-CM

## 2013-05-30 DIAGNOSIS — D696 Thrombocytopenia, unspecified: Secondary | ICD-10-CM

## 2013-05-30 DIAGNOSIS — C50311 Malignant neoplasm of lower-inner quadrant of right female breast: Secondary | ICD-10-CM

## 2013-05-30 DIAGNOSIS — C50919 Malignant neoplasm of unspecified site of unspecified female breast: Secondary | ICD-10-CM

## 2013-05-30 LAB — CBC WITH DIFFERENTIAL/PLATELET
BASO%: 0.1 % (ref 0.0–2.0)
Basophils Absolute: 0 10*3/uL (ref 0.0–0.1)
EOS%: 1.3 % (ref 0.0–7.0)
Eosinophils Absolute: 0.1 10*3/uL (ref 0.0–0.5)
HCT: 37.5 % (ref 34.8–46.6)
HGB: 13 g/dL (ref 11.6–15.9)
LYMPH%: 30 % (ref 14.0–49.7)
MCH: 31.2 pg (ref 25.1–34.0)
MCHC: 34.7 g/dL (ref 31.5–36.0)
MCV: 89.9 fL (ref 79.5–101.0)
MONO#: 0.6 10*3/uL (ref 0.1–0.9)
MONO%: 8.9 % (ref 0.0–14.0)
NEUT#: 4.2 10*3/uL (ref 1.5–6.5)
NEUT%: 59.7 % (ref 38.4–76.8)
Platelets: 198 10*3/uL (ref 145–400)
RBC: 4.17 10*6/uL (ref 3.70–5.45)
RDW: 12.9 % (ref 11.2–14.5)
WBC: 7 10*3/uL (ref 3.9–10.3)
lymph#: 2.1 10*3/uL (ref 0.9–3.3)

## 2013-05-30 NOTE — Progress Notes (Signed)
Patient ID: [name], female   DOB: [birth year], 43 y.o.   MRN: [id number] History: This patient underwent excision of recurrent breast cancer right breast, lower inner quadrant with needle as a localization,  as well as insertion of Port-A-Cath on 05/05/2013. We made a significant elliptical incision and had to take the dissection all the way down to the implant capsule. The pectoralis muscle was extraordinarily paper thin. The tumor was invasive ductal carcinoma, grade 2 or 3,  7 mm diameter. Posterior margin was 1 mm. Other margins more widely negative.  Receptor positive, HER-2 positive. I reviewed the pathology report with the patient and gave her a copy. She has already started Herceptin-based chemotherapy with Dr. Jana Hakim. She states this will take several months and then Dr. Valere Dross is going to radiate this area. No significant problems with wound healing.  Exam: Patient looks well. No distress Right breast is examined. The medially placed incision is healing normally. No deformity. No herniation of implant. No infection or fluid. Port site in the left infraclavicular area appears to be healing well.   Assessment       Local recurrence, right breast cancer, 3:00 position. This is a small subcentimeter recurrence interposed between the skin and the implant capsule. There is no evidence of cancer elsewhere. Invasive ductal carcinoma, receptor positive and HER-2 positive.  Recovering uneventfully following wide local excision with radial ellipse.       History of locally advanced cancer right breast(diagnosis Sept., 2012), status post neoadjuvant chemotherapy, subsequent bilateral mastectomy(06/29/2011) and right sentinel node biopsy for invasive ductal carcinoma, receptor positive, HER-2 positive, BRCA negative. Pathologic stage ypT1c, ypN0 with negative margins.       Status post one year of Herceptin chemotherapy       Status post multiple plastic surgical procedures as above, resulting  in excellent cosmetic result.   Plan: Continue adjuvant chemotherapy under the direction of Dr. Jana Hakim Plan subsequent radiation therapy under the guidance of Dr. Valere Dross Return to see me in 6 months, sooner if there are any problems.   [name], M.D., [facility] General and Minimally invasive Surgery Breast and Colorectal Surgery Office:   [phone] Pager:   [phone]

## 2013-05-30 NOTE — Telephone Encounter (Signed)
Called to inform pt of lab results. No answer, but left a detailed message. Told pt if she has any questions, can call back at [phone]. Message to be forwarded to [facility].

## 2013-05-30 NOTE — Progress Notes (Signed)
Checked in patient for lab and had her sign for poss asst with kadcyla.

## 2013-05-30 NOTE — Patient Instructions (Signed)
Your right breast wound is healing normally. There is no sign of infection or complication.  We have reviewed the pathology report, and you now have a copy of this.  Return to see Dr. Dalbert Batman in 6 months.

## 2013-06-02 ENCOUNTER — Ambulatory Visit: Payer: BC Managed Care – PPO | Admitting: Oncology

## 2013-06-06 ENCOUNTER — Ambulatory Visit (HOSPITAL_BASED_OUTPATIENT_CLINIC_OR_DEPARTMENT_OTHER): Payer: BC Managed Care – PPO

## 2013-06-06 ENCOUNTER — Other Ambulatory Visit (HOSPITAL_BASED_OUTPATIENT_CLINIC_OR_DEPARTMENT_OTHER): Payer: BC Managed Care – PPO

## 2013-06-06 ENCOUNTER — Telehealth: Payer: Self-pay | Admitting: *Deleted

## 2013-06-06 ENCOUNTER — Encounter (INDEPENDENT_AMBULATORY_CARE_PROVIDER_SITE_OTHER): Payer: Self-pay

## 2013-06-06 VITALS — BP 109/65 | HR 66 | Temp 96.3°F

## 2013-06-06 DIAGNOSIS — C50919 Malignant neoplasm of unspecified site of unspecified female breast: Secondary | ICD-10-CM

## 2013-06-06 DIAGNOSIS — C50311 Malignant neoplasm of lower-inner quadrant of right female breast: Secondary | ICD-10-CM

## 2013-06-06 DIAGNOSIS — Z5112 Encounter for antineoplastic immunotherapy: Secondary | ICD-10-CM

## 2013-06-06 DIAGNOSIS — D696 Thrombocytopenia, unspecified: Secondary | ICD-10-CM

## 2013-06-06 DIAGNOSIS — C50319 Malignant neoplasm of lower-inner quadrant of unspecified female breast: Secondary | ICD-10-CM

## 2013-06-06 DIAGNOSIS — Z9013 Acquired absence of bilateral breasts and nipples: Secondary | ICD-10-CM

## 2013-06-06 LAB — CBC WITH DIFFERENTIAL/PLATELET
BASO%: 0.4 % (ref 0.0–2.0)
Basophils Absolute: 0 10*3/uL (ref 0.0–0.1)
EOS%: 1.7 % (ref 0.0–7.0)
Eosinophils Absolute: 0.1 10*3/uL (ref 0.0–0.5)
HCT: 37.6 % (ref 34.8–46.6)
HGB: 12.6 g/dL (ref 11.6–15.9)
LYMPH%: 31.9 % (ref 14.0–49.7)
MCH: 31.4 pg (ref 25.1–34.0)
MCHC: 33.6 g/dL (ref 31.5–36.0)
MCV: 93.6 fL (ref 79.5–101.0)
MONO#: 0.4 10*3/uL (ref 0.1–0.9)
MONO%: 6.1 % (ref 0.0–14.0)
NEUT#: 4 10*3/uL (ref 1.5–6.5)
NEUT%: 59.9 % (ref 38.4–76.8)
Platelets: 222 10*3/uL (ref 145–400)
RBC: 4.02 10*6/uL (ref 3.70–5.45)
RDW: 13.1 % (ref 11.2–14.5)
WBC: 6.7 10*3/uL (ref 3.9–10.3)
lymph#: 2.1 10*3/uL (ref 0.9–3.3)

## 2013-06-06 MED ORDER — SODIUM CHLORIDE 0.9 % IJ SOLN
10.0000 mL | INTRAMUSCULAR | Status: DC | PRN
  Administered 2013-06-06: 10 mL
  Filled 2013-06-06: qty 10

## 2013-06-06 MED ORDER — HEPARIN SOD (PORK) LOCK FLUSH 100 UNIT/ML IV SOLN
500.0000 [IU] | Freq: Once | INTRAVENOUS | Status: AC | PRN
  Administered 2013-06-06: 500 [IU]
  Filled 2013-06-06: qty 5

## 2013-06-06 MED ORDER — SODIUM CHLORIDE 0.9 % IV SOLN
3.6000 mg/kg | Freq: Once | INTRAVENOUS | Status: AC
  Administered 2013-06-06: 200 mg via INTRAVENOUS
  Filled 2013-06-06: qty 10

## 2013-06-06 MED ORDER — DIPHENHYDRAMINE HCL 25 MG PO CAPS
50.0000 mg | ORAL_CAPSULE | Freq: Once | ORAL | Status: AC
  Administered 2013-06-06: 50 mg via ORAL

## 2013-06-06 MED ORDER — ACETAMINOPHEN 325 MG PO TABS
650.0000 mg | ORAL_TABLET | Freq: Once | ORAL | Status: AC
  Administered 2013-06-06: 650 mg via ORAL

## 2013-06-06 MED ORDER — ACETAMINOPHEN 325 MG PO TABS
ORAL_TABLET | ORAL | Status: AC
  Filled 2013-06-06: qty 2

## 2013-06-06 MED ORDER — SODIUM CHLORIDE 0.9 % IV SOLN
Freq: Once | INTRAVENOUS | Status: AC
  Administered 2013-06-06: 12:00:00 via INTRAVENOUS

## 2013-06-06 MED ORDER — DIPHENHYDRAMINE HCL 25 MG PO CAPS
ORAL_CAPSULE | ORAL | Status: AC
  Filled 2013-06-06: qty 2

## 2013-06-06 NOTE — Telephone Encounter (Signed)
Called pt to inform her of lab results from today 06/06/13. No answer. Left a detailed message about her results and told her if she has any questions to give me a call at [phone]. Message to be forwarded to [employer], PA-C.

## 2013-06-06 NOTE — Patient Instructions (Signed)
[facility] Discharge Instructions for Patients Receiving Chemotherapy  Today you received the following chemotherapy agents kadcyla   To help prevent nausea and vomiting after your treatment, we encourage you to take your nausea medication as directed  If you develop nausea and vomiting that is not controlled by your nausea medication, call the clinic.   BELOW ARE SYMPTOMS THAT SHOULD BE REPORTED IMMEDIATELY:  *FEVER GREATER THAN 100.5 F  *CHILLS WITH OR WITHOUT FEVER  NAUSEA AND VOMITING THAT IS NOT CONTROLLED WITH YOUR NAUSEA MEDICATION  *UNUSUAL SHORTNESS OF BREATH  *UNUSUAL BRUISING OR BLEEDING  TENDERNESS IN MOUTH AND THROAT WITH OR WITHOUT PRESENCE OF ULCERS  *URINARY PROBLEMS  *BOWEL PROBLEMS  UNUSUAL RASH Items with * indicate a potential emergency and should be followed up as soon as possible.  Feel free to call the clinic you have any questions or concerns. The clinic phone number is [phone].           

## 2013-06-10 ENCOUNTER — Telehealth: Payer: Self-pay | Admitting: Oncology

## 2013-06-10 NOTE — Telephone Encounter (Signed)
Faxed pt medical records to [facility] °

## 2013-06-11 ENCOUNTER — Telehealth: Payer: Self-pay | Admitting: Radiation Oncology

## 2013-06-11 NOTE — Telephone Encounter (Signed)
Forwarded NPE and path to [facility].  Received confirmation.

## 2013-06-12 ENCOUNTER — Telehealth: Payer: Self-pay | Admitting: Oncology

## 2013-06-12 ENCOUNTER — Other Ambulatory Visit (HOSPITAL_BASED_OUTPATIENT_CLINIC_OR_DEPARTMENT_OTHER): Payer: BC Managed Care – PPO

## 2013-06-12 ENCOUNTER — Encounter (INDEPENDENT_AMBULATORY_CARE_PROVIDER_SITE_OTHER): Payer: Self-pay

## 2013-06-12 ENCOUNTER — Ambulatory Visit (HOSPITAL_BASED_OUTPATIENT_CLINIC_OR_DEPARTMENT_OTHER): Payer: BC Managed Care – PPO | Admitting: Physician Assistant

## 2013-06-12 ENCOUNTER — Encounter: Payer: Self-pay | Admitting: Physician Assistant

## 2013-06-12 ENCOUNTER — Encounter: Payer: Self-pay | Admitting: Obstetrics

## 2013-06-12 VITALS — BP 101/67 | HR 76 | Temp 98.3°F | Resp 18 | Ht 63.0 in | Wt 127.8 lb

## 2013-06-12 DIAGNOSIS — Z170 Estrogen receptor positive status [ER+]: Secondary | ICD-10-CM

## 2013-06-12 DIAGNOSIS — C50311 Malignant neoplasm of lower-inner quadrant of right female breast: Secondary | ICD-10-CM

## 2013-06-12 DIAGNOSIS — R110 Nausea: Secondary | ICD-10-CM | POA: Insufficient documentation

## 2013-06-12 DIAGNOSIS — R519 Headache, unspecified: Secondary | ICD-10-CM

## 2013-06-12 DIAGNOSIS — D696 Thrombocytopenia, unspecified: Secondary | ICD-10-CM

## 2013-06-12 DIAGNOSIS — C50319 Malignant neoplasm of lower-inner quadrant of unspecified female breast: Secondary | ICD-10-CM

## 2013-06-12 DIAGNOSIS — Z9013 Acquired absence of bilateral breasts and nipples: Secondary | ICD-10-CM

## 2013-06-12 DIAGNOSIS — R51 Headache: Secondary | ICD-10-CM

## 2013-06-12 DIAGNOSIS — C50919 Malignant neoplasm of unspecified site of unspecified female breast: Secondary | ICD-10-CM

## 2013-06-12 HISTORY — DX: Headache, unspecified: R51.9

## 2013-06-12 LAB — CBC WITH DIFFERENTIAL/PLATELET
BASO%: 0.5 % (ref 0.0–2.0)
Basophils Absolute: 0 10*3/uL (ref 0.0–0.1)
EOS%: 1.5 % (ref 0.0–7.0)
Eosinophils Absolute: 0.1 10*3/uL (ref 0.0–0.5)
HCT: 39.1 % (ref 34.8–46.6)
HGB: 13.1 g/dL (ref 11.6–15.9)
LYMPH%: 27.9 % (ref 14.0–49.7)
MCH: 31 pg (ref 25.1–34.0)
MCHC: 33.4 g/dL (ref 31.5–36.0)
MCV: 92.7 fL (ref 79.5–101.0)
MONO#: 0.7 10*3/uL (ref 0.1–0.9)
MONO%: 10.2 % (ref 0.0–14.0)
NEUT#: 4.4 10*3/uL (ref 1.5–6.5)
NEUT%: 59.9 % (ref 38.4–76.8)
Platelets: 116 10*3/uL — ABNORMAL LOW (ref 145–400)
RBC: 4.22 10*6/uL (ref 3.70–5.45)
RDW: 13.1 % (ref 11.2–14.5)
WBC: 7.3 10*3/uL (ref 3.9–10.3)
lymph#: 2 10*3/uL (ref 0.9–3.3)

## 2013-06-12 LAB — COMPREHENSIVE METABOLIC PANEL (CC13)
ALT: 31 U/L (ref 0–55)
AST: 44 U/L — ABNORMAL HIGH (ref 5–34)
Albumin: 3.8 g/dL (ref 3.5–5.0)
Alkaline Phosphatase: 68 U/L (ref 40–150)
Anion Gap: 7 mEq/L (ref 3–11)
BUN: 12.8 mg/dL (ref 7.0–26.0)
CO2: 29 mEq/L (ref 22–29)
Calcium: 9.5 mg/dL (ref 8.4–10.4)
Chloride: 104 mEq/L (ref 98–109)
Creatinine: 0.8 mg/dL (ref 0.6–1.1)
Glucose: 83 mg/dl (ref 70–140)
Potassium: 4.2 mEq/L (ref 3.5–5.1)
Sodium: 140 mEq/L (ref 136–145)
Total Bilirubin: 0.32 mg/dL (ref 0.20–1.20)
Total Protein: 6.7 g/dL (ref 6.4–8.3)

## 2013-06-12 MED ORDER — TRAMADOL HCL 50 MG PO TABS: 50.0000 mg | ORAL_TABLET | Freq: Four times a day (QID) | ORAL | Status: DC | PRN

## 2013-06-12 NOTE — Telephone Encounter (Signed)
, °

## 2013-06-12 NOTE — Progress Notes (Signed)
ID: [name]   DOB: 1970/12/27  MR#: [id number]  OMV#:[id number]   PCP: Chauncey Cruel, MD GYN: Baltazar Najjar MD SU: Fanny Skates MD OTHER MD: Theodoro Kos, [name]  CHIEF COMPLAINT:  Recurrent Breast Cancer, Right   HISTORY OF PRESENT ILLNESS:  [name] (goes by "[name]") is a [place] woman who, at the age of 43, was referred by Dr. Baltazar Najjar for treatment of right breast carcinoma.   1.  The patient palpated a mass in her right breast which she brought to Dr. Jacelyn Grip attention. A prior screening mammogram on 02/06/2010 had been unremarkable. The patient was then scheduled for bilateral diagnostic mammogram and right breast ultrasonography on 12/22/2010, the studies demonstrating an ill defined mass with heterogeneous calcifications in the inner lower right breast, measuring 1.7 cm. The breasts are heterogeneously dense, but there were no other mammographic abnormalities in either breast. Clinically, there was a palpable firm mass at the 5:00 position of the right breast, 5 cm from the nipple. There were no palpable abnormalities in the right axilla. Ultrasound confirmed a 1.7 cm irregular, hypoechoic mass in the same position. There were some lymph nodes noted in the right exam of, one measuring 1.6 cm with a minimally thickened cortex inferiorly.  An ultrasound-guided core biopsy on 12/23/2010 [phone]) showed an invasive ductal carcinoma, grade 2, ER positive at 88%, PR positive at 11 is %, HER-2/neu positive with a FISH ratio of 4.61, and an MIB-1 of 36%.  The patient was referred to Dr. Fanny Skates and bilateral breast MRIs were obtained 12/30/2010. This showed the mass in question in the right breast to measure 2.0 cm. In the right axilla there was a 1.8 cm lymph node with a thickened cortex. The left breast was unremarkable.  Her subsequent treatment is as detailed below.  2.   [name] then had a restaging PET scan late December 2014 which  was positive only for a small subcutaneous nodule in the right breast lower outer quadrant. There was no other finding of concern. Ultrasound of both breasts 04/08/2013 showed a nodule in the right breast at the 4:00 position measuring 0.7 cm. The right axilla was clear. There was an area at the 7:00 position of the left breast which was of concern by palpation but showed only normal tissue.   Biopsy of the right breast mass 04/09/2013 showed an invasive ductal carcinoma, grade 2, estrogen receptor 94% positive, progesterone receptor 11% positive, with an MIB-1 of 19% and HER-2 amplified with a signals ratio of 3.75 and the number per cell of 4.50. The patient discussed the situation with with her surgeon's and on 05/05/2013 proceeded to right lumpectomy, with the final pathology (S. is a [id number]) confirming an invasive ductal carcinoma, 0.7 cm, grade 2, within a millimeter of the posterior margin, which showed skeletal muscle. A port was placed at the time of that procedure.  Her subsequent treatment is as detailed below.    INTERVAL HISTORY:  43 returns alone today for followup of her recurrent right breast cancer.   She is currently day 7 cycle 2 of every 3 week TDM-1.  Once again, she tolerated the infusion itself well, but she did have several associated side effects when she got home later that day. On the day of infusion she felt "knocked out" and was exhausted. On Saturday she felt "okay" . On Sunday (day 3) however she woke up with "the worst headache ever".  She took Excedrin and applied ice packs  to her head. Eventually by the end of the day the headache improved. It was associated with nausea, but fortunately no emesis. She does have a history of migraine headaches, but notes that this was much worse than any headache she has had. She denies any falls or injuries. She's had no signs of abnormal bleeding. She also mentions that she is having quite a bit of forgetfulness, and has a lot of  difficulty concentrating. She feels like this is new.   Otherwise, she has had some intermittent numbness and tingling in the soles of her feet bilaterally, but this seems to have improved over the last day or 2.   REVIEW OF SYSTEMS:  [name] denies any fevers, chills, or night sweats. She's had no rashes or skin changes. She denies any abnormal bruising. She still has some blood in the mucus when she blows her nose, and she continues to have a runny nose along with some sinus congestion. These issues are all chronic and stable.  She's had no mouth ulcers or oral sensitivity. She's eating reasonably well. She tends to be slightly constipated but manages to have regular bowel movements. She's had no change in urinary habits, no dysuria or hematuria.  She's had no cough, phlegm production, pleurisy, orthopnea, peripheral swelling, chest pain, or palpitations.  She denies any new or unusual myalgias, arthralgias, or bony pain.    A detailed review of systems was otherwise stable.  PAST MEDICAL HISTORY: Past Medical History  Diagnosis Date  . Migraines   . Asthma     as a child  . Breast cancer Sept 2012    right breast  . PONV (postoperative nausea and vomiting)   . Rash     neck  . Anxiety   . Pneumonia   . Kidney stones   . GERD (gastroesophageal reflux disease)     had during chemo  . Cervical dysplasia   1. History of prior adenoidectomy. 2. History of rhinoplasty.   3. History of multiple uterine cervical treatments for precancerous lesions, none for the past 10 years or so. 4. History of fibroid tumor surgery. 5. History of D and C. 6. History of dysplastic nevi removed by Dr. Ronnald Ramp. 7. History of right "lazy eye" corrective surgery. 8. History of approximately 10-pack-years of tobacco abuse, resolved.  9. History of possible migraines (morning headaches). History of asthma as a child.  PAST SURGICAL HISTORY: Past Surgical History  Procedure Laterality Date  . Rhinoplasty     . Adenoidectomy    . Fibroid tumor surgery    . Dilation and curettage of uterus    . Uterine cervical treatments  2002    for precancerous lesions  . Lazy eye      corrective surgery  . Mastectomy w/ sentinel node biopsy  06/29/2011    Procedure: MASTECTOMY WITH SENTINEL LYMPH NODE BIOPSY;  Surgeon: Adin Hector, MD;  Location: [facility];  Service: General;  Laterality: Right;  right skin spairing mstectomy and right sentinel lymph node biopsy  . Breast reconstruction  06/29/2011    Procedure: BREAST RECONSTRUCTION;  Surgeon: Theodoro Kos, DO;  Location: [facility];  Service: Plastics;  Laterality: Bilateral;  Immediate Bilateral Breast Reconstruction with Bilateral Tissue Expanders and placement of  Alloderm   . Port-a-cath removal  03/12/2012    Procedure: MINOR REMOVAL PORT-A-CATH;  Surgeon: Adin Hector, MD;  Location: [facility];  Service: General;  Laterality: N/A;  Upper left  . Breast lumpectomy with needle  localization Right 05/05/2013    Procedure: EXCISION RECURRENT CANCER RIGHT BREAST WITH NEEDLE LOCALOZATION;  Surgeon: Adin Hector, MD;  Location: [facility];  Service: General;  Laterality: Right;  . Portacath placement Left 05/05/2013    Procedure: INSERTION PORT-A-CATH;  Surgeon: Adin Hector, MD;  Location: [facility];  Service: General;  Laterality: Left;    FAMILY HISTORY Family History  Problem Relation Age of Onset  . Adopted: Yes  . Heart attack Father   . Cancer Brother     prostate and lung  . Cancer Cousin     melanoma  . Cancer Sister     breast  . Cancer Sister     stomach, ovarian  :  The patient's biological father died at the age of 6 from myocardial infarction.  The patient's biological mother is alive at age 1.  The patient was adopted by her biological mother's parents.  She has 3 half-brothers.  No sisters.  One cousin on her mother's side died from melanoma at the age of 16.  There is other cancer history in the adopted side but these  are not even half-brothers or sisters, they are children of her adopted parents who are really her grandparents so these would be uncles and aunts (1 lymph node cancer, 1 prostate cancer, 1 breast cancer at the age of 73).  GYNECOLOGIC HISTORY:   (Updated 05/22/2013) She had menarche age 19 or 35.  She is GX, P1, first pregnancy to term at age 28. She stopped having periods approximately March 2013.  Hormone levels in February 2015 show patient to be premenopausal, with an estradiol of 113 on 05/15/2013.  SOCIAL HISTORY: (Updated 06/12/2013) She works in a Engineer, agricultural.  Her husband, Velina Drollinger owns a family business called [employer].  They have been married 5 years.  They have a son, [name], age 31. [name] also has a daughter, [name], who lives in [place] and is 44 years old.  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  The patient is not a church attender.    ADVANCED DIRECTIVES: Not in place  HEALTH MAINTENANCE:  (Updated 06/12/2013) History  Substance Use Topics  . Smoking status: Former Smoker    Quit date: 07/20/1996  . Smokeless tobacco: Never Used  . Alcohol Use: No     Colonoscopy:  Never  PAP: Oct 2014/[facility]  Bone density: Oct 2014, mild osteopenia with a T score of -1.1  Lipid panel: Not on file  Allergies  Allergen Reactions  . Tussionex Pennkinetic Er [Hydrocod Polst-Cpm Polst Er] Other (See Comments)    halucinations  . Codeine Other (See Comments)    unkown  . Morphine Rash  . Morphine And Related Anxiety    anxiety  . Penicillin G Rash  . Penicillins Rash    Current Outpatient Prescriptions  Medication Sig Dispense Refill  . aspirin-acetaminophen-caffeine (EXCEDRIN MIGRAINE) 250-250-65 MG per tablet Take 2 tablets by mouth every 6 (six) hours as needed for headache.      . b complex vitamins tablet Take 1 tablet by mouth daily.      . Biotin 5000 MCG CAPS Take 5,000 mcg by mouth daily.      . Calcium Carbonate-Vitamin D (CALCIUM-VITAMIN D)  500-200 MG-UNIT per tablet Take 1 tablet by mouth 2 (two) times daily with a meal.      . chlorpheniramine (ALLERGY) 4 MG tablet Take by mouth.      . lidocaine-prilocaine (EMLA) cream Apply cream to  port 1-2hours prior to procedure  30 g  2  . LORazepam (ATIVAN) 0.5 MG tablet Take 1 tablet (0.5 mg total) by mouth 2 (two) times daily as needed for anxiety.  60 tablet  0  . Multiple Vitamins-Minerals (HAIR/SKIN/NAILS PO) Take 1 tablet by mouth daily.       . Omega-3 1000 MG CAPS Take 2 g by mouth daily.      . prenatal vitamin w/FE, FA (PRENATAL 1 + 1) 27-1 MG TABS Take 1 tablet by mouth Daily.      . fluconazole (DIFLUCAN) 150 MG tablet       . HYDROcodone-acetaminophen (NORCO/VICODIN) 5-325 MG per tablet       . ibuprofen (ADVIL,MOTRIN) 800 MG tablet       . ondansetron (ZOFRAN) 8 MG tablet Take 1 tablet (8 mg total) by mouth 2 (two) times daily as needed for nausea or vomiting.  20 tablet  1  . prochlorperazine (COMPAZINE) 5 MG tablet Take 1 tablet (5 mg total) by mouth every 6 (six) hours as needed for nausea or vomiting.  30 tablet  0  . traMADol (ULTRAM) 50 MG tablet Take 1 tablet (50 mg total) by mouth every 6 (six) hours as needed for moderate pain.  30 tablet  0   No current facility-administered medications for this visit.    OBJECTIVE: Middle-aged white woman in no acute distress Filed Vitals:   06/12/13 1451  BP: 101/67  Pulse: 76  Temp: 98.3 F (36.8 C)  Resp: 18     Body mass index is 22.64 kg/(m^2).    ECOG FS: 0 Filed Weights   06/12/13 1451  Weight: 127 lb 12.8 oz (57.97 kg)   Physical Exam: HEENT:  Sclerae anicteric. PERRLA  Oropharynx clear and moist. No mucositis. Neck supple, trachea midline. No thyromegaly  NODES:  No cervical or supraclavicular lymphadenopathy palpated.  BREAST EXAM: Deferred. Axillae are benign bilaterally, no palpable lymphadenopathy.  LUNGS:  Clear to auscultation bilaterally with good excursion.  No wheezes or rhonchi HEART:  Regular rate  and rhythm. No murmur  ABDOMEN:  Soft,  thin, with no organomegaly or masses palpated. There is some mild tenderness to palpation in the right lower quadrant, but otherwise nontender.  Positive, normoactive bowel sounds.  MSK:  No focal spinal tenderness to palpation.  good range of motion bilaterally in the upper extremities.  EXTREMITIES:  No peripheral edema.  No lymphedema in the upper extremities. SKIN:  No visible rashes or skin lesions. No excessive ecchymoses. No petechiae. No pallor.  NEURO:  Cranial nerves grossly intact. Nonfocal. Well oriented.  Appropriate  affect.     LAB RESULTS: Lab Results  Component Value Date   WBC 7.3 06/12/2013   NEUTROABS 4.4 06/12/2013   HGB 13.1 06/12/2013   HCT 39.1 06/12/2013   MCV 92.7 06/12/2013   PLT 116* 06/12/2013      Chemistry      Component Value Date/Time   NA 140 06/12/2013 1434   NA 142 05/05/2013 1059   K 4.2 06/12/2013 1434   K 5.0 05/05/2013 1059   CL 103 05/05/2013 1059   CL 104 09/23/2012 1415   CO2 29 06/12/2013 1434   CO2 22 05/05/2013 1059   BUN 12.8 06/12/2013 1434   BUN 11 05/05/2013 1059   CREATININE 0.8 06/12/2013 1434   CREATININE 0.70 05/05/2013 1059   CREATININE 0.77 01/08/2013 1456      Component Value Date/Time   CALCIUM 9.5 06/12/2013 1434  CALCIUM 9.1 05/05/2013 1059   ALKPHOS 68 06/12/2013 1434   ALKPHOS 47 01/08/2013 1456   AST 44* 06/12/2013 1434   AST 18 01/08/2013 1456   ALT 31 06/12/2013 1434   ALT 13 01/08/2013 1456   BILITOT 0.32 06/12/2013 1434   BILITOT 0.5 01/08/2013 1456       STUDIES:   DG Chest Port 1 View 05/05/2013   CLINICAL DATA:  Left chest port placement  EXAM: PORTABLE CHEST - 1 VIEW  COMPARISON:  05/05/2013 at 1206 hr  FINDINGS: Interval placement of a left subclavian chest power port with its tip at/just above the cavoatrial junction.  Lungs are clear. No pleural effusion or pneumothorax.  Heart is normal in size.  Surgical clips in the bilateral chest wall/axilla.  IMPRESSION: Interval placement of a left  subclavian chest power port with its tip at/just above the cavoatrial junction.  No pneumothorax.   Electronically Signed   By: Julian Hy M.D.   On: 05/05/2013 15:01      ASSESSMENT: 43 y.o.  BRCA 1-2 negative [name], [place] woman,   (1) status post right breast biopsy in September 2012 for a clinically 2.0 cm invasive ductal carcinoma, with some enlarged Right axillary lymph nodes, the largest one of which was negative by biopsy. The tumor was grade 2, estrogen receptor positive at 88%, progesterone receptor positive at 11%, HER-2/neu positive by CISH with a ratio of 4.61, and a proliferation marker of 36%.   (2) treated in the neoadjuvant setting with Q 3 week docetaxel/ carboplatin/ trastuzumab x6, completed 05/18/2011.  Continued on trastuzumab every 3 weeks to complete one year, last dose in November 2013.  (3) s/p bilateral mastectomies with sentinel node biopsy 06/29/2011 for a residual right-sided ypT1c ypN0 invasive ductal carcinoma, grade 2, with immediate expander placement  (4) on tamoxifen as of May 2013, discontinued in early 2015 due to breast cancer recurrence   (5) status post left lumpectomy with left-sided sentinel lymph node sampling 05/05/2013 for a pT1b pN0, stage IA invasive ductal carcinoma, estrogen receptor 94% positive, progesterone receptor 11% positive, with an MIB-1 of 19% and HER-2 amplification  (6)  Receiving TDM-1 on a Q. three-week basis, first dose on 05/15/2013  PLAN:  The majority of our 40 minute appointment today was spent answering the patient's questions and addressing her concerns, reviewing her labs, discussing her treatment plan, and coordinating care.  [name] seems to be tolerating this regimen reasonably well, and her next infusion scheduled 2 weeks from now on March 20. In the meanwhile, with her increased frequency and severity of headaches, we are going to obtain a brain MRI for further evaluation.    [name] will be seeing Dr.  Jana Hakim for repeat labs and physical exam as well as assessment of tolerance on March 26. As noted previously, the plan is to continue this regimen for 6 months (through June 2015),  after which we will start the patient on anastrozole and she will also then received radiation to the care of Dr. Valere Dross. In the meanwhile we will continue obtaining at those on a every 3 month basis, with the next being due in mid May.  [name]  voices both her understanding and agreement with the above plan. She will call with any problems that may develop before her next visit here.      Jakirah Zaun  PA-C    06/12/2013

## 2013-06-16 ENCOUNTER — Encounter: Payer: Self-pay | Admitting: Oncology

## 2013-06-16 NOTE — Progress Notes (Signed)
Patient and [employer] left a message. I called patient to advised faxed her app on 06/11/13. And then called [employer] to advise also and faxed again just in case.

## 2013-06-17 ENCOUNTER — Encounter: Payer: Self-pay | Admitting: Oncology

## 2013-06-17 NOTE — Progress Notes (Signed)
Left message for Dr. Virgie Dad nurse to let me know if this patient's cancer is metastatic???  [name] needs to know for possible asst with [facility].

## 2013-06-20 ENCOUNTER — Ambulatory Visit (HOSPITAL_COMMUNITY)
Admission: RE | Admit: 2013-06-20 | Discharge: 2013-06-20 | Disposition: A | Discharge status: Home / Self Care | Payer: BC Managed Care – PPO | Source: Ambulatory Visit | Attending: Physician Assistant | Admitting: Physician Assistant

## 2013-06-20 ENCOUNTER — Encounter: Payer: Self-pay | Admitting: Oncology

## 2013-06-20 DIAGNOSIS — C50919 Malignant neoplasm of unspecified site of unspecified female breast: Secondary | ICD-10-CM

## 2013-06-20 DIAGNOSIS — R42 Dizziness and giddiness: Secondary | ICD-10-CM | POA: Insufficient documentation

## 2013-06-20 DIAGNOSIS — R519 Headache, unspecified: Secondary | ICD-10-CM

## 2013-06-20 DIAGNOSIS — R110 Nausea: Secondary | ICD-10-CM

## 2013-06-20 DIAGNOSIS — R51 Headache: Secondary | ICD-10-CM | POA: Insufficient documentation

## 2013-06-20 DIAGNOSIS — C801 Malignant (primary) neoplasm, unspecified: Secondary | ICD-10-CM | POA: Insufficient documentation

## 2013-06-20 DIAGNOSIS — C50311 Malignant neoplasm of lower-inner quadrant of right female breast: Secondary | ICD-10-CM

## 2013-06-20 MED ORDER — GADOBENATE DIMEGLUMINE 529 MG/ML IV SOLN
10.0000 mL | Freq: Once | INTRAVENOUS | Status: AC | PRN
  Administered 2013-06-20: 10 mL via INTRAVENOUS

## 2013-06-20 NOTE — Progress Notes (Signed)
Called and left message for nurse again. I emailed Dr. Jana Hakim but he is out of office. Need to know if cancer is metastatic for possible asst for [facility]

## 2013-06-23 ENCOUNTER — Other Ambulatory Visit: Payer: Self-pay | Admitting: Physician Assistant

## 2013-06-23 ENCOUNTER — Telehealth: Payer: Self-pay | Admitting: *Deleted

## 2013-06-23 NOTE — Telephone Encounter (Signed)
Called pt to inform her of results from MRI. Pt was pleased with results but is still having headaches. Pt said she is taking Excedrine extra strength. Pt also said she has started what seems to be a menstrual period on March 13th. Pt states she has not had a period in 2 years. Pt did also say she experienced some swelling in hands and fingers a week out from last treatment. Message to be forwarded to [employer], PA-C.

## 2013-06-24 ENCOUNTER — Telehealth: Payer: Self-pay | Admitting: *Deleted

## 2013-06-24 NOTE — Telephone Encounter (Signed)
Called pt back to address concerns from yesterday. Advised pt to take Tylenol, Ibuprofen or Aleve if headaches continue. I told pt that Excedrine could be causing rebound headaches making them worse. Pt agreed to not take as often. Since pt has started menstruating again, I told her to make sure she uses some kind of barrier birth control. Pt verbalized understanding. No further concerns. Message to be forwarded to [employer], PA-C.

## 2013-06-25 ENCOUNTER — Encounter: Payer: Self-pay | Admitting: Oncology

## 2013-06-25 NOTE — Progress Notes (Signed)
Per [employer] patient was denied asst with [facility].

## 2013-06-27 ENCOUNTER — Other Ambulatory Visit: Payer: Self-pay | Admitting: Physician Assistant

## 2013-06-27 ENCOUNTER — Other Ambulatory Visit (HOSPITAL_BASED_OUTPATIENT_CLINIC_OR_DEPARTMENT_OTHER): Payer: BC Managed Care – PPO

## 2013-06-27 ENCOUNTER — Ambulatory Visit (HOSPITAL_BASED_OUTPATIENT_CLINIC_OR_DEPARTMENT_OTHER): Payer: BC Managed Care – PPO

## 2013-06-27 ENCOUNTER — Encounter: Payer: Self-pay | Admitting: Oncology

## 2013-06-27 DIAGNOSIS — Z9013 Acquired absence of bilateral breasts and nipples: Secondary | ICD-10-CM

## 2013-06-27 DIAGNOSIS — D696 Thrombocytopenia, unspecified: Secondary | ICD-10-CM

## 2013-06-27 DIAGNOSIS — C50311 Malignant neoplasm of lower-inner quadrant of right female breast: Secondary | ICD-10-CM

## 2013-06-27 DIAGNOSIS — Z5112 Encounter for antineoplastic immunotherapy: Secondary | ICD-10-CM

## 2013-06-27 DIAGNOSIS — C50919 Malignant neoplasm of unspecified site of unspecified female breast: Secondary | ICD-10-CM

## 2013-06-27 DIAGNOSIS — C50319 Malignant neoplasm of lower-inner quadrant of unspecified female breast: Secondary | ICD-10-CM

## 2013-06-27 DIAGNOSIS — Z170 Estrogen receptor positive status [ER+]: Secondary | ICD-10-CM

## 2013-06-27 LAB — CBC WITH DIFFERENTIAL/PLATELET
BASO%: 0.5 % (ref 0.0–2.0)
Basophils Absolute: 0 10*3/uL (ref 0.0–0.1)
EOS%: 1.4 % (ref 0.0–7.0)
Eosinophils Absolute: 0.1 10*3/uL (ref 0.0–0.5)
HCT: 40.3 % (ref 34.8–46.6)
HGB: 13.3 g/dL (ref 11.6–15.9)
LYMPH%: 26.8 % (ref 14.0–49.7)
MCH: 30.8 pg (ref 25.1–34.0)
MCHC: 33 g/dL (ref 31.5–36.0)
MCV: 93.5 fL (ref 79.5–101.0)
MONO#: 0.4 10*3/uL (ref 0.1–0.9)
MONO%: 6.3 % (ref 0.0–14.0)
NEUT#: 4.3 10*3/uL (ref 1.5–6.5)
NEUT%: 65 % (ref 38.4–76.8)
Platelets: 252 10*3/uL (ref 145–400)
RBC: 4.31 10*6/uL (ref 3.70–5.45)
RDW: 13 % (ref 11.2–14.5)
WBC: 6.5 10*3/uL (ref 3.9–10.3)
lymph#: 1.8 10*3/uL (ref 0.9–3.3)

## 2013-06-27 MED ORDER — HEPARIN SOD (PORK) LOCK FLUSH 100 UNIT/ML IV SOLN
500.0000 [IU] | Freq: Once | INTRAVENOUS | Status: AC | PRN
  Administered 2013-06-27: 500 [IU]
  Filled 2013-06-27: qty 5

## 2013-06-27 MED ORDER — DIPHENHYDRAMINE HCL 25 MG PO CAPS
50.0000 mg | ORAL_CAPSULE | Freq: Once | ORAL | Status: AC
  Administered 2013-06-27: 50 mg via ORAL

## 2013-06-27 MED ORDER — DIPHENHYDRAMINE HCL 25 MG PO CAPS
ORAL_CAPSULE | ORAL | Status: AC
  Filled 2013-06-27: qty 2

## 2013-06-27 MED ORDER — SODIUM CHLORIDE 0.9 % IV SOLN
Freq: Once | INTRAVENOUS | Status: AC
  Administered 2013-06-27: 12:00:00 via INTRAVENOUS

## 2013-06-27 MED ORDER — SODIUM CHLORIDE 0.9 % IJ SOLN
10.0000 mL | INTRAMUSCULAR | Status: DC | PRN
  Administered 2013-06-27: 10 mL
  Filled 2013-06-27: qty 10

## 2013-06-27 MED ORDER — ACETAMINOPHEN 325 MG PO TABS
ORAL_TABLET | ORAL | Status: AC
  Filled 2013-06-27: qty 2

## 2013-06-27 MED ORDER — ACETAMINOPHEN 325 MG PO TABS
650.0000 mg | ORAL_TABLET | Freq: Once | ORAL | Status: AC
  Administered 2013-06-27: 650 mg via ORAL

## 2013-06-27 MED ORDER — ADO-TRASTUZUMAB EMTANSINE CHEMO INJECTION 160 MG
3.6000 mg/kg | Freq: Once | INTRAVENOUS | Status: AC
  Administered 2013-06-27: 200 mg via INTRAVENOUS
  Filled 2013-06-27: qty 10

## 2013-06-27 NOTE — Patient Instructions (Signed)
[facility] Discharge Instructions for Patients Receiving Chemotherapy  Today you received the following chemotherapy agents [facility]cyla  To help prevent nausea and vomiting after your treatment, we encourage you to take your nausea medication as needed   If you develop nausea and vomiting that is not controlled by your nausea medication, call the clinic.   BELOW ARE SYMPTOMS THAT SHOULD BE REPORTED IMMEDIATELY:  *FEVER GREATER THAN 100.5 F  *CHILLS WITH OR WITHOUT FEVER  NAUSEA AND VOMITING THAT IS NOT CONTROLLED WITH YOUR NAUSEA MEDICATION  *UNUSUAL SHORTNESS OF BREATH  *UNUSUAL BRUISING OR BLEEDING  TENDERNESS IN MOUTH AND THROAT WITH OR WITHOUT PRESENCE OF ULCERS  *URINARY PROBLEMS  *BOWEL PROBLEMS  UNUSUAL RASH Items with * indicate a potential emergency and should be followed up as soon as possible.  Feel free to call the clinic you have any questions or concerns. The clinic phone number is [phone].    

## 2013-07-03 ENCOUNTER — Ambulatory Visit (HOSPITAL_BASED_OUTPATIENT_CLINIC_OR_DEPARTMENT_OTHER): Payer: BC Managed Care – PPO | Admitting: Oncology

## 2013-07-03 ENCOUNTER — Other Ambulatory Visit (HOSPITAL_BASED_OUTPATIENT_CLINIC_OR_DEPARTMENT_OTHER): Payer: BC Managed Care – PPO

## 2013-07-03 VITALS — BP 109/72 | HR 64 | Temp 98.7°F | Resp 18 | Ht 63.0 in | Wt 127.7 lb

## 2013-07-03 DIAGNOSIS — C50311 Malignant neoplasm of lower-inner quadrant of right female breast: Secondary | ICD-10-CM

## 2013-07-03 DIAGNOSIS — C50319 Malignant neoplasm of lower-inner quadrant of unspecified female breast: Secondary | ICD-10-CM

## 2013-07-03 DIAGNOSIS — R110 Nausea: Secondary | ICD-10-CM

## 2013-07-03 DIAGNOSIS — R519 Headache, unspecified: Secondary | ICD-10-CM

## 2013-07-03 DIAGNOSIS — D696 Thrombocytopenia, unspecified: Secondary | ICD-10-CM

## 2013-07-03 DIAGNOSIS — C50919 Malignant neoplasm of unspecified site of unspecified female breast: Secondary | ICD-10-CM

## 2013-07-03 DIAGNOSIS — R51 Headache: Secondary | ICD-10-CM

## 2013-07-03 DIAGNOSIS — Z9013 Acquired absence of bilateral breasts and nipples: Secondary | ICD-10-CM

## 2013-07-03 LAB — CBC WITH DIFFERENTIAL/PLATELET
BASO%: 0.2 % (ref 0.0–2.0)
Basophils Absolute: 0 10*3/uL (ref 0.0–0.1)
EOS%: 2 % (ref 0.0–7.0)
Eosinophils Absolute: 0.1 10*3/uL (ref 0.0–0.5)
HCT: 38.4 % (ref 34.8–46.6)
HGB: 13.2 g/dL (ref 11.6–15.9)
LYMPH%: 28.1 % (ref 14.0–49.7)
MCH: 31 pg (ref 25.1–34.0)
MCHC: 34.4 g/dL (ref 31.5–36.0)
MCV: 90.1 fL (ref 79.5–101.0)
MONO#: 0.5 10*3/uL (ref 0.1–0.9)
MONO%: 9.8 % (ref 0.0–14.0)
NEUT#: 3.1 10*3/uL (ref 1.5–6.5)
NEUT%: 59.9 % (ref 38.4–76.8)
Platelets: 83 10*3/uL — ABNORMAL LOW (ref 145–400)
RBC: 4.26 10*6/uL (ref 3.70–5.45)
RDW: 12.6 % (ref 11.2–14.5)
WBC: 5.1 10*3/uL (ref 3.9–10.3)
lymph#: 1.4 10*3/uL (ref 0.9–3.3)

## 2013-07-03 LAB — COMPREHENSIVE METABOLIC PANEL (CC13)
ALT: 61 U/L — ABNORMAL HIGH (ref 0–55)
AST: 72 U/L — ABNORMAL HIGH (ref 5–34)
Albumin: 3.9 g/dL (ref 3.5–5.0)
Alkaline Phosphatase: 83 U/L (ref 40–150)
Anion Gap: 11 mEq/L (ref 3–11)
BUN: 13.3 mg/dL (ref 7.0–26.0)
CO2: 25 mEq/L (ref 22–29)
Calcium: 10.7 mg/dL — ABNORMAL HIGH (ref 8.4–10.4)
Chloride: 105 mEq/L (ref 98–109)
Creatinine: 0.8 mg/dL (ref 0.6–1.1)
Glucose: 94 mg/dl (ref 70–140)
Potassium: 4.2 mEq/L (ref 3.5–5.1)
Sodium: 142 mEq/L (ref 136–145)
Total Bilirubin: 0.45 mg/dL (ref 0.20–1.20)
Total Protein: 6.9 g/dL (ref 6.4–8.3)

## 2013-07-03 MED ORDER — DOXYCYCLINE HYCLATE 100 MG PO TABS: 100.0000 mg | ORAL_TABLET | Freq: Two times a day (BID) | ORAL | Status: DC

## 2013-07-03 NOTE — Progress Notes (Signed)
ID: Mercades F Burnette   DOB: 08/26/1970  MR#: [id number]  CSN#:[id number]   PCP: MAGRINAT,GUSTAV C, MD GYN: Charles Harper MD SU: Haywood Ingram MD OTHER MD: Claire Sanger, Scott Tucker  CHIEF COMPLAINT:  Recurrent Breast Cancer, Right   BREAST CANCER HISTORY:  Quinisha Faye [name] (goes by "Judith Faye") is a [name] woman who, at the age of 43, was referred by Dr. Charles Harper for treatment of right breast carcinoma.   1.  The patient palpated a mass in her right breast which she brought to Dr. Harper's attention. A prior screening mammogram on 02/06/2010 had been unremarkable. The patient was then scheduled for bilateral diagnostic mammogram and right breast ultrasonography on 12/22/2010, the studies demonstrating an ill defined mass with heterogeneous calcifications in the inner lower right breast, measuring 1.7 cm. The breasts are heterogeneously dense, but there were no other mammographic abnormalities in either breast. Clinically, there was a palpable firm mass at the 5:00 position of the right breast, 5 cm from the nipple. There were no palpable abnormalities in the right axilla. Ultrasound confirmed a 1.7 cm irregular, hypoechoic mass in the same position. There were some lymph nodes noted in the right exam of, one measuring 1.6 cm with a minimally thickened cortex inferiorly.  An ultrasound-guided core biopsy on 12/23/2010 ([id number]) showed an invasive ductal carcinoma, grade 2, ER positive at 88%, PR positive at 11 is %, HER-2/neu positive with a FISH ratio of 4.61, and an MIB-1 of 36%.  The patient was referred to Dr. Haywood Ingram and bilateral breast MRIs were obtained 12/30/2010. This showed the mass in question in the right breast to measure 2.0 cm. In the right axilla there was a 1.8 cm lymph node with a thickened cortex. The left breast was unremarkable.  Her subsequent treatment is as detailed below.  2.   Karena then had a restaging PET scan late December 2014 which was  positive only for a small subcutaneous nodule in the right breast lower outer quadrant. There was no other finding of concern. Ultrasound of both breasts 04/08/2013 showed a nodule in the right breast at the 4:00 position measuring 0.7 cm. The right axilla was clear. There was an area at the 7:00 position of the left breast which was of concern by palpation but showed only normal tissue.   Biopsy of the right breast mass 04/09/2013 showed an invasive ductal carcinoma, grade 2, estrogen receptor 94% positive, progesterone receptor 11% positive, with an MIB-1 of 19% and HER-2 amplified with a signals ratio of 3.75 and the number per cell of 4.50. The patient discussed the situation with with her surgeon's and on 05/05/2013 proceeded to right lumpectomy, with the final pathology (S. is a [id number]) confirming an invasive ductal carcinoma, 0.7 cm, grade 2, within a millimeter of the posterior margin, which showed skeletal muscle. A port was placed at the time of that procedure.  Her subsequent treatment is as detailed below.    INTERVAL HISTORY:  [name] returns alone today for followup of her recurrent right breast cancer.   She is currently day 7 cycle 3 of every 3 week TDM-1, with a total of 8 cycles planned..   REVIEW OF SYSTEMS:  Demetrica did much better with cycle 3. She did not have a headache that she had with the first 2 cycles. She also did not have a tingling, swelling, feeling cold or feeling weird problems that she had with the first cycle. She does have a little bit of bloating and   some discomfort in her epigastrium. She complains of blurred vision. She is getting some acne. She feels anxious, but not depressed. Her periods have resumed. A detailed review systems today was otherwise stable  PAST MEDICAL HISTORY: Past Medical History  Diagnosis Date  . Migraines   . Asthma     as a child  . Breast cancer Sept 2012    right breast  . PONV (postoperative nausea and vomiting)   . Rash      neck  . Anxiety   . Pneumonia   . Kidney stones   . GERD (gastroesophageal reflux disease)     had during chemo  . Cervical dysplasia   1. History of prior adenoidectomy. 2. History of rhinoplasty.   3. History of multiple uterine cervical treatments for precancerous lesions, none for the past 10 years or so. 4. History of fibroid tumor surgery. 5. History of D and C. 6. History of dysplastic nevi removed by Dr. Jones. 7. History of right "lazy eye" corrective surgery. 8. History of approximately 10-pack-years of tobacco abuse, resolved.  9. History of possible migraines (morning headaches). History of asthma as a child.  PAST SURGICAL HISTORY: Past Surgical History  Procedure Laterality Date  . Rhinoplasty    . Adenoidectomy    . Fibroid tumor surgery    . Dilation and curettage of uterus    . Uterine cervical treatments  2002    for precancerous lesions  . Lazy eye      corrective surgery  . Mastectomy w/ sentinel node biopsy  06/29/2011    Procedure: MASTECTOMY WITH SENTINEL LYMPH NODE BIOPSY;  Surgeon: Haywood M Ingram, MD;  Location: [facility];  Service: General;  Laterality: Right;  right skin spairing mstectomy and right sentinel lymph node biopsy  . Breast reconstruction  06/29/2011    Procedure: BREAST RECONSTRUCTION;  Surgeon: Claire Sanger, DO;  Location: [facility];  Service: Plastics;  Laterality: Bilateral;  Immediate Bilateral Breast Reconstruction with Bilateral Tissue Expanders and placement of  Alloderm   . Port-a-cath removal  03/12/2012    Procedure: MINOR REMOVAL PORT-A-CATH;  Surgeon: Haywood M Ingram, MD;  Location: [facility];  Service: General;  Laterality: N/A;  Upper left  . Breast lumpectomy with needle localization Right 05/05/2013    Procedure: EXCISION RECURRENT CANCER RIGHT BREAST WITH NEEDLE LOCALOZATION;  Surgeon: Haywood M Ingram, MD;  Location: [facility];  Service: General;  Laterality: Right;  . Portacath placement Left 05/05/2013    Procedure:  INSERTION PORT-A-CATH;  Surgeon: Haywood M Ingram, MD;  Location: [facility];  Service: General;  Laterality: Left;    FAMILY HISTORY Family History  Problem Relation Age of Onset  . Adopted: Yes  . Heart attack Father   . Cancer Brother     prostate and lung  . Cancer Cousin     melanoma  . Cancer Sister     breast  . Cancer Sister     stomach, ovarian  :  The patient's biological father died at the age of 56 from myocardial infarction.  The patient's biological mother is alive at age 61.  The patient was adopted by her biological mother's parents.  She has 3 half-brothers.  No sisters.  One cousin on her mother's side died from melanoma at the age of 42.  There is other cancer history in the adopted side but these are not even half-brothers or sisters, they are children of her adopted parents who are really her grandparents so these   would be uncles and aunts (1 lymph node cancer, 1 prostate cancer, 1 breast cancer at the age of 51).  GYNECOLOGIC HISTORY:   (Updated 05/22/2013) She had menarche age 28 or 78.  She is GX, P1, first pregnancy to term at age 75. She stopped having periods approximately March 2013.  Hormone levels in February 2015 show patient to be premenopausal, with an estradiol of 113 on 05/15/2013, and her periods indeed resumed February of 2015  SOCIAL HISTORY: (Updated 06/12/2013) She works in a Engineer, agricultural.  Her husband, Laterra Lubinski owns a family business called [employer].  They have been married 5 years.  They have a son, Kellie Simmering, age 73. Romilda Joy also has a daughter, Kayslee Furey, who lives in [place] and is 45 years old.  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  The patient is not a church attender.    ADVANCED DIRECTIVES: Not in place  HEALTH MAINTENANCE:  (Updated 06/12/2013) History  Substance Use Topics  . Smoking status: Former Smoker    Quit date: 07/20/1996  . Smokeless tobacco: Never Used  . Alcohol Use: No     Colonoscopy:  Never  PAP: Oct  2014/Harper  Bone density: Oct 2014, mild osteopenia with a T score of -1.1  Lipid panel: Not on file  Allergies  Allergen Reactions  . Tussionex Pennkinetic Er [Hydrocod Polst-Cpm Polst Er] Other (See Comments)    halucinations  . Codeine Other (See Comments)    unkown  . Morphine Rash  . Morphine And Related Anxiety    anxiety  . Penicillin G Rash  . Penicillins Rash    Current Outpatient Prescriptions  Medication Sig Dispense Refill  . aspirin-acetaminophen-caffeine (EXCEDRIN MIGRAINE) 250-250-65 MG per tablet Take 2 tablets by mouth every 6 (six) hours as needed for headache.      . b complex vitamins tablet Take 1 tablet by mouth daily.      . Biotin 5000 MCG CAPS Take 5,000 mcg by mouth daily.      . Calcium Carbonate-Vitamin D (CALCIUM-VITAMIN D) 500-200 MG-UNIT per tablet Take 1 tablet by mouth 2 (two) times daily with a meal.      . chlorpheniramine (ALLERGY) 4 MG tablet Take by mouth.      . fluconazole (DIFLUCAN) 150 MG tablet       . HYDROcodone-acetaminophen (NORCO/VICODIN) 5-325 MG per tablet       . ibuprofen (ADVIL,MOTRIN) 800 MG tablet       . lidocaine-prilocaine (EMLA) cream Apply cream to port 1-2hours prior to procedure  30 g  2  . LORazepam (ATIVAN) 0.5 MG tablet Take 1 tablet (0.5 mg total) by mouth 2 (two) times daily as needed for anxiety.  60 tablet  0  . Multiple Vitamins-Minerals (HAIR/SKIN/NAILS PO) Take 1 tablet by mouth daily.       . Omega-3 1000 MG CAPS Take 2 g by mouth daily.      . ondansetron (ZOFRAN) 8 MG tablet Take 1 tablet (8 mg total) by mouth 2 (two) times daily as needed for nausea or vomiting.  20 tablet  1  . prenatal vitamin w/FE, FA (PRENATAL 1 + 1) 27-1 MG TABS Take 1 tablet by mouth Daily.      . prochlorperazine (COMPAZINE) 5 MG tablet Take 1 tablet (5 mg total) by mouth every 6 (six) hours as needed for nausea or vomiting.  30 tablet  0  . traMADol (ULTRAM) 50 MG tablet Take 1 tablet (50 mg total) by  mouth every 6 (six) hours as  needed for moderate pain.  30 tablet  0   No current facility-administered medications for this visit.    OBJECTIVE: Middle-aged white woman who appears well Filed Vitals:   07/03/13 1113  BP: 109/72  Pulse: 64  Temp: 98.7 F (37.1 C)  Resp: 18     Body mass index is 22.63 kg/(m^2).    ECOG FS: 0 Filed Weights   07/03/13 1113  Weight: 127 lb 11.2 oz (57.924 kg)   Sclerae unicteric, pupils equal and reactive Oropharynx clear and moist-- no thrush No cervical or supraclavicular adenopathy Lungs no rales or rhonchi Heart regular rate and rhythm Abd soft, nontender, positive bowel sounds MSK no focal spinal tenderness, no upper extremity lymphedema Neuro: nonfocal, well oriented, appropriate affect Breasts: The right breast is status post recent biopsy. The incision has healed very nicely. The cosmetic result is excellent. She is of course status post bilateral mastectomies with implant in place. There is no evidence of local recurrence. Both axillae are benign.  LAB RESULTS: Lab Results  Component Value Date   WBC 5.1 07/03/2013   NEUTROABS 3.1 07/03/2013   HGB 13.2 07/03/2013   HCT 38.4 07/03/2013   MCV 90.1 07/03/2013   PLT 83* 07/03/2013      Chemistry      Component Value Date/Time   NA 140 06/12/2013 1434   NA 142 05/05/2013 1059   K 4.2 06/12/2013 1434   K 5.0 05/05/2013 1059   CL 103 05/05/2013 1059   CL 104 09/23/2012 1415   CO2 29 06/12/2013 1434   CO2 22 05/05/2013 1059   BUN 12.8 06/12/2013 1434   BUN 11 05/05/2013 1059   CREATININE 0.8 06/12/2013 1434   CREATININE 0.70 05/05/2013 1059   CREATININE 0.77 01/08/2013 1456      Component Value Date/Time   CALCIUM 9.5 06/12/2013 1434   CALCIUM 9.1 05/05/2013 1059   ALKPHOS 68 06/12/2013 1434   ALKPHOS 47 01/08/2013 1456   AST 44* 06/12/2013 1434   AST 18 01/08/2013 1456   ALT 31 06/12/2013 1434   ALT 13 01/08/2013 1456   BILITOT 0.32 06/12/2013 1434   BILITOT 0.5 01/08/2013 1456       STUDIES:   DG Chest Port 1 View 05/05/2013    CLINICAL DATA:  Left chest port placement  EXAM: PORTABLE CHEST - 1 VIEW  COMPARISON:  05/05/2013 at 1206 hr  FINDINGS: Interval placement of a left subclavian chest power port with its tip at/just above the cavoatrial junction.  Lungs are clear. No pleural effusion or pneumothorax.  Heart is normal in size.  Surgical clips in the bilateral chest wall/axilla.  IMPRESSION: Interval placement of a left subclavian chest power port with its tip at/just above the cavoatrial junction.  No pneumothorax.   Electronically Signed   By: Julian Hy M.D.   On: 05/05/2013 15:01      ASSESSMENT: 43 y.o.  BRCA 1-2 negative [name], [place] woman,   (1) status post right breast biopsy in September 2012 for a clinically 2.0 cm invasive ductal carcinoma, with some enlarged Right axillary lymph nodes, the largest one of which was negative by biopsy. The tumor was grade 2, estrogen receptor positive at 88%, progesterone receptor positive at 11%, HER-2/neu positive by CISH with a ratio of 4.61, and a proliferation marker of 36%.   (2) treated in the neoadjuvant setting with Q 3 week docetaxel/ carboplatin/ trastuzumab x6, completed 05/18/2011.  Continued on trastuzumab every  3 weeks to complete one year, last dose in November 2013.  (3) s/p bilateral mastectomies with sentinel node biopsy 06/29/2011 for a residual right-sided ypT1c ypN0 invasive ductal carcinoma, grade 2, with immediate expander placement  (4) on tamoxifen as of May 2013, discontinued in early 2015 due to breast cancer recurrence   (5) status post left lumpectomy with left-sided sentinel lymph node sampling 05/05/2013 for a pT1b pN0, stage IA invasive ductal carcinoma, estrogen receptor 94% positive, progesterone receptor 11% positive, with an MIB-1 of 19% and HER-2 amplification  (6)  Receiving TDM-1 on a Q. three-week basis, first dose on 05/15/2013, PLAN BEING FOR A TOTAL OF 8 DOSES BEFORE STARTING ANASTROZOLE/ goserelin AND PROCEEDING WITH  RADIATION THERAPY  PLAN:  We spent approximately 45 minutes reviewing her situation. I have extended her chemotherapy orders through the end of June, which will be a total of 8 doses. We discussed how she should take her Benadryl and Tylenol for 2 days only after the upcoming dose. We will continue to check her labwork on treatment days but not of the day so she does not have to lose some much work. I entered her orders for repeat echo in May.  She needs to be TB tested through her job but she is not a candidate for that until she completes not only the current treatment but radiation. I wrote her a note regarding that.  She has resumed her periods. We will add Zoladex when we start her anastrozole at the end of the current treatments, which will be in July. She is going to see me again in may. She is of course using appropriate contraception.  At this point am delighted that Derrel Nip is tolerating her chemotherapy better. I anticipate the remaining 5 cycles we'll go more smoothly. She knows to call for any problems that may develop before the next visit here.  Chauncey Cruel, MD     07/03/2013

## 2013-07-07 ENCOUNTER — Telehealth: Payer: Self-pay | Admitting: *Deleted

## 2013-07-07 ENCOUNTER — Telehealth: Payer: Self-pay | Admitting: Oncology

## 2013-07-07 NOTE — Telephone Encounter (Signed)
Received request from Dr. Jana Hakim to schedule genetics for pt.  Called and confirmed 07/25/13 genetic appt w/ pt.

## 2013-07-07 NOTE — Telephone Encounter (Signed)
added nutr appt for 4/10 and tx for 6/12. echo to [facility] for preatuh. s/w pt she is aware and will get new schedule 4/10. pt aware i will call her back w/echo appt and requests that echo be done same day as f/u 5/18  - ok per desk nurse.

## 2013-07-11 ENCOUNTER — Telehealth: Payer: Self-pay | Admitting: Oncology

## 2013-07-11 NOTE — Telephone Encounter (Signed)
lmonvm for pt re appt for echo 5/18 @ 11am @ [facility]. per pt request echo to be same day as GM 5/18 - ok per desk nurse (3/30). pt to get new schedule when she comes in on 4/10.

## 2013-07-16 ENCOUNTER — Telehealth: Payer: Self-pay | Admitting: *Deleted

## 2013-07-16 ENCOUNTER — Encounter: Payer: Self-pay | Admitting: Oncology

## 2013-07-16 ENCOUNTER — Other Ambulatory Visit: Payer: Self-pay | Admitting: Obstetrics & Gynecology

## 2013-07-16 NOTE — Progress Notes (Signed)
Called and spoke with the patient she has reached out to [employer] for asst with Kadcyla. All others no funds available.

## 2013-07-16 NOTE — Telephone Encounter (Signed)
Patient called concerned because she has had very heavy bleeding this week with her period, which just restarted last month after 2 1/2 years. Informed patient that without checking her labs, we would be unable to predict if treatment is safe for Friday. She will come in and discuss her issues with treatment nurse and if there is any concern of her platelet counts being inadequate, we will make decision at that time.

## 2013-07-17 ENCOUNTER — Other Ambulatory Visit: Payer: Self-pay | Admitting: *Deleted

## 2013-07-17 ENCOUNTER — Other Ambulatory Visit: Payer: Self-pay

## 2013-07-17 DIAGNOSIS — N9489 Other specified conditions associated with female genital organs and menstrual cycle: Secondary | ICD-10-CM

## 2013-07-17 MED ORDER — IBUPROFEN 800 MG PO TABS: 800.0000 mg | ORAL_TABLET | Freq: Three times a day (TID) | ORAL | Status: DC | PRN

## 2013-07-18 ENCOUNTER — Ambulatory Visit (HOSPITAL_BASED_OUTPATIENT_CLINIC_OR_DEPARTMENT_OTHER): Payer: BC Managed Care – PPO

## 2013-07-18 ENCOUNTER — Encounter: Payer: Self-pay | Admitting: Obstetrics

## 2013-07-18 ENCOUNTER — Other Ambulatory Visit (HOSPITAL_BASED_OUTPATIENT_CLINIC_OR_DEPARTMENT_OTHER): Payer: BC Managed Care – PPO

## 2013-07-18 VITALS — BP 106/72 | HR 71 | Temp 97.1°F | Resp 18

## 2013-07-18 DIAGNOSIS — Z9013 Acquired absence of bilateral breasts and nipples: Secondary | ICD-10-CM

## 2013-07-18 DIAGNOSIS — D696 Thrombocytopenia, unspecified: Secondary | ICD-10-CM

## 2013-07-18 DIAGNOSIS — Z5112 Encounter for antineoplastic immunotherapy: Secondary | ICD-10-CM

## 2013-07-18 DIAGNOSIS — C50319 Malignant neoplasm of lower-inner quadrant of unspecified female breast: Secondary | ICD-10-CM

## 2013-07-18 DIAGNOSIS — C50311 Malignant neoplasm of lower-inner quadrant of right female breast: Secondary | ICD-10-CM

## 2013-07-18 DIAGNOSIS — C50919 Malignant neoplasm of unspecified site of unspecified female breast: Secondary | ICD-10-CM

## 2013-07-18 LAB — COMPREHENSIVE METABOLIC PANEL (CC13)
ALT: 23 U/L (ref 0–55)
AST: 34 U/L (ref 5–34)
Albumin: 3.8 g/dL (ref 3.5–5.0)
Alkaline Phosphatase: 62 U/L (ref 40–150)
Anion Gap: 8 mEq/L (ref 3–11)
BUN: 10.3 mg/dL (ref 7.0–26.0)
CO2: 26 mEq/L (ref 22–29)
Calcium: 9.3 mg/dL (ref 8.4–10.4)
Chloride: 106 mEq/L (ref 98–109)
Creatinine: 0.8 mg/dL (ref 0.6–1.1)
Glucose: 84 mg/dl (ref 70–140)
Potassium: 3.9 mEq/L (ref 3.5–5.1)
Sodium: 140 mEq/L (ref 136–145)
Total Bilirubin: 0.34 mg/dL (ref 0.20–1.20)
Total Protein: 6.4 g/dL (ref 6.4–8.3)

## 2013-07-18 LAB — CBC WITH DIFFERENTIAL/PLATELET
BASO%: 0.3 % (ref 0.0–2.0)
Basophils Absolute: 0 10*3/uL (ref 0.0–0.1)
EOS%: 1.9 % (ref 0.0–7.0)
Eosinophils Absolute: 0.1 10*3/uL (ref 0.0–0.5)
HCT: 35.5 % (ref 34.8–46.6)
HGB: 12.2 g/dL (ref 11.6–15.9)
LYMPH%: 31.8 % (ref 14.0–49.7)
MCH: 31.2 pg (ref 25.1–34.0)
MCHC: 34.4 g/dL (ref 31.5–36.0)
MCV: 90.8 fL (ref 79.5–101.0)
MONO#: 0.4 10*3/uL (ref 0.1–0.9)
MONO%: 6.1 % (ref 0.0–14.0)
NEUT#: 3.8 10*3/uL (ref 1.5–6.5)
NEUT%: 59.9 % (ref 38.4–76.8)
Platelets: 213 10*3/uL (ref 145–400)
RBC: 3.91 10*6/uL (ref 3.70–5.45)
RDW: 12.6 % (ref 11.2–14.5)
WBC: 6.4 10*3/uL (ref 3.9–10.3)
lymph#: 2 10*3/uL (ref 0.9–3.3)

## 2013-07-18 MED ORDER — DIPHENHYDRAMINE HCL 25 MG PO CAPS
ORAL_CAPSULE | ORAL | Status: AC
  Filled 2013-07-18: qty 2

## 2013-07-18 MED ORDER — DIPHENHYDRAMINE HCL 25 MG PO CAPS
50.0000 mg | ORAL_CAPSULE | Freq: Once | ORAL | Status: AC
  Administered 2013-07-18: 50 mg via ORAL

## 2013-07-18 MED ORDER — ACETAMINOPHEN 325 MG PO TABS
ORAL_TABLET | ORAL | Status: AC
  Filled 2013-07-18: qty 2

## 2013-07-18 MED ORDER — SODIUM CHLORIDE 0.9 % IV SOLN
3.6000 mg/kg | Freq: Once | INTRAVENOUS | Status: AC
  Administered 2013-07-18: 200 mg via INTRAVENOUS
  Filled 2013-07-18: qty 10

## 2013-07-18 MED ORDER — HEPARIN SOD (PORK) LOCK FLUSH 100 UNIT/ML IV SOLN
500.0000 [IU] | Freq: Once | INTRAVENOUS | Status: AC | PRN
  Administered 2013-07-18: 500 [IU]
  Filled 2013-07-18: qty 5

## 2013-07-18 MED ORDER — SODIUM CHLORIDE 0.9 % IV SOLN
Freq: Once | INTRAVENOUS | Status: AC
  Administered 2013-07-18: 13:00:00 via INTRAVENOUS

## 2013-07-18 MED ORDER — ACETAMINOPHEN 325 MG PO TABS
650.0000 mg | ORAL_TABLET | Freq: Once | ORAL | Status: AC
  Administered 2013-07-18: 650 mg via ORAL

## 2013-07-18 MED ORDER — SODIUM CHLORIDE 0.9 % IJ SOLN
10.0000 mL | INTRAMUSCULAR | Status: DC | PRN
  Administered 2013-07-18: 10 mL
  Filled 2013-07-18: qty 10

## 2013-07-18 NOTE — Patient Instructions (Signed)
[facility] Discharge Instructions for Patients Receiving Chemotherapy  Today you received the following chemotherapy agents Kadcyla.  To help prevent nausea and vomiting after your treatment, we encourage you to take your nausea medication.   If you develop nausea and vomiting that is not controlled by your nausea medication, call the clinic.   BELOW ARE SYMPTOMS THAT SHOULD BE REPORTED IMMEDIATELY:  *FEVER GREATER THAN 100.5 F  *CHILLS WITH OR WITHOUT FEVER  NAUSEA AND VOMITING THAT IS NOT CONTROLLED WITH YOUR NAUSEA MEDICATION  *UNUSUAL SHORTNESS OF BREATH  *UNUSUAL BRUISING OR BLEEDING  TENDERNESS IN MOUTH AND THROAT WITH OR WITHOUT PRESENCE OF ULCERS  *URINARY PROBLEMS  *BOWEL PROBLEMS  UNUSUAL RASH Items with * indicate a potential emergency and should be followed up as soon as possible.  Feel free to call the clinic you have any questions or concerns. The clinic phone number is [phone].    

## 2013-07-18 NOTE — Progress Notes (Signed)
1220-Pt states that she is still having heavy bleeding with menses that started on Monday.  CBC results WNL.  Pt is also concerned regarding her elevated liver enzymes.  Dr. Jana Hakim notified and order received to repeat CMET today and to instruct pt to call her GYN MD.  Pt states that she is not taking any additional herbs/supplements at home.  Pt has no further questions at this time.

## 2013-07-23 NOTE — Telephone Encounter (Signed)
Rx refilled.

## 2013-07-23 NOTE — Telephone Encounter (Signed)
Already refilled per Dr Jodi Mourning

## 2013-07-25 ENCOUNTER — Other Ambulatory Visit: Payer: BC Managed Care – PPO

## 2013-07-25 ENCOUNTER — Ambulatory Visit (HOSPITAL_BASED_OUTPATIENT_CLINIC_OR_DEPARTMENT_OTHER): Payer: BC Managed Care – PPO | Admitting: Genetic Counselor

## 2013-07-25 DIAGNOSIS — IMO0002 Reserved for concepts with insufficient information to code with codable children: Secondary | ICD-10-CM

## 2013-07-25 DIAGNOSIS — Z800 Family history of malignant neoplasm of digestive organs: Secondary | ICD-10-CM | POA: Insufficient documentation

## 2013-07-25 DIAGNOSIS — C50311 Malignant neoplasm of lower-inner quadrant of right female breast: Secondary | ICD-10-CM

## 2013-07-25 DIAGNOSIS — Z8041 Family history of malignant neoplasm of ovary: Secondary | ICD-10-CM | POA: Insufficient documentation

## 2013-07-25 DIAGNOSIS — C50919 Malignant neoplasm of unspecified site of unspecified female breast: Secondary | ICD-10-CM | POA: Insufficient documentation

## 2013-07-25 DIAGNOSIS — Z803 Family history of malignant neoplasm of breast: Secondary | ICD-10-CM | POA: Insufficient documentation

## 2013-07-25 DIAGNOSIS — C50319 Malignant neoplasm of lower-inner quadrant of unspecified female breast: Secondary | ICD-10-CM

## 2013-07-25 NOTE — Progress Notes (Signed)
HISTORY OF PRESENT ILLNESS: Dr. Jana Hakim requested a cancer genetics consultation for [name], a 43 y.o. female, due to a personal and family history of cancer.  [name] presents to clinic today to discuss the possibility of a hereditary predisposition to cancer, genetic testing, and to further clarify her future cancer risks, as well as potential cancer risk for family members.    Past Medical History  Diagnosis Date   Migraines    Asthma     as a child   Breast cancer Sept 2012    right breast   PONV (postoperative nausea and vomiting)    Rash     neck   Anxiety    Pneumonia    Kidney stones    GERD (gastroesophageal reflux disease)     had during chemo   Cervical dysplasia     Past Surgical History  Procedure Laterality Date   Rhinoplasty     Adenoidectomy     Fibroid tumor surgery     Dilation and curettage of uterus     Uterine cervical treatments  2002    for precancerous lesions   Lazy eye      corrective surgery   Mastectomy w/ sentinel node biopsy  06/29/2011    Procedure: MASTECTOMY WITH SENTINEL LYMPH NODE BIOPSY;  Surgeon: Adin Hector, MD;  Location: [facility];  Service: General;  Laterality: Right;  right skin spairing mstectomy and right sentinel lymph node biopsy   Breast reconstruction  06/29/2011    Procedure: BREAST RECONSTRUCTION;  Surgeon: Theodoro Kos, DO;  Location: [facility];  Service: Plastics;  Laterality: Bilateral;  Immediate Bilateral Breast Reconstruction with Bilateral Tissue Expanders and placement of  Alloderm    Port-a-cath removal  03/12/2012    Procedure: MINOR REMOVAL PORT-A-CATH;  Surgeon: Adin Hector, MD;  Location: [facility];  Service: General;  Laterality: N/A;  Upper left   Breast lumpectomy with needle localization Right 05/05/2013    Procedure: EXCISION RECURRENT CANCER RIGHT BREAST WITH NEEDLE LOCALOZATION;  Surgeon: Adin Hector, MD;  Location: [facility];  Service: General;  Laterality:  Right;   Portacath placement Left 05/05/2013    Procedure: INSERTION PORT-A-CATH;  Surgeon: Adin Hector, MD;  Location: [facility];  Service: General;  Laterality: Left;   History   Social History   Marital Status: Married    Spouse Name: N/A    Number of Children: N/A   Years of Education: N/A   Social History Main Topics   Smoking status: Former Smoker    Quit date: 07/20/1996   Smokeless tobacco: Never Used   Alcohol Use: No   Drug Use: No   Sexual Activity: Not on file   Other Topics Concern   Not on file   Social History Narrative   She was adopted by her grandparents as a child.  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  She works in a Engineer, agricultural.     FAMILY HISTORY:  During the visit, a 4-generation pedigree was obtained. Significant diagnoses include the following:  Family History  Problem Relation Age of Onset   Adopted: Yes   Heart attack Father    Cancer Cousin 89    melanoma; mat first cousin, located on neck   Cancer Maternal Aunt 60    stomach and ovarian as separate primaries   Cancer Maternal Uncle 65    lung; smoker   Cancer Maternal Grandmother 57  enodometrial   Cancer Maternal Aunt 57    breast   Cancer Maternal Uncle 22    prostate    [name]'s ancestry is of Caucasian descent. There is no known Jewish ancestry or consanguinity.  GENETIC COUNSELING ASSESSMENT: [name] is a 43 y.o. female with a personal and family history of cancer suggestive of a hereditary predisposition to cancer. We, therefore, discussed and recommended the following at today's visit.   DISCUSSION: We reviewed the characteristics, features and inheritance patterns of hereditary cancer syndromes. We also discussed genetic testing, including the appropriate family members to test, the process of testing, insurance coverage and turn-around-time for results. We discussed the implications of a negative, positive and/or  variant of uncertain significant result. We recommended [name] pursue genetic testing for the OvaNext gene panel at [facility].   PLAN: Based on our above recommendation, [name] wished to pursue genetic testing and the blood sample was drawn and will be sent to [facility] for analysis of the OvaNext gene panel. Results should be available within approximately 4 weeks time, at which point they will be disclosed by telephone to [name], as will any additional recommendations warranted by these results. We also encouraged [name] to remain in contact with cancer genetics annually so that we can continuously update the family history and inform her of any changes in cancer genetics and testing that may be of benefit for this family. Ms.  [name] questions were answered to her satisfaction today. Our contact information was provided should additional questions or concerns arise.   Thank you for the referral and allowing [place] to share in the care of your patient.   The patient was seen for a total of 40 minutes, greater than 50% of which was spent face-to-face counseling.  This patient was discussed with Dr. Jana Hakim who agrees with the above.    _______________________________________________________________________ For Office Staff:  Number of people involved in session: 2 Was an Intern/ student involved with case: no

## 2013-08-08 ENCOUNTER — Ambulatory Visit (HOSPITAL_BASED_OUTPATIENT_CLINIC_OR_DEPARTMENT_OTHER): Payer: BC Managed Care – PPO

## 2013-08-08 ENCOUNTER — Other Ambulatory Visit (HOSPITAL_BASED_OUTPATIENT_CLINIC_OR_DEPARTMENT_OTHER): Payer: BC Managed Care – PPO

## 2013-08-08 VITALS — BP 99/72 | HR 70 | Temp 98.3°F | Resp 18

## 2013-08-08 DIAGNOSIS — Z9013 Acquired absence of bilateral breasts and nipples: Secondary | ICD-10-CM

## 2013-08-08 DIAGNOSIS — C50319 Malignant neoplasm of lower-inner quadrant of unspecified female breast: Secondary | ICD-10-CM

## 2013-08-08 DIAGNOSIS — C50919 Malignant neoplasm of unspecified site of unspecified female breast: Secondary | ICD-10-CM

## 2013-08-08 DIAGNOSIS — D696 Thrombocytopenia, unspecified: Secondary | ICD-10-CM

## 2013-08-08 DIAGNOSIS — Z5112 Encounter for antineoplastic immunotherapy: Secondary | ICD-10-CM

## 2013-08-08 DIAGNOSIS — C50311 Malignant neoplasm of lower-inner quadrant of right female breast: Secondary | ICD-10-CM

## 2013-08-08 LAB — CBC WITH DIFFERENTIAL/PLATELET
BASO%: 0.6 % (ref 0.0–2.0)
Basophils Absolute: 0 10*3/uL (ref 0.0–0.1)
EOS%: 1.9 % (ref 0.0–7.0)
Eosinophils Absolute: 0.1 10*3/uL (ref 0.0–0.5)
HCT: 36.2 % (ref 34.8–46.6)
HGB: 12 g/dL (ref 11.6–15.9)
LYMPH%: 27.2 % (ref 14.0–49.7)
MCH: 30.6 pg (ref 25.1–34.0)
MCHC: 33.3 g/dL (ref 31.5–36.0)
MCV: 92 fL (ref 79.5–101.0)
MONO#: 0.5 10*3/uL (ref 0.1–0.9)
MONO%: 6.9 % (ref 0.0–14.0)
NEUT#: 4.4 10*3/uL (ref 1.5–6.5)
NEUT%: 63.4 % (ref 38.4–76.8)
Platelets: 227 10*3/uL (ref 145–400)
RBC: 3.93 10*6/uL (ref 3.70–5.45)
RDW: 12.9 % (ref 11.2–14.5)
WBC: 6.9 10*3/uL (ref 3.9–10.3)
lymph#: 1.9 10*3/uL (ref 0.9–3.3)

## 2013-08-08 LAB — COMPREHENSIVE METABOLIC PANEL (CC13)
ALT: 50 U/L (ref 0–55)
AST: 53 U/L — ABNORMAL HIGH (ref 5–34)
Albumin: 3.8 g/dL (ref 3.5–5.0)
Alkaline Phosphatase: 64 U/L (ref 40–150)
Anion Gap: 9 mEq/L (ref 3–11)
BUN: 13.8 mg/dL (ref 7.0–26.0)
CO2: 27 mEq/L (ref 22–29)
Calcium: 10.1 mg/dL (ref 8.4–10.4)
Chloride: 107 mEq/L (ref 98–109)
Creatinine: 0.9 mg/dL (ref 0.6–1.1)
Glucose: 92 mg/dl (ref 70–140)
Potassium: 4.8 mEq/L (ref 3.5–5.1)
Sodium: 142 mEq/L (ref 136–145)
Total Bilirubin: 0.32 mg/dL (ref 0.20–1.20)
Total Protein: 6.4 g/dL (ref 6.4–8.3)

## 2013-08-08 MED ORDER — DIPHENHYDRAMINE HCL 25 MG PO CAPS
50.0000 mg | ORAL_CAPSULE | Freq: Once | ORAL | Status: AC
  Administered 2013-08-08: 50 mg via ORAL

## 2013-08-08 MED ORDER — ADO-TRASTUZUMAB EMTANSINE CHEMO INJECTION 160 MG
3.6000 mg/kg | Freq: Once | INTRAVENOUS | Status: AC
  Administered 2013-08-08: 200 mg via INTRAVENOUS
  Filled 2013-08-08: qty 10

## 2013-08-08 MED ORDER — SODIUM CHLORIDE 0.9 % IV SOLN
Freq: Once | INTRAVENOUS | Status: AC
  Administered 2013-08-08: 13:00:00 via INTRAVENOUS

## 2013-08-08 MED ORDER — ACETAMINOPHEN 325 MG PO TABS
ORAL_TABLET | ORAL | Status: AC
  Filled 2013-08-08: qty 2

## 2013-08-08 MED ORDER — HEPARIN SOD (PORK) LOCK FLUSH 100 UNIT/ML IV SOLN
500.0000 [IU] | Freq: Once | INTRAVENOUS | Status: AC | PRN
  Administered 2013-08-08: 500 [IU]
  Filled 2013-08-08: qty 5

## 2013-08-08 MED ORDER — SODIUM CHLORIDE 0.9 % IJ SOLN
10.0000 mL | INTRAMUSCULAR | Status: DC | PRN
  Administered 2013-08-08: 10 mL
  Filled 2013-08-08: qty 10

## 2013-08-08 MED ORDER — ACETAMINOPHEN 325 MG PO TABS
650.0000 mg | ORAL_TABLET | Freq: Once | ORAL | Status: AC
  Administered 2013-08-08: 650 mg via ORAL

## 2013-08-08 MED ORDER — DIPHENHYDRAMINE HCL 25 MG PO CAPS
ORAL_CAPSULE | ORAL | Status: AC
  Filled 2013-08-08: qty 2

## 2013-08-08 NOTE — Patient Instructions (Addendum)
[facility] Discharge Instructions for Patients Receiving Chemotherapy  Today you received the following treatment:   Kadcyla To help prevent nausea and vomiting after your treatment, we encourage you to take your nausea medication as needed If you develop nausea and vomiting that is not controlled by your nausea medication, call the clinic.   BELOW ARE SYMPTOMS THAT SHOULD BE REPORTED IMMEDIATELY:  *FEVER GREATER THAN 100.5 F  *CHILLS WITH OR WITHOUT FEVER  NAUSEA AND VOMITING THAT IS NOT CONTROLLED WITH YOUR NAUSEA MEDICATION  *UNUSUAL SHORTNESS OF BREATH  *UNUSUAL BRUISING OR BLEEDING  TENDERNESS IN MOUTH AND THROAT WITH OR WITHOUT PRESENCE OF ULCERS  *URINARY PROBLEMS  *BOWEL PROBLEMS  UNUSUAL RASH Items with * indicate a potential emergency and should be followed up as soon as possible.  Feel free to call the clinic should you have any questions or concerns. The clinic phone number is [phone].

## 2013-08-11 ENCOUNTER — Other Ambulatory Visit: Payer: BC Managed Care – PPO

## 2013-08-14 ENCOUNTER — Telehealth: Payer: Self-pay | Admitting: *Deleted

## 2013-08-14 NOTE — Telephone Encounter (Signed)
Pt left message stating she woke up with " sore throat and congestion ".  She took an OTC medication " but then read on the label concern relating to using this medication while on the Kadcyla "  Call returned and obtained identified VM- Per message informed pt no know interaction of OTC medications and Kadcyla.  [name] left a second message stating per the label the medication of concern was the ingredient in muccinex, " the dexathe...." ( she could not pronounce the full name ).  This RN did a review of drug interactions with Kadcyla and muccinex, nor dextromethorphan had any known interactions.  Call returned to pt and again received identified VM- detailed message left per above with request to return call if needed for additional discussion relating to her concerns.

## 2013-08-25 ENCOUNTER — Ambulatory Visit (HOSPITAL_COMMUNITY)
Admission: RE | Admit: 2013-08-25 | Discharge: 2013-08-25 | Disposition: A | Discharge status: Home / Self Care | Payer: BC Managed Care – PPO | Source: Ambulatory Visit | Attending: Oncology | Admitting: Oncology

## 2013-08-25 ENCOUNTER — Ambulatory Visit (HOSPITAL_BASED_OUTPATIENT_CLINIC_OR_DEPARTMENT_OTHER): Payer: BC Managed Care – PPO | Admitting: Oncology

## 2013-08-25 ENCOUNTER — Ambulatory Visit (HOSPITAL_BASED_OUTPATIENT_CLINIC_OR_DEPARTMENT_OTHER): Payer: BC Managed Care – PPO

## 2013-08-25 VITALS — BP 103/69 | HR 80 | Temp 98.1°F | Resp 18 | Ht 63.0 in | Wt 126.3 lb

## 2013-08-25 DIAGNOSIS — Z5111 Encounter for antineoplastic chemotherapy: Secondary | ICD-10-CM

## 2013-08-25 DIAGNOSIS — R7401 Elevation of levels of liver transaminase levels: Secondary | ICD-10-CM

## 2013-08-25 DIAGNOSIS — Z9010 Acquired absence of unspecified breast and nipple: Secondary | ICD-10-CM

## 2013-08-25 DIAGNOSIS — Z170 Estrogen receptor positive status [ER+]: Secondary | ICD-10-CM

## 2013-08-25 DIAGNOSIS — C50311 Malignant neoplasm of lower-inner quadrant of right female breast: Secondary | ICD-10-CM

## 2013-08-25 DIAGNOSIS — R51 Headache: Secondary | ICD-10-CM

## 2013-08-25 DIAGNOSIS — Z853 Personal history of malignant neoplasm of breast: Secondary | ICD-10-CM

## 2013-08-25 DIAGNOSIS — R740 Nonspecific elevation of levels of transaminase and lactic acid dehydrogenase [LDH]: Secondary | ICD-10-CM

## 2013-08-25 DIAGNOSIS — C50319 Malignant neoplasm of lower-inner quadrant of unspecified female breast: Secondary | ICD-10-CM

## 2013-08-25 DIAGNOSIS — C50919 Malignant neoplasm of unspecified site of unspecified female breast: Secondary | ICD-10-CM

## 2013-08-25 DIAGNOSIS — Z79899 Other long term (current) drug therapy: Secondary | ICD-10-CM | POA: Insufficient documentation

## 2013-08-25 DIAGNOSIS — R7402 Elevation of levels of lactic acid dehydrogenase (LDH): Secondary | ICD-10-CM

## 2013-08-25 DIAGNOSIS — Z9013 Acquired absence of bilateral breasts and nipples: Secondary | ICD-10-CM

## 2013-08-25 LAB — URINALYSIS, MICROSCOPIC - CHCC
Bilirubin (Urine): NEGATIVE
Blood: NEGATIVE
Glucose: NEGATIVE mg/dL
Ketones: NEGATIVE mg/dL
Leukocyte Esterase: NEGATIVE
Nitrite: NEGATIVE
Protein: NEGATIVE mg/dL
RBC / HPF: NEGATIVE (ref 0–2)
Specific Gravity, Urine: 1.01 (ref 1.003–1.035)
Urobilinogen, UR: 0.2 mg/dL (ref 0.2–1)
pH: 6.5 (ref 4.6–8.0)

## 2013-08-25 MED ORDER — SUMATRIPTAN SUCCINATE 50 MG PO TABS: 50.0000 mg | ORAL_TABLET | ORAL | Status: DC | PRN

## 2013-08-25 NOTE — Progress Notes (Signed)
Echocardiogram 2D Echocardiogram has been performed.  [name] [name] 08/25/2013, 12:23 PM

## 2013-08-25 NOTE — Progress Notes (Signed)
ID: [name]   DOB: 06/21/1970  MR#: [id number]  SHF#:[id number]   PCP: Chauncey Cruel, MD GYN: Baltazar Najjar MD SU: Fanny Skates MD OTHER MD: Theodoro Kos, [name]  CHIEF COMPLAINT:  Recurrent Breast Cancer, Right   BREAST CANCER HISTORY:  [name] (goes by "[name]") is a [place] woman who, at the age of 43, was referred by Dr. Baltazar Najjar for treatment of right breast carcinoma.   1.  The patient palpated a mass in her right breast which she brought to Dr. Jacelyn Grip attention. A prior screening mammogram on 02/06/2010 had been unremarkable. The patient was then scheduled for bilateral diagnostic mammogram and right breast ultrasonography on 12/22/2010, the studies demonstrating an ill defined mass with heterogeneous calcifications in the inner lower right breast, measuring 1.7 cm. The breasts are heterogeneously dense, but there were no other mammographic abnormalities in either breast. Clinically, there was a palpable firm mass at the 5:00 position of the right breast, 5 cm from the nipple. There were no palpable abnormalities in the right axilla. Ultrasound confirmed a 1.7 cm irregular, hypoechoic mass in the same position. There were some lymph nodes noted in the right exam of, one measuring 1.6 cm with a minimally thickened cortex inferiorly.  An ultrasound-guided core biopsy on 12/23/2010 [phone]) showed an invasive ductal carcinoma, grade 2, ER positive at 88%, PR positive at 11 is %, HER-2/neu positive with a FISH ratio of 4.61, and an MIB-1 of 36%.  The patient was referred to Dr. Fanny Skates and bilateral breast MRIs were obtained 12/30/2010. This showed the mass in question in the right breast to measure 2.0 cm. In the right axilla there was a 1.8 cm lymph node with a thickened cortex. The left breast was unremarkable.  Her subsequent treatment is as detailed below.  2.   [name] then had a restaging PET scan late December 2014 which was  positive only for a small subcutaneous nodule in the right breast lower outer quadrant. There was no other finding of concern. Ultrasound of both breasts 04/08/2013 showed a nodule in the right breast at the 4:00 position measuring 0.7 cm. The right axilla was clear. There was an area at the 7:00 position of the left breast which was of concern by palpation but showed only normal tissue.   Biopsy of the right breast mass 04/09/2013 showed an invasive ductal carcinoma, grade 2, estrogen receptor 94% positive, progesterone receptor 11% positive, with an MIB-1 of 19% and HER-2 amplified with a signals ratio of 3.75 and the number per cell of 4.50. The patient discussed the situation with with her surgeon's and on 05/05/2013 proceeded to right lumpectomy, with the final pathology (S. is a [id number]) confirming an invasive ductal carcinoma, 0.7 cm, grade 2, within a millimeter of the posterior margin, which showed skeletal muscle. A port was placed at the time of that procedure.  Her subsequent treatment is as detailed below.  INTERVAL HISTORY:  [name] returns today for followup of her recurrent right breast cancer accompanied by her husband [name].  She is currently day 18 cycle 5 of 8 planned cycles of  TDM-1, given every 21 days.   REVIEW OF SYSTEMS:  [name] is tolerating her treatment well. She denies unusual fatigue, fever, rash, or weight loss. Her biggest problem is headaches. Sometimes she wakes up with them. Sometimes the last half a day and are accompanied by nausea and vomiting. Some headaches aren't nuchal, some are bitemporal. Sometimes she has blurred vision.  She has a runny nose and minimal epistaxis when she blows her nose. She has a nasty metal taste "like I have pennies on my tongue". For the last couple of days her stools have been a little yellow or than she normally sees them. Her back pain is stable. She feels forgetful and anxious but not depressed. Hot flashes are a little worse and her last  period was very faint. Aside from this a detailed review of systems today was stable  PAST MEDICAL HISTORY: Past Medical History  Diagnosis Date  . Migraines   . Asthma     as a child  . Breast cancer Sept 2012    right breast  . PONV (postoperative nausea and vomiting)   . Rash     neck  . Anxiety   . Pneumonia   . Kidney stones   . GERD (gastroesophageal reflux disease)     had during chemo  . Cervical dysplasia   1. History of prior adenoidectomy. 2. History of rhinoplasty.   3. History of multiple uterine cervical treatments for precancerous lesions, none for the past 10 years or so. 4. History of fibroid tumor surgery. 5. History of D and C. 6. History of dysplastic nevi removed by Dr. Ronnald Ramp. 7. History of right "lazy eye" corrective surgery. 8. History of approximately 10-pack-years of tobacco abuse, resolved.  9. History of possible migraines (morning headaches). History of asthma as a child.  PAST SURGICAL HISTORY: Past Surgical History  Procedure Laterality Date  . Rhinoplasty    . Adenoidectomy    . Fibroid tumor surgery    . Dilation and curettage of uterus    . Uterine cervical treatments  2002    for precancerous lesions  . Lazy eye      corrective surgery  . Mastectomy w/ sentinel node biopsy  06/29/2011    Procedure: MASTECTOMY WITH SENTINEL LYMPH NODE BIOPSY;  Surgeon: Adin Hector, MD;  Location: [facility];  Service: General;  Laterality: Right;  right skin spairing mstectomy and right sentinel lymph node biopsy  . Breast reconstruction  06/29/2011    Procedure: BREAST RECONSTRUCTION;  Surgeon: Theodoro Kos, DO;  Location: [facility];  Service: Plastics;  Laterality: Bilateral;  Immediate Bilateral Breast Reconstruction with Bilateral Tissue Expanders and placement of  Alloderm   . Port-a-cath removal  03/12/2012    Procedure: MINOR REMOVAL PORT-A-CATH;  Surgeon: Adin Hector, MD;  Location: [facility];  Service: General;  Laterality: N/A;   Upper left  . Breast lumpectomy with needle localization Right 05/05/2013    Procedure: EXCISION RECURRENT CANCER RIGHT BREAST WITH NEEDLE LOCALOZATION;  Surgeon: Adin Hector, MD;  Location: [facility];  Service: General;  Laterality: Right;  . Portacath placement Left 05/05/2013    Procedure: INSERTION PORT-A-CATH;  Surgeon: Adin Hector, MD;  Location: [facility];  Service: General;  Laterality: Left;    FAMILY HISTORY Family History  Problem Relation Age of Onset  . Adopted: Yes  . Heart attack Father   . Cancer Cousin 40    melanoma; mat first cousin, located on neck  . Cancer Maternal Aunt 60    stomach and ovarian as separate primaries  . Cancer Maternal Uncle 65    lung; smoker  . Cancer Maternal Grandmother 48    enodometrial  . Cancer Maternal Aunt 55    breast  . Cancer Maternal Uncle 63    prostate  :  The patient's biological father died at the  age of 53 from myocardial infarction.  The patient's biological mother is alive at age 69.  The patient was adopted by her biological mother's parents.  She has 3 half-brothers.  No sisters.  One cousin on her mother's side died from melanoma at the age of 22.  There is other cancer history in the adopted side but these are not even half-brothers or sisters, they are children of her adopted parents who are really her grandparents so these would be uncles and aunts (1 lymph node cancer, 1 prostate cancer, 1 breast cancer at the age of 54).  GYNECOLOGIC HISTORY:   (Updated 05/22/2013) She had menarche age 82 or 75.  She is GX, P1, first pregnancy to term at age 1. She stopped having periods approximately March 2013.  Hormone levels in February 2015 show patient to be premenopausal, with an estradiol of 113 on 05/15/2013, and her periods indeed resumed February of 2015  SOCIAL HISTORY: (Updated 06/12/2013) She works in a Engineer, agricultural.  Her husband, [name] owns a family business called [employer].  They have been married  5 years.  They have a son, [name], age 29. [name] also has a daughter, [name], who lives in [place] and is 44 years old.  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  The patient is not a church attender.    ADVANCED DIRECTIVES: Not in place  HEALTH MAINTENANCE:  (Updated 06/12/2013) History  Substance Use Topics  . Smoking status: Former Smoker    Quit date: 07/20/1996  . Smokeless tobacco: Never Used  . Alcohol Use: No     Colonoscopy:  Never  PAP: Oct 2014/[facility]  Bone density: Oct 2014, mild osteopenia with a T score of -1.1  Lipid panel: Not on file  Allergies  Allergen Reactions  . Tussionex Pennkinetic Er [Hydrocod Polst-Cpm Polst Er] Other (See Comments)    halucinations  . Codeine Other (See Comments)    unkown  . Morphine Rash  . Morphine And Related Anxiety    anxiety  . Penicillin G Rash  . Penicillins Rash    Current Outpatient Prescriptions  Medication Sig Dispense Refill  . aspirin-acetaminophen-caffeine (EXCEDRIN MIGRAINE) 250-250-65 MG per tablet Take 2 tablets by mouth every 6 (six) hours as needed for headache.      . b complex vitamins tablet Take 1 tablet by mouth daily.      . Biotin 5000 MCG CAPS Take 5,000 mcg by mouth daily.      . Calcium Carbonate-Vitamin D (CALCIUM-VITAMIN D) 500-200 MG-UNIT per tablet Take 1 tablet by mouth 2 (two) times daily with a meal.      . chlorpheniramine (ALLERGY) 4 MG tablet Take by mouth.      . doxycycline (VIBRA-TABS) 100 MG tablet Take 1 tablet (100 mg total) by mouth 2 (two) times daily.  30 tablet  6  . fluconazole (DIFLUCAN) 150 MG tablet       . HYDROcodone-acetaminophen (NORCO/VICODIN) 5-325 MG per tablet       . ibuprofen (ADVIL,MOTRIN) 800 MG tablet Take 1 tablet (800 mg total) by mouth every 8 (eight) hours as needed.  60 tablet  1  . lidocaine-prilocaine (EMLA) cream Apply cream to port 1-2hours prior to procedure  30 g  2  . LORazepam (ATIVAN) 0.5 MG tablet Take 1 tablet (0.5 mg total) by mouth 2 (two)  times daily as needed for anxiety.  60 tablet  0  . Multiple Vitamins-Minerals (HAIR/SKIN/NAILS PO) Take 1 tablet by  mouth daily.       . Omega-3 1000 MG CAPS Take 2 g by mouth daily.      . ondansetron (ZOFRAN) 8 MG tablet Take 1 tablet (8 mg total) by mouth 2 (two) times daily as needed for nausea or vomiting.  20 tablet  1  . prenatal vitamin w/FE, FA (PRENATAL 1 + 1) 27-1 MG TABS Take 1 tablet by mouth Daily.      . prochlorperazine (COMPAZINE) 5 MG tablet Take 1 tablet (5 mg total) by mouth every 6 (six) hours as needed for nausea or vomiting.  30 tablet  0  . traMADol (ULTRAM) 50 MG tablet Take 1 tablet (50 mg total) by mouth every 6 (six) hours as needed for moderate pain.  30 tablet  0   No current facility-administered medications for this visit.    OBJECTIVE: Middle-aged white woman in no acute distress  Filed Vitals:   08/25/13 1405  BP: 103/69  Pulse: 80  Temp: 98.1 F (36.7 C)  Resp: 18     Body mass index is 22.38 kg/(m^2).    ECOG FS: 1 Filed Weights   08/25/13 1405  Weight: 126 lb 4.8 oz (57.289 kg)   Sclerae unicteric,  EOMs intact, pupils round and reactive  Oropharynx clear and moist--  teeth in good repair  No cervical or supraclavicular adenopathy Lungs no rales or rhonchi Heart regular rate and rhythm Abd soft, nontender, positive bowel sounds MSK no focal spinal tenderness, no upper extremity lymphedema Neuro: nonfocal, well oriented,  positive  affect Breasts: Status post bilateral mastectomies with implant reconstruction. Of course she is status post recent right breast biopsy, which is not clinically apparent. There is no palpable abnormality and no visual abnormality in either breast. Both axillae are benign.  LAB RESULTS: Lab Results  Component Value Date   WBC 6.9 08/08/2013   NEUTROABS 4.4 08/08/2013   HGB 12.0 08/08/2013   HCT 36.2 08/08/2013   MCV 92.0 08/08/2013   PLT 227 08/08/2013      Chemistry      Component Value Date/Time   NA 142 08/08/2013  1131   NA 142 05/05/2013 1059   K 4.8 08/08/2013 1131   K 5.0 05/05/2013 1059   CL 103 05/05/2013 1059   CL 104 09/23/2012 1415   CO2 27 08/08/2013 1131   CO2 22 05/05/2013 1059   BUN 13.8 08/08/2013 1131   BUN 11 05/05/2013 1059   CREATININE 0.9 08/08/2013 1131   CREATININE 0.70 05/05/2013 1059   CREATININE 0.77 01/08/2013 1456      Component Value Date/Time   CALCIUM 10.1 08/08/2013 1131   CALCIUM 9.1 05/05/2013 1059   ALKPHOS 64 08/08/2013 1131   ALKPHOS 47 01/08/2013 1456   AST 53* 08/08/2013 1131   AST 18 01/08/2013 1456   ALT 50 08/08/2013 1131   ALT 13 01/08/2013 1456   BILITOT 0.32 08/08/2013 1131   BILITOT 0.5 01/08/2013 1456       STUDIES: No results found.  ASSESSMENT: 43 y.o.  BRCA 1-2 negative [name], [place] woman,   (1) status post right breast biopsy in September 2012 for a clinically 2.0 cm invasive ductal carcinoma, with some enlarged Right axillary lymph nodes, the largest one of which was negative by biopsy. The tumor was grade 2, estrogen receptor positive at 88%, progesterone receptor positive at 11%, HER-2/neu positive by CISH with a ratio of 4.61, and a proliferation marker of 36%.   (2) treated in the neoadjuvant setting  with Q 3 week docetaxel/ carboplatin/ trastuzumab x6, completed 05/18/2011.  Continued on trastuzumab every 3 weeks to complete one year, last dose in November 2013.  (3) s/p bilateral mastectomies with sentinel node biopsy 06/29/2011 for a residual right-sided ypT1c ypN0 invasive ductal carcinoma, grade 2, with immediate expander placement  (4) on tamoxifen as of May 2013, discontinued in early 2015 due to breast cancer recurrence   (5) status post left lumpectomy with left-sided sentinel lymph node sampling 05/05/2013 for a pT1b pN0, stage IA invasive ductal carcinoma, estrogen receptor 94% positive, progesterone receptor 11% positive, with an MIB-1 of 19% and HER-2 amplification  (6)  Receiving TDM-1 on a Q. three-week basis, first dose on 05/15/2013, PLAN  BEING FOR A TOTAL OF 8 DOSES BEFORE STARTING ANASTROZOLE/ goserelin AND PROCEEDING WITH RADIATION THERAPY  PLAN:  [name] is doing quite well overall. Particularly she is tolerating the TDM-1 without unusual side effects. The up and down changes in her transaminases may be related to the drug or may be related to fatty liver. There is no clear trend. This is reassuring.  Her headaches are complex. Some of them are nuchal and likely related to stress. Other skeletal as most of the day, are bitemporal, and associated with nausea and vomiting. She has never tried Imitrex for this and I wrote her the prescription today. She understands it is best for her to take it at the very beginning of some of the "bad" headaches. If this does not work then we will have to reassess.  The plan is for a total of 8 doses and she gets his sixth dose later this week, the seventh on June 11 and a final dose on July 2. She will see me 3 weeks after that at which time she will receive trastuzumab/ pertuzumab and be referred back to radiation for definitive local treatment  She has a good understanding of the overall plan. She agrees with it. She knows the goal of treatment in her case is cure. She will call with any problems that may develop before her next visit here.  Chauncey Cruel, MD     08/25/2013

## 2013-08-26 ENCOUNTER — Telehealth: Payer: Self-pay | Admitting: Oncology

## 2013-08-26 LAB — URINE CULTURE

## 2013-08-26 NOTE — Telephone Encounter (Signed)
s.w. pt and advised on May appt....pt will pick up new sched at nxt visit

## 2013-08-27 ENCOUNTER — Encounter: Payer: Self-pay | Admitting: Genetic Counselor

## 2013-08-27 DIAGNOSIS — C50311 Malignant neoplasm of lower-inner quadrant of right female breast: Secondary | ICD-10-CM

## 2013-08-27 DIAGNOSIS — Z800 Family history of malignant neoplasm of digestive organs: Secondary | ICD-10-CM

## 2013-08-27 DIAGNOSIS — Z8041 Family history of malignant neoplasm of ovary: Secondary | ICD-10-CM

## 2013-08-27 DIAGNOSIS — Z803 Family history of malignant neoplasm of breast: Secondary | ICD-10-CM

## 2013-08-27 NOTE — Progress Notes (Signed)
HPI:  [name] was previously seen in the [facility] clinic due to a personal and family history of cancer and concerns regarding a hereditary predisposition to cancer. Please refer to our prior cancer genetics clinic note for more information regarding [name]'s medical, social and family histories, and our assessment and recommendations, at the time. [name] recent genetic test results were disclosed to her, as were recommendations warranted by these results. These results and recommendations are discussed in more detail below.  GENETIC TEST RESULTS: At the time of [name]'s visit, we recommended she pursue genetic testing of the OvaNext gene panel. This test, which included sequencing and deletion/duplication analysis of the genes listed on the test report, was performed at [facility]. Genetic testing was normal, and did not reveal a mutation in these genes. A complete list of all genes tested is located on the test report scanned into EPIC.  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 At this time, it is unknown if this variant is associated with an increased risk for cancer or if this is a normal finding. With time, we suspect the lab will reclassify the variant and when they do, we will try to re-contact [name] to discuss the reclassification further. We discussed with [name] that since the current genetic testing is not perfect, it is possible there may be a gene mutation in one of these genes that current testing cannot detect, but that chance is small.  We also discussed, that it is possible that another gene that has not yet been discovered, or that we have not yet tested, is responsible for the cancer diagnoses in the family, and it is, therefore, important to remain in touch with cancer genetics in the future so that we can continue to offer [name] the most up to date genetic testing.    CANCER SCREENING RECOMMENDATIONS:  This result is reassuring and suggests that [name]'s cancer was most likely not due to an inherited predisposition associated with one of these genes.  Most cancers happen by chance and this negative test, along with details of her family history, suggests that her cancer falls into this category.  We, therefore, recommended she continue to follow the cancer management and screening guidelines provided by her oncology and primary providers.   RECOMMENDATIONS FOR FAMILY MEMBERS: Women in this family might be at some increased risk of developing cancer, over the general population risk, simply due to the family history of cancer.  We recommended women in this family have a yearly mammogram beginning at age 92, an an annual clinical breast exam, and perform monthly breast self-exams. Women in this family should also have a gynecological exam as recommended by their primary provider. All family members should have a colonoscopy by age 38.  FOLLOW-UP: Lastly, we discussed with [name] that cancer genetics is a rapidly advancing field and it is possible that new genetic tests will be appropriate for her and/or her family members in the future. We encouraged her to remain in contact with cancer genetics on an annual basis so we can update her personal and family histories and let her know of advances in cancer genetics that may benefit this family.   Our contact number was provided. [name] questions were answered to her satisfaction, and she knows she is welcome to call us at anytime with additional questions or concerns. This patient was discussed with Dr. Jana [name]  who agrees with the above.   Judith A. Fine, MS, CGC Certified Genetic Counseor phone: [phone] [email or web address]

## 2013-08-29 ENCOUNTER — Other Ambulatory Visit: Payer: Self-pay | Admitting: Oncology

## 2013-08-29 ENCOUNTER — Other Ambulatory Visit (HOSPITAL_BASED_OUTPATIENT_CLINIC_OR_DEPARTMENT_OTHER): Payer: BC Managed Care – PPO

## 2013-08-29 ENCOUNTER — Ambulatory Visit (HOSPITAL_BASED_OUTPATIENT_CLINIC_OR_DEPARTMENT_OTHER): Payer: BC Managed Care – PPO

## 2013-08-29 VITALS — BP 102/70 | HR 76 | Temp 98.1°F | Resp 18

## 2013-08-29 DIAGNOSIS — Z5112 Encounter for antineoplastic immunotherapy: Secondary | ICD-10-CM

## 2013-08-29 DIAGNOSIS — D696 Thrombocytopenia, unspecified: Secondary | ICD-10-CM

## 2013-08-29 DIAGNOSIS — Z9013 Acquired absence of bilateral breasts and nipples: Secondary | ICD-10-CM

## 2013-08-29 DIAGNOSIS — C50311 Malignant neoplasm of lower-inner quadrant of right female breast: Secondary | ICD-10-CM

## 2013-08-29 DIAGNOSIS — C50319 Malignant neoplasm of lower-inner quadrant of unspecified female breast: Secondary | ICD-10-CM

## 2013-08-29 DIAGNOSIS — C50919 Malignant neoplasm of unspecified site of unspecified female breast: Secondary | ICD-10-CM

## 2013-08-29 LAB — COMPREHENSIVE METABOLIC PANEL (CC13)
ALT: 45 U/L (ref 0–55)
AST: 53 U/L — ABNORMAL HIGH (ref 5–34)
Albumin: 4.2 g/dL (ref 3.5–5.0)
Alkaline Phosphatase: 76 U/L (ref 40–150)
Anion Gap: 10 mEq/L (ref 3–11)
BUN: 13.8 mg/dL (ref 7.0–26.0)
CO2: 26 mEq/L (ref 22–29)
Calcium: 9.8 mg/dL (ref 8.4–10.4)
Chloride: 106 mEq/L (ref 98–109)
Creatinine: 0.8 mg/dL (ref 0.6–1.1)
Glucose: 86 mg/dl (ref 70–140)
Potassium: 4.3 mEq/L (ref 3.5–5.1)
Sodium: 142 mEq/L (ref 136–145)
Total Bilirubin: 0.42 mg/dL (ref 0.20–1.20)
Total Protein: 7.1 g/dL (ref 6.4–8.3)

## 2013-08-29 LAB — CBC WITH DIFFERENTIAL/PLATELET
BASO%: 0.6 % (ref 0.0–2.0)
Basophils Absolute: 0 10*3/uL (ref 0.0–0.1)
EOS%: 1.7 % (ref 0.0–7.0)
Eosinophils Absolute: 0.1 10*3/uL (ref 0.0–0.5)
HCT: 40.4 % (ref 34.8–46.6)
HGB: 13.3 g/dL (ref 11.6–15.9)
LYMPH%: 24 % (ref 14.0–49.7)
MCH: 30.1 pg (ref 25.1–34.0)
MCHC: 33 g/dL (ref 31.5–36.0)
MCV: 91.3 fL (ref 79.5–101.0)
MONO#: 0.4 10*3/uL (ref 0.1–0.9)
MONO%: 5.9 % (ref 0.0–14.0)
NEUT#: 4.2 10*3/uL (ref 1.5–6.5)
NEUT%: 67.8 % (ref 38.4–76.8)
Platelets: 203 10*3/uL (ref 145–400)
RBC: 4.42 10*6/uL (ref 3.70–5.45)
RDW: 12.8 % (ref 11.2–14.5)
WBC: 6.2 10*3/uL (ref 3.9–10.3)
lymph#: 1.5 10*3/uL (ref 0.9–3.3)

## 2013-08-29 MED ORDER — SODIUM CHLORIDE 0.9 % IJ SOLN
10.0000 mL | INTRAMUSCULAR | Status: DC | PRN
  Administered 2013-08-29: 10 mL
  Filled 2013-08-29: qty 10

## 2013-08-29 MED ORDER — DIPHENHYDRAMINE HCL 25 MG PO CAPS
50.0000 mg | ORAL_CAPSULE | Freq: Once | ORAL | Status: AC
  Administered 2013-08-29: 50 mg via ORAL

## 2013-08-29 MED ORDER — HEPARIN SOD (PORK) LOCK FLUSH 100 UNIT/ML IV SOLN
500.0000 [IU] | Freq: Once | INTRAVENOUS | Status: AC | PRN
  Administered 2013-08-29: 500 [IU]
  Filled 2013-08-29: qty 5

## 2013-08-29 MED ORDER — ACETAMINOPHEN 325 MG PO TABS
650.0000 mg | ORAL_TABLET | Freq: Once | ORAL | Status: AC
  Administered 2013-08-29: 650 mg via ORAL

## 2013-08-29 MED ORDER — SODIUM CHLORIDE 0.9 % IV SOLN
Freq: Once | INTRAVENOUS | Status: AC
  Administered 2013-08-29: 12:00:00 via INTRAVENOUS

## 2013-08-29 MED ORDER — SODIUM CHLORIDE 0.9 % IV SOLN
3.6000 mg/kg | Freq: Once | INTRAVENOUS | Status: AC
  Administered 2013-08-29: 200 mg via INTRAVENOUS
  Filled 2013-08-29: qty 10

## 2013-08-29 MED ORDER — ACETAMINOPHEN 325 MG PO TABS
ORAL_TABLET | ORAL | Status: AC
  Filled 2013-08-29: qty 2

## 2013-08-29 MED ORDER — DIPHENHYDRAMINE HCL 25 MG PO CAPS
ORAL_CAPSULE | ORAL | Status: AC
  Filled 2013-08-29: qty 2

## 2013-08-29 NOTE — Patient Instructions (Signed)
[facility] Discharge Instructions for Patients Receiving Chemotherapy  Today you received the following chemotherapy agents: kadcyla  To help prevent nausea and vomiting after your treatment, we encourage you to take your nausea medication.  Take it as often as prescribed.     If you develop nausea and vomiting that is not controlled by your nausea medication, call the clinic. If it is after clinic hours your family physician or the after hours number for the clinic or go to the Emergency Department.   BELOW ARE SYMPTOMS THAT SHOULD BE REPORTED IMMEDIATELY:  *FEVER GREATER THAN 100.5 F  *CHILLS WITH OR WITHOUT FEVER  NAUSEA AND VOMITING THAT IS NOT CONTROLLED WITH YOUR NAUSEA MEDICATION  *UNUSUAL SHORTNESS OF BREATH  *UNUSUAL BRUISING OR BLEEDING  TENDERNESS IN MOUTH AND THROAT WITH OR WITHOUT PRESENCE OF ULCERS  *URINARY PROBLEMS  *BOWEL PROBLEMS  UNUSUAL RASH Items with * indicate a potential emergency and should be followed up as soon as possible.  Feel free to call the clinic you have any questions or concerns. The clinic phone number is [phone].   I have been informed and understand all the instructions given to me. I know to contact the clinic, my physician, or go to the Emergency Department if any problems should occur. I do not have any questions at this time, but understand that I may call the clinic during office hours   should I have any questions or need assistance in obtaining follow up care.    __________________________________________  _____________  __________ Signature of Patient or Authorized Representative            Date                   Time    __________________________________________ Nurse's Signature    

## 2013-09-07 ENCOUNTER — Other Ambulatory Visit: Payer: Self-pay | Admitting: Oncology

## 2013-09-07 NOTE — Progress Notes (Unsigned)
[name]'s [facility] 08/25/2013 report shoed only a VUS  (p. [id number]  (c. [id number]) in ATM

## 2013-09-08 ENCOUNTER — Telehealth: Payer: Self-pay | Admitting: *Deleted

## 2013-09-08 ENCOUNTER — Other Ambulatory Visit: Payer: Self-pay | Admitting: *Deleted

## 2013-09-10 ENCOUNTER — Telehealth: Payer: Self-pay | Admitting: Oncology

## 2013-09-10 NOTE — Telephone Encounter (Signed)
per response from desk nurse ok for pt to cx f/u appt 6/12 and only have lab tx. s/w pt and she is aware that f/u has been cxd and new time for lb/tx 6/12 is 11am.

## 2013-09-19 ENCOUNTER — Ambulatory Visit: Payer: BC Managed Care – PPO

## 2013-09-19 ENCOUNTER — Ambulatory Visit (HOSPITAL_BASED_OUTPATIENT_CLINIC_OR_DEPARTMENT_OTHER): Payer: BC Managed Care – PPO

## 2013-09-19 ENCOUNTER — Other Ambulatory Visit (HOSPITAL_BASED_OUTPATIENT_CLINIC_OR_DEPARTMENT_OTHER): Payer: BC Managed Care – PPO

## 2013-09-19 ENCOUNTER — Ambulatory Visit: Payer: BC Managed Care – PPO | Admitting: Physician Assistant

## 2013-09-19 ENCOUNTER — Other Ambulatory Visit: Payer: BC Managed Care – PPO

## 2013-09-19 DIAGNOSIS — C50319 Malignant neoplasm of lower-inner quadrant of unspecified female breast: Secondary | ICD-10-CM

## 2013-09-19 DIAGNOSIS — Z5112 Encounter for antineoplastic immunotherapy: Secondary | ICD-10-CM

## 2013-09-19 DIAGNOSIS — Z9013 Acquired absence of bilateral breasts and nipples: Secondary | ICD-10-CM

## 2013-09-19 DIAGNOSIS — C50919 Malignant neoplasm of unspecified site of unspecified female breast: Secondary | ICD-10-CM

## 2013-09-19 DIAGNOSIS — C50311 Malignant neoplasm of lower-inner quadrant of right female breast: Secondary | ICD-10-CM

## 2013-09-19 DIAGNOSIS — D696 Thrombocytopenia, unspecified: Secondary | ICD-10-CM

## 2013-09-19 LAB — COMPREHENSIVE METABOLIC PANEL (CC13)
ALT: 43 U/L (ref 0–55)
AST: 58 U/L — ABNORMAL HIGH (ref 5–34)
Albumin: 4.1 g/dL (ref 3.5–5.0)
Alkaline Phosphatase: 83 U/L (ref 40–150)
Anion Gap: 7 mEq/L (ref 3–11)
BUN: 13.4 mg/dL (ref 7.0–26.0)
CO2: 29 mEq/L (ref 22–29)
Calcium: 10 mg/dL (ref 8.4–10.4)
Chloride: 104 mEq/L (ref 98–109)
Creatinine: 0.8 mg/dL (ref 0.6–1.1)
Glucose: 87 mg/dl (ref 70–140)
Potassium: 4.8 mEq/L (ref 3.5–5.1)
Sodium: 140 mEq/L (ref 136–145)
Total Bilirubin: 0.44 mg/dL (ref 0.20–1.20)
Total Protein: 6.9 g/dL (ref 6.4–8.3)

## 2013-09-19 LAB — CBC WITH DIFFERENTIAL/PLATELET
BASO%: 0.2 % (ref 0.0–2.0)
Basophils Absolute: 0 10*3/uL (ref 0.0–0.1)
EOS%: 1.6 % (ref 0.0–7.0)
Eosinophils Absolute: 0.1 10*3/uL (ref 0.0–0.5)
HCT: 38.5 % (ref 34.8–46.6)
HGB: 13 g/dL (ref 11.6–15.9)
LYMPH%: 41.4 % (ref 14.0–49.7)
MCH: 29.9 pg (ref 25.1–34.0)
MCHC: 33.8 g/dL (ref 31.5–36.0)
MCV: 88.5 fL (ref 79.5–101.0)
MONO#: 0.3 10*3/uL (ref 0.1–0.9)
MONO%: 6.2 % (ref 0.0–14.0)
NEUT#: 2.5 10*3/uL (ref 1.5–6.5)
NEUT%: 50.6 % (ref 38.4–76.8)
Platelets: 165 10*3/uL (ref 145–400)
RBC: 4.35 10*6/uL (ref 3.70–5.45)
RDW: 12.6 % (ref 11.2–14.5)
WBC: 5 10*3/uL (ref 3.9–10.3)
lymph#: 2.1 10*3/uL (ref 0.9–3.3)

## 2013-09-19 MED ORDER — DIPHENHYDRAMINE HCL 25 MG PO CAPS
ORAL_CAPSULE | ORAL | Status: AC
  Filled 2013-09-19: qty 2

## 2013-09-19 MED ORDER — SODIUM CHLORIDE 0.9 % IJ SOLN
10.0000 mL | INTRAMUSCULAR | Status: DC | PRN
  Administered 2013-09-19: 10 mL
  Filled 2013-09-19: qty 10

## 2013-09-19 MED ORDER — ACETAMINOPHEN 325 MG PO TABS
650.0000 mg | ORAL_TABLET | Freq: Once | ORAL | Status: AC
  Administered 2013-09-19: 650 mg via ORAL

## 2013-09-19 MED ORDER — SODIUM CHLORIDE 0.9 % IV SOLN
Freq: Once | INTRAVENOUS | Status: AC
  Administered 2013-09-19: 12:00:00 via INTRAVENOUS

## 2013-09-19 MED ORDER — ACETAMINOPHEN 325 MG PO TABS
ORAL_TABLET | ORAL | Status: AC
  Filled 2013-09-19: qty 2

## 2013-09-19 MED ORDER — DIPHENHYDRAMINE HCL 25 MG PO CAPS
50.0000 mg | ORAL_CAPSULE | Freq: Once | ORAL | Status: AC
  Administered 2013-09-19: 50 mg via ORAL

## 2013-09-19 MED ORDER — SODIUM CHLORIDE 0.9 % IV SOLN
3.6000 mg/kg | Freq: Once | INTRAVENOUS | Status: AC
  Administered 2013-09-19: 200 mg via INTRAVENOUS
  Filled 2013-09-19: qty 10

## 2013-09-19 MED ORDER — HEPARIN SOD (PORK) LOCK FLUSH 100 UNIT/ML IV SOLN
500.0000 [IU] | Freq: Once | INTRAVENOUS | Status: AC | PRN
  Administered 2013-09-19: 500 [IU]
  Filled 2013-09-19: qty 5

## 2013-09-19 NOTE — Patient Instructions (Signed)
[facility] Discharge Instructions for Patients Receiving Chemotherapy  Today you received the following agents: Kadcyla.  To help prevent nausea and vomiting after your treatment, we encourage you to take your nausea medication as needed.   If you develop nausea and vomiting that is not controlled by your nausea medication, call the clinic.   BELOW ARE SYMPTOMS THAT SHOULD BE REPORTED IMMEDIATELY:  *FEVER GREATER THAN 100.5 F  *CHILLS WITH OR WITHOUT FEVER  NAUSEA AND VOMITING THAT IS NOT CONTROLLED WITH YOUR NAUSEA MEDICATION  *UNUSUAL SHORTNESS OF BREATH  *UNUSUAL BRUISING OR BLEEDING  TENDERNESS IN MOUTH AND THROAT WITH OR WITHOUT PRESENCE OF ULCERS  *URINARY PROBLEMS  *BOWEL PROBLEMS  UNUSUAL RASH Items with * indicate a potential emergency and should be followed up as soon as possible.  Feel free to call the clinic should you have any questions or concerns. The clinic phone number is [phone].

## 2013-09-25 ENCOUNTER — Other Ambulatory Visit: Payer: Self-pay | Admitting: Physician Assistant

## 2013-09-25 DIAGNOSIS — C50311 Malignant neoplasm of lower-inner quadrant of right female breast: Secondary | ICD-10-CM

## 2013-09-25 DIAGNOSIS — C50911 Malignant neoplasm of unspecified site of right female breast: Secondary | ICD-10-CM

## 2013-09-30 ENCOUNTER — Other Ambulatory Visit: Payer: Self-pay | Admitting: Physician Assistant

## 2013-09-30 DIAGNOSIS — C50319 Malignant neoplasm of lower-inner quadrant of unspecified female breast: Secondary | ICD-10-CM

## 2013-10-01 ENCOUNTER — Other Ambulatory Visit: Payer: Self-pay | Admitting: Physician Assistant

## 2013-10-02 ENCOUNTER — Other Ambulatory Visit: Payer: Self-pay | Admitting: *Deleted

## 2013-10-08 ENCOUNTER — Telehealth: Payer: Self-pay | Admitting: *Deleted

## 2013-10-08 NOTE — Telephone Encounter (Signed)
Pt called stating that she spoke with Leda Quail and Cat requested her to call me to schedule a f/u appt.  Confirmed 10/24/13 genetic appt w/ pt.

## 2013-10-09 ENCOUNTER — Ambulatory Visit (HOSPITAL_BASED_OUTPATIENT_CLINIC_OR_DEPARTMENT_OTHER): Payer: BC Managed Care – PPO

## 2013-10-09 ENCOUNTER — Other Ambulatory Visit (HOSPITAL_BASED_OUTPATIENT_CLINIC_OR_DEPARTMENT_OTHER): Payer: BC Managed Care – PPO

## 2013-10-09 ENCOUNTER — Ambulatory Visit: Payer: BC Managed Care – PPO | Admitting: Physician Assistant

## 2013-10-09 VITALS — BP 94/67 | HR 77 | Temp 98.3°F | Resp 16

## 2013-10-09 DIAGNOSIS — C50319 Malignant neoplasm of lower-inner quadrant of unspecified female breast: Secondary | ICD-10-CM

## 2013-10-09 DIAGNOSIS — Z5112 Encounter for antineoplastic immunotherapy: Secondary | ICD-10-CM

## 2013-10-09 DIAGNOSIS — Z9013 Acquired absence of bilateral breasts and nipples: Secondary | ICD-10-CM

## 2013-10-09 DIAGNOSIS — C50311 Malignant neoplasm of lower-inner quadrant of right female breast: Secondary | ICD-10-CM

## 2013-10-09 DIAGNOSIS — C50911 Malignant neoplasm of unspecified site of right female breast: Secondary | ICD-10-CM

## 2013-10-09 DIAGNOSIS — C50919 Malignant neoplasm of unspecified site of unspecified female breast: Secondary | ICD-10-CM

## 2013-10-09 LAB — CBC WITH DIFFERENTIAL/PLATELET
BASO%: 0.6 % (ref 0.0–2.0)
Basophils Absolute: 0 10*3/uL (ref 0.0–0.1)
EOS%: 1.4 % (ref 0.0–7.0)
Eosinophils Absolute: 0.1 10*3/uL (ref 0.0–0.5)
HCT: 39.1 % (ref 34.8–46.6)
HGB: 12.9 g/dL (ref 11.6–15.9)
LYMPH%: 32.5 % (ref 14.0–49.7)
MCH: 29.6 pg (ref 25.1–34.0)
MCHC: 33.1 g/dL (ref 31.5–36.0)
MCV: 89.5 fL (ref 79.5–101.0)
MONO#: 0.3 10*3/uL (ref 0.1–0.9)
MONO%: 6.3 % (ref 0.0–14.0)
NEUT#: 3.3 10*3/uL (ref 1.5–6.5)
NEUT%: 59.2 % (ref 38.4–76.8)
Platelets: 148 10*3/uL (ref 145–400)
RBC: 4.37 10*6/uL (ref 3.70–5.45)
RDW: 13.3 % (ref 11.2–14.5)
WBC: 5.6 10*3/uL (ref 3.9–10.3)
lymph#: 1.8 10*3/uL (ref 0.9–3.3)

## 2013-10-09 LAB — COMPREHENSIVE METABOLIC PANEL (CC13)
ALT: 54 U/L (ref 0–55)
AST: 65 U/L — ABNORMAL HIGH (ref 5–34)
Albumin: 4 g/dL (ref 3.5–5.0)
Alkaline Phosphatase: 87 U/L (ref 40–150)
Anion Gap: 10 mEq/L (ref 3–11)
BUN: 16.3 mg/dL (ref 7.0–26.0)
CO2: 28 mEq/L (ref 22–29)
Calcium: 10 mg/dL (ref 8.4–10.4)
Chloride: 105 mEq/L (ref 98–109)
Creatinine: 0.9 mg/dL (ref 0.6–1.1)
Glucose: 77 mg/dl (ref 70–140)
Potassium: 3.9 mEq/L (ref 3.5–5.1)
Sodium: 143 mEq/L (ref 136–145)
Total Bilirubin: 0.39 mg/dL (ref 0.20–1.20)
Total Protein: 6.6 g/dL (ref 6.4–8.3)

## 2013-10-09 MED ORDER — ACETAMINOPHEN 325 MG PO TABS
650.0000 mg | ORAL_TABLET | Freq: Once | ORAL | Status: AC
  Administered 2013-10-09: 650 mg via ORAL

## 2013-10-09 MED ORDER — DIPHENHYDRAMINE HCL 25 MG PO CAPS
50.0000 mg | ORAL_CAPSULE | Freq: Once | ORAL | Status: AC
  Administered 2013-10-09: 50 mg via ORAL

## 2013-10-09 MED ORDER — ACETAMINOPHEN 325 MG PO TABS
ORAL_TABLET | ORAL | Status: AC
  Filled 2013-10-09: qty 2

## 2013-10-09 MED ORDER — SODIUM CHLORIDE 0.9 % IV SOLN
3.6000 mg/kg | Freq: Once | INTRAVENOUS | Status: AC
  Administered 2013-10-09: 200 mg via INTRAVENOUS
  Filled 2013-10-09: qty 10

## 2013-10-09 MED ORDER — SODIUM CHLORIDE 0.9 % IV SOLN
Freq: Once | INTRAVENOUS | Status: AC
  Administered 2013-10-09: 13:00:00 via INTRAVENOUS

## 2013-10-09 MED ORDER — SODIUM CHLORIDE 0.9 % IJ SOLN
10.0000 mL | INTRAMUSCULAR | Status: DC | PRN
  Administered 2013-10-09: 10 mL
  Filled 2013-10-09: qty 10

## 2013-10-09 MED ORDER — DIPHENHYDRAMINE HCL 25 MG PO CAPS
ORAL_CAPSULE | ORAL | Status: AC
  Filled 2013-10-09: qty 2

## 2013-10-09 MED ORDER — HEPARIN SOD (PORK) LOCK FLUSH 100 UNIT/ML IV SOLN
500.0000 [IU] | Freq: Once | INTRAVENOUS | Status: AC | PRN
  Administered 2013-10-09: 500 [IU]
  Filled 2013-10-09: qty 5

## 2013-10-09 NOTE — Patient Instructions (Signed)
[facility] Discharge Instructions for Patients Receiving Chemotherapy  Today you received the following chemotherapy agents: Kadcyla  To help prevent nausea and vomiting after your treatment, we encourage you to take your nausea medication as prescribed by your physician   If you develop nausea and vomiting that is not controlled by your nausea medication, call the clinic.   BELOW ARE SYMPTOMS THAT SHOULD BE REPORTED IMMEDIATELY:  *FEVER GREATER THAN 100.5 F  *CHILLS WITH OR WITHOUT FEVER  NAUSEA AND VOMITING THAT IS NOT CONTROLLED WITH YOUR NAUSEA MEDICATION  *UNUSUAL SHORTNESS OF BREATH  *UNUSUAL BRUISING OR BLEEDING  TENDERNESS IN MOUTH AND THROAT WITH OR WITHOUT PRESENCE OF ULCERS  *URINARY PROBLEMS  *BOWEL PROBLEMS  UNUSUAL RASH Items with * indicate a potential emergency and should be followed up as soon as possible.  Feel free to call the clinic you have any questions or concerns. The clinic phone number is [phone].    

## 2013-10-10 ENCOUNTER — Other Ambulatory Visit: Payer: BC Managed Care – PPO

## 2013-10-14 ENCOUNTER — Telehealth: Payer: Self-pay | Admitting: *Deleted

## 2013-10-14 NOTE — Telephone Encounter (Signed)
Pt called stating that she would like to be seen sooner for genetics.  We do not have anything available that fits her scheduled so she has agreed to keep her appt for 7/17.

## 2013-10-14 NOTE — Telephone Encounter (Signed)
Received voice message from patient stating, "I have bruising on my left hand, between the ring finger and middle finger, above the knuckles. Also, I have a bruise on the bottom of my thumb on the left hand, inside my left wrist, on the back of my right leg, under my butt cheek. I do take Excedrin for headaches and I took one yesterday. I don't know how the bruises got there. Also, I get a weird feeling in my head when I'm driving down the street with the [facility] on. I don't know where it's coming from. Could Kadcycla becausing this?" Return number is [phone]. I informed patient that I would give this note to Dr. Jana Hakim and we would call her back.

## 2013-10-24 ENCOUNTER — Ambulatory Visit (HOSPITAL_BASED_OUTPATIENT_CLINIC_OR_DEPARTMENT_OTHER): Payer: BC Managed Care – PPO | Admitting: Genetic Counselor

## 2013-10-24 DIAGNOSIS — Z8041 Family history of malignant neoplasm of ovary: Secondary | ICD-10-CM

## 2013-10-24 DIAGNOSIS — C50319 Malignant neoplasm of lower-inner quadrant of unspecified female breast: Secondary | ICD-10-CM

## 2013-10-24 DIAGNOSIS — Z803 Family history of malignant neoplasm of breast: Secondary | ICD-10-CM

## 2013-10-24 DIAGNOSIS — Z800 Family history of malignant neoplasm of digestive organs: Secondary | ICD-10-CM

## 2013-10-24 DIAGNOSIS — IMO0002 Reserved for concepts with insufficient information to code with codable children: Secondary | ICD-10-CM

## 2013-10-24 NOTE — Progress Notes (Signed)
HISTORY OF PRESENT ILLNESS:  [name], a 43 y.o. female, was seen for an in-person follow-up visit in the [place] clinic to further discuss her genetic test results. Please refer to our prior clinic notes for a detailed medical and family history, and a summary of her genetic test results.   At today's visit, we reviewed with [name] her results of the OvaNext gene panel, which was performed at [facility]. Specifically, we discussed that there were no pathogenic mutations identified in any of the genes on the panel. We discussed that and ATM gene variant of unknown significance was identified, called ATM, [id number]; however, this variant is not something we would change her medical management for, rather we will wait for a reclassification of the variant. This is due to the fact that most variants will be reclassified to benign. [name] did find this reassuring.   ASSESSMENT / RECOMMENDATIONS: We discussed again with [name] that this result is reassuring and suggests that [name]'s cancer was most likely not due to an inherited predisposition associated with one of these genes. Most cancers happen by chance and this negative test, along with details of her family history, suggests that her cancer falls into this category. We, therefore, recommended she continue to follow the cancer management and screening guidelines provided by her oncology and primary providers.   Lastly, we discussed with [name] that cancer genetics is a rapidly advancing field and it is possible that new genetic tests will be appropriate for her and/or her family members in the future. We encouraged her to remain in contact with cancer genetics on an annual basis so we can update her personal and family histories and let her know of advances in cancer genetics that may benefit this family. Our contact number was provided. [name] questions were answered to her satisfaction, and she  knows she is welcome to call us at anytime with additional questions or concerns.   The patient was seen for a total of 15 minutes in face-to-face genetic counseling.  This patient was discussed with Dr. Jana Hakim who agrees with the above.    _______________________________________________________________________ For Office Staff:  Number of people involved in session: 2 Was an Intern/ student involved with case: not applicable

## 2013-10-30 ENCOUNTER — Ambulatory Visit (HOSPITAL_BASED_OUTPATIENT_CLINIC_OR_DEPARTMENT_OTHER): Payer: BC Managed Care – PPO | Admitting: Oncology

## 2013-10-30 ENCOUNTER — Other Ambulatory Visit (HOSPITAL_BASED_OUTPATIENT_CLINIC_OR_DEPARTMENT_OTHER): Payer: BC Managed Care – PPO

## 2013-10-30 ENCOUNTER — Other Ambulatory Visit: Payer: Self-pay | Admitting: *Deleted

## 2013-10-30 ENCOUNTER — Telehealth: Payer: Self-pay | Admitting: Oncology

## 2013-10-30 VITALS — BP 95/76 | HR 78 | Temp 98.4°F | Resp 20 | Ht 63.0 in | Wt 125.5 lb

## 2013-10-30 DIAGNOSIS — C50311 Malignant neoplasm of lower-inner quadrant of right female breast: Secondary | ICD-10-CM

## 2013-10-30 DIAGNOSIS — Z9013 Acquired absence of bilateral breasts and nipples: Secondary | ICD-10-CM

## 2013-10-30 DIAGNOSIS — Z170 Estrogen receptor positive status [ER+]: Secondary | ICD-10-CM

## 2013-10-30 DIAGNOSIS — C50319 Malignant neoplasm of lower-inner quadrant of unspecified female breast: Secondary | ICD-10-CM

## 2013-10-30 DIAGNOSIS — C50911 Malignant neoplasm of unspecified site of right female breast: Secondary | ICD-10-CM

## 2013-10-30 DIAGNOSIS — C50919 Malignant neoplasm of unspecified site of unspecified female breast: Secondary | ICD-10-CM

## 2013-10-30 DIAGNOSIS — F411 Generalized anxiety disorder: Secondary | ICD-10-CM

## 2013-10-30 LAB — CBC WITH DIFFERENTIAL/PLATELET
BASO%: 0.5 % (ref 0.0–2.0)
Basophils Absolute: 0 10*3/uL (ref 0.0–0.1)
EOS%: 1.8 % (ref 0.0–7.0)
Eosinophils Absolute: 0.1 10*3/uL (ref 0.0–0.5)
HCT: 39.8 % (ref 34.8–46.6)
HGB: 12.9 g/dL (ref 11.6–15.9)
LYMPH%: 33.4 % (ref 14.0–49.7)
MCH: 29.2 pg (ref 25.1–34.0)
MCHC: 32.5 g/dL (ref 31.5–36.0)
MCV: 89.9 fL (ref 79.5–101.0)
MONO#: 0.4 10*3/uL (ref 0.1–0.9)
MONO%: 6.4 % (ref 0.0–14.0)
NEUT#: 3.4 10*3/uL (ref 1.5–6.5)
NEUT%: 57.9 % (ref 38.4–76.8)
Platelets: 168 10*3/uL (ref 145–400)
RBC: 4.43 10*6/uL (ref 3.70–5.45)
RDW: 14.2 % (ref 11.2–14.5)
WBC: 5.9 10*3/uL (ref 3.9–10.3)
lymph#: 2 10*3/uL (ref 0.9–3.3)

## 2013-10-30 LAB — COMPREHENSIVE METABOLIC PANEL (CC13)
ALT: 62 U/L — ABNORMAL HIGH (ref 0–55)
AST: 79 U/L — ABNORMAL HIGH (ref 5–34)
Albumin: 4 g/dL (ref 3.5–5.0)
Alkaline Phosphatase: 103 U/L (ref 40–150)
Anion Gap: 7 mEq/L (ref 3–11)
BUN: 13 mg/dL (ref 7.0–26.0)
CO2: 29 mEq/L (ref 22–29)
Calcium: 9.9 mg/dL (ref 8.4–10.4)
Chloride: 106 mEq/L (ref 98–109)
Creatinine: 0.8 mg/dL (ref 0.6–1.1)
Glucose: 86 mg/dl (ref 70–140)
Potassium: 4.3 mEq/L (ref 3.5–5.1)
Sodium: 142 mEq/L (ref 136–145)
Total Bilirubin: 0.46 mg/dL (ref 0.20–1.20)
Total Protein: 7.1 g/dL (ref 6.4–8.3)

## 2013-10-30 MED ORDER — VENLAFAXINE HCL ER 37.5 MG PO CP24: 37.5000 mg | ORAL_CAPSULE | Freq: Every day | ORAL | Status: DC

## 2013-10-30 NOTE — Telephone Encounter (Signed)
per pof to sch pt appt-referal to Dr Floyde Parkins sch-gave pt copy of sch

## 2013-10-30 NOTE — Progress Notes (Signed)
ID: [name]   DOB: 1970-08-20  MR#: [id number]  WUJ#:[id number]   PCP: Chauncey Cruel, MD GYN: Baltazar Najjar MD SU: Fanny Skates MD OTHER MD: Theodoro Kos, [name]  CHIEF COMPLAINT:  Recurrent Breast Cancer, Right CURRENT THERAPY: To start radiation therapy   BREAST CANCER HISTORY:  From the original intake note:  "[name] (goes by "[name]") is a [place] woman who, at the age of 43, was referred by Dr. Baltazar Najjar for treatment of right breast carcinoma.   1.  The patient palpated a mass in her right breast which she brought to Dr. Jacelyn Grip attention. A prior screening mammogram on 02/06/2010 had been unremarkable. The patient was then scheduled for bilateral diagnostic mammogram and right breast ultrasonography on 12/22/2010, the studies demonstrating an ill defined mass with heterogeneous calcifications in the inner lower right breast, measuring 1.7 cm. The breasts are heterogeneously dense, but there were no other mammographic abnormalities in either breast. Clinically, there was a palpable firm mass at the 5:00 position of the right breast, 5 cm from the nipple. There were no palpable abnormalities in the right axilla. Ultrasound confirmed a 1.7 cm irregular, hypoechoic mass in the same position. There were some lymph nodes noted in the right exam of, one measuring 1.6 cm with a minimally thickened cortex inferiorly.  An ultrasound-guided core biopsy on 12/23/2010 [phone]) showed an invasive ductal carcinoma, grade 2, ER positive at 88%, PR positive at 11 is %, HER-2/neu positive with a FISH ratio of 4.61, and an MIB-1 of 36%.  The patient was referred to Dr. Fanny Skates and bilateral breast MRIs were obtained 12/30/2010. This showed the mass in question in the right breast to measure 2.0 cm. In the right axilla there was a 1.8 cm lymph node with a thickened cortex. The left breast was unremarkable.  Her subsequent treatment is as detailed  below.  2.   Cyntia then had a restaging PET scan late December 2014 which was positive only for a small subcutaneous nodule in the right breast lower outer quadrant. There was no other finding of concern. Ultrasound of both breasts 04/08/2013 showed a nodule in the right breast at the 4:00 position measuring 0.7 cm. The right axilla was clear. There was an area at the 7:00 position of the left breast which was of concern by palpation but showed only normal tissue.   Biopsy of the right breast mass 04/09/2013 showed an invasive ductal carcinoma, grade 2, estrogen receptor 94% positive, progesterone receptor 11% positive, with an MIB-1 of 19% and HER-2 amplified with a signals ratio of 3.75 and the number per cell of 4.50. The patient discussed the situation with with her surgeon's and on 05/05/2013 proceeded to right lumpectomy, with the final pathology (S. is a [id number]) confirming an invasive ductal carcinoma, 0.7 cm, grade 2, within a millimeter of the posterior margin, which showed skeletal muscle. A port was placed at the time of that procedure."  Her subsequent treatment is as detailed below.  INTERVAL HISTORY:  [name] returns today for followup of her recurrent right breast cancer accompanied by her husband [name].  She is currently day 22 cycle 8 of 8 planned cycles of  TDM-1. She tolerated the treatment generally well, and is here today to discuss the next step in therapy   REVIEW OF SYSTEMS:  [name] denies any shortness of breath, weakness, palpitations, peripheral edema, or other symptoms suggestive of congestive heart failure. She continues to have occasional headaches, and tolerated  the Imitrex very poorly, but there have been no visual changes, cough, or pleurisy. She sleeps poorly,  Is moderately fatigued, but continues to work full-time. Sometimes she notices a throbbing pain or the surgical sites. These can be more persistentf or very intermittent. She still has some blurred vision. She has a  runny nose and some sinus symptoms, but no pain on swallowing and no sore throat. Her appetite is good but sometimes she feels nauseated. She does not actually vomits. She has some back pain, which is not more persistent or intense than before. She admits to being anxious but not depressed. She has some hot flashes. She thinks she may have developed "chemotherapy belly" meaning bloating and a feeling of being swollen. A detailed review of systems was otherwise stable   PAST MEDICAL HISTORY: Past Medical History  Diagnosis Date  . Migraines   . Asthma     as a child  . Breast cancer Sept 2012    right breast  . PONV (postoperative nausea and vomiting)   . Rash     neck  . Anxiety   . Pneumonia   . Kidney stones   . GERD (gastroesophageal reflux disease)     had during chemo  . Cervical dysplasia   1. History of prior adenoidectomy. 2. History of rhinoplasty.   3. History of multiple uterine cervical treatments for precancerous lesions, none for the past 10 years or so. 4. History of fibroid tumor surgery. 5. History of D and C. 6. History of dysplastic nevi removed by Dr. Ronnald Ramp. 7. History of right "lazy eye" corrective surgery. 8. History of approximately 10-pack-years of tobacco abuse, resolved.  9. History of possible migraines (morning headaches). History of asthma as a child.  PAST SURGICAL HISTORY: Past Surgical History  Procedure Laterality Date  . Rhinoplasty    . Adenoidectomy    . Fibroid tumor surgery    . Dilation and curettage of uterus    . Uterine cervical treatments  2002    for precancerous lesions  . Lazy eye      corrective surgery  . Mastectomy w/ sentinel node biopsy  06/29/2011    Procedure: MASTECTOMY WITH SENTINEL LYMPH NODE BIOPSY;  Surgeon: Adin Hector, MD;  Location: [place];  Service: General;  Laterality: Right;  right skin spairing mstectomy and right sentinel lymph node biopsy  . Breast reconstruction  06/29/2011    Procedure: BREAST  RECONSTRUCTION;  Surgeon: Theodoro Kos, DO;  Location: [place];  Service: Plastics;  Laterality: Bilateral;  Immediate Bilateral Breast Reconstruction with Bilateral Tissue Expanders and placement of  Alloderm   . Port-a-cath removal  03/12/2012    Procedure: MINOR REMOVAL PORT-A-CATH;  Surgeon: Adin Hector, MD;  Location: [place];  Service: General;  Laterality: N/A;  Upper left  . Breast lumpectomy with needle localization Right 05/05/2013    Procedure: EXCISION RECURRENT CANCER RIGHT BREAST WITH NEEDLE LOCALOZATION;  Surgeon: Adin Hector, MD;  Location: [place];  Service: General;  Laterality: Right;  . Portacath placement Left 05/05/2013    Procedure: INSERTION PORT-A-CATH;  Surgeon: Adin Hector, MD;  Location: [place];  Service: General;  Laterality: Left;    FAMILY HISTORY Family History  Problem Relation Age of Onset  . Adopted: Yes  . Heart attack Father   . Cancer Cousin 40    melanoma; mat first cousin, located on neck  . Cancer Maternal Aunt 60    stomach and ovarian as separate  primaries  . Cancer Maternal Uncle 65    lung; smoker  . Cancer Maternal Grandmother 3    enodometrial  . Cancer Maternal Aunt 55    breast  . Cancer Maternal Uncle 63    prostate  :  The patient's biological father died at the age of 32 from myocardial infarction.  The patient's biological mother is alive at age 52.  The patient was adopted by her biological mother's parents.  She has 3 half-brothers.  No sisters.  One cousin on her mother's side died from melanoma at the age of 55.  There is other cancer history in the adopted side but these are not even half-brothers or sisters, they are children of her adopted parents who are really her grandparents so these would be uncles and aunts (1 lymph node cancer, 1 prostate cancer, 1 breast cancer at the age of 22).  GYNECOLOGIC HISTORY:   (Updated 05/22/2013) She had menarche age 43 or 15.  She is GX, P1, first pregnancy to term  at age 5. She stopped having periods approximately March 2013.  Hormone levels in February 2015 show patient to be premenopausal, with an estradiol of 113 on 05/15/2013, and her periods indeed resumed February of 2015  SOCIAL HISTORY: (Updated 06/12/2013) She works in a Engineer, agricultural.  Her husband, Ericha Whittingham owns a family business called [employer].  They have been married 5 years.  They have a son, Kellie Simmering, age 40. Romilda Joy also has a daughter, Marysol Wellnitz, who lives in [place] and is 44 years old.  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  The patient is not a church attender.    ADVANCED DIRECTIVES: Not in place  HEALTH MAINTENANCE:  (Updated 06/12/2013) History  Substance Use Topics  . Smoking status: Former Smoker    Quit date: 07/20/1996  . Smokeless tobacco: Never Used  . Alcohol Use: No     Colonoscopy:  Never  PAP: Oct 2014/[facility]  Bone density: Oct 2014, mild osteopenia with a T score of -1.1  Lipid panel: Not on file  Allergies  Allergen Reactions  . Tussionex Pennkinetic Er [Hydrocod Polst-Cpm Polst Er] Other (See Comments)    halucinations  . Codeine Other (See Comments)    unkown  . Morphine Rash  . Morphine And Related Anxiety    anxiety  . Penicillin G Rash  . Penicillins Rash    Current Outpatient Prescriptions  Medication Sig Dispense Refill  . aspirin-acetaminophen-caffeine (EXCEDRIN MIGRAINE) 250-250-65 MG per tablet Take 2 tablets by mouth every 6 (six) hours as needed for headache.      . b complex vitamins tablet Take 1 tablet by mouth daily.      . Biotin 5000 MCG CAPS Take 5,000 mcg by mouth daily.      . Calcium Carbonate-Vitamin D (CALCIUM-VITAMIN D) 500-200 MG-UNIT per tablet Take 1 tablet by mouth 2 (two) times daily with a meal.      . chlorpheniramine (ALLERGY) 4 MG tablet Take by mouth.      . doxycycline (VIBRA-TABS) 100 MG tablet Take 1 tablet (100 mg total) by mouth 2 (two) times daily.  30 tablet  6  . fluconazole (DIFLUCAN)  150 MG tablet       . HYDROcodone-acetaminophen (NORCO/VICODIN) 5-325 MG per tablet       . ibuprofen (ADVIL,MOTRIN) 800 MG tablet Take 1 tablet (800 mg total) by mouth every 8 (eight) hours as needed.  60 tablet  1  . lidocaine-prilocaine (EMLA)  cream Apply cream to port 1-2hours prior to procedure  30 g  2  . LORazepam (ATIVAN) 0.5 MG tablet Take 1 tablet (0.5 mg total) by mouth 2 (two) times daily as needed for anxiety.  60 tablet  0  . Multiple Vitamins-Minerals (HAIR/SKIN/NAILS PO) Take 1 tablet by mouth daily.       . Omega-3 1000 MG CAPS Take 2 g by mouth daily.      . ondansetron (ZOFRAN) 8 MG tablet Take 1 tablet (8 mg total) by mouth 2 (two) times daily as needed for nausea or vomiting.  20 tablet  1  . prenatal vitamin w/FE, FA (PRENATAL 1 + 1) 27-1 MG TABS Take 1 tablet by mouth Daily.      . prochlorperazine (COMPAZINE) 5 MG tablet Take 1 tablet (5 mg total) by mouth every 6 (six) hours as needed for nausea or vomiting.  30 tablet  0  . SUMAtriptan (IMITREX) 50 MG tablet Take 1 tablet (50 mg total) by mouth every 2 (two) hours as needed for migraine or headache. May repeat in 2 hours if headache persists or recurs.  10 tablet  0  . traMADol (ULTRAM) 50 MG tablet TAKE ONE TABLET BY MOUTH EVERY 6 HOURS AS NEEDED FOR MODERATE PAIN  30 tablet  0   No current facility-administered medications for this visit.    OBJECTIVE: Middle-aged white woman who appears well Filed Vitals:   10/30/13 1335  BP: 95/76  Pulse: 78  Temp: 98.4 F (36.9 C)  Resp: 20     Body mass index is 22.24 kg/(m^2).    ECOG FS: 1 Filed Weights   10/30/13 1335  Weight: 125 lb 8 oz (56.926 kg)   Sclerae unicteric, pupils round and reactive  Oropharynx clear and moist--  teeth in good repair  No cervical or supraclavicular adenopathy Lungs no rales or rhonchi Heart regular rate and rhythm Abd soft, nontender, positive bowel sounds--no swelling is apparent, no masses are palpated MSK no focal spinal  tenderness, no upper extremity lymphedema Neuro: nonfocal, well oriented,  positive  affect Breasts: Status post bilateral mastectomies with implant reconstruction. there are mild irregularities over her the reconstructed breasts, but nothing suggestive of recurrent local disease.. Both axillae are benign.  LAB RESULTS: Lab Results  Component Value Date   WBC 5.9 10/30/2013   NEUTROABS 3.4 10/30/2013   HGB 12.9 10/30/2013   HCT 39.8 10/30/2013   MCV 89.9 10/30/2013   PLT 168 10/30/2013      Chemistry      Component Value Date/Time   NA 143 10/09/2013 1238   NA 142 05/05/2013 1059   K 3.9 10/09/2013 1238   K 5.0 05/05/2013 1059   CL 103 05/05/2013 1059   CL 104 09/23/2012 1415   CO2 28 10/09/2013 1238   CO2 22 05/05/2013 1059   BUN 16.3 10/09/2013 1238   BUN 11 05/05/2013 1059   CREATININE 0.9 10/09/2013 1238   CREATININE 0.70 05/05/2013 1059   CREATININE 0.77 01/08/2013 1456      Component Value Date/Time   CALCIUM 10.0 10/09/2013 1238   CALCIUM 9.1 05/05/2013 1059   ALKPHOS 87 10/09/2013 1238   ALKPHOS 47 01/08/2013 1456   AST 65* 10/09/2013 1238   AST 18 01/08/2013 1456   ALT 54 10/09/2013 1238   ALT 13 01/08/2013 1456   BILITOT 0.39 10/09/2013 1238   BILITOT 0.5 01/08/2013 1456       STUDIES: No results found.  ASSESSMENT: 43 y.o.  BRCA 1-2 negative [name], [place] woman,   (1) status post right breast biopsy in September 2012 for a clinically 2.0 cm invasive ductal carcinoma, with some enlarged Right axillary lymph nodes, the largest one of which was negative by biopsy. The tumor was grade 2, estrogen receptor positive at 88%, progesterone receptor positive at 11%, HER-2/neu positive by CISH with a ratio of 4.61, and a proliferation marker of 36%.   (2) treated in the neoadjuvant setting with Q 3 week docetaxel/ carboplatin/ trastuzumab x6, completed 05/18/2011.  Continued on trastuzumab every 3 weeks to complete one year, last dose in November 2013.  (3) s/p bilateral mastectomies with  sentinel node biopsy 06/29/2011 for a residual right-sided ypT1c ypN0 invasive ductal carcinoma, grade 2, with immediate expander placement  (4) on tamoxifen as of May 2013, discontinued in early 2015 due to breast cancer recurrence   (5) status post left lumpectomy with left-sided sentinel lymph node sampling 05/05/2013 for a pT1b pN0, stage IA invasive ductal carcinoma, estrogen receptor 94% positive, progesterone receptor 11% positive, with an MIB-1 of 19% and HER-2 amplification  (6)  Received TDM-1 every 3 weeks x 8 doses completed 10/09/2013; echo 08/25/2013 showed a well preserved ejection fraction  (7) to start radiation therapy  (8) to start anastrozole with or without goserelin at the completion of radiation therapy  (9) 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   PLAN:  Amilah Greenspan tolerated the TDM 1 well. The next step is radiation, but she is very reluctant to proceed. She worries about damage to the lung, capsule formation, and other possible complications she has been reading about. She wants to know exactly what her risk of recurrence locally would be without and with radiation. These are very legitimate concerns and good questions. She is going to be meeting with Dr. Valere Dross within the next week or so to discuss them further. However I reassured her that if anything is standard treatment for local recurrence only, the optimization of local treatment with radiation after surgical resection is standard.  What to do regarding systemic therapy is less clear. I do not know that any more TDM one would be better, although is going on to trastuzumab or trastuzumab plus pertuzumab would be advisable. She has been reading the pertuzumab side effects and is very much against receiving the drug. Her vote would be to stop the anti-HER-2 treatment and remove the port. I do not have data that proceeding with further anti-HER-2 treatment in  the absence of any measurable disease after 8 cycles of TDM 1 is useful. Accordingly we are not proceeding with trastuzumab or pertuzumab and the port may be removed at her convenience.  We then started discussing antiestrogen therapy. She had a period of 4 months ago but had not had a period a year before that. We're going to check [facility] and estradiol levels before the next visit, and as soon as she finishes radiation we will start anastrozole. We will discuss starting go several and. The alternative would be to follow the [facility] and estradiol levels closely at regular intervals.  Brittie Whisnant is having insomnia and anxiety issues, and she is using lorazepam fairly frequently. I suggestedI  she might benefit from low dose venlafaxine both in terms of the hot flashes and in terms of anxiety control. I wrote her a prescription for the 37.5 dose.   We spent approximately 1 hour going over her the patient's questions and she has  a very good understanding of the overall plan, as well as the possible toxicities, side effects and complications of her treatments. She knows the goal of treatment in her case is cure. She will call with any problems that may develop before her next visit here .  Chauncey Cruel, MD     10/30/2013

## 2013-10-31 ENCOUNTER — Telehealth (INDEPENDENT_AMBULATORY_CARE_PROVIDER_SITE_OTHER): Payer: Self-pay | Admitting: General Surgery

## 2013-10-31 ENCOUNTER — Other Ambulatory Visit: Payer: BC Managed Care – PPO

## 2013-10-31 NOTE — Telephone Encounter (Signed)
Spoke with pt and informed her that Dr. Jana Hakim gave [place] permission to remove her PAC.  Informed her she could do this under local or with sedation.  The patient opted for local and would preferably like it scheduled in early afternoon

## 2013-10-31 NOTE — Addendum Note (Signed)
Addended by: Amelia Jo I on: 10/31/2013 01:58 PM   Modules accepted: Orders

## 2013-11-05 ENCOUNTER — Telehealth: Payer: Self-pay

## 2013-11-05 ENCOUNTER — Telehealth: Payer: Self-pay | Admitting: *Deleted

## 2013-11-05 NOTE — Telephone Encounter (Signed)
VM call given to TF/RN per her previous call.

## 2013-11-05 NOTE — Telephone Encounter (Signed)
Returned pt call re: port and treatment options.  Pt advises she would like to discuss herceptin further with Dr Jana Hakim.  I let her know I would give him the message.  Next appointment is September 14.

## 2013-11-05 NOTE — Telephone Encounter (Signed)
LMOVM - returned pt call about port.  Let her know GM is ok with removing it and she will need to call the surgeon's office to schedule it.   Pt to call clinic if she needs more info.

## 2013-11-06 ENCOUNTER — Ambulatory Visit
Admission: RE | Admit: 2013-11-06 | Discharge: 2013-11-06 | Disposition: A | Discharge status: Home / Self Care | Payer: BC Managed Care – PPO | Source: Ambulatory Visit | Attending: Radiation Oncology | Admitting: Radiation Oncology

## 2013-11-06 ENCOUNTER — Encounter: Payer: Self-pay | Admitting: Radiation Oncology

## 2013-11-06 ENCOUNTER — Encounter (INDEPENDENT_AMBULATORY_CARE_PROVIDER_SITE_OTHER): Payer: Self-pay | Admitting: General Surgery

## 2013-11-06 ENCOUNTER — Ambulatory Visit (INDEPENDENT_AMBULATORY_CARE_PROVIDER_SITE_OTHER): Payer: BC Managed Care – PPO | Admitting: General Surgery

## 2013-11-06 VITALS — BP 96/65 | HR 96 | Temp 98.1°F | Ht 63.0 in | Wt 126.1 lb

## 2013-11-06 VITALS — BP 94/64 | HR 73 | Temp 98.1°F | Resp 12 | Ht 63.0 in | Wt 124.2 lb

## 2013-11-06 DIAGNOSIS — Z9221 Personal history of antineoplastic chemotherapy: Secondary | ICD-10-CM | POA: Insufficient documentation

## 2013-11-06 DIAGNOSIS — C50319 Malignant neoplasm of lower-inner quadrant of unspecified female breast: Secondary | ICD-10-CM | POA: Diagnosis not present

## 2013-11-06 DIAGNOSIS — Z9010 Acquired absence of unspecified breast and nipple: Secondary | ICD-10-CM | POA: Insufficient documentation

## 2013-11-06 DIAGNOSIS — Z510 Encounter for antineoplastic radiation therapy: Secondary | ICD-10-CM | POA: Diagnosis present

## 2013-11-06 DIAGNOSIS — Y842 Radiological procedure and radiotherapy as the cause of abnormal reaction of the patient, or of later complication, without mention of misadventure at the time of the procedure: Secondary | ICD-10-CM | POA: Diagnosis not present

## 2013-11-06 DIAGNOSIS — C50911 Malignant neoplasm of unspecified site of right female breast: Secondary | ICD-10-CM

## 2013-11-06 DIAGNOSIS — L589 Radiodermatitis, unspecified: Secondary | ICD-10-CM | POA: Insufficient documentation

## 2013-11-06 DIAGNOSIS — Z452 Encounter for adjustment and management of vascular access device: Secondary | ICD-10-CM

## 2013-11-06 DIAGNOSIS — Z170 Estrogen receptor positive status [ER+]: Secondary | ICD-10-CM | POA: Insufficient documentation

## 2013-11-06 DIAGNOSIS — C50311 Malignant neoplasm of lower-inner quadrant of right female breast: Secondary | ICD-10-CM

## 2013-11-06 DIAGNOSIS — F411 Generalized anxiety disorder: Secondary | ICD-10-CM | POA: Insufficient documentation

## 2013-11-06 DIAGNOSIS — C50919 Malignant neoplasm of unspecified site of unspecified female breast: Secondary | ICD-10-CM

## 2013-11-06 MED ORDER — TRAMADOL HCL 50 MG PO TABS: 50.0000 mg | ORAL_TABLET | Freq: Four times a day (QID) | ORAL | Status: DC | PRN

## 2013-11-06 NOTE — Patient Instructions (Signed)
Your left-sided Port-A-Cath was removed today, and the procedure went well.  The Dermabond glue should wear off in about 3 weeks.  Keep an ice pack on this intermittently for the next 24 hours while awake  You may shower starting tomorrow  Do not get into a swimming pool or hot tub for a couple of weeks.  Return to see Dr. Dalbert Batman in about 4 months.

## 2013-11-06 NOTE — Progress Notes (Signed)
Patient ID: [name], female   DOB: 1970/05/21, 43 y.o.   MRN: [id number]  History: [name] has completed her chemotherapy. She had an extensive discussion was Dr. Jana Hakim about whether to continue targeted therapy with monoclonal antibodies. Indeed she decided against that. Dr. Magrinat/note states she may have her port removed. She called more to do this as soon as possible before she started her radiation therapy. She elected to have this done under local anesthesia in the office.  Interval history is summarized below: This patient underwent excision of recurrent breast cancer right breast, lower inner quadrant with needle  localization, as well as insertion of Port-A-Cath on 05/05/2013. We made a significant elliptical incision and had to take the dissection all the way down to the implant capsule. The pectoralis muscle was extraordinarily paper thin.  The tumor was invasive ductal carcinoma, grade 2 or 3, 7 mm diameter. Posterior margin was 1 mm. Other margins more widely negative. Receptor positive, HER-2 positive. She states that  Dr. Valere Dross is going to radiate this area.  Exam: Patient is alert and pleasant and in no distress. Port is palpable in the left infraclavicular area. Catheter enters the left subclavian vein. Right breast incision is well healed. No palpable abnormality locally.  Procedure: Patient was taken to the minor procedure room placed supine. Left upper chest prepped and draped with Betadine. 1% Xylocaine with epinephrine local. Transverse incision made through the old scar. Port dissected away from the capsule. All 3 Prolene sutures cut and catheter and port removed intact. Minimal bleeding. Subcutaneous tissue closed with 3-0 Vicryl and skin closed with running subcuticular 4-0 Monocryl and Dermabond. Tolerated well.  Assessment:    Port-A-Cath removed today    Local recurrence, right breast cancer, 3:00 position. This is a small subcentimeter recurrence interposed  between the skin and the implant capsule. There is no evidence of cancer elsewhere. Invasive ductal carcinoma, receptor positive and HER-2 positive. Recovering uneventfully following wide local excision with radial ellipse.     History of locally advanced cancer right breast(diagnosis Sept., 2012), status post neoadjuvant chemotherapy, subsequent bilateral mastectomy(06/29/2011) and right sentinel node biopsy for invasive ductal carcinoma, receptor positive, HER-2 positive, BRCA negative. Pathologic stage ypT1c, ypN0 with negative margins.      Status post  Herceptin chemotherapy      Status post multiple plastic surgical procedures as above, resulting in excellent cosmetic result.   Plan: Okay to proceed with radiation therapy Wound care discussed Tramadol, 20 tablets, given the patient at her request  return to see me in 4 months   [name], M.D., [facility] General and Minimally invasive Surgery Breast and Colorectal Surgery Office:   [phone] Pager:   [phone]

## 2013-11-06 NOTE — Progress Notes (Signed)
Location of Breast Cancer Right breast lower inner quadrant 4 o'clock 04/09/13   1st dx 12/23/10: Breast, right, needle core biopsy, mass 5 o'clock  - INVASIVE DUCTAL CARCINOMA, GRADE II  Histology per Pathology Report: Diagnosis 04/09/13:  Breast, right, needle core biopsy, 4 o'clock- INVASIVE DUCTAL CARCINOMA.    Receptor Status: ER(94% ), PR (11% ), Her2-neu (3.75%) BRCA neg. K1-67(19%)  Did patient present with symptoms (if so, please note symptoms) or was this found on screening mammography?:   Past/Anticipated interventions by surgeon, if any: 05/05/13 Diagnosis Breast, lumpectomy, Right - INVASIVE DUCTAL CARCINOMA GRADE II/III, SPANNING 0.7 CM. - INVASIVE CARCINOMA IS 0.1 CM TO THE POSTERIOR MARGIN.   Diagnosis 06/29/11:  1. Breast, simple mastectomy, Left- BENIGN FIBROCYSTIC CHANGES.- NO MALIGNANCY IDENTIFIED. ONE BENIGN LYMPH NODE (0/1).  2. Breast, simple mastectomy, Right- INVASIVE AND IN SITU DUCTAL CARCINOMA, 1.2 CM, ASSOCIATED WITH FIBROSIS.- MARGIN NOT INVOLVED.3. Lymph node, sentinel, biopsy, Right axillary- ONE BENIGN LYMPH NODE (0/1).4. Lymph node, sentinel, biopsy, Right axillary #2- ONE BENIGN LYMPH NODE (0/1).5. Lymph node, sentinel, biopsy, Right axillary #3- ONE BENIGN LYMPH NODE (0/1).   Past/Anticipated interventions by medical oncology, if any: ) treated in the neoadjuvant setting with Q 3 week docetaxel/ carboplatin/ trastuzumab x6, completed 05/18/2011. Continued on trastuzumab every 3 weeks to complete one year, last dose in November 2013.  (3) s/p bilateral mastectomies with sentinel node biopsy 06/29/2011 for a residual right-sided ypT1c ypN0 invasive ductal carcinoma, grade 2, with immediate expander placement  (4) on tamoxifen as of May 2013, discontinued in early 2015 due to breast cancer recurrence  (5) status post left lumpectomy with left-sided sentinel lymph node sampling 05/05/2013 for a pT1b pN0, stage IA invasive ductal carcinoma, estrogen receptor 94%  positive, progesterone receptor 11% positive, with an MIB-1 of 19% and HER-2 amplification  (6) Received TDM-1 every 3 weeks x 8 doses completed 10/09/2013; echo 08/25/2013 showed a well preserved ejection fraction  (7) to start radiation therapy  (8) to start anastrozole with or without goserelin at the completion of radiation therapy  Lymphedema issues, if any: no   Pain issues, if any:   SAFETY ISSUES:  Prior radiation? no  Pacemaker/ICD? no  Possible current pregnancy? no  Is the patient on methotrexate? No  Current Complaints / other details: Father MI, brother prostate and lung ca, 1 sister breast ca, 90's deceased, 2nd sister stomach ovarian ca, deceased, 67, 1 cousin Female, melanoma

## 2013-11-06 NOTE — Progress Notes (Signed)
CC: Dr. Gunnar Bulla Magrinat, Dr. Fanny Skates  Followup note:  [name] is a pleasant 43 year old female who is seen today for review and scheduling of her right chest wall/reconstructed breast radiation therapy in the management of her recurrent invasive ductal carcinoma the right breast. I saw her in consultation back in 04/24/2013. She presented with a palpable right breast mass at approximately 5:00, 5 cm from the nipple. Ultrasound showed a 1.7 cm mass. There was a 1.8 cm right axillary lymph node with a thickened cortex. Right breast biopsy on 12/23/2010 was diagnostic for invasive ductal carcinoma Right axillary node biopsy was negative. She will on to receive neoadjuvant chemotherapy followed by bilateral mastectomies and right sentinel lymph node biopsies. She had a 1.2 cm invasive and in situ ductal carcinoma within the right breast and 3 sentinel lymph nodes were free of metastatic disease. Her primary tumor was ER positive at 80%, PR positive at 11% with an elevated Ki-67 of 36% along with HER-2/neu positivity. She had placement of tissue expanders by Dr. Migdalia Dk and placement of permanent implants on 11/23/2011. She will onto receive adjuvant chemotherapy through Dr. Jana Hakim which included Herceptin. She later underwent revision of her implants with Dr. [name] in [place] with nipple reconstruction and fat injections. Followup breast MR in September was without evidence for recurrent disease. A PET scan was performed on 04/07/2013 and this showed a small mildly hypermetabolic subcutaneous nodule medially in the right breast at approximately 4 to 5:00. A subsequent ultrasound showed an indeterminate nodule in the 4:00 position measuring 0.7 x 0.5 x 0.5 cm. An ultrasound-guided biopsy on 04/09/2013 was diagnostic for invasive ductal carcinoma, ER positive at 94%, PR positive at 11% with a proliferation marker of 19%, and also HER-2/neu positive. On 05/05/2013 she underwent excision of the  right chest wall mass confirming invasive ductal carcinoma measuring 0.7 cm within 1 mm of the posterior margin showing skeletal muscle. She went on to receive adjuvant chemotherapy with Dr. Jana Hakim and completed 8 cycles of TDM-1. She completed her chemotherapy and will start anastrozole following completion of radiation therapy. She saw Dr. Dalbert Batman her today and he removed her Port-A-Cath.  Physical examination: Alert and oriented. Filed Vitals:   11/06/13 1613  BP: 96/65  Pulse: 96  Temp: 98.1 F (36.7 C)   Head and neck examination: Grossly unremarkable. Nodes: There is no palpable cervical, supraclavicular, or axillary lymphadenopathy. Chest: Left Port-A-Cath wound along the upper left anterior chest. Lungs are clear. Breasts: Bilateral implant reconstructions. There is a radial scar along the right breast at 4:00. No masses are appreciated. Abdomen: Without hepatomegaly. Extremities: Without edema.  Laboratory data: Lab Results  Component Value Date   WBC 5.9 10/30/2013   HGB 12.9 10/30/2013   HCT 39.8 10/30/2013   MCV 89.9 10/30/2013   PLT 168 10/30/2013   Impression:  Recurrent invasive ductal carcinoma of the right breast with subcutaneous recurrence along the medial aspect. As we discussed previously, I recommend radiation therapy to her right chest wall/reconstructed breast and lower axilla with "high tangents". We discussed the potential acute and late toxicities including capsular fibrosis and even the need for removal of her right breast implant. There is a small but real risk for lymphedema. Consent is signed today.  Plan: She will return for simulation/treatment planning early next week and begin her treatment in mid-August.  45 minutes was spent face-to-face with the patient, primarily counseling the patient and coordinating her care.

## 2013-11-07 NOTE — Addendum Note (Signed)
Encounter addended by: Deirdre Evener, RN on: 11/07/2013 11:02 AM<BR>     Documentation filed: Charges VN

## 2013-11-13 ENCOUNTER — Ambulatory Visit
Admission: RE | Admit: 2013-11-13 | Discharge: 2013-11-13 | Disposition: A | Discharge status: Home / Self Care | Payer: BC Managed Care – PPO | Source: Ambulatory Visit | Attending: Radiation Oncology | Admitting: Radiation Oncology

## 2013-11-13 DIAGNOSIS — Z510 Encounter for antineoplastic radiation therapy: Secondary | ICD-10-CM | POA: Diagnosis not present

## 2013-11-13 DIAGNOSIS — C50311 Malignant neoplasm of lower-inner quadrant of right female breast: Secondary | ICD-10-CM

## 2013-11-13 NOTE — Progress Notes (Addendum)
Complex simulation/treatment planning note: The patient was taken to the CT simulator. A custom vac lock was constructed on a custom breast board for immobilization. Her right breast field borders were marked with radiopaque wires. Her biopsy scar was marked with a radiopaque wire. Her electron beam field was also marked with a radiopaque wire for future planning. The CT data set was sent to the planning system for contouring of her normal anatomy. She was set up to medial and lateral right breast high tangents and 2 separate multileaf collimators were designed. I prescribing 4500 cGy in 25 sessions utilizing 6 MV photons. 1.0 cm bolus will be utilized of your the day to increase the dose to the skin surface. She'll have construction of 1.0 cm bolus on the first day of her treatment. She is now ready for 3-D simulation. She will undergo daily AlignRT. Following her photon irradiation she will undergo electron beam boost for an additional 1440 cGy in 8 sessions. She'll be treated with 6 MEV electrons along with 1.0 cm of custom daily bolus.

## 2013-11-14 ENCOUNTER — Encounter: Payer: Self-pay | Admitting: Radiation Oncology

## 2013-11-14 DIAGNOSIS — Z510 Encounter for antineoplastic radiation therapy: Secondary | ICD-10-CM | POA: Diagnosis not present

## 2013-11-14 NOTE — Progress Notes (Signed)
3-D simulation note: The patient completed 3-D simulation for treatment to her right breast. She was set up to medial and lateral tangential fields. Each tangent had one fixed beam with a custom multileaf collimator and also 1 electronic compensation field for a total for complex treatment devices. She'll have construction of 1.0 cm custom bolus the first ever treatment. I'm prescribing 4500 cGy 25 sessions utilizing 6 MV photons. I am requesting daily along RT for image guidance.

## 2013-11-17 DIAGNOSIS — Z510 Encounter for antineoplastic radiation therapy: Secondary | ICD-10-CM | POA: Diagnosis not present

## 2013-11-20 ENCOUNTER — Ambulatory Visit: Payer: BC Managed Care – PPO | Admitting: Radiation Oncology

## 2013-11-20 ENCOUNTER — Ambulatory Visit
Admission: RE | Admit: 2013-11-20 | Discharge: 2013-11-20 | Disposition: A | Discharge status: Home / Self Care | Payer: BC Managed Care – PPO | Source: Ambulatory Visit | Attending: Radiation Oncology | Admitting: Radiation Oncology

## 2013-11-20 DIAGNOSIS — Z510 Encounter for antineoplastic radiation therapy: Secondary | ICD-10-CM | POA: Diagnosis not present

## 2013-11-24 ENCOUNTER — Ambulatory Visit
Admission: RE | Admit: 2013-11-24 | Discharge: 2013-11-24 | Disposition: A | Discharge status: Home / Self Care | Payer: BC Managed Care – PPO | Source: Ambulatory Visit | Attending: Radiation Oncology | Admitting: Radiation Oncology

## 2013-11-24 ENCOUNTER — Encounter: Payer: Self-pay | Admitting: Radiation Oncology

## 2013-11-24 VITALS — BP 101/69 | HR 85 | Temp 97.8°F | Resp 20 | Wt 124.9 lb

## 2013-11-24 DIAGNOSIS — Z510 Encounter for antineoplastic radiation therapy: Secondary | ICD-10-CM | POA: Diagnosis not present

## 2013-11-24 DIAGNOSIS — C50311 Malignant neoplasm of lower-inner quadrant of right female breast: Secondary | ICD-10-CM

## 2013-11-24 MED ORDER — ALRA NON-METALLIC DEODORANT (RAD-ONC)
1.0000 "application " | Freq: Once | TOPICAL | Status: AC
  Administered 2013-11-24: 1 via TOPICAL

## 2013-11-24 MED ORDER — RADIAPLEXRX EX GEL
Freq: Once | CUTANEOUS | Status: AC
  Administered 2013-11-24: 16:00:00 via TOPICAL

## 2013-11-24 NOTE — Progress Notes (Signed)
Weekly Management Note:  Site: Right chest wall/requested breast Current Dose:  180  cGy Projected Dose: 4680  cGy  Narrative: The patient is seen today for routine under treatment assessment. CBCT/MVCT images/port films were reviewed. The chart was reviewed.   She is without complaints today. She is concerned that her set up sternal marked may be slightly off, and I reviewed her pretreatment port films. I explained her that there can be a slight shift based on the optical guidance. She has Radioplex gel to use when necessary.  Physical Examination:  Filed Vitals:   11/24/13 1523  BP: 101/69  Pulse: 85  Temp: 97.8 F (36.6 C)  Resp: 20  .  Weight: 124 lb 14.4 oz (56.654 kg). No skin changes.  Impression: Tolerating radiation therapy well.  Plan: Continue radiation therapy as planned.

## 2013-11-24 NOTE — Progress Notes (Addendum)
Patient denies pain, loss of appetite. She reports fatigue. Patient education completed w/pt. Gave pt "Radiation and You" booklet w/all pertinent information marked and discussed, re: fatigue, skin irritation/care, nutrition, pain. Gave pt Radiaplex, Alra with instructions for proper use. All questions answered.  Pt verbalized understanding.

## 2013-11-25 ENCOUNTER — Ambulatory Visit
Admission: RE | Admit: 2013-11-25 | Discharge: 2013-11-25 | Disposition: A | Discharge status: Home / Self Care | Payer: BC Managed Care – PPO | Source: Ambulatory Visit | Attending: Radiation Oncology | Admitting: Radiation Oncology

## 2013-11-25 ENCOUNTER — Ambulatory Visit: Payer: BC Managed Care – PPO

## 2013-11-25 ENCOUNTER — Ambulatory Visit: Payer: BC Managed Care – PPO | Admitting: Radiation Oncology

## 2013-11-25 DIAGNOSIS — Z510 Encounter for antineoplastic radiation therapy: Secondary | ICD-10-CM | POA: Diagnosis not present

## 2013-11-26 ENCOUNTER — Ambulatory Visit
Admission: RE | Admit: 2013-11-26 | Discharge: 2013-11-26 | Disposition: A | Discharge status: Home / Self Care | Payer: BC Managed Care – PPO | Source: Ambulatory Visit | Attending: Radiation Oncology | Admitting: Radiation Oncology

## 2013-11-26 DIAGNOSIS — Z510 Encounter for antineoplastic radiation therapy: Secondary | ICD-10-CM | POA: Diagnosis not present

## 2013-11-27 ENCOUNTER — Ambulatory Visit
Admission: RE | Admit: 2013-11-27 | Discharge: 2013-11-27 | Disposition: A | Discharge status: Home / Self Care | Payer: BC Managed Care – PPO | Source: Ambulatory Visit | Attending: Radiation Oncology | Admitting: Radiation Oncology

## 2013-11-27 DIAGNOSIS — Z510 Encounter for antineoplastic radiation therapy: Secondary | ICD-10-CM | POA: Diagnosis not present

## 2013-11-28 ENCOUNTER — Ambulatory Visit
Admission: RE | Admit: 2013-11-28 | Discharge: 2013-11-28 | Disposition: A | Discharge status: Home / Self Care | Payer: BC Managed Care – PPO | Source: Ambulatory Visit | Attending: Radiation Oncology | Admitting: Radiation Oncology

## 2013-11-28 DIAGNOSIS — Z510 Encounter for antineoplastic radiation therapy: Secondary | ICD-10-CM | POA: Diagnosis not present

## 2013-12-01 ENCOUNTER — Other Ambulatory Visit: Payer: Self-pay | Admitting: Oncology

## 2013-12-01 ENCOUNTER — Ambulatory Visit
Admission: RE | Admit: 2013-12-01 | Discharge: 2013-12-01 | Disposition: A | Discharge status: Home / Self Care | Payer: BC Managed Care – PPO | Source: Ambulatory Visit | Attending: Radiation Oncology | Admitting: Radiation Oncology

## 2013-12-01 VITALS — BP 93/63 | HR 65 | Temp 98.2°F | Resp 20 | Wt 125.2 lb

## 2013-12-01 DIAGNOSIS — C50319 Malignant neoplasm of lower-inner quadrant of unspecified female breast: Secondary | ICD-10-CM

## 2013-12-01 DIAGNOSIS — Z510 Encounter for antineoplastic radiation therapy: Secondary | ICD-10-CM | POA: Diagnosis not present

## 2013-12-01 DIAGNOSIS — C50311 Malignant neoplasm of lower-inner quadrant of right female breast: Secondary | ICD-10-CM

## 2013-12-01 NOTE — Progress Notes (Signed)
Patient states that last Tues she noticed a bruised area on her posterior right forearm and tingling down her forearm to her hand. Pt has small healing bruise just below her right elbow. She states the tingling has resolved. She states that she remembers feeling some pressure on that area of her arm while in the form during radiation treatment. She states today she readjusted her arm until she felt no pressure. Suggested that this was most likely the cause of the bruise and tingling in her right forearm but advised her to inform this RN if it reoccurs. Pt states she felt some nausea last week, advised this was not an expected side effect from radiation to her breast. Advised she may take OTC antiemetic if this returned and to inform RN. Pt applying Radiaplex to right breast for some pinkness of skin. She is fatigued.

## 2013-12-01 NOTE — Progress Notes (Signed)
Weekly Management Note:  Site: Right breast Current Dose:  1080  cGy Projected Dose: 4500  CGy followed by 8 fraction boost  Narrative: The patient is seen today for routine under treatment assessment. CBCT/MVCT images/port films were reviewed. The chart was reviewed.   She is without complaints today. She does have a bruise along her right forearm, presumably from trauma from her Vaculoc immobilization device. She uses Radioplex gel.  Physical Examination:  Filed Vitals:   12/01/13 1452  BP: 93/63  Pulse: 65  Temp: 98.2 F (36.8 C)  Resp: 20  .  Weight: 125 lb 3.2 oz (56.79 kg). Very faint erythema the skin along the right breast with no areas of desquamation.  Impression: Tolerating radiation therapy well.  Plan: Continue radiation therapy as planned.

## 2013-12-02 ENCOUNTER — Telehealth (INDEPENDENT_AMBULATORY_CARE_PROVIDER_SITE_OTHER): Payer: Self-pay

## 2013-12-02 ENCOUNTER — Ambulatory Visit
Admission: RE | Admit: 2013-12-02 | Discharge: 2013-12-02 | Disposition: A | Discharge status: Home / Self Care | Payer: BC Managed Care – PPO | Source: Ambulatory Visit | Attending: Radiation Oncology | Admitting: Radiation Oncology

## 2013-12-02 DIAGNOSIS — Z510 Encounter for antineoplastic radiation therapy: Secondary | ICD-10-CM | POA: Diagnosis not present

## 2013-12-02 NOTE — Telephone Encounter (Signed)
Patient states she is not needing to take tramadol for discomfort her pain is much better, she may take tylenol occasionally.

## 2013-12-03 ENCOUNTER — Ambulatory Visit
Admission: RE | Admit: 2013-12-03 | Discharge: 2013-12-03 | Disposition: A | Discharge status: Home / Self Care | Payer: BC Managed Care – PPO | Source: Ambulatory Visit | Attending: Radiation Oncology | Admitting: Radiation Oncology

## 2013-12-03 DIAGNOSIS — Z510 Encounter for antineoplastic radiation therapy: Secondary | ICD-10-CM | POA: Diagnosis not present

## 2013-12-04 ENCOUNTER — Ambulatory Visit
Admission: RE | Admit: 2013-12-04 | Discharge: 2013-12-04 | Disposition: A | Discharge status: Home / Self Care | Payer: BC Managed Care – PPO | Source: Ambulatory Visit | Attending: Radiation Oncology | Admitting: Radiation Oncology

## 2013-12-04 DIAGNOSIS — Z510 Encounter for antineoplastic radiation therapy: Secondary | ICD-10-CM | POA: Diagnosis not present

## 2013-12-04 DIAGNOSIS — C50311 Malignant neoplasm of lower-inner quadrant of right female breast: Secondary | ICD-10-CM

## 2013-12-04 MED ORDER — BIAFINE EX EMUL
Freq: Two times a day (BID) | CUTANEOUS | Status: DC
  Administered 2013-12-04: 16:00:00 via TOPICAL

## 2013-12-05 ENCOUNTER — Ambulatory Visit
Admission: RE | Admit: 2013-12-05 | Discharge: 2013-12-05 | Disposition: A | Discharge status: Home / Self Care | Payer: BC Managed Care – PPO | Source: Ambulatory Visit | Attending: Radiation Oncology | Admitting: Radiation Oncology

## 2013-12-05 DIAGNOSIS — Z510 Encounter for antineoplastic radiation therapy: Secondary | ICD-10-CM | POA: Diagnosis not present

## 2013-12-08 ENCOUNTER — Ambulatory Visit
Admission: RE | Admit: 2013-12-08 | Discharge: 2013-12-08 | Disposition: A | Discharge status: Home / Self Care | Payer: BC Managed Care – PPO | Source: Ambulatory Visit | Attending: Radiation Oncology | Admitting: Radiation Oncology

## 2013-12-08 ENCOUNTER — Other Ambulatory Visit (HOSPITAL_BASED_OUTPATIENT_CLINIC_OR_DEPARTMENT_OTHER): Payer: BC Managed Care – PPO

## 2013-12-08 ENCOUNTER — Encounter: Payer: Self-pay | Admitting: Radiation Oncology

## 2013-12-08 VITALS — BP 92/67 | HR 67 | Temp 98.0°F | Resp 20 | Wt 125.5 lb

## 2013-12-08 DIAGNOSIS — Z9013 Acquired absence of bilateral breasts and nipples: Secondary | ICD-10-CM

## 2013-12-08 DIAGNOSIS — C50911 Malignant neoplasm of unspecified site of right female breast: Secondary | ICD-10-CM

## 2013-12-08 DIAGNOSIS — C50311 Malignant neoplasm of lower-inner quadrant of right female breast: Secondary | ICD-10-CM

## 2013-12-08 DIAGNOSIS — C50919 Malignant neoplasm of unspecified site of unspecified female breast: Secondary | ICD-10-CM

## 2013-12-08 DIAGNOSIS — C50319 Malignant neoplasm of lower-inner quadrant of unspecified female breast: Secondary | ICD-10-CM

## 2013-12-08 DIAGNOSIS — Z510 Encounter for antineoplastic radiation therapy: Secondary | ICD-10-CM | POA: Diagnosis not present

## 2013-12-08 LAB — COMPREHENSIVE METABOLIC PANEL (CC13)
ALT: 34 U/L (ref 0–55)
AST: 42 U/L — ABNORMAL HIGH (ref 5–34)
Albumin: 4.1 g/dL (ref 3.5–5.0)
Alkaline Phosphatase: 84 U/L (ref 40–150)
Anion Gap: 8 mEq/L (ref 3–11)
BUN: 10.2 mg/dL (ref 7.0–26.0)
CO2: 29 mEq/L (ref 22–29)
Calcium: 9.6 mg/dL (ref 8.4–10.4)
Chloride: 105 mEq/L (ref 98–109)
Creatinine: 0.8 mg/dL (ref 0.6–1.1)
Glucose: 72 mg/dl (ref 70–140)
Potassium: 3.9 mEq/L (ref 3.5–5.1)
Sodium: 142 mEq/L (ref 136–145)
Total Bilirubin: 0.35 mg/dL (ref 0.20–1.20)
Total Protein: 6.9 g/dL (ref 6.4–8.3)

## 2013-12-08 LAB — CBC WITH DIFFERENTIAL/PLATELET
BASO%: 0.2 % (ref 0.0–2.0)
Basophils Absolute: 0 10*3/uL (ref 0.0–0.1)
EOS%: 2.1 % (ref 0.0–7.0)
Eosinophils Absolute: 0.1 10*3/uL (ref 0.0–0.5)
HCT: 37.4 % (ref 34.8–46.6)
HGB: 12.5 g/dL (ref 11.6–15.9)
LYMPH%: 33.9 % (ref 14.0–49.7)
MCH: 30 pg (ref 25.1–34.0)
MCHC: 33.4 g/dL (ref 31.5–36.0)
MCV: 89.7 fL (ref 79.5–101.0)
MONO#: 0.4 10*3/uL (ref 0.1–0.9)
MONO%: 7.4 % (ref 0.0–14.0)
NEUT#: 2.9 10*3/uL (ref 1.5–6.5)
NEUT%: 56.4 % (ref 38.4–76.8)
Platelets: 170 10*3/uL (ref 145–400)
RBC: 4.17 10*6/uL (ref 3.70–5.45)
RDW: 13.7 % (ref 11.2–14.5)
WBC: 5.1 10*3/uL (ref 3.9–10.3)
lymph#: 1.7 10*3/uL (ref 0.9–3.3)

## 2013-12-08 LAB — FOLLICLE STIMULATING HORMONE: FSH: 122.7 m[IU]/mL — ABNORMAL HIGH

## 2013-12-08 NOTE — Progress Notes (Signed)
Patient denies pain, loss of appetite. She continues to be fatigued. She states the Biafine lotion has not been helpful with itching of her upper right breast dermatitis. Advised she may apply Cortisone cream 1% prn. No desquamation of right breast skin. She states her right axilla redness from shaving has resolved; she is not using any deodorant in her right axilla at this time. She has no other c/o today.

## 2013-12-08 NOTE — Progress Notes (Signed)
Weekly Management Note:  Site: Right breast Current Dose:  1980  cGy Projected Dose: 4500  cGy followed by 8 fraction boost  Narrative: The patient is seen today for routine under treatment assessment. CBCT/MVCT images/port films were reviewed. The chart was reviewed.   She is to do well and uses Biafine cream however, she is developing a mild radiation dermatitis along the upper inner quadrant of her right breast. This is pruritic.  Physical Examination:  Filed Vitals:   12/08/13 1455  BP: 92/67  Pulse: 67  Temp: 98 F (36.7 C)  Resp: 20  .  Weight: 125 lb 8 oz (56.926 kg). There is focal papular radiation dermatitis along the upper inner quadrant of the right breast along sun exposed skin.   Impression: Tolerating radiation therapy well. She may use hydrocortisone cream along the area of her pruritic radiation dermatitis. She can use Biafine cream elsewhere.  Plan: Continue radiation therapy as planned.

## 2013-12-09 ENCOUNTER — Ambulatory Visit
Admission: RE | Admit: 2013-12-09 | Discharge: 2013-12-09 | Disposition: A | Discharge status: Home / Self Care | Payer: BC Managed Care – PPO | Source: Ambulatory Visit | Attending: Radiation Oncology | Admitting: Radiation Oncology

## 2013-12-09 DIAGNOSIS — Z510 Encounter for antineoplastic radiation therapy: Secondary | ICD-10-CM | POA: Diagnosis not present

## 2013-12-10 ENCOUNTER — Ambulatory Visit
Admission: RE | Admit: 2013-12-10 | Discharge: 2013-12-10 | Disposition: A | Discharge status: Home / Self Care | Payer: BC Managed Care – PPO | Source: Ambulatory Visit | Attending: Radiation Oncology | Admitting: Radiation Oncology

## 2013-12-10 DIAGNOSIS — Z510 Encounter for antineoplastic radiation therapy: Secondary | ICD-10-CM | POA: Diagnosis not present

## 2013-12-11 ENCOUNTER — Ambulatory Visit
Admission: RE | Admit: 2013-12-11 | Discharge: 2013-12-11 | Disposition: A | Discharge status: Home / Self Care | Payer: BC Managed Care – PPO | Source: Ambulatory Visit | Attending: Radiation Oncology | Admitting: Radiation Oncology

## 2013-12-11 DIAGNOSIS — Z510 Encounter for antineoplastic radiation therapy: Secondary | ICD-10-CM | POA: Diagnosis not present

## 2013-12-12 ENCOUNTER — Ambulatory Visit
Admission: RE | Admit: 2013-12-12 | Discharge: 2013-12-12 | Disposition: A | Discharge status: Home / Self Care | Payer: BC Managed Care – PPO | Source: Ambulatory Visit | Attending: Radiation Oncology | Admitting: Radiation Oncology

## 2013-12-12 DIAGNOSIS — Z510 Encounter for antineoplastic radiation therapy: Secondary | ICD-10-CM | POA: Diagnosis not present

## 2013-12-13 LAB — ESTRADIOL, ULTRA SENS: Estradiol, Ultra Sensitive: 19 pg/mL

## 2013-12-16 ENCOUNTER — Ambulatory Visit
Admission: RE | Admit: 2013-12-16 | Discharge: 2013-12-16 | Disposition: A | Discharge status: Home / Self Care | Payer: BC Managed Care – PPO | Source: Ambulatory Visit | Attending: Radiation Oncology | Admitting: Radiation Oncology

## 2013-12-16 VITALS — BP 94/70 | HR 69 | Temp 98.4°F | Resp 20 | Wt 124.9 lb

## 2013-12-16 DIAGNOSIS — Z510 Encounter for antineoplastic radiation therapy: Secondary | ICD-10-CM | POA: Diagnosis not present

## 2013-12-16 DIAGNOSIS — L589 Radiodermatitis, unspecified: Secondary | ICD-10-CM

## 2013-12-16 DIAGNOSIS — C50311 Malignant neoplasm of lower-inner quadrant of right female breast: Secondary | ICD-10-CM

## 2013-12-16 MED ORDER — HYDROXYZINE HCL 10 MG PO TABS: 10.0000 mg | ORAL_TABLET | Freq: Three times a day (TID) | ORAL | Status: DC | PRN

## 2013-12-16 NOTE — Progress Notes (Signed)
Patient denies pain, loss of appetite. She is fatigued. She c/o itching in her upper right chest where she has radiation dermatitis. She has been applying Biafine, 1% Cortisone cream with little relief. She takes Benadryl prn but states "it takes a long time to get into my system". Advised she may apply pure Aloe vera gel, a cool cloth, but advised her not to apply anything that has been in the freezer. Also suggested she try allergy medication daily. Pt also has irritation under her right breast but denies itching in this area.

## 2013-12-16 NOTE — Progress Notes (Signed)
Weekly Management Note:  Site: Right breast Current Dose:  2880  cGy Projected Dose: 4500  cGy followed by a fraction boost  Narrative: The patient is seen today for routine under treatment assessment. CBCT/MVCT images/port films were reviewed. The chart was reviewed.   She is having more pruritus particularly along the upper inner quadrant of her right breast. She tried 1% hydrocortisone cream without good relief. She has been using Biafine cream. She has tried Benadryl but takes some time before "it gets into my system".  Physical Examination:  Filed Vitals:   12/16/13 1458  BP: 94/70  Pulse: 69  Temp: 98.4 F (36.9 C)  Resp: 20  .  Weight: 124 lb 14.4 oz (56.654 kg). There is a papular dermatitis, particularly the upper inner quadrant of the right breast with some erythema along the inframammary region. There is a small area of excoriation along the upper inner quadrant of the right breast.  Impression: Tolerating radiation therapy well, except for pruritus. I will try hydroxyzine 10 mg by mouth 3 times a day when necessary. This may help with her anxiety as well. I cautioned her about driving. She is not to take Benadryl if she is taking hydroxyzine.  Plan: Continue radiation therapy as planned.

## 2013-12-17 ENCOUNTER — Ambulatory Visit
Admission: RE | Admit: 2013-12-17 | Discharge: 2013-12-17 | Disposition: A | Discharge status: Home / Self Care | Payer: BC Managed Care – PPO | Source: Ambulatory Visit | Attending: Radiation Oncology | Admitting: Radiation Oncology

## 2013-12-17 DIAGNOSIS — Z510 Encounter for antineoplastic radiation therapy: Secondary | ICD-10-CM | POA: Diagnosis not present

## 2013-12-17 NOTE — Progress Notes (Signed)
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 She requested this RN put a note in her record that she has felt very fatigued and tired the past two days. Pt continues to work full time but states she is going to take a leave from work. She also has a child at home. Suggested that these combined with her receiving radiation treatment contribute to her fatigue. Pt states that taking Hydroxyzine last night helped with her itching. She has decided to stop applying Biafine and use Radiaplex with Cortisone cream for itching, feeling that the Biafine may be irritating to her skin.

## 2013-12-18 ENCOUNTER — Ambulatory Visit
Admission: RE | Admit: 2013-12-18 | Discharge: 2013-12-18 | Disposition: A | Discharge status: Home / Self Care | Payer: BC Managed Care – PPO | Source: Ambulatory Visit | Attending: Radiation Oncology | Admitting: Radiation Oncology

## 2013-12-18 DIAGNOSIS — Z510 Encounter for antineoplastic radiation therapy: Secondary | ICD-10-CM | POA: Diagnosis not present

## 2013-12-19 ENCOUNTER — Ambulatory Visit
Admission: RE | Admit: 2013-12-19 | Discharge: 2013-12-19 | Disposition: A | Discharge status: Home / Self Care | Payer: BC Managed Care – PPO | Source: Ambulatory Visit | Attending: Radiation Oncology | Admitting: Radiation Oncology

## 2013-12-19 DIAGNOSIS — Z510 Encounter for antineoplastic radiation therapy: Secondary | ICD-10-CM | POA: Diagnosis not present

## 2013-12-22 ENCOUNTER — Ambulatory Visit
Admission: RE | Admit: 2013-12-22 | Discharge: 2013-12-22 | Disposition: A | Discharge status: Home / Self Care | Payer: BC Managed Care – PPO | Source: Ambulatory Visit | Attending: Radiation Oncology | Admitting: Radiation Oncology

## 2013-12-22 ENCOUNTER — Encounter: Payer: Self-pay | Admitting: Radiation Oncology

## 2013-12-22 ENCOUNTER — Telehealth: Payer: Self-pay | Admitting: Oncology

## 2013-12-22 ENCOUNTER — Ambulatory Visit: Payer: BC Managed Care – PPO | Admitting: Radiation Oncology

## 2013-12-22 ENCOUNTER — Ambulatory Visit (HOSPITAL_BASED_OUTPATIENT_CLINIC_OR_DEPARTMENT_OTHER): Payer: BC Managed Care – PPO | Admitting: Oncology

## 2013-12-22 VITALS — BP 103/73 | HR 69 | Temp 98.2°F | Resp 20 | Wt 125.4 lb

## 2013-12-22 VITALS — BP 106/76 | HR 73 | Temp 97.8°F | Resp 20 | Ht 63.0 in | Wt 123.9 lb

## 2013-12-22 DIAGNOSIS — Z510 Encounter for antineoplastic radiation therapy: Secondary | ICD-10-CM | POA: Diagnosis not present

## 2013-12-22 DIAGNOSIS — Z9013 Acquired absence of bilateral breasts and nipples: Secondary | ICD-10-CM

## 2013-12-22 DIAGNOSIS — C50319 Malignant neoplasm of lower-inner quadrant of unspecified female breast: Secondary | ICD-10-CM

## 2013-12-22 DIAGNOSIS — C50311 Malignant neoplasm of lower-inner quadrant of right female breast: Secondary | ICD-10-CM

## 2013-12-22 DIAGNOSIS — C50911 Malignant neoplasm of unspecified site of right female breast: Secondary | ICD-10-CM

## 2013-12-22 DIAGNOSIS — C50919 Malignant neoplasm of unspecified site of unspecified female breast: Secondary | ICD-10-CM

## 2013-12-22 DIAGNOSIS — Z170 Estrogen receptor positive status [ER+]: Secondary | ICD-10-CM

## 2013-12-22 HISTORY — DX: Malignant neoplasm of unspecified site of right female breast: C50.911

## 2013-12-22 MED ORDER — TRAMADOL HCL 50 MG PO TABS: ORAL_TABLET | ORAL | Status: DC

## 2013-12-22 MED ORDER — RADIAPLEXRX EX GEL
Freq: Once | CUTANEOUS | Status: AC
  Administered 2013-12-22: 16:00:00 via TOPICAL

## 2013-12-22 NOTE — Progress Notes (Signed)
ID: [name]   DOB: 12-21-1970  MR#: [id number]  DPO#:[id number]   PCP: Chauncey Cruel, MD GYN: Baltazar Najjar MD SU: Fanny Skates MD OTHER MD: Theodoro Kos, [name], [name]  CHIEF COMPLAINT:  Recurrent Breast Cancer, Right CURRENT THERAPY: To start radiation therapy   BREAST CANCER HISTORY:  From the original intake note:  "[name] (goes by "[name]") is a [place] woman who, at the age of 32, was referred by Dr. Baltazar Najjar for treatment of right breast carcinoma.   1.  The patient palpated a mass in her right breast which she brought to Dr. Jacelyn Grip attention. A prior screening mammogram on 02/06/2010 had been unremarkable. The patient was then scheduled for bilateral diagnostic mammogram and right breast ultrasonography on 12/22/2010, the studies demonstrating an ill defined mass with heterogeneous calcifications in the inner lower right breast, measuring 1.7 cm. The breasts are heterogeneously dense, but there were no other mammographic abnormalities in either breast. Clinically, there was a palpable firm mass at the 5:00 position of the right breast, 5 cm from the nipple. There were no palpable abnormalities in the right axilla. Ultrasound confirmed a 1.7 cm irregular, hypoechoic mass in the same position. There were some lymph nodes noted in the right exam of, one measuring 1.6 cm with a minimally thickened cortex inferiorly.  An ultrasound-guided core biopsy on 12/23/2010 [phone]) showed an invasive ductal carcinoma, grade 2, ER positive at 88%, PR positive at 11 is %, HER-2/neu positive with a FISH ratio of 4.61, and an MIB-1 of 36%.  The patient was referred to Dr. Fanny Skates and bilateral breast MRIs were obtained 12/30/2010. This showed the mass in question in the right breast to measure 2.0 cm. In the right axilla there was a 1.8 cm lymph node with a thickened cortex. The left breast was unremarkable.  Her subsequent treatment is  as detailed below.  2.   [name] then had a restaging PET scan late December 2014 which was positive only for a small subcutaneous nodule in the right breast lower outer quadrant. There was no other finding of concern. Ultrasound of both breasts 04/08/2013 showed a nodule in the right breast at the 4:00 position measuring 0.7 cm. The right axilla was clear. There was an area at the 7:00 position of the left breast which was of concern by palpation but showed only normal tissue.   Biopsy of the right breast mass 04/09/2013 showed an invasive ductal carcinoma, grade 2, estrogen receptor 94% positive, progesterone receptor 11% positive, with an MIB-1 of 19% and HER-2 amplified with a signals ratio of 3.75 and the number per cell of 4.50. The patient discussed the situation with with her surgeon's and on 05/05/2013 proceeded to right lumpectomy, with the final pathology (S. is a [id number]) confirming an invasive ductal carcinoma, 0.7 cm, grade 2, within a millimeter of the posterior margin, which showed skeletal muscle. A port was placed at the time of that procedure."  Her subsequent treatment is as detailed below.  INTERVAL HISTORY:  [name] returns today for followup of her recurrent right breast cancer. She is currently undergoing radiation, which is scheduled to continue another 2 weeks. She is having significant fatigue from this, to the point that it is hard for her to go for a short walk on a daily basis. She's also having significant skin changes are ready. She is unable to wear a bra.   REVIEW OF SYSTEMS:  [name] has some hot flashes and night  sweats, although these are slightly better. She is severely fatigued as does noted. This morning she developed a severe pain in the left lower quadrant. This made it very difficult to get positioned correctly for her radiation treatments. She had a normal bowel movement today. She has had no changes in her bladder habits. She wonders if "one of the ovaries is  doing something". She has a little bit of a running nose. She is going to have some abnormal moles looked at by Dr. Ubaldo Glassing early October. During this headaches are better, less frequent and milder. A detailed review of systems today was otherwise stable  PAST MEDICAL HISTORY: Past Medical History  Diagnosis Date  . Migraines   . Asthma     as a child  . PONV (postoperative nausea and vomiting)   . Rash     neck  . Anxiety   . Pneumonia   . Kidney stones   . GERD (gastroesophageal reflux disease)     had during chemo  . Cervical dysplasia   . Breast cancer Sept 2012    right breast, inner lower right  1. History of prior adenoidectomy. 2. History of rhinoplasty.   3. History of multiple uterine cervical treatments for precancerous lesions, none for the past 10 years or so. 4. History of fibroid tumor surgery. 5. History of D and C. 6. History of dysplastic nevi removed by Dr. Ronnald Ramp. 7. History of right "lazy eye" corrective surgery. 8. History of approximately 10-pack-years of tobacco abuse, resolved.  9. History of possible migraines (morning headaches). History of asthma as a child.  PAST SURGICAL HISTORY: Past Surgical History  Procedure Laterality Date  . Rhinoplasty    . Adenoidectomy    . Fibroid tumor surgery    . Dilation and curettage of uterus    . Uterine cervical treatments  2002    for precancerous lesions  . Lazy eye      corrective surgery  . Mastectomy w/ sentinel node biopsy  06/29/2011    Procedure: MASTECTOMY WITH SENTINEL LYMPH NODE BIOPSY;  Surgeon: Adin Hector, MD;  Location: [facility];  Service: General;  Laterality: Right;  right skin spairing mstectomy and right sentinel lymph node biopsy  . Breast reconstruction  06/29/2011    Procedure: BREAST RECONSTRUCTION;  Surgeon: Theodoro Kos, DO;  Location: [facility];  Service: Plastics;  Laterality: Bilateral;  Immediate Bilateral Breast Reconstruction with Bilateral Tissue Expanders and placement of  Alloderm    . Port-a-cath removal  03/12/2012    Procedure: MINOR REMOVAL PORT-A-CATH;  Surgeon: Adin Hector, MD;  Location: [facility];  Service: General;  Laterality: N/A;  Upper left  . Breast lumpectomy with needle localization Right 05/05/2013    Procedure: EXCISION RECURRENT CANCER RIGHT BREAST WITH NEEDLE LOCALOZATION;  Surgeon: Adin Hector, MD;  Location: [facility];  Service: General;  Laterality: Right;  . Portacath placement Left 05/05/2013    Procedure: INSERTION PORT-A-CATH;  Surgeon: Adin Hector, MD;  Location: [facility];  Service: General;  Laterality: Left;    FAMILY HISTORY Family History  Problem Relation Age of Onset  . Adopted: Yes  . Heart attack Father   . Cancer Cousin 40    melanoma; mat first cousin, located on neck  . Cancer Maternal Aunt 60    stomach and ovarian as separate primaries  . Cancer Maternal Uncle 65    lung; smoker  . Cancer Maternal Grandmother 75    enodometrial  . Cancer  Maternal Aunt 55    breast  . Cancer Maternal Uncle 63    prostate  :  The patient's biological father died at the age of 15 from myocardial infarction.  The patient's biological mother is alive at age 30.  The patient was adopted by her biological mother's parents.  She has 3 half-brothers.  No sisters.  One cousin on her mother's side died from melanoma at the age of 60.  There is other cancer history in the adopted side but these are not even half-brothers or sisters, they are children of her adopted parents who are really her grandparents so these would be uncles and aunts (1 lymph node cancer, 1 prostate cancer, 1 breast cancer at the age of 105).  GYNECOLOGIC HISTORY:   (Updated 05/22/2013) She had menarche age 89 or 49.  She is GX, P1, first pregnancy to term at age 45. She stopped having periods approximately March 2013.  Hormone levels in February 2015 show patient to be premenopausal, with an estradiol of 113 on 05/15/2013, and her periods indeed resumed February  of 2015  SOCIAL HISTORY: (Updated 06/12/2013) She works in a Engineer, agricultural.  Her husband, Khamari Yousuf owns a family business called [employer].  They have been married 5 years.  They have a son, Kellie Simmering, age 26. Romilda Joy also has a daughter, Valbona Slabach, who lives in [place] and is 44 years old.  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  The patient is not a church attender.    ADVANCED DIRECTIVES: Not in place  HEALTH MAINTENANCE:  (Updated 06/12/2013) History  Substance Use Topics  . Smoking status: Former Smoker    Quit date: 07/20/1996  . Smokeless tobacco: Never Used  . Alcohol Use: No     Colonoscopy:  Never  PAP: Oct 2014/[facility]  Bone density: Oct 2014, mild osteopenia with a T score of -1.1  Lipid panel: Not on file  Allergies  Allergen Reactions  . Tussionex Pennkinetic Er [Hydrocod Polst-Cpm Polst Er] Other (See Comments)    hallucinations  . Codeine Other (See Comments)    unkown  . Morphine Rash  . Morphine And Related Anxiety    anxiety  . Penicillin G Rash  . Penicillins Rash    Current Outpatient Prescriptions  Medication Sig Dispense Refill  . aspirin-acetaminophen-caffeine (EXCEDRIN MIGRAINE) 250-250-65 MG per tablet Take 2 tablets by mouth every 6 (six) hours as needed for headache.      . Biotin 5000 MCG CAPS Take 5,000 mcg by mouth daily.      . chlorpheniramine (ALLERGY) 4 MG tablet Take by mouth.      . doxycycline (VIBRA-TABS) 100 MG tablet Take 1 tablet (100 mg total) by mouth 2 (two) times daily.  30 tablet  6  . emollient (BIAFINE) cream Apply topically 2 (two) times daily.      . fluconazole (DIFLUCAN) 150 MG tablet       . hydrOXYzine (ATARAX/VISTARIL) 10 MG tablet Take 1 tablet (10 mg total) by mouth 3 (three) times daily as needed.  30 tablet  3  . ibuprofen (ADVIL,MOTRIN) 800 MG tablet Take 1 tablet (800 mg total) by mouth every 8 (eight) hours as needed.  60 tablet  1  . LORazepam (ATIVAN) 0.5 MG tablet Take 1 tablet (0.5 mg total)  by mouth 2 (two) times daily as needed for anxiety.  60 tablet  0  . Multiple Vitamin (MULTIVITAMIN) tablet Take 1 tablet by mouth daily.      [name]  Multiple Vitamins-Minerals (HAIR/SKIN/NAILS PO) Take 1 tablet by mouth daily.       . prenatal vitamin w/FE, FA (PRENATAL 1 + 1) 27-1 MG TABS Take 1 tablet by mouth Daily.      . traMADol (ULTRAM) 50 MG tablet TAKE ONE TABLET BY MOUTH EVERY 6 HOURS AS NEEDED FOR MODERATE PAIN  30 tablet  1  . Wound Dressings (RADIAGEL) GEL Apply topically 2 (two) times daily.       No current facility-administered medications for this visit.    OBJECTIVE: Middle-aged white woman who appears stated age 43 Vitals:   12/22/13 1617  BP: 106/76  Pulse: 73  Temp: 97.8 F (36.6 C)  Resp: 20     Body mass index is 21.95 kg/(m^2).    ECOG FS: 2 Filed Weights   12/22/13 1617  Weight: 123 lb 14.4 oz (56.201 kg)   Sclerae unicteric, EOMs intact Oropharynx clear , no thrush or other lesions seen air  No cervical or supraclavicular adenopathy Lungs no rales or rhonchi Heart regular rate and rhythm Abd soft, positive bowel sounds; mild tenderness to left lower quadrant palpation, but no masses, guarding, or rebound noted MSK no focal spinal tenderness, no upper extremity lymphedema Neuro: nonfocal, well oriented, upper.  affect Breasts: Status post bilateral mastectomies with implant reconstruction. There is no evidence of local recurrence. Both axillae are benign.  LAB RESULTS: Lab Results  Component Value Date   WBC 5.1 12/08/2013   NEUTROABS 2.9 12/08/2013   HGB 12.5 12/08/2013   HCT 37.4 12/08/2013   MCV 89.7 12/08/2013   PLT 170 12/08/2013      Chemistry      Component Value Date/Time   NA 142 12/08/2013 1528   NA 142 05/05/2013 1059   K 3.9 12/08/2013 1528   K 5.0 05/05/2013 1059   CL 103 05/05/2013 1059   CL 104 09/23/2012 1415   CO2 29 12/08/2013 1528   CO2 22 05/05/2013 1059   BUN 10.2 12/08/2013 1528   BUN 11 05/05/2013 1059   CREATININE 0.8 12/08/2013  1528   CREATININE 0.70 05/05/2013 1059   CREATININE 0.77 01/08/2013 1456      Component Value Date/Time   CALCIUM 9.6 12/08/2013 1528   CALCIUM 9.1 05/05/2013 1059   ALKPHOS 84 12/08/2013 1528   ALKPHOS 47 01/08/2013 1456   AST 42* 12/08/2013 1528   AST 18 01/08/2013 1456   ALT 34 12/08/2013 1528   ALT 13 01/08/2013 1456   BILITOT 0.35 12/08/2013 1528   BILITOT 0.5 01/08/2013 1456       STUDIES: No new results found.  ASSESSMENT: 43 y.o.  BRCA 1-2 negative [name], [place] woman,   (1) status post right breast biopsy in September 2012 for a clinically 2.0 cm invasive ductal carcinoma, with some enlarged Right axillary lymph nodes, the largest one of which was negative by biopsy. The tumor was grade 2, estrogen receptor positive at 88%, progesterone receptor positive at 11%, HER-2/neu positive by CISH with a ratio of 4.61, and a proliferation marker of 36%.   (2) treated in the neoadjuvant setting with Q 3 week docetaxel/ carboplatin/ trastuzumab x6, completed 05/18/2011.  Continued on trastuzumab every 3 weeks to complete one year, last dose in November 2013.  (3) s/p bilateral mastectomies with sentinel node biopsy 06/29/2011 for a residual right-sided ypT1c ypN0 invasive ductal carcinoma, grade 2, with immediate expander placement  (4) on tamoxifen as of May 2013, discontinued in early 2015 due to breast cancer recurrence   (  5) status post left lumpectomy with left-sided sentinel lymph node sampling 05/05/2013 for a pT1b pN0, stage IA invasive ductal carcinoma, estrogen receptor 94% positive, progesterone receptor 11% positive, with an MIB-1 of 19% and HER-2 amplification  (6)  Received TDM-1 every 3 weeks x 8 doses completed 10/09/2013; echo 08/25/2013 showed a well preserved ejection fraction  (7) adjuvant radiation therapy to be completed 01/08/2014  (8) to start anastrozole with or without goserelin at the completion of radiation therapy  (9) genetic testing found a variant of  uncertain significance in the ATM gene, namely ATM, [id number].   PLAN:  [name] is struggling somewhat with radiation but is very determined to get done with it. I don't think she is going to be recovered enough to get back to work just a few days later. I think about the earliest she is likely to be able to get back to work would be October 19. I went ahead and wrote her a note saying that. I hope we will be able to accommodate their schedule to her temporary limitations  I also don't think she is going to be able to start the anastrozole until she is recovered from the radiation. Otherwise she will simply confuse the fatigue pain and other side effects as being due to the anastrozole itself.  Instead I am going to see her again November 4. If she has sufficiently recovered by then we will start anastrozole.  She understands that even though her [place] is high and her estradiol is low, which means right now she is in menopause, her ovaries may wake up at any point in the next year, and therefore she really does need to continue to be careful in terms of not getting pregnant. On the other hand if she went on goserelin or had her ovaries removed, that would no longer be an issue.  At this point what she wants to do is check the [facility] and estradiol every 3 months once she starts the anastrozole. I think that is reasonable. Also I am scheduling her for a PET scan of the very end of December. I think she will be sufficiently healed from the radiation that inflammation in the chest wall we'll not confuse [place].  She also wondered if she should have decided to go ahead and received trastuzumab alone for some period of time after completing the T.-D M1. I have no data regarding that. I am comfortable not adding a drug that may or may not be indicated and which has after all some potential for cardiac toxicity.  She has a good understanding of the overall plan. She agrees with it. She knows to goal of treatment  in her case is cure. She will call with any problems that may develop before her next visit.   Chauncey Cruel, MD     12/22/2013

## 2013-12-22 NOTE — Progress Notes (Signed)
Weekly Management Note:  Site: R Breast Current Dose:  3600  cGy Projected Dose: 4500  cGy followed by 8 fraction boost  Narrative: The patient is seen today for routine under treatment assessment. CBCT/MVCT images/port films were reviewed. The chart was reviewed.   She reports some swelling of her right index finger. She has been using Neosporin ointment to focal areas of desquamation along the upper-outer quadrant of her right breast. She also has hydrocortisone cream and Radioplex gel to use when necessary.  Physical Examination:  Filed Vitals:   12/22/13 1524  BP: 103/73  Pulse: 69  Temp: 98.2 F (36.8 C)  Resp: 20  .  Weight: 125 lb 6.4 oz (56.881 kg). On examination of the right breast there is a papular radiation dermatitis with essential dry desquamation, but perhaps punctate moist desquamation or excoriated skin. There is a 0.5 cm right axillary node which is unchanged. There is slight edema of the right second finger.  Impression: Tolerating radiation therapy well. I would like to hold off on the use of Neosporin since she may develop a hypersensitivity to Neosporin. She does not having significant moist desquamation for antibiotic ointment. We'll monitor her right index finger edema in the next week.  Plan: Continue radiation therapy as planned.

## 2013-12-22 NOTE — Telephone Encounter (Signed)
per pof to sch pt appt-cld & left pt a message in re to time & date

## 2013-12-22 NOTE — Progress Notes (Signed)
Patient continues to have soreness, irritation, itching of skin of right breast. She has bright hyperpigmentation with dry desquamation of upper inner right breast. She is applying Neosporin to desquamated areas, Cortisone and Radiaplex to remainder of treatment areas. She states Atarax is fairly helpful. She reports continued fatigue, occasional weakness.

## 2013-12-23 ENCOUNTER — Ambulatory Visit
Admission: RE | Admit: 2013-12-23 | Discharge: 2013-12-23 | Disposition: A | Discharge status: Home / Self Care | Payer: BC Managed Care – PPO | Source: Ambulatory Visit | Attending: Radiation Oncology | Admitting: Radiation Oncology

## 2013-12-23 DIAGNOSIS — Z510 Encounter for antineoplastic radiation therapy: Secondary | ICD-10-CM | POA: Diagnosis not present

## 2013-12-24 ENCOUNTER — Ambulatory Visit
Admission: RE | Admit: 2013-12-24 | Discharge: 2013-12-24 | Disposition: A | Discharge status: Home / Self Care | Payer: BC Managed Care – PPO | Source: Ambulatory Visit | Attending: Radiation Oncology | Admitting: Radiation Oncology

## 2013-12-24 ENCOUNTER — Encounter: Payer: Self-pay | Admitting: Radiation Oncology

## 2013-12-24 DIAGNOSIS — Z510 Encounter for antineoplastic radiation therapy: Secondary | ICD-10-CM | POA: Diagnosis not present

## 2013-12-24 NOTE — Progress Notes (Signed)
Electron beam simulation note: The patient completed month to call electron beam simulation for her right breast boost. She was set up en face. One custom block was designed/constructed to conform the field. I am prescribing 1440 cGy in 8 sessions utilizing 6 MEV electrons. A special port plan was reviewed and approved.

## 2013-12-25 ENCOUNTER — Ambulatory Visit
Admission: RE | Admit: 2013-12-25 | Discharge: 2013-12-25 | Disposition: A | Discharge status: Home / Self Care | Payer: BC Managed Care – PPO | Source: Ambulatory Visit | Attending: Radiation Oncology | Admitting: Radiation Oncology

## 2013-12-25 DIAGNOSIS — Z510 Encounter for antineoplastic radiation therapy: Secondary | ICD-10-CM | POA: Diagnosis not present

## 2013-12-26 ENCOUNTER — Ambulatory Visit
Admission: RE | Admit: 2013-12-26 | Discharge: 2013-12-26 | Disposition: A | Discharge status: Home / Self Care | Payer: BC Managed Care – PPO | Source: Ambulatory Visit | Attending: Radiation Oncology | Admitting: Radiation Oncology

## 2013-12-26 DIAGNOSIS — Z510 Encounter for antineoplastic radiation therapy: Secondary | ICD-10-CM | POA: Diagnosis not present

## 2013-12-29 ENCOUNTER — Encounter: Payer: Self-pay | Admitting: Radiation Oncology

## 2013-12-29 ENCOUNTER — Ambulatory Visit
Admission: RE | Admit: 2013-12-29 | Discharge: 2013-12-29 | Disposition: A | Discharge status: Home / Self Care | Payer: BC Managed Care – PPO | Source: Ambulatory Visit | Attending: Radiation Oncology | Admitting: Radiation Oncology

## 2013-12-29 VITALS — BP 106/73 | HR 64 | Temp 98.2°F | Resp 20 | Wt 125.5 lb

## 2013-12-29 DIAGNOSIS — Z510 Encounter for antineoplastic radiation therapy: Secondary | ICD-10-CM | POA: Diagnosis not present

## 2013-12-29 DIAGNOSIS — C50311 Malignant neoplasm of lower-inner quadrant of right female breast: Secondary | ICD-10-CM

## 2013-12-29 NOTE — Progress Notes (Signed)
Weekly Management Note:  Site: Right breast/chest Current Dose:  4500  cGy Projected Dose: 1840  RFV followed by electron beam left breast/chest wall boost to her tumor bed  Narrative: The patient is seen today for routine under treatment assessment. CBCT/MVCT images/port films were reviewed. The chart was reviewed.   She is having more axillary discomfort as expected. She uses tramadol and ibuprofen. She has been using Radioplex gel when necessary for radiation dermatitis which is primarily along the upper inner quadrant of the reconstructed breast. Hydroxyzine has been helpful for pruritus. She is also having moderate fatigue. She'll take a break from work until October 19.  Physical Examination:  Filed Vitals:   12/29/13 1512  BP: 106/73  Pulse: 64  Temp: 98.2 F (36.8 C)  Resp: 20  .  Weight: 125 lb 8 oz (56.926 kg). There is a papular radiation dermatitis along the upper-outer quadrant of the left reconstructed breast/chest wall and moderate to severe radiation dermatitis along her axilla as expected. There is patchy dry desquamation along the axilla and chest wall.  Impression: Tolerating radiation therapy well with the expected degree of radiation dermatitis and fatigue  Plan: Continue radiation therapy as planned.

## 2013-12-29 NOTE — Progress Notes (Signed)
Patient reports moderate pain in her right axilla. She states she tries to keep her arm over her head while sleeping, but she moves and feels the discomfort in her axilla. hse takes Ibuprofen, Excedrin or Tramadol prn. She states the Ibuprofen does not give much relief. She has desquamation of axilla and radiation dermatitis of her upper right chest which has dry desquamation. She is applying Radiaplex but states she is unsure if it is safe due to Dimethicone as one of the ingredients. Pt is fatigued, no loss of appetite.

## 2013-12-30 ENCOUNTER — Ambulatory Visit
Admission: RE | Admit: 2013-12-30 | Discharge: 2013-12-30 | Disposition: A | Discharge status: Home / Self Care | Payer: BC Managed Care – PPO | Source: Ambulatory Visit | Attending: Radiation Oncology | Admitting: Radiation Oncology

## 2013-12-30 DIAGNOSIS — Z510 Encounter for antineoplastic radiation therapy: Secondary | ICD-10-CM | POA: Diagnosis not present

## 2013-12-31 ENCOUNTER — Ambulatory Visit
Admission: RE | Admit: 2013-12-31 | Discharge: 2013-12-31 | Disposition: A | Discharge status: Home / Self Care | Payer: BC Managed Care – PPO | Source: Ambulatory Visit | Attending: Radiation Oncology | Admitting: Radiation Oncology

## 2013-12-31 DIAGNOSIS — Z510 Encounter for antineoplastic radiation therapy: Secondary | ICD-10-CM | POA: Diagnosis not present

## 2014-01-01 ENCOUNTER — Ambulatory Visit
Admission: RE | Admit: 2014-01-01 | Discharge: 2014-01-01 | Disposition: A | Discharge status: Home / Self Care | Payer: BC Managed Care – PPO | Source: Ambulatory Visit | Attending: Radiation Oncology | Admitting: Radiation Oncology

## 2014-01-01 DIAGNOSIS — Z510 Encounter for antineoplastic radiation therapy: Secondary | ICD-10-CM | POA: Diagnosis not present

## 2014-01-01 NOTE — Progress Notes (Signed)
Saw pt in hall prior to her treatment and requested she see this RN before leaving today. Patient in nursing following radiation treatment as requested. She states her right axilla feels "tight and dry". Right axilla with dry desquamation and hyperpigmentation. Advised she buy Aloe Vera gel and apply now and through the weekend. Advised she buy Aloe with no added ingredients.  She will see Dr Valere Dross on Monday. Patient verbalized understanding and agreement.

## 2014-01-01 NOTE — Progress Notes (Signed)
Patient came into nursing on 12/31/13 following her radiation treatment. She states when she applied Radiaplex to her right axilla today "it started stinging". She has been applying a deodorant that contains Lavendar and other scents as well. Right axilla with dry desquamation. Advised she not apply any deodorant, Radiaplex or any other lotions to her right axilla for the next two days. Requested she return to nursing after her treatment on Friday to update nursing on her skin status. Patient verbalized understanding and agreement of above plan.

## 2014-01-02 ENCOUNTER — Ambulatory Visit
Admission: RE | Admit: 2014-01-02 | Discharge: 2014-01-02 | Disposition: A | Discharge status: Home / Self Care | Payer: BC Managed Care – PPO | Source: Ambulatory Visit | Attending: Radiation Oncology | Admitting: Radiation Oncology

## 2014-01-02 DIAGNOSIS — Z510 Encounter for antineoplastic radiation therapy: Secondary | ICD-10-CM | POA: Diagnosis not present

## 2014-01-05 ENCOUNTER — Ambulatory Visit
Admission: RE | Admit: 2014-01-05 | Discharge: 2014-01-05 | Disposition: A | Discharge status: Home / Self Care | Payer: BC Managed Care – PPO | Source: Ambulatory Visit | Attending: Radiation Oncology | Admitting: Radiation Oncology

## 2014-01-05 ENCOUNTER — Encounter: Payer: Self-pay | Admitting: Radiation Oncology

## 2014-01-05 VITALS — BP 108/66 | HR 75 | Temp 98.2°F | Resp 20 | Wt 124.9 lb

## 2014-01-05 DIAGNOSIS — Z510 Encounter for antineoplastic radiation therapy: Secondary | ICD-10-CM | POA: Diagnosis not present

## 2014-01-05 DIAGNOSIS — C50311 Malignant neoplasm of lower-inner quadrant of right female breast: Secondary | ICD-10-CM

## 2014-01-05 NOTE — Progress Notes (Signed)
Weekly Management Note:  Site: Left breast/chest wall boost Current Dose:  900  cGy Projected Dose: 1440  cGy  Narrative: The patient is seen today for routine under treatment assessment. CBCT/MVCT images/port films were reviewed. The chart was reviewed.   She is having significant discomfort along her right axilla with a healing moist disclamation. She has been using aloe from a planned. She is also been using Aquaphor along her axilla.  Physical Examination:  Filed Vitals:   01/05/14 1555  BP: 108/66  Pulse: 75  Temp: 98.2 F (36.8 C)  Resp: 20  .  Weight: 124 lb 14.4 oz (56.654 kg). There is a healing moist desquamation along her right axilla. She still has marked papular radiation dermatitis along the upper inner quadrant of her reconstructed right breast. There is less dermatitis along her electron beam field inferiorly.  Impression: Tolerating radiation therapy well. I will see her when she finishes her radiation therapy this Thursday.  Plan: Continue radiation therapy as planned.

## 2014-01-05 NOTE — Progress Notes (Signed)
Patient states she has been applying Aloe vera to right axilla, right breast with some relief. She states that her axilla area still "feels tight and sore, but is improving". She inquires if she can apply Aquaphor to right axilla; advised she discuss with Dr Valere Dross. Pt's right breast, axilla with hyperpigmentation, dry desquamation. She denies itching of treatment area. She will complete treatment Thurs, gave her 1 month FU card.

## 2014-01-06 ENCOUNTER — Ambulatory Visit
Admission: RE | Admit: 2014-01-06 | Discharge: 2014-01-06 | Disposition: A | Discharge status: Home / Self Care | Payer: BC Managed Care – PPO | Source: Ambulatory Visit | Attending: Radiation Oncology | Admitting: Radiation Oncology

## 2014-01-06 DIAGNOSIS — Z510 Encounter for antineoplastic radiation therapy: Secondary | ICD-10-CM | POA: Diagnosis not present

## 2014-01-07 ENCOUNTER — Ambulatory Visit
Admission: RE | Admit: 2014-01-07 | Discharge: 2014-01-07 | Disposition: A | Discharge status: Home / Self Care | Payer: BC Managed Care – PPO | Source: Ambulatory Visit | Attending: Radiation Oncology | Admitting: Radiation Oncology

## 2014-01-07 DIAGNOSIS — Z510 Encounter for antineoplastic radiation therapy: Secondary | ICD-10-CM | POA: Diagnosis not present

## 2014-01-08 ENCOUNTER — Other Ambulatory Visit: Payer: Self-pay | Admitting: Dermatology

## 2014-01-08 ENCOUNTER — Ambulatory Visit
Admission: RE | Admit: 2014-01-08 | Discharge: 2014-01-08 | Disposition: A | Discharge status: Home / Self Care | Payer: BC Managed Care – PPO | Source: Ambulatory Visit | Attending: Radiation Oncology | Admitting: Radiation Oncology

## 2014-01-08 ENCOUNTER — Encounter: Payer: Self-pay | Admitting: Obstetrics

## 2014-01-08 ENCOUNTER — Encounter: Payer: Self-pay | Admitting: Radiation Oncology

## 2014-01-08 ENCOUNTER — Ambulatory Visit (INDEPENDENT_AMBULATORY_CARE_PROVIDER_SITE_OTHER): Payer: BC Managed Care – PPO | Admitting: Obstetrics

## 2014-01-08 VITALS — BP 108/75 | HR 71 | Ht 63.0 in | Wt 123.0 lb

## 2014-01-08 DIAGNOSIS — N9489 Other specified conditions associated with female genital organs and menstrual cycle: Secondary | ICD-10-CM | POA: Insufficient documentation

## 2014-01-08 DIAGNOSIS — Z510 Encounter for antineoplastic radiation therapy: Secondary | ICD-10-CM | POA: Insufficient documentation

## 2014-01-08 DIAGNOSIS — Z01419 Encounter for gynecological examination (general) (routine) without abnormal findings: Secondary | ICD-10-CM

## 2014-01-08 DIAGNOSIS — C50911 Malignant neoplasm of unspecified site of right female breast: Secondary | ICD-10-CM | POA: Diagnosis not present

## 2014-01-08 LAB — COMPREHENSIVE METABOLIC PANEL
ALT: 21 U/L (ref 0–35)
AST: 33 U/L (ref 0–37)
Albumin: 4.5 g/dL (ref 3.5–5.2)
Alkaline Phosphatase: 87 U/L (ref 39–117)
BUN: 12 mg/dL (ref 6–23)
CO2: 29 mEq/L (ref 19–32)
Calcium: 9.8 mg/dL (ref 8.4–10.5)
Chloride: 103 mEq/L (ref 96–112)
Creat: 0.76 mg/dL (ref 0.50–1.10)
Glucose, Bld: 76 mg/dL (ref 70–99)
Potassium: 4.5 mEq/L (ref 3.5–5.3)
Sodium: 140 mEq/L (ref 135–145)
Total Bilirubin: 0.5 mg/dL (ref 0.2–1.2)
Total Protein: 6.7 g/dL (ref 6.0–8.3)

## 2014-01-08 LAB — TSH: TSH: 0.871 u[IU]/mL (ref 0.350–4.500)

## 2014-01-08 MED ORDER — IBUPROFEN 800 MG PO TABS: 800.0000 mg | ORAL_TABLET | Freq: Three times a day (TID) | ORAL | Status: DC | PRN

## 2014-01-08 NOTE — Progress Notes (Signed)
[facility] End of Treatment Note  Name:[name]  Date: 01/08/2014 KVQ:[id number] DOB:06/10/1970   Status:outpatient    CC: Chauncey Cruel, MD  , Dr. Fanny Skates  REFERRING PHYSICIAN: Dr. Gunnar Bulla Magrinat     DIAGNOSIS: Recurrent invasive ductal carcinoma of the right breast, subcutaneous recurrence (C50.91)   INDICATION FOR TREATMENT: Curative   TREATMENT DATES: 11/24/2013 through 01/08/2014                          SITE/DOSE:    Reconstructed right breast/chest wall 4500 cGy in 25 sessions followed by electron beam boost of 1440 cGy in 8 sessions.                        BEAMS/ENERGY:  Tangential fields to the reconstructed breast/chest wall   with 6 MV photons. Bolus material applied every other day to maximize the dose to the skin surface.  En face electron beam boost to her medial right breast tumor bed was 6 MEV electrons.            NARRATIVE:  The patient tolerated her treatment reasonably well with the expected degree of radiation dermatitis during course of therapy. She had a moist desquamation along her right axilla as expected. She had dry desquamation along her reconstructed right breast/chest wall.                          PLAN: Routine followup in one month. Patient instructed to call if questions or worsening complaints in interim.

## 2014-01-08 NOTE — Progress Notes (Signed)
Subjective:     [name] is a 43 y.o. female here for a routine exam.  Current complaints: none.    Personal health questionnaire:  Is patient Ashkenazi Jewish, have a family history of breast and/or ovarian cancer: no Is there a family history of uterine cancer diagnosed at age < 33, gastrointestinal cancer, urinary tract cancer, family member who is a Field seismologist syndrome-associated carrier: no Is the patient overweight and hypertensive, family history of diabetes, personal history of gestational diabetes or PCOS: no Is patient over 68, have PCOS,  family history of premature CHD under age 67, diabetes, smoke, have hypertension or peripheral artery disease:  no At any time, has a partner hit, kicked or otherwise hurt or frightened you?: no Over the past 2 weeks, have you felt down, depressed or hopeless?: no Over the past 2 weeks, have you felt little interest or pleasure in doing things?:no   Gynecologic History No LMP recorded. Patient is not currently having periods (Reason: Chemotherapy). Contraception: none Last Pap: 2014. Results were: ASCUS Last mammogram: 2014. Results were: abnormal  Obstetric History OB History  No data available    Past Medical History  Diagnosis Date  . Migraines   . Asthma     as a child  . PONV (postoperative nausea and vomiting)   . Rash     neck  . Anxiety   . Pneumonia   . Kidney stones   . GERD (gastroesophageal reflux disease)     had during chemo  . Cervical dysplasia   . Breast cancer Sept 2012    right breast, inner lower right    Past Surgical History  Procedure Laterality Date  . Rhinoplasty    . Adenoidectomy    . Fibroid tumor surgery    . Dilation and curettage of uterus    . Uterine cervical treatments  2002    for precancerous lesions  . Lazy eye      corrective surgery  . Mastectomy w/ sentinel node biopsy  06/29/2011    Procedure: MASTECTOMY WITH SENTINEL LYMPH NODE BIOPSY;  Surgeon: Adin Hector, MD;   Location: [facility];  Service: General;  Laterality: Right;  right skin spairing mstectomy and right sentinel lymph node biopsy  . Breast reconstruction  06/29/2011    Procedure: BREAST RECONSTRUCTION;  Surgeon: Theodoro Kos, DO;  Location: [facility];  Service: Plastics;  Laterality: Bilateral;  Immediate Bilateral Breast Reconstruction with Bilateral Tissue Expanders and placement of  Alloderm   . Port-a-cath removal  03/12/2012    Procedure: MINOR REMOVAL PORT-A-CATH;  Surgeon: Adin Hector, MD;  Location: [facility];  Service: General;  Laterality: N/A;  Upper left  . Breast lumpectomy with needle localization Right 05/05/2013    Procedure: EXCISION RECURRENT CANCER RIGHT BREAST WITH NEEDLE LOCALOZATION;  Surgeon: Adin Hector, MD;  Location: [facility];  Service: General;  Laterality: Right;  . Portacath placement Left 05/05/2013    Procedure: INSERTION PORT-A-CATH;  Surgeon: Adin Hector, MD;  Location: [place];  Service: General;  Laterality: Left;    Current outpatient prescriptions:aspirin-acetaminophen-caffeine (EXCEDRIN MIGRAINE) 767-341-93 MG per tablet, Take 2 tablets by mouth every 6 (six) hours as needed for headache., Disp: , Rfl: ;  Biotin 5000 MCG CAPS, Take 5,000 mcg by mouth daily., Disp: , Rfl: ;  chlorpheniramine (ALLERGY) 4 MG tablet, Take by mouth., Disp: , Rfl:  ibuprofen (ADVIL,MOTRIN) 800 MG tablet, Take 1 tablet (800 mg total) by mouth every 8 (eight) hours  as needed., Disp: 60 tablet, Rfl: 5;  LORazepam (ATIVAN) 0.5 MG tablet, Take 1 tablet (0.5 mg total) by mouth 2 (two) times daily as needed for anxiety., Disp: 60 tablet, Rfl: 0;  prenatal vitamin w/FE, FA (PRENATAL 1 + 1) 27-1 MG TABS, Take 1 tablet by mouth Daily., Disp: , Rfl:  traMADol (ULTRAM) 50 MG tablet, TAKE ONE TABLET BY MOUTH EVERY 6 HOURS AS NEEDED FOR MODERATE PAIN, Disp: 30 tablet, Rfl: 1;  doxycycline (VIBRA-TABS) 100 MG tablet, Take 1 tablet (100 mg total) by mouth 2 (two) times daily., Disp: 30  tablet, Rfl: 6;  emollient (BIAFINE) cream, Apply topically 2 (two) times daily., Disp: , Rfl: ;  fluconazole (DIFLUCAN) 150 MG tablet, , Disp: , Rfl:  hydrOXYzine (ATARAX/VISTARIL) 10 MG tablet, Take 1 tablet (10 mg total) by mouth 3 (three) times daily as needed., Disp: 30 tablet, Rfl: 3;  Multiple Vitamin (MULTIVITAMIN) tablet, Take 1 tablet by mouth daily., Disp: , Rfl: ;  Multiple Vitamins-Minerals (HAIR/SKIN/NAILS PO), Take 1 tablet by mouth daily. , Disp: , Rfl: ;  Wound Dressings (RADIAGEL) GEL, Apply topically 2 (two) times daily., Disp: , Rfl:  Allergies  Allergen Reactions  . Tussionex Pennkinetic Er [Hydrocod Polst-Cpm Polst Er] Other (See Comments)    hallucinations  . Codeine Other (See Comments)    unkown  . Morphine Rash  . Morphine And Related Anxiety    anxiety  . Penicillin G Rash  . Penicillins Rash    History  Substance Use Topics  . Smoking status: Former Smoker    Quit date: 07/20/1996  . Smokeless tobacco: Never Used  . Alcohol Use: No    Family History  Problem Relation Age of Onset  . Adopted: Yes  . Heart attack Father   . Cancer Cousin 40    melanoma; mat first cousin, located on neck  . Cancer Maternal Aunt 60    stomach and ovarian as separate primaries  . Cancer Maternal Uncle 65    lung; smoker  . Cancer Maternal Grandmother 34    enodometrial  . Cancer Maternal Aunt 70    breast  . Cancer Maternal Uncle 63    prostate      Review of Systems  Constitutional: negative for fatigue and weight loss Respiratory: negative for cough and wheezing Cardiovascular: negative for chest pain, fatigue and palpitations Gastrointestinal: negative for abdominal pain and change in bowel habits Musculoskeletal:negative for myalgias Neurological: negative for gait problems and tremors Behavioral/Psych: negative for abusive relationship.  Positive for depression / anxiety. Endocrine: negative for temperature intolerance   Genitourinary:negative for genital  lesions and vaginal discharge Integument/breast: skin changes from radiation to breasts.    Objective:       BP 108/75  Pulse 71  Ht 5\' 3"  (1.6 m)  Wt 123 lb (55.792 kg)  BMI 21.79 kg/m2 General:   alert  Skin:   no rash or abnormalities  Lungs:   clear to auscultation bilaterally  Heart:   regular rate and rhythm, S1, S2 normal, no murmur, click, rub or gallop  Breasts:   Surgical scars present from mastectomies and reconstructive surgeries.  Abdomen:  normal findings: no organomegaly, soft, non-tender and no hernia  Pelvis:  External genitalia: normal general appearance Urinary system: urethral meatus normal and bladder without fullness, nontender Vaginal: normal without tenderness, induration or masses Cervix: normal appearance Adnexa: normal bimanual exam Uterus: anteverted and non-tender, normal size   Lab Review Urine pregnancy test Labs reviewed yes Radiologic studies reviewed  yes    Assessment:    Healthy female exam.   S/P breast CA recurrence and treatment this year.   Plan:    Education reviewed: depression evaluation, self breast exams and skin cancer screening. Follow up in: 1 year.   Meds ordered this encounter  Medications  . ibuprofen (ADVIL,MOTRIN) 800 MG tablet    Sig: Take 1 tablet (800 mg total) by mouth every 8 (eight) hours as needed.    Dispense:  60 tablet    Refill:  5   Orders Placed This Encounter  Procedures  . WET PREP BY MOLECULAR PROBE  . GC/Chlamydia Probe Amp  . CBC with Differential  . Comprehensive metabolic panel  . TSH  . Vit D  25 hydroxy (rtn osteoporosis monitoring)

## 2014-01-08 NOTE — Progress Notes (Signed)
Weekly Management Note:  Site: Breast/chest wall boost Current Dose:  1440  cGy Projected Dose: 1400  cGy  Narrative: The patient is seen today for routine under treatment assessment. CBCT/MVCT images/port films were reviewed. The chart was reviewed.   She is without new complaints today.  Physical Examination: There were no vitals filed for this visit..  Weight:  . There is reepithelialization of her desquamation along her right axilla. There is less erythema along her right breast. No areas of moist desquamation with patchy dry desquamation.  Impression: Radiation therapy completed.  Plan: Follow visit in one month.

## 2014-01-09 ENCOUNTER — Ambulatory Visit: Payer: BC Managed Care – PPO

## 2014-01-09 LAB — GC/CHLAMYDIA PROBE AMP
CT Probe RNA: NEGATIVE
GC Probe RNA: NEGATIVE

## 2014-01-09 LAB — CBC WITH DIFFERENTIAL/PLATELET
Basophils Absolute: 0 10*3/uL (ref 0.0–0.1)
Basophils Relative: 0 % (ref 0–1)
Eosinophils Absolute: 0.1 10*3/uL (ref 0.0–0.7)
Eosinophils Relative: 1 % (ref 0–5)
HCT: 38.7 % (ref 36.0–46.0)
Hemoglobin: 13.2 g/dL (ref 12.0–15.0)
Lymphocytes Relative: 21 % (ref 12–46)
Lymphs Abs: 1.3 10*3/uL (ref 0.7–4.0)
MCH: 30 pg (ref 26.0–34.0)
MCHC: 34.1 g/dL (ref 30.0–36.0)
MCV: 88 fL (ref 78.0–100.0)
Monocytes Absolute: 0.4 10*3/uL (ref 0.1–1.0)
Monocytes Relative: 6 % (ref 3–12)
Neutro Abs: 4.3 10*3/uL (ref 1.7–7.7)
Neutrophils Relative %: 72 % (ref 43–77)
Platelets: 207 10*3/uL (ref 150–400)
RBC: 4.4 MIL/uL (ref 3.87–5.11)
RDW: 13.7 % (ref 11.5–15.5)
WBC: 6 10*3/uL (ref 4.0–10.5)

## 2014-01-09 LAB — VITAMIN D 25 HYDROXY (VIT D DEFICIENCY, FRACTURES): Vit D, 25-Hydroxy: 50 ng/mL (ref 30–89)

## 2014-01-09 LAB — WET PREP BY MOLECULAR PROBE
Candida species: POSITIVE — AB
Gardnerella vaginalis: POSITIVE — AB
Trichomonas vaginosis: NEGATIVE

## 2014-01-12 ENCOUNTER — Ambulatory Visit: Payer: BC Managed Care – PPO

## 2014-01-12 LAB — PAP IG AND HPV HIGH-RISK: HPV DNA High Risk: NOT DETECTED

## 2014-01-19 NOTE — Telephone Encounter (Signed)
NONE 

## 2014-01-20 ENCOUNTER — Other Ambulatory Visit: Payer: Self-pay | Admitting: Obstetrics

## 2014-01-20 ENCOUNTER — Telehealth: Payer: Self-pay | Admitting: Oncology

## 2014-01-20 NOTE — Telephone Encounter (Signed)
Pt called to ask about PAP and Labs  result.  [name]

## 2014-01-22 ENCOUNTER — Other Ambulatory Visit: Payer: Self-pay | Admitting: Obstetrics

## 2014-01-22 DIAGNOSIS — B3731 Acute candidiasis of vulva and vagina: Secondary | ICD-10-CM

## 2014-01-22 DIAGNOSIS — B373 Candidiasis of vulva and vagina: Secondary | ICD-10-CM

## 2014-01-22 MED ORDER — FLUCONAZOLE 150 MG PO TABS: 150.0000 mg | ORAL_TABLET | Freq: Once | ORAL | Status: DC

## 2014-01-23 ENCOUNTER — Other Ambulatory Visit: Payer: Self-pay | Admitting: Oncology

## 2014-01-23 DIAGNOSIS — C50311 Malignant neoplasm of lower-inner quadrant of right female breast: Secondary | ICD-10-CM

## 2014-01-26 ENCOUNTER — Other Ambulatory Visit: Payer: Self-pay | Admitting: *Deleted

## 2014-01-26 DIAGNOSIS — C50311 Malignant neoplasm of lower-inner quadrant of right female breast: Secondary | ICD-10-CM

## 2014-01-27 ENCOUNTER — Other Ambulatory Visit: Payer: Self-pay | Admitting: *Deleted

## 2014-02-04 ENCOUNTER — Other Ambulatory Visit: Payer: Self-pay | Admitting: *Deleted

## 2014-02-04 ENCOUNTER — Other Ambulatory Visit (HOSPITAL_BASED_OUTPATIENT_CLINIC_OR_DEPARTMENT_OTHER): Payer: BC Managed Care – PPO

## 2014-02-04 DIAGNOSIS — C50311 Malignant neoplasm of lower-inner quadrant of right female breast: Secondary | ICD-10-CM

## 2014-02-04 DIAGNOSIS — C50911 Malignant neoplasm of unspecified site of right female breast: Secondary | ICD-10-CM

## 2014-02-04 DIAGNOSIS — C50912 Malignant neoplasm of unspecified site of left female breast: Secondary | ICD-10-CM

## 2014-02-04 LAB — COMPREHENSIVE METABOLIC PANEL (CC13)
ALT: 22 U/L (ref 0–55)
AST: 30 U/L (ref 5–34)
Albumin: 4.6 g/dL (ref 3.5–5.0)
Alkaline Phosphatase: 109 U/L (ref 40–150)
Anion Gap: 10 mEq/L (ref 3–11)
BUN: 11.1 mg/dL (ref 7.0–26.0)
CO2: 27 mEq/L (ref 22–29)
Calcium: 10.1 mg/dL (ref 8.4–10.4)
Chloride: 104 mEq/L (ref 98–109)
Creatinine: 0.8 mg/dL (ref 0.6–1.1)
Glucose: 84 mg/dl (ref 70–140)
Potassium: 3.9 mEq/L (ref 3.5–5.1)
Sodium: 141 mEq/L (ref 136–145)
Total Bilirubin: 0.65 mg/dL (ref 0.20–1.20)
Total Protein: 7.4 g/dL (ref 6.4–8.3)

## 2014-02-04 LAB — CBC WITH DIFFERENTIAL/PLATELET
BASO%: 0.2 % (ref 0.0–2.0)
Basophils Absolute: 0 10*3/uL (ref 0.0–0.1)
EOS%: 1.3 % (ref 0.0–7.0)
Eosinophils Absolute: 0.1 10*3/uL (ref 0.0–0.5)
HCT: 40.3 % (ref 34.8–46.6)
HGB: 13.7 g/dL (ref 11.6–15.9)
LYMPH%: 25.5 % (ref 14.0–49.7)
MCH: 30.8 pg (ref 25.1–34.0)
MCHC: 34 g/dL (ref 31.5–36.0)
MCV: 90.6 fL (ref 79.5–101.0)
MONO#: 0.3 10*3/uL (ref 0.1–0.9)
MONO%: 6.2 % (ref 0.0–14.0)
NEUT#: 3.6 10*3/uL (ref 1.5–6.5)
NEUT%: 66.8 % (ref 38.4–76.8)
Platelets: 212 10*3/uL (ref 145–400)
RBC: 4.45 10*6/uL (ref 3.70–5.45)
RDW: 13.3 % (ref 11.2–14.5)
WBC: 5.3 10*3/uL (ref 3.9–10.3)
lymph#: 1.4 10*3/uL (ref 0.9–3.3)

## 2014-02-04 LAB — URINALYSIS, MICROSCOPIC - CHCC
Bilirubin (Urine): NEGATIVE
Blood: NEGATIVE
Glucose: NEGATIVE mg/dL
Ketones: NEGATIVE mg/dL
Leukocyte Esterase: NEGATIVE
Nitrite: NEGATIVE
Protein: NEGATIVE mg/dL
RBC / HPF: NEGATIVE (ref 0–2)
Specific Gravity, Urine: 1.005 (ref 1.003–1.035)
Urobilinogen, UR: 0.2 mg/dL (ref 0.2–1)
pH: 7 (ref 4.6–8.0)

## 2014-02-05 ENCOUNTER — Other Ambulatory Visit: Payer: Self-pay | Admitting: Emergency Medicine

## 2014-02-05 LAB — URINE CULTURE

## 2014-02-05 LAB — FOLLICLE STIMULATING HORMONE: FSH: 127.9 m[IU]/mL — ABNORMAL HIGH

## 2014-02-08 LAB — ESTRADIOL, ULTRA SENS: Estradiol, Ultra Sensitive: 20 pg/mL

## 2014-02-10 ENCOUNTER — Ambulatory Visit: Payer: Self-pay | Admitting: Radiation Oncology

## 2014-02-11 ENCOUNTER — Ambulatory Visit: Payer: BC Managed Care – PPO | Admitting: Oncology

## 2014-02-20 ENCOUNTER — Encounter: Payer: Self-pay | Admitting: *Deleted

## 2014-02-24 ENCOUNTER — Ambulatory Visit (HOSPITAL_BASED_OUTPATIENT_CLINIC_OR_DEPARTMENT_OTHER): Payer: BC Managed Care – PPO | Admitting: Oncology

## 2014-02-24 ENCOUNTER — Encounter: Payer: Self-pay | Admitting: Radiation Oncology

## 2014-02-24 ENCOUNTER — Ambulatory Visit
Admission: RE | Admit: 2014-02-24 | Discharge: 2014-02-24 | Disposition: A | Discharge status: Home / Self Care | Payer: BC Managed Care – PPO | Source: Ambulatory Visit | Attending: Radiation Oncology | Admitting: Radiation Oncology

## 2014-02-24 VITALS — BP 100/67 | HR 90 | Temp 98.4°F | Resp 20 | Wt 123.8 lb

## 2014-02-24 VITALS — BP 98/67 | HR 84 | Temp 97.9°F | Resp 18 | Ht 63.0 in | Wt 123.0 lb

## 2014-02-24 DIAGNOSIS — C50311 Malignant neoplasm of lower-inner quadrant of right female breast: Secondary | ICD-10-CM

## 2014-02-24 DIAGNOSIS — C50911 Malignant neoplasm of unspecified site of right female breast: Secondary | ICD-10-CM

## 2014-02-24 DIAGNOSIS — Z170 Estrogen receptor positive status [ER+]: Secondary | ICD-10-CM

## 2014-02-24 DIAGNOSIS — C50912 Malignant neoplasm of unspecified site of left female breast: Secondary | ICD-10-CM

## 2014-02-24 MED ORDER — PROCHLORPERAZINE MALEATE 10 MG PO TABS: 10.0000 mg | ORAL_TABLET | Freq: Four times a day (QID) | ORAL | Status: DC | PRN

## 2014-02-24 MED ORDER — ANASTROZOLE 1 MG PO TABS: 1.0000 mg | ORAL_TABLET | Freq: Every day | ORAL | Status: DC

## 2014-02-24 MED ORDER — DEXAMETHASONE 4 MG PO TABS: 8.0000 mg | ORAL_TABLET | Freq: Two times a day (BID) | ORAL | Status: DC

## 2014-02-24 MED ORDER — DEXAMETHASONE 4 MG PO TABS
ORAL_TABLET | ORAL | Status: AC
  Filled 2014-02-24: qty 2

## 2014-02-24 MED ORDER — TRAMADOL HCL 50 MG PO TABS: ORAL_TABLET | ORAL | Status: DC

## 2014-02-24 NOTE — Progress Notes (Signed)
Patient reports 2 weeks of lower back pain. She states it "radiates up to her mid back and around to her abdomen". She states it can be 7-8/10 on pain scale. She states it is "like a back ache". She takes Tramadol prn and states the pain does go away completely at times. She also reports occasional nausea. Patient denies issues with her right breast, states her skin has healed. She states she is still fatigued.

## 2014-02-24 NOTE — Progress Notes (Signed)
Follow-up note:  [name] returns today approximately 6 weeks following completion of radiation therapy to her constructed right breast/chest wall following conservative surgery for a subcutaneous recurrence. She is generally doing well, however she has a one-week history of low back pain. She states that this is worse with sitting, and is positional. The quality of her pain is dull and aching. It is bilateral along her lower back. Sometimes her pain is worse with deep inspiration. She will see Dr. Jana Hakim later today.  Physical examination: Alert and oriented. Filed Vitals:   02/24/14 1553  BP: 100/67  Pulse: 90  Temp: 98.4 F (36.9 C)  Resp: 20   Nodes: There is no palpable cervical, supraclavicular, or axillary lymphadenopathy. I do feel a 4-5 mm node within the right axilla which is unchanged. Breasts: There is residual hyperpigmentation of the skin along the right breast with no visible or palpable evidence for recurrent disease. Cosmesis remains excellent. There is no significant fibrosis. Abdomen: Without masses organomegaly. Back: There is no palpable spinal discomfort. There is pain described along the lower lumbar spine.  Impression: Satisfactory progress following her radiation therapy, however she has a one-week history of new pain along her lower back. A chest wall recurrence certainly places her at increased risk for distant metastases. She is scheduled for a PET scan on December 29, Dr. Jana Hakim may simply move this up for restaging. Otherwise, he may obtain plain films and or MRI scan of the lumbar spine. I defer to Dr. Jana Hakim. I understand that she is being considered for additional antiestrogen therapy through Dr. Jana Hakim.  Plan: As above. Follow-up through Dr. Jana Hakim.

## 2014-02-24 NOTE — Progress Notes (Signed)
ID: [name]   DOB: [birth year]  MR#: [id number]  LNL#:[id number]   PCP: Chauncey Cruel, MD GYN: Baltazar Najjar MD SU: Fanny Skates MD OTHER MD: Theodoro Kos, [name], [name]  CHIEF COMPLAINT:  Recurrent Breast Cancer, Right CURRENT THERAPY: To start anastrozole   BREAST CANCER HISTORY:  From the original intake note:  "[name] (goes by "[name]") is a [place] woman who, at the age of 43, was referred by Dr. Baltazar Najjar for treatment of right breast carcinoma.   1.  The patient palpated a mass in her right breast which she brought to Dr. Jacelyn Grip attention. A prior screening mammogram on 02/06/2010 had been unremarkable. The patient was then scheduled for bilateral diagnostic mammogram and right breast ultrasonography on 12/22/2010, the studies demonstrating an ill defined mass with heterogeneous calcifications in the inner lower right breast, measuring 1.7 cm. The breasts are heterogeneously dense, but there were no other mammographic abnormalities in either breast. Clinically, there was a palpable firm mass at the 5:00 position of the right breast, 5 cm from the nipple. There were no palpable abnormalities in the right axilla. Ultrasound confirmed a 1.7 cm irregular, hypoechoic mass in the same position. There were some lymph nodes noted in the right exam of, one measuring 1.6 cm with a minimally thickened cortex inferiorly.  An ultrasound-guided core biopsy on 12/23/2010 [id number]) showed an invasive ductal carcinoma, grade 2, ER positive at 88%, PR positive at 11 is %, HER-2/neu positive with a FISH ratio of 4.61, and an MIB-1 of 36%.  The patient was referred to Dr. Fanny Skates and bilateral breast MRIs were obtained 12/30/2010. This showed the mass in question in the right breast to measure 2.0 cm. In the right axilla there was a 1.8 cm lymph node with a thickened cortex. The left breast was unremarkable.  Her subsequent treatment is as  detailed below.  2.   [name] then had a restaging PET scan late December 2014 which was positive only for a small subcutaneous nodule in the right breast lower outer quadrant. There was no other finding of concern. Ultrasound of both breasts 04/08/2013 showed a nodule in the right breast at the 4:00 position measuring 0.7 cm. The right axilla was clear. There was an area at the 7:00 position of the left breast which was of concern by palpation but showed only normal tissue.   Biopsy of the right breast mass 04/09/2013 showed an invasive ductal carcinoma, grade 2, estrogen receptor 94% positive, progesterone receptor 11% positive, with an MIB-1 of 19% and HER-2 amplified with a signals ratio of 3.75 and the number per cell of 4.50. The patient discussed the situation with with her surgeon's and on 05/05/2013 proceeded to right lumpectomy, with the final pathology (S. is a [id number]) confirming an invasive ductal carcinoma, 0.7 cm, grade 2, within a millimeter of the posterior margin, which showed skeletal muscle. A port was placed at the time of that procedure."  Her subsequent treatment is as detailed below.  INTERVAL HISTORY:  [name] returns today for followup of her recurrent right breast cancer. Physical last visit here she has completed her radiation treatments. She generally did well with these. We are now ready to start anti-estrogens and with a reviewed towards that we checked her Gueydan and estradiol levels. They are clearly in the menopausal range. She is having some hot flashes although she says they are if anything a little bit better. She is also having "a little bit"  of vaginal dryness problems.  REVIEW OF SYSTEMS:  The big problem she is having however is low back pain. This radiates down both flanks towards the front. It is relieved by lying down. It is worse with sitting or standing. It has been present about 2 weeks. She has been treating it with over-the-counter medication, particularly  ibuprofen, with very little success.there has been no change in bowel or bladder habits. She has occasional blurred vision, which is not constant, and she has rare headaches. These are not more intense or persistent than before. She feels anxious but not depressed. She has been able to continue to work despite the back problem. A detailed review of systems today was otherwise noncontributory  PAST MEDICAL HISTORY: Past Medical History  Diagnosis Date  . Migraines   . Asthma     as a child  . PONV (postoperative nausea and vomiting)   . Rash     neck  . Anxiety   . Pneumonia   . Kidney stones   . GERD (gastroesophageal reflux disease)     had during chemo  . Cervical dysplasia   . Breast cancer Sept 2012    right breast, inner lower right  . Hx of radiation therapy 11/24/13- 01/08/14    reconstructed right breast/chest wall 4500 cGy 25 sessions, electron beam boost 1440 cGy 8 sessions  1. History of prior adenoidectomy. 2. History of rhinoplasty.   3. History of multiple uterine cervical treatments for precancerous lesions, none for the past 10 years or so. 4. History of fibroid tumor surgery. 5. History of D and C. 6. History of dysplastic nevi removed by Dr. Ronnald Ramp. 7. History of right "lazy eye" corrective surgery. 8. History of approximately 10-pack-years of tobacco abuse, resolved.  9. History of possible migraines (morning headaches). History of asthma as a child.  PAST SURGICAL HISTORY: Past Surgical History  Procedure Laterality Date  . Rhinoplasty    . Adenoidectomy    . Fibroid tumor surgery    . Dilation and curettage of uterus    . Uterine cervical treatments  2002    for precancerous lesions  . Lazy eye      corrective surgery  . Mastectomy w/ sentinel node biopsy  06/29/2011    Procedure: MASTECTOMY WITH SENTINEL LYMPH NODE BIOPSY;  Surgeon: Adin Hector, MD;  Location: [facility];  Service: General;  Laterality: Right;  right skin spairing mstectomy and right  sentinel lymph node biopsy  . Breast reconstruction  06/29/2011    Procedure: BREAST RECONSTRUCTION;  Surgeon: Theodoro Kos, DO;  Location: [facility];  Service: Plastics;  Laterality: Bilateral;  Immediate Bilateral Breast Reconstruction with Bilateral Tissue Expanders and placement of  Alloderm   . Port-a-cath removal  03/12/2012    Procedure: MINOR REMOVAL PORT-A-CATH;  Surgeon: Adin Hector, MD;  Location: [facility];  Service: General;  Laterality: N/A;  Upper left  . Breast lumpectomy with needle localization Right 05/05/2013    Procedure: EXCISION RECURRENT CANCER RIGHT BREAST WITH NEEDLE LOCALOZATION;  Surgeon: Adin Hector, MD;  Location: [facility];  Service: General;  Laterality: Right;  . Portacath placement Left 05/05/2013    Procedure: INSERTION PORT-A-CATH;  Surgeon: Adin Hector, MD;  Location: [facility];  Service: General;  Laterality: Left;    FAMILY HISTORY Family History  Problem Relation Age of Onset  . Adopted: Yes  . Heart attack Father   . Cancer Cousin 70    melanoma; mat first cousin, located  on neck  . Cancer Maternal Aunt 60    stomach and ovarian as separate primaries  . Cancer Maternal Uncle 65    lung; smoker  . Cancer Maternal Grandmother 26    enodometrial  . Cancer Maternal Aunt 55    breast  . Cancer Maternal Uncle 63    prostate  :  The patient's biological father died at the age of 27 from myocardial infarction.  The patient's biological mother is alive at age 98.  The patient was adopted by her biological mother's parents.  She has 3 half-brothers.  No sisters.  One cousin on her mother's side died from melanoma at the age of 50.  There is other cancer history in the adopted side but these are not even half-brothers or sisters, they are children of her adopted parents who are really her grandparents so these would be uncles and aunts (1 lymph node cancer, 1 prostate cancer, 1 breast cancer at the age of 15).  GYNECOLOGIC HISTORY:    (Updated 05/22/2013) She had menarche age 6 or 83.  She is GX, P1, first pregnancy to term at age 32. She stopped having periods approximately March 2013.  Hormone levels in February 2015 show patient to be premenopausal, with an estradiol of 113 on 05/15/2013, and her periods indeed resumed February of 2015  SOCIAL HISTORY: (Updated 06/12/2013) She works in a Engineer, agricultural.  Her husband, Onalee Steinbach owns a family business called [employer].  They have been married 5 years.  They have a son, Kellie Simmering, age 74. Romilda Joy also has a daughter, Jocee Kissick, who lives in [place] and is 44 years old.  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  The patient is not a church attender.    ADVANCED DIRECTIVES: Not in place  HEALTH MAINTENANCE:  (Updated 06/12/2013) History  Substance Use Topics  . Smoking status: Former Smoker    Quit date: 07/20/1996  . Smokeless tobacco: Never Used  . Alcohol Use: No     Colonoscopy:  Never  PAP: Oct 2014/[facility]  Bone density: Oct 2014, mild osteopenia with a T score of -1.1  Lipid panel: Not on file  Allergies  Allergen Reactions  . Tussionex Pennkinetic Er [Hydrocod Polst-Cpm Polst Er] Other (See Comments)    hallucinations  . Codeine Other (See Comments)    unkown  . Morphine Rash  . Morphine And Related Anxiety    anxiety  . Penicillin G Rash  . Penicillins Rash    Current Outpatient Prescriptions  Medication Sig Dispense Refill  . aspirin-acetaminophen-caffeine (EXCEDRIN MIGRAINE) 250-250-65 MG per tablet Take 2 tablets by mouth every 6 (six) hours as needed for headache.    . Biotin 5000 MCG CAPS Take 5,000 mcg by mouth daily.    . chlorpheniramine (ALLERGY) 4 MG tablet Take by mouth.    . doxycycline (VIBRA-TABS) 100 MG tablet Take 1 tablet (100 mg total) by mouth 2 (two) times daily. 30 tablet 6  . hydrOXYzine (ATARAX/VISTARIL) 10 MG tablet Take 1 tablet (10 mg total) by mouth 3 (three) times daily as needed. 30 tablet 3  . ibuprofen  (ADVIL,MOTRIN) 800 MG tablet Take 1 tablet (800 mg total) by mouth every 8 (eight) hours as needed. 60 tablet 5  . LORazepam (ATIVAN) 0.5 MG tablet Take 1 tablet (0.5 mg total) by mouth 2 (two) times daily as needed for anxiety. 60 tablet 0  . Multiple Vitamin (MULTIVITAMIN) tablet Take 1 tablet by mouth daily.    [name]  Multiple Vitamins-Minerals (HAIR/SKIN/NAILS PO) Take 1 tablet by mouth daily.     . prenatal vitamin w/FE, FA (PRENATAL 1 + 1) 27-1 MG TABS Take 1 tablet by mouth Daily.    . traMADol (ULTRAM) 50 MG tablet TAKE ONE TABLET BY MOUTH EVERY 6 HOURS AS NEEDED FOR MODERATE PAIN 30 tablet 0   No current facility-administered medications for this visit.    OBJECTIVE: Middle-aged white woman  Filed Vitals:   02/24/14 1644  BP: 98/67  Pulse: 84  Temp: 97.9 F (36.6 C)  Resp: 18     Body mass index is 21.79 kg/(m^2).    ECOG FS: 2 Filed Weights   02/24/14 1644  Weight: 123 lb (55.792 kg)   Sclerae unicteric, Pupils round and reactive Oropharynx clear, dentition in good repair No cervical or supraclavicular adenopathy Lungs no rales or rhonchi Heart regular rate and rhythm Abd soft, positive bowel sounds; nontender MSK mild focal spinal tenderness around L1 or T12, no upper extremity lymphedema Neuro: nonfocal, well oriented, anxious affect Breasts: Status post bilateral mastectomies with implant reconstruction. There is no evidence of local recurrence. Both axillae are benign.  LAB RESULTS: Lab Results  Component Value Date   WBC 5.3 02/04/2014   NEUTROABS 3.6 02/04/2014   HGB 13.7 02/04/2014   HCT 40.3 02/04/2014   MCV 90.6 02/04/2014   PLT 212 02/04/2014      Chemistry      Component Value Date/Time   NA 141 02/04/2014 1535   NA 140 01/08/2014 1459   K 3.9 02/04/2014 1535   K 4.5 01/08/2014 1459   CL 103 01/08/2014 1459   CL 104 09/23/2012 1415   CO2 27 02/04/2014 1535   CO2 29 01/08/2014 1459   BUN 11.1 02/04/2014 1535   BUN 12 01/08/2014 1459   CREATININE  0.8 02/04/2014 1535   CREATININE 0.76 01/08/2014 1459   CREATININE 0.70 05/05/2013 1059      Component Value Date/Time   CALCIUM 10.1 02/04/2014 1535   CALCIUM 9.8 01/08/2014 1459   ALKPHOS 109 02/04/2014 1535   ALKPHOS 87 01/08/2014 1459   AST 30 02/04/2014 1535   AST 33 01/08/2014 1459   ALT 22 02/04/2014 1535   ALT 21 01/08/2014 1459   BILITOT 0.65 02/04/2014 1535   BILITOT 0.5 01/08/2014 1459     Results for ALEA, RYER (MRN [id number]) as of 02/25/2014 07:48  Ref. Range 03/25/2012 14:59 05/15/2013 11:25 12/08/2013 15:29 02/04/2014 15:35  FSH No range found 22.9 36.1 122.7 (H) 127.9 (H)   Results for DOMINGUE, COLTRAIN (MRN [id number]) as of 02/25/2014 07:48  Ref. Range 03/25/2012 14:59 05/15/2013 11:25 12/08/2013 15:29 02/04/2014 15:36  Estradiol, Ultra Sensitive No range found 823 113 19 20   STUDIES: No results found.  ASSESSMENT: 43 y.o.  BRCA 1-2 negative [name], [place] woman,   (1) status post right breast biopsy in September 2012 for a clinically 2.0 cm invasive ductal carcinoma, with some enlarged Right axillary lymph nodes, the largest one of which was negative by biopsy. The tumor was grade 2, estrogen receptor positive at 88%, progesterone receptor positive at 11%, HER-2/neu positive by CISH with a ratio of 4.61, and a proliferation marker of 36%.   (2) treated in the neoadjuvant setting with Q 3 week docetaxel/ carboplatin/ trastuzumab x6, completed 05/18/2011.  Continued on trastuzumab every 3 weeks to complete one year, last dose in November 2013.  (3) s/p bilateral mastectomies with sentinel node biopsy 06/29/2011 for a residual right-sided ypT1c ypN0  invasive ductal carcinoma, grade 2, with immediate expander placement  (4) on tamoxifen as of May 2013, discontinued in early 2015 due to breast cancer recurrence   (5) status post left lumpectomy with left-sided sentinel lymph node sampling 05/05/2013 for a pT1b pN0, stage IA invasive ductal carcinoma, estrogen  receptor 94% positive, progesterone receptor 11% positive, with an MIB-1 of 19% and HER-2 amplification  (6)  Received TDM-1 every 3 weeks x 8 doses completed 10/09/2013; echo 08/25/2013 showed a well preserved ejection fraction  (7) adjuvant radiation therapy completed 01/08/2014  (8) to start anastrozole;   (a) [place] and estradiol in menopausal range 02/04/2014, to be followed every 3 months  (b) bone density 01/15/2013 showed a T score of -1.1  (9) 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   PLAN:  Adean Milosevic is having significant back pain, which is not easily explainable through recent activity, or trauma. This is certainly worrisome and I am setting her up for MRI of the thoracic and lumbar spine within the next 24 hours. In the meantime I am starting her on dexamethasone 8 mg twice daily. As far as the pain is concerned she may take ibuprofen and tramadol. The tramadol was refilled today.  We also discussed her menopausal status. Her [place] is clearly in the menopausal range. We are ready to start anastrozole and we discussed the possible toxicities, side effects and complications of this agent. However I would like to have the back problem evaluated and resolved before starting a new agent. The prescription has been placed but she will not start it yet.   She has a good understanding of the overall plan. She agrees with it. She will call with any problems that may develop before her next visit, Which will be within the next 2 weeks.   Chauncey Cruel, MD     02/24/2014

## 2014-02-25 ENCOUNTER — Telehealth: Payer: Self-pay | Admitting: Oncology

## 2014-02-25 NOTE — Addendum Note (Signed)
Addended by: Laureen Abrahams on: 02/25/2014 05:23 PM   Modules accepted: Medications

## 2014-02-25 NOTE — Telephone Encounter (Signed)
perpof to sch pt-per Kathrine Cords vant override because double booking-sent emailt o GM

## 2014-02-25 NOTE — Telephone Encounter (Signed)
s.w pt regarding to [facility] appt on 12.2 @ 5pm...pt ok adn aware...MD or Am will add appt

## 2014-02-26 ENCOUNTER — Other Ambulatory Visit: Payer: Self-pay | Admitting: Oncology

## 2014-03-11 ENCOUNTER — Ambulatory Visit (HOSPITAL_BASED_OUTPATIENT_CLINIC_OR_DEPARTMENT_OTHER): Payer: BC Managed Care – PPO | Admitting: Oncology

## 2014-03-11 ENCOUNTER — Ambulatory Visit (HOSPITAL_COMMUNITY)
Admission: RE | Admit: 2014-03-11 | Discharge: 2014-03-11 | Disposition: A | Discharge status: Home / Self Care | Payer: BC Managed Care – PPO | Source: Ambulatory Visit | Attending: Oncology | Admitting: Oncology

## 2014-03-11 ENCOUNTER — Other Ambulatory Visit: Payer: Self-pay | Admitting: Oncology

## 2014-03-11 VITALS — BP 110/68 | HR 77

## 2014-03-11 DIAGNOSIS — M545 Low back pain: Secondary | ICD-10-CM | POA: Insufficient documentation

## 2014-03-11 DIAGNOSIS — R202 Paresthesia of skin: Secondary | ICD-10-CM | POA: Diagnosis not present

## 2014-03-11 DIAGNOSIS — C50311 Malignant neoplasm of lower-inner quadrant of right female breast: Secondary | ICD-10-CM

## 2014-03-11 DIAGNOSIS — M79604 Pain in right leg: Secondary | ICD-10-CM | POA: Insufficient documentation

## 2014-03-11 DIAGNOSIS — D1800 Hemangioma unspecified site: Secondary | ICD-10-CM | POA: Insufficient documentation

## 2014-03-11 DIAGNOSIS — Z853 Personal history of malignant neoplasm of breast: Secondary | ICD-10-CM | POA: Insufficient documentation

## 2014-03-11 DIAGNOSIS — C50911 Malignant neoplasm of unspecified site of right female breast: Secondary | ICD-10-CM

## 2014-03-11 DIAGNOSIS — M5032 Other cervical disc degeneration, mid-cervical region: Secondary | ICD-10-CM | POA: Diagnosis not present

## 2014-03-11 DIAGNOSIS — Z79811 Long term (current) use of aromatase inhibitors: Secondary | ICD-10-CM

## 2014-03-11 DIAGNOSIS — R200 Anesthesia of skin: Secondary | ICD-10-CM | POA: Insufficient documentation

## 2014-03-11 DIAGNOSIS — C50912 Malignant neoplasm of unspecified site of left female breast: Secondary | ICD-10-CM

## 2014-03-11 DIAGNOSIS — M79605 Pain in left leg: Secondary | ICD-10-CM | POA: Insufficient documentation

## 2014-03-11 MED ORDER — GADOBENATE DIMEGLUMINE 529 MG/ML IV SOLN
10.0000 mL | Freq: Once | INTRAVENOUS | Status: AC | PRN
  Administered 2014-03-11: 10 mL via INTRAVENOUS

## 2014-03-11 NOTE — Progress Notes (Signed)
ID: [name]   DOB: 1970/05/22  MR#: [id number]  CSN#:[id number]   PCP: Chauncey Cruel, MD GYN: Baltazar Najjar MD SU: Fanny Skates MD OTHER MD: Theodoro Kos, [name], [name]  CHIEF COMPLAINT:  Recurrent Breast Cancer, Right CURRENT THERAPY: To start anastrozole   BREAST CANCER HISTORY:  From the original intake note:  "Judith Vaquera (goes by "[name]") is a [place] woman who, at the age of 43, was referred by Dr. Baltazar Najjar for treatment of right breast carcinoma.   1.  The patient palpated a mass in her right breast which she brought to Dr. Jacelyn Grip attention. A prior screening mammogram on 02/06/2010 had been unremarkable. The patient was then scheduled for bilateral diagnostic mammogram and right breast ultrasonography on 12/22/2010, the studies demonstrating an ill defined mass with heterogeneous calcifications in the inner lower right breast, measuring 1.7 cm. The breasts are heterogeneously dense, but there were no other mammographic abnormalities in either breast. Clinically, there was a palpable firm mass at the 5:00 position of the right breast, 5 cm from the nipple. There were no palpable abnormalities in the right axilla. Ultrasound confirmed a 1.7 cm irregular, hypoechoic mass in the same position. There were some lymph nodes noted in the right exam of, one measuring 1.6 cm with a minimally thickened cortex inferiorly.  An ultrasound-guided core biopsy on 12/23/2010 [phone]) showed an invasive ductal carcinoma, grade 2, ER positive at 88%, PR positive at 11 is %, HER-2/neu positive with a FISH ratio of 4.61, and an MIB-1 of 36%.  The patient was referred to Dr. Fanny Skates and bilateral breast MRIs were obtained 12/30/2010. This showed the mass in question in the right breast to measure 2.0 cm. In the right axilla there was a 1.8 cm lymph node with a thickened cortex. The left breast was unremarkable.  Her subsequent treatment is as  detailed below.  2.   Myrtis then had a restaging PET scan late December 2014 which was positive only for a small subcutaneous nodule in the right breast lower outer quadrant. There was no other finding of concern. Ultrasound of both breasts 04/08/2013 showed a nodule in the right breast at the 4:00 position measuring 0.7 cm. The right axilla was clear. There was an area at the 7:00 position of the left breast which was of concern by palpation but showed only normal tissue.   Biopsy of the right breast mass 04/09/2013 showed an invasive ductal carcinoma, grade 2, estrogen receptor 94% positive, progesterone receptor 11% positive, with an MIB-1 of 19% and HER-2 amplified with a signals ratio of 3.75 and the number per cell of 4.50. The patient discussed the situation with with her surgeon's and on 05/05/2013 proceeded to right lumpectomy, with the final pathology (S. is a [id number]) confirming an invasive ductal carcinoma, 0.7 cm, grade 2, within a millimeter of the posterior margin, which showed skeletal muscle. A port was placed at the time of that procedure."  Her subsequent treatment is as detailed below.  INTERVAL HISTORY:  [name] returns today for followup of her recurrent right breast cancer. After her last visit here she could barely drive home because of the back pain. She waited in the car before starting but as the pain did not get any better she went ahead and wrote part of the way then stopped. She did take the steroids as prescribed. They did not seem to do her any good whatsoever. The cost minimal ankle swelling. She did not  develop thrush or vaginal yeast infections. She did have some urinary tract infection-like symptoms but her urinary culture here was negative.   REVIEW OF SYSTEMS:  Aside from the back pain, a detailed review of systems today was entirely stable.  PAST MEDICAL HISTORY: Past Medical History  Diagnosis Date  . Migraines   . Asthma     as a child  . PONV  (postoperative nausea and vomiting)   . Rash     neck  . Anxiety   . Pneumonia   . Kidney stones   . GERD (gastroesophageal reflux disease)     had during chemo  . Cervical dysplasia   . Breast cancer Sept 2012    right breast, inner lower right  . Hx of radiation therapy 11/24/13- 01/08/14    reconstructed right breast/chest wall 4500 cGy 25 sessions, electron beam boost 1440 cGy 8 sessions  1. History of prior adenoidectomy. 2. History of rhinoplasty.   3. History of multiple uterine cervical treatments for precancerous lesions, none for the past 10 years or so. 4. History of fibroid tumor surgery. 5. History of D and C. 6. History of dysplastic nevi removed by Dr. Ronnald Ramp. 7. History of right "lazy eye" corrective surgery. 8. History of approximately 10-pack-years of tobacco abuse, resolved.  9. History of possible migraines (morning headaches). History of asthma as a child.  PAST SURGICAL HISTORY: Past Surgical History  Procedure Laterality Date  . Rhinoplasty    . Adenoidectomy    . Fibroid tumor surgery    . Dilation and curettage of uterus    . Uterine cervical treatments  2002    for precancerous lesions  . Lazy eye      corrective surgery  . Mastectomy w/ sentinel node biopsy  06/29/2011    Procedure: MASTECTOMY WITH SENTINEL LYMPH NODE BIOPSY;  Surgeon: Adin Hector, MD;  Location: [facility];  Service: General;  Laterality: Right;  right skin spairing mstectomy and right sentinel lymph node biopsy  . Breast reconstruction  06/29/2011    Procedure: BREAST RECONSTRUCTION;  Surgeon: Theodoro Kos, DO;  Location: [facility];  Service: Plastics;  Laterality: Bilateral;  Immediate Bilateral Breast Reconstruction with Bilateral Tissue Expanders and placement of  Alloderm   . Port-a-cath removal  03/12/2012    Procedure: MINOR REMOVAL PORT-A-CATH;  Surgeon: Adin Hector, MD;  Location: [facility];  Service: General;  Laterality: N/A;  Upper left  . Breast lumpectomy  with needle localization Right 05/05/2013    Procedure: EXCISION RECURRENT CANCER RIGHT BREAST WITH NEEDLE LOCALOZATION;  Surgeon: Adin Hector, MD;  Location: [facility];  Service: General;  Laterality: Right;  . Portacath placement Left 05/05/2013    Procedure: INSERTION PORT-A-CATH;  Surgeon: Adin Hector, MD;  Location: [facility];  Service: General;  Laterality: Left;    FAMILY HISTORY Family History  Problem Relation Age of Onset  . Adopted: Yes  . Heart attack Father   . Cancer Cousin 40    melanoma; mat first cousin, located on neck  . Cancer Maternal Aunt 60    stomach and ovarian as separate primaries  . Cancer Maternal Uncle 65    lung; smoker  . Cancer Maternal Grandmother 76    enodometrial  . Cancer Maternal Aunt 55    breast  . Cancer Maternal Uncle 63    prostate  :  The patient's biological father died at the age of 1 from myocardial infarction.  The patient's biological mother is  alive at age 12.  The patient was adopted by her biological mother's parents.  She has 3 half-brothers.  No sisters.  One cousin on her mother's side died from melanoma at the age of 80.  There is other cancer history in the adopted side but these are not even half-brothers or sisters, they are children of her adopted parents who are really her grandparents so these would be uncles and aunts (1 lymph node cancer, 1 prostate cancer, 1 breast cancer at the age of 59).  GYNECOLOGIC HISTORY:   (Updated 05/22/2013) She had menarche age 38 or 59.  She is GX, P1, first pregnancy to term at age 33. She stopped having periods approximately March 2013.  Hormone levels in February 2015 show patient to be premenopausal, with an estradiol of 113 on 05/15/2013, and her periods indeed resumed February of 2015  SOCIAL HISTORY: (Updated 06/12/2013) She works in a Engineer, agricultural.  Her husband, [name] owns a family business called [employer].  They have been married 5 years.  They have a son,  [name], age 57. [name] also has a daughter, [name], who lives in [place] and is 44 years old.  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  The patient is not a church attender.    ADVANCED DIRECTIVES: Not in place  HEALTH MAINTENANCE:  (Updated 06/12/2013) History  Substance Use Topics  . Smoking status: Former Smoker    Quit date: 07/20/1996  . Smokeless tobacco: Never Used  . Alcohol Use: No     Colonoscopy:  Never  PAP: Oct 2014/[facility]  Bone density: Oct 2014, mild osteopenia with a T score of -1.1  Lipid panel: Not on file  Allergies  Allergen Reactions  . Tussionex Pennkinetic Er [Hydrocod Polst-Cpm Polst Er] Other (See Comments)    hallucinations  . Codeine Other (See Comments)    unkown  . Morphine Rash  . Morphine And Related Anxiety    anxiety  . Penicillin G Rash  . Penicillins Rash    Current Outpatient Prescriptions  Medication Sig Dispense Refill  . anastrozole (ARIMIDEX) 1 MG tablet Take 1 tablet (1 mg total) by mouth daily. 90 tablet 4  . aspirin-acetaminophen-caffeine (EXCEDRIN MIGRAINE) [id number] MG per tablet Take 2 tablets by mouth every 6 (six) hours as needed for headache.    . Biotin 5000 MCG CAPS Take 5,000 mcg by mouth daily.    . chlorpheniramine (ALLERGY) 4 MG tablet Take by mouth.    . dexamethasone (DECADRON) 4 MG tablet Take 2 tablets (8 mg total) by mouth 2 (two) times daily with a meal. 24 tablet 3  . doxycycline (VIBRA-TABS) 100 MG tablet Take 1 tablet (100 mg total) by mouth 2 (two) times daily. 30 tablet 6  . hydrOXYzine (ATARAX/VISTARIL) 10 MG tablet Take 1 tablet (10 mg total) by mouth 3 (three) times daily as needed. 30 tablet 3  . ibuprofen (ADVIL,MOTRIN) 800 MG tablet Take 1 tablet (800 mg total) by mouth every 8 (eight) hours as needed. 60 tablet 5  . LORazepam (ATIVAN) 0.5 MG tablet Take 1 tablet (0.5 mg total) by mouth 2 (two) times daily as needed for anxiety. 60 tablet 0  . Multiple Vitamin (MULTIVITAMIN) tablet Take 1 tablet  by mouth daily.    . Multiple Vitamins-Minerals (HAIR/SKIN/NAILS PO) Take 1 tablet by mouth daily.     . prenatal vitamin w/FE, FA (PRENATAL 1 + 1) 27-1 MG TABS Take 1 tablet by mouth Daily.    [name]  prochlorperazine (COMPAZINE) 10 MG tablet Take 1 tablet (10 mg total) by mouth every 6 (six) hours as needed for nausea or vomiting. 30 tablet 0  . traMADol (ULTRAM) 50 MG tablet TAKE ONE TABLET BY MOUTH EVERY 6 HOURS AS NEEDED FOR MODERATE PAIN 30 tablet 0   No current facility-administered medications for this visit.    OBJECTIVE: Middle-aged white woman who appears well Filed Vitals:   03/11/14 1724  BP: 110/68  Pulse: 77     There is no weight on file to calculate BMI.    ECOG FS: 1 There were no vitals filed for this visit.  Oropharynx clear No cervical or supraclavicular adenopathy Lungs no rales or rhonchi Heart regular rate and rhythm Abd soft, positive bowel sounds; nontender MSK no focal spinal tenderness to vigorous percussion and palpation Neuro: nonfocal, well oriented, appropriate affect  Breasts: Deferred  LAB RESULTS: Lab Results  Component Value Date   WBC 5.3 02/04/2014   NEUTROABS 3.6 02/04/2014   HGB 13.7 02/04/2014   HCT 40.3 02/04/2014   MCV 90.6 02/04/2014   PLT 212 02/04/2014      Chemistry      Component Value Date/Time   NA 141 02/04/2014 1535   NA 140 01/08/2014 1459   K 3.9 02/04/2014 1535   K 4.5 01/08/2014 1459   CL 103 01/08/2014 1459   CL 104 09/23/2012 1415   CO2 27 02/04/2014 1535   CO2 29 01/08/2014 1459   BUN 11.1 02/04/2014 1535   BUN 12 01/08/2014 1459   CREATININE 0.8 02/04/2014 1535   CREATININE 0.76 01/08/2014 1459   CREATININE 0.70 05/05/2013 1059      Component Value Date/Time   CALCIUM 10.1 02/04/2014 1535   CALCIUM 9.8 01/08/2014 1459   ALKPHOS 109 02/04/2014 1535   ALKPHOS 87 01/08/2014 1459   AST 30 02/04/2014 1535   AST 33 01/08/2014 1459   ALT 22 02/04/2014 1535   ALT 21 01/08/2014 1459   BILITOT 0.65 02/04/2014  1535   BILITOT 0.5 01/08/2014 1459     Results for Judith, [name] (MRN [id number]) as of 02/25/2014 07:48  Ref. Range 03/25/2012 14:59 05/15/2013 11:25 12/08/2013 15:29 02/04/2014 15:35  FSH No range found 22.9 36.1 122.7 (H) 127.9 (H)   Results for Judith, [name] (MRN [id number]) as of 02/25/2014 07:48  Ref. Range 03/25/2012 14:59 05/15/2013 11:25 12/08/2013 15:29 02/04/2014 15:36  Estradiol, Ultra Sensitive No range found 823 113 19 20   STUDIES: Mr Lumbar Spine W Wo Contrast  03/11/2014   CLINICAL DATA:  Low back pain and bilateral leg pain with numbness and tingling. History of breast cancer.  EXAM: MRI LUMBAR SPINE WITHOUT AND WITH CONTRAST  TECHNIQUE: Multiplanar and multiecho pulse sequences of the lumbar spine were obtained without and with intravenous contrast.  CONTRAST:  62mL MULTIHANCE GADOBENATE DIMEGLUMINE 529 MG/ML IV SOLN  COMPARISON:  PET CT scans dated 04/07/2013 and 01/12/2011  FINDINGS: Normal conus tip at L1.  Normal paraspinal soft tissues.  There is a benign 2 cm hemangioma in the left side of the L4 vertebra. This is not significant.  The discs from T11-12 through L5-S1 are normal. There is no foraminal or spinal stenosis. There is no facet arthritis.  There is no pathologic enhancement after contrast administration. The hemangioma does enhance as expected.  IMPRESSION: Essentially normal MRI of the lumbar spine. Benign hemangioma in the L4 vertebra.   Electronically Signed   By: Rozetta Nunnery M.D.   On: 03/11/2014 16:58  Mr T Spine Ltd W/o Cm  03/11/2014   CLINICAL DATA:  History of breast cancer, mid and low back pain with bilateral leg and numbness  EXAM: MRI THORACIC SPINE WITHOUT CONTRAST  TECHNIQUE: Multiplanar, multisequence MR imaging of the thoracic spine was performed. No intravenous contrast was administered.  COMPARISON:  PET-CT 04/07/2013  FINDINGS: The vertebral body heights are maintained. The alignment is anatomic. There is no marrow signal abnormality. There is  no acute fracture or static listhesis. The thoracic spinal cord is normal in size and signal. The disc spaces are preserved. There is no disc protrusion, foraminal stenosis or central canal stenosis.  There is mild degenerative disc disease at C6-7 which is incompletely visualized. There appears to be a broad-based disc bulge at C6-7.  IMPRESSION: Normal MRI of the thoracic spine.   Electronically Signed   By: Kathreen Devoid   On: 03/11/2014 16:18    ASSESSMENT: 43 y.o.  BRCA 1-2 negative [name], [place] woman,   (1) status post right breast biopsy in September 2012 for a clinically 2.0 cm invasive ductal carcinoma, with some enlarged Right axillary lymph nodes, the largest one of which was negative by biopsy. The tumor was grade 2, estrogen receptor positive at 88%, progesterone receptor positive at 11%, HER-2/neu positive by CISH with a ratio of 4.61, and a proliferation marker of 36%.   (2) treated in the neoadjuvant setting with Q 3 week docetaxel/ carboplatin/ trastuzumab x6, completed 05/18/2011.  Continued on trastuzumab every 3 weeks to complete one year, last dose in November 2013.  (3) s/p bilateral mastectomies with sentinel node biopsy 06/29/2011 for a residual right-sided ypT1c ypN0 invasive ductal carcinoma, grade 2, with immediate expander placement  (4) on tamoxifen as of May 2013, discontinued in early 2015 due to breast cancer recurrence   (5) status post left lumpectomy with left-sided sentinel lymph node sampling 05/05/2013 for a pT1b pN0, stage IA invasive ductal carcinoma, estrogen receptor 94% positive, progesterone receptor 11% positive, with an MIB-1 of 19% and HER-2 amplification  (6)  Received TDM-1 every 3 weeks x 8 doses completed 10/09/2013; echo 08/25/2013 showed a well preserved ejection fraction  (7) adjuvant radiation therapy completed 01/08/2014  (8) to start anastrozole 04/10/2014   (a) Grand and estradiol in menopausal range 02/04/2014, to be followed every 3  months  (b) bone density 01/15/2013 showed a T score of -1.1  (9) genetic testing (23 genes associated with hereditary ovarian cancer through [facility], April 2015) 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   PLAN:  [name] thoracic and lumbar MRIs were completely normal. I think the pain she is experiencing, which is incidentally is improving, is going to be do some kind of muscle spasm. I suggested she consider acupuncture. The problem is that I do not know on a every puncture wrist to refer her to. She will have to do a little research there. Something else she can try is to have [name] her husband put some sustained pressure over the spot where she is hurting. If this is a spasm and should help relax.  We again reviewed menopausal symptoms and a possible side effects toxicities and complications of anastrozole. I really would prefer she not start the medication until the first week in January, because I think she would get it can fuse with the discomfort in her back. In addition she has a PET scan scheduled for the end of the month. I think once we  have that behind as we can get started on anastrozole. She will call for the PET result today after the test.  Seven will see me again in March. She knows to call for any problems that may develop before the next visit here.  Chauncey Cruel, MD     03/11/2014

## 2014-04-06 ENCOUNTER — Encounter: Payer: Self-pay | Admitting: *Deleted

## 2014-04-07 ENCOUNTER — Ambulatory Visit (HOSPITAL_COMMUNITY)
Admission: RE | Admit: 2014-04-07 | Discharge: 2014-04-07 | Disposition: A | Discharge status: Home / Self Care | Payer: BC Managed Care – PPO | Source: Ambulatory Visit | Attending: Oncology | Admitting: Oncology

## 2014-04-07 ENCOUNTER — Encounter: Payer: Self-pay | Admitting: Obstetrics & Gynecology

## 2014-04-07 DIAGNOSIS — Z9013 Acquired absence of bilateral breasts and nipples: Secondary | ICD-10-CM | POA: Diagnosis not present

## 2014-04-07 DIAGNOSIS — C50319 Malignant neoplasm of lower-inner quadrant of unspecified female breast: Secondary | ICD-10-CM

## 2014-04-07 DIAGNOSIS — C50911 Malignant neoplasm of unspecified site of right female breast: Secondary | ICD-10-CM

## 2014-04-07 DIAGNOSIS — C50311 Malignant neoplasm of lower-inner quadrant of right female breast: Secondary | ICD-10-CM

## 2014-04-07 LAB — GLUCOSE, CAPILLARY: Glucose-Capillary: 97 mg/dL (ref 70–99)

## 2014-04-07 MED ORDER — FLUDEOXYGLUCOSE F - 18 (FDG) INJECTION
8.0000 | Freq: Once | INTRAVENOUS | Status: AC | PRN
  Administered 2014-04-07: 8 via INTRAVENOUS

## 2014-04-08 ENCOUNTER — Telehealth: Payer: Self-pay | Admitting: Oncology

## 2014-04-08 ENCOUNTER — Other Ambulatory Visit: Payer: Self-pay | Admitting: *Deleted

## 2014-04-08 NOTE — Telephone Encounter (Signed)
per pof ot sch pt appt-sch per Val in March-mailed copy of sch

## 2014-05-13 ENCOUNTER — Other Ambulatory Visit: Payer: Self-pay | Admitting: Oncology

## 2014-05-14 ENCOUNTER — Other Ambulatory Visit: Payer: Self-pay | Admitting: Emergency Medicine

## 2014-05-14 ENCOUNTER — Telehealth: Payer: Self-pay | Admitting: Emergency Medicine

## 2014-05-14 DIAGNOSIS — C50311 Malignant neoplasm of lower-inner quadrant of right female breast: Secondary | ICD-10-CM

## 2014-05-14 MED ORDER — TRAMADOL HCL 50 MG PO TABS: ORAL_TABLET | ORAL | Status: DC

## 2014-05-14 NOTE — Telephone Encounter (Signed)
Patient called with concerns about whether or not she is in menopause or not. Patient states she is experiencing hot flashes and hair thinning. Due to these concerns she has not started the Arimidex yet. Per last office visit she was instructed to start the Arimidex in the beginning of January. Patient is due to see Dr Jana Hakim for an office visit on 3/29. Offered patient to come earlier for labs; she would like office visit moved earlier as well. Instructed patient that Dr Jana Hakim has no availability prior to 3/29.

## 2014-05-20 ENCOUNTER — Telehealth: Payer: Self-pay | Admitting: Oncology

## 2014-05-20 ENCOUNTER — Other Ambulatory Visit: Payer: Self-pay | Admitting: *Deleted

## 2014-05-20 NOTE — Telephone Encounter (Signed)
pt cld & left vm wanted to know time & date for appt-tlkwd w/Val because a lab was sch earlier thatn 1 wk b4 appt-Va; stated she will call pt herself

## 2014-05-21 ENCOUNTER — Telehealth: Payer: Self-pay | Admitting: *Deleted

## 2014-05-21 NOTE — Telephone Encounter (Signed)
This RN spoke with pt today post review of pt's call stating she had not started on anastrazole due to " I think I am going thru menopause."  Per discussion yesterday - [name] stated concerns about " not having any estrogen in my body- doesn't a woman need a little estrogen "  This RN discussed with pt benefit of estrogen in post menopause women but due to her known history of breast cancer that likes to use estrogen to grow - for best outcome she needs to be totally estrogen depleted.  Pt very concerned about above and " so do you think it is ok to wait until I see Dr Jana Hakim and have the lab results to start the medication?"  The above reviewed with MD and this RN informed pt of his recommendation to initiate AI now.  [name] stated " won't that make me old ? "  This RN validated pt's concern related to above and  reiterated goal of therapy with pt's known history.  [name] inquired about vitamins, supplements or other interventions to counter out possible " becoming old " relating to not having estrogen in her body.  This RN informed pt above concerns will be addressed visit - at present best to start the anastrazole for benefit and tolerance .  [name] verbalized understanding.

## 2014-06-18 ENCOUNTER — Other Ambulatory Visit (HOSPITAL_BASED_OUTPATIENT_CLINIC_OR_DEPARTMENT_OTHER): Payer: BLUE CROSS/BLUE SHIELD

## 2014-06-18 DIAGNOSIS — C50911 Malignant neoplasm of unspecified site of right female breast: Secondary | ICD-10-CM

## 2014-06-18 DIAGNOSIS — C50311 Malignant neoplasm of lower-inner quadrant of right female breast: Secondary | ICD-10-CM

## 2014-06-18 DIAGNOSIS — Z9013 Acquired absence of bilateral breasts and nipples: Secondary | ICD-10-CM

## 2014-06-18 LAB — CBC WITH DIFFERENTIAL/PLATELET
BASO%: 0.2 % (ref 0.0–2.0)
Basophils Absolute: 0 10*3/uL (ref 0.0–0.1)
EOS%: 0.9 % (ref 0.0–7.0)
Eosinophils Absolute: 0.1 10*3/uL (ref 0.0–0.5)
HCT: 41.5 % (ref 34.8–46.6)
HGB: 14 g/dL (ref 11.6–15.9)
LYMPH%: 25.4 % (ref 14.0–49.7)
MCH: 31.2 pg (ref 25.1–34.0)
MCHC: 33.7 g/dL (ref 31.5–36.0)
MCV: 92.4 fL (ref 79.5–101.0)
MONO#: 0.4 10*3/uL (ref 0.1–0.9)
MONO%: 6.2 % (ref 0.0–14.0)
NEUT#: 3.9 10*3/uL (ref 1.5–6.5)
NEUT%: 67.3 % (ref 38.4–76.8)
Platelets: 210 10*3/uL (ref 145–400)
RBC: 4.49 10*6/uL (ref 3.70–5.45)
RDW: 12.5 % (ref 11.2–14.5)
WBC: 5.8 10*3/uL (ref 3.9–10.3)
lymph#: 1.5 10*3/uL (ref 0.9–3.3)

## 2014-06-18 LAB — COMPREHENSIVE METABOLIC PANEL (CC13)
ALT: 23 U/L (ref 0–55)
AST: 28 U/L (ref 5–34)
Albumin: 4.6 g/dL (ref 3.5–5.0)
Alkaline Phosphatase: 94 U/L (ref 40–150)
Anion Gap: 13 mEq/L — ABNORMAL HIGH (ref 3–11)
BUN: 11.6 mg/dL (ref 7.0–26.0)
CO2: 27 mEq/L (ref 22–29)
Calcium: 10.3 mg/dL (ref 8.4–10.4)
Chloride: 103 mEq/L (ref 98–109)
Creatinine: 0.8 mg/dL (ref 0.6–1.1)
EGFR: 85 mL/min/{1.73_m2} — ABNORMAL LOW (ref >90)
Glucose: 88 mg/dl (ref 70–140)
Potassium: 4 mEq/L (ref 3.5–5.1)
Sodium: 143 mEq/L (ref 136–145)
Total Bilirubin: 0.9 mg/dL (ref 0.20–1.20)
Total Protein: 7.3 g/dL (ref 6.4–8.3)

## 2014-06-19 LAB — FOLLICLE STIMULATING HORMONE: FSH: 153.5 m[IU]/mL — ABNORMAL HIGH

## 2014-06-23 LAB — ESTRADIOL, ULTRA SENS: Estradiol, Ultra Sensitive: <2 pg/mL

## 2014-06-29 ENCOUNTER — Other Ambulatory Visit: Payer: Self-pay | Admitting: *Deleted

## 2014-06-29 MED ORDER — PRENATAL PLUS 27-1 MG PO TABS: 1.0000 | ORAL_TABLET | Freq: Every day | ORAL | Status: DC

## 2014-06-29 NOTE — Progress Notes (Signed)
Patients pharmacy sent a refill request for Preplus 27-1MG  Tab. Oty:90. Rx sent to pharmacy.

## 2014-07-07 ENCOUNTER — Telehealth: Payer: Self-pay | Admitting: Oncology

## 2014-07-07 ENCOUNTER — Other Ambulatory Visit: Payer: BC Managed Care – PPO

## 2014-07-07 ENCOUNTER — Ambulatory Visit (HOSPITAL_BASED_OUTPATIENT_CLINIC_OR_DEPARTMENT_OTHER): Payer: BLUE CROSS/BLUE SHIELD | Admitting: Oncology

## 2014-07-07 VITALS — BP 108/61 | HR 66 | Temp 98.4°F | Resp 18 | Ht 63.0 in | Wt 124.1 lb

## 2014-07-07 DIAGNOSIS — C50912 Malignant neoplasm of unspecified site of left female breast: Secondary | ICD-10-CM | POA: Diagnosis not present

## 2014-07-07 DIAGNOSIS — Z170 Estrogen receptor positive status [ER+]: Secondary | ICD-10-CM | POA: Diagnosis not present

## 2014-07-07 DIAGNOSIS — C50919 Malignant neoplasm of unspecified site of unspecified female breast: Secondary | ICD-10-CM

## 2014-07-07 MED ORDER — LETROZOLE 2.5 MG PO TABS: 2.5000 mg | ORAL_TABLET | Freq: Every day | ORAL | Status: DC

## 2014-07-07 NOTE — Telephone Encounter (Signed)
per pof to sch pt appt-cld pt and gave pt time & date of appt-pt req to have copy mailed-mailed

## 2014-07-07 NOTE — Progress Notes (Signed)
ID: [name]   DOB: May 14, 1970  MR#: [id number]  HYQ#:[id number]   PCP: Chauncey Cruel, MD GYN: Baltazar Najjar MD SU: Fanny Skates MD OTHER MD: Theodoro Kos, [name], [name]  CHIEF COMPLAINT:  Recurrent Breast Cancer, Right CURRENT THERAPY: on aromatase inhibotirs   BREAST CANCER HISTORY:  From the original intake note:  "[name] (goes by "[name]") is a [place] woman who, at the age of 44, was referred by Dr. Baltazar Najjar for treatment of right breast carcinoma.   1.  The patient palpated a mass in her right breast which she brought to Dr. Jacelyn [name] attention. A prior screening mammogram on 02/06/2010 had been unremarkable. The patient was then scheduled for bilateral diagnostic mammogram and right breast ultrasonography on 12/22/2010, the studies demonstrating an ill defined mass with heterogeneous calcifications in the inner lower right breast, measuring 1.7 cm. The breasts are heterogeneously dense, but there were no other mammographic abnormalities in either breast. Clinically, there was a palpable firm mass at the 5:00 position of the right breast, 5 cm from the nipple. There were no palpable abnormalities in the right axilla. Ultrasound confirmed a 1.7 cm irregular, hypoechoic mass in the same position. There were some lymph nodes noted in the right exam of, one measuring 1.6 cm with a minimally thickened cortex inferiorly.  An ultrasound-guided core biopsy on 12/23/2010 [phone]) showed an invasive ductal carcinoma, grade 2, ER positive at 88%, PR positive at 11 is %, HER-2/neu positive with a FISH ratio of 4.61, and an MIB-1 of 36%.  The patient was referred to Dr. Fanny Skates and bilateral breast MRIs were obtained 12/30/2010. This showed the mass in question in the right breast to measure 2.0 cm. In the right axilla there was a 1.8 cm lymph node with a thickened cortex. The left breast was unremarkable.  Her subsequent treatment is as  detailed below.  2.   [name] then had a restaging PET scan late December 2014 which was positive only for a small subcutaneous nodule in the right breast lower outer quadrant. There was no other finding of concern. Ultrasound of both breasts 04/08/2013 showed a nodule in the right breast at the 4:00 position measuring 0.7 cm. The right axilla was clear. There was an area at the 7:00 position of the left breast which was of concern by palpation but showed only normal tissue.   Biopsy of the right breast mass 04/09/2013 showed an invasive ductal carcinoma, grade 2, estrogen receptor 94% positive, progesterone receptor 11% positive, with an MIB-1 of 19% and HER-2 amplified with a signals ratio of 3.75 and the number per cell of 4.50. The patient discussed the situation with with her surgeon's and on 05/05/2013 proceeded to right lumpectomy, with the final pathology (S. is a [id number]) confirming an invasive ductal carcinoma, 0.7 cm, grade 2, within a millimeter of the posterior margin, which showed skeletal muscle. A port was placed at the time of that procedure."  Her subsequent treatment is as detailed below.  INTERVAL HISTORY:  [name] returns today for followup of her recurrent right breast cancer. She has been on anastrozole now for several months. She is getting it at a good price. She is having back pain, headaches, insomnia, and hot flashes. She attributes all these to the anastrozole. She also had some visual changes and has needed corrective lenses for this. She would like to switch to Femara (letrozole).  REVIEW OF SYSTEMS:  She has pain in her lower back,  which is more like a tightness that sometimes spreads. She has been reading her MRI reports and wonders if the hemangioma she has there could be the cause. She sleeps poorly. She has a runny nose and other signs of mild allergies, but she has stopped taking her Atarax. She tells me her appetite is poor. She has had some hemorrhoidal bleeding.  She has headaches which are mostly on the back of the neck and associated with stress, but she does have a disc bulge in that area on her MRI and she wonders if that is part of the problem. Sometimes she has a dull headache on the side of the head bilaterally. She feels forgetful and anxious but not depressed. She still has significant problems with hot flashes. A detailed review of systems was otherwise stable.  PAST MEDICAL HISTORY: Past Medical History  Diagnosis Date  . Migraines   . Asthma     as a child  . PONV (postoperative nausea and vomiting)   . Rash     neck  . Anxiety   . Pneumonia   . Kidney stones   . GERD (gastroesophageal reflux disease)     had during chemo  . Cervical dysplasia   . Breast cancer Sept 2012    right breast, inner lower right  . Hx of radiation therapy 11/24/13- 01/08/14    reconstructed right breast/chest wall 4500 cGy 25 sessions, electron beam boost 1440 cGy 8 sessions  1. History of prior adenoidectomy. 2. History of rhinoplasty.   3. History of multiple uterine cervical treatments for precancerous lesions, none for the past 10 years or so. 4. History of fibroid tumor surgery. 5. History of D and C. 6. History of dysplastic nevi removed by Dr. Ronnald Ramp. 7. History of right "lazy eye" corrective surgery. 8. History of approximately 10-pack-years of tobacco abuse, resolved.  9. History of possible migraines (morning headaches). History of asthma as a child.  PAST SURGICAL HISTORY: Past Surgical History  Procedure Laterality Date  . Rhinoplasty    . Adenoidectomy    . Fibroid tumor surgery    . Dilation and curettage of uterus    . Uterine cervical treatments  2002    for precancerous lesions  . Lazy eye      corrective surgery  . Mastectomy w/ sentinel node biopsy  06/29/2011    Procedure: MASTECTOMY WITH SENTINEL LYMPH NODE BIOPSY;  Surgeon: Adin Hector, MD;  Location: [facility];  Service: General;  Laterality: Right;  right skin spairing  mstectomy and right sentinel lymph node biopsy  . Breast reconstruction  06/29/2011    Procedure: BREAST RECONSTRUCTION;  Surgeon: Theodoro Kos, DO;  Location: [facility];  Service: Plastics;  Laterality: Bilateral;  Immediate Bilateral Breast Reconstruction with Bilateral Tissue Expanders and placement of  Alloderm   . Port-a-cath removal  03/12/2012    Procedure: MINOR REMOVAL PORT-A-CATH;  Surgeon: Adin Hector, MD;  Location: [facility];  Service: General;  Laterality: N/A;  Upper left  . Breast lumpectomy with needle localization Right 05/05/2013    Procedure: EXCISION RECURRENT CANCER RIGHT BREAST WITH NEEDLE LOCALOZATION;  Surgeon: Adin Hector, MD;  Location: [facility];  Service: General;  Laterality: Right;  . Portacath placement Left 05/05/2013    Procedure: INSERTION PORT-A-CATH;  Surgeon: Adin Hector, MD;  Location: [facility];  Service: General;  Laterality: Left;    FAMILY HISTORY Family History  Problem Relation Age of Onset  . Adopted: Yes  .  Heart attack Father   . Cancer Cousin 40    melanoma; mat first cousin, located on neck  . Cancer Maternal Aunt 60    stomach and ovarian as separate primaries  . Cancer Maternal Uncle 65    lung; smoker  . Cancer Maternal Grandmother 57    enodometrial  . Cancer Maternal Aunt 55    breast  . Cancer Maternal Uncle 63    prostate  :  The patient's biological father died at the age of 21 from myocardial infarction.  The patient's biological mother is alive at age 23.  The patient was adopted by her biological mother's parents.  She has 3 half-brothers.  No sisters.  One cousin on her mother's side died from melanoma at the age of 27.  There is other cancer history in the adopted side but these are not even half-brothers or sisters, they are children of her adopted parents who are really her grandparents so these would be uncles and aunts (1 lymph node cancer, 1 prostate cancer, 1 breast cancer at the age of  26).  GYNECOLOGIC HISTORY:   (Updated 05/22/2013) She had menarche age 1 or 59.  She is GX, P1, first pregnancy to term at age 39. She stopped having periods approximately March 2013.  Hormone levels in February 2015 show patient to be premenopausal, with an estradiol of 113 on 05/15/2013, and her periods indeed resumed February of 2015  SOCIAL HISTORY: (Updated 06/12/2013) She works in a Engineer, agricultural.  Her husband, [name] owns a family business called [employer].  They have been married 5 years.  They have a son, [name], age 78. [name] also has a daughter, [name], who lives in [place] and is 45 years old.  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  The patient is not a church attender.    ADVANCED DIRECTIVES: Not in place  HEALTH MAINTENANCE:  (Updated 06/12/2013) History  Substance Use Topics  . Smoking status: Former Smoker    Quit date: 07/20/1996  . Smokeless tobacco: Never Used  . Alcohol Use: No     Colonoscopy:  Never  PAP: Oct 2014/[facility]  Bone density: Oct 2014, mild osteopenia with a T score of -1.1  Lipid panel: Not on file  Allergies  Allergen Reactions  . Tussionex Pennkinetic Er [Hydrocod Polst-Cpm Polst Er] Other (See Comments)    hallucinations  . Codeine Other (See Comments)    unkown  . Morphine Rash  . Morphine And Related Anxiety    anxiety  . Penicillin G Rash  . Penicillins Rash    Current Outpatient Prescriptions  Medication Sig Dispense Refill  . anastrozole (ARIMIDEX) 1 MG tablet Take 1 tablet (1 mg total) by mouth daily. 90 tablet 4  . aspirin-acetaminophen-caffeine (EXCEDRIN MIGRAINE) 376-283-15 MG per tablet Take 2 tablets by mouth every 6 (six) hours as needed for headache.    . Biotin 5000 MCG CAPS Take 5,000 mcg by mouth daily.    . chlorpheniramine (ALLERGY) 4 MG tablet Take by mouth.    . dexamethasone (DECADRON) 4 MG tablet Take 2 tablets (8 mg total) by mouth 2 (two) times daily with a meal. 24 tablet 3  .  doxycycline (VIBRA-TABS) 100 MG tablet Take 1 tablet (100 mg total) by mouth 2 (two) times daily. 30 tablet 6  . hydrOXYzine (ATARAX/VISTARIL) 10 MG tablet Take 1 tablet (10 mg total) by mouth 3 (three) times daily as needed. 30 tablet 3  . ibuprofen (ADVIL,MOTRIN) 800  MG tablet Take 1 tablet (800 mg total) by mouth every 8 (eight) hours as needed. 60 tablet 5  . LORazepam (ATIVAN) 0.5 MG tablet Take 1 tablet (0.5 mg total) by mouth 2 (two) times daily as needed for anxiety. 60 tablet 0  . Multiple Vitamin (MULTIVITAMIN) tablet Take 1 tablet by mouth daily.    . Multiple Vitamins-Minerals (HAIR/SKIN/NAILS PO) Take 1 tablet by mouth daily.     . prenatal vitamin w/FE, FA (PRENATAL 1 + 1) 27-1 MG TABS tablet Take 1 tablet by mouth daily. 90 each 4  . prochlorperazine (COMPAZINE) 10 MG tablet Take 1 tablet (10 mg total) by mouth every 6 (six) hours as needed for nausea or vomiting. 30 tablet 0  . traMADol (ULTRAM) 50 MG tablet TAKE ONE TABLET BY MOUTH EVERY 6 HOURS AS NEEDED FOR MODERATE PAIN 30 tablet 0   No current facility-administered medications for this visit.    OBJECTIVE: Middle-aged white woman in no acute distress Filed Vitals:   07/07/14 1159  BP: 108/61  Pulse: 66  Temp: 98.4 F (36.9 C)  Resp: 18     Body mass index is 21.99 kg/(m^2).    ECOG FS: 1 Filed Weights   07/07/14 1159  Weight: 124 lb 1.6 oz (56.291 kg)    Oropharynx clear, dentition in good repair No cervical or supraclavicular adenopathy Lungs no rales or rhonchi Heart regular rate and rhythm Abd soft, positive bowel sounds; nontender MSK no focal spinal tenderness including percussion of the lower spine Neuro: nonfocal, well oriented, anxious affect  Breasts: Status post bilateral mastectomies with implants in place. There is no evidence of local recurrence. Both axillae are benign.  LAB RESULTS: Lab Results  Component Value Date   WBC 5.8 06/18/2014   NEUTROABS 3.9 06/18/2014   HGB 14.0 06/18/2014    HCT 41.5 06/18/2014   MCV 92.4 06/18/2014   PLT 210 06/18/2014      Chemistry      Component Value Date/Time   NA 143 06/18/2014 1309   NA 140 01/08/2014 1459   K 4.0 06/18/2014 1309   K 4.5 01/08/2014 1459   CL 103 01/08/2014 1459   CL 104 09/23/2012 1415   CO2 27 06/18/2014 1309   CO2 29 01/08/2014 1459   BUN 11.6 06/18/2014 1309   BUN 12 01/08/2014 1459   CREATININE 0.8 06/18/2014 1309   CREATININE 0.76 01/08/2014 1459   CREATININE 0.70 05/05/2013 1059      Component Value Date/Time   CALCIUM 10.3 06/18/2014 1309   CALCIUM 9.8 01/08/2014 1459   ALKPHOS 94 06/18/2014 1309   ALKPHOS 87 01/08/2014 1459   AST 28 06/18/2014 1309   AST 33 01/08/2014 1459   ALT 23 06/18/2014 1309   ALT 21 01/08/2014 1459   BILITOT 0.90 06/18/2014 1309   BILITOT 0.5 01/08/2014 1459    Results for TAMANA, HATFIELD (MRN [id number]) as of 07/07/2014 12:22  Ref. Range 03/25/2012 14:59 05/15/2013 11:25 12/08/2013 15:29 02/04/2014 15:36 06/18/2014 13:10  Estradiol, Ultra Sensitive Latest Units: pg/mL 823 113 19 20 <2    STUDIES: No results found.   ASSESSMENT: 44 y.o.  BRCA 1-2 negative [name],  woman,   (1) status post right breast biopsy in September 2012 for a clinically 2.0 cm invasive ductal carcinoma, with some enlarged Right axillary lymph nodes, the largest one of which was negative by biopsy. The tumor was grade 2, estrogen receptor positive at 88%, progesterone receptor positive at 11%, HER-2/neu positive by CISH with  a ratio of 4.61, and a proliferation marker of 36%.   (2) treated in the neoadjuvant setting with Q 3 week docetaxel/ carboplatin/ trastuzumab x6, completed 05/18/2011.  Continued on trastuzumab every 3 weeks to complete one year, last dose in November 2013.  (3) s/p bilateral mastectomies with sentinel node biopsy 06/29/2011 for a residual right-sided ypT1c ypN0 invasive ductal carcinoma, grade 2, with immediate expander placement  (4) on tamoxifen as of May 2013,  discontinued in early 2015 due to breast cancer recurrence   (5) status post left lumpectomy with left-sided sentinel lymph node sampling 05/05/2013 for a pT1b pN0, stage IA invasive ductal carcinoma, estrogen receptor 94% positive, progesterone receptor 11% positive, with an MIB-1 of 19% and HER-2 amplification  (6)  Received TDM-1 every 3 weeks x 8 doses completed 10/09/2013; echo 08/25/2013 showed a well preserved ejection fraction  (7) adjuvant radiation therapy completed 01/08/2014  (8) to start anastrozole 04/10/2014   (a) [place] and estradiol in menopausal range 02/04/2014, to be followed every 3 months  (b) bone density 01/15/2013 showed a T score of -1.1  (9) genetic testing (23 genes associated with hereditary ovarian cancer through [facility], April 2015) 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   PLAN:  Rishita Petron is doing well on anastrozole by my count, but she is attributing a variety of symptoms to this drug which probably are not related to it and so she would like to switch. Specifically she is requesting letrozole. I have no problem switching to letrozole which I consider equivalent to anastrozole. I did tell her that I don't think her back pain or neck pain or insomnia is related to the anastrozole and therefore those problems will not get better when she switches.  I also alerted her to a possible change and Price at because letrozole may not be on her formulary, although it is available as a generic.  In any case I am delighted that her estradiol is now essentially not measurable. At is the result we want. I reassured her that her back pain is not due to the hemangioma that was incidentally noted there. It is going to be due to muscle strain. She is considering a Restaurant manager, fast food and I have no problems with that. Alternatively she could try some physical therapy exercises for that area and she agreed to a referral to our physical therapy  group without an mind.  Otherwise she will return to see [place] in 3 months. We will at that time again review how she is doing on aromatase inhibitors and check on her estradiol level. She has a good understanding of this plan. She will call with any problems that may develop before that visit.    Chauncey Cruel, MD     07/07/2014

## 2014-07-10 ENCOUNTER — Telehealth: Payer: Self-pay | Admitting: *Deleted

## 2014-07-10 NOTE — Telephone Encounter (Signed)
This RN returned call to pt and discussed with her - her message inquiring about " what is trace fluid in pelvis "  This RN discussed above reading on PET/ CT and overall normal reading in a female with constipation - hence the meaning of physiologic.  [name] verbalized appreciation of above " because I researched it and it was kinda talking about how fluid could be related to a cancer"  reassurance given and no further questions at this time.

## 2014-07-14 ENCOUNTER — Other Ambulatory Visit: Payer: Self-pay | Admitting: *Deleted

## 2014-07-14 ENCOUNTER — Other Ambulatory Visit: Payer: Self-pay | Admitting: Oncology

## 2014-07-14 ENCOUNTER — Other Ambulatory Visit: Payer: Self-pay | Admitting: Obstetrics

## 2014-07-15 ENCOUNTER — Ambulatory Visit: Payer: Self-pay | Admitting: Obstetrics

## 2014-07-15 ENCOUNTER — Ambulatory Visit (INDEPENDENT_AMBULATORY_CARE_PROVIDER_SITE_OTHER): Payer: BLUE CROSS/BLUE SHIELD | Admitting: Obstetrics

## 2014-07-15 ENCOUNTER — Encounter: Payer: Self-pay | Admitting: Obstetrics

## 2014-07-15 VITALS — BP 116/74 | HR 72 | Ht 63.0 in

## 2014-07-15 DIAGNOSIS — B373 Candidiasis of vulva and vagina: Secondary | ICD-10-CM

## 2014-07-15 DIAGNOSIS — B3731 Acute candidiasis of vulva and vagina: Secondary | ICD-10-CM

## 2014-07-15 DIAGNOSIS — Z124 Encounter for screening for malignant neoplasm of cervix: Secondary | ICD-10-CM | POA: Diagnosis not present

## 2014-07-15 DIAGNOSIS — Z01419 Encounter for gynecological examination (general) (routine) without abnormal findings: Secondary | ICD-10-CM | POA: Diagnosis not present

## 2014-07-15 DIAGNOSIS — R140 Abdominal distension (gaseous): Secondary | ICD-10-CM

## 2014-07-15 LAB — POCT URINALYSIS DIPSTICK
Bilirubin, UA: NEGATIVE
Blood, UA: NEGATIVE
Glucose, UA: NEGATIVE
Ketones, UA: NEGATIVE
Leukocytes, UA: NEGATIVE
Nitrite, UA: NEGATIVE
Protein, UA: NEGATIVE
Spec Grav, UA: <=1.005
Urobilinogen, UA: NEGATIVE
pH, UA: 8.5

## 2014-07-15 LAB — LIPID PANEL
Cholesterol: 188 mg/dL (ref 0–200)
HDL: 83 mg/dL (ref >=46)
LDL Cholesterol: 91 mg/dL (ref 0–99)
Total CHOL/HDL Ratio: 2.3 Ratio
Triglycerides: 71 mg/dL (ref <150)
VLDL: 14 mg/dL (ref 0–40)

## 2014-07-15 LAB — TSH: TSH: 1.132 u[IU]/mL (ref 0.350–4.500)

## 2014-07-15 MED ORDER — FLUCONAZOLE 150 MG PO TABS: 150.0000 mg | ORAL_TABLET | Freq: Once | ORAL | Status: DC

## 2014-07-15 NOTE — Progress Notes (Signed)
Subjective:        [name] is a 44 y.o. female here for a routine exam.  Current complaints: None.    Personal health questionnaire:  Is patient Ashkenazi Jewish, have a family history of breast and/or ovarian cancer: no Is there a family history of uterine cancer diagnosed at age < 80, gastrointestinal cancer, urinary tract cancer, family member who is a Field seismologist syndrome-associated carrier: no Is the patient overweight and hypertensive, family history of diabetes, personal history of gestational diabetes, preeclampsia or PCOS: no Is patient over 80, have PCOS,  family history of premature CHD under age 84, diabetes, smoke, have hypertension or peripheral artery disease:  no At any time, has a partner hit, kicked or otherwise hurt or frightened you?: no Over the past 2 weeks, have you felt down, depressed or hopeless?: no Over the past 2 weeks, have you felt little interest or pleasure in doing things?:no   Gynecologic History No LMP recorded. Patient is not currently having periods (Reason: Chemotherapy). Contraception: none Last Pap: 2015. Results were: normal Last mammogram: 2015. Results were: normal  Obstetric History OB History  No data available    Past Medical History  Diagnosis Date  . Migraines   . Asthma     as a child  . PONV (postoperative nausea and vomiting)   . Rash     neck  . Anxiety   . Pneumonia   . Kidney stones   . GERD (gastroesophageal reflux disease)     had during chemo  . Cervical dysplasia   . Breast cancer Sept 2012    right breast, inner lower right  . Hx of radiation therapy 11/24/13- 01/08/14    reconstructed right breast/chest wall 4500 cGy 25 sessions, electron beam boost 1440 cGy 8 sessions    Past Surgical History  Procedure Laterality Date  . Rhinoplasty    . Adenoidectomy    . Fibroid tumor surgery    . Dilation and curettage of uterus    . Uterine cervical treatments  2002    for precancerous lesions  . Lazy eye       corrective surgery  . Mastectomy w/ sentinel node biopsy  06/29/2011    Procedure: MASTECTOMY WITH SENTINEL LYMPH NODE BIOPSY;  Surgeon: Adin Hector, MD;  Location: [facility];  Service: General;  Laterality: Right;  right skin spairing mstectomy and right sentinel lymph node biopsy  . Breast reconstruction  06/29/2011    Procedure: BREAST RECONSTRUCTION;  Surgeon: Theodoro Kos, DO;  Location: [facility];  Service: Plastics;  Laterality: Bilateral;  Immediate Bilateral Breast Reconstruction with Bilateral Tissue Expanders and placement of  Alloderm   . Port-a-cath removal  03/12/2012    Procedure: MINOR REMOVAL PORT-A-CATH;  Surgeon: Adin Hector, MD;  Location: [facility];  Service: General;  Laterality: N/A;  Upper left  . Breast lumpectomy with needle localization Right 05/05/2013    Procedure: EXCISION RECURRENT CANCER RIGHT BREAST WITH NEEDLE LOCALOZATION;  Surgeon: Adin Hector, MD;  Location: [facility];  Service: General;  Laterality: Right;  . Portacath placement Left 05/05/2013    Procedure: INSERTION PORT-A-CATH;  Surgeon: Adin Hector, MD;  Location: [facility];  Service: General;  Laterality: Left;     Current outpatient prescriptions:  .  Biotin 5000 MCG CAPS, Take 5,000 mcg by mouth daily., Disp: , Rfl:  .  calcium & magnesium carbonates (MYLANTA) 311-232 MG per tablet, Take 1 tablet by mouth daily., Disp: ,  Rfl:  .  letrozole (FEMARA) 2.5 MG tablet, Take 1 tablet (2.5 mg total) by mouth daily., Disp: 90 tablet, Rfl: 4 .  Multiple Vitamin (MULTIVITAMIN) tablet, Take 1 tablet by mouth daily., Disp: , Rfl:  .  Multiple Vitamins-Minerals (HAIR/SKIN/NAILS PO), Take 1 tablet by mouth daily. , Disp: , Rfl:  .  prenatal vitamin w/FE, FA (PRENATAL 1 + 1) 27-1 MG TABS tablet, Take 1 tablet by mouth daily., Disp: 90 each, Rfl: 4 .  traMADol (ULTRAM) 50 MG tablet, TAKE ONE TABLET BY MOUTH EVERY 6 HOURS AS NEEDED FOR MODERATE PAIN, Disp: 30 tablet, Rfl: 0 .  vitamin E 100 UNIT  capsule, Take by mouth daily., Disp: , Rfl:  .  anastrozole (ARIMIDEX) 1 MG tablet, Take 1 tablet (1 mg total) by mouth daily., Disp: 90 tablet, Rfl: 4 .  aspirin-acetaminophen-caffeine (EXCEDRIN MIGRAINE) 250-250-65 MG per tablet, Take 2 tablets by mouth every 6 (six) hours as needed for headache., Disp: , Rfl:  .  doxycycline (VIBRA-TABS) 100 MG tablet, Take 1 tablet (100 mg total) by mouth 2 (two) times daily., Disp: 30 tablet, Rfl: 6 .  fluconazole (DIFLUCAN) 150 MG tablet, Take 1 tablet (150 mg total) by mouth once., Disp: 1 tablet, Rfl: 2 .  ibuprofen (ADVIL,MOTRIN) 800 MG tablet, Take 1 tablet (800 mg total) by mouth every 8 (eight) hours as needed., Disp: 60 tablet, Rfl: 5 .  LORazepam (ATIVAN) 0.5 MG tablet, TAKE ONE TABLET BY MOUTH TWICE DAILY AS NEEDED FOR ANXIETY, Disp: 60 tablet, Rfl: 0 .  prochlorperazine (COMPAZINE) 10 MG tablet, Take 1 tablet (10 mg total) by mouth every 6 (six) hours as needed for nausea or vomiting., Disp: 30 tablet, Rfl: 0 Allergies  Allergen Reactions  . Tussionex Pennkinetic Er [Hydrocod Polst-Cpm Polst Er] Other (See Comments)    hallucinations  . Codeine Other (See Comments)    unkown  . Morphine Rash  . Morphine And Related Anxiety    anxiety  . Penicillin G Rash  . Penicillins Rash    History  Substance Use Topics  . Smoking status: Former Smoker    Quit date: 07/20/1996  . Smokeless tobacco: Never Used  . Alcohol Use: No    Family History  Problem Relation Age of Onset  . Adopted: Yes  . Heart attack Father   . Cancer Cousin 40    melanoma; mat first cousin, located on neck  . Cancer Maternal Aunt 60    stomach and ovarian as separate primaries  . Cancer Maternal Uncle 65    lung; smoker  . Cancer Maternal Grandmother 77    enodometrial  . Cancer Maternal Aunt 51    breast  . Cancer Maternal Uncle 63    prostate      Review of Systems  Constitutional: negative for fatigue and weight loss Respiratory: negative for cough and  wheezing Cardiovascular: negative for chest pain, fatigue and palpitations Gastrointestinal: negative for abdominal pain and change in bowel habits Musculoskeletal:negative for myalgias Neurological: negative for gait problems and tremors Behavioral/Psych: negative for abusive relationship, depression Endocrine: negative for temperature intolerance   Genitourinary:negative for abnormal menstrual periods, genital lesions, hot flashes, sexual problems and vaginal discharge Integument/breast: negative for breast lump, breast tenderness, nipple discharge and skin lesion(s)    Objective:       BP 116/74 mmHg  Pulse 72  Ht 5\' 3"  (1.6 m) General:   alert  Skin:   no rash or abnormalities  Lungs:   clear to auscultation  bilaterally  Heart:   regular rate and rhythm, S1, S2 normal, no murmur, click, rub or gallop  Breasts:   normal without suspicious masses, skin or nipple changes or axillary nodes  Abdomen:  normal findings: no organomegaly, soft, non-tender and no hernia  Pelvis:  External genitalia: normal general appearance Urinary system: urethral meatus normal and bladder without fullness, nontender Vaginal: normal without tenderness, induration or masses Cervix: normal appearance Adnexa: normal bimanual exam Uterus: anteverted and non-tender, normal size   Lab Review Urine pregnancy test Labs reviewed yes Radiologic studies reviewed yes    Assessment:    Healthy female exam.    H/O Breast CA   Plan:    Education reviewed: low fat, low cholesterol diet, self breast exams and weight bearing exercise. Follow up in: 1 year.   Meds ordered this encounter  Medications  . vitamin E 100 UNIT capsule    Sig: Take by mouth daily.  . calcium & magnesium carbonates (MYLANTA) 349-179 MG per tablet    Sig: Take 1 tablet by mouth daily.  . fluconazole (DIFLUCAN) 150 MG tablet    Sig: Take 1 tablet (150 mg total) by mouth once.    Dispense:  1 tablet    Refill:  2   Orders  Placed This Encounter  Procedures  . SureSwab, Vaginosis/Vaginitis Plus  . TSH  . HIV antibody  . Hepatitis B surface antigen  . RPR  . Hepatitis C antibody  . Lipid Profile  . Vitamin D 25 hydroxy  . CA 125    Chronic bloating.  [name] POCT urinalysis dipstick

## 2014-07-16 ENCOUNTER — Other Ambulatory Visit: Payer: Self-pay | Admitting: Oncology

## 2014-07-16 LAB — CA 125: CA 125: 19 U/mL (ref <35)

## 2014-07-16 LAB — HIV ANTIBODY (ROUTINE TESTING W REFLEX): HIV 1&2 Ab, 4th Generation: NONREACTIVE

## 2014-07-16 LAB — VITAMIN D 25 HYDROXY (VIT D DEFICIENCY, FRACTURES): Vit D, 25-Hydroxy: 34 ng/mL (ref 30–100)

## 2014-07-16 LAB — RPR

## 2014-07-16 LAB — HEPATITIS B SURFACE ANTIGEN: Hepatitis B Surface Ag: NEGATIVE

## 2014-07-16 LAB — HEPATITIS C ANTIBODY: HCV Ab: NEGATIVE

## 2014-07-17 LAB — PAP IG AND HPV HIGH-RISK: HPV DNA High Risk: NOT DETECTED

## 2014-07-19 LAB — SURESWAB, VAGINOSIS/VAGINITIS PLUS
Atopobium vaginae: NOT DETECTED Log (cells/mL)
BV CATEGORY: UNDETERMINED — AB
C. albicans, DNA: NOT DETECTED
C. glabrata, DNA: DETECTED — AB
C. parapsilosis, DNA: NOT DETECTED
C. trachomatis RNA, TMA: NOT DETECTED
C. tropicalis, DNA: NOT DETECTED
Gardnerella vaginalis: 6.4 Log (cells/mL)
LACTOBACILLUS SPECIES: 7.8 Log (cells/mL)
MEGASPHAERA SPECIES: NOT DETECTED Log (cells/mL)
N. gonorrhoeae RNA, TMA: NOT DETECTED
T. vaginalis RNA, QL TMA: NOT DETECTED

## 2014-07-20 ENCOUNTER — Other Ambulatory Visit: Payer: Self-pay | Admitting: Obstetrics

## 2014-07-20 DIAGNOSIS — N760 Acute vaginitis: Secondary | ICD-10-CM

## 2014-07-20 DIAGNOSIS — B3731 Acute candidiasis of vulva and vagina: Secondary | ICD-10-CM

## 2014-07-20 DIAGNOSIS — B373 Candidiasis of vulva and vagina: Secondary | ICD-10-CM

## 2014-07-20 DIAGNOSIS — B9689 Other specified bacterial agents as the cause of diseases classified elsewhere: Secondary | ICD-10-CM

## 2014-07-20 MED ORDER — TINIDAZOLE 500 MG PO TABS: 1000.0000 mg | ORAL_TABLET | Freq: Every day | ORAL | Status: DC

## 2014-07-20 MED ORDER — FLUCONAZOLE 150 MG PO TABS: 150.0000 mg | ORAL_TABLET | Freq: Once | ORAL | Status: DC

## 2014-07-28 ENCOUNTER — Other Ambulatory Visit: Payer: Self-pay | Admitting: *Deleted

## 2014-07-28 ENCOUNTER — Other Ambulatory Visit: Payer: Self-pay | Admitting: Oncology

## 2014-07-29 ENCOUNTER — Other Ambulatory Visit: Payer: Self-pay | Admitting: Oncology

## 2014-08-25 ENCOUNTER — Telehealth: Payer: Self-pay | Admitting: Nurse Practitioner

## 2014-08-25 NOTE — Telephone Encounter (Signed)
Called and left a message with new appointment date and time

## 2014-09-04 ENCOUNTER — Telehealth: Payer: Self-pay | Admitting: Oncology

## 2014-09-04 ENCOUNTER — Telehealth: Payer: Self-pay | Admitting: Nurse Practitioner

## 2014-09-04 NOTE — Telephone Encounter (Signed)
Patient called and moved appointment for 06/28 to 06/27. Patient unable to do 06/28.

## 2014-09-04 NOTE — Telephone Encounter (Signed)
Moved 6/28 appointment to AM due to PM PAL. Left message for patient and mailed schedule. Other appointments remain the same.

## 2014-09-08 ENCOUNTER — Other Ambulatory Visit: Payer: Self-pay | Admitting: Oncology

## 2014-09-23 ENCOUNTER — Other Ambulatory Visit: Payer: BLUE CROSS/BLUE SHIELD

## 2014-09-23 ENCOUNTER — Other Ambulatory Visit (HOSPITAL_BASED_OUTPATIENT_CLINIC_OR_DEPARTMENT_OTHER): Payer: BLUE CROSS/BLUE SHIELD

## 2014-09-23 DIAGNOSIS — C50912 Malignant neoplasm of unspecified site of left female breast: Secondary | ICD-10-CM | POA: Diagnosis not present

## 2014-09-23 DIAGNOSIS — C50911 Malignant neoplasm of unspecified site of right female breast: Secondary | ICD-10-CM

## 2014-09-23 DIAGNOSIS — Z9013 Acquired absence of bilateral breasts and nipples: Secondary | ICD-10-CM

## 2014-09-23 DIAGNOSIS — C50311 Malignant neoplasm of lower-inner quadrant of right female breast: Secondary | ICD-10-CM | POA: Diagnosis not present

## 2014-09-23 LAB — CBC WITH DIFFERENTIAL/PLATELET
BASO%: 0.2 % (ref 0.0–2.0)
Basophils Absolute: 0 10*3/uL (ref 0.0–0.1)
EOS%: 1.2 % (ref 0.0–7.0)
Eosinophils Absolute: 0.1 10*3/uL (ref 0.0–0.5)
HCT: 40.4 % (ref 34.8–46.6)
HGB: 13.8 g/dL (ref 11.6–15.9)
LYMPH%: 28.1 % (ref 14.0–49.7)
MCH: 31.7 pg (ref 25.1–34.0)
MCHC: 34.2 g/dL (ref 31.5–36.0)
MCV: 92.7 fL (ref 79.5–101.0)
MONO#: 0.3 10*3/uL (ref 0.1–0.9)
MONO%: 5.7 % (ref 0.0–14.0)
NEUT#: 3.9 10*3/uL (ref 1.5–6.5)
NEUT%: 64.8 % (ref 38.4–76.8)
Platelets: 222 10*3/uL (ref 145–400)
RBC: 4.36 10*6/uL (ref 3.70–5.45)
RDW: 12.5 % (ref 11.2–14.5)
WBC: 6 10*3/uL (ref 3.9–10.3)
lymph#: 1.7 10*3/uL (ref 0.9–3.3)

## 2014-09-23 LAB — COMPREHENSIVE METABOLIC PANEL (CC13)
ALT: 24 U/L (ref 0–55)
AST: 25 U/L (ref 5–34)
Albumin: 4.2 g/dL (ref 3.5–5.0)
Alkaline Phosphatase: 100 U/L (ref 40–150)
Anion Gap: 8 mEq/L (ref 3–11)
BUN: 15.4 mg/dL (ref 7.0–26.0)
CO2: 29 mEq/L (ref 22–29)
Calcium: 9.5 mg/dL (ref 8.4–10.4)
Chloride: 104 mEq/L (ref 98–109)
Creatinine: 0.9 mg/dL (ref 0.6–1.1)
EGFR: 81 mL/min/{1.73_m2} — ABNORMAL LOW (ref >90)
Glucose: 74 mg/dl (ref 70–140)
Potassium: 4.4 mEq/L (ref 3.5–5.1)
Sodium: 142 mEq/L (ref 136–145)
Total Bilirubin: 0.58 mg/dL (ref 0.20–1.20)
Total Protein: 6.7 g/dL (ref 6.4–8.3)

## 2014-09-23 LAB — FOLLICLE STIMULATING HORMONE: FSH: 123.3 m[IU]/mL — ABNORMAL HIGH

## 2014-09-27 LAB — ESTRADIOL, ULTRA SENS: Estradiol, Ultra Sensitive: 41 pg/mL

## 2014-09-28 ENCOUNTER — Other Ambulatory Visit: Payer: Self-pay | Admitting: Oncology

## 2014-10-05 ENCOUNTER — Telehealth: Payer: Self-pay | Admitting: Oncology

## 2014-10-05 ENCOUNTER — Ambulatory Visit (HOSPITAL_BASED_OUTPATIENT_CLINIC_OR_DEPARTMENT_OTHER): Payer: BLUE CROSS/BLUE SHIELD | Admitting: Nurse Practitioner

## 2014-10-05 VITALS — BP 95/64 | HR 67 | Temp 98.0°F | Resp 18 | Wt 119.3 lb

## 2014-10-05 DIAGNOSIS — C50311 Malignant neoplasm of lower-inner quadrant of right female breast: Secondary | ICD-10-CM | POA: Diagnosis not present

## 2014-10-05 NOTE — Progress Notes (Signed)
ID: [name]   DOB: May 26, 1970  MR#: [id number]  CSN#:[id number]   PCP: Chauncey Cruel, MD GYN: Baltazar Najjar MD SU: Judith Skates MD OTHER MD: Theodoro Kos, [name], [name]  CHIEF COMPLAINT:  Recurrent Breast Cancer, Right CURRENT THERAPY: on aromatase inhibotirs   BREAST CANCER HISTORY:  From the original intake note:  "[name] (goes by "[name]") is a [place] woman who, at the age of 44, was referred by Dr. Baltazar Najjar for treatment of right breast carcinoma.   1.  The patient palpated a mass in her right breast which she brought to Dr. Jacelyn Grip attention. A prior screening mammogram on 02/06/2010 had been unremarkable. The patient was then scheduled for bilateral diagnostic mammogram and right breast ultrasonography on 12/22/2010, the studies demonstrating an ill defined mass with heterogeneous calcifications in the inner lower right breast, measuring 1.7 cm. The breasts are heterogeneously dense, but there were no other mammographic abnormalities in either breast. Clinically, there was a palpable firm mass at the 5:00 position of the right breast, 5 cm from the nipple. There were no palpable abnormalities in the right axilla. Ultrasound confirmed a 1.7 cm irregular, hypoechoic mass in the same position. There were some lymph nodes noted in the right exam of, one measuring 1.6 cm with a minimally thickened cortex inferiorly.  An ultrasound-guided core biopsy on 12/23/2010 [phone]) showed an invasive ductal carcinoma, grade 2, ER positive at 88%, PR positive at 11 is %, HER-2/neu positive with a FISH ratio of 4.61, and an MIB-1 of 36%.  The patient was referred to Dr. Fanny [name] and bilateral breast MRIs were obtained 12/30/2010. This showed the mass in question in the right breast to measure 2.0 cm. In the right axilla there was a 1.8 cm lymph node with a thickened cortex. The left breast was unremarkable.  Her subsequent treatment is as  detailed below.  2.   [name] then had a restaging PET scan late December 2014 which was positive only for a small subcutaneous nodule in the right breast lower outer quadrant. There was no other finding of concern. Ultrasound of both breasts 04/08/2013 showed a nodule in the right breast at the 4:00 position measuring 0.7 cm. The right axilla was clear. There was an area at the 7:00 position of the left breast which was of concern by palpation but showed only normal tissue.   Biopsy of the right breast mass 04/09/2013 showed an invasive ductal carcinoma, grade 2, estrogen receptor 94% positive, progesterone receptor 11% positive, with an MIB-1 of 19% and HER-2 amplified with a signals ratio of 3.75 and the number per cell of 4.50. The patient discussed the situation with with her surgeon's and on 05/05/2013 proceeded to right lumpectomy, with the final pathology (S. is a [id number]) confirming an invasive ductal carcinoma, 0.7 cm, grade 2, within a millimeter of the posterior margin, which showed skeletal muscle. A port was placed at the time of that procedure."  Her subsequent treatment is as detailed below.  INTERVAL HISTORY:  [name] returns today for follow up of her recurrent right breast cancer. At her last visit she was switched to letrozole because of intolerance of the anastrozole. She states she tolerates this new drug with a few complaints, but they are not as severe as when she was anastrozole. She does complain of generalized achyness. She also states that she is more fatigued than usual and is attributing this to the letrozole. She is not regularly physically active,  though she seems to understand that would help both complaints in some manner. Her hot flashes are milder and she complains of new vaginal wetness instead of dryness. Interestingly, the hormone levels drawn 2 weeks ago, showed her estrogen back up to premenopausal levels.  REVIEW OF SYSTEMS:  [name] denies fevers, chills,  nausea, or vomiting. She has intermittent constipation, but does not take stool softeners or miralax regularly. She was recently treated for a yeast infection. Her appetite is decent, but she has lost 5 pounds. She has chronic back pain and now complains that her right shoulder range of motion has decreased. She complains of tightness to her shoulder, but she does not think physical therapy will help. She takes tramadol PRN pain. She has dull headaches. She endorses anxiety but no depression. She does not sleep well. A detailed review of systems is otherwise stable.  PAST MEDICAL HISTORY: Past Medical History  Diagnosis Date  . Migraines   . Asthma     as a child  . PONV (postoperative nausea and vomiting)   . Rash     neck  . Anxiety   . Pneumonia   . Kidney stones   . GERD (gastroesophageal reflux disease)     had during chemo  . Cervical dysplasia   . Breast cancer Sept 2012    right breast, inner lower right  . Hx of radiation therapy 45/17/15- 01/08/14    reconstructed right breast/chest wall 4500 cGy 25 sessions, electron beam boost 1440 cGy 8 sessions  1. History of prior adenoidectomy. 2. History of rhinoplasty.   3. History of multiple uterine cervical treatments for precancerous lesions, none for the past 10 years or so. 4. History of fibroid tumor surgery. 5. History of D and C. 6. History of dysplastic nevi removed by Dr. Ronnald Ramp. 7. History of right "lazy eye" corrective surgery. 8. History of approximately 10-pack-years of tobacco abuse, resolved.  9. History of possible migraines (morning headaches). History of asthma as a child.  PAST SURGICAL HISTORY: Past Surgical History  Procedure Laterality Date  . Rhinoplasty    . Adenoidectomy    . Fibroid tumor surgery    . Dilation and curettage of uterus    . Uterine cervical treatments  2002    for precancerous lesions  . Lazy eye      corrective surgery  . Mastectomy w/ sentinel node biopsy  06/29/2011    Procedure:  MASTECTOMY WITH SENTINEL LYMPH NODE BIOPSY;  Surgeon: Adin Hector, MD;  Location: [facility];  Service: General;  Laterality: Right;  right skin spairing mstectomy and right sentinel lymph node biopsy  . Breast reconstruction  06/29/2011    Procedure: BREAST RECONSTRUCTION;  Surgeon: Theodoro Kos, DO;  Location: [facility];  Service: Plastics;  Laterality: Bilateral;  Immediate Bilateral Breast Reconstruction with Bilateral Tissue Expanders and placement of  Alloderm   . Port-a-cath removal  03/12/2012    Procedure: MINOR REMOVAL PORT-A-CATH;  Surgeon: Adin Hector, MD;  Location: [facility];  Service: General;  Laterality: N/A;  Upper left  . Breast lumpectomy with needle localization Right 05/05/2013    Procedure: EXCISION RECURRENT CANCER RIGHT BREAST WITH NEEDLE LOCALOZATION;  Surgeon: Adin Hector, MD;  Location: [facility];  Service: General;  Laterality: Right;  . Portacath placement Left 05/05/2013    Procedure: INSERTION PORT-A-CATH;  Surgeon: Adin Hector, MD;  Location: [facility];  Service: General;  Laterality: Left;    FAMILY HISTORY Family History  Problem Relation Age of Onset  . Adopted: Yes  . Heart attack Father   . Cancer Cousin 40    melanoma; mat first cousin, located on neck  . Cancer Maternal Aunt 60    stomach and ovarian as separate primaries  . Cancer Maternal Uncle 65    lung; smoker  . Cancer Maternal Grandmother 99    enodometrial  . Cancer Maternal Aunt 55    breast  . Cancer Maternal Uncle 63    prostate  :  The patient's biological father died at the age of 39 from myocardial infarction.  The patient's biological mother is alive at age 28.  The patient was adopted by her biological mother's parents.  She has 3 half-brothers.  No sisters.  One cousin on her mother's side died from melanoma at the age of 61.  There is other cancer history in the adopted side but these are not even half-brothers or sisters, they are children of her adopted parents  who are really her grandparents so these would be uncles and aunts (1 lymph node cancer, 1 prostate cancer, 1 breast cancer at the age of 78).  GYNECOLOGIC HISTORY:   (Updated 05/22/2013) She had menarche age 59 or 75.  She is GX, P1, first pregnancy to term at age 19. She stopped having periods approximately March 2013.  Hormone levels in February 2015 show patient to be premenopausal, with an estradiol of 113 on 05/15/2013, and her periods indeed resumed February of 2015  SOCIAL HISTORY: (Updated 06/12/2013) She works in a Engineer, agricultural.  Her husband, Sondi Desch owns a family business called [employer].  They have been married 5 years.  They have a son, [name], age 51. [name] also has a daughter, [name], who lives in [place] and is 45 years old.  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  The patient is not a church attender.    ADVANCED DIRECTIVES: Not in place  HEALTH MAINTENANCE:  (Updated 06/12/2013) History  Substance Use Topics  . Smoking status: Former Smoker    Quit date: 07/20/1996  . Smokeless tobacco: Never Used  . Alcohol Use: No     Colonoscopy:  Never  PAP: Oct 2014/[facility]  Bone density: Oct 2014, mild osteopenia with a T score of -1.1  Lipid panel: Not on file  Allergies  Allergen Reactions  . Tussionex Pennkinetic Er [Hydrocod Polst-Cpm Polst Er] Other (See Comments)    hallucinations  . Codeine Other (See Comments)    unkown  . Morphine Rash  . Morphine And Related Anxiety    anxiety  . Penicillin G Rash  . Penicillins Rash    Current Outpatient Prescriptions  Medication Sig Dispense Refill  . aspirin-acetaminophen-caffeine (EXCEDRIN MIGRAINE) 250-250-65 MG per tablet Take 2 tablets by mouth every 6 (six) hours as needed for headache.    . Biotin 5000 MCG CAPS Take 5,000 mcg by mouth daily.    [name] doxycycline (VIBRA-TABS) 100 MG tablet TAKE ONE TABLET BY MOUTH TWICE DAILY 30 tablet 0  . letrozole (FEMARA) 2.5 MG tablet Take 1 tablet (2.5 mg  total) by mouth daily. 90 tablet 4  . prenatal vitamin w/FE, FA (PRENATAL 1 + 1) 27-1 MG TABS tablet Take 1 tablet by mouth daily. 90 each 4  . traMADol (ULTRAM) 50 MG tablet TAKE ONE TABLET BY MOUTH EVERY 6 HOURS AS NEEDED FOR PAIN 30 tablet 0  . anastrozole (ARIMIDEX) 1 MG tablet Take 1 tablet (1 mg total) by mouth  daily. (Patient not taking: Reported on 10/05/2014) 90 tablet 4  . calcium & magnesium carbonates (MYLANTA) 311-232 MG per tablet Take 1 tablet by mouth daily.    . fluconazole (DIFLUCAN) 150 MG tablet Take 1 tablet (150 mg total) by mouth once. (Patient not taking: Reported on 10/05/2014) 1 tablet 2  . ibuprofen (ADVIL,MOTRIN) 800 MG tablet Take 1 tablet (800 mg total) by mouth every 8 (eight) hours as needed. (Patient not taking: Reported on 10/05/2014) 60 tablet 5  . LORazepam (ATIVAN) 0.5 MG tablet TAKE ONE TABLET BY MOUTH TWICE DAILY AS NEEDED FOR ANXIETY (Patient not taking: Reported on 10/05/2014) 60 tablet 0   No current facility-administered medications for this visit.    OBJECTIVE: Middle-aged white woman in no acute distress Filed Vitals:   10/05/14 1433  BP: 95/64  Pulse: 67  Temp: 98 F (36.7 C)  Resp: 18     Body mass index is 21.14 kg/(m^2).    ECOG FS: 1 Filed Weights   10/05/14 1433  Weight: 119 lb 4.8 oz (54.114 kg)    Oropharynx clear, dentition in good repair No cervical or supraclavicular adenopathy Lungs no rales or rhonchi Heart regular rate and rhythm Abd soft, positive bowel sounds; nontender MSK no focal spinal tenderness including percussion of the lower spine Neuro: nonfocal, well oriented, anxious affect  Breasts: Status post bilateral mastectomies with implants in place. There is no evidence of local recurrence. Both axillae are benign.  LAB RESULTS: Lab Results  Component Value Date   WBC 6.0 09/23/2014   NEUTROABS 3.9 09/23/2014   HGB 13.8 09/23/2014   HCT 40.4 09/23/2014   MCV 92.7 09/23/2014   PLT 222 09/23/2014      Chemistry       Component Value Date/Time   NA 142 09/23/2014 1525   NA 140 01/08/2014 1459   K 4.4 09/23/2014 1525   K 4.5 01/08/2014 1459   CL 103 01/08/2014 1459   CL 104 09/23/2012 1415   CO2 29 09/23/2014 1525   CO2 29 01/08/2014 1459   BUN 15.4 09/23/2014 1525   BUN 12 01/08/2014 1459   CREATININE 0.9 09/23/2014 1525   CREATININE 0.76 01/08/2014 1459   CREATININE 0.70 05/05/2013 1059      Component Value Date/Time   CALCIUM 9.5 09/23/2014 1525   CALCIUM 9.8 01/08/2014 1459   ALKPHOS 100 09/23/2014 1525   ALKPHOS 87 01/08/2014 1459   AST 25 09/23/2014 1525   AST 33 01/08/2014 1459   ALT 24 09/23/2014 1525   ALT 21 01/08/2014 1459   BILITOT 0.58 09/23/2014 1525   BILITOT 0.5 01/08/2014 1459      STUDIES: No results found.  ASSESSMENT: 44 y.o.  BRCA 1-2 negative [name], [place] woman,   (1) status post right breast biopsy in September 2012 for a clinically 2.0 cm invasive ductal carcinoma, with some enlarged Right axillary lymph nodes, the largest one of which was negative by biopsy. The tumor was grade 2, estrogen receptor positive at 88%, progesterone receptor positive at 11%, HER-2/neu positive by CISH with a ratio of 4.61, and a proliferation marker of 36%.   (2) treated in the neoadjuvant setting with Q 3 week docetaxel/ carboplatin/ trastuzumab x6, completed 05/18/2011.  Continued on trastuzumab every 3 weeks to complete one year, last dose in November 2013.  (3) s/p bilateral mastectomies with sentinel node biopsy 06/29/2011 for a residual right-sided ypT1c ypN0 invasive ductal carcinoma, grade 2, with immediate expander placement  (4) on tamoxifen as of  May 2013, discontinued in early 2015 due to breast cancer recurrence   (5) status post left lumpectomy with left-sided sentinel lymph node sampling 05/05/2013 for a pT1b pN0, stage IA invasive ductal carcinoma, estrogen receptor 94% positive, progesterone receptor 11% positive, with an MIB-1 of 19% and HER-2  amplification  (6)  Received TDM-1 every 3 weeks x 8 doses completed 10/09/2013; echo 08/25/2013 showed a well preserved ejection fraction  (7) adjuvant radiation therapy completed 01/08/2014  (8) to start anastrozole 04/10/2014   (a) [place] and estradiol in menopausal range 02/04/2014, to be followed every 3 months  (b) bone density 01/15/2013 showed a T score of -1.1  (c) switched to letrozole 07/07/2014  (9) genetic testing (23 genes associated with hereditary ovarian cancer through [facility], April 2015) 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   PLAN:  Keymora Grillot is doing relatively well on the letrozole so far, but it hesitant to admit it because she "keeps waiting on the other shoe to drop." In any case, she will continue on this drug until she demonstrates an intolerance.  The labs were reviewed in detail and the CMET and CBC were completely normal. The estradiol level however has spiked back up to 40, despite reaching postmenopausal levels just 3 months ago. Dr. Virgie Dad advice is for her to either have her ovaries removed, or proceed with goserelin injections monthly. The drawback with the second option being she will have to have monthly injections and routine lab follow up. The patient had many questions about how this could have happened. We spent over 25 minutes on this topic, but all of her questions were answered. In the end she knows that for any aromatase inhibitor therapy, she must be postmenopausal for it to be be effective. At this time she would like to weigh her options. She will have these same labs drawn in 1 month and she will meed with Dr. Jana Hakim 2 weeks later to discuss the results. She understands and agrees with this plan. She knows the goal of treatment in her case is cure. She has been encouraged to call with any issues that might arise before her next visit here.    Laurie Panda, NP    10/05/2014

## 2014-10-05 NOTE — Telephone Encounter (Signed)
Gave avs & calendar for August °

## 2014-10-06 ENCOUNTER — Ambulatory Visit: Payer: BLUE CROSS/BLUE SHIELD | Admitting: Nurse Practitioner

## 2014-10-06 ENCOUNTER — Encounter: Payer: Self-pay | Admitting: Nurse Practitioner

## 2014-10-08 ENCOUNTER — Ambulatory Visit: Payer: BLUE CROSS/BLUE SHIELD | Admitting: Nurse Practitioner

## 2014-10-08 ENCOUNTER — Other Ambulatory Visit: Payer: BLUE CROSS/BLUE SHIELD | Admitting: Nurse Practitioner

## 2014-11-25 ENCOUNTER — Other Ambulatory Visit (HOSPITAL_BASED_OUTPATIENT_CLINIC_OR_DEPARTMENT_OTHER): Payer: BLUE CROSS/BLUE SHIELD

## 2014-11-25 DIAGNOSIS — C50311 Malignant neoplasm of lower-inner quadrant of right female breast: Secondary | ICD-10-CM

## 2014-11-25 DIAGNOSIS — Z9013 Acquired absence of bilateral breasts and nipples: Secondary | ICD-10-CM

## 2014-11-25 DIAGNOSIS — C50911 Malignant neoplasm of unspecified site of right female breast: Secondary | ICD-10-CM

## 2014-11-25 LAB — COMPREHENSIVE METABOLIC PANEL (CC13)
ALT: 24 U/L (ref 0–55)
AST: 25 U/L (ref 5–34)
Albumin: 4.2 g/dL (ref 3.5–5.0)
Alkaline Phosphatase: 93 U/L (ref 40–150)
Anion Gap: 9 mEq/L (ref 3–11)
BUN: 10.5 mg/dL (ref 7.0–26.0)
CO2: 26 mEq/L (ref 22–29)
Calcium: 9.5 mg/dL (ref 8.4–10.4)
Chloride: 104 mEq/L (ref 98–109)
Creatinine: 0.9 mg/dL (ref 0.6–1.1)
EGFR: 82 mL/min/{1.73_m2} — ABNORMAL LOW (ref >90)
Glucose: 101 mg/dl (ref 70–140)
Potassium: 4.2 mEq/L (ref 3.5–5.1)
Sodium: 140 mEq/L (ref 136–145)
Total Bilirubin: 0.7 mg/dL (ref 0.20–1.20)
Total Protein: 6.6 g/dL (ref 6.4–8.3)

## 2014-11-25 LAB — CBC WITH DIFFERENTIAL/PLATELET
BASO%: 0.6 % (ref 0.0–2.0)
Basophils Absolute: 0 10*3/uL (ref 0.0–0.1)
EOS%: 1.4 % (ref 0.0–7.0)
Eosinophils Absolute: 0.1 10*3/uL (ref 0.0–0.5)
HCT: 39.4 % (ref 34.8–46.6)
HGB: 13 g/dL (ref 11.6–15.9)
LYMPH%: 29 % (ref 14.0–49.7)
MCH: 31 pg (ref 25.1–34.0)
MCHC: 33.1 g/dL (ref 31.5–36.0)
MCV: 93.5 fL (ref 79.5–101.0)
MONO#: 0.3 10*3/uL (ref 0.1–0.9)
MONO%: 6.1 % (ref 0.0–14.0)
NEUT#: 3.3 10*3/uL (ref 1.5–6.5)
NEUT%: 62.9 % (ref 38.4–76.8)
Platelets: 237 10*3/uL (ref 145–400)
RBC: 4.21 10*6/uL (ref 3.70–5.45)
RDW: 12.7 % (ref 11.2–14.5)
WBC: 5.2 10*3/uL (ref 3.9–10.3)
lymph#: 1.5 10*3/uL (ref 0.9–3.3)

## 2014-11-26 LAB — FOLLICLE STIMULATING HORMONE: FSH: 151.9 m[IU]/mL — ABNORMAL HIGH

## 2014-11-29 LAB — ESTRADIOL, ULTRA SENS: Estradiol, Ultra Sensitive: 5 pg/mL

## 2014-12-09 ENCOUNTER — Ambulatory Visit (HOSPITAL_BASED_OUTPATIENT_CLINIC_OR_DEPARTMENT_OTHER): Payer: BLUE CROSS/BLUE SHIELD

## 2014-12-09 ENCOUNTER — Ambulatory Visit (HOSPITAL_BASED_OUTPATIENT_CLINIC_OR_DEPARTMENT_OTHER): Payer: BLUE CROSS/BLUE SHIELD | Admitting: Oncology

## 2014-12-09 ENCOUNTER — Telehealth: Payer: Self-pay | Admitting: Oncology

## 2014-12-09 VITALS — BP 93/60 | HR 81 | Temp 98.1°F | Resp 18 | Ht 63.0 in | Wt 118.5 lb

## 2014-12-09 DIAGNOSIS — C50311 Malignant neoplasm of lower-inner quadrant of right female breast: Secondary | ICD-10-CM

## 2014-12-09 DIAGNOSIS — C50911 Malignant neoplasm of unspecified site of right female breast: Secondary | ICD-10-CM

## 2014-12-09 DIAGNOSIS — M791 Myalgia: Secondary | ICD-10-CM | POA: Diagnosis not present

## 2014-12-09 DIAGNOSIS — Z170 Estrogen receptor positive status [ER+]: Secondary | ICD-10-CM | POA: Diagnosis not present

## 2014-12-09 LAB — URINALYSIS, MICROSCOPIC - CHCC
Bilirubin (Urine): NEGATIVE
Blood: NEGATIVE
Glucose: NEGATIVE mg/dL
Ketones: NEGATIVE mg/dL
Leukocyte Esterase: NEGATIVE
Nitrite: NEGATIVE
Protein: NEGATIVE mg/dL
Specific Gravity, Urine: 1.015 (ref 1.003–1.035)
Urobilinogen, UR: 0.2 mg/dL (ref 0.2–1)
pH: 6.5 (ref 4.6–8.0)

## 2014-12-09 MED ORDER — DOXYCYCLINE HYCLATE 100 MG PO TABS: 100.0000 mg | ORAL_TABLET | Freq: Two times a day (BID) | ORAL | Status: DC

## 2014-12-09 NOTE — Telephone Encounter (Signed)
Gave avs & calendar for December. °

## 2014-12-09 NOTE — Progress Notes (Signed)
ID: [name]   DOB: Aug 07, 1970  MR#: [id number]  PTW#:[id number]   PCP: Chauncey Cruel, MD GYN: Baltazar Najjar MD SU: Fanny Skates MD OTHER MD: Theodoro Kos, [name], [name]  CHIEF COMPLAINT:  Recurrent Breast Cancer, Right  CURRENT THERAPY: (Exemestane)   BREAST CANCER HISTORY:  From the original intake note:  "[name] (goes by "[name]") is a [place] woman who, at the age of 44, was referred by Dr. Baltazar Najjar for treatment of right breast carcinoma.   1.  The patient palpated a mass in her right breast which she brought to Dr. Jacelyn Grip attention. A prior screening mammogram on 02/06/2010 had been unremarkable. The patient was then scheduled for bilateral diagnostic mammogram and right breast ultrasonography on 12/22/2010, the studies demonstrating an ill defined mass with heterogeneous calcifications in the inner lower right breast, measuring 1.7 cm. The breasts are heterogeneously dense, but there were no other mammographic abnormalities in either breast. Clinically, there was a palpable firm mass at the 5:00 position of the right breast, 5 cm from the nipple. There were no palpable abnormalities in the right axilla. Ultrasound confirmed a 1.7 cm irregular, hypoechoic mass in the same position. There were some lymph nodes noted in the right exam of, one measuring 1.6 cm with a minimally thickened cortex inferiorly.  An ultrasound-guided core biopsy on 12/23/2010 [phone]) showed an invasive ductal carcinoma, grade 2, ER positive at 88%, PR positive at 11 is %, HER-2/neu positive with a FISH ratio of 4.61, and an MIB-1 of 36%.  The patient was referred to Dr. Fanny Skates and bilateral breast MRIs were obtained 12/30/2010. This showed the mass in question in the right breast to measure 2.0 cm. In the right axilla there was a 1.8 cm lymph node with a thickened cortex. The left breast was unremarkable.  Her subsequent treatment is as detailed  below.  2.   [name] then had a restaging PET scan late December 2014 which was positive only for a small subcutaneous nodule in the right breast lower outer quadrant. There was no other finding of concern. Ultrasound of both breasts 04/08/2013 showed a nodule in the right breast at the 4:00 position measuring 0.7 cm. The right axilla was clear. There was an area at the 7:00 position of the left breast which was of concern by palpation but showed only normal tissue.   Biopsy of the right breast mass 04/09/2013 showed an invasive ductal carcinoma, grade 2, estrogen receptor 94% positive, progesterone receptor 11% positive, with an MIB-1 of 19% and HER-2 amplified with a signals ratio of 3.75 and the number per cell of 4.50. The patient discussed the situation with with her surgeon's and on 05/05/2013 proceeded to right lumpectomy, with the final pathology (S. is a [id number]) confirming an invasive ductal carcinoma, 0.7 cm, grade 2, within a millimeter of the posterior margin, which showed skeletal muscle. A port was placed at the time of that procedure."  Her subsequent treatment is as detailed below.  INTERVAL HISTORY:  Jazmen Lindenbaum returns today for follow up of her recurrent right breast cancer. She started letrozole in March. When we saw her in June her estradiol level had risen a bit to 41. This made her think that she was no longer menopausal and therefore aromatase inhibitors would not work. Accordingly she stopped taking letrozole. A stronger reason for her stopping it however was this significant arthralgia and myalgia problem she was experiencing. She describes this as a severe fibromyalgia-like  syndrome. After stopping the medication those symptoms gradually improved so that now it is rare for her to feel achy or very tired.  Also she lost her job in April 2016, after 22 years. She now has no insurance (her husband is a Development worker, community and self-employed; other than through [employer] care. This does not cover  dental or ocular issues. This adds to her stress. She tells me it is hard for her to concentrate and her brain is foggy. There is also the fact that she is still trying to settle her mother's estate and there is surely an element of grief involved in that. All of this adds to her stress  REVIEW OF SYSTEMS:  [name] does still have some aches and pains. Many of them localized to the right shoulder right breast and right lower neck. Within that triangle certain positions or movements can be moderately uncomfortable. She can have some soreness there. Rarely she has shooting pains. She worries about that. There are also pains in the lower rib cage bilaterally. This is likely not postoperative as the pain is just described probably are, but related to activity. She is doing some yard work and other housework that might cause that problem. Aside from that she does have some fibromyalgia-like symptoms at times, but these are now uncommon. She tells me her hot flashes are better. She has lost a little weight because she is more active. (She lost her job so she is doing more around the house and in the art). Her mood she said initially was flat and attached but later she was tearful during today's visit. She has moderate insomnia problems a detailed review of systems today was otherwise stable   PAST MEDICAL HISTORY: Past Medical History  Diagnosis Date  . Migraines   . Asthma     as a child  . PONV (postoperative nausea and vomiting)   . Rash     neck  . Anxiety   . Pneumonia   . Kidney stones   . GERD (gastroesophageal reflux disease)     had during chemo  . Cervical dysplasia   . Breast cancer Sept 2012    right breast, inner lower right  . Hx of radiation therapy 11/24/13- 01/08/14    reconstructed right breast/chest wall 4500 cGy 25 sessions, electron beam boost 1440 cGy 8 sessions  1. History of prior adenoidectomy. 2. History of rhinoplasty.   3. History of multiple uterine cervical  treatments for precancerous lesions, none for the past 10 years or so. 4. History of fibroid tumor surgery. 5. History of D and C. 6. History of dysplastic nevi removed by Dr. Ronnald Ramp. 7. History of right "lazy eye" corrective surgery. 8. History of approximately 10-pack-years of tobacco abuse, resolved.  9. History of possible migraines (morning headaches). History of asthma as a child.  PAST SURGICAL HISTORY: Past Surgical History  Procedure Laterality Date  . Rhinoplasty    . Adenoidectomy    . Fibroid tumor surgery    . Dilation and curettage of uterus    . Uterine cervical treatments  2002    for precancerous lesions  . Lazy eye      corrective surgery  . Mastectomy w/ sentinel node biopsy  06/29/2011    Procedure: MASTECTOMY WITH SENTINEL LYMPH NODE BIOPSY;  Surgeon: Adin Hector, MD;  Location: [facility];  Service: General;  Laterality: Right;  right skin spairing mstectomy and right sentinel lymph node biopsy  . Breast reconstruction  06/29/2011  Procedure: BREAST RECONSTRUCTION;  Surgeon: Theodoro Kos, DO;  Location: [facility];  Service: Plastics;  Laterality: Bilateral;  Immediate Bilateral Breast Reconstruction with Bilateral Tissue Expanders and placement of  Alloderm   . Port-a-cath removal  03/12/2012    Procedure: MINOR REMOVAL PORT-A-CATH;  Surgeon: Adin Hector, MD;  Location: [facility];  Service: General;  Laterality: N/A;  Upper left  . Breast lumpectomy with needle localization Right 05/05/2013    Procedure: EXCISION RECURRENT CANCER RIGHT BREAST WITH NEEDLE LOCALOZATION;  Surgeon: Adin Hector, MD;  Location: [facility];  Service: General;  Laterality: Right;  . Portacath placement Left 05/05/2013    Procedure: INSERTION PORT-A-CATH;  Surgeon: Adin Hector, MD;  Location: [facility];  Service: General;  Laterality: Left;    FAMILY HISTORY Family History  Problem Relation Age of Onset  . Adopted: Yes  . Heart attack Father   . Cancer Cousin 40     melanoma; mat first cousin, located on neck  . Cancer Maternal Aunt 60    stomach and ovarian as separate primaries  . Cancer Maternal Uncle 65    lung; smoker  . Cancer Maternal Grandmother 27    enodometrial  . Cancer Maternal Aunt 55    breast  . Cancer Maternal Uncle 63    prostate  :  The patient's biological father died at the age of 36 from myocardial infarction.  The patient's biological mother is alive at age 34.  The patient was adopted by her biological mother's parents.  She has 3 half-brothers.  No sisters.  One cousin on her mother's side died from melanoma at the age of 61.  There is other cancer history in the adopted side but these are not even half-brothers or sisters, they are children of her adopted parents who are really her grandparents so these would be uncles and aunts (1 lymph node cancer, 1 prostate cancer, 1 breast cancer at the age of 68).  GYNECOLOGIC HISTORY:   (Updated 05/22/2013) She had menarche age 27 or 63.  She is GX, P1, first pregnancy to term at age 67. She stopped having periods approximately March 2013.  Hormone levels in February 2015 show patient to be premenopausal, with an estradiol of 113 on 05/15/2013, and her periods indeed resumed February of 2015  SOCIAL HISTORY: (Updated 06/12/2013) She works in a Engineer, agricultural.  Her husband, Nikesha Kwasny owns a family business called [employer].  They have been married 5 years.  They have a son, Kellie Simmering, age 52. Romilda Joy also has a daughter, Ronnell Makarewicz, who lives in [place] and is 45 years old.  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  The patient is not a church attender.    ADVANCED DIRECTIVES: Not in place  HEALTH MAINTENANCE:  (Updated 06/12/2013) Social History  Substance Use Topics  . Smoking status: Former Smoker    Quit date: 07/20/1996  . Smokeless tobacco: Never Used  . Alcohol Use: No     Colonoscopy:  Never  PAP: Oct 2014/[facility]  Bone density: Oct 2014, mild osteopenia with a T  score of -1.1  Lipid panel: Not on file  Allergies  Allergen Reactions  . Tussionex Pennkinetic Er [Hydrocod Polst-Cpm Polst Er] Other (See Comments)    hallucinations  . Codeine Other (See Comments)    unkown  . Morphine Rash  . Morphine And Related Anxiety    anxiety  . Penicillin G Rash  . Penicillins Rash    Current  Outpatient Prescriptions  Medication Sig Dispense Refill  . anastrozole (ARIMIDEX) 1 MG tablet Take 1 tablet (1 mg total) by mouth daily. (Patient not taking: Reported on 10/05/2014) 90 tablet 4  . aspirin-acetaminophen-caffeine (EXCEDRIN MIGRAINE) 267-124-58 MG per tablet Take 2 tablets by mouth every 6 (six) hours as needed for headache.    . Biotin 5000 MCG CAPS Take 5,000 mcg by mouth daily.    . calcium & magnesium carbonates (MYLANTA) 311-232 MG per tablet Take 1 tablet by mouth daily.    [name] doxycycline (VIBRA-TABS) 100 MG tablet TAKE ONE TABLET BY MOUTH TWICE DAILY 30 tablet 0  . fluconazole (DIFLUCAN) 150 MG tablet Take 1 tablet (150 mg total) by mouth once. (Patient not taking: Reported on 10/05/2014) 1 tablet 2  . ibuprofen (ADVIL,MOTRIN) 800 MG tablet Take 1 tablet (800 mg total) by mouth every 8 (eight) hours as needed. (Patient not taking: Reported on 10/05/2014) 60 tablet 5  . letrozole (FEMARA) 2.5 MG tablet Take 1 tablet (2.5 mg total) by mouth daily. 90 tablet 4  . LORazepam (ATIVAN) 0.5 MG tablet TAKE ONE TABLET BY MOUTH TWICE DAILY AS NEEDED FOR ANXIETY (Patient not taking: Reported on 10/05/2014) 60 tablet 0  . prenatal vitamin w/FE, FA (PRENATAL 1 + 1) 27-1 MG TABS tablet Take 1 tablet by mouth daily. 90 each 4  . traMADol (ULTRAM) 50 MG tablet TAKE ONE TABLET BY MOUTH EVERY 6 HOURS AS NEEDED FOR PAIN 30 tablet 0   No current facility-administered medications for this visit.    OBJECTIVE: Middle-aged white woman who appears stated age 69 Vitals:   12/09/14 1542  BP: 93/60  Pulse: 81  Temp: 98.1 F (36.7 C)  Resp: 18     Body mass index is 21  kg/(m^2).    ECOG FS: 1 Filed Weights   12/09/14 1542  Weight: 118 lb 8 oz (53.751 kg)   Sclerae unicteric, EOMs intact Oropharynx clear, dentition in good repair No cervical or supraclavicular adenopathy Lungs no rales or rhonchi Heart regular rate and rhythm Abd soft, nontender, positive bowel sounds MSK no focal spinal tenderness, no upper extremity lymphedema, no focal tenderness over the rib cage or right shoulder Neuro: nonfocal, well oriented, appropriate affect Breasts: Status post bilateral mastectomies with reconstruction. There is no evidence of local recurrence. Both axillae are benign.  LAB RESULTS: Lab Results  Component Value Date   WBC 5.2 11/25/2014   NEUTROABS 3.3 11/25/2014   HGB 13.0 11/25/2014   HCT 39.4 11/25/2014   MCV 93.5 11/25/2014   PLT 237 11/25/2014      Chemistry      Component Value Date/Time   NA 140 11/25/2014 1514   NA 140 01/08/2014 1459   K 4.2 11/25/2014 1514   K 4.5 01/08/2014 1459   CL 103 01/08/2014 1459   CL 104 09/23/2012 1415   CO2 26 11/25/2014 1514   CO2 29 01/08/2014 1459   BUN 10.5 11/25/2014 1514   BUN 12 01/08/2014 1459   CREATININE 0.9 11/25/2014 1514   CREATININE 0.76 01/08/2014 1459   CREATININE 0.70 05/05/2013 1059      Component Value Date/Time   CALCIUM 9.5 11/25/2014 1514   CALCIUM 9.8 01/08/2014 1459   ALKPHOS 93 11/25/2014 1514   ALKPHOS 87 01/08/2014 1459   AST 25 11/25/2014 1514   AST 33 01/08/2014 1459   ALT 24 11/25/2014 1514   ALT 21 01/08/2014 1459   BILITOT 0.70 11/25/2014 1514   BILITOT 0.5 01/08/2014 1459  STUDIES: Recent films reviewed  ASSESSMENT: 44 y.o.  BRCA 1-2 negative [name], [place] woman,   (1) status post right breast biopsy in September 2012 for a clinically 2.0 cm invasive ductal carcinoma, with some enlarged Right axillary lymph nodes, the largest one of which was negative by biopsy. The tumor was grade 2, estrogen receptor positive at 88%, progesterone receptor  positive at 11%, HER-2/neu positive by CISH with a ratio of 4.61, and a proliferation marker of 36%.   (2) treated in the neoadjuvant setting with Q 3 week docetaxel/ carboplatin/ trastuzumab x6, completed 05/18/2011.  Continued on trastuzumab every 3 weeks to complete one year, last dose in November 2013.  (3) s/p bilateral mastectomies with sentinel node biopsy 06/29/2011 for a residual right-sided ypT1c ypN0 invasive ductal carcinoma, grade 2, with immediate expander placement  (4) on tamoxifen as of May 2013, discontinued in early 2015 due to breast cancer recurrence   (5) status post left lumpectomy with left-sided sentinel lymph node sampling 05/05/2013 for a pT1b pN0, stage IA invasive ductal carcinoma, estrogen receptor 94% positive, progesterone receptor 11% positive, with an MIB-1 of 19% and HER-2 amplification  (6)  Received TDM-1 every 3 weeks x 8 doses completed 10/09/2013; echo 08/25/2013 showed a well preserved ejection fraction  (7) adjuvant radiation therapy completed 01/08/2014  (8) to start anastrozole 04/10/2014   (a) Gora and estradiol in menopausal range 02/04/2014, to be followed every 3 months  (b) bone density 01/15/2013 showed a T score of -1.1  (c) switched to letrozole 07/07/2014, discontinued June 2016 because of arthralgias/myalgias  (9) genetic testing (23 genes associated with hereditary ovarian cancer through [facility], April 2015) 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   PLAN:  I spent approximately 50 minutes with Gabriel Cirri today going over her problems. She does have a variety of aches and pains. Many of them are postoperative. The will improve with stretching exercises and gentle cardia. This includes the discomfort in the triangle between her right neck right shoulder and right breast in particular.  Quite aside from that she has fibromyalgia-like aches and pains. These likely where greatly increased by her  letrozole and they have greatly diminished since she has been off the letrozole area did she tells me now she has one day every few weeks when she feels achy and tired, whereas before it was very frequent. Her hot flashes have also decreased.  A third problem is premature menopause. I gave her a copy of her Salvo and LH which show her to be clearly menopausal. The oat blip" in her estrogen level we noted in June has been reported. Approximately one out of every 10 or so estradiol levels in postmenopausal women will appear a little higher, for poorly explain reasons having to do with the questionable reliability of this assay. Menopause affects particularly her mood and she was tearful today "for no reason" as she puts it. It may also contribute to her insomnia, although nocturia seems to be the main reason for that  Finally she has "foggy brain" or "chemo brain" problem. This is certainly a partly due to her prior treatment and to medications but also to posttraumatic stress, depression, and menopause. There is also the extra stress of her having lost her job this year. What we did today was first of all reassure her and I gave her a copy of her PET scan from December 2015, which was entirely negative. Second we discussed antiestrogen senna feel  it is very important for her to be on some antiestrogen. The remaining antiestrogen available is exemestane. We discussed the possible toxicities, side effects and complications of that agent and I gave her a written prescription so she can "shop it".  As far as the "foggy brain" is concerned I wrote her a prescription for methylphenidate 5 mg to take with breakfast. She will let me know whether that works for her or not.  I'm going to see her again in 3 months.She knows to call for any problems that may develop before the next visit here.    Chauncey Cruel, MD    12/09/2014

## 2014-12-10 LAB — URINE CULTURE

## 2014-12-31 ENCOUNTER — Other Ambulatory Visit: Payer: Self-pay | Admitting: Oncology

## 2015-01-01 ENCOUNTER — Other Ambulatory Visit: Payer: Self-pay | Admitting: *Deleted

## 2015-01-14 ENCOUNTER — Ambulatory Visit: Payer: BC Managed Care – PPO | Admitting: Obstetrics

## 2015-02-15 ENCOUNTER — Other Ambulatory Visit: Payer: Self-pay | Admitting: Oncology

## 2015-02-18 ENCOUNTER — Telehealth: Payer: Self-pay

## 2015-02-18 NOTE — Telephone Encounter (Signed)
[facility] called in [facility] per patient's request.  Per Dr. Jana Hakim patient may have refill on tramadol.  Patient is aware.

## 2015-03-15 ENCOUNTER — Other Ambulatory Visit: Payer: Self-pay

## 2015-03-15 DIAGNOSIS — C50311 Malignant neoplasm of lower-inner quadrant of right female breast: Secondary | ICD-10-CM

## 2015-03-16 ENCOUNTER — Other Ambulatory Visit (HOSPITAL_BASED_OUTPATIENT_CLINIC_OR_DEPARTMENT_OTHER): Payer: BLUE CROSS/BLUE SHIELD

## 2015-03-16 DIAGNOSIS — C50311 Malignant neoplasm of lower-inner quadrant of right female breast: Secondary | ICD-10-CM

## 2015-03-16 LAB — CBC WITH DIFFERENTIAL/PLATELET
BASO%: 0.5 % (ref 0.0–2.0)
Basophils Absolute: 0 10*3/uL (ref 0.0–0.1)
EOS%: 2.1 % (ref 0.0–7.0)
Eosinophils Absolute: 0.1 10*3/uL (ref 0.0–0.5)
HCT: 42.2 % (ref 34.8–46.6)
HGB: 14 g/dL (ref 11.6–15.9)
LYMPH%: 26.3 % (ref 14.0–49.7)
MCH: 30.9 pg (ref 25.1–34.0)
MCHC: 33.2 g/dL (ref 31.5–36.0)
MCV: 93 fL (ref 79.5–101.0)
MONO#: 0.4 10*3/uL (ref 0.1–0.9)
MONO%: 6.6 % (ref 0.0–14.0)
NEUT#: 3.6 10*3/uL (ref 1.5–6.5)
NEUT%: 64.5 % (ref 38.4–76.8)
Platelets: 245 10*3/uL (ref 145–400)
RBC: 4.54 10*6/uL (ref 3.70–5.45)
RDW: 12.6 % (ref 11.2–14.5)
WBC: 5.6 10*3/uL (ref 3.9–10.3)
lymph#: 1.5 10*3/uL (ref 0.9–3.3)

## 2015-03-16 LAB — COMPREHENSIVE METABOLIC PANEL
ALT: 29 U/L (ref 0–55)
AST: 33 U/L (ref 5–34)
Albumin: 4.3 g/dL (ref 3.5–5.0)
Alkaline Phosphatase: 99 U/L (ref 40–150)
Anion Gap: 10 mEq/L (ref 3–11)
BUN: 12.7 mg/dL (ref 7.0–26.0)
CO2: 27 mEq/L (ref 22–29)
Calcium: 10.1 mg/dL (ref 8.4–10.4)
Chloride: 106 mEq/L (ref 98–109)
Creatinine: 0.8 mg/dL (ref 0.6–1.1)
EGFR: 84 mL/min/{1.73_m2} — ABNORMAL LOW (ref >90)
Glucose: 69 mg/dl — ABNORMAL LOW (ref 70–140)
Potassium: 4 mEq/L (ref 3.5–5.1)
Sodium: 144 mEq/L (ref 136–145)
Total Bilirubin: 0.56 mg/dL (ref 0.20–1.20)
Total Protein: 7.2 g/dL (ref 6.4–8.3)

## 2015-03-17 LAB — FOLLICLE STIMULATING HORMONE: FSH: 126.3 m[IU]/mL — ABNORMAL HIGH

## 2015-03-17 LAB — ESTRADIOL: Estradiol: 32.4 pg/mL

## 2015-03-30 ENCOUNTER — Ambulatory Visit (HOSPITAL_BASED_OUTPATIENT_CLINIC_OR_DEPARTMENT_OTHER): Payer: BLUE CROSS/BLUE SHIELD | Admitting: Oncology

## 2015-03-30 ENCOUNTER — Telehealth: Payer: Self-pay | Admitting: Oncology

## 2015-03-30 VITALS — BP 92/64 | HR 72 | Temp 98.0°F | Resp 18 | Ht 63.0 in | Wt 120.4 lb

## 2015-03-30 DIAGNOSIS — N898 Other specified noninflammatory disorders of vagina: Secondary | ICD-10-CM | POA: Diagnosis not present

## 2015-03-30 DIAGNOSIS — C50311 Malignant neoplasm of lower-inner quadrant of right female breast: Secondary | ICD-10-CM

## 2015-03-30 DIAGNOSIS — C50911 Malignant neoplasm of unspecified site of right female breast: Secondary | ICD-10-CM

## 2015-03-30 DIAGNOSIS — Z79811 Long term (current) use of aromatase inhibitors: Secondary | ICD-10-CM

## 2015-03-30 DIAGNOSIS — M8580 Other specified disorders of bone density and structure, unspecified site: Secondary | ICD-10-CM

## 2015-03-30 MED ORDER — FLUCONAZOLE 100 MG PO TABS: 100.0000 mg | ORAL_TABLET | Freq: Every day | ORAL | Status: DC

## 2015-03-30 MED ORDER — CIPROFLOXACIN HCL 500 MG PO TABS: 500.0000 mg | ORAL_TABLET | Freq: Two times a day (BID) | ORAL | Status: DC

## 2015-03-30 MED ORDER — EXEMESTANE 25 MG PO TABS: 25.0000 mg | ORAL_TABLET | Freq: Every day | ORAL | Status: DC

## 2015-03-30 NOTE — Progress Notes (Signed)
ID: [name]   DOB: 12/16/1970  MR#: [id number]  NKN#:[id number]   PCP: Chauncey Cruel, MD GYN: Baltazar Najjar MD SU: Fanny Skates MD OTHER MD: Theodoro Kos, [name], [name]  CHIEF COMPLAINT:  Recurrent Breast Cancer, Right  CURRENT THERAPY: Exemestane   BREAST CANCER HISTORY:  From the original intake note:  "Palestine Mosco (goes by "Sharis Keeran") is a [place] woman who, at the age of 19, was referred by Dr. Baltazar Najjar for treatment of right breast carcinoma.   1.  The patient palpated a mass in her right breast which she brought to Dr. Jacelyn Grip attention. A prior screening mammogram on 02/06/2010 had been unremarkable. The patient was then scheduled for bilateral diagnostic mammogram and right breast ultrasonography on 12/22/2010, the studies demonstrating an ill defined mass with heterogeneous calcifications in the inner lower right breast, measuring 1.7 cm. The breasts are heterogeneously dense, but there were no other mammographic abnormalities in either breast. Clinically, there was a palpable firm mass at the 5:00 position of the right breast, 5 cm from the nipple. There were no palpable abnormalities in the right axilla. Ultrasound confirmed a 1.7 cm irregular, hypoechoic mass in the same position. There were some lymph nodes noted in the right exam of, one measuring 1.6 cm with a minimally thickened cortex inferiorly.  An ultrasound-guided core biopsy on 12/23/2010 [phone]) showed an invasive ductal carcinoma, grade 2, ER positive at 88%, PR positive at 11 is %, HER-2/neu positive with a FISH ratio of 4.61, and an MIB-1 of 36%.  The patient was referred to Dr. Fanny Skates and bilateral breast MRIs were obtained 12/30/2010. This showed the mass in question in the right breast to measure 2.0 cm. In the right axilla there was a 1.8 cm lymph node with a thickened cortex. The left breast was unremarkable.  Her subsequent treatment is as detailed  below.  2.   Rhetta then had a restaging PET scan late December 2014 which was positive only for a small subcutaneous nodule in the right breast lower outer quadrant. There was no other finding of concern. Ultrasound of both breasts 04/08/2013 showed a nodule in the right breast at the 4:00 position measuring 0.7 cm. The right axilla was clear. There was an area at the 7:00 position of the left breast which was of concern by palpation but showed only normal tissue.   Biopsy of the right breast mass 04/09/2013 showed an invasive ductal carcinoma, grade 2, estrogen receptor 94% positive, progesterone receptor 11% positive, with an MIB-1 of 19% and HER-2 amplified with a signals ratio of 3.75 and the number per cell of 4.50. The patient discussed the situation with with her surgeon's and on 05/05/2013 proceeded to right lumpectomy, with the final pathology (S. is a [id number]) confirming an invasive ductal carcinoma, 0.7 cm, grade 2, within a millimeter of the posterior margin, which showed skeletal muscle. A port was placed at the time of that procedure."  Her subsequent treatment is as detailed below.  INTERVAL HISTORY:  Denisse Whitenack returns today for follow up of her estrogen receptor positive breast cancer. She continues on exemestane, and is tolerating it moderately well. She has problems with insomnia, waking up and about 4 in the morning most days, finally getting out of bed around 6:30. Her stepdaughter had twins recently. They live nearby, and Shiquita Collignon helps a lot with them. She also does the housework of course. Recall that she lost her job in April. She does complain  of aches and pains and increasing fatigue. The pain is chiefly in her knees and back and more the right knee than the left. She did have some bruising of the left knee and she was not sure what the cause of that was. She obtains the exemestane at $20 for a three-month supply.   REVIEW OF SYSTEMS:  Darlen Gledhill is not exercising regularly  at this point aside from her usual daily activities. A detailed review of systems today was otherwise stable.  PAST MEDICAL HISTORY: Past Medical History  Diagnosis Date  . Migraines   . Asthma     as a child  . PONV (postoperative nausea and vomiting)   . Rash     neck  . Anxiety   . Pneumonia   . Kidney stones   . GERD (gastroesophageal reflux disease)     had during chemo  . Cervical dysplasia   . Breast cancer Sept 2012    right breast, inner lower right  . Hx of radiation therapy 11/24/13- 01/08/14    reconstructed right breast/chest wall 4500 cGy 25 sessions, electron beam boost 1440 cGy 8 sessions  1. History of prior adenoidectomy. 2. History of rhinoplasty.   3. History of multiple uterine cervical treatments for precancerous lesions, none for the past 10 years or so. 4. History of fibroid tumor surgery. 5. History of D and C. 6. History of dysplastic nevi removed by Dr. Ronnald Ramp. 7. History of right "lazy eye" corrective surgery. 8. History of approximately 10-pack-years of tobacco abuse, resolved.  9. History of possible migraines (morning headaches). History of asthma as a child.  PAST SURGICAL HISTORY: Past Surgical History  Procedure Laterality Date  . Rhinoplasty    . Adenoidectomy    . Fibroid tumor surgery    . Dilation and curettage of uterus    . Uterine cervical treatments  2002    for precancerous lesions  . Lazy eye      corrective surgery  . Mastectomy w/ sentinel node biopsy  06/29/2011    Procedure: MASTECTOMY WITH SENTINEL LYMPH NODE BIOPSY;  Surgeon: Adin Hector, MD;  Location: [facility];  Service: General;  Laterality: Right;  right skin spairing mstectomy and right sentinel lymph node biopsy  . Breast reconstruction  06/29/2011    Procedure: BREAST RECONSTRUCTION;  Surgeon: Theodoro Kos, DO;  Location: [facility];  Service: Plastics;  Laterality: Bilateral;  Immediate Bilateral Breast Reconstruction with Bilateral Tissue Expanders and placement of   Alloderm   . Port-a-cath removal  03/12/2012    Procedure: MINOR REMOVAL PORT-A-CATH;  Surgeon: Adin Hector, MD;  Location: [facility];  Service: General;  Laterality: N/A;  Upper left  . Breast lumpectomy with needle localization Right 05/05/2013    Procedure: EXCISION RECURRENT CANCER RIGHT BREAST WITH NEEDLE LOCALOZATION;  Surgeon: Adin Hector, MD;  Location: [facility];  Service: General;  Laterality: Right;  . Portacath placement Left 05/05/2013    Procedure: INSERTION PORT-A-CATH;  Surgeon: Adin Hector, MD;  Location: [facility];  Service: General;  Laterality: Left;    FAMILY HISTORY Family History  Problem Relation Age of Onset  . Adopted: Yes  . Heart attack Father   . Cancer Cousin 40    melanoma; mat first cousin, located on neck  . Cancer Maternal Aunt 60    stomach and ovarian as separate primaries  . Cancer Maternal Uncle 65    lung; smoker  . Cancer Maternal Grandmother 47  enodometrial  . Cancer Maternal Aunt 55    breast  . Cancer Maternal Uncle 63    prostate  :  The patient's biological father died at the age of 92 from myocardial infarction.  The patient's biological mother is alive at age 44.  The patient was adopted by her biological mother's parents.  She has 3 half-brothers.  No sisters.  One cousin on her mother's side died from melanoma at the age of 60.  There is other cancer history in the adopted side but these are not even half-brothers or sisters, they are children of her adopted parents who are really her grandparents so these would be uncles and aunts (1 lymph node cancer, 1 prostate cancer, 1 breast cancer at the age of 90).  GYNECOLOGIC HISTORY:   (Updated 05/22/2013) She had menarche age 33 or 64.  She is GX, P1, first pregnancy to term at age 60. She stopped having periods approximately March 2013.  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  Her husband, Antoninette Lerner owns a family business called [employer].  They have a son, Kellie Simmering, age 63. Romilda Joy also has a daughter, Melenda Bielak, who lives in [place] and had twins in 2016.  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  The patient is not a church attender.    ADVANCED DIRECTIVES: Not in place  HEALTH MAINTENANCE:  (Updated 06/12/2013) Social History  Substance Use Topics  . Smoking status: Former Smoker    Quit date: 07/20/1996  . Smokeless tobacco: Never Used  . Alcohol Use: No     Colonoscopy:  Never  PAP: Oct 2014/[facility]  Bone density: Oct 2014, mild osteopenia with a T score of -1.1  Lipid panel: Not on file  Allergies  Allergen Reactions  . Tussionex Pennkinetic Er [Hydrocod Polst-Cpm Polst Er] Other (See Comments)    hallucinations  . Codeine Other (See Comments)    unkown  . Morphine Rash  . Morphine And Related Anxiety    anxiety  . Penicillin G Rash  . Penicillins Rash    Current Outpatient Prescriptions  Medication Sig Dispense Refill  . aspirin-acetaminophen-caffeine (EXCEDRIN MIGRAINE) 250-250-65 MG per tablet Take 2 tablets by mouth every 6 (six) hours as needed for headache.    . Biotin 5000 MCG CAPS Take 5,000 mcg by mouth daily.    . calcium & magnesium carbonates (MYLANTA) 311-232 MG per tablet Take 1 tablet by mouth daily.    [name] doxycycline (VIBRA-TABS) 100 MG tablet Take 1 tablet (100 mg total) by mouth 2 (two) times daily. 30 tablet 0  . exemestane (AROMASIN) 25 MG tablet Take 1 tablet (25 mg total) by mouth daily after breakfast.    . methylphenidate (RITALIN) 5 MG tablet Take 1 tablet (5 mg total) by mouth daily with breakfast.  0  . prenatal vitamin w/FE, FA (PRENATAL 1 + 1) 27-1 MG TABS tablet Take 1 tablet by mouth daily. 90 each 4  . traMADol (ULTRAM) 50 MG tablet TAKE ONE TABLET BY MOUTH EVERY 6  HOURS AS NEEDED FOR PAIN 30 tablet 0   No current facility-administered medications for this visit.    OBJECTIVE: Middle-aged white woman in no acute distress Filed Vitals:   03/30/15 1459  BP: 92/64  Pulse: 72  Temp:  98 F (36.7 C)  Resp: 18     Body mass index is 21.33 kg/(m^2).    ECOG FS: 1 Filed Weights   03/30/15 1459  Weight: 120 lb 6.4 oz (54.613 kg)   Sclerae unicteric, pupils round and equal Oropharynx clear and moist-- no thrush or other lesions No cervical or supraclavicular adenopathy Lungs no rales or rhonchi Heart regular rate and rhythm Abd soft, nontender, positive bowel sounds MSK no focal spinal tenderness, no upper extremity lymphedema Neuro: nonfocal, well oriented, appropriate affect Breasts: Status post bilateral mastectomies, with reconstructions in place. There is no evidence of chest wall recurrence. Both axillae are benign.   LAB RESULTS: Lab Results  Component Value Date   WBC 5.6 03/16/2015   NEUTROABS 3.6 03/16/2015   HGB 14.0 03/16/2015   HCT 42.2 03/16/2015   MCV 93.0 03/16/2015   PLT 245 03/16/2015      Chemistry      Component Value Date/Time   NA 144 03/16/2015 1423   NA 140 01/08/2014 1459   K 4.0 03/16/2015 1423   K 4.5 01/08/2014 1459   CL 103 01/08/2014 1459   CL 104 09/23/2012 1415   CO2 27 03/16/2015 1423   CO2 29 01/08/2014 1459   BUN 12.7 03/16/2015 1423   BUN 12 01/08/2014 1459   CREATININE 0.8 03/16/2015 1423   CREATININE 0.76 01/08/2014 1459   CREATININE 0.70 05/05/2013 1059      Component Value Date/Time   CALCIUM 10.1 03/16/2015 1423   CALCIUM 9.8 01/08/2014 1459   ALKPHOS 99 03/16/2015 1423   ALKPHOS 87 01/08/2014 1459   AST 33 03/16/2015 1423   AST 33 01/08/2014 1459   ALT 29 03/16/2015 1423   ALT 21 01/08/2014 1459   BILITOT 0.56 03/16/2015 1423   BILITOT 0.5 01/08/2014 1459      STUDIES: No results found.   ASSESSMENT: 44 y.o.  BRCA 1-2 negative [name], [place] woman,   (1) status post  right breast biopsy in September 2012 for a clinically 2.0 cm invasive ductal carcinoma, with some enlarged Right axillary lymph nodes, the largest one of which was negative by biopsy. The tumor was grade 2, estrogen receptor positive at 88%, progesterone receptor positive at 11%, HER-2/neu positive by CISH with a ratio of 4.61, and a proliferation marker of 36%.   (2) treated in the neoadjuvant setting with Q 3 week docetaxel/ carboplatin/ trastuzumab x6, completed 05/18/2011.  Continued on trastuzumab every 3 weeks to complete one year, last dose in November 2013.  (3) s/p bilateral mastectomies with sentinel node biopsy 06/29/2011 for a residual right-sided ypT1c ypN0 invasive ductal carcinoma, grade 2, with immediate expander placement  (4) on tamoxifen as of May 2013, discontinued in early 2015 due to breast cancer recurrence   (5) status post left lumpectomy with left-sided sentinel lymph node sampling 05/05/2013 for a pT1b pN0, stage IA invasive ductal carcinoma, estrogen receptor 94% positive, progesterone receptor 11% positive, with an MIB-1 of 19% and HER-2 amplification  (6)  Received TDM-1 every 3 weeks x 8 doses completed 10/09/2013; echo 08/25/2013 showed a well preserved ejection fraction  (7) adjuvant radiation therapy completed 01/08/2014  (8) started anastrozole 04/10/2014   (a) [place] and estradiol in menopausal range 02/04/2014, to be followed every 3 months  (b) bone density 01/15/2013 showed a T score of -1.1  (c) switched to letrozole 07/07/2014, discontinued June 2016 because of arthralgias/myalgias  (d) started exemestane September 2016  (9) genetic testing (23 genes associated with hereditary  ovarian cancer through [facility], April 2015) 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   PLAN:  Peniel is tolerating the exemestane moderately well. Aromatase inhibitors are particularly difficult in my experience for younger women.  Nevertheless she is willing to continue on it and hopefully with more time some of the malaise she is feeling and perhaps some of the mild arthralgias as well will improve.  We discussed her estradiol levels, which have been jumping around a little, but also her [place], which is very convincing. If you ask her pituitary it has no doubts that all that she is not making estrogen.  The vaginal dryness is not likely to improve on its own. I gave her information on our intimacy and pelvic health program. I also think she could be a little bit more active. She is busy at home and with the new grandchildren, but I think if she did range of motion exercises in a more formalized way as she would feel better overall. Accordingly I gave her information on the [employer] program. She is having a small vaginal discharge. She has been treated for vaginosis in the past. She is concerned that metronidazole has been found to cause cancer and wraps. I gave her a prescription for 3 days of Cipro and 3 days of Diflucan to be taken jointly. If that does not clear her symptoms she will let me know.  She is due for a bone density I have put that order in. We also discussed the pros and cons of having a PET scan at this time and decided to put it off until next year.  She will see me again in 4 months. She knows to call for any problems that may develop before her next visit here.  Chauncey Cruel, MD    03/30/2015

## 2015-03-30 NOTE — Telephone Encounter (Signed)
Appointments made and avs printed for patient °

## 2015-04-21 ENCOUNTER — Telehealth: Payer: Self-pay | Admitting: Oncology

## 2015-04-21 NOTE — Telephone Encounter (Signed)
Patient has called in to reschedule her appointment with dr gm  We[facility]

## 2015-04-30 ENCOUNTER — Other Ambulatory Visit: Payer: Self-pay | Admitting: Oncology

## 2015-05-04 ENCOUNTER — Inpatient Hospital Stay: Admission: RE | Admit: 2015-05-04 | Payer: BLUE CROSS/BLUE SHIELD | Source: Ambulatory Visit

## 2015-05-27 ENCOUNTER — Other Ambulatory Visit: Payer: BLUE CROSS/BLUE SHIELD

## 2015-06-16 ENCOUNTER — Other Ambulatory Visit: Payer: BLUE CROSS/BLUE SHIELD

## 2015-06-17 ENCOUNTER — Ambulatory Visit
Admission: RE | Admit: 2015-06-17 | Discharge: 2015-06-17 | Disposition: A | Discharge status: Home / Self Care | Payer: BLUE CROSS/BLUE SHIELD | Source: Ambulatory Visit | Attending: Oncology | Admitting: Oncology

## 2015-06-17 DIAGNOSIS — M8580 Other specified disorders of bone density and structure, unspecified site: Secondary | ICD-10-CM

## 2015-07-08 ENCOUNTER — Other Ambulatory Visit (HOSPITAL_BASED_OUTPATIENT_CLINIC_OR_DEPARTMENT_OTHER): Payer: BLUE CROSS/BLUE SHIELD

## 2015-07-08 ENCOUNTER — Other Ambulatory Visit: Payer: Self-pay | Admitting: *Deleted

## 2015-07-08 DIAGNOSIS — C50311 Malignant neoplasm of lower-inner quadrant of right female breast: Secondary | ICD-10-CM | POA: Diagnosis not present

## 2015-07-08 DIAGNOSIS — C50911 Malignant neoplasm of unspecified site of right female breast: Secondary | ICD-10-CM

## 2015-07-08 LAB — COMPREHENSIVE METABOLIC PANEL
ALT: 25 U/L (ref 0–55)
AST: 30 U/L (ref 5–34)
Albumin: 4.1 g/dL (ref 3.5–5.0)
Alkaline Phosphatase: 93 U/L (ref 40–150)
Anion Gap: 7 mEq/L (ref 3–11)
BUN: 11 mg/dL (ref 7.0–26.0)
CO2: 28 mEq/L (ref 22–29)
Calcium: 9.5 mg/dL (ref 8.4–10.4)
Chloride: 106 mEq/L (ref 98–109)
Creatinine: 0.8 mg/dL (ref 0.6–1.1)
EGFR: 87 mL/min/{1.73_m2} — ABNORMAL LOW (ref >90)
Glucose: 83 mg/dl (ref 70–140)
Potassium: 4.2 mEq/L (ref 3.5–5.1)
Sodium: 141 mEq/L (ref 136–145)
Total Bilirubin: 0.62 mg/dL (ref 0.20–1.20)
Total Protein: 6.7 g/dL (ref 6.4–8.3)

## 2015-07-08 LAB — CBC WITH DIFFERENTIAL/PLATELET
BASO%: 0.6 % (ref 0.0–2.0)
Basophils Absolute: 0 10*3/uL (ref 0.0–0.1)
EOS%: 2.8 % (ref 0.0–7.0)
Eosinophils Absolute: 0.1 10*3/uL (ref 0.0–0.5)
HCT: 42 % (ref 34.8–46.6)
HGB: 13.8 g/dL (ref 11.6–15.9)
LYMPH%: 26.8 % (ref 14.0–49.7)
MCH: 30.4 pg (ref 25.1–34.0)
MCHC: 32.9 g/dL (ref 31.5–36.0)
MCV: 92.3 fL (ref 79.5–101.0)
MONO#: 0.3 10*3/uL (ref 0.1–0.9)
MONO%: 6.5 % (ref 0.0–14.0)
NEUT#: 3.1 10*3/uL (ref 1.5–6.5)
NEUT%: 63.3 % (ref 38.4–76.8)
Platelets: 214 10*3/uL (ref 145–400)
RBC: 4.55 10*6/uL (ref 3.70–5.45)
RDW: 12.6 % (ref 11.2–14.5)
WBC: 4.8 10*3/uL (ref 3.9–10.3)
lymph#: 1.3 10*3/uL (ref 0.9–3.3)

## 2015-07-09 LAB — FOLLICLE STIMULATING HORMONE: FSH: 111 m[IU]/mL

## 2015-07-09 LAB — LUTEINIZING HORMONE: LH: 45 m[IU]/mL

## 2015-07-12 LAB — ESTRADIOL, ULTRA SENS: Estradiol, Sensitive: 27.4 pg/mL

## 2015-07-19 ENCOUNTER — Ambulatory Visit: Payer: BLUE CROSS/BLUE SHIELD | Admitting: Obstetrics

## 2015-07-22 ENCOUNTER — Ambulatory Visit: Payer: BLUE CROSS/BLUE SHIELD | Admitting: Oncology

## 2015-07-27 ENCOUNTER — Ambulatory Visit (INDEPENDENT_AMBULATORY_CARE_PROVIDER_SITE_OTHER): Payer: BLUE CROSS/BLUE SHIELD | Admitting: Obstetrics

## 2015-07-27 ENCOUNTER — Encounter: Payer: Self-pay | Admitting: Obstetrics

## 2015-07-27 ENCOUNTER — Ambulatory Visit (HOSPITAL_BASED_OUTPATIENT_CLINIC_OR_DEPARTMENT_OTHER): Payer: BLUE CROSS/BLUE SHIELD | Admitting: Oncology

## 2015-07-27 ENCOUNTER — Telehealth: Payer: Self-pay | Admitting: Oncology

## 2015-07-27 VITALS — BP 98/62 | HR 72 | Temp 98.5°F | Resp 18 | Ht 63.0 in | Wt 126.8 lb

## 2015-07-27 VITALS — BP 101/67 | HR 68 | Temp 98.5°F | Wt 125.8 lb

## 2015-07-27 DIAGNOSIS — C50911 Malignant neoplasm of unspecified site of right female breast: Secondary | ICD-10-CM

## 2015-07-27 DIAGNOSIS — B373 Candidiasis of vulva and vagina: Secondary | ICD-10-CM

## 2015-07-27 DIAGNOSIS — C50919 Malignant neoplasm of unspecified site of unspecified female breast: Secondary | ICD-10-CM

## 2015-07-27 DIAGNOSIS — C50311 Malignant neoplasm of lower-inner quadrant of right female breast: Secondary | ICD-10-CM

## 2015-07-27 DIAGNOSIS — R102 Pelvic and perineal pain: Secondary | ICD-10-CM

## 2015-07-27 DIAGNOSIS — M8580 Other specified disorders of bone density and structure, unspecified site: Secondary | ICD-10-CM

## 2015-07-27 DIAGNOSIS — Z01419 Encounter for gynecological examination (general) (routine) without abnormal findings: Secondary | ICD-10-CM

## 2015-07-27 DIAGNOSIS — R140 Abdominal distension (gaseous): Secondary | ICD-10-CM

## 2015-07-27 DIAGNOSIS — B3731 Acute candidiasis of vulva and vagina: Secondary | ICD-10-CM

## 2015-07-27 DIAGNOSIS — M545 Low back pain: Secondary | ICD-10-CM | POA: Diagnosis not present

## 2015-07-27 DIAGNOSIS — N946 Dysmenorrhea, unspecified: Secondary | ICD-10-CM

## 2015-07-27 MED ORDER — IBUPROFEN 800 MG PO TABS: 800.0000 mg | ORAL_TABLET | Freq: Three times a day (TID) | ORAL | Status: DC | PRN

## 2015-07-27 MED ORDER — FLUCONAZOLE 100 MG PO TABS: 100.0000 mg | ORAL_TABLET | Freq: Every day | ORAL | Status: DC

## 2015-07-27 MED ORDER — ANASTROZOLE 1 MG PO TABS: 1.0000 mg | ORAL_TABLET | Freq: Every day | ORAL | Status: DC

## 2015-07-27 NOTE — Telephone Encounter (Signed)
Gave patient avs report and appointments for August. [facility] will call re pet scan - patient aware.

## 2015-07-27 NOTE — Progress Notes (Signed)
ID: [name]   DOB: [birth year]  MR#: [id number]  CSN#:[id number]   PCP: Chauncey Cruel, MD GYN: Baltazar Najjar MD SU: Fanny Skates MD OTHER MD: Theodoro Kos, [name], [name]  CHIEF COMPLAINT:  Recurrent Breast Cancer, Right  CURRENT THERAPY: Aromatase inhibitors   BREAST CANCER HISTORY:  From the original intake note:  "[name] (goes by "[name]") is a [place] woman who, at the age of 79, was referred by Dr. Baltazar Najjar for treatment of right breast carcinoma.   1.  The patient palpated a mass in her right breast which she brought to Dr. Jacelyn Grip attention. A prior screening mammogram on 02/06/2010 had been unremarkable. The patient was then scheduled for bilateral diagnostic mammogram and right breast ultrasonography on 12/22/2010, the studies demonstrating an ill defined mass with heterogeneous calcifications in the inner lower right breast, measuring 1.7 cm. The breasts are heterogeneously dense, but there were no other mammographic abnormalities in either breast. Clinically, there was a palpable firm mass at the 5:00 position of the right breast, 5 cm from the nipple. There were no palpable abnormalities in the right axilla. Ultrasound confirmed a 1.7 cm irregular, hypoechoic mass in the same position. There were some lymph nodes noted in the right exam of, one measuring 1.6 cm with a minimally thickened cortex inferiorly.  An ultrasound-guided core biopsy on 12/23/2010 [phone]) showed an invasive ductal carcinoma, grade 2, ER positive at 88%, PR positive at 11 is %, HER-2/neu positive with a FISH ratio of 4.61, and an MIB-1 of 36%.  The patient was referred to Dr. Fanny Skates and bilateral breast MRIs were obtained 12/30/2010. This showed the mass in question in the right breast to measure 2.0 cm. In the right axilla there was a 1.8 cm lymph node with a thickened cortex. The left breast was unremarkable.  Her subsequent treatment is as  detailed below.  2.   [name] then had a restaging PET scan late December 2014 which was positive only for a small subcutaneous nodule in the right breast lower outer quadrant. There was no other finding of concern. Ultrasound of both breasts 04/08/2013 showed a nodule in the right breast at the 4:00 position measuring 0.7 cm. The right axilla was clear. There was an area at the 7:00 position of the left breast which was of concern by palpation but showed only normal tissue.   Biopsy of the right breast mass 04/09/2013 showed an invasive ductal carcinoma, grade 2, estrogen receptor 94% positive, progesterone receptor 11% positive, with an MIB-1 of 19% and HER-2 amplified with a signals ratio of 3.75 and the number per cell of 4.50. The patient discussed the situation with with her surgeon's and on 05/05/2013 proceeded to right lumpectomy, with the final pathology (S. is a [id number]) confirming an invasive ductal carcinoma, 0.7 cm, grade 2, within a millimeter of the posterior margin, which showed skeletal muscle. A port was placed at the time of that procedure."  Her subsequent treatment is as detailed below.  INTERVAL HISTORY:  [name] returns today for follow up of her recurrent breast cancer. She stopped her exemestane because it was about $45 a month. She wonders if she should go back to anastrozole. She did not notice any particular changes getting off that medication.  REVIEW OF SYSTEMS:  [name] complains of having some back pain, "like a knot" in the middle of her back. Its present even at rest. It doesn't exactly hurt it's just there. Of course  she is worried about her labs and her bone density results. Her neck and shoulders sometimes make popping noises like TMJ she says. She has occasional dull headaches. Her legs ache when she exercises and she is going to [employer] and likes it. She wonders if she should have a PET and a colonoscopy. Her stomach always feels bloated. She never feels  hungry; she only knows she needs to eat because her stomach growls.. She has a metallic taste particularly with sweets. She is tired and sleepy all the time although she does all her normal activities and she is currently participating in [place]. Her bowel movements are little bit yellow and a little bit soft. She doesn't go unless she takes something. She wonders if it is now safe for her to have unprotected sex. A detailed review of systems today was otherwise noncontributory  PAST MEDICAL HISTORY: Past Medical History  Diagnosis Date  . Migraines   . Asthma     as a child  . PONV (postoperative nausea and vomiting)   . Rash     neck  . Anxiety   . Pneumonia   . Kidney stones   . GERD (gastroesophageal reflux disease)     had during chemo  . Cervical dysplasia   . Breast cancer [facility]) Sept 2012    right breast, inner lower right  . Hx of radiation therapy 11/24/13- 01/08/14    reconstructed right breast/chest wall 4500 cGy 25 sessions, electron beam boost 1440 cGy 8 sessions  1. History of prior adenoidectomy. 2. History of rhinoplasty.   3. History of multiple uterine cervical treatments for precancerous lesions, none for the past 10 years or so. 4. History of fibroid tumor surgery. 5. History of D and C. 6. History of dysplastic nevi removed by Dr. Ronnald Ramp. 7. History of right "lazy eye" corrective surgery. 8. History of approximately 10-pack-years of tobacco abuse, resolved.  9. History of possible migraines (morning headaches). History of asthma as a child.  PAST SURGICAL HISTORY: Past Surgical History  Procedure Laterality Date  . Rhinoplasty    . Adenoidectomy    . Fibroid tumor surgery    . Dilation and curettage of uterus    . Uterine cervical treatments  2002    for precancerous lesions  . Lazy eye      corrective surgery  . Mastectomy w/ sentinel node biopsy  06/29/2011    Procedure: MASTECTOMY WITH SENTINEL LYMPH NODE BIOPSY;  Surgeon: Adin Hector, MD;   Location: [place];  Service: General;  Laterality: Right;  right skin spairing mstectomy and right sentinel lymph node biopsy  . Breast reconstruction  06/29/2011    Procedure: BREAST RECONSTRUCTION;  Surgeon: Theodoro Kos, DO;  Location: [place];  Service: Plastics;  Laterality: Bilateral;  Immediate Bilateral Breast Reconstruction with Bilateral Tissue Expanders and placement of  Alloderm   . Port-a-cath removal  03/12/2012    Procedure: MINOR REMOVAL PORT-A-CATH;  Surgeon: Adin Hector, MD;  Location: [place];  Service: General;  Laterality: N/A;  Upper left  . Breast lumpectomy with needle localization Right 05/05/2013    Procedure: EXCISION RECURRENT CANCER RIGHT BREAST WITH NEEDLE LOCALOZATION;  Surgeon: Adin Hector, MD;  Location: [place];  Service: General;  Laterality: Right;  . Portacath placement Left 05/05/2013    Procedure: INSERTION PORT-A-CATH;  Surgeon: Adin Hector, MD;  Location: [place];  Service: General;  Laterality: Left;    FAMILY HISTORY Family History  Problem Relation Age  of Onset  . Adopted: Yes  . Heart attack Father   . Cancer Cousin 40    melanoma; mat first cousin, located on neck  . Cancer Maternal Aunt 60    stomach and ovarian as separate primaries  . Cancer Maternal Uncle 65    lung; smoker  . Cancer Maternal Grandmother 7    enodometrial  . Cancer Maternal Aunt 55    breast  . Cancer Maternal Uncle 63    prostate  :  The patient's biological father died at the age of 69 from myocardial infarction.  The patient's biological mother is alive at age 5.  The patient was adopted by her biological mother's parents.  She has 3 half-brothers.  No sisters.  One cousin on her mother's side died from melanoma at the age of 28.  There is other cancer history in the adopted side but these are not even half-brothers or sisters, they are children of her adopted parents who are really her grandparents so these would be uncles and aunts (1 lymph  node cancer, 1 prostate cancer, 1 breast cancer at the age of 25).  GYNECOLOGIC HISTORY:   (Updated 05/22/2013) She had menarche age 47 or 34.  She is GX, P1, first pregnancy to term at age 53. She stopped having periods approximately March 2013.  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  Her husband, Teiara Baria owns a family business called [employer].  They have a son, Kellie Simmering, age 85. Romilda Joy also has a daughter, Anahli Arvanitis, who lives in [place] and had twins in 2016.  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  The patient is not a church attender.    ADVANCED DIRECTIVES: Not in place  HEALTH MAINTENANCE:  (Updated 06/12/2013) Social History  Substance Use Topics  . Smoking status: Former Smoker    Quit date: 07/20/1996  . Smokeless tobacco: Never Used  . Alcohol Use: No     Colonoscopy:  Never  PAP: Oct 2014/[facility]  Bone density: Oct 2014, mild osteopenia with a T score of -1.1  Lipid panel: Not on file  Allergies  Allergen Reactions  . Tussionex Pennkinetic Er [Hydrocod Polst-Cpm Polst Er] Other (See Comments)    hallucinations  . Codeine Other (See Comments)    unkown  . Morphine Rash  . Morphine And Related Anxiety    anxiety  . Penicillin G Rash  . Penicillins Rash    Current Outpatient Prescriptions  Medication Sig Dispense Refill  . aspirin-acetaminophen-caffeine (EXCEDRIN MIGRAINE) 250-250-65 MG per tablet Take 2 tablets by mouth every 6 (six) hours as needed for headache.    . Biotin 5000 MCG CAPS Take 5,000 mcg by mouth daily.    . calcium citrate-vitamin D (CITRACAL+D) 315-200 MG-UNIT tablet Take 1 tablet by mouth 2 (two) times daily.    . ciprofloxacin (CIPRO) 500 MG tablet Take 1 tablet (500 mg total) by mouth 2 (two) times daily.  (Patient not taking: Reported on 07/27/2015) 10 tablet 0  . exemestane (AROMASIN) 25 MG tablet Take 1 tablet (25 mg total) by mouth daily after breakfast. (Patient not taking: Reported on 07/27/2015) 90 tablet 4  . fluconazole (DIFLUCAN) 100 MG tablet Take 1 tablet (100 mg total) by mouth daily. 5 tablet 0  . ibuprofen (ADVIL,MOTRIN)  800 MG tablet Take 1 tablet (800 mg total) by mouth every 8 (eight) hours as needed. 30 tablet 5  . LORazepam (ATIVAN) 0.5 MG tablet TAKE ONE TABLET BY MOUTH TWICE DAILY AS NEEDED FOR ANXIETY 60 tablet 0  . prenatal vitamin w/FE, FA (PRENATAL 1 + 1) 27-1 MG TABS tablet Take 1 tablet by mouth daily. (Patient not taking: Reported on 07/27/2015) 90 each 4  . traMADol (ULTRAM) 50 MG tablet TAKE ONE TABLET BY MOUTH EVERY 6 HOURS AS NEEDED FOR PAIN 30 tablet 0   No current facility-administered medications for this visit.    OBJECTIVE: Middle-aged white woman Who appears well Filed Vitals:   07/27/15 1437  BP: 98/62  Pulse: 72  Temp: 98.5 F (36.9 C)  Resp: 18     Body mass index is 22.47 kg/(m^2).    ECOG FS: 1 Filed Weights   07/27/15 1437  Weight: 126 lb 12.8 oz (57.516 kg)   Sclerae unicteric, EOMs intact Oropharynx clear, dentition in good repair No cervical or supraclavicular adenopathy Lungs no rales or rhonchi Heart regular rate and rhythm Abd soft, nontender, positive bowel sounds MSK no focal spinal tenderness to vigorous percussion, no upper extremity lymphedema Neuro: nonfocal, well oriented, appropriate affect Breasts: Status post bilateral mastectomies. There is no evidence of local recurrence. Both axillae are benign.    LAB RESULTS: Lab Results  Component Value Date   WBC 4.8 07/08/2015   NEUTROABS 3.1 07/08/2015   HGB 13.8 07/08/2015   HCT 42.0 07/08/2015   MCV 92.3 07/08/2015   PLT 214 07/08/2015      Chemistry      Component Value Date/Time   NA 141 07/08/2015 1521   NA 140 01/08/2014 1459   K 4.2 07/08/2015 1521   K 4.5  01/08/2014 1459   CL 103 01/08/2014 1459   CL 104 09/23/2012 1415   CO2 28 07/08/2015 1521   CO2 29 01/08/2014 1459   BUN 11.0 07/08/2015 1521   BUN 12 01/08/2014 1459   CREATININE 0.8 07/08/2015 1521   CREATININE 0.76 01/08/2014 1459   CREATININE 0.70 05/05/2013 1059      Component Value Date/Time   CALCIUM 9.5 07/08/2015 1521   CALCIUM 9.8 01/08/2014 1459   ALKPHOS 93 07/08/2015 1521   ALKPHOS 87 01/08/2014 1459   AST 30 07/08/2015 1521   AST 33 01/08/2014 1459   ALT 25 07/08/2015 1521   ALT 21 01/08/2014 1459   BILITOT 0.62 07/08/2015 1521   BILITOT 0.5 01/08/2014 1459      STUDIES: No results found.   ASSESSMENT: 45 y.o.  BRCA 1-2 negative [name], [place] woman,   (1) status post right breast biopsy in September 2012 for a clinically 2.0 cm invasive ductal carcinoma, with some enlarged Right axillary lymph nodes, the largest one of which was negative by biopsy. The tumor was grade 2, estrogen receptor positive at 88%, progesterone receptor positive at 11%, HER-2/neu positive by CISH with a ratio of 4.61, and a proliferation marker of 36%.   (2) treated in the neoadjuvant setting with Q 3 week docetaxel/ carboplatin/ trastuzumab x6, completed 05/18/2011.  Continued on trastuzumab every 3 weeks to complete one year, last dose in November 2013.  (3) s/p bilateral mastectomies with sentinel node biopsy 06/29/2011 for a residual right-sided ypT1c ypN0 invasive ductal carcinoma, grade 2, with immediate expander placement  (4) on tamoxifen as of May 2013, discontinued in early 2015 due to breast cancer recurrence   (5) status post left lumpectomy  with left-sided sentinel lymph node sampling 05/05/2013 for a pT1b pN0, stage IA invasive ductal carcinoma, estrogen receptor 94% positive, progesterone receptor 11% positive, with an MIB-1 of 19% and HER-2 amplification  (6)  Received TDM-1 every 3 weeks x 8 doses completed 10/09/2013; echo 08/25/2013 showed a well preserved ejection  fraction  (7) adjuvant radiation therapy completed 01/08/2014  (8) started anastrozole 04/10/2014   (a) [place] and estradiol in menopausal range 02/04/2014, to be followed every 3 months  (b) bone density 01/15/2013 showed a T score of -1.1  (c) switched to letrozole 07/07/2014, discontinued June 2016 because of arthralgias/myalgias  (d) started exemestane September 2016, stopped March 2017 because of cost issues  (e) bone density 06/17/2015 showed a T score of -1.5   (f) anastrozole resumed April 2017  (9) genetic testing (23 genes associated with hereditary ovarian cancer through [facility], April 2015) 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   PLAN:  I spent approximately 50 minutes with [place] today. We discussed her bone density results in detail. She understands that her bones are little bit thinner and one option for her would be to consider bisphosphonates such as Fosamax or denosumab in the form of Prolia. We talked about the possible side effects toxicities and complications of these agents. At this point however she was unable to make up her mind whether she would like to proceed with that.  She is not sure whether the anastrozole or letrozole will be cheaper. I put in a prescription for anastrozole to her pharmacy and gave her a written prescription for letrozole so she can shop them. Understands she needs to be on anti-estrogens for another 3 years at least.  I was unable to palpate a knot on her back and percussion upper back was benign. I think this is going to be a benign problem but we have not imaged her back in over a year and I think it would be worthwhile to obtain a repeat PET scan sometime later this year to clarify that question.  I reassured her that the popping sounds in her shoulders and neck are arthritic. Her headaches are not intense or persistent than I think may be related to sinus. I discouraged her from pursuing a  colonoscopy at this point as there is no specific indication for this. Even though her stomach feels bloated to her her weight is actually a little bit down as compared to December 2015 and exam was benign. Again a PET scan can reassure her that nothing is going on in that area.  I have no explanation why chocolate now tastes a little bit metallic but chemotherapy may have permanently altered some of her taste buds. I strongly affirmed her continuing participation in [place]. Given that she is exercising vigorously, it is perhaps not surprising that she sometimes feels a bit tired. I am not sure why her bowel movements are slightly yellowish in tinge. She has no bilirubin or other liver associated lab work problems.  She wanted to know if at this point I felt it was safe for her to not need to use barrier contraceptives. My feeling is this is probably safe but she should discuss it with her gynecologist and I would feel safer if she had a copper IUD implanted.  She is going to return to see me in August. We will set her up for a PET scan prior to that visit. She knows to call for any other problems  that may develop before then.    Chauncey Cruel, MD    07/27/2015

## 2015-07-28 ENCOUNTER — Encounter: Payer: Self-pay | Admitting: Obstetrics

## 2015-07-28 NOTE — Progress Notes (Signed)
Subjective:        [name] is a 45 y.o. female here for a routine exam.  Current complaints: Bloating and occasional pelvic pain.  Does have H/O chronic constipation.   Personal health questionnaire:  Is patient Ashkenazi Jewish, have a family history of breast and/or ovarian cancer: no Is there a family history of uterine cancer diagnosed at age < 49, gastrointestinal cancer, urinary tract cancer, family member who is a Field seismologist syndrome-associated carrier: no Is the patient overweight and hypertensive, family history of diabetes, personal history of gestational diabetes, preeclampsia or PCOS: no Is patient over 73, have PCOS,  family history of premature CHD under age 63, diabetes, smoke, have hypertension or peripheral artery disease:  no At any time, has a partner hit, kicked or otherwise hurt or frightened you?: no Over the past 2 weeks, have you felt down, depressed or hopeless?: no Over the past 2 weeks, have you felt little interest or pleasure in doing things?:no   Gynecologic History No LMP recorded. Patient is not currently having periods (Reason: Chemotherapy). Contraception: none Last Pap: 2016. Results were: normal Last mammogram: 2012. Results were: abnormal  Obstetric History OB History  No data available    Past Medical History  Diagnosis Date  . Migraines   . Asthma     as a child  . PONV (postoperative nausea and vomiting)   . Rash     neck  . Anxiety   . Pneumonia   . Kidney stones   . GERD (gastroesophageal reflux disease)     had during chemo  . Cervical dysplasia   . Breast cancer [facility]) Sept 2012    right breast, inner lower right  . Hx of radiation therapy 11/24/13- 01/08/14    reconstructed right breast/chest wall 4500 cGy 25 sessions, electron beam boost 1440 cGy 8 sessions    Past Surgical History  Procedure Laterality Date  . Rhinoplasty    . Adenoidectomy    . Fibroid tumor surgery    . Dilation and curettage of uterus    .  Uterine cervical treatments  2002    for precancerous lesions  . Lazy eye      corrective surgery  . Mastectomy w/ sentinel node biopsy  06/29/2011    Procedure: MASTECTOMY WITH SENTINEL LYMPH NODE BIOPSY;  Surgeon: Adin Hector, MD;  Location: [place];  Service: General;  Laterality: Right;  right skin spairing mstectomy and right sentinel lymph node biopsy  . Breast reconstruction  06/29/2011    Procedure: BREAST RECONSTRUCTION;  Surgeon: Theodoro Kos, DO;  Location: [place];  Service: Plastics;  Laterality: Bilateral;  Immediate Bilateral Breast Reconstruction with Bilateral Tissue Expanders and placement of  Alloderm   . Port-a-cath removal  03/12/2012    Procedure: MINOR REMOVAL PORT-A-CATH;  Surgeon: Adin Hector, MD;  Location: [place];  Service: General;  Laterality: N/A;  Upper left  . Breast lumpectomy with needle localization Right 05/05/2013    Procedure: EXCISION RECURRENT CANCER RIGHT BREAST WITH NEEDLE LOCALOZATION;  Surgeon: Adin Hector, MD;  Location: [place];  Service: General;  Laterality: Right;  . Portacath placement Left 05/05/2013    Procedure: INSERTION PORT-A-CATH;  Surgeon: Adin Hector, MD;  Location: [place];  Service: General;  Laterality: Left;     Current outpatient prescriptions:  .  aspirin-acetaminophen-caffeine (EXCEDRIN MIGRAINE) [id number] MG per tablet, Take 2 tablets by mouth every 6 (six) hours as needed for headache., Disp: , Rfl:  .  Biotin 5000 MCG CAPS, Take 5,000 mcg by mouth daily., Disp: , Rfl:  .  calcium citrate-vitamin D (CITRACAL+D) 315-200 MG-UNIT tablet, Take 1 tablet by mouth 2 (two) times daily., Disp: , Rfl:  .  fluconazole (DIFLUCAN) 100 MG tablet, Take 1 tablet (100 mg total) by mouth daily., Disp: 5 tablet, Rfl: 0 .  LORazepam (ATIVAN) 0.5 MG tablet, TAKE ONE TABLET BY MOUTH TWICE DAILY AS NEEDED FOR ANXIETY, Disp: 60 tablet, Rfl: 0 .  traMADol (ULTRAM) 50 MG tablet, TAKE ONE TABLET BY MOUTH EVERY 6 HOURS AS  NEEDED FOR PAIN, Disp: 30 tablet, Rfl: 0 .  anastrozole (ARIMIDEX) 1 MG tablet, Take 1 tablet (1 mg total) by mouth daily., Disp: 90 tablet, Rfl: 4 .  ibuprofen (ADVIL,MOTRIN) 800 MG tablet, Take 1 tablet (800 mg total) by mouth every 8 (eight) hours as needed., Disp: 30 tablet, Rfl: 5 .  prenatal vitamin w/FE, FA (PRENATAL 1 + 1) 27-1 MG TABS tablet, Take 1 tablet by mouth daily. (Patient not taking: Reported on 07/27/2015), Disp: 90 each, Rfl: 4 Allergies  Allergen Reactions  . Tussionex Pennkinetic Er [Hydrocod Polst-Cpm Polst Er] Other (See Comments)    hallucinations  . Codeine Other (See Comments)    unkown  . Morphine Rash  . Morphine And Related Anxiety    anxiety  . Penicillin G Rash  . Penicillins Rash    Social History  Substance Use Topics  . Smoking status: Former Smoker    Quit date: 07/20/1996  . Smokeless tobacco: Never Used  . Alcohol Use: No    Family History  Problem Relation Age of Onset  . Adopted: Yes  . Heart attack Father   . Cancer Cousin 40    melanoma; mat first cousin, located on neck  . Cancer Maternal Aunt 60    stomach and ovarian as separate primaries  . Cancer Maternal Uncle 65    lung; smoker  . Cancer Maternal Grandmother 43    enodometrial  . Cancer Maternal Aunt 78    breast  . Cancer Maternal Uncle 63    prostate      Review of Systems  Constitutional: negative for fatigue and weight loss Respiratory: negative for cough and wheezing Cardiovascular: negative for chest pain, fatigue and palpitations Gastrointestinal: negative for abdominal pain and change in bowel habits Musculoskeletal:negative for myalgias Neurological: negative for gait problems and tremors Behavioral/Psych: negative for abusive relationship, depression Endocrine: negative for temperature intolerance   Genitourinary:negative for abnormal menstrual periods, genital lesions, hot flashes, sexual problems and vaginal discharge Integument/breast: negative for  breast lump, breast tenderness, nipple discharge and skin lesion(s)    Objective:       BP 101/67 mmHg  Pulse 68  Temp(Src) 98.5 F (36.9 C)  Wt 125 lb 12.8 oz (57.063 kg) General:   alert  Skin:   no rash or abnormalities  Lungs:   clear to auscultation bilaterally  Heart:   regular rate and rhythm, S1, S2 normal, no murmur, click, rub or gallop  Breasts:   normal without suspicious masses, skin or nipple changes or axillary nodes.  Breast reconstructive surgery scars present  Abdomen:  normal findings: no organomegaly, soft, non-tender and no hernia  Pelvis:  External genitalia: normal general appearance Urinary system: urethral meatus normal and bladder without fullness, nontender Vaginal: normal without tenderness, induration or masses Cervix: normal appearance Adnexa: normal bimanual exam Uterus: anteverted and non-tender, normal size   Lab Review Urine pregnancy test Labs reviewed yes Radiologic studies  reviewed yes    Assessment:    Healthy female exam.    H/O Breast CA.  Abdominal bloating and pelvic pain  Chronic constipation   Plan:   Ultrasound ordered May need GI referral for chronic constipation  Education reviewed: calcium supplements, depression evaluation, low fat, low cholesterol diet, self breast exams and weight bearing exercise. Follow up in: 2 weeks.   Meds ordered this encounter  Medications  . calcium citrate-vitamin D (CITRACAL+D) 315-200 MG-UNIT tablet    Sig: Take 1 tablet by mouth 2 (two) times daily.  . fluconazole (DIFLUCAN) 100 MG tablet    Sig: Take 1 tablet (100 mg total) by mouth daily.    Dispense:  5 tablet    Refill:  0  . ibuprofen (ADVIL,MOTRIN) 800 MG tablet    Sig: Take 1 tablet (800 mg total) by mouth every 8 (eight) hours as needed.    Dispense:  30 tablet    Refill:  5   Orders Placed This Encounter  Procedures  . US Transvaginal Non-OB    Standing Status: Future     Number of Occurrences:      Standing  Expiration Date: 09/25/2016    Order Specific Question:  Reason for Exam (SYMPTOM  OR DIAGNOSIS REQUIRED)    Answer:  bloating and pelvic pain    Order Specific Question:  Preferred imaging location?    Answer:  [facility]  . US Pelvis Complete    Standing Status: Future     Number of Occurrences:      Standing Expiration Date: 09/25/2016    Order Specific Question:  Reason for Exam (SYMPTOM  OR DIAGNOSIS REQUIRED)    Answer:  bloating and pelvic pain    Order Specific Question:  Preferred imaging location?    Answer:  [facility]  . TSH  . Hemoglobin A1c  . Vitamin D (25 hydroxy)  . Cholesterol, total  . HDL cholesterol  . Direct LDL  . NuSwab Vaginitis (VG)

## 2015-07-29 LAB — PAP IG AND HPV HIGH-RISK
HPV, high-risk: NEGATIVE
PAP Smear Comment: 0

## 2015-08-02 ENCOUNTER — Other Ambulatory Visit: Payer: Self-pay | Admitting: Obstetrics

## 2015-08-02 DIAGNOSIS — R140 Abdominal distension (gaseous): Secondary | ICD-10-CM

## 2015-08-02 DIAGNOSIS — R102 Pelvic and perineal pain: Secondary | ICD-10-CM

## 2015-08-02 IMAGING — US US BREAST BILAT
1 series · 5 of 5 positions shown · non-contrast
Comparison: Prior mammograms dated 12/23/2010, 12/22/2010 and
12/07/2009. PET CT dated 04/07/2013.

CLINICAL DATA: 42-year-old female with history of right breast
cancer. Status post bilateral mastectomies. Patient had a recent PET
CT on 04/07/2013 which showed a 7 mm nodule medially in the right
breast felt to be nonspecific. The patient also complains of a
palpable abnormality in the 7 o'clock region of the left breast.

EXAM:
ULTRASOUND OF THE BILATERAL BREAST

[Series 1: us breast bilat · 5 of 5 slices shown]
[im 1/5]
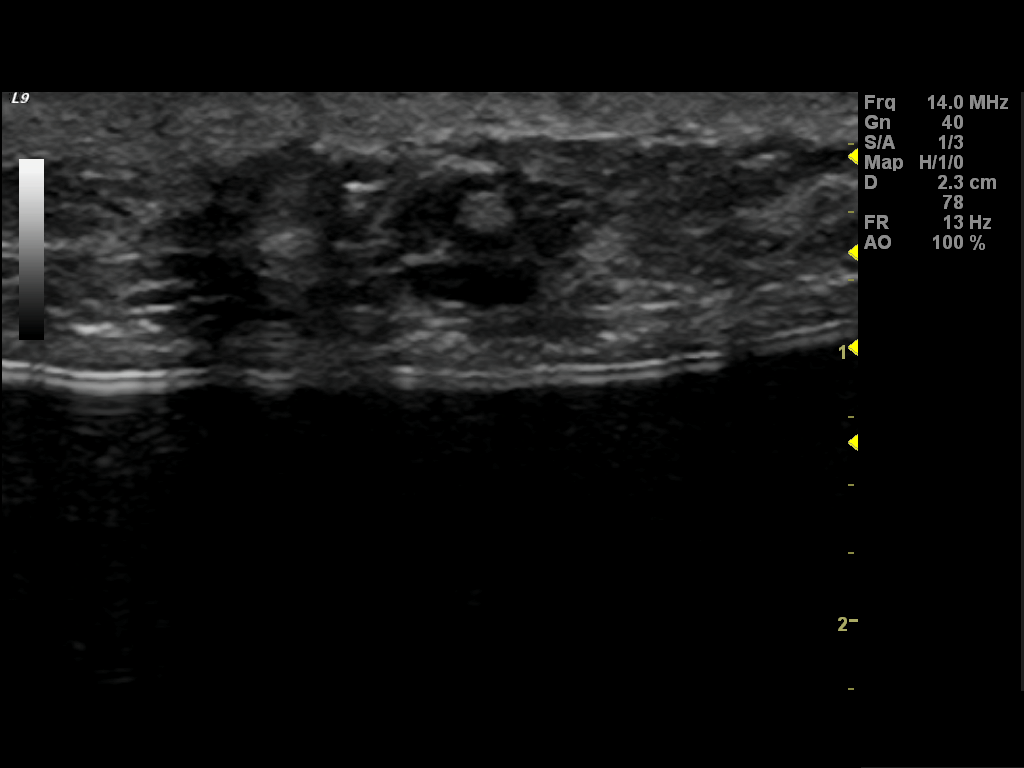
[im 2/5]
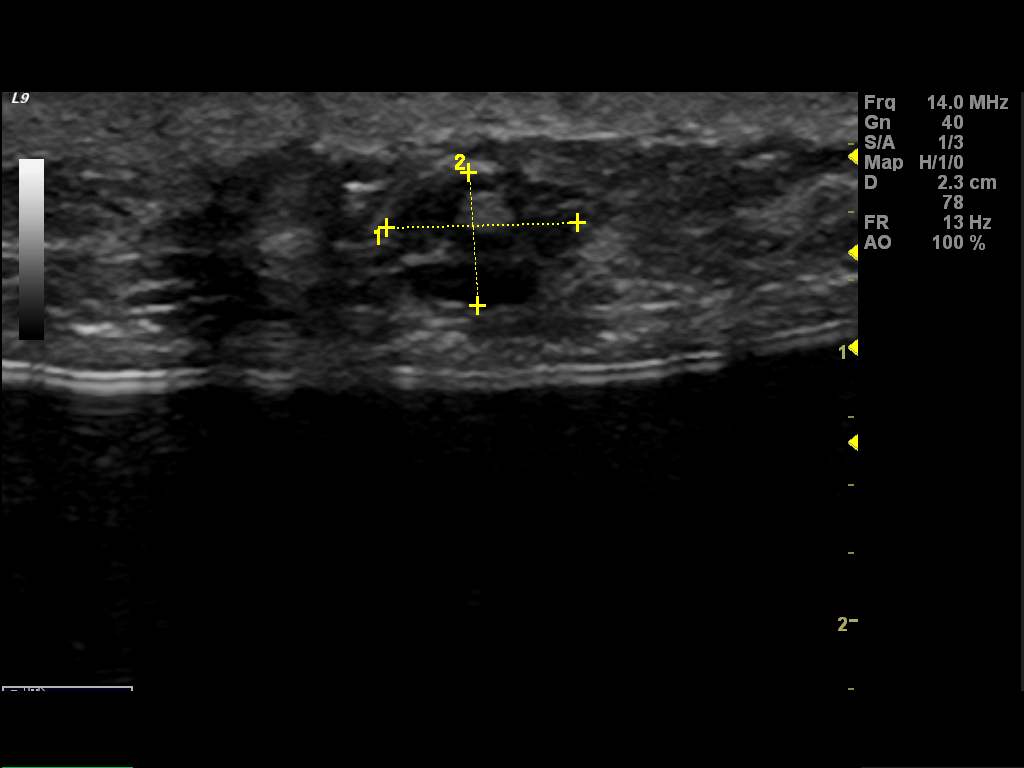
[im 3/5]
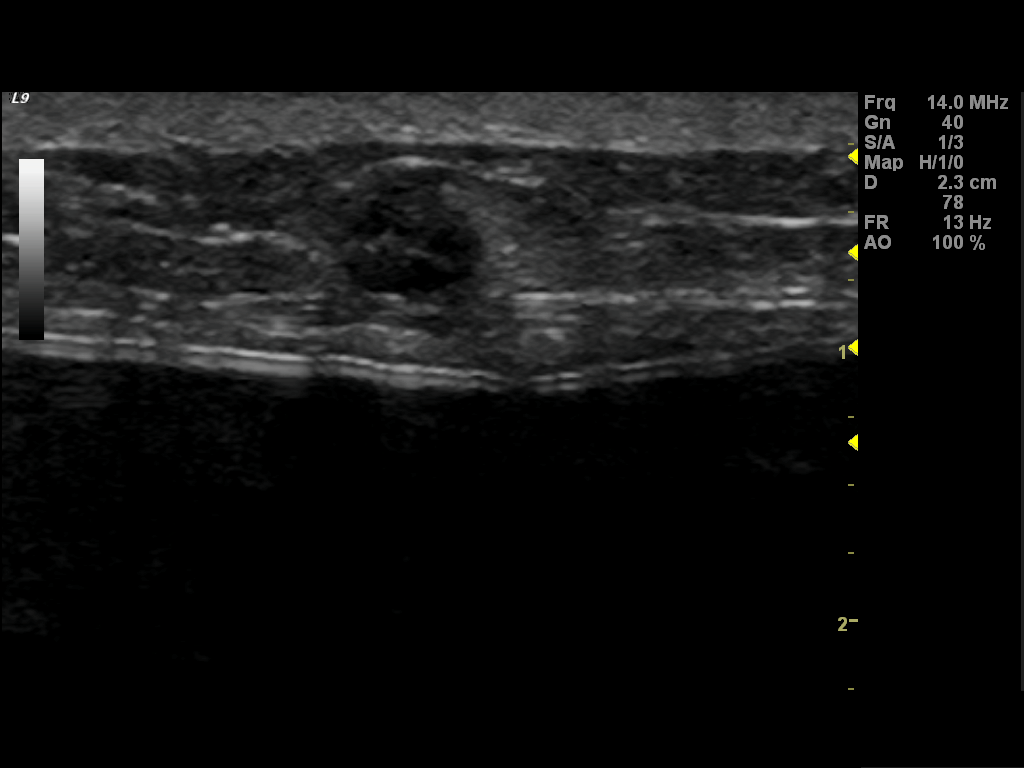
[im 4/5]
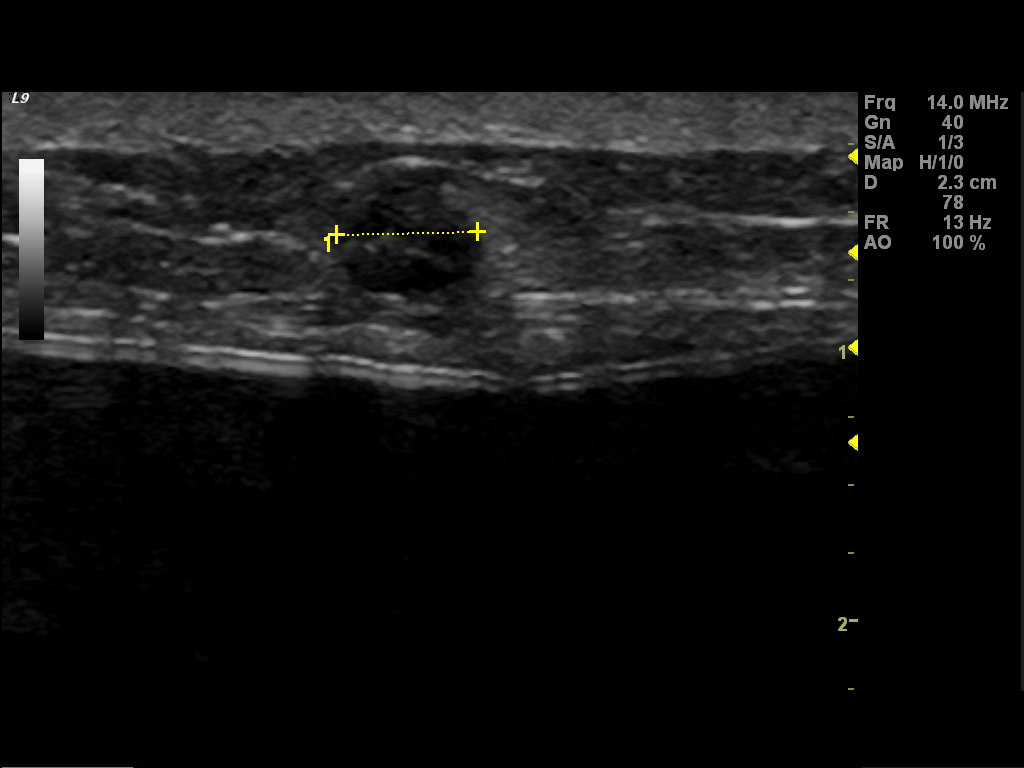
[im 5/5]
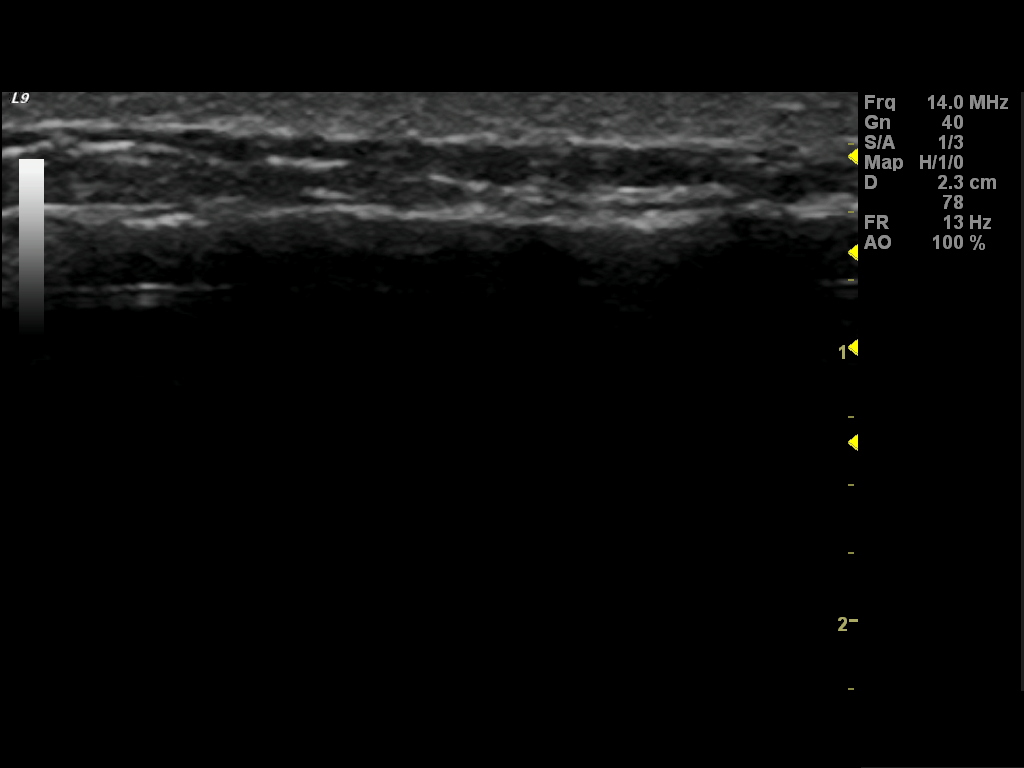

[5 of 5 positions shown; findings below may reference images not displayed]

FINDINGS: On physical exam,I do not palpate a discrete mass in either breast.

Ultrasound is performed, showing a mixed echogenicity nodule in the
right breast at 4 o'clock 4 cm from the reconstructed nipple. It
measures 0.7 x 0.5 x 0.5 cm. This may represent fat necrosis but
malignancy cannot be excluded in this patient with a history of
breast cancer. Sonographic evaluation of the right axilla does not
show any enlarged adenopathy.

Sonographic evaluation of clinical concern in the 7 o'clock region
of the left breast shows normal tissue.
IMPRESSION: Indeterminate nodule in the 4 o'clock region of the right breast
corresponding with the region seen on the patient's recent PET-CT.
Tissue sampling is recommended.

RECOMMENDATION:
Ultrasound-guided core biopsy of the right breast is recommended and
will be scheduled at the patient's convenience.

I have discussed the findings and recommendations with the patient.
Results were also provided in writing at the conclusion of the
visit. If applicable, a reminder letter will be sent to the patient
regarding the next appointment.

BI-RADS CATEGORY  4: Suspicious abnormality - biopsy should be
considered.

## 2015-08-03 ENCOUNTER — Ambulatory Visit (HOSPITAL_COMMUNITY)
Admission: RE | Admit: 2015-08-03 | Discharge: 2015-08-03 | Disposition: A | Discharge status: Home / Self Care | Payer: BLUE CROSS/BLUE SHIELD | Source: Ambulatory Visit | Attending: Obstetrics | Admitting: Obstetrics

## 2015-08-03 DIAGNOSIS — R109 Unspecified abdominal pain: Secondary | ICD-10-CM | POA: Insufficient documentation

## 2015-08-03 DIAGNOSIS — R188 Other ascites: Secondary | ICD-10-CM | POA: Insufficient documentation

## 2015-08-03 DIAGNOSIS — R102 Pelvic and perineal pain: Secondary | ICD-10-CM | POA: Diagnosis not present

## 2015-08-03 DIAGNOSIS — R140 Abdominal distension (gaseous): Secondary | ICD-10-CM

## 2015-08-03 DIAGNOSIS — N854 Malposition of uterus: Secondary | ICD-10-CM | POA: Diagnosis not present

## 2015-08-03 DIAGNOSIS — Z9013 Acquired absence of bilateral breasts and nipples: Secondary | ICD-10-CM | POA: Diagnosis not present

## 2015-08-03 DIAGNOSIS — Z853 Personal history of malignant neoplasm of breast: Secondary | ICD-10-CM | POA: Insufficient documentation

## 2015-08-04 LAB — LDL CHOLESTEROL, DIRECT: LDL Direct: 95 mg/dL (ref 0–99)

## 2015-08-04 LAB — HDL CHOLESTEROL: HDL: 87 mg/dL (ref >39)

## 2015-08-04 LAB — TSH: TSH: 1.62 u[IU]/mL (ref 0.450–4.500)

## 2015-08-04 LAB — HEMOGLOBIN A1C
Est. average glucose Bld gHb Est-mCnc: 105 mg/dL
Hgb A1c MFr Bld: 5.3 % (ref 4.8–5.6)

## 2015-08-04 LAB — VITAMIN D 25 HYDROXY (VIT D DEFICIENCY, FRACTURES): Vit D, 25-Hydroxy: 37.6 ng/mL (ref 30.0–100.0)

## 2015-08-04 LAB — CA 125: CA 125: 18.8 U/mL (ref 0.0–38.1)

## 2015-08-04 LAB — CHOLESTEROL, TOTAL: Cholesterol, Total: 174 mg/dL (ref 100–199)

## 2015-08-11 ENCOUNTER — Other Ambulatory Visit: Payer: Self-pay | Admitting: Obstetrics

## 2015-08-11 LAB — NUSWAB VAGINITIS (VG)
Candida albicans, NAA: POSITIVE — AB
Candida glabrata, NAA: POSITIVE — AB
Trich vag by NAA: NEGATIVE

## 2015-08-12 ENCOUNTER — Other Ambulatory Visit: Payer: Self-pay | Admitting: Oncology

## 2015-08-29 IMAGING — US US NEEDLE LOC R
1 series · 4 of 4 positions shown · non-contrast
Comparison: 04/09/2013, 04/08/2013

CLINICAL DATA: Right breast cancer recurrence in the right breast
reconstruction.

EXAM:
ULTRASOUND NEEDLE LOCALIZATION OF THE RIGHT BREAST RECONSTRUCTION

[Series 1: us needle loc right · 4 of 4 slices shown]
[im 1/4]
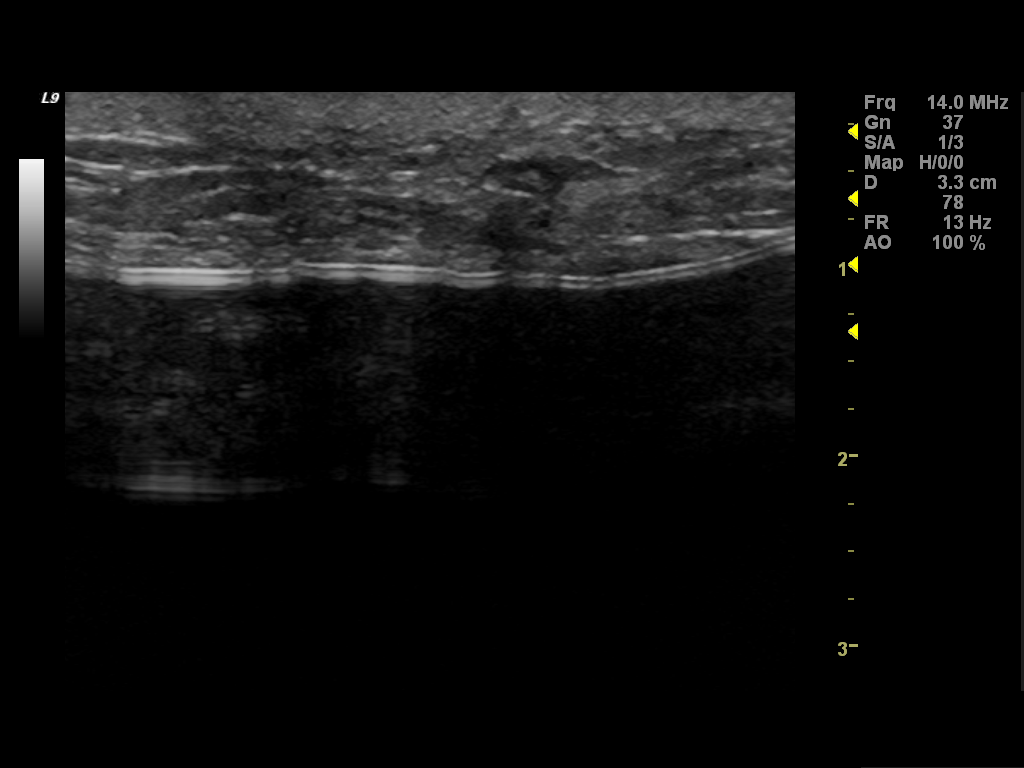
[im 2/4]
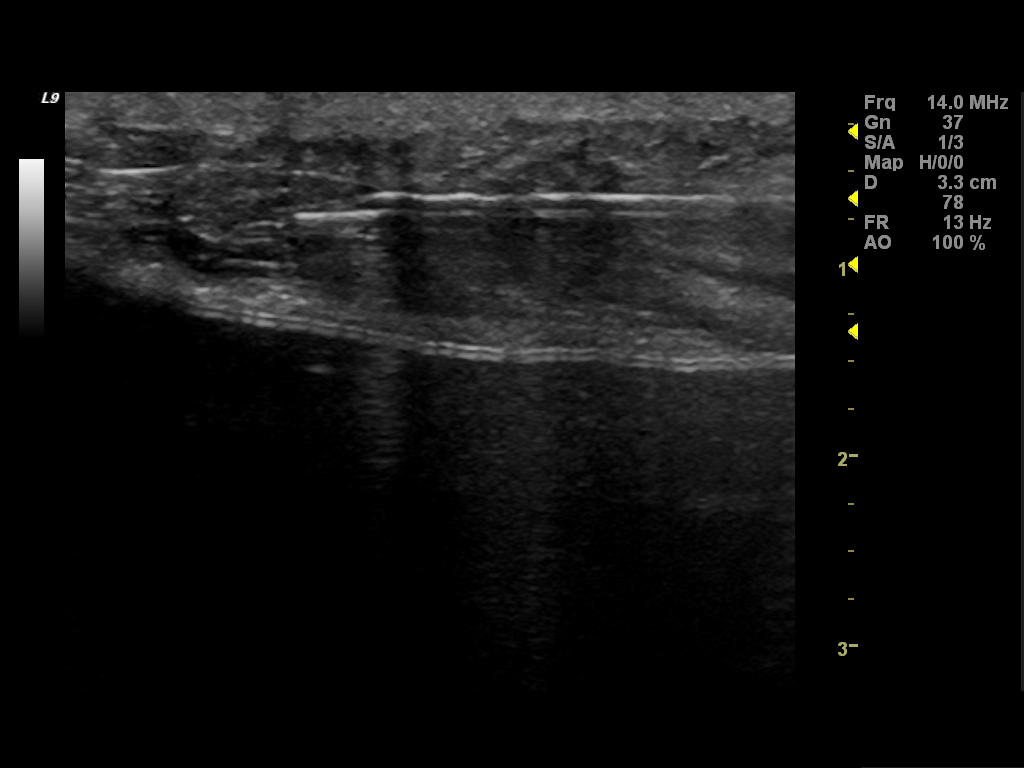
[im 3/4]
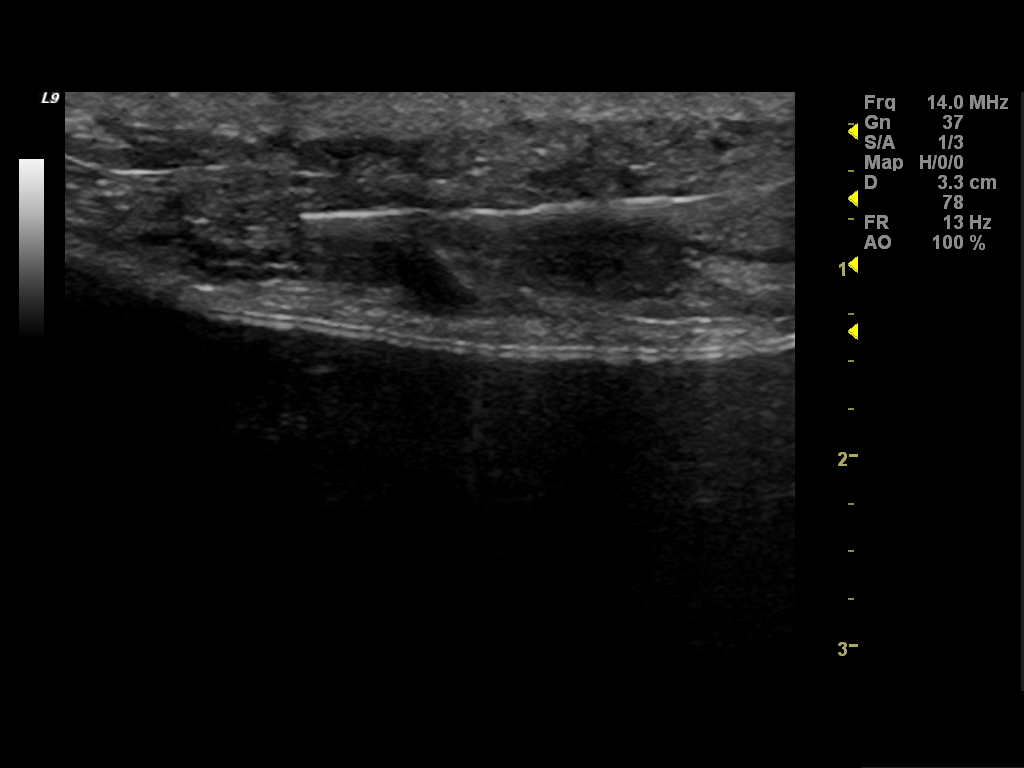
[im 4/4]
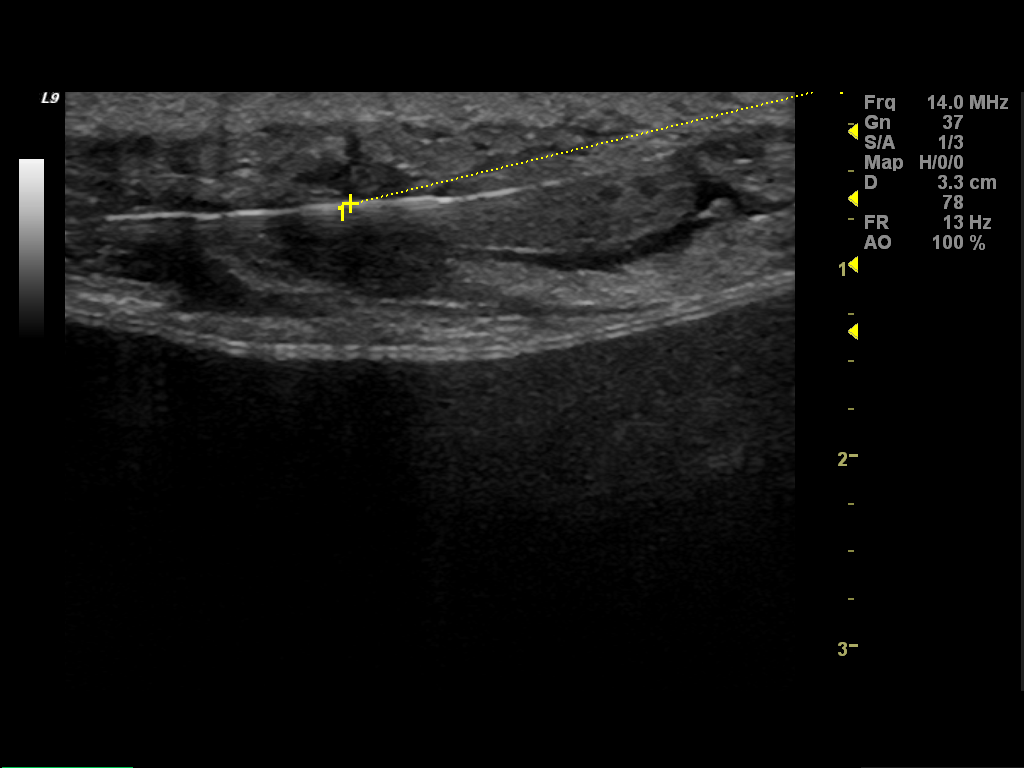

[4 of 4 positions shown; findings below may reference images not displayed]

FINDINGS: Patient presents for needle localization prior to wide excision. I
met with the patient and we discussed the procedure of needle
localization including benefits and alternatives. We discussed the
high likelihood of a successful procedure. We discussed the risks of
the procedure, including infection, bleeding, tissue injury, and
further surgery. Informed, written consent was given.

The usual time-out protocol was performed immediately prior to the
procedure.

Using ultrasound guidance, sterile technique, 2% lidocaine, and a 7
cm Inrad Ultrawire needle, the mass at 4 o'clock 4 cm from the right
reconstructed nipple waslocalized using a caudocranial approach. The
location of the mass was marked with an indelible marker on the
skin. Films were labeled and sent with the patient to surgery. She
tolerated the procedure well.

Specimen radiograph is performed at [HOSPITAL] Operating
Room and confirms the clip and wire to be present in the tissue
sample. The specimen is marked for pathology.
IMPRESSION: Needle localization right breast reconstruction. No apparent
complications.

## 2015-08-29 IMAGING — CR DG CHEST 2V
2 series · 2 of 2 positions shown · non-contrast
Comparison: 06/21/2011

CLINICAL DATA: Preoperative respiratory exam for history of breast
cancer.

EXAM:
CHEST - 2 VIEW

[w chest pa]
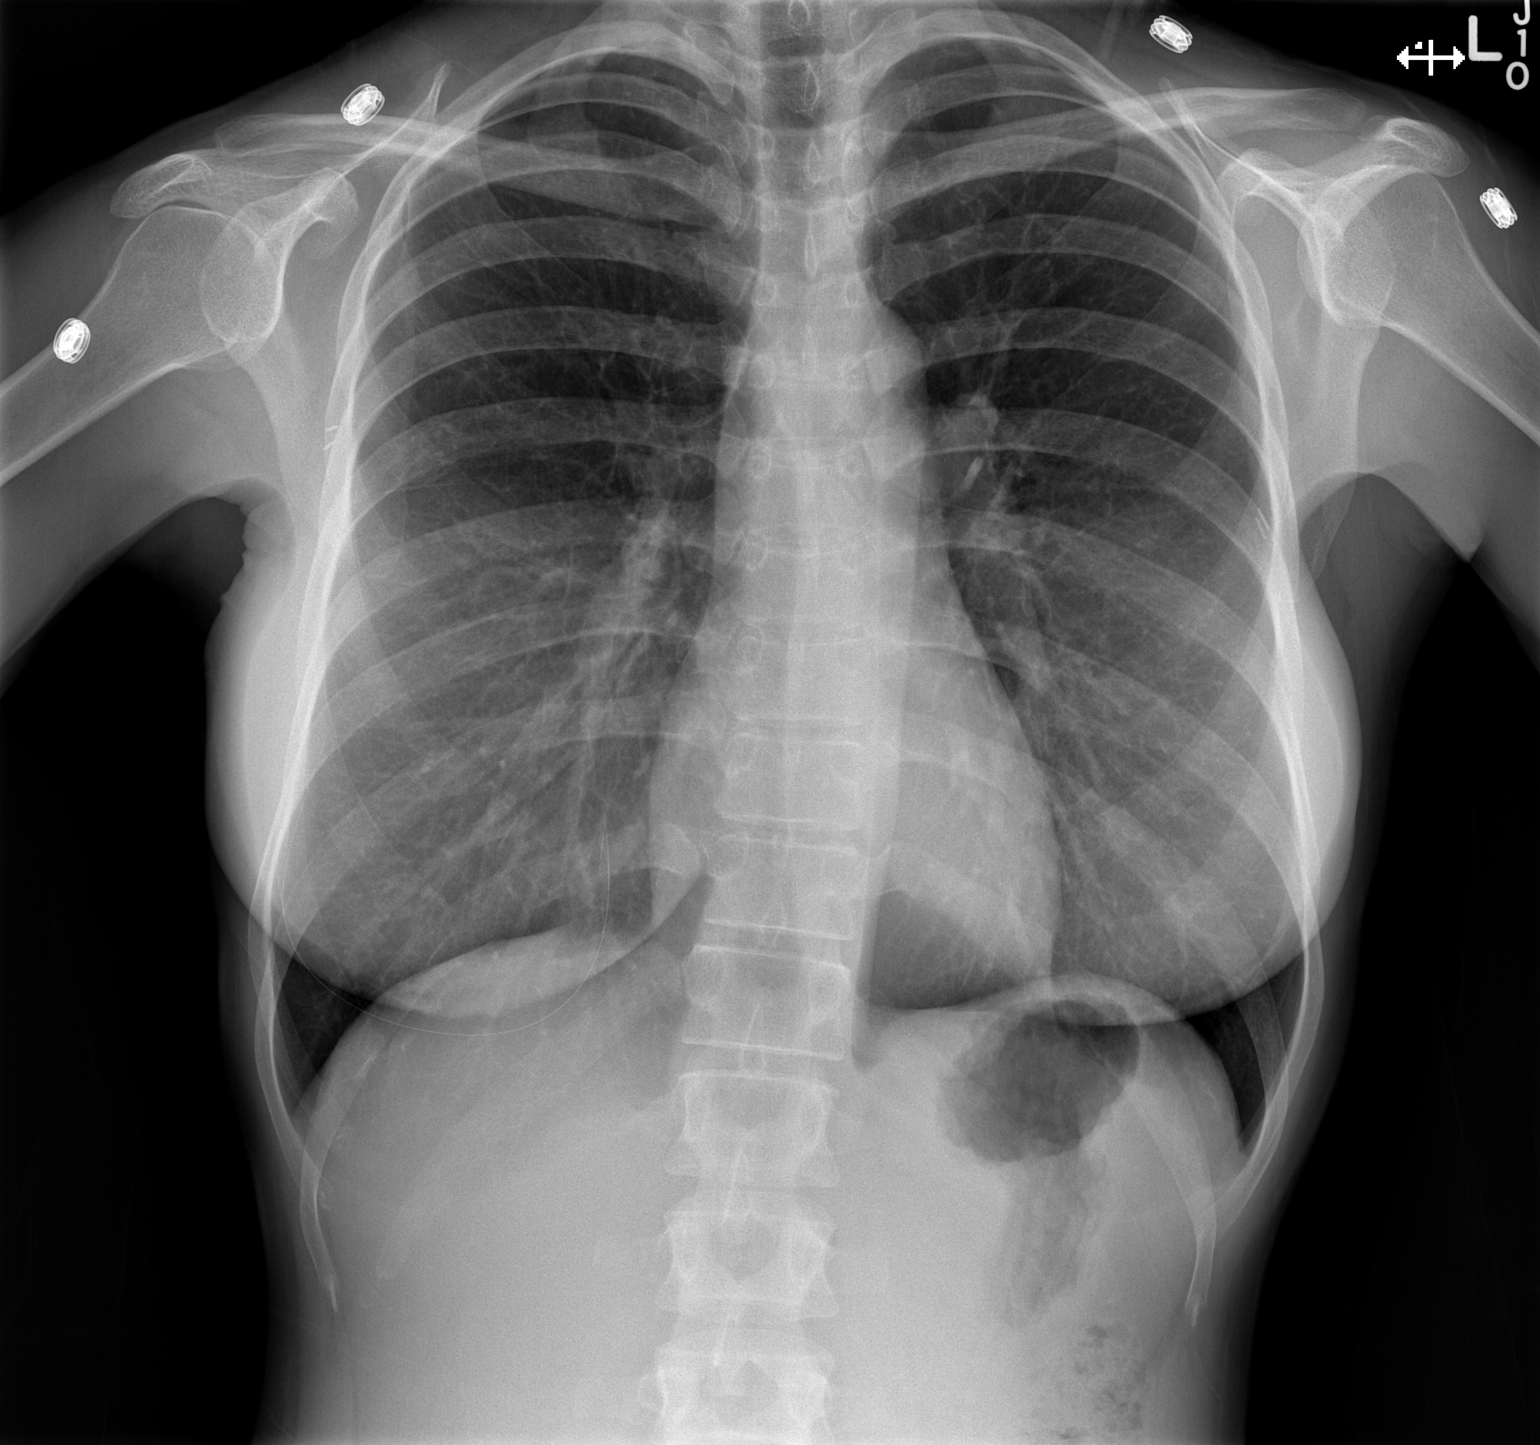

[w chest lat]
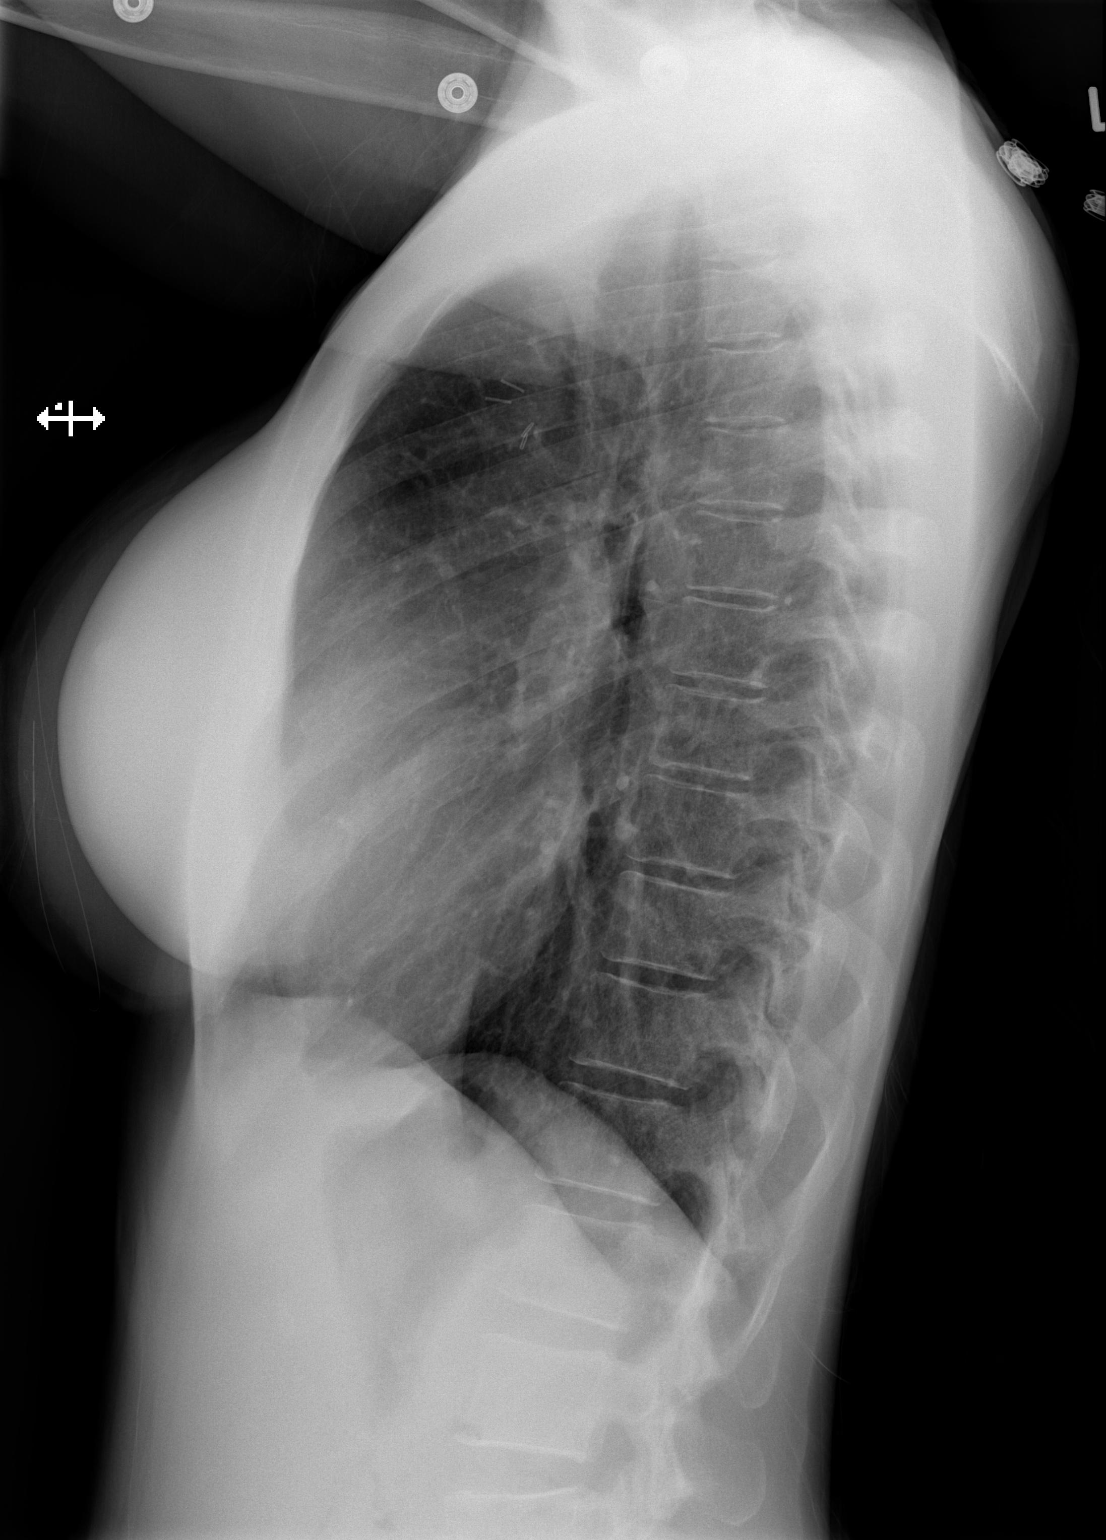

[2 of 2 positions shown; findings below may reference images not displayed]

FINDINGS: The heart size and mediastinal contours are within normal limits.
There is no evidence of pulmonary edema, consolidation,
pneumothorax, nodule or pleural fluid. The visualized skeletal
structures are unremarkable.
IMPRESSION: No active disease.

## 2015-09-02 ENCOUNTER — Other Ambulatory Visit: Payer: Self-pay | Admitting: Oncology

## 2015-10-10 ENCOUNTER — Other Ambulatory Visit: Payer: Self-pay | Admitting: Obstetrics

## 2015-11-05 ENCOUNTER — Other Ambulatory Visit: Payer: Self-pay | Admitting: *Deleted

## 2015-11-08 ENCOUNTER — Other Ambulatory Visit: Payer: Self-pay | Admitting: *Deleted

## 2015-11-08 DIAGNOSIS — C50311 Malignant neoplasm of lower-inner quadrant of right female breast: Secondary | ICD-10-CM

## 2015-11-09 ENCOUNTER — Other Ambulatory Visit (HOSPITAL_BASED_OUTPATIENT_CLINIC_OR_DEPARTMENT_OTHER): Payer: BLUE CROSS/BLUE SHIELD

## 2015-11-09 ENCOUNTER — Other Ambulatory Visit: Payer: Self-pay

## 2015-11-09 DIAGNOSIS — C50311 Malignant neoplasm of lower-inner quadrant of right female breast: Secondary | ICD-10-CM

## 2015-11-09 LAB — COMPREHENSIVE METABOLIC PANEL
ALT: 23 U/L (ref 0–55)
AST: 26 U/L (ref 5–34)
Albumin: 4.2 g/dL (ref 3.5–5.0)
Alkaline Phosphatase: 87 U/L (ref 40–150)
Anion Gap: 8 mEq/L (ref 3–11)
BUN: 18.5 mg/dL (ref 7.0–26.0)
CO2: 28 mEq/L (ref 22–29)
Calcium: 10.1 mg/dL (ref 8.4–10.4)
Chloride: 104 mEq/L (ref 98–109)
Creatinine: 0.9 mg/dL (ref 0.6–1.1)
EGFR: 80 mL/min/{1.73_m2} — ABNORMAL LOW (ref >90)
Glucose: 82 mg/dl (ref 70–140)
Potassium: 4.3 mEq/L (ref 3.5–5.1)
Sodium: 140 mEq/L (ref 136–145)
Total Bilirubin: 0.63 mg/dL (ref 0.20–1.20)
Total Protein: 7.2 g/dL (ref 6.4–8.3)

## 2015-11-09 LAB — CBC WITH DIFFERENTIAL/PLATELET
BASO%: 0.6 % (ref 0.0–2.0)
Basophils Absolute: 0 10*3/uL (ref 0.0–0.1)
EOS%: 2.1 % (ref 0.0–7.0)
Eosinophils Absolute: 0.1 10*3/uL (ref 0.0–0.5)
HCT: 41.3 % (ref 34.8–46.6)
HGB: 13.7 g/dL (ref 11.6–15.9)
LYMPH%: 22.8 % (ref 14.0–49.7)
MCH: 30.5 pg (ref 25.1–34.0)
MCHC: 33.1 g/dL (ref 31.5–36.0)
MCV: 92.1 fL (ref 79.5–101.0)
MONO#: 0.4 10*3/uL (ref 0.1–0.9)
MONO%: 5.6 % (ref 0.0–14.0)
NEUT#: 4.4 10*3/uL (ref 1.5–6.5)
NEUT%: 68.9 % (ref 38.4–76.8)
Platelets: 231 10*3/uL (ref 145–400)
RBC: 4.48 10*6/uL (ref 3.70–5.45)
RDW: 12.9 % (ref 11.2–14.5)
WBC: 6.4 10*3/uL (ref 3.9–10.3)
lymph#: 1.5 10*3/uL (ref 0.9–3.3)

## 2015-11-10 LAB — FOLLICLE STIMULATING HORMONE: FSH: 110.3 m[IU]/mL

## 2015-11-10 LAB — ESTRADIOL: Estradiol: 9 pg/mL

## 2015-11-15 ENCOUNTER — Other Ambulatory Visit: Payer: Self-pay | Admitting: Oncology

## 2015-11-15 DIAGNOSIS — C50311 Malignant neoplasm of lower-inner quadrant of right female breast: Secondary | ICD-10-CM

## 2015-11-15 DIAGNOSIS — C50911 Malignant neoplasm of unspecified site of right female breast: Secondary | ICD-10-CM

## 2015-11-21 NOTE — Progress Notes (Signed)
ID: [name]   DOB: 1970/06/25  MR#: [id number]  CSN#:[id number]   PCP: Judith Cruel, MD GYN: Judith Najjar MD SU: Judith Skates MD OTHER MD: [name], [name], [name]  CHIEF COMPLAINT:  Recurrent Breast Cancer, Right  CURRENT THERAPY: Anastrozole   BREAST CANCER HISTORY:  From the original intake note:  "[name] (goes by "[name]") is a [place] woman who, at the age of 45, was referred by Dr. Baltazar [name] for treatment of right breast carcinoma.   1.  The patient palpated a mass in her right breast which [name] brought to Dr. Jacelyn Grip attention. A prior screening mammogram on 02/06/2010 had been unremarkable. The patient was then scheduled for bilateral diagnostic mammogram and right breast ultrasonography on 12/22/2010, the studies demonstrating an ill defined mass with heterogeneous calcifications in the inner lower right breast, measuring 1.7 cm. The breasts are heterogeneously dense, but there were no other mammographic abnormalities in either breast. Clinically, there was a palpable firm mass at the 5:00 position of the right breast, 5 cm from the nipple. There were no palpable abnormalities in the right axilla. Ultrasound confirmed a 1.7 cm irregular, hypoechoic mass in the same position. There were some lymph nodes noted in the right exam of, one measuring 1.6 cm with a minimally thickened cortex inferiorly.  An ultrasound-guided core biopsy on 12/23/[id number]) showed an invasive ductal carcinoma, grade 2, ER positive at 88%, PR positive at 11 is %, HER-2/neu positive with a FISH ratio of 4.61, and an MIB-1 of 36%.  The patient was referred to Dr. Fanny [name] and bilateral breast MRIs were obtained 12/30/2010. This showed the mass in question in the right breast to measure 2.0 cm. In the right axilla there was a 1.8 cm lymph node with a thickened cortex. The left breast was unremarkable.  Her subsequent treatment is as detailed  below.  2.   Judith then had a restaging PET scan late December 2014 which was positive only for a small subcutaneous nodule in the right breast lower outer quadrant. There was no other finding of concern. Ultrasound of both breasts 04/08/2013 showed a nodule in the right breast at the 4:00 position measuring 0.7 cm. The right axilla was clear. There was an area at the 7:00 position of the left breast which was of concern by palpation but showed only normal tissue.   Biopsy of the right breast mass 04/09/2013 showed an invasive ductal carcinoma, grade 2, estrogen receptor 94% positive, progesterone receptor 11% positive, with an MIB-1 of 19% and HER-2 amplified with a signals ratio of 3.75 and the number per cell of 4.50. The patient discussed the situation with with her surgeon's and on 05/05/2013 proceeded to right lumpectomy, with the final pathology (S. is a [id number]) confirming an invasive ductal carcinoma, 0.7 cm, grade 2, within a millimeter of the posterior margin, which showed skeletal muscle. A port was placed at the time of that procedure."  Her subsequent treatment is as detailed below.  INTERVAL HISTORY:  [name] returns today for follow up of her breast cancer.  After trying several anti-estgens [name] is now stabl on anasrozole. The main problems [name] has from this is vaginal dryness.  Hot flashes are much less of a current concern. [name] obtains it at a good price  REVIEW OF SYSTEMS:  [name] continues to have back pain which is not  Like a fist anymore it's more like a bar. It is across her lower back.  This interrupts her daily activities. [name] has to sit down and put her legs up because of the discomfort. [name] had a rash which lasted about 24 hours. [name] took Benadryl for that and it resolved. [name] feels her belly is getting bigger. [name] has been quite constipated despite taking fiber and stool softeners. [name] gets headaches sometimes associated with the constipation. [name] gave up chocolate for  length and that actually worked pretty well and seemed to improve her sense of taste but now [name] is taking some in her sense of taste is again abnormal. [name] is walking and doing a little bit of jogging for exercise. [name] feels moderately fatigued. [name] has a runny nose. Sometimes is hard to swallow. [name] can be short of breath particularly when walking upstairs. [name] can have nausea at times. A detailed review of systems today was otherwise stable.   PAST MEDICAL HISTORY: Past Medical History:  Diagnosis Date  . Anxiety   . Asthma    as a child  . Breast cancer [facility]) Sept 2012   right breast, inner lower right  . Cervical dysplasia   . GERD (gastroesophageal reflux disease)    had during chemo  . Hx of radiation therapy 11/24/13- 01/08/14   reconstructed right breast/chest wall 4500 cGy 25 sessions, electron beam boost 1440 cGy 8 sessions  . Kidney stones   . Migraines   . Pneumonia   . PONV (postoperative nausea and vomiting)   . Rash    neck  1. History of prior adenoidectomy. 2. History of rhinoplasty.   3. History of multiple uterine cervical treatments for precancerous lesions, none for the past 10 years or so. 4. History of fibroid tumor surgery. 5. History of D and C. 6. History of dysplastic nevi removed by Dr. Ronnald Ramp. 7. History of right "lazy eye" corrective surgery. 8. History of approximately 10-pack-years of tobacco abuse, resolved.  9. History of possible migraines (morning headaches). History of asthma as a child.  PAST SURGICAL HISTORY: Past Surgical History:  Procedure Laterality Date  . ADENOIDECTOMY    . BREAST LUMPECTOMY WITH NEEDLE LOCALIZATION Right 05/05/2013   Procedure: EXCISION RECURRENT CANCER RIGHT BREAST WITH NEEDLE LOCALOZATION;  Surgeon: Adin Hector, MD;  Location: [place];  Service: General;  Laterality: Right;  . BREAST RECONSTRUCTION  06/29/2011   Procedure: BREAST RECONSTRUCTION;  Surgeon: Judith Kos, DO;  Location: [place];  Service: Plastics;  Laterality: Bilateral;  Immediate Bilateral Breast Reconstruction with Bilateral Tissue Expanders and placement of  Alloderm   . DILATION AND CURETTAGE OF UTERUS    . fibroid tumor surgery    . lazy eye     corrective surgery  . MASTECTOMY W/ SENTINEL NODE BIOPSY  06/29/2011   Procedure: MASTECTOMY WITH SENTINEL LYMPH NODE BIOPSY;  Surgeon: Adin Hector, MD;  Location: [place];  Service: General;  Laterality: Right;  right skin spairing mstectomy and right sentinel lymph node biopsy  . PORT-A-CATH REMOVAL  03/12/2012   Procedure: MINOR REMOVAL PORT-A-CATH;  Surgeon: Adin Hector, MD;  Location: [place];  Service: General;  Laterality: N/A;  Upper left  . PORTACATH PLACEMENT Left 05/05/2013   Procedure: INSERTION PORT-A-CATH;  Surgeon: Adin Hector, MD;  Location: [place];  Service: General;  Laterality: Left;  . RHINOPLASTY    . uterine cervical treatments  2002   for precancerous lesions    FAMILY HISTORY Family History  Problem Relation Age  of Onset  . Adopted: Yes  . Heart attack Father   . Cancer Cousin 40    melanoma; mat first cousin, located on neck  . Cancer Maternal Aunt 60    stomach and ovarian as separate primaries  . Cancer Maternal Uncle 65    lung; smoker  . Cancer Maternal Grandmother 60    enodometrial  . Cancer Maternal Aunt 55    breast  . Cancer Maternal Uncle 63    prostate  :  The patient's biological father died at the age of 62 from myocardial infarction.  The patient's biological mother is alive at age 49.  The patient was adopted by her biological mother's parents.  [name] has 3 half-brothers.  No sisters.  One cousin on her mother's side died from melanoma at the age of 78.  There is other cancer history in the adopted side but these are not even half-brothers or sisters, they are children of her adopted parents who are really her grandparents so these would be uncles and aunts (1 lymph  node cancer, 1 prostate cancer, 1 breast cancer at the age of 45).  GYNECOLOGIC HISTORY:   (Updated 05/22/2013) [name] had menarche age 71 or 46.  [name] is GX, P1, first pregnancy to term at age 59. [name] stopped having periods approximately March 2013.  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  Her husband, Tashona Calk owns a family business called [employer].  They have a son, Kellie Simmering, age 61. Romilda Joy also has a daughter, Lynnsey Barbara, who lives in [place] and had twins in 2016.  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  The patient is not a church attender.    ADVANCED DIRECTIVES: Not in place  HEALTH MAINTENANCE:  (Updated 06/12/2013) Social History  Substance Use Topics  . Smoking status: Former Smoker    Quit date: 07/20/1996  . Smokeless tobacco: Never Used  . Alcohol use No     Colonoscopy:  Never  PAP: Oct 2014/[facility]  Bone density: Oct 2014, mild osteopenia with a T score of -1.1  Lipid panel: Not on file  Allergies  Allergen Reactions  . Tussionex Pennkinetic Er [Hydrocod Polst-Cpm Polst Er] Other (See Comments)    hallucinations  . Codeine Other (See Comments)    unkown  . Morphine Rash  . Morphine And Related Anxiety    anxiety  . Penicillin G Rash  . Penicillins Rash    Current Outpatient Prescriptions  Medication Sig Dispense Refill  . anastrozole (ARIMIDEX) 1 MG tablet Take 1 tablet (1 mg total) by mouth daily. 90 tablet 4  . aspirin-acetaminophen-caffeine (EXCEDRIN MIGRAINE) 563-149-70 MG per tablet Take 2 tablets by mouth every 6 (six) hours as needed for headache.    . Biotin 5000 MCG CAPS Take 5,000 mcg by mouth daily.    . calcium citrate-vitamin D (CITRACAL+D) 315-200 MG-UNIT tablet Take 1 tablet by mouth 2 (two) times daily.    .  fluconazole (DIFLUCAN) 100 MG tablet Take 1 tablet (100 mg total) by mouth daily. 5 tablet 0  . ibuprofen (ADVIL,MOTRIN) 800 MG tablet Take 1 tablet (800 mg total) by mouth every 8 (eight) hours as needed. 30 tablet 5  . LORazepam (ATIVAN) 0.5 MG tablet TAKE ONE TABLET BY MOUTH TWICE DAILY AS NEEDED  FOR ANXIETY 60 tablet 0  . Prenatal Vit-Fe Fumarate-FA (PRENATAL VITAMIN PLUS LOW IRON) 27-1 MG TABS TAKE ONE TABLET BY MOUTH ONCE DAILY 90 tablet 3  . traMADol (ULTRAM) 50 MG tablet Take 1 tablet (50 mg total) by mouth every 6 (six) hours as needed. for pain 30 tablet 0   No current facility-administered medications for this visit.     OBJECTIVE: Middle-aged white woman who appears stated age 45 Vitals:   11/22/15 1255  BP: 95/64  Pulse: 74  Resp: 18  Temp: 98.3 F (36.8 C)     Body mass index is 22.57 kg/m.    ECOG FS: 1 Filed Weights   11/22/15 1255  Weight: 127 lb 6.4 oz (57.8 kg)   Sclerae unicteric, pupils round and equal Oropharynx clear and moist-- no thrush or other lesions No cervical or supraclavicular adenopathy, no palpable thyromegaly Lungs no rales or rhonchi Heart regular rate and rhythm Abd soft, nontender, positive bowel sounds MSK no focal spinal tenderness, no upper extremity lymphedema Neuro: nonfocal, well oriented, appropriate affect Breasts: s/p bilateral mastectomies with bilateral reconstruction; no suspicious findings; both axillae are benign.    LAB RESULTS: Lab Results  Component Value Date   WBC 6.4 11/09/2015   NEUTROABS 4.4 11/09/2015   HGB 13.7 11/09/2015   HCT 41.3 11/09/2015   MCV 92.1 11/09/2015   PLT 231 11/09/2015      Chemistry      Component Value Date/Time   NA 140 11/09/2015 1406   K 4.3 11/09/2015 1406   CL 103 01/08/2014 1459   CL 104 09/23/2012 1415   CO2 28 11/09/2015 1406   BUN 18.5 11/09/2015 1406   CREATININE 0.9 11/09/2015 1406      Component Value Date/Time   CALCIUM 10.1 11/09/2015 1406   ALKPHOS 87 11/09/2015  1406   AST 26 11/09/2015 1406   ALT 23 11/09/2015 1406   BILITOT 0.63 11/09/2015 1406      STUDIES: No results found.   ASSESSMENT: 45 y.o.  BRCA 1-2 negative [name], [place] woman,   (1) status post right breast lower inner quadrant biopsy in September 2012 for a clinically 2.0 cm invasive ductal carcinoma, with some enlarged Right axillary lymph nodes, the largest one of which was negative by biopsy. The tumor was grade 2, estrogen receptor positive at 88%, progesterone receptor positive at 11%, HER-2/neu positive by CISH with a ratio of 4.61, and a proliferation marker of 36%.   (2) treated in the neoadjuvant setting with Q 3 week docetaxel/ carboplatin/ trastuzumab x6, completed 05/18/2011.  Continued on trastuzumab every 3 weeks to complete one year, last dose in November 2013.  (3) s/p bilateral mastectomies with sentinel node biopsy 06/29/2011 for a residual right-sided ypT1c ypN0 invasive ductal carcinoma, grade 2, with immediate expander placement  (4) on tamoxifen as of May 2013, discontinued in early 2015 due to breast cancer recurrence   RECURRENT BREAST CANCER January 2015 (5) status post right lumpectomy with right-sided sentinel lymph node sampling 05/05/2013 for a pT1b cN0, stage IA invasive ductal carcinoma, estrogen receptor 94% positive, progesterone receptor 11% positive, with an MIB-1 of 19% and HER-2 amplification  (6)  Received TDM-1 every 3 weeks x 8 doses completed 10/09/2013; echo 08/25/2013 showed a well preserved ejection fraction  (7) adjuvant radiation therapy completed 01/08/2014  (8) started anastrozole 04/10/2014   (a) [place] and estradiol in menopausal range 02/04/2014, to be followed every 3 months  (b) bone density 01/15/2013 showed a T score of -  1.1  (c) switched to letrozole 07/07/2014, discontinued June 2016 because of arthralgias/myalgias  (d) started exemestane September 2016, stopped March 2017 because of cost issues  (e) bone density  06/17/2015 showed a T score of -1.5   (f) anastrozole resumed April 2017  (9) genetic testing (23 genes associated with hereditary ovarian cancer through [facility], April 2015) 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   PLAN:  I asked [name] where [name] thought [name] fell on the spectrum between being very anxious about little things and dismissing even major things. [name] places her self close to the dismissing ofeven major things, since [name] has a lot of trust in God.  This is important because it affects our decisions regarding evaluation of symptoms. The concern with scans of course is radiation exposure and false positives. On the other hand what we would not want to do is ignore symptoms that might be significant.  Right now [name] is having several issues of concern. [name] feels bloated and like her stomach is growing. [name] feels nauseated. [name] tells me there is a family history of stomach cancer and [name] is of course concerned about that. [name] has significant back pain, which affects her daily activities. Any of these things might be minor or they could be significant.  We have requested a PET scan prior to this visit so we could evaluate this further, but this was denied by insurance.  Accordingly I am setting her up for CT scans of the chest abdomen and pelvis, to be done within the next 2 weeks. [name] will call with those results if [name] has not heard from [place] within a day of the tests. Hopefully they will be reassuring.  [facility] and estradiol are reassuringly postmenopausal. I do think we need to address the vaginal dryness issue and I have referred her to our intimacy and pelvic health program.  [name] will see me again in 3 months. [name] will have lab work before that visit and I will also check a CA-27-29. [name] knows to call for any problems that may develop before then    Judith Cruel, MD    11/22/2015

## 2015-11-22 ENCOUNTER — Ambulatory Visit (HOSPITAL_BASED_OUTPATIENT_CLINIC_OR_DEPARTMENT_OTHER): Payer: BLUE CROSS/BLUE SHIELD | Admitting: Oncology

## 2015-11-22 ENCOUNTER — Telehealth: Payer: Self-pay | Admitting: Oncology

## 2015-11-22 VITALS — BP 95/64 | HR 74 | Temp 98.3°F | Resp 18 | Ht 63.0 in | Wt 127.4 lb

## 2015-11-22 DIAGNOSIS — C50911 Malignant neoplasm of unspecified site of right female breast: Secondary | ICD-10-CM | POA: Diagnosis not present

## 2015-11-22 DIAGNOSIS — R140 Abdominal distension (gaseous): Secondary | ICD-10-CM

## 2015-11-22 DIAGNOSIS — C50311 Malignant neoplasm of lower-inner quadrant of right female breast: Secondary | ICD-10-CM

## 2015-11-22 DIAGNOSIS — R110 Nausea: Secondary | ICD-10-CM

## 2015-11-22 DIAGNOSIS — Z170 Estrogen receptor positive status [ER+]: Secondary | ICD-10-CM

## 2015-11-22 DIAGNOSIS — M549 Dorsalgia, unspecified: Secondary | ICD-10-CM

## 2015-11-22 MED ORDER — ANASTROZOLE 1 MG PO TABS: 1.0000 mg | ORAL_TABLET | Freq: Every day | ORAL | 4 refills | Status: DC

## 2015-11-22 MED ORDER — TRAMADOL HCL 50 MG PO TABS: 50.0000 mg | ORAL_TABLET | Freq: Four times a day (QID) | ORAL | 0 refills | Status: DC | PRN

## 2015-11-22 NOTE — Telephone Encounter (Signed)
appt made and avs printed. Contrast given to pt today. CT to be scheduled by central radiology.

## 2015-11-30 ENCOUNTER — Other Ambulatory Visit: Payer: Self-pay | Admitting: *Deleted

## 2015-12-27 ENCOUNTER — Other Ambulatory Visit: Payer: Self-pay | Admitting: *Deleted

## 2015-12-27 ENCOUNTER — Telehealth: Payer: Self-pay | Admitting: *Deleted

## 2015-12-27 DIAGNOSIS — C50911 Malignant neoplasm of unspecified site of right female breast: Secondary | ICD-10-CM

## 2015-12-27 NOTE — Telephone Encounter (Signed)
Received vm call from pt stating that she is having a CT done Wed at [place] wants to know if this will include her neck & spine.  She reports having a lot of pain in that area & it makes her head feel funny.  Called her back & she states that she has been having pain in her neck & shoulders which makes her head feel weird.  She states that it's not just a regular h/a but throbs all over-from back of head to top to back.  She states that this has been going on for a few months but she forgot to mention to Dr Jana Hakim.  She is still having trouble with bloating after eating & bowels are not moving good, usually once/ week.  Discussed CT wit Dr Jana Hakim & CT neck should be added to scans. Will order & let managed care know.

## 2015-12-29 ENCOUNTER — Ambulatory Visit (HOSPITAL_COMMUNITY)
Admission: RE | Admit: 2015-12-29 | Discharge: 2015-12-29 | Disposition: A | Discharge status: Home / Self Care | Payer: BLUE CROSS/BLUE SHIELD | Source: Ambulatory Visit | Attending: Oncology | Admitting: Oncology

## 2015-12-29 ENCOUNTER — Encounter (HOSPITAL_COMMUNITY): Payer: Self-pay

## 2015-12-29 DIAGNOSIS — N200 Calculus of kidney: Secondary | ICD-10-CM | POA: Diagnosis not present

## 2015-12-29 DIAGNOSIS — C50311 Malignant neoplasm of lower-inner quadrant of right female breast: Secondary | ICD-10-CM | POA: Diagnosis present

## 2015-12-29 DIAGNOSIS — C50911 Malignant neoplasm of unspecified site of right female breast: Secondary | ICD-10-CM

## 2015-12-29 MED ORDER — IOPAMIDOL (ISOVUE-300) INJECTION 61%
100.0000 mL | Freq: Once | INTRAVENOUS | Status: AC | PRN
  Administered 2015-12-29: 100 mL via INTRAVENOUS

## 2015-12-31 ENCOUNTER — Other Ambulatory Visit: Payer: Self-pay | Admitting: Oncology

## 2016-01-11 ENCOUNTER — Other Ambulatory Visit: Payer: Self-pay | Admitting: Oncology

## 2016-01-15 ENCOUNTER — Other Ambulatory Visit: Payer: Self-pay | Admitting: Oncology

## 2016-01-15 DIAGNOSIS — C50311 Malignant neoplasm of lower-inner quadrant of right female breast: Secondary | ICD-10-CM

## 2016-01-15 DIAGNOSIS — C50911 Malignant neoplasm of unspecified site of right female breast: Secondary | ICD-10-CM

## 2016-02-09 ENCOUNTER — Other Ambulatory Visit (HOSPITAL_BASED_OUTPATIENT_CLINIC_OR_DEPARTMENT_OTHER): Payer: BLUE CROSS/BLUE SHIELD

## 2016-02-09 DIAGNOSIS — C50911 Malignant neoplasm of unspecified site of right female breast: Secondary | ICD-10-CM

## 2016-02-09 DIAGNOSIS — C50311 Malignant neoplasm of lower-inner quadrant of right female breast: Secondary | ICD-10-CM | POA: Diagnosis not present

## 2016-02-09 LAB — COMPREHENSIVE METABOLIC PANEL
ALT: 27 U/L (ref 0–55)
AST: 29 U/L (ref 5–34)
Albumin: 4.1 g/dL (ref 3.5–5.0)
Alkaline Phosphatase: 96 U/L (ref 40–150)
Anion Gap: 8 mEq/L (ref 3–11)
BUN: 14 mg/dL (ref 7.0–26.0)
CO2: 28 mEq/L (ref 22–29)
Calcium: 9.8 mg/dL (ref 8.4–10.4)
Chloride: 106 mEq/L (ref 98–109)
Creatinine: 0.9 mg/dL (ref 0.6–1.1)
EGFR: 81 mL/min/{1.73_m2} — ABNORMAL LOW (ref >90)
Glucose: 77 mg/dl (ref 70–140)
Potassium: 4.2 mEq/L (ref 3.5–5.1)
Sodium: 143 mEq/L (ref 136–145)
Total Bilirubin: 0.68 mg/dL (ref 0.20–1.20)
Total Protein: 7.1 g/dL (ref 6.4–8.3)

## 2016-02-09 LAB — CBC WITH DIFFERENTIAL/PLATELET
BASO%: 0.4 % (ref 0.0–2.0)
Basophils Absolute: 0 10*3/uL (ref 0.0–0.1)
EOS%: 2.6 % (ref 0.0–7.0)
Eosinophils Absolute: 0.1 10*3/uL (ref 0.0–0.5)
HCT: 42.4 % (ref 34.8–46.6)
HGB: 13.9 g/dL (ref 11.6–15.9)
LYMPH%: 24.1 % (ref 14.0–49.7)
MCH: 30.1 pg (ref 25.1–34.0)
MCHC: 32.9 g/dL (ref 31.5–36.0)
MCV: 91.5 fL (ref 79.5–101.0)
MONO#: 0.4 10*3/uL (ref 0.1–0.9)
MONO%: 6.8 % (ref 0.0–14.0)
NEUT#: 3.5 10*3/uL (ref 1.5–6.5)
NEUT%: 66.1 % (ref 38.4–76.8)
Platelets: 230 10*3/uL (ref 145–400)
RBC: 4.64 10*6/uL (ref 3.70–5.45)
RDW: 12.5 % (ref 11.2–14.5)
WBC: 5.3 10*3/uL (ref 3.9–10.3)
lymph#: 1.3 10*3/uL (ref 0.9–3.3)

## 2016-02-10 LAB — FOLLICLE STIMULATING HORMONE: FSH: 134 m[IU]/mL

## 2016-02-10 LAB — ESTRADIOL: Estradiol: <5 pg/mL

## 2016-02-10 LAB — CANCER ANTIGEN 27.29: CA 27.29: 29.1 U/mL (ref 0.0–38.6)

## 2016-02-23 ENCOUNTER — Ambulatory Visit (HOSPITAL_BASED_OUTPATIENT_CLINIC_OR_DEPARTMENT_OTHER): Payer: BLUE CROSS/BLUE SHIELD | Admitting: Oncology

## 2016-02-23 VITALS — BP 103/80 | HR 62 | Temp 97.5°F | Resp 17 | Ht 63.0 in | Wt 124.9 lb

## 2016-02-23 DIAGNOSIS — Z79811 Long term (current) use of aromatase inhibitors: Secondary | ICD-10-CM

## 2016-02-23 DIAGNOSIS — C50911 Malignant neoplasm of unspecified site of right female breast: Secondary | ICD-10-CM

## 2016-02-23 DIAGNOSIS — Z170 Estrogen receptor positive status [ER+]: Secondary | ICD-10-CM | POA: Diagnosis not present

## 2016-02-23 DIAGNOSIS — C50311 Malignant neoplasm of lower-inner quadrant of right female breast: Secondary | ICD-10-CM

## 2016-02-23 MED ORDER — LORAZEPAM 0.5 MG PO TABS
0.5000 mg | ORAL_TABLET | Freq: Two times a day (BID) | ORAL | 0 refills | Status: DC | PRN
Start: 2016-02-23 — End: 2017-02-20

## 2016-02-23 MED ORDER — TRAMADOL HCL 50 MG PO TABS: 50.0000 mg | ORAL_TABLET | Freq: Four times a day (QID) | ORAL | 0 refills | Status: DC | PRN

## 2016-02-23 NOTE — Progress Notes (Signed)
ID: [name]   DOB: Aug 20, 1970  MR#: [id number]  ZJQ#:[id number]   PCP: Lowella Dell, MD GYN: Coral Ceo MD SU: Claud Kelp MD OTHER MD: Wayland Denis, Rocco Serene, Venancio Poisson  CHIEF COMPLAINT:  Recurrent Breast Cancer, Right  CURRENT THERAPY: Anastrozole   BREAST CANCER HISTORY:  From the original intake note:  "[name] (goes by "[name]") is a [place] woman who, at the age of 18, was referred by Dr. Coral Ceo for treatment of right breast carcinoma.   1.  The patient palpated a mass in her right breast which she brought to Dr. Verdell Carmine attention. A prior screening mammogram on 02/06/2010 had been unremarkable. The patient was then scheduled for bilateral diagnostic mammogram and right breast ultrasonography on 12/22/2010, the studies demonstrating an ill defined mass with heterogeneous calcifications in the inner lower right breast, measuring 1.7 cm. The breasts are heterogeneously dense, but there were no other mammographic abnormalities in either breast. Clinically, there was a palpable firm mass at the 5:00 position of the right breast, 5 cm from the nipple. There were no palpable abnormalities in the right axilla. Ultrasound confirmed a 1.7 cm irregular, hypoechoic mass in the same position. There were some lymph nodes noted in the right exam of, one measuring 1.6 cm with a minimally thickened cortex inferiorly.  An ultrasound-guided core biopsy on 12/23/2010 [phone]) showed an invasive ductal carcinoma, grade 2, ER positive at 88%, PR positive at 11 is %, HER-2/neu positive with a FISH ratio of 4.61, and an MIB-1 of 36%.  The patient was referred to Dr. Claud Kelp and bilateral breast MRIs were obtained 12/30/2010. This showed the mass in question in the right breast to measure 2.0 cm. In the right axilla there was a 1.8 cm lymph node with a thickened cortex. The left breast was unremarkable.  Her subsequent treatment is as detailed  below.  2.   Joby then had a restaging PET scan late December 2014 which was positive only for a small subcutaneous nodule in the right breast lower outer quadrant. There was no other finding of concern. Ultrasound of both breasts 04/08/2013 showed a nodule in the right breast at the 4:00 position measuring 0.7 cm. The right axilla was clear. There was an area at the 7:00 position of the left breast which was of concern by palpation but showed only normal tissue.   Biopsy of the right breast mass 04/09/2013 showed an invasive ductal carcinoma, grade 2, estrogen receptor 94% positive, progesterone receptor 11% positive, with an MIB-1 of 19% and HER-2 amplified with a signals ratio of 3.75 and the number per cell of 4.50. The patient discussed the situation with with her surgeon's and on 05/05/2013 proceeded to right lumpectomy, with the final pathology (S. is a [id number]) confirming an invasive ductal carcinoma, 0.7 cm, grade 2, within a millimeter of the posterior margin, which showed skeletal muscle. A port was placed at the time of that procedure."  Her subsequent treatment is as detailed below.  INTERVAL HISTORY:  [name] returns today for follow up of her estrogen receptor positive breast cancer accompanied by her husband [name]. [name] continues on anastrozole, with fair tolerance. She thinks it may make her a little bit fatigued. Sometimes she feels achy and she takes Excedrin for that or does some stretches.    REVIEW OF SYSTEMS:  [name] has headaches frequently. They're almost daily. There are [place] and they can wake her up. She also has back pain. This  is very intermittent. This is worse with sitting and better with walking. Sometimes she has a weird feeling in her head like a pressure all over. She denies nausea vomiting or visual changes. She has signed breaths at times. This worries her husband. She was at her son's ball game the other day and she felt she might faint. She also has pain  in her left chest for the port used to be. She has some tramadol at home which she rarely uses because of constipation concerns. She has been pouring over her recent scans and had many questions related to the findings. A detailed review of systems today was otherwise stable  PAST MEDICAL HISTORY: Past Medical History:  Diagnosis Date  . Anxiety   . Asthma    as a child  . Breast cancer [facility]) Sept 2012   right breast, inner lower right  . Cervical dysplasia   . GERD (gastroesophageal reflux disease)    had during chemo  . Hx of radiation therapy 11/24/13- 01/08/14   reconstructed right breast/chest wall 4500 cGy 25 sessions, electron beam boost 1440 cGy 8 sessions  . Kidney stones   . Migraines   . Pneumonia   . PONV (postoperative nausea and vomiting)   . Rash    neck  1. History of prior adenoidectomy. 2. History of rhinoplasty.   3. History of multiple uterine cervical treatments for precancerous lesions, none for the past 10 years or so. 4. History of fibroid tumor surgery. 5. History of D and C. 6. History of dysplastic nevi removed by Dr. Ronnald Ramp. 7. History of right "lazy eye" corrective surgery. 8. History of approximately 10-pack-years of tobacco abuse, resolved.  9. History of possible migraines (morning headaches). History of asthma as a child.  PAST SURGICAL HISTORY: Past Surgical History:  Procedure Laterality Date  . ADENOIDECTOMY    . BREAST LUMPECTOMY WITH NEEDLE LOCALIZATION Right 05/05/2013   Procedure: EXCISION RECURRENT CANCER RIGHT BREAST WITH NEEDLE LOCALOZATION;  Surgeon: Adin Hector, MD;  Location: [place];  Service: General;  Laterality: Right;  . BREAST RECONSTRUCTION  06/29/2011   Procedure: BREAST RECONSTRUCTION;  Surgeon: Theodoro Kos, DO;  Location: [place];  Service: Plastics;  Laterality: Bilateral;  Immediate Bilateral Breast Reconstruction with Bilateral Tissue Expanders and placement of  Alloderm   . DILATION AND CURETTAGE OF UTERUS    . fibroid  tumor surgery    . lazy eye     corrective surgery  . MASTECTOMY W/ SENTINEL NODE BIOPSY  06/29/2011   Procedure: MASTECTOMY WITH SENTINEL LYMPH NODE BIOPSY;  Surgeon: Adin Hector, MD;  Location: [place];  Service: General;  Laterality: Right;  right skin spairing mstectomy and right sentinel lymph node biopsy  . PORT-A-CATH REMOVAL  03/12/2012   Procedure: MINOR REMOVAL PORT-A-CATH;  Surgeon: Adin Hector, MD;  Location: [place];  Service: General;  Laterality: N/A;  Upper left  . PORTACATH PLACEMENT Left 05/05/2013   Procedure: INSERTION PORT-A-CATH;  Surgeon: Adin Hector, MD;  Location: [place];  Service: General;  Laterality: Left;  . RHINOPLASTY    . uterine cervical treatments  2002   for precancerous lesions    FAMILY HISTORY Family History  Problem Relation Age of Onset  . Adopted: Yes  . Heart attack Father   . Cancer Cousin 40    melanoma; mat first cousin, located on neck  . Cancer Maternal Aunt 60    stomach and ovarian as separate primaries  . Cancer  Maternal Uncle 65    lung; smoker  . Cancer Maternal Grandmother 38    enodometrial  . Cancer Maternal Aunt 55    breast  . Cancer Maternal Uncle 63    prostate  :  The patient's biological father died at the age of 24 from myocardial infarction.  The patient's biological mother is alive at age 50.  The patient was adopted by her biological mother's parents.  She has 3 half-brothers.  No sisters.  One cousin on her mother's side died from melanoma at the age of 21.  There is other cancer history in the adopted side but these are not even half-brothers or sisters, they are children of her adopted parents who are really her grandparents so these would be uncles and aunts (1 lymph node cancer, 1 prostate cancer, 1 breast cancer at the age of 49).  GYNECOLOGIC HISTORY:   (Updated 05/22/2013) She had menarche age 15 or 42.  She is GX, P1, first pregnancy to term at age 24. She stopped having periods  approximately March 2013.  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  Her husband, Mackenize Delgadillo owns a family business called [employer].  They have a son, Kellie Simmering, age 68. Romilda Joy also has a daughter, Brookley Spitler, who lives in [place] and had twins in 2016.  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  The patient is not a church attender.    ADVANCED DIRECTIVES: Not in place  HEALTH MAINTENANCE:  (Updated 06/12/2013) Social History  Substance Use Topics  . Smoking status: Former Smoker    Quit date: 07/20/1996  . Smokeless tobacco: Never Used  . Alcohol use No     Colonoscopy:  Never  PAP: Oct 2014/[facility]  Bone density: Oct 2014, mild osteopenia with a T score of -1.1  Lipid panel: Not on file  Allergies  Allergen Reactions  . Tussionex Pennkinetic Er [Hydrocod Polst-Cpm Polst Er] Other (See Comments)    hallucinations  . Codeine Other (See Comments)    unkown  . Morphine Rash  . Morphine And Related Anxiety    anxiety  . Penicillin G Rash  . Penicillins Rash    Current Outpatient Prescriptions  Medication Sig Dispense Refill  . anastrozole (ARIMIDEX) 1 MG tablet Take 1 tablet (1 mg total) by mouth daily. 90 tablet 4  . aspirin-acetaminophen-caffeine (EXCEDRIN MIGRAINE) 154-008-67 MG per tablet Take 2 tablets by mouth every 6 (six) hours as needed for headache.    . Biotin 5000 MCG CAPS Take 5,000 mcg by mouth daily.    . calcium citrate-vitamin D (CITRACAL+D) 315-200 MG-UNIT tablet Take 1 tablet by mouth 2 (two) times daily.    [name] LORazepam (ATIVAN) 0.5 MG tablet Take 1 tablet (0.5 mg total) by mouth 2 (two) times daily as needed. for anxiety 60 tablet 0  . Prenatal Vit-Fe Fumarate-FA (PRENATAL VITAMIN PLUS LOW IRON) 27-1 MG TABS TAKE ONE TABLET BY  MOUTH ONCE DAILY 90 tablet 3  . traMADol (ULTRAM) 50 MG tablet Take 1 tablet (50 mg total) by mouth every 6 (six) hours as needed. for pain 30 tablet 0   No current facility-administered medications for this visit.     OBJECTIVE: Middle-aged white woman In no acute distress 001 Vitals:   02/23/16 1333  BP:  103/80  Pulse: 62  Resp: 17  Temp: 97.5 F (36.4 C)     Body mass index is 22.13 kg/m.    ECOG FS: 1 Filed Weights   02/23/16 1333  Weight: 124 lb 14.4 oz (56.7 kg)   Sclerae unicteric, EOMs intact Oropharynx clear and moist No cervical or supraclavicular adenopathy Lungs no rales or rhonchi Heart regular rate and rhythm Abd soft, nontender, positive bowel sounds MSK no focal spinal tenderness, no upper extremity lymphedema Neuro: nonfocal, well oriented, appropriate affect Breasts: Status post bilateral mastectomies. The cosmetic reconstruction results are good. Both axillae are benign.   LAB RESULTS: Lab Results  Component Value Date   WBC 5.3 02/09/2016   NEUTROABS 3.5 02/09/2016   HGB 13.9 02/09/2016   HCT 42.4 02/09/2016   MCV 91.5 02/09/2016   PLT 230 02/09/2016      Chemistry      Component Value Date/Time   NA 143 02/09/2016 1325   K 4.2 02/09/2016 1325   CL 103 01/08/2014 1459   CL 104 09/23/2012 1415   CO2 28 02/09/2016 1325   BUN 14.0 02/09/2016 1325   CREATININE 0.9 02/09/2016 1325      Component Value Date/Time   CALCIUM 9.8 02/09/2016 1325   ALKPHOS 96 02/09/2016 1325   AST 29 02/09/2016 1325   ALT 27 02/09/2016 1325   BILITOT 0.68 02/09/2016 1325      STUDIES: CLINICAL DATA:  Recurrent right breast cancer.  Nausea.  EXAM: CT CHEST, ABDOMEN, AND PELVIS WITH CONTRAST  TECHNIQUE: Multidetector CT imaging of the chest, abdomen and pelvis was performed following the standard protocol during bolus administration of intravenous contrast.  CONTRAST:  ISOVUE-300 IOPAMIDOL (ISOVUE-300) INJECTION 61%  COMPARISON:  Nuclear  medicine PET-CT from 04/07/2014  FINDINGS: CT CHEST FINDINGS  Cardiovascular: Unremarkable  Mediastinum/Nodes: Unremarkable  Lungs/Pleura: Minimal biapical pleuroparenchymal scarring. Findings related to radiation therapy anteriorly in the right upper lobe. No findings of metastatic disease to the lungs.  Musculoskeletal: Right axillary clips. Bilateral breast implants. No findings of osseous metastatic disease in the chest.  CT ABDOMEN PELVIS FINDINGS  Hepatobiliary: In the dome of segment 7 there is a 5 mm enhancing focus on image 51/2. The no obvious abnormality in this area on prior exams such as PET-CT of 04/07/2014 or CT chest from 01/12/2011, although admittedly this is a small finding and the prior CT chest was obtained at a slightly different phase of contrast.  The liver and gallbladder appear otherwise normal.  Pancreas: Unremarkable  Spleen: Unremarkable  Adrenals/Urinary Tract: Adrenal glands normal. 2 mm left kidney nonobstructive renal calculus, image 73/602.  Stomach/Bowel: Unremarkable  Vascular/Lymphatic: No significant vascular abnormality. Scattered small mesenteric lymph nodes but no pathologic adenopathy.  Reproductive: Retroverted uterus.  Otherwise unremarkable.  Other: No supplemental non-categorized findings.  Musculoskeletal: Unremarkable  IMPRESSION: 1. The is a 5 mm peripheral focus of enhancement in the dome of segment 7 of the liver. The most likely causes, such as a small vascular shunt, small hemangioma, focal nodular hyperplasia, or other benign lesions, are favored over this being an early metastatic lesion. There are several possibilities for further imaging workup. One possibility would be to perform hepatic protocol MRI with and without contrast canal, to gain more multiphasic insight into this tiny lesion. That might be adequate to specifically characterize the lesion, but might also leave [place] with  a nonspecific lesion in need for follow up. Alternatively, we could follow up in 3-6 months time with hepatic protocol CT  or MRI, which would allow [place] to assess for any change in size in addition to lesion characteristics. Either choice is reasonable. This lesion is low too small to reliably show up on a PET-CT, and is too small to biopsy. 2. 2 mm left kidney nonobstructive renal calculus.   Electronically Signed   By: Van Clines M.D.   On: 12/29/2015 16:22   ASSESSMENT: 45 y.o.  BRCA 1-2 negative [name], [place] woman,   (1) status post right breast lower inner quadrant biopsy in September 2012 for a clinically 2.0 cm invasive ductal carcinoma, with some enlarged Right axillary lymph nodes, the largest one of which was negative by biopsy. The tumor was grade 2, estrogen receptor positive at 88%, progesterone receptor positive at 11%, HER-2/neu positive by CISH with a ratio of 4.61, and a proliferation marker of 36%.   (2) treated in the neoadjuvant setting with Q 3 week docetaxel/ carboplatin/ trastuzumab x6, completed 05/18/2011.  Continued on trastuzumab every 3 weeks to complete one year, last dose in November 2013.  (3) s/p bilateral mastectomies with sentinel node biopsy 06/29/2011 for a residual right-sided ypT1c ypN0 invasive ductal carcinoma, grade 2, with immediate expander placement  (4) on tamoxifen as of May 2013, discontinued in early 2015 due to breast cancer recurrence   RECURRENT BREAST CANCER January 2015 (5) status post right lumpectomy with right-sided sentinel lymph node sampling 05/05/2013 for a pT1b cN0, stage IA invasive ductal carcinoma, estrogen receptor 94% positive, progesterone receptor 11% positive, with an MIB-1 of 19% and HER-2 amplification  (6)  Received TDM-1 every 3 weeks x 8 doses completed 10/09/2013; echo 08/25/2013 showed a well preserved ejection fraction  (7) adjuvant radiation therapy completed 01/08/2014  (8) started anastrozole  04/10/2014   (a) [place] and estradiol in menopausal range 02/04/2014, to be followed every 3 months  (b) bone density 01/15/2013 showed a T score of -1.1  (c) switched to letrozole 07/07/2014, discontinued June 2016 because of arthralgias/myalgias  (d) started exemestane September 2016, stopped March 2017 because of cost issues  (e) bone density 06/17/2015 showed a T score of -1.5   (f) anastrozole resumed April 2017  (9) genetic testing (23 genes associated with hereditary ovarian cancer through [facility], April 2015) 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   PLAN:  I spent well in excess of 40 minutes today with [place] going over her situation. We reviewed her scans practically word foreword which means a lot of the medical terminology had to be translated. She had many misunderstandings and it was very important to correct disease.  Once we went through that we reviewed the fact that she is now nearly 3 years out from her recurrence with no evidence of disease activity. This is very favorable.  We also reviewed her [place] and estradiol levels which show she is clearly now postmenopausal. This was well received.  I think it would be prudent to obtain a brain MRI given her frequent headaches although I am hopeful these will be secondary to other problems such as cyanosis. It would be also useful to have an MRI of the liver to clarify that what we are dealing with there is stable and hopefully benign. These tests have been requested.   I will see her again in early January, shortly after the MRIs. From that point we may be able to per on the follow-up interval to every 6 months.   Chauncey Cruel, MD  02/24/2016 

## 2016-03-29 ENCOUNTER — Ambulatory Visit (HOSPITAL_COMMUNITY)
Admission: RE | Admit: 2016-03-29 | Discharge: 2016-03-29 | Disposition: A | Discharge status: Home / Self Care | Payer: BLUE CROSS/BLUE SHIELD | Source: Ambulatory Visit | Attending: Oncology | Admitting: Oncology

## 2016-03-29 DIAGNOSIS — C50311 Malignant neoplasm of lower-inner quadrant of right female breast: Secondary | ICD-10-CM | POA: Diagnosis present

## 2016-03-29 DIAGNOSIS — C50911 Malignant neoplasm of unspecified site of right female breast: Secondary | ICD-10-CM

## 2016-03-29 DIAGNOSIS — Z170 Estrogen receptor positive status [ER+]: Secondary | ICD-10-CM | POA: Insufficient documentation

## 2016-03-29 MED ORDER — GADOBENATE DIMEGLUMINE 529 MG/ML IV SOLN
11.0000 mL | Freq: Once | INTRAVENOUS | Status: AC | PRN
  Administered 2016-03-29: 11 mL via INTRAVENOUS

## 2016-04-04 ENCOUNTER — Other Ambulatory Visit: Payer: Self-pay | Admitting: Nurse Practitioner

## 2016-04-05 ENCOUNTER — Ambulatory Visit (HOSPITAL_COMMUNITY)
Admission: RE | Admit: 2016-04-05 | Discharge: 2016-04-05 | Disposition: A | Discharge status: Home / Self Care | Payer: BLUE CROSS/BLUE SHIELD | Source: Ambulatory Visit | Attending: Oncology | Admitting: Oncology

## 2016-04-05 ENCOUNTER — Other Ambulatory Visit (HOSPITAL_BASED_OUTPATIENT_CLINIC_OR_DEPARTMENT_OTHER): Payer: BLUE CROSS/BLUE SHIELD

## 2016-04-05 ENCOUNTER — Other Ambulatory Visit (HOSPITAL_COMMUNITY): Payer: BLUE CROSS/BLUE SHIELD

## 2016-04-05 DIAGNOSIS — Z170 Estrogen receptor positive status [ER+]: Secondary | ICD-10-CM | POA: Insufficient documentation

## 2016-04-05 DIAGNOSIS — C50311 Malignant neoplasm of lower-inner quadrant of right female breast: Secondary | ICD-10-CM | POA: Diagnosis not present

## 2016-04-05 DIAGNOSIS — C50911 Malignant neoplasm of unspecified site of right female breast: Secondary | ICD-10-CM

## 2016-04-05 DIAGNOSIS — K769 Liver disease, unspecified: Secondary | ICD-10-CM | POA: Insufficient documentation

## 2016-04-05 LAB — CBC WITH DIFFERENTIAL/PLATELET
BASO%: 0.4 % (ref 0.0–2.0)
Basophils Absolute: 0 10*3/uL (ref 0.0–0.1)
EOS%: 1.5 % (ref 0.0–7.0)
Eosinophils Absolute: 0.1 10*3/uL (ref 0.0–0.5)
HCT: 40.6 % (ref 34.8–46.6)
HGB: 13.7 g/dL (ref 11.6–15.9)
LYMPH%: 21.8 % (ref 14.0–49.7)
MCH: 31 pg (ref 25.1–34.0)
MCHC: 33.7 g/dL (ref 31.5–36.0)
MCV: 91.8 fL (ref 79.5–101.0)
MONO#: 0.4 10*3/uL (ref 0.1–0.9)
MONO%: 5.7 % (ref 0.0–14.0)
NEUT#: 5.2 10*3/uL (ref 1.5–6.5)
NEUT%: 70.6 % (ref 38.4–76.8)
Platelets: 248 10*3/uL (ref 145–400)
RBC: 4.43 10*6/uL (ref 3.70–5.45)
RDW: 12.9 % (ref 11.2–14.5)
WBC: 7.4 10*3/uL (ref 3.9–10.3)
lymph#: 1.6 10*3/uL (ref 0.9–3.3)

## 2016-04-05 LAB — COMPREHENSIVE METABOLIC PANEL
ALT: 20 U/L (ref 0–55)
AST: 28 U/L (ref 5–34)
Albumin: 4 g/dL (ref 3.5–5.0)
Alkaline Phosphatase: 88 U/L (ref 40–150)
Anion Gap: 9 mEq/L (ref 3–11)
BUN: 13.2 mg/dL (ref 7.0–26.0)
CO2: 27 mEq/L (ref 22–29)
Calcium: 9.8 mg/dL (ref 8.4–10.4)
Chloride: 107 mEq/L (ref 98–109)
Creatinine: 0.9 mg/dL (ref 0.6–1.1)
EGFR: 78 mL/min/{1.73_m2} — ABNORMAL LOW (ref >90)
Glucose: 84 mg/dl (ref 70–140)
Potassium: 4.3 mEq/L (ref 3.5–5.1)
Sodium: 143 mEq/L (ref 136–145)
Total Bilirubin: 0.51 mg/dL (ref 0.20–1.20)
Total Protein: 6.7 g/dL (ref 6.4–8.3)

## 2016-04-05 MED ORDER — GADOBENATE DIMEGLUMINE 529 MG/ML IV SOLN
11.0000 mL | Freq: Once | INTRAVENOUS | Status: AC | PRN
  Administered 2016-04-05: 11 mL via INTRAVENOUS

## 2016-04-09 ENCOUNTER — Other Ambulatory Visit: Payer: Self-pay | Admitting: Oncology

## 2016-04-19 ENCOUNTER — Ambulatory Visit (HOSPITAL_BASED_OUTPATIENT_CLINIC_OR_DEPARTMENT_OTHER): Payer: BLUE CROSS/BLUE SHIELD | Admitting: Oncology

## 2016-04-19 VITALS — BP 98/73 | HR 70 | Temp 98.6°F | Resp 16 | Ht 63.0 in | Wt 127.6 lb

## 2016-04-19 DIAGNOSIS — C50911 Malignant neoplasm of unspecified site of right female breast: Secondary | ICD-10-CM | POA: Diagnosis not present

## 2016-04-19 DIAGNOSIS — Z170 Estrogen receptor positive status [ER+]: Secondary | ICD-10-CM | POA: Diagnosis not present

## 2016-04-19 DIAGNOSIS — Z79811 Long term (current) use of aromatase inhibitors: Secondary | ICD-10-CM | POA: Diagnosis not present

## 2016-04-19 DIAGNOSIS — C50311 Malignant neoplasm of lower-inner quadrant of right female breast: Secondary | ICD-10-CM | POA: Diagnosis not present

## 2016-04-19 MED ORDER — TRAMADOL HCL 50 MG PO TABS: 50.0000 mg | ORAL_TABLET | Freq: Four times a day (QID) | ORAL | 0 refills | Status: DC | PRN

## 2016-04-19 NOTE — Progress Notes (Signed)
ID: [name]   DOB: February 09, 1971  MR#: [id number]  UDJ#:[id number]   PCP: Chauncey Cruel, MD GYN: Baltazar Najjar MD SU: Fanny Skates MD OTHER MD: Theodoro Kos, [name], [name]  CHIEF COMPLAINT:  Recurrent Breast Cancer, Right  CURRENT THERAPY: Anastrozole   BREAST CANCER HISTORY:  From the original intake note:  "[name] (goes by "[name]") is a [place] woman who, at the age of 46, was referred by Dr. Baltazar Najjar for treatment of right breast carcinoma.   1.  The patient palpated a mass in her right breast which she brought to Dr. Jacelyn Grip attention. A prior screening mammogram on 02/06/2010 had been unremarkable. The patient was then scheduled for bilateral diagnostic mammogram and right breast ultrasonography on 12/22/2010, the studies demonstrating an ill defined mass with heterogeneous calcifications in the inner lower right breast, measuring 1.7 cm. The breasts are heterogeneously dense, but there were no other mammographic abnormalities in either breast. Clinically, there was a palpable firm mass at the 5:00 position of the right breast, 5 cm from the nipple. There were no palpable abnormalities in the right axilla. Ultrasound confirmed a 1.7 cm irregular, hypoechoic mass in the same position. There were some lymph nodes noted in the right exam of, one measuring 1.6 cm with a minimally thickened cortex inferiorly.  An ultrasound-guided core biopsy on 12/23/2010 [phone]) showed an invasive ductal carcinoma, grade 2, ER positive at 88%, PR positive at 11 is %, HER-2/neu positive with a FISH ratio of 4.61, and an MIB-1 of 36%.  The patient was referred to Dr. Fanny Skates and bilateral breast MRIs were obtained 12/30/2010. This showed the mass in question in the right breast to measure 2.0 cm. In the right axilla there was a 1.8 cm lymph node with a thickened cortex. The left breast was unremarkable.  Her subsequent treatment is as detailed  below.  2.   [name] then had a restaging PET scan late December 2014 which was positive only for a small subcutaneous nodule in the right breast lower outer quadrant. There was no other finding of concern. Ultrasound of both breasts 04/08/2013 showed a nodule in the right breast at the 4:00 position measuring 0.7 cm. The right axilla was clear. There was an area at the 7:00 position of the left breast which was of concern by palpation but showed only normal tissue.   Biopsy of the right breast mass 04/09/2013 showed an invasive ductal carcinoma, grade 2, estrogen receptor 94% positive, progesterone receptor 11% positive, with an MIB-1 of 19% and HER-2 amplified with a signals ratio of 3.75 and the number per cell of 4.50. The patient discussed the situation with with her surgeon's and on 05/05/2013 proceeded to right lumpectomy, with the final pathology (S. is a [id number]) confirming an invasive ductal carcinoma, 0.7 cm, grade 2, within a millimeter of the posterior margin, which showed skeletal muscle. A port was placed at the time of that procedure."  Her subsequent treatment is as detailed below.  INTERVAL HISTORY:  [name]   returns today for follow-up of her estrogen receptor positive breast cancer. She continues on anastrozole. She still has some hot flashes, but they are "not that bad anymore". Vaginal dryness is not a significant concern. She is exercising chiefly by walking mostly outside, but she does not do this very regularly, perhaps once a week. She tells me she just got herself a yoga Matt and she is going to be giving that a try.  REVIEW OF SYSTEMS:  [name] is getting a variety of detox the finding treatments through in [place] alternatives treatment counselor and she says one of them made her very itchy. We discussed the fact that frequently there are many different ingredients in some of these preparations and it will be very difficult for her to identify which one in particular  was the cause. She wanted to know what I suggested for detox at vacation and I strongly suggested exercise, 45 minutes 5 times a week, preferably vigorous in up to cause sweating area did aside from these issues, a detailed review of systems today was stable   PAST MEDICAL HISTORY: Past Medical History:  Diagnosis Date  . Anxiety   . Asthma    as a child  . Breast cancer [facility]) Sept 2012   right breast, inner lower right  . Cervical dysplasia   . GERD (gastroesophageal reflux disease)    had during chemo  . Hx of radiation therapy 11/24/13- 01/08/14   reconstructed right breast/chest wall 4500 cGy 25 sessions, electron beam boost 1440 cGy 8 sessions  . Kidney stones   . Migraines   . Pneumonia   . PONV (postoperative nausea and vomiting)   . Rash    neck  1. History of prior adenoidectomy. 2. History of rhinoplasty.   3. History of multiple uterine cervical treatments for precancerous lesions, none for the past 10 years or so. 4. History of fibroid tumor surgery. 5. History of D and C. 6. History of dysplastic nevi removed by Dr. Ronnald Ramp. 7. History of right "lazy eye" corrective surgery. 8. History of approximately 10-pack-years of tobacco abuse, resolved.  9. History of possible migraines (morning headaches). History of asthma as a child.  PAST SURGICAL HISTORY: Past Surgical History:  Procedure Laterality Date  . ADENOIDECTOMY    . BREAST LUMPECTOMY WITH NEEDLE LOCALIZATION Right 05/05/2013   Procedure: EXCISION RECURRENT CANCER RIGHT BREAST WITH NEEDLE LOCALOZATION;  Surgeon: Adin Hector, MD;  Location: [place];  Service: General;  Laterality: Right;  . BREAST RECONSTRUCTION  06/29/2011   Procedure: BREAST RECONSTRUCTION;  Surgeon: Theodoro Kos, DO;  Location: [place];  Service: Plastics;  Laterality: Bilateral;  Immediate Bilateral Breast Reconstruction with Bilateral Tissue Expanders and placement of  Alloderm   . DILATION AND CURETTAGE OF UTERUS    . fibroid tumor surgery     . lazy eye     corrective surgery  . MASTECTOMY W/ SENTINEL NODE BIOPSY  06/29/2011   Procedure: MASTECTOMY WITH SENTINEL LYMPH NODE BIOPSY;  Surgeon: Adin Hector, MD;  Location: [place];  Service: General;  Laterality: Right;  right skin spairing mstectomy and right sentinel lymph node biopsy  . PORT-A-CATH REMOVAL  03/12/2012   Procedure: MINOR REMOVAL PORT-A-CATH;  Surgeon: Adin Hector, MD;  Location: [place];  Service: General;  Laterality: N/A;  Upper left  . PORTACATH PLACEMENT Left 05/05/2013   Procedure: INSERTION PORT-A-CATH;  Surgeon: Adin Hector, MD;  Location: [place];  Service: General;  Laterality: Left;  . RHINOPLASTY    . uterine cervical treatments  2002   for precancerous lesions    FAMILY HISTORY Family History  Problem Relation Age of Onset  . Adopted: Yes  . Heart attack Father   . Cancer Cousin 40    melanoma; mat first cousin, located on neck  . Cancer Maternal Aunt 60    stomach and ovarian as separate primaries  . Cancer Maternal Uncle 21  lung; smoker  . Cancer Maternal Grandmother 11    enodometrial  . Cancer Maternal Aunt 55    breast  . Cancer Maternal Uncle 63    prostate  :  The patient's biological father died at the age of 59 from myocardial infarction.  The patient's biological mother is alive at age 72.  The patient was adopted by her biological mother's parents.  She has 3 half-brothers.  No sisters.  One cousin on her mother's side died from melanoma at the age of 51.  There is other cancer history in the adopted side but these are not even half-brothers or sisters, they are children of her adopted parents who are really her grandparents so these would be uncles and aunts (1 lymph node cancer, 1 prostate cancer, 1 breast cancer at the age of 56).  GYNECOLOGIC HISTORY:   (Updated 05/22/2013) She had menarche age 71 or 14.  She is GX, P1, first pregnancy to term at age 73. She stopped having periods approximately March  2013.  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  Her husband, Suhani Stillion owns a family business called [employer].  They have a son, [name], age 65. [name] also has a daughter, [name], who lives in [place] and had twins in 2016.  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  The patient is not a church attender.    ADVANCED DIRECTIVES: Not in place  HEALTH MAINTENANCE:  (Updated 06/12/2013) Social History  Substance Use Topics  . Smoking status: Former Smoker    Quit date: 07/20/1996  . Smokeless tobacco: Never Used  . Alcohol use No     Colonoscopy:  Never  PAP: Oct 2014/[facility]  Bone density: Oct 2014, mild osteopenia with a T score of -1.1  Lipid panel: Not on file  Allergies  Allergen Reactions  . Tussionex Pennkinetic Er [Hydrocod Polst-Cpm Polst Er] Other (See Comments)    hallucinations  . Codeine Other (See Comments)    unkown  . Morphine Rash  . Morphine And Related Anxiety    anxiety  . Penicillin G Rash  . Penicillins Rash    Current Outpatient Prescriptions  Medication Sig Dispense Refill  . anastrozole (ARIMIDEX) 1 MG tablet Take 1 tablet (1 mg total) by mouth daily. 90 tablet 4  . aspirin-acetaminophen-caffeine (EXCEDRIN MIGRAINE) 943-276-14 MG per tablet Take 2 tablets by mouth every 6 (six) hours as needed for headache.    . Biotin 5000 MCG CAPS Take 5,000 mcg by mouth daily.    . calcium citrate-vitamin D (CITRACAL+D) 315-200 MG-UNIT tablet Take 1 tablet by mouth 2 (two) times daily.    [name] LORazepam (ATIVAN) 0.5 MG tablet Take 1 tablet (0.5 mg total) by mouth 2 (two) times daily as needed. for anxiety 60 tablet 0  . Prenatal Vit-Fe Fumarate-FA (PRENATAL VITAMIN PLUS LOW IRON) 27-1 MG TABS TAKE ONE TABLET BY MOUTH ONCE DAILY 90  tablet 3  . traMADol (ULTRAM) 50 MG tablet Take 1 tablet (50 mg total) by mouth every 6 (six) hours as needed. for pain 30 tablet 0   No current facility-administered medications for this visit.     OBJECTIVE: Middle-aged white woman What appears well  001 Vitals:   04/19/16 1431  BP: 98/73  Pulse: 70  Resp:  16  Temp: 98.6 F (37 C)     Body mass index is 22.6 kg/m.    ECOG FS: 0 Filed Weights   04/19/16 1431  Weight: 127 lb 9.6 oz (57.9 kg)   Sclerae unicteric, pupils round and equal Oropharynx clear and moist-- no thrush or other lesions No cervical or supraclavicular adenopathy Lungs no rales or rhonchi Heart regular rate and rhythm Abd soft, nontender, positive bowel sounds MSK no focal spinal tenderness, no upper extremity lymphedema Neuro: nonfocal, well oriented, appropriate affect Breasts: Status post bilateral mastectomies with implant reconstruction. There is no evidence of local recurrence. Both axillae are benign.  LAB RESULTS: Lab Results  Component Value Date   WBC 7.4 04/05/2016   NEUTROABS 5.2 04/05/2016   HGB 13.7 04/05/2016   HCT 40.6 04/05/2016   MCV 91.8 04/05/2016   PLT 248 04/05/2016      Chemistry      Component Value Date/Time   NA 143 04/05/2016 1425   K 4.3 04/05/2016 1425   CL 103 01/08/2014 1459   CL 104 09/23/2012 1415   CO2 27 04/05/2016 1425   BUN 13.2 04/05/2016 1425   CREATININE 0.9 04/05/2016 1425      Component Value Date/Time   CALCIUM 9.8 04/05/2016 1425   ALKPHOS 88 04/05/2016 1425   AST 28 04/05/2016 1425   ALT 20 04/05/2016 1425   BILITOT 0.51 04/05/2016 1425      STUDIES: CLINICAL DATA:  Recurrent right breast cancer.  Nausea.  EXAM: CT CHEST, ABDOMEN, AND PELVIS WITH CONTRAST  TECHNIQUE: Multidetector CT imaging of the chest, abdomen and pelvis was performed following the standard protocol during bolus administration of intravenous contrast.  CONTRAST:  135m ISOVUE-300 IOPAMIDOL (ISOVUE-300)  INJECTION 61%  COMPARISON:  Nuclear medicine PET-CT from 04/07/2014  FINDINGS: CT CHEST FINDINGS  Cardiovascular: Unremarkable  Mediastinum/Nodes: Unremarkable  Lungs/Pleura: Minimal biapical pleuroparenchymal scarring. Findings related to radiation therapy anteriorly in the right upper lobe. No findings of metastatic disease to the lungs.  Musculoskeletal: Right axillary clips. Bilateral breast implants. No findings of osseous metastatic disease in the chest.  CT ABDOMEN PELVIS FINDINGS  Hepatobiliary: In the dome of segment 7 there is a 5 mm enhancing focus on image 51/2. The no obvious abnormality in this area on prior exams such as PET-CT of 04/07/2014 or CT chest from 01/12/2011, although admittedly this is a small finding and the prior CT chest was obtained at a slightly different phase of contrast.  The liver and gallbladder appear otherwise normal.  Pancreas: Unremarkable  Spleen: Unremarkable  Adrenals/Urinary Tract: Adrenal glands normal. 2 mm left kidney nonobstructive renal calculus, image 73/602.  Stomach/Bowel: Unremarkable  Vascular/Lymphatic: No significant vascular abnormality. Scattered small mesenteric lymph nodes but no pathologic adenopathy.  Reproductive: Retroverted uterus.  Otherwise unremarkable.  Other: No supplemental non-categorized findings.  Musculoskeletal: Unremarkable  IMPRESSION: 1. The is a 5 mm peripheral focus of enhancement in the dome of segment 7 of the liver. The most likely causes, such as a small vascular shunt, small hemangioma, focal nodular hyperplasia, or other benign lesions, are favored over this being an early metastatic lesion. There are several possibilities for further imaging workup. One possibility would be to perform hepatic protocol MRI with and without contrast canal, to gain more multiphasic insight into this tiny lesion. That might be adequate to specifically characterize the lesion,  but might also leave [place] a nonspecific lesion in need for follow up. Alternatively, we could follow up in 3-6 months time  with hepatic protocol CT or MRI, which would allow [place] to assess for any change in size in addition to lesion characteristics. Either choice is reasonable. This lesion is low too small to reliably show up on a PET-CT, and is too small to biopsy. 2. 2 mm left kidney nonobstructive renal calculus.   Electronically Signed   By: Van Clines M.D.   On: 12/29/2015 16:22  Mr Jeri Cos [place] Contrast  Result Date: 03/29/2016 CLINICAL DATA:  Recurrent breast cancer. EXAM: MRI HEAD WITHOUT AND WITH CONTRAST TECHNIQUE: Multiplanar, multiecho pulse sequences of the brain and surrounding structures were obtained without and with intravenous contrast. CONTRAST:  67m MULTIHANCE GADOBENATE DIMEGLUMINE 529 MG/ML IV SOLN COMPARISON:  MRI brain 06/20/2013 FINDINGS: Brain: No acute infarct, hemorrhage, or mass lesion is present. No significant extraaxial fluid collection is present. The ventricles are of normal size. No significant white matter disease is present. The brainstem and cerebellum are normal. There is no pathologic enhancement to suggest metastatic disease of the brain or meninges. Vascular: Flow is present in the major intracranial arteries. Skull and upper cervical spine: The skullbase is within normal limits. A small Tornwaldt cyst is stable. Craniocervical junction is within normal limits. The visualized upper cervical spine and clivus are within normal limits. Midline sagittal structures are unremarkable. Sinuses/Orbits: The paranasal sinuses are clear mastoid air cells are clear. The globes and orbits are within normal limits. IMPRESSION: 1. Negative MRI the brain without evidence for metastatic disease. Electronically Signed   By: CSan MorelleM.D.   On: 03/29/2016 17:34   Mr Liver W WMLContrast  Result Date: 04/05/2016 CLINICAL DATA:  Recurrent right breast  carcinoma. Abdominal pain for 2 months. Indeterminate liver lesion seen on recent CT. EXAM: MRI ABDOMEN WITHOUT AND WITH CONTRAST TECHNIQUE: Multiplanar multisequence MR imaging of the abdomen was performed both before and after the administration of intravenous contrast. CONTRAST:  13mMULTIHANCE GADOBENATE DIMEGLUMINE 529 MG/ML IV SOLN COMPARISON:  CT on 12/29/2015 and lumbar spine MRI on 03/11/2014 FINDINGS: Lower chest: No acute findings. Hepatobiliary: Tiny sub-cm hypervascular focus in the posterior dome of the right hepatic lobe seen on previous CT is not visualized on current exam, likely due to tiny size and location in the liver dome. No liver masses are identified on today's study. Gallbladder is unremarkable. No evidence of biliary ductal dilatation. Pancreas:  No mass or inflammatory changes. Spleen:  Within normal limits in size and appearance. Adrenals/Urinary Tract: No masses identified. No evidence of hydronephrosis. Stomach/Bowel: Visualized portions within the abdomen are unremarkable. Vascular/Lymphatic: No pathologically enlarged lymph nodes identified. No abdominal aortic aneurysm. Other:  None. Musculoskeletal: No suspicious bone lesions identified. Stable benign hemangioma in L4 vertebral body. IMPRESSION: Tiny sub-cm hypervascular lesion in posterior dome of right hepatic lobe is not visualized on car exam, likely due to its tiny size and location. Recommend continued attention on follow-up hepatic protocol CT without and with contrast. No other liver lesions or acute findings identified within the abdomen. Electronically Signed   By: JoEarle Gell.D.   On: 04/05/2016 17:08     ASSESSMENT: [age].o.  BRCA 1-2 negative [name], [place] woman,   (1) status post right breast lower inner quadrant biopsy in September 2012 for a clinically 2.0 cm invasive ductal carcinoma, with some enlarged Right axillary lymph nodes, the largest one of which was negative by biopsy. The tumor was grade 2,  estrogen receptor positive at 88%, progesterone receptor positive at 11%, HER-2/neu positive by CISH with  a ratio of 4.61, and a proliferation marker of 36%.   (2) treated in the neoadjuvant setting with Q 3 week docetaxel/ carboplatin/ trastuzumab x6, completed 05/18/2011.  Continued on trastuzumab every 3 weeks to complete one year, last dose in November 2013.  (3) s/p bilateral mastectomies with sentinel node biopsy 06/29/2011 for a residual right-sided ypT1c ypN0 invasive ductal carcinoma, grade 2, with immediate expander placement  (4) on tamoxifen as of May 2013, discontinued in early 2015 due to breast cancer recurrence   RECURRENT BREAST CANCER January 2015 (5) status post right lumpectomy with right-sided sentinel lymph node sampling 05/05/2013 for a pT1b cN0, stage IA invasive ductal carcinoma, estrogen receptor 94% positive, progesterone receptor 11% positive, with an MIB-1 of 19% and HER-2 amplification  (6)  Received TDM-1 every 3 weeks x 8 doses completed 10/09/2013; echo 08/25/2013 showed a well preserved ejection fraction  (7) adjuvant radiation therapy completed 01/08/2014  (8) started anastrozole 04/10/2014   (a) [place] and estradiol in menopausal range 02/04/2014, to be followed every 3 months  (b) bone density 01/15/2013 showed a T score of -1.1  (c) switched to letrozole 07/07/2014, discontinued June 2016 because of arthralgias/myalgias  (d) started exemestane September 2016, stopped March 2017 because of cost issues  (e) bone density 06/17/2015 showed a T score of -1.5   (f) anastrozole resumed April 2017  (9) genetic testing (23 genes associated with hereditary ovarian cancer through [facility], April 2015) 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   PLAN:  I spent approximately 35 minutes with [place] today discussing her multiple questions. She relates all her reports very carefully, under lines the words she does not  understand, and then looks him up in the Internet. This leads to a significant number of questions which we did try to all clear up today.  The bottom line however is that she is now 3 years out from her last surgery for her breast cancer, with no evidence of disease recurrence. This is very favorable.  We reviewed the results of her MRI of the liver and the plan will be to repeat this sometime this fall to make sure that we are not dealing with anything emergent. Of course we will continue to follow her lab work closely.  She is exploring alternative/complementary medications or treatments and I explained that I am not well versed in that. I suggested she consider the integrity of Center associated with [facility] if she wishes to explore that in a more formal way. I did review the medicines that she brought me to look at and I don't see anything obvious in terms of possibly causing her harm or interfering with her anti-estrogens.  She is going to return to see me in September. She knows to call for any problems that may develop before that visit.  Chauncey Cruel, MD    04/19/2016

## 2016-04-20 MED ORDER — ANASTROZOLE 1 MG PO TABS: 1.0000 mg | ORAL_TABLET | Freq: Every day | ORAL | 4 refills | Status: DC

## 2016-07-31 ENCOUNTER — Other Ambulatory Visit: Payer: Self-pay | Admitting: Oncology

## 2016-08-02 ENCOUNTER — Encounter: Payer: Self-pay | Admitting: Obstetrics

## 2016-08-02 ENCOUNTER — Ambulatory Visit (INDEPENDENT_AMBULATORY_CARE_PROVIDER_SITE_OTHER): Payer: BLUE CROSS/BLUE SHIELD | Admitting: Obstetrics

## 2016-08-02 VITALS — BP 101/65 | HR 74 | Wt 130.0 lb

## 2016-08-02 DIAGNOSIS — Z3202 Encounter for pregnancy test, result negative: Secondary | ICD-10-CM | POA: Diagnosis not present

## 2016-08-02 DIAGNOSIS — Z1151 Encounter for screening for human papillomavirus (HPV): Secondary | ICD-10-CM | POA: Diagnosis not present

## 2016-08-02 DIAGNOSIS — M62838 Other muscle spasm: Secondary | ICD-10-CM

## 2016-08-02 DIAGNOSIS — Z01419 Encounter for gynecological examination (general) (routine) without abnormal findings: Secondary | ICD-10-CM | POA: Diagnosis not present

## 2016-08-02 DIAGNOSIS — Z124 Encounter for screening for malignant neoplasm of cervix: Secondary | ICD-10-CM

## 2016-08-02 DIAGNOSIS — N760 Acute vaginitis: Secondary | ICD-10-CM

## 2016-08-02 DIAGNOSIS — Z113 Encounter for screening for infections with a predominantly sexual mode of transmission: Secondary | ICD-10-CM | POA: Diagnosis not present

## 2016-08-02 DIAGNOSIS — Z0000 Encounter for general adult medical examination without abnormal findings: Secondary | ICD-10-CM

## 2016-08-02 LAB — POCT URINE PREGNANCY: Preg Test, Ur: NEGATIVE

## 2016-08-02 MED ORDER — PRENATAL VITAMIN PLUS LOW IRON 27-1 MG PO TABS: 1.0000 | ORAL_TABLET | Freq: Every day | ORAL | 3 refills | Status: DC

## 2016-08-02 MED ORDER — IBUPROFEN 800 MG PO TABS: 800.0000 mg | ORAL_TABLET | Freq: Three times a day (TID) | ORAL | 5 refills | Status: DC | PRN

## 2016-08-02 MED ORDER — LIDOCAINE-PRILOCAINE 2.5-2.5 % EX CREA: 1.0000 "application " | TOPICAL_CREAM | Freq: Two times a day (BID) | CUTANEOUS | 2 refills | Status: DC | PRN

## 2016-08-02 MED ORDER — CYCLOBENZAPRINE HCL 10 MG PO TABS: 10.0000 mg | ORAL_TABLET | Freq: Three times a day (TID) | ORAL | 2 refills | Status: DC | PRN

## 2016-08-02 NOTE — Patient Instructions (Addendum)
Vulvar Pain Vulvar pain is long-lasting (chronic) pain in the external female genitalia (vulva). This condition may also be called vulvodynia. The vulva includes tissues surrounding the vaginal opening and the urethra. Vulvar pain often has no known cause. There are two main types of vulvar pain:  Localized vulvar pain. This is when pain affects a specific area of the vulva.  Vulvar vestibulitis syndrome (VVS) is a type of localized vulvar pain in which pain is only felt around the opening (vestibule) of the vagina.  Generalized vulvar pain. This is when pain affects:  The entire vulva.  Multiple areas of the vulva.  One side of the vulva. What are the causes? The cause of vulvar pain is often not known, and many factors may contribute to it. It may be a type of nerve pain (neuropathic pain). What increases the risk? Women who have any of the following conditions may be more likely to develop vulvar pain:  Pelvic floor dysfunction.  A disorder that affects a protein (interleukin 1 beta receptor antagonist) involved in inflammation in the body.  An increased number of pain receptor nerves throughout the body.  Painful bladder.  Irritable bowel syndrome (IBS).  Fibromyalgia.  Restless sleep.  Depression.  Certain pain conditions. What are the signs or symptoms? The main symptom of this condition is pain that affects part of the vulva or the entire vulva. Pain may:  Feel like a burning, tearing, stinging, or sharp, knife-like sensation.  Be chronic and ongoing.  Occur off and on (intermittently).  Get worse when touching or putting pressure on the vulva, such as with tampon insertion, sexual intercourse, sitting for a long time, or wearing tight pants. Symptoms may also include:  Swelling or redness of the vulva, perineum, or inner thighs.  Psychological or emotional distress. How is this diagnosed? This condition may be diagnosed based on:  Your symptoms and your  medical history. Your health care provider may ask questions about what causes or worsens your pain.  A physical exam of your abdomen and pelvis. This may include a cotton swab test, in which your health care provider gently touches all areas of your vulva with a cotton swab to determine where you have pain.  Tests of your vaginal fluid. These tests may be done to check for infection. How is this treated? Treatment for this condition depends on the cause and severity of your pain. Treatment may include:  Caring for your skin.  Working with a health care provider who specializes in pain.  Physical therapy.  Counseling (psychotherapy). This may include:  Biofeedback. This is a type of therapy that helps you be more aware of your body's response to pain. It also helps you learn to deal with pain.  Alternative medical therapies to help relieve pain, such as:  Hypnotherapy. This means being put in a state of altered consciousness (hypnosis).  Acupuncture. This is the insertion of needles into certain places on the skin.  Surgery to remove affected parts of your vulva. Follow these instructions at home: Clothing   Wear cotton underwear.  Avoid wearing tight pants.  Wash clothing in non-scented, mild laundry detergent. Do not use fabric softeners or dryer sheets. Eating and drinking   Avoid caffeine.  Avoid foods that are high in sugar or highly processed. General instructions    Take over-the-counter and prescription medicines only as told by your health care provider.  If any soaps, lotions, creams, or ointments cause or worsen your pain, do not use  these products on the affected areas.  Do not use feminine wipes or douches.  Do not use scented body soaps, scented pads, or scented tampons.  Clean your vulvar skin with warm water only and pat the area dry.  Consider using a non-scented silicone-based or oil-based lubricant during sexual intercourse.  Keep all follow-up  visits as told by your health care provider. This is important. Contact a health care provider if:  Your pain gets worse or does not improve after starting treatment.  You develop vaginal discharge that smells bad. This information is not intended to replace advice given to you by your health care provider. Make sure you discuss any questions you have with your health care provider. Document Released: 07/19/2015 Document Revised: 09/02/2015 Document Reviewed: 03/04/2014 Elsevier Interactive Patient Education  2017 [employer] Atrophic Vaginitis Atrophic vaginitis is a condition in which the tissues that line the vagina become dry and thin. This condition is most common in women who have stopped having regular menstrual periods (menopause). This usually starts when a woman is 74-70 years old. Estrogen helps to keep the vagina moist. It stimulates the vagina to produce a clear fluid that lubricates the vagina for sexual intercourse. This fluid also protects the vagina from infection. Lack of estrogen can cause the lining of the vagina to get thinner and dryer. The vagina may also shrink in size. It may become less elastic. Atrophic vaginitis tends to get worse over time as a woman's estrogen level drops. What are the causes? This condition is caused by the normal drop in estrogen that happens around the time of menopause. What increases the risk? Certain conditions or situations may lower a woman's estrogen level, which increases her risk of atrophic vaginitis. These include:  Taking medicine that blocks estrogen.  Having ovaries removed surgically.  Being treated for cancer with X-ray treatment (radiation) or medicines (chemotherapy).  Exercising very hard and often.  Having an eating disorder (anorexia).  Giving birth or breastfeeding.  Being over the age of 59.  Smoking. What are the signs or symptoms? Symptoms of this condition include:  Pain, soreness, or bleeding during  sexual intercourse (dyspareunia).  Vaginal burning, irritation, or itching.  Pain or bleeding during a vaginal examination using a speculum (pelvic exam).  Loss of interest in sexual activity.  Having burning pain when passing urine.  Vaginal discharge that is brown or yellow. In some cases, there are no symptoms. How is this diagnosed? This condition is diagnosed with a medical history and physical exam. This will include a pelvic exam that checks whether the inside of your vagina appears pale, thin, or dry. Rarely, you may also have other tests, including:  A urine test.  A test that checks the acid balance in your vaginal fluid (acid balance test). How is this treated? Treatment for this condition may depend on the severity of your symptoms. Treatment may include:  Using an over-the-counter vaginal lubricant before you have sexual intercourse.  Using a long-acting vaginal moisturizer.  Using low-dose vaginal estrogen for moderate to severe symptoms that do not respond to other treatments. Options include creams, tablets, and inserts (vaginal rings). Before using vaginal estrogen, tell your health care provider if you have a history of:  Breast cancer.  Endometrial cancer.  Blood clots.  Taking medicines. You may be able to take a daily pill for dyspareunia. Discuss all of the risks of this medicine with your health care provider. It is usually not recommended for women who have  a family history or personal history of breast cancer. If your symptoms are very mild and you are not sexually active, you may not need treatment. Follow these instructions at home:  Take medicines only as directed by your health care provider. Do not use herbal or alternative medicines unless your health care provider says that you can.  Use over-the-counter creams, lubricants, or moisturizers for dryness only as directed by your health care provider.  If your atrophic vaginitis is caused by  menopause, discuss all of your menopausal symptoms and treatment options with your health care provider.  Do not douche.  Do not use products that can make your vagina dry. These include:  Scented feminine sprays.  Scented tampons.  Scented soaps.  If it hurts to have sex, talk with your sexual partner. Contact a health care provider if:  Your discharge looks different than normal.  Your vagina has an unusual smell.  You have new symptoms.  Your symptoms do not improve with treatment.  Your symptoms get worse. This information is not intended to replace advice given to you by your health care provider. Make sure you discuss any questions you have with your health care provider. Document Released: 08/11/2014 Document Revised: 09/02/2015 Document Reviewed: 03/18/2014 Elsevier Interactive Patient Education  2017 [place] Maintenance for Postmenopausal Women Menopause is a normal process in which your reproductive ability comes to an end. This process happens gradually over a span of months to years, usually between the ages of 41 and 3. Menopause is complete when you have missed 12 consecutive menstrual periods. It is important to talk with your health care provider about some of the most common conditions that affect postmenopausal women, such as heart disease, cancer, and bone loss (osteoporosis). Adopting a healthy lifestyle and getting preventive care can help to promote your health and wellness. Those actions can also lower your chances of developing some of these common conditions. What should I know about menopause? During menopause, you may experience a number of symptoms, such as:  Moderate-to-severe hot flashes.  Night sweats.  Decrease in sex drive.  Mood swings.  Headaches.  Tiredness.  Irritability.  Memory problems.  Insomnia. Choosing to treat or not to treat menopausal changes is an individual decision that you make with your health care  provider. What should I know about hormone replacement therapy and supplements? Hormone therapy products are effective for treating symptoms that are associated with menopause, such as hot flashes and night sweats. Hormone replacement carries certain risks, especially as you become older. If you are thinking about using estrogen or estrogen with progestin treatments, discuss the benefits and risks with your health care provider. What should I know about heart disease and stroke? Heart disease, heart attack, and stroke become more likely as you age. This may be due, in part, to the hormonal changes that your body experiences during menopause. These can affect how your body processes dietary fats, triglycerides, and cholesterol. Heart attack and stroke are both medical emergencies. There are many things that you can do to help prevent heart disease and stroke:  Have your blood pressure checked at least every 1-2 years. High blood pressure causes heart disease and increases the risk of stroke.  If you are 59-38 years old, ask your health care provider if you should take aspirin to prevent a heart attack or a stroke.  Do not use any tobacco products, including cigarettes, chewing tobacco, or electronic cigarettes. If you need help quitting, ask your  health care provider.  It is important to eat a healthy diet and maintain a healthy weight.  Be sure to include plenty of vegetables, fruits, low-fat dairy products, and lean protein.  Avoid eating foods that are high in solid fats, added sugars, or salt (sodium).  Get regular exercise. This is one of the most important things that you can do for your health.  Try to exercise for at least 150 minutes each week. The type of exercise that you do should increase your heart rate and make you sweat. This is known as moderate-intensity exercise.  Try to do strengthening exercises at least twice each week. Do these in addition to the moderate-intensity  exercise.  Know your numbers.Ask your health care provider to check your cholesterol and your blood glucose. Continue to have your blood tested as directed by your health care provider. What should I know about cancer screening? There are several types of cancer. Take the following steps to reduce your risk and to catch any cancer development as early as possible. Breast Cancer  Practice breast self-awareness.  This means understanding how your breasts normally appear and feel.  It also means doing regular breast self-exams. Let your health care provider know about any changes, no matter how small.  If you are 68 or older, have a clinician do a breast exam (clinical breast exam or CBE) every year. Depending on your age, family history, and medical history, it may be recommended that you also have a yearly breast X-ray (mammogram).  If you have a family history of breast cancer, talk with your health care provider about genetic screening.  If you are at high risk for breast cancer, talk with your health care provider about having an MRI and a mammogram every year.  Breast cancer (BRCA) gene test is recommended for women who have family members with BRCA-related cancers. Results of the assessment will determine the need for genetic counseling and BRCA1 and for BRCA2 testing. BRCA-related cancers include these types:  Breast. This occurs in males or females.  Ovarian.  Tubal. This may also be called fallopian tube cancer.  Cancer of the abdominal or pelvic lining (peritoneal cancer).  Prostate.  Pancreatic. Cervical, Uterine, and Ovarian Cancer  Your health care provider may recommend that you be screened regularly for cancer of the pelvic organs. These include your ovaries, uterus, and vagina. This screening involves a pelvic exam, which includes checking for microscopic changes to the surface of your cervix (Pap test).  For women ages 21-65, health care providers may recommend a  pelvic exam and a Pap test every three years. For women ages 67-65, they may recommend the Pap test and pelvic exam, combined with testing for human papilloma virus (HPV), every five years. Some types of HPV increase your risk of cervical cancer. Testing for HPV may also be done on women of any age who have unclear Pap test results.  Other health care providers may not recommend any screening for nonpregnant women who are considered low risk for pelvic cancer and have no symptoms. Ask your health care provider if a screening pelvic exam is right for you.  If you have had past treatment for cervical cancer or a condition that could lead to cancer, you need Pap tests and screening for cancer for at least 20 years after your treatment. If Pap tests have been discontinued for you, your risk factors (such as having a new sexual partner) need to be reassessed to determine if you should  start having screenings again. Some women have medical problems that increase the chance of getting cervical cancer. In these cases, your health care provider may recommend that you have screening and Pap tests more often.  If you have a family history of uterine cancer or ovarian cancer, talk with your health care provider about genetic screening.  If you have vaginal bleeding after reaching menopause, tell your health care provider.  There are currently no reliable tests available to screen for ovarian cancer. Lung Cancer  Lung cancer screening is recommended for adults 82-48 years old who are at high risk for lung cancer because of a history of smoking. A yearly low-dose CT scan of the lungs is recommended if you:  Currently smoke.  Have a history of at least 30 pack-years of smoking and you currently smoke or have quit within the past 15 years. A pack-year is smoking an average of one pack of cigarettes per day for one year. Yearly screening should:  Continue until it has been 15 years since you quit.  Stop if you  develop a health problem that would prevent you from having lung cancer treatment. Colorectal Cancer  This type of cancer can be detected and can often be prevented.  Routine colorectal cancer screening usually begins at age 16 and continues through age 26.  If you have risk factors for colon cancer, your health care provider may recommend that you be screened at an earlier age.  If you have a family history of colorectal cancer, talk with your health care provider about genetic screening.  Your health care provider may also recommend using home test kits to check for hidden blood in your stool.  A small camera at the end of a tube can be used to examine your colon directly (sigmoidoscopy or colonoscopy). This is done to check for the earliest forms of colorectal cancer.  Direct examination of the colon should be repeated every 5-10 years until age 48. However, if early forms of precancerous polyps or small growths are found or if you have a family history or genetic risk for colorectal cancer, you may need to be screened more often. Skin Cancer  Check your skin from head to toe regularly.  Monitor any moles. Be sure to tell your health care provider:  About any new moles or changes in moles, especially if there is a change in a mole's shape or color.  If you have a mole that is larger than the size of a pencil eraser.  If any of your family members has a history of skin cancer, especially at a young age, talk with your health care provider about genetic screening.  Always use sunscreen. Apply sunscreen liberally and repeatedly throughout the day.  Whenever you are outside, protect yourself by wearing long sleeves, pants, a wide-brimmed hat, and sunglasses. What should I know about osteoporosis? Osteoporosis is a condition in which bone destruction happens more quickly than new bone creation. After menopause, you may be at an increased risk for osteoporosis. To help prevent  osteoporosis or the bone fractures that can happen because of osteoporosis, the following is recommended:  If you are 38-35 years old, get at least 1,000 mg of calcium and at least 600 mg of vitamin D per day.  If you are older than age 25 but younger than age 67, get at least 1,200 mg of calcium and at least 600 mg of vitamin D per day.  If you are older than age 70, get  at least 1,200 mg of calcium and at least 800 mg of vitamin D per day. Smoking and excessive alcohol intake increase the risk of osteoporosis. Eat foods that are rich in calcium and vitamin D, and do weight-bearing exercises several times each week as directed by your health care provider. What should I know about how menopause affects my mental health? Depression may occur at any age, but it is more common as you become older. Common symptoms of depression include:  Low or sad mood.  Changes in sleep patterns.  Changes in appetite or eating patterns.  Feeling an overall lack of motivation or enjoyment of activities that you previously enjoyed.  Frequent crying spells. Talk with your health care provider if you think that you are experiencing depression. What should I know about immunizations? It is important that you get and maintain your immunizations. These include:  Tetanus, diphtheria, and pertussis (Tdap) booster vaccine.  Influenza every year before the flu season begins.  Pneumonia vaccine.  Shingles vaccine. Your health care provider may also recommend other immunizations. This information is not intended to replace advice given to you by your health care provider. Make sure you discuss any questions you have with your health care provider. Document Released: 05/19/2005 Document Revised: 10/15/2015 Document Reviewed: 12/29/2014 Elsevier Interactive Patient Education  2017 [place]. Menopause Menopause is the normal time of life when menstrual periods stop completely. Menopause is complete when you  have missed 12 consecutive menstrual periods. It usually occurs between the ages of 41 years and 82 years. Very rarely does a woman develop menopause before the age of 25 years. At menopause, your ovaries stop producing the female hormones estrogen and progesterone. This can cause undesirable symptoms and also affect your health. Sometimes the symptoms may occur 4-5 years before the menopause begins. There is no relationship between menopause and:  Oral contraceptives.  Number of children you had.  Race.  The age your menstrual periods started (menarche). Heavy smokers and very thin women may develop menopause earlier in life. What are the causes?  The ovaries stop producing the female hormones estrogen and progesterone. Other causes include:  Surgery to remove both ovaries.  The ovaries stop functioning for no known reason.  Tumors of the pituitary gland in the brain.  Medical disease that affects the ovaries and hormone production.  Radiation treatment to the abdomen or pelvis.  Chemotherapy that affects the ovaries. What are the signs or symptoms?  Hot flashes.  Night sweats.  Decrease in sex drive.  Vaginal dryness and thinning of the vagina causing painful intercourse.  Dryness of the skin and developing wrinkles.  Headaches.  Tiredness.  Irritability.  Memory problems.  Weight gain.  Bladder infections.  Hair growth of the face and chest.  Infertility. More serious symptoms include:  Loss of bone (osteoporosis) causing breaks (fractures).  Depression.  Hardening and narrowing of the arteries (atherosclerosis) causing heart attacks and strokes. How is this diagnosed?  When the menstrual periods have stopped for 12 straight months.  Physical exam.  Hormone studies of the blood. How is this treated? There are many treatment choices and nearly as many questions about them. The decisions to treat or not to treat menopausal changes is an individual  choice made with your health care provider. Your health care provider can discuss the treatments with you. Together, you can decide which treatment will work best for you. Your treatment choices may include:  Hormone therapy (estrogen and progesterone).  Non-hormonal medicines.  Treating the individual symptoms with medicine (for example antidepressants for depression).  Herbal medicines that may help specific symptoms.  Counseling by a psychiatrist or psychologist.  Group therapy.  Lifestyle changes including:  Eating healthy.  Regular exercise.  Limiting caffeine and alcohol.  Stress management and meditation.  No treatment. Follow these instructions at home:  Take the medicine your health care provider gives you as directed.  Get plenty of sleep and rest.  Exercise regularly.  Eat a diet that contains calcium (good for the bones) and soy products (acts like estrogen hormone).  Avoid alcoholic beverages.  Do not smoke.  If you have hot flashes, dress in layers.  Take supplements, calcium, and vitamin D to strengthen bones.  You can use over-the-counter lubricants or moisturizers for vaginal dryness.  Group therapy is sometimes very helpful.  Acupuncture may be helpful in some cases. Contact a health care provider if:  You are not sure you are in menopause.  You are having menopausal symptoms and need advice and treatment.  You are still having menstrual periods after age 50 years.  You have pain with intercourse.  Menopause is complete (no menstrual period for 12 months) and you develop vaginal bleeding.  You need a referral to a specialist (gynecologist, psychiatrist, or psychologist) for treatment. Get help right away if:  You have severe depression.  You have excessive vaginal bleeding.  You fell and think you have a broken bone.  You have pain when you urinate.  You develop leg or chest pain.  You have a fast pounding heart beat  (palpitations).  You have severe headaches.  You develop vision problems.  You feel a lump in your breast.  You have abdominal pain or severe indigestion. This information is not intended to replace advice given to you by your health care provider. Make sure you discuss any questions you have with your health care provider. Document Released: 06/17/2003 Document Revised: 09/02/2015 Document Reviewed: 10/24/2012 Elsevier Interactive Patient Education  2017 [employer].

## 2016-08-02 NOTE — Progress Notes (Signed)
Subjective:        ALANIE SYLER is a 46 y.o. female here for a routine exam.  Current complaints: Vaginal dryness and mild discomfort with intercourse.    Personal health questionnaire:  Is patient Ashkenazi Jewish, have a family history of breast and/or ovarian cancer: no Is there a family history of uterine cancer diagnosed at age < 80, gastrointestinal cancer, urinary tract cancer, family member who is a Field seismologist syndrome-associated carrier: no Is the patient overweight and hypertensive, family history of diabetes, personal history of gestational diabetes, preeclampsia or PCOS: no Is patient over 59, have PCOS,  family history of premature CHD under age 31, diabetes, smoke, have hypertension or peripheral artery disease:  no At any time, has a partner hit, kicked or otherwise hurt or frightened you?: no Over the past 2 weeks, have you felt down, depressed or hopeless?: no Over the past 2 weeks, have you felt little interest or pleasure in doing things?:no   Gynecologic History No LMP recorded. Patient is not currently having periods (Reason: Chemotherapy). Contraception: medical menopause from Chemo Rx for Breast CA Last Pap: 2017. Results were: normal Last mammogram: 2012. Results were: abnormal  Obstetric History OB History  No data available    Past Medical History:  Diagnosis Date  . Anxiety   . Asthma    as a child  . Breast cancer [facility]) Sept 2012   right breast, inner lower right  . Cervical dysplasia   . GERD (gastroesophageal reflux disease)    had during chemo  . Hx of radiation therapy 11/24/13- 01/08/14   reconstructed right breast/chest wall 4500 cGy 25 sessions, electron beam boost 1440 cGy 8 sessions  . Kidney stones   . Migraines   . Pneumonia   . PONV (postoperative nausea and vomiting)   . Rash    neck    Past Surgical History:  Procedure Laterality Date  . ADENOIDECTOMY    . BREAST LUMPECTOMY WITH NEEDLE LOCALIZATION Right 05/05/2013   Procedure: EXCISION RECURRENT CANCER RIGHT BREAST WITH NEEDLE LOCALOZATION;  Surgeon: Adin Hector, MD;  Location: [place];  Service: General;  Laterality: Right;  . BREAST RECONSTRUCTION  06/29/2011   Procedure: BREAST RECONSTRUCTION;  Surgeon: Theodoro Kos, DO;  Location: [place];  Service: Plastics;  Laterality: Bilateral;  Immediate Bilateral Breast Reconstruction with Bilateral Tissue Expanders and placement of  Alloderm   . DILATION AND CURETTAGE OF UTERUS    . fibroid tumor surgery    . lazy eye     corrective surgery  . MASTECTOMY W/ SENTINEL NODE BIOPSY  06/29/2011   Procedure: MASTECTOMY WITH SENTINEL LYMPH NODE BIOPSY;  Surgeon: Adin Hector, MD;  Location: [place];  Service: General;  Laterality: Right;  right skin spairing mstectomy and right sentinel lymph node biopsy  . PORT-A-CATH REMOVAL  03/12/2012   Procedure: MINOR REMOVAL PORT-A-CATH;  Surgeon: Adin Hector, MD;  Location: [place];  Service: General;  Laterality: N/A;  Upper left  . PORTACATH PLACEMENT Left 05/05/2013   Procedure: INSERTION PORT-A-CATH;  Surgeon: Adin Hector, MD;  Location: [place];  Service: General;  Laterality: Left;  . RHINOPLASTY    . uterine cervical treatments  2002   for precancerous lesions     Current Outpatient Prescriptions:  .  anastrozole (ARIMIDEX) 1 MG tablet, Take 1 tablet (1 mg total) by mouth daily., Disp: 90 tablet, Rfl: 4 .  aspirin-acetaminophen-caffeine (EXCEDRIN MIGRAINE) 174-081-44 MG per tablet, Take 2 tablets by  mouth every 6 (six) hours as needed for headache., Disp: , Rfl:  .  Biotin 5000 MCG CAPS, Take 5,000 mcg by mouth daily., Disp: , Rfl:  .  calcium citrate-vitamin D (CITRACAL+D) 315-200 MG-UNIT tablet, Take 1 tablet by mouth 2 (two) times daily., Disp: , Rfl:  .  cyclobenzaprine (FLEXERIL) 10 MG tablet, Take 1 tablet (10 mg total) by mouth every 8 (eight) hours as needed for muscle spasms., Disp: 30 tablet, Rfl: 2 .  ibuprofen (ADVIL,MOTRIN) 800  MG tablet, Take 1 tablet (800 mg total) by mouth every 8 (eight) hours as needed., Disp: 30 tablet, Rfl: 5 .  lidocaine-prilocaine (EMLA) cream, Apply 1 application topically 2 (two) times daily as needed., Disp: 30 g, Rfl: 2 .  LORazepam (ATIVAN) 0.5 MG tablet, Take 1 tablet (0.5 mg total) by mouth 2 (two) times daily as needed. for anxiety, Disp: 60 tablet, Rfl: 0 .  Prenatal Vit-Fe Fumarate-FA (PRENATAL VITAMIN PLUS LOW IRON) 27-1 MG TABS, Take 1 tablet by mouth daily., Disp: 90 tablet, Rfl: 3 .  traMADol (ULTRAM) 50 MG tablet, Take 1 tablet (50 mg total) by mouth every 6 (six) hours as needed. for pain, Disp: 60 tablet, Rfl: 0 Allergies  Allergen Reactions  . Tussionex Pennkinetic Er [Hydrocod Polst-Cpm Polst Er] Other (See Comments)    hallucinations  . Codeine Other (See Comments)    unkown  . Morphine Rash  . Morphine And Related Anxiety    anxiety  . Penicillin G Rash  . Penicillins Rash    Social History  Substance Use Topics  . Smoking status: Former Smoker    Quit date: 07/20/1996  . Smokeless tobacco: Never Used  . Alcohol use No    Family History  Problem Relation Age of Onset  . Adopted: Yes  . Heart attack Father   . Cancer Cousin 40    melanoma; mat first cousin, located on neck  . Cancer Maternal Aunt 60    stomach and ovarian as separate primaries  . Cancer Maternal Uncle 65    lung; smoker  . Cancer Maternal Grandmother 33    enodometrial  . Cancer Maternal Aunt 66    breast  . Cancer Maternal Uncle 63    prostate      Review of Systems  Constitutional: negative for fatigue and weight loss Respiratory: negative for cough and wheezing Cardiovascular: negative for chest pain, fatigue and palpitations Gastrointestinal: negative for abdominal pain and change in bowel habits Musculoskeletal:negative for myalgias Neurological: negative for gait problems and tremors Behavioral/Psych: negative for abusive relationship, depression Endocrine: negative for  temperature intolerance    Genitourinary:negative for abnormal menstrual periods, genital lesions, hot flashes, sexual problems and vaginal discharge Integument/breast: negative for breast lump, breast tenderness, nipple discharge and skin lesion(s)    Objective:       BP 101/65   Pulse 74   Wt 130 lb (59 kg)   BMI 23.03 kg/m  General:   alert  Skin:   no rash or abnormalities  Lungs:   clear to auscultation bilaterally  Heart:   regular rate and rhythm, S1, S2 normal, no murmur, click, rub or gallop  Breasts:   normal exam after bilateral mastectomies and reconstruction; without suspicious masses, skin or nipple changes or axillary nodes  Abdomen:  normal findings: no organomegaly, soft, non-tender and no hernia  Pelvis:  External genitalia: normal general appearance Urinary system: urethral meatus normal and bladder without fullness, nontender Vaginal: normal without tenderness, induration or masses Cervix:  normal appearance Adnexa: normal bimanual exam Uterus: anteverted and non-tender, normal size   Lab Review Urine pregnancy test Labs reviewed yes Radiologic studies reviewed yes  50% of 25 min visit spent on counseling and coordination of care.    Assessment:    Healthy female exam.    History of Breast CA.  S/P bilateral mastectomies / Chemo / Rads / Breast Reconstruction.  Doing well.  Mild Atrophic Vaginitis with mild dyspareunia   Plan:    Education reviewed: calcium supplements, depression evaluation, low fat, low cholesterol diet, self breast exams and weight bearing exercise. Contraception: post menopausal status. Follow up in: 1 year.   Meds ordered this encounter  Medications  . cyclobenzaprine (FLEXERIL) 10 MG tablet    Sig: Take 1 tablet (10 mg total) by mouth every 8 (eight) hours as needed for muscle spasms.    Dispense:  30 tablet    Refill:  2  . ibuprofen (ADVIL,MOTRIN) 800 MG tablet    Sig: Take 1 tablet (800 mg total) by mouth every 8 (eight)  hours as needed.    Dispense:  30 tablet    Refill:  5  . lidocaine-prilocaine (EMLA) cream    Sig: Apply 1 application topically 2 (two) times daily as needed.    Dispense:  30 g    Refill:  2  . Prenatal Vit-Fe Fumarate-FA (PRENATAL VITAMIN PLUS LOW IRON) 27-1 MG TABS    Sig: Take 1 tablet by mouth daily.    Dispense:  90 tablet    Refill:  3   Orders Placed This Encounter  Procedures  . Urine culture  . Comprehensive metabolic panel  . TSH  . CBC  . VITAMIN D 25 Hydroxy (Vit-D Deficiency, Fractures)  . POCT urine pregnancy     Patient ID: MIHIKA SURRETTE, female   DOB: [birth year], 46 y.o.   MRN: [id number]

## 2016-08-03 LAB — CERVICOVAGINAL ANCILLARY ONLY
Bacterial vaginitis: POSITIVE — AB
Candida vaginitis: NEGATIVE
Chlamydia: NEGATIVE
Neisseria Gonorrhea: NEGATIVE
Trichomonas: NEGATIVE

## 2016-08-03 LAB — COMPREHENSIVE METABOLIC PANEL
ALT: 18 IU/L (ref 0–32)
AST: 22 IU/L (ref 0–40)
Albumin/Globulin Ratio: 2.4 — ABNORMAL HIGH (ref 1.2–2.2)
Albumin: 4.5 g/dL (ref 3.5–5.5)
Alkaline Phosphatase: 89 IU/L (ref 39–117)
BUN/Creatinine Ratio: 10 (ref 9–23)
BUN: 8 mg/dL (ref 6–24)
Bilirubin Total: 0.5 mg/dL (ref 0.0–1.2)
CO2: 25 mmol/L (ref 18–29)
Calcium: 9.6 mg/dL (ref 8.7–10.2)
Chloride: 100 mmol/L (ref 96–106)
Creatinine, Ser: 0.84 mg/dL (ref 0.57–1.00)
GFR calc Af Amer: 96 mL/min/{1.73_m2} (ref >59)
GFR calc non Af Amer: 84 mL/min/{1.73_m2} (ref >59)
Globulin, Total: 1.9 g/dL (ref 1.5–4.5)
Glucose: 77 mg/dL (ref 65–99)
Potassium: 4.7 mmol/L (ref 3.5–5.2)
Sodium: 142 mmol/L (ref 134–144)
Total Protein: 6.4 g/dL (ref 6.0–8.5)

## 2016-08-03 LAB — CBC
Hematocrit: 38.3 % (ref 34.0–46.6)
Hemoglobin: 13.1 g/dL (ref 11.1–15.9)
MCH: 30.2 pg (ref 26.6–33.0)
MCHC: 34.2 g/dL (ref 31.5–35.7)
MCV: 88 fL (ref 79–97)
Platelets: 249 10*3/uL (ref 150–379)
RBC: 4.34 x10E6/uL (ref 3.77–5.28)
RDW: 13.5 % (ref 12.3–15.4)
WBC: 5.8 10*3/uL (ref 3.4–10.8)

## 2016-08-03 LAB — CYTOLOGY - PAP
Diagnosis: NEGATIVE
HPV: NOT DETECTED

## 2016-08-03 LAB — TSH: TSH: 1.36 u[IU]/mL (ref 0.450–4.500)

## 2016-08-03 LAB — VITAMIN D 25 HYDROXY (VIT D DEFICIENCY, FRACTURES): Vit D, 25-Hydroxy: 33.2 ng/mL (ref 30.0–100.0)

## 2016-08-04 ENCOUNTER — Other Ambulatory Visit: Payer: Self-pay | Admitting: Obstetrics

## 2016-08-04 ENCOUNTER — Encounter: Payer: Self-pay | Admitting: *Deleted

## 2016-08-04 DIAGNOSIS — N760 Acute vaginitis: Principal | ICD-10-CM

## 2016-08-04 DIAGNOSIS — B9689 Other specified bacterial agents as the cause of diseases classified elsewhere: Secondary | ICD-10-CM

## 2016-08-04 LAB — URINE CULTURE: Organism ID, Bacteria: NO GROWTH

## 2016-08-04 MED ORDER — METRONIDAZOLE 0.75 % VA GEL: 1.0000 | Freq: Two times a day (BID) | VAGINAL | 2 refills | Status: DC

## 2016-10-12 ENCOUNTER — Other Ambulatory Visit: Payer: Self-pay | Admitting: Oncology

## 2016-10-16 ENCOUNTER — Other Ambulatory Visit: Payer: Self-pay | Admitting: *Deleted

## 2016-12-01 ENCOUNTER — Other Ambulatory Visit: Payer: Self-pay | Admitting: *Deleted

## 2016-12-04 ENCOUNTER — Telehealth: Payer: Self-pay | Admitting: *Deleted

## 2016-12-04 ENCOUNTER — Other Ambulatory Visit: Payer: Self-pay | Admitting: *Deleted

## 2016-12-04 NOTE — Telephone Encounter (Signed)
This RN obtained authorization for MRI abd per peer to peer with RN case manager per [employer] speciality services.  Authorization # [id number] valid 12/01/2016 to 12/01/2017.  This note will be sent to [employer] for communication.

## 2016-12-06 ENCOUNTER — Other Ambulatory Visit: Payer: Self-pay | Admitting: Oncology

## 2016-12-06 DIAGNOSIS — Z803 Family history of malignant neoplasm of breast: Secondary | ICD-10-CM

## 2016-12-06 DIAGNOSIS — R160 Hepatomegaly, not elsewhere classified: Secondary | ICD-10-CM

## 2016-12-06 DIAGNOSIS — C50911 Malignant neoplasm of unspecified site of right female breast: Secondary | ICD-10-CM

## 2016-12-06 DIAGNOSIS — C50311 Malignant neoplasm of lower-inner quadrant of right female breast: Secondary | ICD-10-CM

## 2016-12-06 HISTORY — DX: Hepatomegaly, not elsewhere classified: R16.0

## 2016-12-13 ENCOUNTER — Other Ambulatory Visit (HOSPITAL_BASED_OUTPATIENT_CLINIC_OR_DEPARTMENT_OTHER): Payer: BLUE CROSS/BLUE SHIELD

## 2016-12-13 ENCOUNTER — Ambulatory Visit (HOSPITAL_COMMUNITY)
Admission: RE | Admit: 2016-12-13 | Discharge: 2016-12-13 | Disposition: A | Discharge status: Home / Self Care | Payer: BLUE CROSS/BLUE SHIELD | Source: Ambulatory Visit | Attending: Oncology | Admitting: Oncology

## 2016-12-13 DIAGNOSIS — C50911 Malignant neoplasm of unspecified site of right female breast: Secondary | ICD-10-CM

## 2016-12-13 DIAGNOSIS — C50311 Malignant neoplasm of lower-inner quadrant of right female breast: Secondary | ICD-10-CM

## 2016-12-13 DIAGNOSIS — Z170 Estrogen receptor positive status [ER+]: Secondary | ICD-10-CM | POA: Insufficient documentation

## 2016-12-13 LAB — CBC WITH DIFFERENTIAL/PLATELET
BASO%: 0.4 % (ref 0.0–2.0)
Basophils Absolute: 0 10*3/uL (ref 0.0–0.1)
EOS%: 1.7 % (ref 0.0–7.0)
Eosinophils Absolute: 0.1 10*3/uL (ref 0.0–0.5)
HCT: 40.7 % (ref 34.8–46.6)
HGB: 13.6 g/dL (ref 11.6–15.9)
LYMPH%: 24 % (ref 14.0–49.7)
MCH: 30.7 pg (ref 25.1–34.0)
MCHC: 33.4 g/dL (ref 31.5–36.0)
MCV: 91.8 fL (ref 79.5–101.0)
MONO#: 0.4 10*3/uL (ref 0.1–0.9)
MONO%: 5.3 % (ref 0.0–14.0)
NEUT#: 5 10*3/uL (ref 1.5–6.5)
NEUT%: 68.6 % (ref 38.4–76.8)
Platelets: 261 10*3/uL (ref 145–400)
RBC: 4.44 10*6/uL (ref 3.70–5.45)
RDW: 12.8 % (ref 11.2–14.5)
WBC: 7.2 10*3/uL (ref 3.9–10.3)
lymph#: 1.7 10*3/uL (ref 0.9–3.3)

## 2016-12-13 LAB — COMPREHENSIVE METABOLIC PANEL
ALT: 15 U/L (ref 0–55)
AST: 25 U/L (ref 5–34)
Albumin: 4.2 g/dL (ref 3.5–5.0)
Alkaline Phosphatase: 88 U/L (ref 40–150)
Anion Gap: 8 mEq/L (ref 3–11)
BUN: 18.4 mg/dL (ref 7.0–26.0)
CO2: 29 mEq/L (ref 22–29)
Calcium: 10.3 mg/dL (ref 8.4–10.4)
Chloride: 105 mEq/L (ref 98–109)
Creatinine: 1 mg/dL (ref 0.6–1.1)
EGFR: 69 mL/min/{1.73_m2} — ABNORMAL LOW (ref >90)
Glucose: 89 mg/dl (ref 70–140)
Potassium: 4.4 mEq/L (ref 3.5–5.1)
Sodium: 142 mEq/L (ref 136–145)
Total Bilirubin: 0.49 mg/dL (ref 0.20–1.20)
Total Protein: 7.3 g/dL (ref 6.4–8.3)

## 2016-12-13 MED ORDER — GADOBENATE DIMEGLUMINE 529 MG/ML IV SOLN
15.0000 mL | Freq: Once | INTRAVENOUS | Status: AC | PRN
  Administered 2016-12-13: 12 mL via INTRAVENOUS

## 2016-12-14 ENCOUNTER — Other Ambulatory Visit: Payer: Self-pay | Admitting: Oncology

## 2016-12-14 LAB — ESTRADIOL: Estradiol: <5 pg/mL

## 2016-12-14 LAB — FOLLICLE STIMULATING HORMONE: FSH: 108.7 m[IU]/mL

## 2016-12-15 ENCOUNTER — Other Ambulatory Visit: Payer: Self-pay | Admitting: Oncology

## 2016-12-15 NOTE — Telephone Encounter (Signed)
Dr Jana Hakim, This pt called 9/7 late afternoon for refill.  I did not feel comfortable refilling without your OK d/t script hasn't been refilled since 2016.  Please advise.

## 2016-12-18 ENCOUNTER — Other Ambulatory Visit: Payer: Self-pay

## 2016-12-18 ENCOUNTER — Ambulatory Visit (HOSPITAL_BASED_OUTPATIENT_CLINIC_OR_DEPARTMENT_OTHER): Payer: BLUE CROSS/BLUE SHIELD | Admitting: Medical

## 2016-12-18 ENCOUNTER — Telehealth: Payer: Self-pay

## 2016-12-18 VITALS — BP 100/70 | HR 73 | Temp 97.8°F | Resp 18 | Ht 63.0 in | Wt 126.5 lb

## 2016-12-18 DIAGNOSIS — M545 Low back pain, unspecified: Secondary | ICD-10-CM

## 2016-12-18 DIAGNOSIS — R5383 Other fatigue: Secondary | ICD-10-CM

## 2016-12-18 DIAGNOSIS — R110 Nausea: Secondary | ICD-10-CM

## 2016-12-18 DIAGNOSIS — B029 Zoster without complications: Secondary | ICD-10-CM | POA: Diagnosis not present

## 2016-12-18 DIAGNOSIS — G8929 Other chronic pain: Secondary | ICD-10-CM

## 2016-12-18 MED ORDER — TRAMADOL HCL 50 MG PO TABS: 50.0000 mg | ORAL_TABLET | Freq: Four times a day (QID) | ORAL | 0 refills | Status: DC | PRN

## 2016-12-18 MED ORDER — GABAPENTIN 300 MG PO CAPS: 300.0000 mg | ORAL_CAPSULE | Freq: Every day | ORAL | 0 refills | Status: DC

## 2016-12-18 MED ORDER — DOXYCYCLINE HYCLATE 100 MG PO TABS: 100.0000 mg | ORAL_TABLET | Freq: Two times a day (BID) | ORAL | 0 refills | Status: DC

## 2016-12-18 MED ORDER — VALACYCLOVIR HCL 1 G PO TABS: 1000.0000 mg | ORAL_TABLET | Freq: Three times a day (TID) | ORAL | 0 refills | Status: DC

## 2016-12-18 MED ORDER — PREDNISONE 20 MG PO TABS: ORAL_TABLET | ORAL | 0 refills | Status: DC

## 2016-12-18 NOTE — Telephone Encounter (Signed)
TCT patient and schedule appt in [facility] for this afternoon @ 2:30 pm to evaluate rash s/p CT scan.  This is the time that works out best for her.

## 2016-12-18 NOTE — Telephone Encounter (Signed)
Pt called stating that she has "hives" on her fingers, right side of face, neck, back, and mouth.  Pt tried oral benadryl and pepcid and topical hydrocortisone cream over the weekend with no improvement.  Per Dr Jana Hakim, pt should be seen in clinic.  Information given to [employer], PA .

## 2016-12-19 NOTE — Progress Notes (Signed)
Symptoms Management Clinic Progress Note   GOLDY CALANDRA [id number] Nov 23, 1970 46 y.o.  MEGHEN AKOPYAN is managed by Dr. Tressa Busman   Actively treated with chemotherapy: no  Current Therapy: The patient is currently taking anastrozole  Assessment/Plan:   Herpes zoster without complication - Plan: valACYclovir (VALTREX) 1000 MG tablet, predniSONE (DELTASONE) 20 MG tablet, gabapentin (NEURONTIN) 300 MG capsule  Chronic bilateral low back pain without sciatica - Plan: Ambulatory referral to Physical Therapy, traMADol (ULTRAM) 50 MG tablet, DISCONTINUED: traMADol (ULTRAM) 50 MG tablet   Herpes zoster: The patient was treated with Valtrex 1000 mg by mouth 3 times a day 7 days, prednisone taper 6 days, and gabapentin 300 mg by mouth daily at bedtime 21 days.   Chronic back pain: The patient was given a refill for tramadol 50 mg. Additionally a referral was placed for physical therapy.  Please see After Visit Summary for patient specific instructions.  Future Appointments Date Time Provider [place]  12/27/2016 2:00 PM Magrinat, Virgie Dad, MD [facility] None    Orders Placed This Encounter  Procedures  . Ambulatory referral to Physical Therapy       Subjective:   Patient ID:  KALEIGHA CHAMBERLIN is a 46 y.o. (DOB 10/18/1970) female.  Chief Complaint:  Chief Complaint  Patient presents with  . Rash    HPI SOLEI WUBBEN presents to the office today with a report of a diffuse rash initially over her face after having CT scans with contrast completed last week. She also reports having fatigue and nausea. She began Benadryl and Pepcid for 24 hours and then continued Benadryl and topical cortisone. She reports having a rash in 2016 after an MRI with contrast. Since around last Friday she has developed an erythematous and pruritic rash over her right lateral abdomen extending to her right superior buttock. The patient has a positive review of systems which  includes episodic chest tightness, sharp fleeting pain in her right neck which resolved several days ago and mild shortness of breath. She has had no difficulty with swallowing.  Rash:  KARIEL SKILLMAN presents with a rash.   Symptoms have been present for 3 days.   The rash is located on the face, flank and upper body.  Since then it has not spread. Parent has tried over the counter hydrocortisone cream and Benadryl and Pepcid for initial treatment showing rash improved.  Discomfort is mild, she describes this as an itching sensation over the diffuse rash on her right flank and right superior buttock.  Patient has not fever.  Recent illnesses: none.  Sick contacts: none known  Medications: I have reviewed the patient's current medications.  Allergies:  Allergies  Allergen Reactions  . Tussionex Pennkinetic Er [Hydrocod Polst-Cpm Polst Er] Other (See Comments)    hallucinations  . Codeine Other (See Comments)    unkown  . Morphine Rash  . Morphine And Related Anxiety    anxiety  . Penicillin G Rash  . Penicillins Rash    Past Medical History:  Diagnosis Date  . Anxiety   . Asthma    as a child  . Breast cancer [facility]) Sept 2012   right breast, inner lower right  . Cervical dysplasia   . GERD (gastroesophageal reflux disease)    had during chemo  . Hx of radiation therapy 11/24/13- 01/08/14   reconstructed right breast/chest wall 4500 cGy 25 sessions, electron beam boost 1440 cGy 8 sessions  . Kidney stones   .  Migraines   . Pneumonia   . PONV (postoperative nausea and vomiting)   . Rash    neck    Past Surgical History:  Procedure Laterality Date  . ADENOIDECTOMY    . BREAST LUMPECTOMY WITH NEEDLE LOCALIZATION Right 05/05/2013   Procedure: EXCISION RECURRENT CANCER RIGHT BREAST WITH NEEDLE LOCALOZATION;  Surgeon: Adin Hector, MD;  Location: [place];  Service: General;  Laterality: Right;  . BREAST RECONSTRUCTION  06/29/2011   Procedure: BREAST RECONSTRUCTION;   Surgeon: Theodoro Kos, DO;  Location: [place];  Service: Plastics;  Laterality: Bilateral;  Immediate Bilateral Breast Reconstruction with Bilateral Tissue Expanders and placement of  Alloderm   . DILATION AND CURETTAGE OF UTERUS    . fibroid tumor surgery    . lazy eye     corrective surgery  . MASTECTOMY W/ SENTINEL NODE BIOPSY  06/29/2011   Procedure: MASTECTOMY WITH SENTINEL LYMPH NODE BIOPSY;  Surgeon: Adin Hector, MD;  Location: [place];  Service: General;  Laterality: Right;  right skin spairing mstectomy and right sentinel lymph node biopsy  . PORT-A-CATH REMOVAL  03/12/2012   Procedure: MINOR REMOVAL PORT-A-CATH;  Surgeon: Adin Hector, MD;  Location: [place];  Service: General;  Laterality: N/A;  Upper left  . PORTACATH PLACEMENT Left 05/05/2013   Procedure: INSERTION PORT-A-CATH;  Surgeon: Adin Hector, MD;  Location: [place];  Service: General;  Laterality: Left;  . RHINOPLASTY    . uterine cervical treatments  2002   for precancerous lesions    @FAMHISTORY @  Social History   Social History  . Marital status: Married    Spouse name: N/A  . Number of children: N/A  . Years of education: N/A   Occupational History  . Not on file.   Social History Main Topics  . Smoking status: Former Smoker    Quit date: 07/20/1996  . Smokeless tobacco: Never Used  . Alcohol use No  . Drug use: No  . Sexual activity: Yes    Partners: Male   Other Topics Concern  . Not on file   Social History Narrative   She was adopted by her grandparents as a child.  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  She works in a Engineer, agricultural.    Past Medical History, Surgical history, Social history, and Family history were reviewed and updated as appropriate.   Please see review of systems for further details on the patient's review from today.   Review of Systems:  Review of Systems  Constitutional: Positive for fatigue. Negative for  activity change, appetite change, chills, diaphoresis and fever.  HENT: Negative for trouble swallowing.   Respiratory: Positive for chest tightness and shortness of breath.   Cardiovascular: Negative for chest pain.  Musculoskeletal: Positive for neck pain.  Skin: Positive for rash.    Objective:   Physical Exam:  BP 100/70 (BP Location: Left Arm, Patient Position: Sitting)   Pulse 73   Temp 97.8 F (36.6 C) (Oral)   Resp 18   Ht 5\' 3"  (1.6 m)   Wt 126 lb 8 oz (57.4 kg)   SpO2 100%   BMI 22.41 kg/m  ECOG: 0  Physical Exam  Constitutional: No distress.  HENT:  Head: Normocephalic and atraumatic.    Mouth/Throat: Oropharynx is clear and moist.  Neck: Normal range of motion. Neck supple.  Cardiovascular: Normal rate, regular rhythm and normal heart sounds.  Exam reveals no gallop and no  friction rub.   No murmur heard. Pulmonary/Chest: Effort normal and breath sounds normal. No respiratory distress. She has no wheezes. She has no rales.  Lymphadenopathy:    She has no cervical adenopathy.  Neurological: She is alert. Coordination normal.  Skin: Skin is warm and dry. Rash noted. She is not diaphoretic.     Psychiatric: She has a normal mood and affect. Her behavior is normal. Judgment and thought content normal.    Lab Review:     Component Value Date/Time   NA 142 12/13/2016 1426   K 4.4 12/13/2016 1426   CL 100 08/02/2016 1437   CL 104 09/23/2012 1415   CO2 29 12/13/2016 1426   GLUCOSE 89 12/13/2016 1426   GLUCOSE 96 09/23/2012 1415   BUN 18.4 12/13/2016 1426   CREATININE 1.0 12/13/2016 1426   CALCIUM 10.3 12/13/2016 1426   PROT 7.3 12/13/2016 1426   ALBUMIN 4.2 12/13/2016 1426   AST 25 12/13/2016 1426   ALT 15 12/13/2016 1426   ALKPHOS 88 12/13/2016 1426   BILITOT 0.49 12/13/2016 1426   GFRNONAA 84 08/02/2016 1437   GFRAA 96 08/02/2016 1437       Component Value Date/Time   WBC 7.2 12/13/2016 1425   WBC 6.0 01/08/2014 1459   RBC 4.44 12/13/2016  1425   RBC 4.40 01/08/2014 1459   HGB 13.6 12/13/2016 1425   HCT 40.7 12/13/2016 1425   PLT 261 12/13/2016 1425   PLT 249 08/02/2016 1437   MCV 91.8 12/13/2016 1425   MCH 30.7 12/13/2016 1425   MCH 30.0 01/08/2014 1459   MCHC 33.4 12/13/2016 1425   MCHC 34.1 01/08/2014 1459   RDW 12.8 12/13/2016 1425   LYMPHSABS 1.7 12/13/2016 1425   MONOABS 0.4 12/13/2016 1425   EOSABS 0.1 12/13/2016 1425   BASOSABS 0.0 12/13/2016 1425   -------------------------------  Imaging from last 24 hours (if applicable):  Radiology interpretation: Mr Liver W Wo Contrast  Result Date: 12/14/2016 CLINICAL DATA:  Recurrent right breast cancer. Follow-up liver lesion seen on prior CT. EXAM: MRI ABDOMEN WITHOUT AND WITH CONTRAST TECHNIQUE: Multiplanar multisequence MR imaging of the abdomen was performed both before and after the administration of intravenous contrast. CONTRAST:  66mL MULTIHANCE GADOBENATE DIMEGLUMINE 529 MG/ML IV SOLN COMPARISON:  12/29/2015 CT abdomen/pelvis.  04/05/2016 MRI abdomen. FINDINGS: Lower chest: No acute abnormality at the lung bases. Partially visualized intact appearing bilateral breast prostheses. Hepatobiliary: Normal liver size and configuration. No hepatic steatosis. There is a 0.8 cm focus of faint hyperenhancement in the posterior right liver dome (series 902/image 23), unchanged since 12/29/2015 CT study using similar measurement technique, which is occult on T1 and T2 imaging, and is favored represent a small venovenous shunt. Otherwise no liver lesions. Normal gallbladder with no cholelithiasis. No biliary ductal dilatation. Common bile duct diameter 3 mm. No choledocholithiasis. Pancreas: No pancreatic mass or duct dilation.  No pancreas divisum. Spleen: Normal size. No mass. Adrenals/Urinary Tract: Normal adrenals. No hydronephrosis. Subcentimeter simple renal cyst in the upper and interpolar right kidney. No suspicious renal masses. Stomach/Bowel: Grossly normal stomach.  Visualized small and large bowel is normal caliber, with no bowel wall thickening. Vascular/Lymphatic: Normal caliber abdominal aorta. Patent portal, splenic, hepatic and renal veins. No pathologically enlarged lymph nodes in the abdomen. Other: No abdominal ascites or focal fluid collection. Musculoskeletal: No aggressive appearing focal osseous lesions. Stable L4 vertebral hemangioma. IMPRESSION: 1. No findings suspicious for metastatic disease in the abdomen. 2. Subcentimeter focus of faint hyperenhancement in the posterior  right liver dome, stable since 12/29/2015 CT study, favored to represent a small benign venovenous shunt. 3. No acute abnormality. Electronically Signed   By: Ilona Sorrel M.D.   On: 12/14/2016 08:27        This patient was seen with Dr. Jana Hakim with my treatment plan reviewed with him. He expressed agreement with my medical management of this patient.   ADDENDUM: Izumi is having 2 problems. One of them is a general rash, which may be related to medication.More importantly she is having a focal area of zoster. It was difficult to pin this down but I do believe that this has been less than 3 days present. Accordingly we are proceeding with standard zoster treatment.  We also reviewed her scan results, which continue to be excellent.  At this point she feels comfortable and we have postponed her formal visit with meuntil December  I personally saw this patient and performed a substantive portion of this encounter with the listed APP documented above.   Chauncey Cruel, MD Medical Oncology and Hematology [facility] [address] [place], [place] [address] Tel. [phone]    Fax. [phone]

## 2016-12-20 ENCOUNTER — Telehealth: Payer: Self-pay | Admitting: Medical

## 2016-12-20 ENCOUNTER — Other Ambulatory Visit: Payer: BLUE CROSS/BLUE SHIELD

## 2016-12-20 NOTE — Telephone Encounter (Signed)
No 9/10 los.   

## 2016-12-21 ENCOUNTER — Telehealth: Payer: Self-pay | Admitting: Oncology

## 2016-12-21 ENCOUNTER — Telehealth: Payer: Self-pay

## 2016-12-21 NOTE — Telephone Encounter (Signed)
Left voicemail for patient regarding her appt in December. Sending her a confirmation letter as well.

## 2016-12-21 NOTE — Telephone Encounter (Signed)
Pt called concerning her dermatology visit today. Her dermatologist suggested for her to stop the valtrex and the gabapentin due to her feeling as her rash was not shingles and looked more like a contact dermatitis. Dermatologist instructed her to continue the prednisone and ordered her a cream. We are agreeable with these recommendations for treatment.  Cyndia Bent RN

## 2016-12-27 ENCOUNTER — Ambulatory Visit: Payer: BLUE CROSS/BLUE SHIELD | Admitting: Oncology

## 2017-01-17 ENCOUNTER — Other Ambulatory Visit: Payer: Self-pay | Admitting: Oncology

## 2017-01-22 ENCOUNTER — Other Ambulatory Visit: Payer: Self-pay | Admitting: Oncology

## 2017-01-22 DIAGNOSIS — C50911 Malignant neoplasm of unspecified site of right female breast: Secondary | ICD-10-CM

## 2017-01-22 DIAGNOSIS — Z803 Family history of malignant neoplasm of breast: Secondary | ICD-10-CM

## 2017-01-22 DIAGNOSIS — C50311 Malignant neoplasm of lower-inner quadrant of right female breast: Secondary | ICD-10-CM

## 2017-01-22 DIAGNOSIS — R160 Hepatomegaly, not elsewhere classified: Secondary | ICD-10-CM

## 2017-02-20 ENCOUNTER — Other Ambulatory Visit: Payer: Self-pay | Admitting: Oncology

## 2017-02-23 ENCOUNTER — Other Ambulatory Visit (HOSPITAL_BASED_OUTPATIENT_CLINIC_OR_DEPARTMENT_OTHER): Payer: BLUE CROSS/BLUE SHIELD

## 2017-02-23 ENCOUNTER — Other Ambulatory Visit: Payer: Self-pay

## 2017-02-23 DIAGNOSIS — C50311 Malignant neoplasm of lower-inner quadrant of right female breast: Secondary | ICD-10-CM

## 2017-02-23 DIAGNOSIS — C50911 Malignant neoplasm of unspecified site of right female breast: Secondary | ICD-10-CM

## 2017-02-23 LAB — CBC WITH DIFFERENTIAL/PLATELET
BASO%: 0.7 % (ref 0.0–2.0)
Basophils Absolute: 0 10*3/uL (ref 0.0–0.1)
EOS%: 1.6 % (ref 0.0–7.0)
Eosinophils Absolute: 0.1 10*3/uL (ref 0.0–0.5)
HCT: 40.9 % (ref 34.8–46.6)
HGB: 13.4 g/dL (ref 11.6–15.9)
LYMPH%: 26.3 % (ref 14.0–49.7)
MCH: 30.5 pg (ref 25.1–34.0)
MCHC: 32.9 g/dL (ref 31.5–36.0)
MCV: 92.8 fL (ref 79.5–101.0)
MONO#: 0.4 10*3/uL (ref 0.1–0.9)
MONO%: 7.7 % (ref 0.0–14.0)
NEUT#: 3.6 10*3/uL (ref 1.5–6.5)
NEUT%: 63.7 % (ref 38.4–76.8)
Platelets: 259 10*3/uL (ref 145–400)
RBC: 4.41 10*6/uL (ref 3.70–5.45)
RDW: 12.9 % (ref 11.2–14.5)
WBC: 5.6 10*3/uL (ref 3.9–10.3)
lymph#: 1.5 10*3/uL (ref 0.9–3.3)

## 2017-02-23 LAB — COMPREHENSIVE METABOLIC PANEL
ALT: 19 U/L (ref 0–55)
AST: 25 U/L (ref 5–34)
Albumin: 4.2 g/dL (ref 3.5–5.0)
Alkaline Phosphatase: 91 U/L (ref 40–150)
Anion Gap: 9 mEq/L (ref 3–11)
BUN: 11.2 mg/dL (ref 7.0–26.0)
CO2: 27 mEq/L (ref 22–29)
Calcium: 9.9 mg/dL (ref 8.4–10.4)
Chloride: 105 mEq/L (ref 98–109)
Creatinine: 0.9 mg/dL (ref 0.6–1.1)
EGFR: >60 mL/min/{1.73_m2} (ref >60)
Glucose: 81 mg/dl (ref 70–140)
Potassium: 4.2 mEq/L (ref 3.5–5.1)
Sodium: 142 mEq/L (ref 136–145)
Total Bilirubin: 0.49 mg/dL (ref 0.20–1.20)
Total Protein: 7.2 g/dL (ref 6.4–8.3)

## 2017-02-23 MED ORDER — LORAZEPAM 0.5 MG PO TABS: 0.5000 mg | ORAL_TABLET | Freq: Two times a day (BID) | ORAL | 0 refills | Status: DC | PRN

## 2017-02-24 LAB — ESTRADIOL: Estradiol: <5 pg/mL

## 2017-02-24 LAB — FOLLICLE STIMULATING HORMONE: FSH: 104.8 m[IU]/mL

## 2017-02-24 LAB — CANCER ANTIGEN 27.29: CA 27.29: 27.3 U/mL (ref 0.0–38.6)

## 2017-03-07 NOTE — Progress Notes (Signed)
ID: [name]   DOB: 05-26-1970  MR#: [id number]  CSN#:[id number]   PCP: Chauncey Cruel, MD GYN: Baltazar Najjar MD SU: Fanny Skates MD OTHER MD: Theodoro Kos, [name], [name]  CHIEF COMPLAINT:  Recurrent Breast Cancer, Right  CURRENT THERAPY: Observation   BREAST CANCER HISTORY:  From the original intake note:  "Lynda Wanninger (goes by "Roquel Burgin") is a [place] woman who, at the age of 46, was referred by Dr. Baltazar Najjar for treatment of right breast carcinoma.   1.  The patient palpated a mass in her right breast which she brought to Dr. Jacelyn Grip attention. A prior screening mammogram on 02/06/2010 had been unremarkable. The patient was then scheduled for bilateral diagnostic mammogram and right breast ultrasonography on 12/22/2010, the studies demonstrating an ill defined mass with heterogeneous calcifications in the inner lower right breast, measuring 1.7 cm. The breasts are heterogeneously dense, but there were no other mammographic abnormalities in either breast. Clinically, there was a palpable firm mass at the 5:00 position of the right breast, 5 cm from the nipple. There were no palpable abnormalities in the right axilla. Ultrasound confirmed a 1.7 cm irregular, hypoechoic mass in the same position. There were some lymph nodes noted in the right exam of, one measuring 1.6 cm with a minimally thickened cortex inferiorly.  An ultrasound-guided core biopsy on 12/23/2010 [id number]) showed an invasive ductal carcinoma, grade 2, ER positive at 88%, PR positive at 11 is %, HER-2/neu positive with a FISH ratio of 4.61, and an MIB-1 of 36%.  The patient was referred to Dr. Fanny Skates and bilateral breast MRIs were obtained 12/30/2010. This showed the mass in question in the right breast to measure 2.0 cm. In the right axilla there was a 1.8 cm lymph node with a thickened cortex. The left breast was unremarkable.  Her subsequent treatment is as detailed  below.  2.   Juanitta then had a restaging PET scan late December 2014 which was positive only for a small subcutaneous nodule in the right breast lower outer quadrant. There was no other finding of concern. Ultrasound of both breasts 04/08/2013 showed a nodule in the right breast at the 4:00 position measuring 0.7 cm. The right axilla was clear. There was an area at the 7:00 position of the left breast which was of concern by palpation but showed only normal tissue.   Biopsy of the right breast mass 04/09/2013 showed an invasive ductal carcinoma, grade 2, estrogen receptor 94% positive, progesterone receptor 11% positive, with an MIB-1 of 19% and HER-2 amplified with a signals ratio of 3.75 and the number per cell of 4.50. The patient discussed the situation with with her surgeon's and on 05/05/2013 proceeded to right lumpectomy, with the final pathology (S. is a [id number]) confirming an invasive ductal carcinoma, 0.7 cm, grade 2, within a millimeter of the posterior margin, which showed skeletal muscle. A port was placed at the time of that procedure."  Her subsequent treatment is as detailed below.  INTERVAL HISTORY:  Seraphina Mitchner returns today for follow-up of her estrogen receptor positive breast cancer.  I had understood that she was on anastrozole, but actually she stopped that medication on her own initiative several months ago and did not let us know about that.   Despite being off the anastrozole, she reports that she is having intense hot flashes.  She continues to have multiple aches and pains in many parts of her body.  She is having significant  problems sleeping, she has swellings in her left popliteal fossa and right forearm, she thinks she may have blood clots in her legs, she wonders if some of the problems with her neck is that she still has a wisdom teeth.  She feels there are some lymph nodes in the mid neck on both sides, she has pain in both breasts.  She wonders if her thyroid is  enlarged.  She is losing her hair, she has fairly reliable back pain occurring most evenings, for which she takes Ultram and this allows her to finish up the day.  She has frequent headaches.  She feels weird a lot of the time.  She is getting acne again.  Since her last visit, she has completed a MRI of the Abdomen/ Liver on 12/13/2016 showing : No findings suspicious for metastatic disease in the abdomen. Subcentimeter focus of faint hyperenhancement in the posterior right liver dome, stable since 12/29/2015 CT study, favored to represent a small benign venovenous shunt. No acute abnormality.   REVIEW OF SYSTEMS: Amabel reports that she has not been well lately. She reports having some mild pain in the right breast and under the right arm. She notes that the left side of her neck seems to be more prominent. She reports pain in certain parts of her legs in the front of her shin, under the knee, in the left mid thigh, and in the left calf. She reports that she is still taking hydrocodone for skin itchiness and rash. She reports that she would like a Rx for doccicycline. She reports that she is losing her hair while combing and washing. She was taking tramadol twice daily for back pain occuring mostly in the afternoon or evening. She notices that if she is standing for long periods or lifting things, her back starts hurting severely. She has not been to physical therapy for the lower back pain and degenerative disk disease.  She reports that even with her current dosage, she takes excedrin additionally because of severe hadaches. She says she tries to read 4-6 chapters of her bible every day, and reports that she has to take a nap afterwards, which is unusual for her to feel so fatigued.  She reports that she has no motivation and has been feeling some sadness lately. She notes tightness in her throat while she cries. She also reports a stinging pain in her right jaw underneath her ear. She denies unusual  headaches, visual changes, nausea, vomiting, or dizziness. There has been no unusual cough, phlegm production, or pleurisy. This been no change in bowel or bladder habits. She denies unexplained fatigue or unexplained weight loss, bleeding, rash, or fever. A detailed review of systems was otherwise stable.    PAST MEDICAL HISTORY: Past Medical History:  Diagnosis Date  . Anxiety   . Asthma    as a child  . Breast cancer [facility]) Sept 2012   right breast, inner lower right  . Cervical dysplasia   . GERD (gastroesophageal reflux disease)    had during chemo  . Hx of radiation therapy 11/24/13- 01/08/14   reconstructed right breast/chest wall 4500 cGy 25 sessions, electron beam boost 1440 cGy 8 sessions  . Kidney stones   . Migraines   . Pneumonia   . PONV (postoperative nausea and vomiting)   . Rash    neck  1. History of prior adenoidectomy. 2. History of rhinoplasty.   3. History of multiple uterine cervical treatments for precancerous lesions, none for the  past 10 years or so. 4. History of fibroid tumor surgery. 5. History of D and C. 6. History of dysplastic nevi removed by Dr. Ronnald Ramp. 7. History of right "lazy eye" corrective surgery. 8. History of approximately 10-pack-years of tobacco abuse, resolved.  9. History of possible migraines (morning headaches). History of asthma as a child.  PAST SURGICAL HISTORY: Past Surgical History:  Procedure Laterality Date  . ADENOIDECTOMY    . BREAST LUMPECTOMY WITH NEEDLE LOCALIZATION Right 05/05/2013   Procedure: EXCISION RECURRENT CANCER RIGHT BREAST WITH NEEDLE LOCALOZATION;  Surgeon: Adin Hector, MD;  Location: [place];  Service: General;  Laterality: Right;  . BREAST RECONSTRUCTION  06/29/2011   Procedure: BREAST RECONSTRUCTION;  Surgeon: Theodoro Kos, DO;  Location: [place];  Service: Plastics;  Laterality: Bilateral;  Immediate Bilateral Breast Reconstruction with Bilateral Tissue Expanders and placement of  Alloderm   . DILATION  AND CURETTAGE OF UTERUS    . fibroid tumor surgery    . lazy eye     corrective surgery  . MASTECTOMY W/ SENTINEL NODE BIOPSY  06/29/2011   Procedure: MASTECTOMY WITH SENTINEL LYMPH NODE BIOPSY;  Surgeon: Adin Hector, MD;  Location: [place];  Service: General;  Laterality: Right;  right skin spairing mstectomy and right sentinel lymph node biopsy  . PORT-A-CATH REMOVAL  03/12/2012   Procedure: MINOR REMOVAL PORT-A-CATH;  Surgeon: Adin Hector, MD;  Location: [place];  Service: General;  Laterality: N/A;  Upper left  . PORTACATH PLACEMENT Left 05/05/2013   Procedure: INSERTION PORT-A-CATH;  Surgeon: Adin Hector, MD;  Location: [place];  Service: General;  Laterality: Left;  . RHINOPLASTY    . uterine cervical treatments  2002   for precancerous lesions    FAMILY HISTORY Family History  Adopted: Yes  Problem Relation Age of Onset  . Heart attack Father   . Cancer Cousin 40       melanoma; mat first cousin, located on neck  . Cancer Maternal Aunt 60       stomach and ovarian as separate primaries  . Cancer Maternal Uncle 65       lung; smoker  . Cancer Maternal Grandmother 50       enodometrial  . Cancer Maternal Aunt 55       breast  . Cancer Maternal Uncle 63       prostate  :  The patient's biological father died at the age of 7 from myocardial infarction.  The patient's biological mother is alive at age 63.  The patient was adopted by her biological mother's parents.  She has 3 half-brothers.  No sisters.  One cousin on her mother's side died from melanoma at the age of 69.  There is other cancer history in the adopted side but these are not even half-brothers or sisters, they are children of her adopted parents who are really her grandparents so these would be uncles and aunts (1 lymph node cancer, 1 prostate cancer, 1 breast cancer at the age of 58).  GYNECOLOGIC HISTORY:   (Updated 05/22/2013) She had menarche age 38 or 65.  She is GX, P1, first  pregnancy to term at age 44. She stopped having periods approximately March 2013.  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  Her husband, Mary-Ann Pennella owns a family business called [employer].  They have a son, Kellie Simmering, age 87. Romilda Joy also has a daughter, Nessie Nong, who lives in [place] and had twins in 2016.  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  The patient is not a church attender.    ADVANCED DIRECTIVES: Not in place  HEALTH MAINTENANCE:  (Updated 06/12/2013) Social History   Tobacco Use  . Smoking status: Former Smoker    Last attempt to quit: 07/20/1996    Years since quitting: 20.6  . Smokeless tobacco: Never Used  Substance Use Topics  . Alcohol use: No    Alcohol/week: 0.0 oz  . Drug use: No     Colonoscopy:  Never  PAP: Oct 2014/[facility]  Bone density: Oct 2014, mild osteopenia with a T score of -1.1  Lipid panel: Not on file  Allergies  Allergen Reactions  . Tussionex Pennkinetic Er [Hydrocod Polst-Cpm Polst Er] Other (See Comments)    hallucinations  . Codeine Other (See Comments)    unkown  . Contrast Media [Iodinated Diagnostic Agents] Rash  . Morphine Rash  . Morphine And Related Anxiety    anxiety  . Penicillin G Rash  . Penicillins Rash    Current Outpatient Medications  Medication Sig Dispense Refill  . aspirin-acetaminophen-caffeine (EXCEDRIN MIGRAINE) 250-250-65 MG per tablet Take 2 tablets by mouth every 6 (six) hours as needed for headache.    . Biotin 5000 MCG CAPS Take 5,000 mcg by mouth daily.    . calcium citrate-vitamin D (CITRACAL+D) 315-200 MG-UNIT tablet Take 1 tablet by mouth 2 (two) times daily.    [name] doxycycline (VIBRA-TABS) 100 MG tablet Take 1 tablet (100 mg total) by mouth 2 (two) times daily. 30 tablet 0   . doxycycline (VIBRA-TABS) 100 MG tablet Take 1 tablet (100 mg total) by mouth daily. 90 tablet 0  . escitalopram (LEXAPRO) 20 MG tablet Take 1 tablet (20 mg total) by mouth daily. 90 tablet 4  . hydrocortisone 1 % ointment Apply 1 application topically 2 (two) times daily. 30 g 0  . LORazepam (ATIVAN) 0.5 MG tablet Take 1 tablet (0.5 mg total) 2 (two) times daily as needed by mouth. for anxiety 60 tablet 0  . Prenatal Vit-Fe Fumarate-FA (PRENATAL VITAMIN PLUS LOW IRON) 27-1 MG TABS Take 1 tablet by mouth daily. (Patient not taking: Reported on 12/18/2016) 90 tablet 3  . traMADol (ULTRAM) 50 MG tablet Take 1 tablet (50 mg total) by mouth every 6 (six) hours as needed. for pain 90 tablet 0  . valACYclovir (VALTREX) 1000 MG tablet Take 1 tablet (1,000 mg total) by mouth 3 (three) times daily. 21 tablet 0   No current facility-administered medications for this visit.     OBJECTIVE: Middle-aged white woman who appears stated age 46 Vitals:   03/12/17 1445  BP: 90/71  Pulse: 73  Resp: 18  Temp: 98.1 F (36.7 C)  SpO2: 100%     Body mass index is 22.34 kg/m.    ECOG FS: 1 Filed Weights   03/12/17 1445  Weight: 126 lb 1.6 oz (57.2 kg)   Sclerae unicteric, EOMs intact Oropharynx clear and moist No cervical or supraclavicular adenopathy Lungs no rales or rhonchi Heart regular rate and rhythm Abd soft, nontender, positive bowel sounds MSK no focal spinal tenderness, no upper extremity lymphedema Neuro: nonfocal, well oriented, tearful and anxious affect Breasts: Bilateral mastectomies with bilateral implant reconstruction.  There is no evidence of local recurrence.  Both axillae  are benign.  LAB RESULTS: Lab Results  Component Value Date   WBC 5.6 02/23/2017   NEUTROABS 3.6 02/23/2017   HGB 13.4 02/23/2017   HCT 40.9 02/23/2017   MCV 92.8 02/23/2017   PLT 259 02/23/2017      Chemistry      Component Value Date/Time   NA 142 02/23/2017 1356   K 4.2 02/23/2017 1356   CL 100  08/02/2016 1437   CL 104 09/23/2012 1415   CO2 27 02/23/2017 1356   BUN 11.2 02/23/2017 1356   CREATININE 0.9 02/23/2017 1356      Component Value Date/Time   CALCIUM 9.9 02/23/2017 1356   ALKPHOS 91 02/23/2017 1356   AST 25 02/23/2017 1356   ALT 19 02/23/2017 1356   BILITOT 0.49 02/23/2017 1356      STUDIES: Since her last visit, she has completed a MRI of the Abdomen/ Liver on 12/13/2016 showing : No findings suspicious for metastatic disease in the abdomen. Subcentimeter focus of faint hyperenhancement in the posterior right liver dome, stable since 12/29/2015 CT study, favored to represent a small benign venovenous shunt. No acute abnormality.  ASSESSMENT: 46 y.o.  BRCA 1-2 negative [name], [place] woman,   (1) status post right breast lower inner quadrant biopsy in September 2012 for a clinically 2.0 cm invasive ductal carcinoma, with some enlarged Right axillary lymph nodes, the largest one of which was negative by biopsy. The tumor was grade 2, estrogen receptor positive at 88%, progesterone receptor positive at 11%, HER-2/neu positive by CISH with a ratio of 4.61, and a proliferation marker of 36%.   (2) treated in the neoadjuvant setting with Q 3 week docetaxel/ carboplatin/ trastuzumab x6, completed 05/18/2011.  Continued on trastuzumab every 3 weeks to complete one year, last dose in November 2013.  (3) s/p bilateral mastectomies with sentinel node biopsy 06/29/2011 for a residual right-sided ypT1c ypN0 invasive ductal carcinoma, grade 2, with immediate expander placement  (4) on tamoxifen as of May 2013, discontinued in early 2015 due to breast cancer recurrence   RECURRENT BREAST CANCER January 2015 (5) status post right lumpectomy with right-sided sentinel lymph node sampling 05/05/2013 for a pT1b cN0, stage IA invasive ductal carcinoma, estrogen receptor 94% positive, progesterone receptor 11% positive, with an MIB-1 of 19% and HER-2 amplification  (6)  Received TDM-1  every 3 weeks x 8 doses completed 10/09/2013; echo 08/25/2013 showed a well preserved ejection fraction  (7) adjuvant radiation therapy completed 01/08/2014  (8) started anastrozole 04/10/2014   (a) [place] and estradiol in menopausal range 02/04/2014, to be followed every 3 months  (b) bone density 01/15/2013 showed a T score of -1.1  (c) switched to letrozole 07/07/2014, discontinued June 2016 because of arthralgias/myalgias  (d) started exemestane September 2016, stopped March 2017 because of cost issues  (e) bone density 06/17/2015 showed a T score of -1.5   (f) anastrozole resumed April 2017, discontinued by the patient sometime around April 2018  (9) genetic testing (23 genes associated with hereditary ovarian cancer through [facility], April 2015) 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  There were no deleterious mutations noted   PLAN:  I spent a little over 40 minutes today with the Rena going over her multiple problems.  She brought a long list of these and they require discussion in detail.  Overall however I do not see evidence of disease recurrence.  She has multiple issues which are likely unrelated and each minor in  itself, but which really get in her way when taken altogether and in addition they are magnified by depression.  I think she would benefit from letrozole.  She did take this in the past and it was helpful.  I have gone ahead and placed a new prescription for her.  I think she has a lipoma in the right forearm.  I do not think she has a Baker's cyst in her left popliteal fossa.  I do not palpate any adenopathy in her neck other than the usual submental glands.  I think what she is feeling in her neck is part of her tracheal mechanism.  I am sorry she has gone off the anastrozole and especially that she did not share that information with [place] until now.  I suggested since she does not feel any better off the medication she should  consider going back on it but she is adamant that she does not want any "more pills".  Accordingly we will continue with observation alone.    Although I do believe that she is not experiencing recurrent breast cancer it is hard to be sure and I think it will be a good idea to obtain a CT of the neck and chest as well as plain films of her lumbar spine.  I am referring her to physical therapy hoping that this will help.  [facility]  I refilled her doxycycline for her acne and wrote her for 1% hydrocortisone cream for other areas of the skin that are bothering her.  Expecting good news from all this, I scheduled her to see me again in March, with lab work before that visit.  She knows to call for any other issues that may develop before then.  Adrick Kestler, Virgie Dad, MD  03/12/17 3:44 PM Medical Oncology and Hematology [facility] [address] [place], [place] [address] Tel. [phone]    Fax. [phone]  This document serves as a record of services personally performed by Lurline Del, MD. It was created on his behalf by Sheron Nightingale, a trained medical scribe. The creation of this record is based on the scribe's personal observations and the provider's statements to them.   I have reviewed the above documentation for accuracy and completeness, and I agree with the above.

## 2017-03-12 ENCOUNTER — Ambulatory Visit (HOSPITAL_BASED_OUTPATIENT_CLINIC_OR_DEPARTMENT_OTHER): Payer: BLUE CROSS/BLUE SHIELD | Admitting: Oncology

## 2017-03-12 ENCOUNTER — Other Ambulatory Visit: Payer: BLUE CROSS/BLUE SHIELD

## 2017-03-12 ENCOUNTER — Telehealth: Payer: Self-pay | Admitting: Oncology

## 2017-03-12 VITALS — BP 90/71 | HR 73 | Temp 98.1°F | Resp 18 | Ht 63.0 in | Wt 126.1 lb

## 2017-03-12 DIAGNOSIS — M7989 Other specified soft tissue disorders: Secondary | ICD-10-CM | POA: Diagnosis not present

## 2017-03-12 DIAGNOSIS — N951 Menopausal and female climacteric states: Secondary | ICD-10-CM | POA: Diagnosis not present

## 2017-03-12 DIAGNOSIS — F329 Major depressive disorder, single episode, unspecified: Secondary | ICD-10-CM | POA: Insufficient documentation

## 2017-03-12 DIAGNOSIS — C50311 Malignant neoplasm of lower-inner quadrant of right female breast: Secondary | ICD-10-CM | POA: Diagnosis not present

## 2017-03-12 DIAGNOSIS — C50411 Malignant neoplasm of upper-outer quadrant of right female breast: Secondary | ICD-10-CM

## 2017-03-12 DIAGNOSIS — M549 Dorsalgia, unspecified: Secondary | ICD-10-CM

## 2017-03-12 DIAGNOSIS — R51 Headache: Secondary | ICD-10-CM

## 2017-03-12 DIAGNOSIS — R21 Rash and other nonspecific skin eruption: Secondary | ICD-10-CM | POA: Diagnosis not present

## 2017-03-12 DIAGNOSIS — F332 Major depressive disorder, recurrent severe without psychotic features: Secondary | ICD-10-CM

## 2017-03-12 DIAGNOSIS — Z170 Estrogen receptor positive status [ER+]: Secondary | ICD-10-CM

## 2017-03-12 DIAGNOSIS — M79605 Pain in left leg: Secondary | ICD-10-CM

## 2017-03-12 DIAGNOSIS — C50911 Malignant neoplasm of unspecified site of right female breast: Secondary | ICD-10-CM

## 2017-03-12 HISTORY — DX: Major depressive disorder, single episode, unspecified: F32.9

## 2017-03-12 MED ORDER — HYDROCORTISONE 1 % EX OINT: 1.0000 "application " | TOPICAL_OINTMENT | Freq: Two times a day (BID) | CUTANEOUS | 0 refills | Status: DC

## 2017-03-12 MED ORDER — DOXYCYCLINE HYCLATE 100 MG PO TABS: 100.0000 mg | ORAL_TABLET | Freq: Every day | ORAL | 0 refills | Status: DC

## 2017-03-12 MED ORDER — ESCITALOPRAM OXALATE 20 MG PO TABS: 20.0000 mg | ORAL_TABLET | Freq: Every day | ORAL | 4 refills | Status: DC

## 2017-03-12 NOTE — Telephone Encounter (Signed)
Gave patient avs and calendar with appts per 12/3 los.

## 2017-03-21 ENCOUNTER — Other Ambulatory Visit: Payer: Self-pay | Admitting: *Deleted

## 2017-03-22 ENCOUNTER — Telehealth: Payer: Self-pay

## 2017-03-22 ENCOUNTER — Other Ambulatory Visit: Payer: Self-pay

## 2017-03-22 NOTE — Telephone Encounter (Signed)
PA request for CT scans sent inbasket to [facility].  CS to call pt and schedule appt for scans

## 2017-03-29 ENCOUNTER — Ambulatory Visit (HOSPITAL_COMMUNITY)
Admission: RE | Admit: 2017-03-29 | Discharge: 2017-03-29 | Disposition: A | Discharge status: Home / Self Care | Payer: BLUE CROSS/BLUE SHIELD | Source: Ambulatory Visit | Attending: Oncology | Admitting: Oncology

## 2017-03-29 ENCOUNTER — Other Ambulatory Visit: Payer: Self-pay | Admitting: Oncology

## 2017-03-29 DIAGNOSIS — Z170 Estrogen receptor positive status [ER+]: Secondary | ICD-10-CM | POA: Diagnosis not present

## 2017-03-29 DIAGNOSIS — C50311 Malignant neoplasm of lower-inner quadrant of right female breast: Secondary | ICD-10-CM

## 2017-03-29 DIAGNOSIS — C50911 Malignant neoplasm of unspecified site of right female breast: Secondary | ICD-10-CM | POA: Insufficient documentation

## 2017-03-29 MED ORDER — IOPAMIDOL (ISOVUE-300) INJECTION 61%
INTRAVENOUS | Status: AC
  Administered 2017-03-29: 75 mL
  Filled 2017-03-29: qty 75

## 2017-03-30 ENCOUNTER — Other Ambulatory Visit: Payer: Self-pay | Admitting: Oncology

## 2017-06-06 ENCOUNTER — Other Ambulatory Visit: Payer: Self-pay

## 2017-06-06 ENCOUNTER — Inpatient Hospital Stay: Payer: BLUE CROSS/BLUE SHIELD | Attending: Oncology

## 2017-06-06 DIAGNOSIS — Z170 Estrogen receptor positive status [ER+]: Secondary | ICD-10-CM | POA: Diagnosis not present

## 2017-06-06 DIAGNOSIS — C50311 Malignant neoplasm of lower-inner quadrant of right female breast: Secondary | ICD-10-CM | POA: Diagnosis present

## 2017-06-06 DIAGNOSIS — C50911 Malignant neoplasm of unspecified site of right female breast: Secondary | ICD-10-CM

## 2017-06-06 LAB — CBC WITH DIFFERENTIAL/PLATELET
Basophils Absolute: 0 10*3/uL (ref 0.0–0.1)
Basophils Relative: 0 %
Eosinophils Absolute: 0.1 10*3/uL (ref 0.0–0.5)
Eosinophils Relative: 2 %
HCT: 39.6 % (ref 34.8–46.6)
Hemoglobin: 13.1 g/dL (ref 11.6–15.9)
Lymphocytes Relative: 29 %
Lymphs Abs: 1.5 10*3/uL (ref 0.9–3.3)
MCH: 30.4 pg (ref 25.1–34.0)
MCHC: 33.1 g/dL (ref 31.5–36.0)
MCV: 91.8 fL (ref 79.5–101.0)
Monocytes Absolute: 0.3 10*3/uL (ref 0.1–0.9)
Monocytes Relative: 6 %
Neutro Abs: 3.3 10*3/uL (ref 1.5–6.5)
Neutrophils Relative %: 63 %
Platelets: 250 10*3/uL (ref 145–400)
RBC: 4.31 MIL/uL (ref 3.70–5.45)
RDW: 12.4 % (ref 11.2–14.5)
WBC: 5.3 10*3/uL (ref 3.9–10.3)

## 2017-06-06 LAB — COMPREHENSIVE METABOLIC PANEL
ALT: 19 U/L (ref 0–55)
AST: 22 U/L (ref 5–34)
Albumin: 3.9 g/dL (ref 3.5–5.0)
Alkaline Phosphatase: 96 U/L (ref 40–150)
Anion gap: 10 (ref 3–11)
BUN: 9 mg/dL (ref 7–26)
CO2: 28 mmol/L (ref 22–29)
Calcium: 9.9 mg/dL (ref 8.4–10.4)
Chloride: 105 mmol/L (ref 98–109)
Creatinine, Ser: 0.82 mg/dL (ref 0.60–1.10)
GFR calc Af Amer: >60 mL/min (ref >60)
GFR calc non Af Amer: >60 mL/min (ref >60)
Glucose, Bld: 53 mg/dL — ABNORMAL LOW (ref 70–140)
Potassium: 3.9 mmol/L (ref 3.5–5.1)
Sodium: 143 mmol/L (ref 136–145)
Total Bilirubin: 0.5 mg/dL (ref 0.2–1.2)
Total Protein: 6.5 g/dL (ref 6.4–8.3)

## 2017-06-06 LAB — TSH: TSH: 1.129 u[IU]/mL (ref 0.308–3.960)

## 2017-06-07 ENCOUNTER — Encounter: Payer: Self-pay | Admitting: Oncology

## 2017-06-07 LAB — ESTRADIOL: Estradiol: 13.8 pg/mL

## 2017-06-07 LAB — FOLLICLE STIMULATING HORMONE: FSH: 98.3 m[IU]/mL

## 2017-06-13 ENCOUNTER — Telehealth: Payer: Self-pay | Admitting: Oncology

## 2017-06-13 ENCOUNTER — Inpatient Hospital Stay: Payer: BLUE CROSS/BLUE SHIELD | Attending: Oncology | Admitting: Oncology

## 2017-06-13 VITALS — BP 107/70 | HR 68 | Temp 98.3°F | Resp 18 | Ht 63.0 in

## 2017-06-13 DIAGNOSIS — Z9221 Personal history of antineoplastic chemotherapy: Secondary | ICD-10-CM

## 2017-06-13 DIAGNOSIS — Z170 Estrogen receptor positive status [ER+]: Secondary | ICD-10-CM

## 2017-06-13 DIAGNOSIS — Z9013 Acquired absence of bilateral breasts and nipples: Secondary | ICD-10-CM

## 2017-06-13 DIAGNOSIS — C50911 Malignant neoplasm of unspecified site of right female breast: Secondary | ICD-10-CM

## 2017-06-13 DIAGNOSIS — D696 Thrombocytopenia, unspecified: Secondary | ICD-10-CM

## 2017-06-13 DIAGNOSIS — F332 Major depressive disorder, recurrent severe without psychotic features: Secondary | ICD-10-CM

## 2017-06-13 DIAGNOSIS — Z87891 Personal history of nicotine dependence: Secondary | ICD-10-CM | POA: Insufficient documentation

## 2017-06-13 DIAGNOSIS — Z79899 Other long term (current) drug therapy: Secondary | ICD-10-CM | POA: Insufficient documentation

## 2017-06-13 DIAGNOSIS — C50311 Malignant neoplasm of lower-inner quadrant of right female breast: Secondary | ICD-10-CM

## 2017-06-13 DIAGNOSIS — Z923 Personal history of irradiation: Secondary | ICD-10-CM | POA: Diagnosis not present

## 2017-06-13 DIAGNOSIS — Z853 Personal history of malignant neoplasm of breast: Secondary | ICD-10-CM | POA: Diagnosis not present
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
## 2017-06-13 NOTE — Telephone Encounter (Signed)
Gave patient AVS and calendar of upcoming September appointments.  °

## 2017-07-25 ENCOUNTER — Telehealth: Payer: Self-pay | Admitting: *Deleted

## 2017-08-10 ENCOUNTER — Telehealth: Payer: Self-pay | Admitting: *Deleted

## 2017-08-10 NOTE — Telephone Encounter (Signed)
This RN spoke with per per her call - [name] wanted to inform Dr Jana Hakim as well as to inquire about her own health care maintenance.  [name] states her " biological mother has been diagnosed with colon cancer ".  Her biological mother's name is [name] and she will be seeing Dr Benay Spice on Monday 08/13/2017.  Note- [name] was legally adopted by her grandparents and not raised by her biological mother.  [name] states she will be with her biological mother at visit.  [name] stated in addition to above she is concerned related to " another family member that has a cancer and I have a lot of the same symptoms she has had "  [name] states symptoms of changes in bowels including " thinnning ", discomfort and bloating.  She is inquiring if it would be appropriate for her to obtain a colonoscopy due to this new development.  [name] does not have a GI MD preference.  This note will be reviewed with MD for further advice.

## 2017-08-16 ENCOUNTER — Telehealth: Payer: Self-pay | Admitting: *Deleted

## 2017-08-16 DIAGNOSIS — C50911 Malignant neoplasm of unspecified site of right female breast: Secondary | ICD-10-CM

## 2017-08-16 DIAGNOSIS — Z800 Family history of malignant neoplasm of digestive organs: Secondary | ICD-10-CM

## 2017-08-16 DIAGNOSIS — Z170 Estrogen receptor positive status [ER+]: Principal | ICD-10-CM

## 2017-08-16 DIAGNOSIS — K219 Gastro-esophageal reflux disease without esophagitis: Secondary | ICD-10-CM

## 2017-08-16 DIAGNOSIS — R194 Change in bowel habit: Secondary | ICD-10-CM

## 2017-08-16 DIAGNOSIS — C50311 Malignant neoplasm of lower-inner quadrant of right female breast: Secondary | ICD-10-CM

## 2017-08-16 DIAGNOSIS — R1319 Other dysphagia: Secondary | ICD-10-CM

## 2017-08-16 NOTE — Telephone Encounter (Signed)
This RN spoke with pt per MD review with recommendation for pt to proceed with GI consult for strong family history of colon and stomach cancers.  Per discussion - pt stated she would like to see Dr Silverio Decamp at [place] - note her biological mother who was diagnosed with colon cancer was seen by Dr Raquel James in the same location.  Referral placed per to above MD - [place] office notified of referral with request to call the patient with appointment.

## 2017-08-23 ENCOUNTER — Encounter: Payer: Self-pay | Admitting: *Deleted

## 2017-08-27 ENCOUNTER — Telehealth: Payer: Self-pay | Admitting: *Deleted

## 2017-08-27 NOTE — Telephone Encounter (Signed)
VM sent from triage stating pt received call from this office " but no one left a message ".  This RN did not call patient.

## 2017-08-28 NOTE — Telephone Encounter (Signed)
No entry 

## 2017-08-29 ENCOUNTER — Encounter: Payer: Self-pay | Admitting: Gastroenterology

## 2017-10-04 ENCOUNTER — Telehealth: Payer: Self-pay | Admitting: *Deleted

## 2017-10-04 NOTE — Telephone Encounter (Signed)
Faxed ROI to [facility], att: Audie Pinto; release [phone]

## 2017-10-25 ENCOUNTER — Encounter: Payer: Self-pay | Admitting: *Deleted

## 2017-11-02 ENCOUNTER — Encounter (INDEPENDENT_AMBULATORY_CARE_PROVIDER_SITE_OTHER): Payer: Self-pay

## 2017-11-02 ENCOUNTER — Telehealth: Payer: Self-pay | Admitting: *Deleted

## 2017-11-02 ENCOUNTER — Ambulatory Visit: Payer: BLUE CROSS/BLUE SHIELD | Admitting: Gastroenterology

## 2017-11-02 ENCOUNTER — Encounter: Payer: Self-pay | Admitting: Gastroenterology

## 2017-11-02 VITALS — BP 106/64 | HR 64 | Ht 63.0 in | Wt 130.0 lb

## 2017-11-02 DIAGNOSIS — R1319 Other dysphagia: Secondary | ICD-10-CM | POA: Diagnosis not present

## 2017-11-02 DIAGNOSIS — K5909 Other constipation: Secondary | ICD-10-CM | POA: Diagnosis not present

## 2017-11-02 DIAGNOSIS — K219 Gastro-esophageal reflux disease without esophagitis: Secondary | ICD-10-CM

## 2017-11-02 DIAGNOSIS — K6289 Other specified diseases of anus and rectum: Secondary | ICD-10-CM

## 2017-11-02 DIAGNOSIS — Z800 Family history of malignant neoplasm of digestive organs: Secondary | ICD-10-CM

## 2017-11-02 DIAGNOSIS — K581 Irritable bowel syndrome with constipation: Secondary | ICD-10-CM

## 2017-11-02 DIAGNOSIS — R140 Abdominal distension (gaseous): Secondary | ICD-10-CM

## 2017-11-02 MED ORDER — RANITIDINE HCL 150 MG PO TABS: 150.0000 mg | ORAL_TABLET | Freq: Two times a day (BID) | ORAL | 3 refills | Status: DC

## 2017-11-02 MED ORDER — PLENVU 140 G PO SOLR: 1.0000 | Freq: Once | ORAL | 0 refills | Status: AC

## 2017-11-02 NOTE — Telephone Encounter (Signed)
"  I'm in [facility] office.  Appointment needs scheduling for endoscopy, colonoscopy needed on [name].  Computer system will not allow scheduling with [facility] lab appointment.  Received scheduling voicemail.  Can Val reschedule lab for the next day, about the same time, 2:00 pm?"  [name].  Patient expecting call with new appointment information.  Scheduling messaged to reschedule pre-F/U lab to 12-13-2017 at 2:00 pm per patient request because GI procedure necessitated cancellation for GI procedure.

## 2017-11-02 NOTE — Progress Notes (Signed)
[name]    [id number]    Feb 06, 1971  Primary Care Physician:Magrinat, Virgie Dad, MD  Referring Physician: Chauncey Cruel, MD [address] [place], [place] [address]  Chief complaint:  Heartburn, Dysphagia, constipation, bloating, anorexia, rectal discomfort  HPI: 47 year old female with history of breast cancer, initially diagnosed in the right breast in 2012 treated with neoadjuvant chemotherapy and bilateral mastectomy, on restaging PET scan 2014 had recurrent invasive ductal carcinoma right breast status post lumpectomy here with multiple GI complaints.  Patient is extremely anxious and worried about her health.  She is under routine surveillance and has been cancer free is worried that she had recurrent cancer on staging PET/CT in Dec 2014.  Subsequently she has had multiple CTs and also PET/CT that have been negative. Complains of intermittent lower abdominal pain and constipation with erratic bowel habits.  She has occasional rectal discomfort after bowel movements especially when she strains excessively.  Denies any rectal bleeding bloating and excessive flatulence.  She is having worsening heartburn and also intermittent dysphagia with solid food in the past few months.  She occasionally feels tightness in her throat and sore throat.  Also complaining of decreased appetite but has been gaining weight recently. She takes Excedrin  almost daily for headaches She also taking hydrochloric acid with pepsin and over-the-counter supplements to help with her digestion  Biologic mother diagnosed with colon cancer at age 75    Outpatient Encounter Medications as of 11/02/2017  Medication Sig  . aspirin-acetaminophen-caffeine (EXCEDRIN MIGRAINE) 250-250-65 MG per tablet Take 2 tablets by mouth every 6 (six) hours as needed for headache.  . Biotin 5000 MCG CAPS Take 5,000 mcg by mouth daily.  . calcium citrate-vitamin D (CITRACAL+D) 315-200 MG-UNIT tablet Take  1 tablet by mouth 2 (two) times daily.  . [DISCONTINUED] Prenatal Vit-Fe Fumarate-FA (PRENATAL VITAMIN PLUS LOW IRON) 27-1 MG TABS Take 1 tablet by mouth daily.  . [DISCONTINUED] valACYclovir (VALTREX) 1000 MG tablet Take 1 tablet (1,000 mg total) by mouth 3 (three) times daily.   No facility-administered encounter medications on file as of 11/02/2017.     Allergies as of 11/02/2017 - Review Complete 11/02/2017  Allergen Reaction Noted  . Tussionex pennkinetic er [hydrocod polst-cpm polst er] Other (See Comments) 01/12/2011  . Codeine Other (See Comments) 01/12/2011  . Morphine Rash 05/22/2013  . Morphine and related Anxiety 01/12/2011  . Penicillin g Rash 05/22/2013  . Penicillins Rash 01/12/2011    Past Medical History:  Diagnosis Date  . Anxiety   . Asthma    as a child  . Breast cancer [facility]) Sept 2012   right breast, inner lower right  . Cervical dysplasia   . GERD (gastroesophageal reflux disease)    had during chemo  . Hx of radiation therapy 11/24/13- 01/08/14   reconstructed right breast/chest wall 4500 cGy 25 sessions, electron beam boost 1440 cGy 8 sessions  . Kidney stones   . Migraines   . Pneumonia   . PONV (postoperative nausea and vomiting)   . Rash    neck    Past Surgical History:  Procedure Laterality Date  . ADENOIDECTOMY    . BREAST LUMPECTOMY WITH NEEDLE LOCALIZATION Right 05/05/2013   Procedure: EXCISION RECURRENT CANCER RIGHT BREAST WITH NEEDLE LOCALOZATION;  Surgeon: Adin Hector, MD;  Location: [place];  Service: General;  Laterality: Right;  . BREAST RECONSTRUCTION  06/29/2011   Procedure: BREAST RECONSTRUCTION;  Surgeon: Theodoro Kos,  DO;  Location: [place];  Service: Plastics;  Laterality: Bilateral;  Immediate Bilateral Breast Reconstruction with Bilateral Tissue Expanders and placement of  Alloderm   . DILATION AND CURETTAGE OF UTERUS    . fibroid tumor surgery    . lazy eye     corrective surgery  . MASTECTOMY W/ SENTINEL NODE BIOPSY   06/29/2011   Procedure: MASTECTOMY WITH SENTINEL LYMPH NODE BIOPSY;  Surgeon: Adin Hector, MD;  Location: [place];  Service: General;  Laterality: Right;  right skin spairing mstectomy and right sentinel lymph node biopsy  . PORT-A-CATH REMOVAL  03/12/2012   Procedure: MINOR REMOVAL PORT-A-CATH;  Surgeon: Adin Hector, MD;  Location: [place];  Service: General;  Laterality: N/A;  Upper left  . PORTACATH PLACEMENT Left 05/05/2013   Procedure: INSERTION PORT-A-CATH;  Surgeon: Adin Hector, MD;  Location: [place];  Service: General;  Laterality: Left;  . RHINOPLASTY    . uterine cervical treatments  2002   for precancerous lesions    Family History  Adopted: Yes  Problem Relation Age of Onset  . Heart attack Father   . Cancer Cousin 40       melanoma; mat first cousin, located on neck  . Colon cancer Mother 1  . Cancer Maternal Aunt 60       stomach and ovarian as separate primaries  . Cancer Maternal Uncle 65       lung; smoker  . Cancer Maternal Uncle 81       prostate  . Cancer Maternal Grandmother 35       enodometrial  . Cancer Maternal Aunt 80       breast    Social History   Socioeconomic History  . Marital status: Married    Spouse name: Not on file  . Number of children: Not on file  . Years of education: Not on file  . Highest education level: Not on file  Occupational History  . Not on file  Social Needs  . Financial resource strain: Not on file  . Food insecurity:    Worry: Not on file    Inability: Not on file  . Transportation needs:    Medical: Not on file    Non-medical: Not on file  Tobacco Use  . Smoking status: Former Smoker    Last attempt to quit: 07/20/1996    Years since quitting: 21.3  . Smokeless tobacco: Never Used  Substance and Sexual Activity  . Alcohol use: No    Alcohol/week: 0.0 oz  . Drug use: No  . Sexual activity: Yes    Partners: Male  Lifestyle  . Physical activity:    Days per week: Not on file     Minutes per session: Not on file  . Stress: Not on file  Relationships  . Social connections:    Talks on phone: Not on file    Gets together: Not on file    Attends religious service: Not on file    Active member of club or organization: Not on file    Attends meetings of clubs or organizations: Not on file    Relationship status: Not on file  . Intimate partner violence:    Fear of current or ex partner: Not on file    Emotionally abused: Not on file    Physically abused: Not on file    Forced sexual activity: Not on file  Other Topics Concern  . Not on file  Social  History Narrative   She was adopted by her grandparents as a child.  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  She works in a Engineer, agricultural.      Review of systems: Review of Systems  Constitutional: Negative for fever and chills.  Positive for fatigue HENT: Positive for sinus trouble Eyes: Negative for blurred vision.  Respiratory: Negative for cough, shortness of breath and wheezing.   Cardiovascular: Negative for chest pain and palpitations.  Gastrointestinal: as per HPI Genitourinary: Negative for dysuria, urgency, frequency and hematuria.  Musculoskeletal: Positive for myalgias, back pain and joint pain.  Skin: Positive for itching and rash.  Neurological: Negative for dizziness, tremors, focal weakness, seizures and loss of consciousness.  Positive for headaches Endo/Heme/Allergies: Positive for seasonal allergies.  Psychiatric/Behavioral: Negative for depression, suicidal ideas and hallucinations.  Positive for anxiety All other systems reviewed and are negative.   Physical Exam: Vitals:   11/02/17 1418  BP: 106/64  Pulse: 64   Body mass index is 23.03 kg/m. Gen:      No acute distress HEENT:  EOMI, sclera anicteric Neck:     No masses; no thyromegaly Lungs:    Clear to auscultation bilaterally; normal respiratory effort CV:         Regular rate and rhythm; no  murmurs Abd:      + bowel sounds; soft, non-tender; no palpable masses, no distension Ext:    No edema; adequate peripheral perfusion Skin:      Warm and dry; no rash Neuro: alert and oriented x 3 Psych: normal mood and affect  Data Reviewed:  Reviewed labs, radiology imaging, old records and pertinent past GI work up   Assessment and Plan/Recommendations:  47 year old female with history of breast cancer 2012 status post neoadjuvant chemo, bilateral mastectomy, recurrent cancer 2015 status post lumpectomy here with complaints of worsening heartburn, intermittent solid dysphagia, constipation, bloating and rectal discomfort Family history of colon cancer in her mother at age 53 Scheduled for EGD with possible esophageal dilation and will schedule colonoscopy along with it for colorectal cancer screening The risks and benefits as well as alternatives of endoscopic procedure(s) have been discussed and reviewed. All questions answered. The patient agrees to proceed.  Heartburn secondary to GERD: Start ranitidine 150 mg twice daily as needed  Constipation: start Benefiber 1 teaspoon 3 times daily and increase fluid intake  Return as needed   Greater than 50% of the time used for counseling as well as treatment plan and follow-up. She had multiple questions which were answered to her satisfaction  K. Denzil Magnuson , MD [phone]    CC: Magrinat, Virgie Dad, MD

## 2017-11-02 NOTE — Patient Instructions (Addendum)
You have been scheduled for an endoscopy and colonoscopy. Please follow the written instructions given to you at your visit today. Please pick up your prep supplies at the pharmacy within the next 1-3 days. If you use inhalers (even only as needed), please bring them with you on the day of your procedure. Your physician has requested that you go to www.startemmi.com and enter the access code given to you at your visit today. This web site gives a general overview about your procedure. However, you should still follow specific instructions given to you by our office regarding your preparation for the procedure.  We will send zantac to your pharmacy  Use Benefiber 1 teaspoon 2-3 times a day   Follow up in 3 months   Thank you for choosing [facility]  Judith Hampshire Nandigam,MD

## 2017-11-06 ENCOUNTER — Encounter: Payer: Self-pay | Admitting: Gastroenterology

## 2017-11-19 ENCOUNTER — Telehealth: Payer: Self-pay | Admitting: Oncology

## 2017-11-19 ENCOUNTER — Telehealth: Payer: Self-pay

## 2017-11-19 NOTE — Telephone Encounter (Signed)
L/m for pt to pick up prep kit today, Pam P put out front for the patient

## 2017-11-19 NOTE — Telephone Encounter (Signed)
Patient called and rescheduled 9/5 lab to 8/30. Patient has new date/time. Also confirmed 9/11 f/u.

## 2017-11-28 ENCOUNTER — Encounter: Payer: Self-pay | Admitting: Gastroenterology

## 2017-12-07 ENCOUNTER — Inpatient Hospital Stay: Payer: BLUE CROSS/BLUE SHIELD | Attending: Oncology

## 2017-12-07 DIAGNOSIS — Z87891 Personal history of nicotine dependence: Secondary | ICD-10-CM | POA: Insufficient documentation

## 2017-12-07 DIAGNOSIS — Z923 Personal history of irradiation: Secondary | ICD-10-CM | POA: Diagnosis not present

## 2017-12-07 DIAGNOSIS — Z170 Estrogen receptor positive status [ER+]: Secondary | ICD-10-CM | POA: Insufficient documentation

## 2017-12-07 DIAGNOSIS — Z853 Personal history of malignant neoplasm of breast: Secondary | ICD-10-CM | POA: Diagnosis not present

## 2017-12-07 DIAGNOSIS — C50911 Malignant neoplasm of unspecified site of right female breast: Secondary | ICD-10-CM

## 2017-12-07 DIAGNOSIS — Z9013 Acquired absence of bilateral breasts and nipples: Secondary | ICD-10-CM | POA: Insufficient documentation

## 2017-12-07 DIAGNOSIS — Z9221 Personal history of antineoplastic chemotherapy: Secondary | ICD-10-CM | POA: Insufficient documentation

## 2017-12-07 DIAGNOSIS — Z79899 Other long term (current) drug therapy: Secondary | ICD-10-CM | POA: Diagnosis not present

## 2017-12-07 DIAGNOSIS — C50311 Malignant neoplasm of lower-inner quadrant of right female breast: Secondary | ICD-10-CM

## 2017-12-07 LAB — COMPREHENSIVE METABOLIC PANEL
ALT: 15 U/L (ref 0–44)
AST: 22 U/L (ref 15–41)
Albumin: 4.4 g/dL (ref 3.5–5.0)
Alkaline Phosphatase: 103 U/L (ref 38–126)
Anion gap: 8 (ref 5–15)
BUN: 13 mg/dL (ref 6–20)
CO2: 30 mmol/L (ref 22–32)
Calcium: 10.2 mg/dL (ref 8.9–10.3)
Chloride: 104 mmol/L (ref 98–111)
Creatinine, Ser: 0.89 mg/dL (ref 0.44–1.00)
GFR calc Af Amer: >60 mL/min (ref >60)
GFR calc non Af Amer: >60 mL/min (ref >60)
Glucose, Bld: 83 mg/dL (ref 70–99)
Potassium: 4.7 mmol/L (ref 3.5–5.1)
Sodium: 142 mmol/L (ref 135–145)
Total Bilirubin: 0.4 mg/dL (ref 0.3–1.2)
Total Protein: 6.9 g/dL (ref 6.5–8.1)

## 2017-12-07 LAB — CBC WITH DIFFERENTIAL/PLATELET
Basophils Absolute: 0 10*3/uL (ref 0.0–0.1)
Basophils Relative: 1 %
Eosinophils Absolute: 0.1 10*3/uL (ref 0.0–0.5)
Eosinophils Relative: 2 %
HCT: 39.5 % (ref 34.8–46.6)
Hemoglobin: 13.1 g/dL (ref 11.6–15.9)
Lymphocytes Relative: 27 %
Lymphs Abs: 1.6 10*3/uL (ref 0.9–3.3)
MCH: 30 pg (ref 25.1–34.0)
MCHC: 33.1 g/dL (ref 31.5–36.0)
MCV: 90.5 fL (ref 79.5–101.0)
Monocytes Absolute: 0.4 10*3/uL (ref 0.1–0.9)
Monocytes Relative: 7 %
Neutro Abs: 3.9 10*3/uL (ref 1.5–6.5)
Neutrophils Relative %: 63 %
Platelets: 257 10*3/uL (ref 145–400)
RBC: 4.36 MIL/uL (ref 3.70–5.45)
RDW: 12.5 % (ref 11.2–14.5)
WBC: 6.2 10*3/uL (ref 3.9–10.3)

## 2017-12-08 LAB — ESTRADIOL: Estradiol: <5 pg/mL

## 2017-12-08 LAB — FOLLICLE STIMULATING HORMONE: FSH: 104.2 m[IU]/mL

## 2017-12-12 ENCOUNTER — Other Ambulatory Visit: Payer: BLUE CROSS/BLUE SHIELD

## 2017-12-12 ENCOUNTER — Encounter: Payer: BLUE CROSS/BLUE SHIELD | Admitting: Gastroenterology

## 2017-12-12 ENCOUNTER — Ambulatory Visit (AMBULATORY_SURGERY_CENTER): Payer: BLUE CROSS/BLUE SHIELD | Admitting: Gastroenterology

## 2017-12-12 ENCOUNTER — Encounter: Payer: Self-pay | Admitting: Gastroenterology

## 2017-12-12 VITALS — BP 104/62 | HR 70 | Temp 97.5°F | Resp 18 | Ht 63.0 in | Wt 130.0 lb

## 2017-12-12 DIAGNOSIS — Z1211 Encounter for screening for malignant neoplasm of colon: Secondary | ICD-10-CM

## 2017-12-12 DIAGNOSIS — K5900 Constipation, unspecified: Secondary | ICD-10-CM

## 2017-12-12 DIAGNOSIS — K2970 Gastritis, unspecified, without bleeding: Secondary | ICD-10-CM

## 2017-12-12 DIAGNOSIS — R1310 Dysphagia, unspecified: Secondary | ICD-10-CM

## 2017-12-12 DIAGNOSIS — Z800 Family history of malignant neoplasm of digestive organs: Secondary | ICD-10-CM

## 2017-12-12 DIAGNOSIS — K269 Duodenal ulcer, unspecified as acute or chronic, without hemorrhage or perforation: Secondary | ICD-10-CM

## 2017-12-12 MED ORDER — SODIUM CHLORIDE 0.9 % IV SOLN: 500.0000 mL | Freq: Once | INTRAVENOUS | Status: DC

## 2017-12-12 MED ORDER — OMEPRAZOLE 20 MG PO CPDR: 20.0000 mg | DELAYED_RELEASE_CAPSULE | Freq: Every day | ORAL | 0 refills | Status: DC

## 2017-12-12 NOTE — Op Note (Signed)
[facility] Patient Name: [name] Procedure Date: 12/12/2017 2:23 PM MRN: [id number] Endoscopist: Mauri Pole , MD Age: 47 Referring MD:  Date of Birth: 12-Jan-1971 Gender: Female Account #: [id number] Procedure:                Colonoscopy Indications:              Screening in patient at increased risk: Colorectal                            cancer in mother age 40, change in bowel habits,                            constipation Medicines:                Monitored Anesthesia Care Procedure:                Pre-Anesthesia Assessment:                           - Prior to the procedure, a History and Physical                            was performed, and patient medications and                            allergies were reviewed. The patient's tolerance of                            previous anesthesia was also reviewed. The risks                            and benefits of the procedure and the sedation                            options and risks were discussed with the patient.                            All questions were answered, and informed consent                            was obtained. Prior Anticoagulants: The patient has                            taken no previous anticoagulant or antiplatelet                            agents. ASA Grade Assessment: II - A patient with                            mild systemic disease. After reviewing the risks                            and benefits, the patient was deemed in  satisfactory condition to undergo the procedure.                           After obtaining informed consent, the colonoscope                            was passed under direct vision. Throughout the                            procedure, the patient's blood pressure, pulse, and                            oxygen saturations were monitored continuously. The                            Model PCF-[id number]) scope was  introduced                            through the anus and advanced to the the cecum,                            identified by appendiceal orifice and ileocecal                            valve. The colonoscopy was performed without                            difficulty. The patient tolerated the procedure                            well. The quality of the bowel preparation was                            good. The ileocecal valve, appendiceal orifice, and                            rectum were photographed. Scope In: 2:39:17 PM Scope Out: 3:01:03 PM Scope Withdrawal Time: 0 hours 15 minutes 7 seconds  Total Procedure Duration: 0 hours 21 minutes 46 seconds  Findings:                 The perianal and digital rectal examinations were                            normal.                           Non-bleeding internal hemorrhoids were found during                            retroflexion. The hemorrhoids were small.                           The exam was otherwise without abnormality. Complications:            No immediate complications. Estimated Blood  Loss:     Estimated blood loss was minimal. Impression:               - Non-bleeding internal hemorrhoids.                           - The examination was otherwise normal.                           - No specimens collected. Recommendation:           - Patient has a contact number available for                            emergencies. The signs and symptoms of potential                            delayed complications were discussed with the                            patient. Return to normal activities tomorrow.                            Written discharge instructions were provided to the                            patient.                           - Resume previous diet.                           - Continue present medications.                           - Repeat colonoscopy in 5 years for screening                             purposes. Mauri Pole, MD 12/12/2017 3:10:36 PM This report has been signed electronically.

## 2017-12-12 NOTE — Progress Notes (Signed)
A and O x3. Report to RN. Tolerated MAC anesthesia well.

## 2017-12-12 NOTE — Patient Instructions (Signed)
YOU HAD AN ENDOSCOPIC PROCEDURE TODAY AT [facility]:   Refer to the procedure report that was given to you for any specific questions about what was found during the examination.  If the procedure report does not answer your questions, please call your gastroenterologist to clarify.  If you requested that your care partner not be given the details of your procedure findings, then the procedure report has been included in a sealed envelope for you to review at your convenience later.  YOU SHOULD EXPECT: Some feelings of bloating in the abdomen. Passage of more gas than usual.  Walking can help get rid of the air that was put into your GI tract during the procedure and reduce the bloating. If you had a lower endoscopy (such as a colonoscopy or flexible sigmoidoscopy) you may notice spotting of blood in your stool or on the toilet paper. If you underwent a bowel prep for your procedure, you may not have a normal bowel movement for a few days.  Please Note:  You might notice some irritation and congestion in your nose or some drainage.  This is from the oxygen used during your procedure.  There is no need for concern and it should clear up in a day or so.  SYMPTOMS TO REPORT IMMEDIATELY:   Following lower endoscopy (colonoscopy or flexible sigmoidoscopy):  Excessive amounts of blood in the stool  Significant tenderness or worsening of abdominal pains  Swelling of the abdomen that is new, acute  Fever of 100F or higher   Following upper endoscopy (EGD)  Vomiting of blood or coffee ground material  New chest pain or pain under the shoulder blades  Painful or persistently difficult swallowing  New shortness of breath  Fever of 100F or higher  Black, tarry-looking stools  For urgent or emergent issues, a gastroenterologist can be reached at any hour by calling [phone].   DIET:  We do recommend a small meal at first, but then you may proceed to your regular diet.  Drink  plenty of fluids but you should avoid alcoholic beverages for 24 hours.  ACTIVITY:  You should plan to take it easy for the rest of today and you should NOT DRIVE or use heavy machinery until tomorrow (because of the sedation medicines used during the test).    FOLLOW UP: Our staff will call the number listed on your records the next business day following your procedure to check on you and address any questions or concerns that you may have regarding the information given to you following your procedure. If we do not reach you, we will leave a message.  However, if you are feeling well and you are not experiencing any problems, there is no need to return our call.  We will assume that you have returned to your regular daily activities without incident.  If any biopsies were taken you will be contacted by phone or by letter within the next 1-3 weeks.  Please call us at [phone] if you have not heard about the biopsies in 3 weeks.   Await for biopsy results Use Prilosec (omeprazole) 20 mg daily for 2 months No ibuprofen, naproxen or other non-steroidal anti-inflammatory drugs for 2 weeks after polyp removal. Tylenol okay if needed. Return to GI clinic in two months Repeat upper endoscopy after studies are complete for surveillance based on pathology results   SIGNATURES/CONFIDENTIALITY: You and/or your care partner have signed paperwork which will be entered into your electronic medical  record.  These signatures attest to the fact that that the information above on your After Visit Summary has been reviewed and is understood.  Full responsibility of the confidentiality of this discharge information lies with you and/or your care-partner. 

## 2017-12-12 NOTE — Op Note (Signed)
[name] Patient Name: [name] Procedure Date: 12/12/2017 2:23 PM MRN: [id number] Endoscopist: Mauri Pole , MD Age: 47 Referring MD:  Date of Birth: 1970-12-13 Gender: Female Account #: [id number] Procedure:                Upper GI endoscopy Indications:              Dysphagia Medicines:                Monitored Anesthesia Care Procedure:                Pre-Anesthesia Assessment:                           - Prior to the procedure, a History and Physical                            was performed, and patient medications and                            allergies were reviewed. The patient's tolerance of                            previous anesthesia was also reviewed. The risks                            and benefits of the procedure and the sedation                            options and risks were discussed with the patient.                            All questions were answered, and informed consent                            was obtained. Prior Anticoagulants: The patient has                            taken no previous anticoagulant or antiplatelet                            agents. ASA Grade Assessment: II - A patient with                            mild systemic disease. After reviewing the risks                            and benefits, the patient was deemed in                            satisfactory condition to undergo the procedure.                           After obtaining informed consent, the endoscope was  passed under direct vision. Throughout the                            procedure, the patient's blood pressure, pulse, and                            oxygen saturations were monitored continuously. The                            Endoscope was introduced through the mouth, and                            advanced to the second part of duodenum. The upper                            GI endoscopy was accomplished without  difficulty.                            The patient tolerated the procedure well. Scope In: Scope Out: Findings:                 No endoscopic abnormality was evident in the                            esophagus to explain the patient's complaint of                            dysphagia.                           The Z-line was regular and was found 36 cm from the                            incisors.                           Patchy severe inflammation characterized by                            congestion (edema), erosions, friability, mucus,                            aphthous ulcerations and shallow ulcerations was                            found in the gastric antrum. Biopsies were taken                            with a cold forceps for histology. Biopsies were                            taken with a cold forceps for Helicobacter pylori                            testing.  Two non-bleeding cratered duodenal ulcers were                            found in the duodenal bulb. The largest lesion was                            less than one mm in largest dimension.                           The second portion of the duodenum was normal. Complications:            No immediate complications. Estimated Blood Loss:     Estimated blood loss was minimal. Impression:               - No endoscopic esophageal abnormality to explain                            patient's dysphagia.                           - Z-line regular, 36 cm from the incisors.                           - Gastritis. Biopsied.                           - Multiple non-bleeding duodenal ulcers.                           - Normal second portion of the duodenum. Recommendation:           - Patient has a contact number available for                            emergencies. The signs and symptoms of potential                            delayed complications were discussed with the                             patient. Return to normal activities tomorrow.                            Written discharge instructions were provided to the                            patient.                           - Resume previous diet.                           - Continue present medications.                           - Await pathology results.                           -  Repeat upper endoscopy after studies are complete                            for surveillance based on pathology results.                           - Use Prilosec (omeprazole) 20 mg PO daily for 2                            months.                           - No aspirin, ibuprofen, naproxen, or other                            non-steroidal anti-inflammatory drugs.                           - Return to GI office in 2 months. Mauri Pole, MD 12/12/2017 3:07:33 PM This report has been signed electronically.

## 2017-12-12 NOTE — Progress Notes (Signed)
Pt's states no medical or surgical changes since previsit or office visit. Also patient stated  It was alright to start IV to left arm.

## 2017-12-12 NOTE — Progress Notes (Signed)
Called to room to assist during endoscopic procedure.  Patient ID and intended procedure confirmed with present staff. Received instructions for my participation in the procedure from the performing physician.  

## 2017-12-13 ENCOUNTER — Telehealth: Payer: Self-pay

## 2017-12-13 ENCOUNTER — Other Ambulatory Visit: Payer: BLUE CROSS/BLUE SHIELD

## 2017-12-13 NOTE — Telephone Encounter (Signed)
  Follow up Call-  Call back number 12/12/2017  Post procedure Call Back phone  # [phone]  Permission to leave phone message Yes  Some recent data might be hidden     Patient questions:  Do you have a fever, pain , or abdominal swelling? No. Pain Score  0 *  Have you tolerated food without any problems? Yes.    Have you been able to return to your normal activities? Yes.    Do you have any questions about your discharge instructions: Diet   No. Medications  No. Follow up visit  No.  Do you have questions or concerns about your Care? Yes.  Patient states that she felt like her upper lip was a little swollen and after she got home she noticed that it was. She also said that there seemed to be a few tiny blistered areas under her upper lip. I told her that it was most likely from the bite guard and that it should improve in the next day or so but that she could gargle with warm salt water for relief. She also stated that the night of her prep she noticed that the back of her legs hurt, and she was unable to go back to sleep. Last night she said that just the back upper legs hurt, near her hip area. She was thinking it was from the prep. I told her that I had never heard of that but to definitely let us know if she experienced the same pain tonight. It sounds like the pain has improved from the previous night but she stated understanding about letting us know if it did not get better.   Actions: * If pain score is 4 or above: No action needed, pain <4.

## 2017-12-17 ENCOUNTER — Other Ambulatory Visit: Payer: Self-pay

## 2017-12-17 DIAGNOSIS — R1319 Other dysphagia: Secondary | ICD-10-CM

## 2017-12-18 NOTE — Progress Notes (Signed)
ID: [name]   DOB: 1970/08/06  MR#: [id number]  YQM#:[id number]   PCP: Chauncey Cruel, MD GYN: Baltazar Najjar MD SU: Fanny Skates MD OTHER MD: Theodoro Kos, [name], [name]  CHIEF COMPLAINT:  Recurrent Breast Cancer, Right  CURRENT THERAPY: Observation   BREAST CANCER HISTORY:  From the original intake note:  "[name] (goes by "[name]") is a [place] woman who, at the age of 47, was referred by Dr. Baltazar Najjar for treatment of right breast carcinoma.   1.  The patient palpated a mass in her right breast which she brought to Dr. Jacelyn Grip attention. A prior screening mammogram on 02/06/2010 had been unremarkable. The patient was then scheduled for bilateral diagnostic mammogram and right breast ultrasonography on 12/22/2010, the studies demonstrating an ill defined mass with heterogeneous calcifications in the inner lower right breast, measuring 1.7 cm. The breasts are heterogeneously dense, but there were no other mammographic abnormalities in either breast. Clinically, there was a palpable firm mass at the 5:00 position of the right breast, 5 cm from the nipple. There were no palpable abnormalities in the right axilla. Ultrasound confirmed a 1.7 cm irregular, hypoechoic mass in the same position. There were some lymph nodes noted in the right exam of, one measuring 1.6 cm with a minimally thickened cortex inferiorly.  An ultrasound-guided core biopsy on 12/23/2010 [phone]) showed an invasive ductal carcinoma, grade 2, ER positive at 88%, PR positive at 11 is %, HER-2/neu positive with a FISH ratio of 4.61, and an MIB-1 of 36%.  The patient was referred to Dr. Fanny Skates and bilateral breast MRIs were obtained 12/30/2010. This showed the mass in question in the right breast to measure 2.0 cm. In the right axilla there was a 1.8 cm lymph node with a thickened cortex. The left breast was unremarkable.  Her subsequent treatment is as detailed  below.  2.   Lashana then had a restaging PET scan late December 2014 which was positive only for a small subcutaneous nodule in the right breast lower outer quadrant. There was no other finding of concern. Ultrasound of both breasts 04/08/2013 showed a nodule in the right breast at the 4:00 position measuring 0.7 cm. The right axilla was clear. There was an area at the 7:00 position of the left breast which was of concern by palpation but showed only normal tissue.   Biopsy of the right breast mass 04/09/2013 showed an invasive ductal carcinoma, grade 2, estrogen receptor 94% positive, progesterone receptor 11% positive, with an MIB-1 of 19% and HER-2 amplified with a signals ratio of 3.75 and the number per cell of 4.50. The patient discussed the situation with with her surgeon's and on 05/05/2013 proceeded to right lumpectomy, with the final pathology (S. is a [id number]) confirming an invasive ductal carcinoma, 0.7 cm, grade 2, within a millimeter of the posterior margin, which showed skeletal muscle. A port was placed at the time of that procedure."  Her subsequent treatment is as detailed below.  INTERVAL HISTORY:  [name] returns today for follow-up of her estrogen receptor positive breast cancer. She continues under observation. She is doing well.   Arayna had upper endoscopy and colonoscopy on 12/12/2017 under Dr. Silverio Decamp.  The upper endoscopy was for evaluation of dysphagia.  There was patchy severe inflammation with edema erosions friability and aphthous ulcers in the gastric antrum.  There were 2 nonbleeding cratered duodenal ulcers in the bulb.  The largest one was less than 1  mm.  The second portion of the duodenum was negative.  Pathology from this procedure (W [id number], showed reactive gastropathy and no evidence of H. pylori, dysplasia, or malignancy.  Colonoscopy showed only nonbleeding internal hemorrhoids.  The exam was otherwise unremarkable. She was prescribed omeprazole.     REVIEW OF SYSTEMS: Soren reports that her biological mother was recently diagnosed with colorectal cancer. [name] had a colonoscopy and endoscopy. She took Excedrin for occasional headaches, but this caused her peptic ulcers. She is no longer taking the Excedrin, but she takes up to 1500 mg tylenol pills per day. She feels that she is having a UTI due to urinary pain. Prior to her colonoscopy, she took Plenvu, and she had cold chills and pain in her legs and hips that woke her up at night. She stays active and walks a couple times per week. She denies unusual headaches, visual changes, nausea, vomiting, or dizziness. There has been no unusual cough, phlegm production, or pleurisy. There has been no change in bowel or bladder habits. She denies unexplained fatigue or unexplained weight loss, bleeding, rash, or fever. A detailed review of systems was otherwise stable.    PAST MEDICAL HISTORY: Past Medical History:  Diagnosis Date  . Anxiety   . Asthma    as a child  . Breast cancer [facility]) Sept 2012   right breast, inner lower right  . Cervical dysplasia   . GERD (gastroesophageal reflux disease)    had during chemo  . Hx of radiation therapy 11/24/13- 01/08/14   reconstructed right breast/chest wall 4500 cGy 25 sessions, electron beam boost 1440 cGy 8 sessions  . Kidney stones   . Migraines   . Pneumonia   . PONV (postoperative nausea and vomiting)   . Rash    neck  1. History of prior adenoidectomy. 2. History of rhinoplasty.   3. History of multiple uterine cervical treatments for precancerous lesions, none for the past 10 years or so. 4. History of fibroid tumor surgery. 5. History of D and C. 6. History of dysplastic nevi removed by Dr. Ronnald Ramp. 7. History of right "lazy eye" corrective surgery. 8. History of approximately 10-pack-years of tobacco abuse, resolved.  9. History of possible migraines (morning headaches). History of asthma as a child.  PAST SURGICAL HISTORY: Past  Surgical History:  Procedure Laterality Date  . ADENOIDECTOMY    . BREAST LUMPECTOMY WITH NEEDLE LOCALIZATION Right 05/05/2013   Procedure: EXCISION RECURRENT CANCER RIGHT BREAST WITH NEEDLE LOCALOZATION;  Surgeon: Adin Hector, MD;  Location: [facility];  Service: General;  Laterality: Right;  . BREAST RECONSTRUCTION  06/29/2011   Procedure: BREAST RECONSTRUCTION;  Surgeon: Theodoro Kos, DO;  Location: [facility];  Service: Plastics;  Laterality: Bilateral;  Immediate Bilateral Breast Reconstruction with Bilateral Tissue Expanders and placement of  Alloderm   . DILATION AND CURETTAGE OF UTERUS    . fibroid tumor surgery    . lazy eye     corrective surgery  . MASTECTOMY W/ SENTINEL NODE BIOPSY  06/29/2011   Procedure: MASTECTOMY WITH SENTINEL LYMPH NODE BIOPSY;  Surgeon: Adin Hector, MD;  Location: [facility];  Service: General;  Laterality: Right;  right skin spairing mstectomy and right sentinel lymph node biopsy  . PORT-A-CATH REMOVAL  03/12/2012   Procedure: MINOR REMOVAL PORT-A-CATH;  Surgeon: Adin Hector, MD;  Location: [facility];  Service: General;  Laterality: N/A;  Upper left  . PORTACATH PLACEMENT Left 05/05/2013   Procedure: INSERTION PORT-A-CATH;  Surgeon: Adin Hector, MD;  Location: [facility];  Service: General;  Laterality: Left;  . RHINOPLASTY    . uterine cervical treatments  2002   for precancerous lesions    FAMILY HISTORY Family History  Adopted: Yes  Problem Relation Age of Onset  . Heart attack Father   . Cancer Cousin 40       melanoma; mat first cousin, located on neck  . Colon cancer Mother 5  . Cancer Maternal Aunt 60       stomach and ovarian as separate primaries  . Cancer Maternal Uncle 65       lung; smoker  . Cancer Maternal Uncle 38       prostate  . Cancer Maternal Grandmother 51       enodometrial  . Cancer Maternal Aunt 26       breast  :  The patient's biological father died at the age of 67 from myocardial infarction.  The  patient's biological mother is alive at age 68  The patient was adopted by her biological mother's parents.  She has 3 half-brothers.  No sisters.  One cousin on her mother's side died from melanoma at the age of 60.  There is other cancer history in the adopted side but these are not even half-brothers or sisters, they are children of her adopted parents who are really her grandparents so these would be uncles and aunts (1 lymph node cancer, 1 prostate cancer, 1 breast cancer at the age of 55). The patient's biological mother was recently diagnosed with colorectal cancer in 2019.   GYNECOLOGIC HISTORY:   (Updated 05/22/2013) She had menarche age 70 or 33.  She is GX, P1, first pregnancy to term at age 57. She stopped having periods approximately March 2013.  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  Her husband, Jurnei Latini owns a family business called [employer].  They have a son, Kellie Simmering, age 76. Romilda Joy also has a daughter, Akua Blethen, who lives in [place] and had twins in 2016.  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  The patient is not a church attender.    ADVANCED DIRECTIVES: Not in place  HEALTH MAINTENANCE:  (Updated 06/12/2013) Social History   Tobacco Use  . Smoking status: Former Smoker    Last attempt to quit: 07/20/1996    Years since quitting: 21.4  . Smokeless tobacco: Never Used  Substance Use Topics  . Alcohol use: No    Alcohol/week: 0.0 standard drinks  . Drug use: No     Colonoscopy:  Never  PAP: Oct 2014/[facility]  Bone density: Oct 2014, mild osteopenia with a T score of -1.1  Lipid panel: Not on file  Allergies  Allergen Reactions  . Tussionex Pennkinetic Er [Hydrocod Polst-Cpm Polst Er] Other (See Comments)    hallucinations  .  Codeine Other (See Comments)    unkown  . Morphine Rash  . Morphine And Related Anxiety    anxiety  . Penicillin G Rash  . Penicillins Rash    Current Outpatient Medications  Medication Sig Dispense Refill  . acetaminophen (TYLENOL) 500 MG tablet Take 1 tablet (500 mg total) by mouth every 6 (six) hours as needed. 30 tablet 0  . Biotin 5000 MCG CAPS Take 5,000  mcg by mouth daily.    . calcium citrate-vitamin D (CITRACAL+D) 315-200 MG-UNIT tablet Take 1 tablet by mouth 2 (two) times daily.    [name] omeprazole (PRILOSEC) 20 MG capsule Take 1 capsule (20 mg total) by mouth daily. 60 capsule 0   No current facility-administered medications for this visit.     OBJECTIVE: Middle-aged white woman who appears well Vitals:   12/19/17 1429  BP: 98/70  Pulse: 70  Resp: 18  Temp: 97.7 F (36.5 C)  SpO2: 100%     Body mass index is 23.03 kg/m.    ECOG FS: 0 Filed Weights   12/19/17 1429  Weight: 130 lb (59 kg)   Sclerae unicteric, EOMs intact Oropharynx clear and moist No cervical or supraclavicular adenopathy Lungs no rales or rhonchi Heart regular rate and rhythm Abd soft, nontender, positive bowel sounds MSK no focal spinal tenderness, no upper extremity lymphedema Neuro: nonfocal, well oriented, appropriate affect Breasts: Status post bilateral mastectomies with bilateral implant reconstruction.  There is no evidence of chest wall recurrence.  Both axillae are benign.  LAB RESULTS: Lab Results  Component Value Date   WBC 6.2 12/07/2017   NEUTROABS 3.9 12/07/2017   HGB 13.1 12/07/2017   HCT 39.5 12/07/2017   MCV 90.5 12/07/2017   PLT 257 12/07/2017      Chemistry      Component Value Date/Time   NA 142 12/07/2017 1417   NA 142 02/23/2017 1356   K 4.7 12/07/2017 1417   K 4.2 02/23/2017 1356   CL 104 12/07/2017 1417   CL 104 09/23/2012 1415   CO2 30 12/07/2017 1417   CO2 27 02/23/2017 1356   BUN 13 12/07/2017 1417   BUN 11.2 02/23/2017 1356   CREATININE 0.89  12/07/2017 1417   CREATININE 0.9 02/23/2017 1356      Component Value Date/Time   CALCIUM 10.2 12/07/2017 1417   CALCIUM 9.9 02/23/2017 1356   ALKPHOS 103 12/07/2017 1417   ALKPHOS 91 02/23/2017 1356   AST 22 12/07/2017 1417   AST 25 02/23/2017 1356   ALT 15 12/07/2017 1417   ALT 19 02/23/2017 1356   BILITOT 0.4 12/07/2017 1417   BILITOT 0.49 02/23/2017 1356      STUDIES: No results found.   ASSESSMENT: 47 y.o.  BRCA 1-2 negative [name], [place] woman,   (1) status post right breast lower inner quadrant biopsy in September 2012 for a clinically 2.0 cm invasive ductal carcinoma, with some enlarged Right axillary lymph nodes, the largest one of which was negative by biopsy. The tumor was grade 2, estrogen receptor positive at 88%, progesterone receptor positive at 11%, HER-2/neu positive by CISH with a ratio of 4.61, and a proliferation marker of 36%.   (2) treated in the neoadjuvant setting with Q 3 week docetaxel/ carboplatin/ trastuzumab x6, completed 05/18/2011.  Continued on trastuzumab every 3 weeks to complete one year, last dose in November 2013.  (3) s/p bilateral mastectomies with sentinel node biopsy 06/29/2011 for a residual right-sided ypT1c ypN0 invasive ductal carcinoma, grade 2, with immediate expander placement  (4) on tamoxifen as of May 2013, discontinued in early 2015 due to breast cancer recurrence   RECURRENT BREAST CANCER January 2015 (5) status post right lumpectomy with right-sided sentinel lymph node sampling 05/05/2013 for a pT1b cN0, stage IA invasive ductal carcinoma, estrogen receptor 94% positive, progesterone receptor 11% positive, with an MIB-1 of 19% and HER-2 amplification  (6)  Received TDM-1 every 3 weeks x 8 doses completed  10/09/2013; echo 08/25/2013 showed a well preserved ejection fraction  (7) adjuvant radiation therapy completed 01/08/2014  (8) started anastrozole 04/10/2014   (a) [place] and estradiol in menopausal range 02/04/2014, to be  followed every 3 months  (b) bone density 01/15/2013 showed a T score of -1.1  (c) switched to letrozole 07/07/2014, discontinued June 2016 because of arthralgias/myalgias  (d) started exemestane September 2016, stopped March 2017 because of cost issues  (e) bone density 06/17/2015 showed a T score of -1.5   (f) anastrozole resumed April 2017, discontinued by the patient sometime around April 2018  (9) genetic testing (23 genes associated with hereditary ovarian cancer through [facility], April 2015) 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  There were no deleterious mutations noted   PLAN:  Alisa will be 5 years out from her breast cancer local recurrence May 05, 2017.  We are going to see her May 06, 2017 and she will "graduate" that day and-she will get to ring the bell and she will get a certificate and also a capsule.  She will see our survivorship nurse practitioner that day.  This will offer Nikyla the opportunity of establishing herself in our survivorship practice if she wishes to do so.  We are obtaining a urinalysis today to see whether she does have a urine infection and if so we will call her in the appropriate antibiotic, most likely nitrofurantoin.  We reviewed her pathology from Dr. Silverio Decamp studies and I gave her a copy.  She understands everything was benign  I am delighted that she is doing so well.  I am sorry to hear about her biological mother and her father-in-law's cancers.  She knows to call for any other issues that may develop before the next visit  Jamori Biggar, Virgie Dad, MD  12/19/17 3:19 PM Medical Oncology and Hematology [facility] [place], [place] [address] Tel. [phone]    Fax. [phone]  Alice Rieger, am acting as scribe for Chauncey Cruel MD.  I, Lurline Del MD, have reviewed the above documentation for accuracy and completeness, and I agree with the  above.

## 2017-12-19 ENCOUNTER — Telehealth: Payer: Self-pay | Admitting: Oncology

## 2017-12-19 ENCOUNTER — Inpatient Hospital Stay: Payer: BLUE CROSS/BLUE SHIELD

## 2017-12-19 ENCOUNTER — Inpatient Hospital Stay: Payer: BLUE CROSS/BLUE SHIELD | Attending: Oncology | Admitting: Oncology

## 2017-12-19 ENCOUNTER — Other Ambulatory Visit: Payer: Self-pay | Admitting: *Deleted

## 2017-12-19 VITALS — BP 98/70 | HR 70 | Temp 97.7°F | Resp 18 | Ht 63.0 in | Wt 130.0 lb

## 2017-12-19 DIAGNOSIS — D696 Thrombocytopenia, unspecified: Secondary | ICD-10-CM

## 2017-12-19 DIAGNOSIS — Z803 Family history of malignant neoplasm of breast: Secondary | ICD-10-CM

## 2017-12-19 DIAGNOSIS — M8580 Other specified disorders of bone density and structure, unspecified site: Secondary | ICD-10-CM

## 2017-12-19 DIAGNOSIS — Z87891 Personal history of nicotine dependence: Secondary | ICD-10-CM | POA: Diagnosis not present

## 2017-12-19 DIAGNOSIS — Z808 Family history of malignant neoplasm of other organs or systems: Secondary | ICD-10-CM

## 2017-12-19 DIAGNOSIS — Z8041 Family history of malignant neoplasm of ovary: Secondary | ICD-10-CM | POA: Diagnosis not present

## 2017-12-19 DIAGNOSIS — N23 Unspecified renal colic: Secondary | ICD-10-CM | POA: Insufficient documentation

## 2017-12-19 DIAGNOSIS — C50311 Malignant neoplasm of lower-inner quadrant of right female breast: Secondary | ICD-10-CM

## 2017-12-19 DIAGNOSIS — Z9882 Breast implant status: Secondary | ICD-10-CM

## 2017-12-19 DIAGNOSIS — Z8049 Family history of malignant neoplasm of other genital organs: Secondary | ICD-10-CM

## 2017-12-19 DIAGNOSIS — Z923 Personal history of irradiation: Secondary | ICD-10-CM | POA: Diagnosis not present

## 2017-12-19 DIAGNOSIS — Z170 Estrogen receptor positive status [ER+]: Secondary | ICD-10-CM | POA: Diagnosis not present

## 2017-12-19 DIAGNOSIS — R309 Painful micturition, unspecified: Secondary | ICD-10-CM

## 2017-12-19 DIAGNOSIS — Z801 Family history of malignant neoplasm of trachea, bronchus and lung: Secondary | ICD-10-CM

## 2017-12-19 DIAGNOSIS — Z79899 Other long term (current) drug therapy: Secondary | ICD-10-CM

## 2017-12-19 DIAGNOSIS — Z800 Family history of malignant neoplasm of digestive organs: Secondary | ICD-10-CM | POA: Diagnosis not present

## 2017-12-19 DIAGNOSIS — Z8042 Family history of malignant neoplasm of prostate: Secondary | ICD-10-CM

## 2017-12-19 DIAGNOSIS — Z9221 Personal history of antineoplastic chemotherapy: Secondary | ICD-10-CM

## 2017-12-19 DIAGNOSIS — Z421 Encounter for breast reconstruction following mastectomy: Secondary | ICD-10-CM | POA: Diagnosis not present

## 2017-12-19 DIAGNOSIS — Z9013 Acquired absence of bilateral breasts and nipples: Secondary | ICD-10-CM | POA: Diagnosis not present

## 2017-12-19 DIAGNOSIS — C50911 Malignant neoplasm of unspecified site of right female breast: Secondary | ICD-10-CM

## 2017-12-19 DIAGNOSIS — Z853 Personal history of malignant neoplasm of breast: Secondary | ICD-10-CM | POA: Diagnosis present

## 2017-12-19 LAB — URINALYSIS, COMPLETE (UACMP) WITH MICROSCOPIC
Bacteria, UA: NONE SEEN
Bilirubin Urine: NEGATIVE
Glucose, UA: NEGATIVE mg/dL
Hgb urine dipstick: NEGATIVE
Ketones, ur: NEGATIVE mg/dL
Leukocytes, UA: NEGATIVE
Nitrite: NEGATIVE
Protein, ur: NEGATIVE mg/dL
Specific Gravity, Urine: 1.004 — ABNORMAL LOW (ref 1.005–1.030)
pH: 7 (ref 5.0–8.0)

## 2017-12-19 NOTE — Telephone Encounter (Signed)
Gave avs and calendar ° °

## 2017-12-20 ENCOUNTER — Ambulatory Visit: Payer: BLUE CROSS/BLUE SHIELD

## 2017-12-20 VITALS — BP 104/69 | HR 121 | Wt 129.8 lb

## 2017-12-20 DIAGNOSIS — N760 Acute vaginitis: Secondary | ICD-10-CM

## 2017-12-20 LAB — URINE CULTURE: Culture: NO GROWTH

## 2017-12-20 NOTE — Progress Notes (Signed)
Pt is here with c/o a foul odor and discharge for a couple days. Pt states she was at the cancer center yesterday for an appointment and mentioned that she was having some back pain so they sent her urine off for culture, still awaiting those results. Pt is doing a self swab today to rule out any vaginal issues.

## 2017-12-21 ENCOUNTER — Telehealth: Payer: Self-pay

## 2017-12-21 LAB — CERVICOVAGINAL ANCILLARY ONLY
Bacterial vaginitis: NEGATIVE
Candida vaginitis: NEGATIVE
Chlamydia: NEGATIVE
Neisseria Gonorrhea: NEGATIVE
Trichomonas: NEGATIVE

## 2017-12-21 NOTE — Telephone Encounter (Signed)
Nurse returned call to patient.  Patient inquiring about urine culture results.  Informed patient of normal results.    Patient continues with discomfort and itching in vaginal area.  Nurse encouraged patient to drink fluids.  Patient is awaiting results from OBGYN office as well, encouraged patient to follow up with office for further recommendations.    Patient voiced understanding and agreement. No further needs at this time.

## 2017-12-24 ENCOUNTER — Telehealth: Payer: Self-pay

## 2017-12-24 ENCOUNTER — Encounter: Payer: Self-pay | Admitting: Gastroenterology

## 2017-12-24 NOTE — Telephone Encounter (Signed)
Return call to pt regarding results  Pt made aware results are Negative Pt voiced understanding.

## 2017-12-31 ENCOUNTER — Encounter: Payer: Self-pay | Admitting: Obstetrics

## 2017-12-31 ENCOUNTER — Ambulatory Visit (INDEPENDENT_AMBULATORY_CARE_PROVIDER_SITE_OTHER): Payer: BLUE CROSS/BLUE SHIELD | Admitting: Obstetrics

## 2017-12-31 VITALS — BP 106/71 | HR 72 | Ht 64.0 in | Wt 131.0 lb

## 2017-12-31 DIAGNOSIS — Z124 Encounter for screening for malignant neoplasm of cervix: Secondary | ICD-10-CM

## 2017-12-31 DIAGNOSIS — Z1151 Encounter for screening for human papillomavirus (HPV): Secondary | ICD-10-CM | POA: Diagnosis not present

## 2017-12-31 DIAGNOSIS — L309 Dermatitis, unspecified: Secondary | ICD-10-CM

## 2017-12-31 DIAGNOSIS — Z01419 Encounter for gynecological examination (general) (routine) without abnormal findings: Secondary | ICD-10-CM

## 2017-12-31 DIAGNOSIS — Z0000 Encounter for general adult medical examination without abnormal findings: Secondary | ICD-10-CM

## 2017-12-31 DIAGNOSIS — N898 Other specified noninflammatory disorders of vagina: Secondary | ICD-10-CM

## 2017-12-31 DIAGNOSIS — N941 Unspecified dyspareunia: Secondary | ICD-10-CM

## 2017-12-31 DIAGNOSIS — E2839 Other primary ovarian failure: Secondary | ICD-10-CM

## 2017-12-31 DIAGNOSIS — R102 Pelvic and perineal pain: Secondary | ICD-10-CM

## 2017-12-31 DIAGNOSIS — Z853 Personal history of malignant neoplasm of breast: Secondary | ICD-10-CM

## 2017-12-31 MED ORDER — HYDROCORTISONE 2.5 % EX CREA: TOPICAL_CREAM | Freq: Two times a day (BID) | CUTANEOUS | 4 refills | Status: DC | PRN

## 2017-12-31 MED ORDER — LIDOCAINE-PRILOCAINE 2.5-2.5 % EX CREA: 1.0000 "application " | TOPICAL_CREAM | Freq: Two times a day (BID) | CUTANEOUS | 4 refills | Status: DC | PRN

## 2017-12-31 MED ORDER — PRENATAL PLUS 27-1 MG PO TABS: 1.0000 | ORAL_TABLET | Freq: Every day | ORAL | 11 refills | Status: DC

## 2017-12-31 NOTE — Progress Notes (Signed)
Patient presents for Her Annual Exam today.  Mammogram:N/A had surgery dos not need mammograms anymore  STD Screening:BV and Yeast only  Last pap: 08/02/2016 WNL   C/O: pt states she is still experiencing vaginal itching wants to discuss treatment medication. Pt did a self swab on 12/20/17 Was neg.  Hair loss possibly due to menopause.   Also notes abdominal bloating , and pelvic pressure off/on.   Wants Rx for PNV .wants a more "natural PNV"

## 2018-01-01 ENCOUNTER — Encounter: Payer: Self-pay | Admitting: Obstetrics

## 2018-01-01 ENCOUNTER — Inpatient Hospital Stay: Admission: RE | Admit: 2018-01-01 | Payer: BLUE CROSS/BLUE SHIELD | Source: Ambulatory Visit

## 2018-01-01 ENCOUNTER — Ambulatory Visit
Admission: RE | Admit: 2018-01-01 | Discharge: 2018-01-01 | Disposition: A | Discharge status: Home / Self Care | Payer: BLUE CROSS/BLUE SHIELD | Source: Ambulatory Visit | Attending: Obstetrics | Admitting: Obstetrics

## 2018-01-01 DIAGNOSIS — R102 Pelvic and perineal pain: Secondary | ICD-10-CM

## 2018-01-01 LAB — CERVICOVAGINAL ANCILLARY ONLY
Bacterial vaginitis: NEGATIVE
Candida vaginitis: NEGATIVE

## 2018-01-01 NOTE — Progress Notes (Signed)
Subjective:        [name] is a 47 y.o. female here for a routine exam.  Current complaints: Vaginal dryness and itching.  Pain with intercourse.  Personal health questionnaire:  Is patient Ashkenazi Jewish, have a family history of breast and/or ovarian cancer: no Is there a family history of uterine cancer diagnosed at age < 45, gastrointestinal cancer, urinary tract cancer, family member who is a Field seismologist syndrome-associated carrier: no Is the patient overweight and hypertensive, family history of diabetes, personal history of gestational diabetes, preeclampsia or PCOS: no Is patient over 4, have PCOS,  family history of premature CHD under age 70, diabetes, smoke, have hypertension or peripheral artery disease:  no At any time, has a partner hit, kicked or otherwise hurt or frightened you?: no Over the past 2 weeks, have you felt down, depressed or hopeless?: no Over the past 2 weeks, have you felt little interest or pleasure in doing things?:no   Gynecologic History Patient's last menstrual period was 03/11/2011. Contraception: none Last Pap: 2018. Results were: normal Last mammogram: unknown. Results were: unknown  Obstetric History OB History  No data available    Past Medical History:  Diagnosis Date  . Anxiety   . Asthma    as a child  . Breast cancer [facility]) Sept 2012   right breast, inner lower right  . Cervical dysplasia   . GERD (gastroesophageal reflux disease)    had during chemo  . Hx of radiation therapy 11/24/13- 01/08/14   reconstructed right breast/chest wall 4500 cGy 25 sessions, electron beam boost 1440 cGy 8 sessions  . Kidney stones   . Migraines   . Pneumonia   . PONV (postoperative nausea and vomiting)   . Rash    neck    Past Surgical History:  Procedure Laterality Date  . ADENOIDECTOMY    . BREAST LUMPECTOMY WITH NEEDLE LOCALIZATION Right 05/05/2013   Procedure: EXCISION RECURRENT CANCER RIGHT BREAST WITH NEEDLE LOCALOZATION;   Surgeon: Adin Hector, MD;  Location: [place];  Service: General;  Laterality: Right;  . BREAST RECONSTRUCTION  06/29/2011   Procedure: BREAST RECONSTRUCTION;  Surgeon: Theodoro Kos, DO;  Location: [place];  Service: Plastics;  Laterality: Bilateral;  Immediate Bilateral Breast Reconstruction with Bilateral Tissue Expanders and placement of  Alloderm   . DILATION AND CURETTAGE OF UTERUS    . fibroid tumor surgery    . lazy eye     corrective surgery  . MASTECTOMY W/ SENTINEL NODE BIOPSY  06/29/2011   Procedure: MASTECTOMY WITH SENTINEL LYMPH NODE BIOPSY;  Surgeon: Adin Hector, MD;  Location: [place];  Service: General;  Laterality: Right;  right skin spairing mstectomy and right sentinel lymph node biopsy  . PORT-A-CATH REMOVAL  03/12/2012   Procedure: MINOR REMOVAL PORT-A-CATH;  Surgeon: Adin Hector, MD;  Location: [place];  Service: General;  Laterality: N/A;  Upper left  . PORTACATH PLACEMENT Left 05/05/2013   Procedure: INSERTION PORT-A-CATH;  Surgeon: Adin Hector, MD;  Location: [place];  Service: General;  Laterality: Left;  . RHINOPLASTY    . uterine cervical treatments  2002   for precancerous lesions     Current Outpatient Medications:  .  acetaminophen (TYLENOL) 500 MG tablet, Take 1 tablet (500 mg total) by mouth every 6 (six) hours as needed., Disp: 30 tablet, Rfl: 0 .  Biotin 5000 MCG CAPS, Take 5,000 mcg by mouth daily., Disp: , Rfl:  .  calcium citrate-vitamin  D (CITRACAL+D) 315-200 MG-UNIT tablet, Take 1 tablet by mouth 2 (two) times daily., Disp: , Rfl:  .  hydrocortisone 2.5 % cream, Apply topically 2 (two) times daily as needed., Disp: 30 g, Rfl: 4 .  lidocaine-prilocaine (EMLA) cream, Apply 1 application topically 2 (two) times daily as needed., Disp: 30 g, Rfl: 4 .  omeprazole (PRILOSEC) 20 MG capsule, Take 1 capsule (20 mg total) by mouth daily. (Patient not taking: Reported on 12/31/2017), Disp: 60 capsule, Rfl: 0 .  prenatal vitamin w/FE, FA  (PRENATAL 1 + 1) 27-1 MG TABS tablet, Take 1 tablet by mouth daily before breakfast., Disp: 30 each, Rfl: 11 Allergies  Allergen Reactions  . Tussionex Pennkinetic Er [Hydrocod Polst-Cpm Polst Er] Other (See Comments)    hallucinations  . Codeine Other (See Comments)    unkown  . Morphine Rash  . Morphine And Related Anxiety    anxiety  . Penicillin G Rash  . Penicillins Rash    Social History   Tobacco Use  . Smoking status: Former Smoker    Last attempt to quit: 07/20/1996    Years since quitting: 21.4  . Smokeless tobacco: Never Used  Substance Use Topics  . Alcohol use: No    Alcohol/week: 0.0 standard drinks    Family History  Adopted: Yes  Problem Relation Age of Onset  . Heart attack Father   . Cancer Cousin 40       melanoma; mat first cousin, located on neck  . Colon cancer Mother 31  . Cancer Maternal Aunt 60       stomach and ovarian as separate primaries  . Cancer Maternal Uncle 65       lung; smoker  . Cancer Maternal Uncle 5       prostate  . Cancer Maternal Grandmother 33       enodometrial  . Cancer Maternal Aunt 55       breast      Review of Systems  Constitutional: negative for fatigue and weight loss Respiratory: negative for cough and wheezing Cardiovascular: negative for chest pain, fatigue and palpitations Gastrointestinal: positive for abdominal pain, and negative for change in bowel habits Musculoskeletal:negative for myalgias Neurological: negative for gait problems and tremors Behavioral/Psych: negative for abusive relationship, depression Endocrine: negative for temperature intolerance    Genitourinary:positive for vaginal dryness and irritation.  Pain with intercourse and also has RLQ pain and abdominal bloating Integument/breast: negative for breast lump, breast tenderness, nipple discharge and skin lesion(s)    Objective:       BP 106/71   Pulse 72   Ht 5\' 4"  (1.626 m)   Wt 131 lb (59.4 kg)   LMP 03/11/2011   BMI 22.49  kg/m  General:   alert  Skin:   no rash or abnormalities  Lungs:   clear to auscultation bilaterally  Heart:   regular rate and rhythm, S1, S2 normal, no murmur, click, rub or gallop  Breasts:   normal without suspicious masses, skin or nipple changes or axillary nodes  Abdomen:  normal findings: no organomegaly, soft, non-tender and no hernia  Pelvis:  External genitalia: normal general appearance Urinary system: urethral meatus normal and bladder without fullness, nontender Vaginal: normal without tenderness, induration or masses Cervix: normal appearance Adnexa: normal bimanual exam Uterus: anteverted and non-tender, normal size   Lab Review Urine pregnancy test Labs reviewed yes Radiologic studies reviewed yes  50% of 20 min visit spent on counseling and coordination of care.  Assessment:     1. Encounter for gynecological examination without abnormal finding  2. Screening for cervical cancer Rx: - Cytology - PAP  3. Vaginal discharge Rx: - Cervicovaginal ancillary only  4. Hypoestrogenism Rx: - DG BONE DENSITY (DXA); Future - recommended Ca++, Mg++, and Vitamin D supplementation  5. Vaginal irritation Rx: - lidocaine-prilocaine (EMLA) cream; Apply 1 application topically 2 (two) times daily as needed.  Dispense: 30 g; Refill: 4  6. Dyspareunia, female - Vagisil recommended  7. Pelvic pain Rx: - US PELVIC COMPLETE WITH TRANSVAGINAL; Future  8. Dermatitis Rx: - hydrocortisone 2.5 % cream; Apply topically 2 (two) times daily as needed.  Dispense: 30 g; Refill: 4  9. Routine health maintenance Rx: - prenatal vitamin w/FE, FA (PRENATAL 1 + 1) 27-1 MG TABS tablet; Take 1 tablet by mouth daily before breakfast.  Dispense: 30 each; Refill: 11  10. Hx of breast cancer - doing well, in remission    Plan:    Education reviewed: calcium supplements, depression evaluation, low fat, low cholesterol diet, safe sex/STD prevention, self breast exams and weight  bearing exercise. Follow up in: 1 year.   Meds ordered this encounter  Medications  . lidocaine-prilocaine (EMLA) cream    Sig: Apply 1 application topically 2 (two) times daily as needed.    Dispense:  30 g    Refill:  4  . hydrocortisone 2.5 % cream    Sig: Apply topically 2 (two) times daily as needed.    Dispense:  30 g    Refill:  4  . prenatal vitamin w/FE, FA (PRENATAL 1 + 1) 27-1 MG TABS tablet    Sig: Take 1 tablet by mouth daily before breakfast.    Dispense:  30 each    Refill:  11   Orders Placed This Encounter  Procedures  . US PELVIC COMPLETE WITH TRANSVAGINAL    Wt. 131 Ins. bcbs No needs FH w Aneetra epic order Pt aware of prep and 301 location    Standing Status:   Future    Standing Expiration Date:   03/03/2019    Order Specific Question:   Reason for Exam (SYMPTOM  OR DIAGNOSIS REQUIRED)    Answer:   Pelvic pain    Order Specific Question:   Preferred imaging location?    Answer:   [facility]  . DG BONE DENSITY (DXA)    INS-BCBS PF 06/17/15 @ [facility]/WT 131 LBS/NO CALC SUPP/NO NEEDS/BC ANEETRA @ [facility] COSIGN REQ    Standing Status:   Future    Standing Expiration Date:   03/03/2019    Order Specific Question:   Reason for Exam (SYMPTOM  OR DIAGNOSIS REQUIRED)    Answer:   Hypoestrogenism from Breast CA treatment.    Order Specific Question:   Is the patient pregnant?    Answer:   No    Order Specific Question:   Preferred imaging location?    Answer:   [facility] Simona Huh MD 01-01-2018

## 2018-01-02 ENCOUNTER — Ambulatory Visit
Admission: RE | Admit: 2018-01-02 | Discharge: 2018-01-02 | Disposition: A | Discharge status: Home / Self Care | Payer: BLUE CROSS/BLUE SHIELD | Source: Ambulatory Visit | Attending: Obstetrics | Admitting: Obstetrics

## 2018-01-02 DIAGNOSIS — E2839 Other primary ovarian failure: Secondary | ICD-10-CM

## 2018-01-02 LAB — CYTOLOGY - PAP
Diagnosis: NEGATIVE
HPV: NOT DETECTED

## 2018-01-04 ENCOUNTER — Telehealth: Payer: Self-pay

## 2018-01-04 NOTE — Telephone Encounter (Signed)
Returned call, left vm.

## 2018-01-04 NOTE — Telephone Encounter (Signed)
TC from pt requesting recent test results. Informed pt no further testing was recommended based on the report; however, the referring MD is out of office and will not be able to review it until Monday. Pt requests to mail results. Informed pt can not mail results until after it's reviewed by the provider.

## 2018-02-01 ENCOUNTER — Encounter: Payer: Self-pay | Admitting: Licensed Clinical Social Worker

## 2018-02-01 DIAGNOSIS — Z1379 Encounter for other screening for genetic and chromosomal anomalies: Secondary | ICD-10-CM

## 2018-02-01 HISTORY — DX: Encounter for other screening for genetic and chromosomal anomalies: Z13.79

## 2018-02-01 NOTE — Progress Notes (Signed)
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

## 2018-02-06 ENCOUNTER — Ambulatory Visit: Payer: BLUE CROSS/BLUE SHIELD | Admitting: Gastroenterology

## 2018-03-18 ENCOUNTER — Telehealth: Payer: Self-pay | Admitting: Oncology

## 2018-03-18 NOTE — Telephone Encounter (Signed)
Patient called to reschedule due to insurance before the end of the year

## 2018-03-19 ENCOUNTER — Encounter

## 2018-03-19 ENCOUNTER — Ambulatory Visit (INDEPENDENT_AMBULATORY_CARE_PROVIDER_SITE_OTHER): Payer: BLUE CROSS/BLUE SHIELD | Admitting: Gastroenterology

## 2018-03-19 VITALS — BP 108/72 | HR 65 | Ht 63.0 in

## 2018-03-19 DIAGNOSIS — R140 Abdominal distension (gaseous): Secondary | ICD-10-CM | POA: Diagnosis not present

## 2018-03-19 DIAGNOSIS — K588 Other irritable bowel syndrome: Secondary | ICD-10-CM

## 2018-03-19 DIAGNOSIS — K269 Duodenal ulcer, unspecified as acute or chronic, without hemorrhage or perforation: Secondary | ICD-10-CM | POA: Diagnosis not present

## 2018-03-19 DIAGNOSIS — K2960 Other gastritis without bleeding: Secondary | ICD-10-CM

## 2018-03-19 DIAGNOSIS — K219 Gastro-esophageal reflux disease without esophagitis: Secondary | ICD-10-CM | POA: Diagnosis not present

## 2018-03-19 DIAGNOSIS — Z791 Long term (current) use of non-steroidal anti-inflammatories (NSAID): Secondary | ICD-10-CM

## 2018-03-19 DIAGNOSIS — T39395A Adverse effect of other nonsteroidal anti-inflammatory drugs [NSAID], initial encounter: Secondary | ICD-10-CM

## 2018-03-19 MED ORDER — SUCRALFATE 1 G PO TABS: 1.0000 g | ORAL_TABLET | Freq: Three times a day (TID) | ORAL | 1 refills | Status: DC

## 2018-03-19 MED ORDER — PANTOPRAZOLE SODIUM 40 MG PO TBEC: 40.0000 mg | DELAYED_RELEASE_TABLET | Freq: Every day | ORAL | 1 refills | Status: DC

## 2018-03-19 NOTE — Patient Instructions (Addendum)
Take IBgard as directed   Milderd Meager, MD at [place], [place] [address]   [phone]  Take Protonix 40 mg daily for 2 months  Take Carafate 1 table three times a day for 2 months  If you are age 48 or older, your body mass index should be between 23-30. Your Body mass index is 23.21 kg/m. If this is out of the aforementioned range listed, please consider follow up with your Primary Care Provider.  If you are age 57 or younger, your body mass index should be between 19-25. Your Body mass index is 23.21 kg/m. If this is out of the aformentioned range listed, please consider follow up with your Primary Care Provider.

## 2018-03-19 NOTE — Progress Notes (Signed)
[name]    [id number]    1970/07/25  Primary Care Physician:Magrinat, Virgie Dad, MD  Referring Physician: Chauncey Cruel, MD [address] [place], [place] [address]  Chief complaint: Dysphagia, IBS  HPI: 47 year old female with history of breast CA X2 status post bilateral mastectomy and chemotherapy.  EGD December 12, 2017: Showed severe gastritis with superficial antral ulcers and duodenal ulcers.  Gastric biopsies negative for H. Pylori Colonoscopy December 12, 2017: Showed internal hemorrhoids otherwise normal exam  Patient was recommended to start Prilosec after EGD but she did not take it as she was worried about potential side effects.  She is requesting to switch to Protonix. She continues to take Excedrin for headaches She has excessive bloating, burping and flatulence worse on some days Dysphagia has improved and no longer having trouble swallowing other than occasional episodes.  She thinks she may have a thyroid problem which is causing the trouble swallowing.  Tried to reassure her that TSH was normal and based on CT scan she had in the past there was no thyroid enlargement.   She is extremely anxious, worried about her overall health.  She has generalized myalgia.  Patient kept asking me if she has edema in her legs even though I assured her that there was no edema.  She wanted to know why her legs swell up towards the end of the day.  Advised her to discuss with her PMD if she still has questions or concerns as is outside my specialty. Denies any constipation, diarrhea, abdominal pain, nausea, vomiting, dark stool or blood in stool.  Outpatient Encounter Medications as of 03/19/2018  Medication Sig  . acetaminophen (TYLENOL) 500 MG tablet Take 1 tablet (500 mg total) by mouth every 6 (six) hours as needed.  . Biotin 5000 MCG CAPS Take 5,000 mcg by mouth daily.  . calcium citrate-vitamin D (CITRACAL+D) 315-200 MG-UNIT tablet Take 1 tablet  by mouth 2 (two) times daily.  . [DISCONTINUED] hydrocortisone 2.5 % cream Apply topically 2 (two) times daily as needed.  . [DISCONTINUED] lidocaine-prilocaine (EMLA) cream Apply 1 application topically 2 (two) times daily as needed.  . [DISCONTINUED] omeprazole (PRILOSEC) 20 MG capsule Take 1 capsule (20 mg total) by mouth daily. (Patient not taking: Reported on 12/31/2017)  . [DISCONTINUED] prenatal vitamin w/FE, FA (PRENATAL 1 + 1) 27-1 MG TABS tablet Take 1 tablet by mouth daily before breakfast.   No facility-administered encounter medications on file as of 03/19/2018.     Allergies as of 03/19/2018 - Review Complete 03/19/2018  Allergen Reaction Noted  . Tussionex pennkinetic er [hydrocod polst-cpm polst er] Other (See Comments) 01/12/2011  . Codeine Other (See Comments) 01/12/2011  . Morphine Rash 05/22/2013  . Morphine and related Anxiety 01/12/2011  . Penicillin g Rash 05/22/2013  . Penicillins Rash 01/12/2011    Past Medical History:  Diagnosis Date  . Anxiety   . Asthma    as a child  . Breast cancer [facility]) Sept 2012   right breast, inner lower right  . Cervical dysplasia   . Genetic testing 02/01/2018   Genetic testing was performed in 2015. The [employer] panel was ordered. In total, 23 genes were analyzed as part of this panel: ATM, BARD1, BRCA1, BRCA2, BRIP1, CDH1, CHEK2, EPCAM, MLH1, MRE11A, MSH2, MSH6, MUTYH, NBN, NF1, PALB2, PMS2, PTEN, RAD50, RAD51C, RAD51D, STK11, and TP53. No pathogenic variants were detected. Genetic testing did detect a Variant of Unknown  Significance (VUS) at the t  . GERD (gastroesophageal reflux disease)    had during chemo  . Hx of radiation therapy 11/24/13- 01/08/14   reconstructed right breast/chest wall 4500 cGy 25 sessions, electron beam boost 1440 cGy 8 sessions  . Kidney stones   . Migraines   . Pneumonia   . PONV (postoperative nausea and vomiting)   . Rash    neck    Past Surgical History:  Procedure Laterality Date  .  ADENOIDECTOMY    . BREAST LUMPECTOMY WITH NEEDLE LOCALIZATION Right 05/05/2013   Procedure: EXCISION RECURRENT CANCER RIGHT BREAST WITH NEEDLE LOCALOZATION;  Surgeon: Adin Hector, MD;  Location: [place];  Service: General;  Laterality: Right;  . BREAST RECONSTRUCTION  06/29/2011   Procedure: BREAST RECONSTRUCTION;  Surgeon: Theodoro Kos, DO;  Location: [place];  Service: Plastics;  Laterality: Bilateral;  Immediate Bilateral Breast Reconstruction with Bilateral Tissue Expanders and placement of  Alloderm   . DILATION AND CURETTAGE OF UTERUS    . fibroid tumor surgery    . lazy eye     corrective surgery  . MASTECTOMY W/ SENTINEL NODE BIOPSY  06/29/2011   Procedure: MASTECTOMY WITH SENTINEL LYMPH NODE BIOPSY;  Surgeon: Adin Hector, MD;  Location: [place];  Service: General;  Laterality: Right;  right skin spairing mstectomy and right sentinel lymph node biopsy  . PORT-A-CATH REMOVAL  03/12/2012   Procedure: MINOR REMOVAL PORT-A-CATH;  Surgeon: Adin Hector, MD;  Location: [place];  Service: General;  Laterality: N/A;  Upper left  . PORTACATH PLACEMENT Left 05/05/2013   Procedure: INSERTION PORT-A-CATH;  Surgeon: Adin Hector, MD;  Location: [place];  Service: General;  Laterality: Left;  . RHINOPLASTY    . uterine cervical treatments  2002   for precancerous lesions    Family History  Adopted: Yes  Problem Relation Age of Onset  . Heart attack Father   . Cancer Cousin 40       melanoma; mat first cousin, located on neck  . Colon cancer Mother 34  . Cancer Maternal Aunt 60       stomach and ovarian as separate primaries  . Cancer Maternal Uncle 65       lung; smoker  . Cancer Maternal Uncle 51       prostate  . Cancer Maternal Grandmother 60       enodometrial  . Cancer Maternal Aunt 66       breast    Social History   Socioeconomic History  . Marital status: Married    Spouse name: Not on file  . Number of children: Not on file  . Years of  education: Not on file  . Highest education level: Not on file  Occupational History  . Not on file  Social Needs  . Financial resource strain: Not on file  . Food insecurity:    Worry: Not on file    Inability: Not on file  . Transportation needs:    Medical: Not on file    Non-medical: Not on file  Tobacco Use  . Smoking status: Former Smoker    Last attempt to quit: 07/20/1996    Years since quitting: 21.6  . Smokeless tobacco: Never Used  Substance and Sexual Activity  . Alcohol use: No    Alcohol/week: 0.0 standard drinks  . Drug use: No  . Sexual activity: Yes    Partners: Male  Lifestyle  . Physical activity:    Days  per week: Not on file    Minutes per session: Not on file  . Stress: Not on file  Relationships  . Social connections:    Talks on phone: Not on file    Gets together: Not on file    Attends religious service: Not on file    Active member of club or organization: Not on file    Attends meetings of clubs or organizations: Not on file    Relationship status: Not on file  . Intimate partner violence:    Fear of current or ex partner: Not on file    Emotionally abused: Not on file    Physically abused: Not on file    Forced sexual activity: Not on file  Other Topics Concern  . Not on file  Social History Narrative   She was adopted by her grandparents as a child.  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  She works in a Engineer, agricultural.      Review of systems: Review of Systems  Constitutional: Negative for fever and chills.  Positive for lack of energy and motivation HENT: Positive for sinus problems Eyes: Negative for blurred vision.  Respiratory: Negative for cough, shortness of breath and wheezing.   Cardiovascular: Negative for chest pain and palpitations.  Gastrointestinal: as per HPI Genitourinary: Negative for dysuria, urgency, frequency and hematuria.  Musculoskeletal: Positive for myalgias, back pain and  joint pain.  Skin: Negative for itching and rash.  Neurological: Negative for dizziness, tremors, focal weakness, seizures and loss of consciousness.  Endo/Heme/Allergies: Positive for seasonal allergies.   Psychiatric/Behavioral: Negative for depression, suicidal ideas and hallucinations.  All other systems reviewed and are negative.   Physical Exam: Vitals:   03/19/18 1417  BP: 108/72  Pulse: 65   Body mass index is 23.21 kg/m. Gen:      No acute distress HEENT:  EOMI, sclera anicteric Neck:     No masses; no thyromegaly Lungs:    Clear to auscultation bilaterally; normal respiratory effort CV:         Regular rate and rhythm; no murmurs Abd:      + bowel sounds; soft, non-tender; no palpable masses, no distension Ext:    No edema; adequate peripheral perfusion Skin:      Warm and dry; no rash Neuro: alert and oriented x 3 Psych: normal mood and affect  Data Reviewed:  Reviewed labs, radiology imaging, old records and pertinent past GI work up   Assessment and Plan/Recommendations:  47 year old female with history of breast cancer 2012, 2014 status post bilateral mastectomy and chemotherapy. Severe gastritis with gastric and duodenal ulcers likely secondary to chronic NSAID use Advised patient to avoid NSAIDs Start Protonix 40 mg daily Carafate 1 g twice daily  IBS with generalized abdominal bloating: IBgard 1 capsule up to 3 times daily as needed  Patient is switching her insurance, according to her [facility] does not participate in it and [facility] is preferred.  Patient requesting to transfer her care to [facility].  No follow-up appointment scheduled  >40 minutes was spent face-to-face with the patient. Greater than 50% of the time used for counseling as well as treatment plan and follow-up. She had multiple questions which were answered to her satisfaction  K. Denzil Magnuson , MD [phone]    CC: Magrinat, Virgie Dad, MD

## 2018-03-21 ENCOUNTER — Encounter: Payer: Self-pay | Admitting: Gastroenterology

## 2018-04-01 ENCOUNTER — Inpatient Hospital Stay: Payer: BLUE CROSS/BLUE SHIELD | Attending: Oncology

## 2018-04-01 ENCOUNTER — Other Ambulatory Visit: Payer: Self-pay | Admitting: *Deleted

## 2018-04-01 DIAGNOSIS — Z808 Family history of malignant neoplasm of other organs or systems: Secondary | ICD-10-CM | POA: Diagnosis not present

## 2018-04-01 DIAGNOSIS — Z87891 Personal history of nicotine dependence: Secondary | ICD-10-CM | POA: Insufficient documentation

## 2018-04-01 DIAGNOSIS — Z801 Family history of malignant neoplasm of trachea, bronchus and lung: Secondary | ICD-10-CM | POA: Insufficient documentation

## 2018-04-01 DIAGNOSIS — Z170 Estrogen receptor positive status [ER+]: Secondary | ICD-10-CM | POA: Diagnosis not present

## 2018-04-01 DIAGNOSIS — Z79811 Long term (current) use of aromatase inhibitors: Secondary | ICD-10-CM | POA: Diagnosis not present

## 2018-04-01 DIAGNOSIS — C50311 Malignant neoplasm of lower-inner quadrant of right female breast: Secondary | ICD-10-CM | POA: Insufficient documentation

## 2018-04-01 DIAGNOSIS — Z421 Encounter for breast reconstruction following mastectomy: Secondary | ICD-10-CM | POA: Diagnosis not present

## 2018-04-01 DIAGNOSIS — Z923 Personal history of irradiation: Secondary | ICD-10-CM | POA: Insufficient documentation

## 2018-04-01 DIAGNOSIS — Z8049 Family history of malignant neoplasm of other genital organs: Secondary | ICD-10-CM | POA: Diagnosis not present

## 2018-04-01 DIAGNOSIS — Z800 Family history of malignant neoplasm of digestive organs: Secondary | ICD-10-CM | POA: Diagnosis not present

## 2018-04-01 DIAGNOSIS — Z9221 Personal history of antineoplastic chemotherapy: Secondary | ICD-10-CM | POA: Diagnosis not present

## 2018-04-01 DIAGNOSIS — Z803 Family history of malignant neoplasm of breast: Secondary | ICD-10-CM | POA: Insufficient documentation

## 2018-04-01 DIAGNOSIS — L659 Nonscarring hair loss, unspecified: Secondary | ICD-10-CM

## 2018-04-01 DIAGNOSIS — Z8041 Family history of malignant neoplasm of ovary: Secondary | ICD-10-CM | POA: Insufficient documentation

## 2018-04-01 DIAGNOSIS — Z9013 Acquired absence of bilateral breasts and nipples: Secondary | ICD-10-CM | POA: Insufficient documentation

## 2018-04-01 DIAGNOSIS — C50911 Malignant neoplasm of unspecified site of right female breast: Secondary | ICD-10-CM

## 2018-04-01 DIAGNOSIS — Z8042 Family history of malignant neoplasm of prostate: Secondary | ICD-10-CM | POA: Insufficient documentation

## 2018-04-01 DIAGNOSIS — Z79899 Other long term (current) drug therapy: Secondary | ICD-10-CM | POA: Insufficient documentation

## 2018-04-01 LAB — COMPREHENSIVE METABOLIC PANEL
ALT: 18 U/L (ref 0–44)
AST: 24 U/L (ref 15–41)
Albumin: 3.9 g/dL (ref 3.5–5.0)
Alkaline Phosphatase: 94 U/L (ref 38–126)
Anion gap: 9 (ref 5–15)
BUN: 11 mg/dL (ref 6–20)
CO2: 29 mmol/L (ref 22–32)
Calcium: 9.8 mg/dL (ref 8.9–10.3)
Chloride: 106 mmol/L (ref 98–111)
Creatinine, Ser: 0.84 mg/dL (ref 0.44–1.00)
GFR calc Af Amer: >60 mL/min (ref >60)
GFR calc non Af Amer: >60 mL/min (ref >60)
Glucose, Bld: 85 mg/dL (ref 70–99)
Potassium: 3.6 mmol/L (ref 3.5–5.1)
Sodium: 144 mmol/L (ref 135–145)
Total Bilirubin: 0.3 mg/dL (ref 0.3–1.2)
Total Protein: 6.7 g/dL (ref 6.5–8.1)

## 2018-04-01 LAB — CBC WITH DIFFERENTIAL/PLATELET
Abs Immature Granulocytes: 0.03 10*3/uL (ref 0.00–0.07)
Basophils Absolute: 0 10*3/uL (ref 0.0–0.1)
Basophils Relative: 1 %
Eosinophils Absolute: 0.2 10*3/uL (ref 0.0–0.5)
Eosinophils Relative: 3 %
HCT: 38.8 % (ref 36.0–46.0)
Hemoglobin: 12.6 g/dL (ref 12.0–15.0)
Immature Granulocytes: 1 %
Lymphocytes Relative: 20 %
Lymphs Abs: 1.3 10*3/uL (ref 0.7–4.0)
MCH: 30.3 pg (ref 26.0–34.0)
MCHC: 32.5 g/dL (ref 30.0–36.0)
MCV: 93.3 fL (ref 80.0–100.0)
Monocytes Absolute: 0.3 10*3/uL (ref 0.1–1.0)
Monocytes Relative: 5 %
Neutro Abs: 4.6 10*3/uL (ref 1.7–7.7)
Neutrophils Relative %: 70 %
Platelets: 239 10*3/uL (ref 150–400)
RBC: 4.16 MIL/uL (ref 3.87–5.11)
RDW: 12.9 % (ref 11.5–15.5)
WBC: 6.5 10*3/uL (ref 4.0–10.5)
nRBC: 0 % (ref 0.0–0.2)

## 2018-04-01 LAB — TSH: TSH: 0.818 u[IU]/mL (ref 0.308–3.960)

## 2018-04-01 LAB — FERRITIN: Ferritin: 16 ng/mL (ref 11–307)

## 2018-04-02 LAB — TESTOSTERONE: Testosterone: <3 ng/dL — ABNORMAL LOW (ref 8–48)

## 2018-04-02 LAB — DHEA-SULFATE: DHEA-SO4: 30.6 ug/dL — ABNORMAL LOW (ref 41.2–243.7)

## 2018-04-08 NOTE — Progress Notes (Signed)
ID: [name]   DOB: June 20, 1970  MR#: [id number]  VVO#:[id number]   PCP: Patient, No Pcp Per GYN: Baltazar Najjar MD SU: Fanny Skates MD OTHER MD: Theodoro Kos, [name], [name]  CHIEF COMPLAINT:  Recurrent Breast Cancer, Right  CURRENT THERAPY: Observation   BREAST CANCER HISTORY:  From the original intake note:  "[name] (goes by "[name]") is a [place] woman who, at the age of 47, was referred by Dr. Baltazar Najjar for treatment of right breast carcinoma.   1.  The patient palpated a mass in her right breast which she brought to Dr. Jacelyn Grip attention. A prior screening mammogram on 02/06/2010 had been unremarkable. The patient was then scheduled for bilateral diagnostic mammogram and right breast ultrasonography on 12/22/2010, the studies demonstrating an ill defined mass with heterogeneous calcifications in the inner lower right breast, measuring 1.7 cm. The breasts are heterogeneously dense, but there were no other mammographic abnormalities in either breast. Clinically, there was a palpable firm mass at the 5:00 position of the right breast, 5 cm from the nipple. There were no palpable abnormalities in the right axilla. Ultrasound confirmed a 1.7 cm irregular, hypoechoic mass in the same position. There were some lymph nodes noted in the right exam of, one measuring 1.6 cm with a minimally thickened cortex inferiorly.  An ultrasound-guided core biopsy on 12/23/2010 [phone]) showed an invasive ductal carcinoma, grade 2, ER positive at 88%, PR positive at 11 is %, HER-2/neu positive with a FISH ratio of 4.61, and an MIB-1 of 36%.  The patient was referred to Dr. Fanny Skates and bilateral breast MRIs were obtained 12/30/2010. This showed the mass in question in the right breast to measure 2.0 cm. In the right axilla there was a 1.8 cm lymph node with a thickened cortex. The left breast was unremarkable.  Her subsequent treatment is as detailed  below.  2.   [name] then had a restaging PET scan late December 2014 which was positive only for a small subcutaneous nodule in the right breast lower outer quadrant. There was no other finding of concern. Ultrasound of both breasts 04/08/2013 showed a nodule in the right breast at the 4:00 position measuring 0.7 cm. The right axilla was clear. There was an area at the 7:00 position of the left breast which was of concern by palpation but showed only normal tissue.   Biopsy of the right breast mass 04/09/2013 showed an invasive ductal carcinoma, grade 2, estrogen receptor 94% positive, progesterone receptor 11% positive, with an MIB-1 of 19% and HER-2 amplified with a signals ratio of 3.75 and the number per cell of 4.50. The patient discussed the situation with with her surgeon's and on 05/05/2013 proceeded to right lumpectomy, with the final pathology (S. is a [id number]) confirming an invasive ductal carcinoma, 0.7 cm, grade 2, within a millimeter of the posterior margin, which showed skeletal muscle. A port was placed at the time of that procedure."  Her subsequent treatment is as detailed below.  INTERVAL HISTORY:  [name] returns today for follow-up of her estrogen receptor positive breast cancer.   The patient continues under observation.   Since her last visit here on 12/19/2017, she underwent a transabdominal and transvaginal ultrasound of the pelvis on 01/01/2018 showing no acute process within the pelvis.   [name]'s last bone density screening on 01/02/2018, showed a T-score of -1.7, which is considered osteopenic.     REVIEW OF SYSTEMS: [name] has some stomach ulcers, she was  prescribed medicine, but has not started it yet. She has had some pain and swelling in her legs. She does not have a PCP, but she usually goes to urgent care if she is feeling unwell. She tries to stay active; she wants to go to a local church that has a track and a workout center inside. The patient denies unusual  headaches, visual changes, nausea, vomiting, or dizziness. There has been no unusual cough, phlegm production, or pleurisy. This been no change in bowel or bladder habits. The patient denies unexplained fatigue or unexplained weight loss, bleeding, rash, or fever. A detailed review of systems was otherwise noncontributory.    PAST MEDICAL HISTORY: Past Medical History:  Diagnosis Date  . Anxiety   . Asthma    as a child  . Breast cancer [facility]) Sept 2012   right breast, inner lower right  . Cervical dysplasia   . Genetic testing 02/01/2018   Genetic testing was performed in 2015. The [employer] OvaNext panel was ordered. In total, 23 genes were analyzed as part of this panel: ATM, BARD1, BRCA1, BRCA2, BRIP1, CDH1, CHEK2, EPCAM, MLH1, MRE11A, MSH2, MSH6, MUTYH, NBN, NF1, PALB2, PMS2, PTEN, RAD50, RAD51C, RAD51D, STK11, and TP53. No pathogenic variants were detected. Genetic testing did detect a Variant of Unknown Significance (VUS) at the t  . GERD (gastroesophageal reflux disease)    had during chemo  . Hx of radiation therapy 11/24/13- 01/08/14   reconstructed right breast/chest wall 4500 cGy 25 sessions, electron beam boost 1440 cGy 8 sessions  . Kidney stones   . Migraines   . Pneumonia   . PONV (postoperative nausea and vomiting)   . Rash    neck  1. History of prior adenoidectomy. 2. History of rhinoplasty.   3. History of multiple uterine cervical treatments for precancerous lesions, none for the past 10 years or so. 4. History of fibroid tumor surgery. 5. History of D and C. 6. History of dysplastic nevi removed by Dr. Ronnald Ramp. 7. History of right "lazy eye" corrective surgery. 8. History of approximately 10-pack-years of tobacco abuse, resolved.  9. History of possible migraines (morning headaches). History of asthma as a child.  PAST SURGICAL HISTORY: Past Surgical History:  Procedure Laterality Date  . ADENOIDECTOMY    . BREAST LUMPECTOMY WITH NEEDLE LOCALIZATION Right 05/05/2013    Procedure: EXCISION RECURRENT CANCER RIGHT BREAST WITH NEEDLE LOCALOZATION;  Surgeon: Adin Hector, MD;  Location: [place];  Service: General;  Laterality: Right;  . BREAST RECONSTRUCTION  06/29/2011   Procedure: BREAST RECONSTRUCTION;  Surgeon: Theodoro Kos, DO;  Location: [place];  Service: Plastics;  Laterality: Bilateral;  Immediate Bilateral Breast Reconstruction with Bilateral Tissue Expanders and placement of  Alloderm   . DILATION AND CURETTAGE OF UTERUS    . fibroid tumor surgery    . lazy eye     corrective surgery  . MASTECTOMY W/ SENTINEL NODE BIOPSY  06/29/2011   Procedure: MASTECTOMY WITH SENTINEL LYMPH NODE BIOPSY;  Surgeon: Adin Hector, MD;  Location: [place];  Service: General;  Laterality: Right;  right skin spairing mstectomy and right sentinel lymph node biopsy  . PORT-A-CATH REMOVAL  03/12/2012   Procedure: MINOR REMOVAL PORT-A-CATH;  Surgeon: Adin Hector, MD;  Location: [place];  Service: General;  Laterality: N/A;  Upper left  . PORTACATH PLACEMENT Left 05/05/2013   Procedure: INSERTION PORT-A-CATH;  Surgeon: Adin Hector, MD;  Location: [employer];  Service: General;  Laterality: Left;  .  RHINOPLASTY    . uterine cervical treatments  2002   for precancerous lesions    FAMILY HISTORY Family History  Adopted: Yes  Problem Relation Age of Onset  . Heart attack Father   . Cancer Cousin 40       melanoma; mat first cousin, located on neck  . Colon cancer Mother 36  . Cancer Maternal Aunt 60       stomach and ovarian as separate primaries  . Cancer Maternal Uncle 65       lung; smoker  . Cancer Maternal Uncle 22       prostate  . Cancer Maternal Grandmother 4       enodometrial  . Cancer Maternal Aunt 72       breast   The patient's biological father died at the age of 17 from myocardial infarction.  The patient's biological mother is alive at age 30  The patient was adopted by her biological mother's parents.  She has 3 half-brothers.   No sisters.  One cousin on her mother's side died from melanoma at the age of 70.  There is other cancer history in the adopted side but these are not even half-brothers or sisters, they are children of her adopted parents who are really her grandparents so these would be uncles and aunts (1 lymph node cancer, 1 prostate cancer, 1 breast cancer at the age of 72). The patient's biological mother was recently diagnosed with colorectal cancer in 2019.    GYNECOLOGIC HISTORY:   (Updated 05/22/2013) She had menarche age 77 or 65.  She is GX, P1, first pregnancy to term at age 56. She stopped having periods approximately March 2013.  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  Her husband, Bren Borys owns a family business called [employer].  They have a son, [name], age 32. Romilda Joy also has a daughter, [name], who lives in [place] and had twins in 2016.  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  The patient is not a church attender.    ADVANCED DIRECTIVES: Not in place   HEALTH MAINTENANCE:  (Updated 06/12/2013) Social History   Tobacco Use  . Smoking status: Former Smoker    Last attempt to quit: 07/20/1996    Years since quitting: 21.7  . Smokeless tobacco: Never Used  Substance Use Topics  . Alcohol use: No    Alcohol/week: 0.0 standard drinks  . Drug use: No     Colonoscopy:  Never  PAP: Oct 2014/[facility]  Bone density: Oct 2014, mild osteopenia with a T score of -1.1  Lipid panel: Not on file  Allergies  Allergen Reactions  . Tussionex Pennkinetic Er [Hydrocod Polst-Cpm Polst Er] Other (See Comments)    hallucinations  . Codeine Other (See Comments)    unkown  . Morphine Rash  . Morphine And Related Anxiety    anxiety  . Penicillin G Rash  .  Penicillins Rash    Current Outpatient Medications  Medication Sig Dispense Refill  . acetaminophen (TYLENOL) 500 MG tablet Take 1 tablet (500 mg total) by mouth every 6 (six) hours as needed. 30 tablet 0  . Biotin 5000 MCG CAPS Take 5,000 mcg by mouth daily.    . calcium citrate-vitamin D (CITRACAL+D) 315-200 MG-UNIT tablet  Take 1 tablet by mouth 2 (two) times daily.    . pantoprazole (PROTONIX) 40 MG tablet Take 1 tablet (40 mg total) by mouth daily. 30 tablet 1  . sucralfate (CARAFATE) 1 g tablet Take 1 tablet (1 g total) by mouth 4 (four) times daily -  with meals and at bedtime. 120 tablet 1   No current facility-administered medications for this visit.     OBJECTIVE: Middle-aged white woman who appears well Vitals:   04/09/18 1506  BP: 97/71  Pulse: 86  Resp: 17  Temp: (!) 97.3 F (36.3 C)  SpO2: 100%     Body mass index is 23.21 kg/m.    ECOG FS: 0 Filed Weights   Sclerae unicteric, pupils round and equal Oropharynx clear and moist No cervical or supraclavicular adenopathy Lungs no rales or rhonchi Heart regular rate and rhythm Abd soft, nontender, positive bowel sounds MSK no focal spinal tenderness, no upper extremity lymphedema Neuro: nonfocal, well oriented, appropriate affect Breasts: Status post bilateral mastectomies with bilateral silicone implant reconstruction.  There is no evidence of local recurrence.  In the right upper lateral aspect of the implant there is an irregularity which is easily palpable and clearly not recurrent disease.  Both axillae are benign.      LAB RESULTS: Lab Results  Component Value Date   WBC 6.5 04/01/2018   NEUTROABS 4.6 04/01/2018   HGB 12.6 04/01/2018   HCT 38.8 04/01/2018   MCV 93.3 04/01/2018   PLT 239 04/01/2018      Chemistry      Component Value Date/Time   NA 144 04/01/2018 1253   NA 142 02/23/2017 1356   K 3.6 04/01/2018 1253   K 4.2 02/23/2017 1356   CL 106 04/01/2018 1253   CL 104 09/23/2012 1415    CO2 29 04/01/2018 1253   CO2 27 02/23/2017 1356   BUN 11 04/01/2018 1253   BUN 11.2 02/23/2017 1356   CREATININE 0.84 04/01/2018 1253   CREATININE 0.9 02/23/2017 1356      Component Value Date/Time   CALCIUM 9.8 04/01/2018 1253   CALCIUM 9.9 02/23/2017 1356   ALKPHOS 94 04/01/2018 1253   ALKPHOS 91 02/23/2017 1356   AST 24 04/01/2018 1253   AST 25 02/23/2017 1356   ALT 18 04/01/2018 1253   ALT 19 02/23/2017 1356   BILITOT 0.3 04/01/2018 1253   BILITOT 0.49 02/23/2017 1356      STUDIES: No results found.   ASSESSMENT: 47 y.o.  BRCA 1-2 negative [name], [place] woman,   (1) status post right breast lower inner quadrant biopsy in September 2012 for a clinically 2.0 cm invasive ductal carcinoma, with some enlarged Right axillary lymph nodes, the largest one of which was negative by biopsy. The tumor was grade 2, estrogen receptor positive at 88%, progesterone receptor positive at 11%, HER-2/neu positive by CISH with a ratio of 4.61, and a proliferation marker of 36%.   (2) treated in the neoadjuvant setting with Q 3 week docetaxel/ carboplatin/ trastuzumab x6, completed 05/18/2011.  Continued on trastuzumab every 3 weeks to complete one year, last dose in November 2013.  (3) s/p bilateral mastectomies with sentinel node biopsy 06/29/2011 for a residual right-sided ypT1c ypN0 invasive ductal carcinoma, grade 2, with immediate expander placement  (4) on tamoxifen as of May 2013, discontinued in early 2015 due to breast cancer recurrence   RECURRENT BREAST CANCER January 2015 (5) status post right lumpectomy with right-sided sentinel lymph node sampling 05/05/2013 for a pT1b  cN0, stage IA invasive ductal carcinoma, estrogen receptor 94% positive, progesterone receptor 11% positive, with an MIB-1 of 19% and HER-2 amplification  (6)  Received TDM-1 every 3 weeks x 8 doses completed 10/09/2013; echo 08/25/2013 showed a well preserved ejection fraction  (7) adjuvant radiation therapy  completed 01/08/2014  (8) started anastrozole 04/10/2014   (a) [place] and estradiol in menopausal range 02/04/2014, to be followed every 3 months  (b) bone density 01/15/2013 showed a T score of -1.1  (c) switched to letrozole 07/07/2014, discontinued June 2016 because of arthralgias/myalgias  (d) started exemestane September 2016, stopped March 2017 because of cost issues  (e) bone density 06/17/2015 showed a T score of -1.5   (f) anastrozole resumed April 2017, discontinued by the patient sometime around April 2018  (9) genetic testing (23 genes associated with hereditary ovarian cancer through [facility], April 2015) 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  There were no deleterious mutations noted   PLAN:  [name] is now just about 5 years out from her local recurrence and is ready to "graduate".  The only flying in the ointment is the fact that she has not established herself with a primary care physician.  Until she does, we will continue to follow her here through our survivorship program.  I have made her an appointment with our survivorship nurse practitioner for October.  Channa requested some labs actually requested by her dermatologist and I gave her those results today.  She will have them interpreted by her dermatologist.  I do note that her ferritin was on the low side and it would not hurt her to take some iron supplementation by mouth  All she will need in terms of breast cancer follow-up is a yearly physician breast exam  I will be glad to see Raissa again at any point in the future on an as-needed basis but as of now are making no further routine appointments for her with me here.  Magrinat, Virgie Dad, MD  04/09/18 3:35 PM Medical Oncology and Hematology [facility] [address] [place], [place] [address] Tel. [phone]    Fax. [phone]   I, Jacqualyn Posey am acting as a Education administrator for Chauncey Cruel, MD.    I, Lurline Del MD, have reviewed the above documentation for accuracy and completeness, and I agree with the above.

## 2018-04-09 ENCOUNTER — Inpatient Hospital Stay (HOSPITAL_BASED_OUTPATIENT_CLINIC_OR_DEPARTMENT_OTHER): Payer: BLUE CROSS/BLUE SHIELD | Admitting: Oncology

## 2018-04-09 ENCOUNTER — Telehealth: Payer: Self-pay | Admitting: Adult Health

## 2018-04-09 VITALS — BP 97/71 | HR 86 | Temp 97.3°F | Resp 17 | Ht 63.0 in

## 2018-04-09 DIAGNOSIS — Z170 Estrogen receptor positive status [ER+]: Secondary | ICD-10-CM

## 2018-04-09 DIAGNOSIS — Z801 Family history of malignant neoplasm of trachea, bronchus and lung: Secondary | ICD-10-CM

## 2018-04-09 DIAGNOSIS — C50311 Malignant neoplasm of lower-inner quadrant of right female breast: Secondary | ICD-10-CM

## 2018-04-09 DIAGNOSIS — Z9221 Personal history of antineoplastic chemotherapy: Secondary | ICD-10-CM

## 2018-04-09 DIAGNOSIS — Z800 Family history of malignant neoplasm of digestive organs: Secondary | ICD-10-CM

## 2018-04-09 DIAGNOSIS — Z8041 Family history of malignant neoplasm of ovary: Secondary | ICD-10-CM

## 2018-04-09 DIAGNOSIS — Z8042 Family history of malignant neoplasm of prostate: Secondary | ICD-10-CM

## 2018-04-09 DIAGNOSIS — Z9013 Acquired absence of bilateral breasts and nipples: Secondary | ICD-10-CM

## 2018-04-09 DIAGNOSIS — Z8049 Family history of malignant neoplasm of other genital organs: Secondary | ICD-10-CM

## 2018-04-09 DIAGNOSIS — Z87891 Personal history of nicotine dependence: Secondary | ICD-10-CM

## 2018-04-09 DIAGNOSIS — Z79811 Long term (current) use of aromatase inhibitors: Secondary | ICD-10-CM | POA: Diagnosis not present

## 2018-04-09 DIAGNOSIS — C50911 Malignant neoplasm of unspecified site of right female breast: Secondary | ICD-10-CM

## 2018-04-09 DIAGNOSIS — Z79899 Other long term (current) drug therapy: Secondary | ICD-10-CM

## 2018-04-09 DIAGNOSIS — Z808 Family history of malignant neoplasm of other organs or systems: Secondary | ICD-10-CM

## 2018-04-09 DIAGNOSIS — Z803 Family history of malignant neoplasm of breast: Secondary | ICD-10-CM

## 2018-04-09 DIAGNOSIS — Z923 Personal history of irradiation: Secondary | ICD-10-CM

## 2018-04-09 DIAGNOSIS — Z421 Encounter for breast reconstruction following mastectomy: Secondary | ICD-10-CM

## 2018-04-09 NOTE — Telephone Encounter (Signed)
Gave avs and calendar ° °

## 2018-04-12 ENCOUNTER — Telehealth: Payer: Self-pay

## 2018-04-12 NOTE — Telephone Encounter (Signed)
Pt called to get her lab results to give to her dermatologist. Pt also requested to have her MD note and labwork mailed at home. Will send pt requested papers in the mail.

## 2018-04-17 ENCOUNTER — Telehealth: Payer: Self-pay

## 2018-04-17 NOTE — Telephone Encounter (Signed)
Returned patient's call.  Nurse confirmed that labs/notes were mailed out on 04/12/2018.  Pt request that labs be sent to Dr. Ubaldo Glassing at fax number [phone].  Nurse sent labs.  No further needs at this time.

## 2018-04-29 ENCOUNTER — Other Ambulatory Visit: Payer: BLUE CROSS/BLUE SHIELD

## 2018-05-06 ENCOUNTER — Ambulatory Visit: Payer: BLUE CROSS/BLUE SHIELD | Admitting: Adult Health

## 2018-05-17 ENCOUNTER — Ambulatory Visit: Payer: BLUE CROSS/BLUE SHIELD | Admitting: Obstetrics

## 2018-05-17 ENCOUNTER — Encounter: Payer: Self-pay | Admitting: Obstetrics

## 2018-05-17 VITALS — BP 109/77 | HR 71 | Ht 63.0 in

## 2018-05-17 DIAGNOSIS — Z113 Encounter for screening for infections with a predominantly sexual mode of transmission: Secondary | ICD-10-CM

## 2018-05-17 DIAGNOSIS — N762 Acute vulvitis: Secondary | ICD-10-CM | POA: Diagnosis not present

## 2018-05-17 DIAGNOSIS — N898 Other specified noninflammatory disorders of vagina: Secondary | ICD-10-CM | POA: Diagnosis not present

## 2018-05-17 MED ORDER — CLOBETASOL PROPIONATE 0.05 % EX CREA: 1.0000 "application " | TOPICAL_CREAM | Freq: Two times a day (BID) | CUTANEOUS | 0 refills | Status: DC

## 2018-05-17 NOTE — Addendum Note (Signed)
Addended by: Maryruth Eve on: 05/17/2018 11:28 AM   Modules accepted: Orders

## 2018-05-17 NOTE — Progress Notes (Signed)
GYN problem visit c/o vaginal burning, irritation, and bumps since Dec.  Normal pap 12/31/17 Mammogram UTD Pt declined to weigh this morning.

## 2018-05-17 NOTE — Progress Notes (Signed)
Patient ID: [name], female   DOB: May 10, 1970, 48 y.o.   MRN: [id number]  Chief Complaint  Patient presents with  . Vaginitis    HPI [name] is a 48 y.o. female.  Itching and irritation of vulva and vagina.  Denies vaginal discharge. HPI  Past Medical History:  Diagnosis Date  . Anxiety   . Asthma    as a child  . Breast cancer [facility]) Sept 2012   right breast, inner lower right  . Cervical dysplasia   . Genetic testing 02/01/2018   Genetic testing was performed in 2015. The [employer] OvaNext panel was ordered. In total, 23 genes were analyzed as part of this panel: ATM, BARD1, BRCA1, BRCA2, BRIP1, CDH1, CHEK2, EPCAM, MLH1, MRE11A, MSH2, MSH6, MUTYH, NBN, NF1, PALB2, PMS2, PTEN, RAD50, RAD51C, RAD51D, STK11, and TP53. No pathogenic variants were detected. Genetic testing did detect a Variant of Unknown Significance (VUS) at the t  . GERD (gastroesophageal reflux disease)    had during chemo  . Hx of radiation therapy 11/24/13- 01/08/14   reconstructed right breast/chest wall 4500 cGy 25 sessions, electron beam boost 1440 cGy 8 sessions  . Kidney stones   . Migraines   . Pneumonia   . PONV (postoperative nausea and vomiting)   . Rash    neck    Past Surgical History:  Procedure Laterality Date  . ADENOIDECTOMY    . BREAST LUMPECTOMY WITH NEEDLE LOCALIZATION Right 05/05/2013   Procedure: EXCISION RECURRENT CANCER RIGHT BREAST WITH NEEDLE LOCALOZATION;  Surgeon: Adin Hector, MD;  Location: [place];  Service: General;  Laterality: Right;  . BREAST RECONSTRUCTION  06/29/2011   Procedure: BREAST RECONSTRUCTION;  Surgeon: Theodoro Kos, DO;  Location: [place];  Service: Plastics;  Laterality: Bilateral;  Immediate Bilateral Breast Reconstruction with Bilateral Tissue Expanders and placement of  Alloderm   . DILATION AND CURETTAGE OF UTERUS    . fibroid tumor surgery    . lazy eye     corrective surgery  . MASTECTOMY W/ SENTINEL NODE BIOPSY  06/29/2011   Procedure:  MASTECTOMY WITH SENTINEL LYMPH NODE BIOPSY;  Surgeon: Adin Hector, MD;  Location: [place];  Service: General;  Laterality: Right;  right skin spairing mstectomy and right sentinel lymph node biopsy  . PORT-A-CATH REMOVAL  03/12/2012   Procedure: MINOR REMOVAL PORT-A-CATH;  Surgeon: Adin Hector, MD;  Location: [place];  Service: General;  Laterality: N/A;  Upper left  . PORTACATH PLACEMENT Left 05/05/2013   Procedure: INSERTION PORT-A-CATH;  Surgeon: Adin Hector, MD;  Location: [place];  Service: General;  Laterality: Left;  . RHINOPLASTY    . uterine cervical treatments  2002   for precancerous lesions    Family History  Adopted: Yes  Problem Relation Age of Onset  . Heart attack Father   . Cancer Cousin 40       melanoma; mat first cousin, located on neck  . Colon cancer Mother 62  . Cancer Maternal Aunt 60       stomach and ovarian as separate primaries  . Cancer Maternal Uncle 65       lung; smoker  . Cancer Maternal Uncle 37       prostate  . Cancer Maternal Grandmother 18       enodometrial  . Cancer Maternal Aunt 50       breast    Social History Social History   Tobacco Use  . Smoking status: Former Smoker  Last attempt to quit: 07/20/1996    Years since quitting: 21.8  . Smokeless tobacco: Never Used  Substance Use Topics  . Alcohol use: No    Alcohol/week: 0.0 standard drinks  . Drug use: No    Allergies  Allergen Reactions  . Tussionex Pennkinetic Er [Hydrocod Polst-Cpm Polst Er] Other (See Comments)    hallucinations  . Codeine Other (See Comments)    unkown  . Morphine Rash  . Morphine And Related Anxiety    anxiety  . Penicillin G Rash  . Penicillins Rash    Current Outpatient Medications  Medication Sig Dispense Refill  . acetaminophen (TYLENOL) 500 MG tablet Take 1 tablet (500 mg total) by mouth every 6 (six) hours as needed. 30 tablet 0  . Biotin 5000 MCG CAPS Take 5,000 mcg by mouth daily.    . calcium  citrate-vitamin D (CITRACAL+D) 315-200 MG-UNIT tablet Take 1 tablet by mouth 2 (two) times daily.    . clobetasol cream (TEMOVATE) 0.30 % Apply 1 application topically 2 (two) times daily. 45 g 0  . pantoprazole (PROTONIX) 40 MG tablet Take 1 tablet (40 mg total) by mouth daily. (Patient not taking: Reported on 05/17/2018) 30 tablet 1  . sucralfate (CARAFATE) 1 g tablet Take 1 tablet (1 g total) by mouth 4 (four) times daily -  with meals and at bedtime. (Patient not taking: Reported on 05/17/2018) 120 tablet 1   No current facility-administered medications for this visit.     Review of Systems Review of Systems Constitutional: negative for fatigue and weight loss Respiratory: negative for cough and wheezing Cardiovascular: negative for chest pain, fatigue and palpitations Gastrointestinal: negative for abdominal pain and change in bowel habits Genitourinary:POSITIVE for itching and irritation of vulva and vagina Integument/breast: negative for nipple discharge Musculoskeletal:negative for myalgias Neurological: negative for gait problems and tremors Behavioral/Psych: negative for abusive relationship, depression Endocrine: negative for temperature intolerance      Blood pressure 109/77, pulse 71, height 5' 3"  (1.6 m), last menstrual period 03/11/2011.  Physical Exam Physical Exam           General:  Alert and no distress Abdomen:  normal findings: no organomegaly, soft, non-tender and no hernia  Pelvis:  External genitalia: normal general appearance Urinary system: urethral meatus normal and bladder without fullness, nontender Vaginal: normal without tenderness, induration or masses Cervix: normal appearance Adnexa: normal bimanual exam Uterus: anteverted and non-tender, normal size    50% of 15 min visit spent on counseling and coordination of care.   Data Reviewed Wet Prep  Assessment     1. Acute vulvitis Rx: - clobetasol cream (TEMOVATE) 0.05 %; Apply 1 application  topically 2 (two) times daily.  Dispense: 45 g; Refill: 0  2. Vaginal irritation Rx: - Cervicovaginal ancillary only( [place])    Plan    Follow up prn  No orders of the defined types were placed in this encounter.  Meds ordered this encounter  Medications  . clobetasol cream (TEMOVATE) 0.05 %    Sig: Apply 1 application topically 2 (two) times daily.    Dispense:  45 g    Refill:  0     Shelly Bombard MD 05-17-2018

## 2018-05-19 LAB — CERVICOVAGINAL ANCILLARY ONLY
Bacterial vaginitis: NEGATIVE
Candida vaginitis: NEGATIVE
Trichomonas: NEGATIVE

## 2018-05-19 LAB — URINE CULTURE

## 2018-05-21 ENCOUNTER — Other Ambulatory Visit: Payer: Self-pay | Admitting: Obstetrics

## 2018-05-21 DIAGNOSIS — L309 Dermatitis, unspecified: Secondary | ICD-10-CM

## 2018-05-21 MED ORDER — TRIAMCINOLONE ACETONIDE 0.1 % EX OINT: 1.0000 "application " | TOPICAL_OINTMENT | Freq: Two times a day (BID) | CUTANEOUS | 1 refills | Status: DC

## 2018-07-02 ENCOUNTER — Other Ambulatory Visit: Payer: Self-pay | Admitting: Obstetrics

## 2018-07-02 ENCOUNTER — Other Ambulatory Visit: Payer: Self-pay | Admitting: Oncology

## 2018-07-02 DIAGNOSIS — M62838 Other muscle spasm: Secondary | ICD-10-CM

## 2018-11-06 ENCOUNTER — Other Ambulatory Visit: Payer: Self-pay

## 2018-11-06 MED ORDER — VITAFOL FE+ 90-0.6-0.4-200 MG PO CAPS: 1.0000 | ORAL_CAPSULE | Freq: Every day | ORAL | 11 refills | Status: DC

## 2019-02-06 ENCOUNTER — Encounter: Payer: BLUE CROSS/BLUE SHIELD | Admitting: Adult Health

## 2019-07-24 ENCOUNTER — Telehealth: Payer: Self-pay

## 2019-07-24 NOTE — Telephone Encounter (Signed)
RN called patient per schedulers request regarding questions.   Voicemail left for return call.

## 2019-07-25 ENCOUNTER — Telehealth: Payer: Self-pay

## 2019-07-25 NOTE — Telephone Encounter (Signed)
RN spoke with patient, patient reports she was suppose to have a scan prior to survivorship appointment with Wilber Bihari, NP.     RN reviewed previous MD note, no recommendations for scan at that time.    Pt reports she has breast implants and re-called MD needing imaging.  Pt also reports tenderness to right side of rib cage.  RN notified patient we would review with MD for scan recommendations.

## 2019-07-28 ENCOUNTER — Other Ambulatory Visit: Payer: Self-pay

## 2019-07-28 ENCOUNTER — Other Ambulatory Visit: Payer: Self-pay | Admitting: Oncology

## 2019-07-28 DIAGNOSIS — C50911 Malignant neoplasm of unspecified site of right female breast: Secondary | ICD-10-CM

## 2019-08-05 ENCOUNTER — Telehealth: Payer: Self-pay | Admitting: *Deleted

## 2019-08-05 NOTE — Telephone Encounter (Signed)
Received vm from pt to call her regarding scan ordered.  Returned call & she states that she is having some pain/pressure, feeling of expanding L side rib cage around to back.  She wants to make sure that CT ordered will cover this area.  Informed that it should but to discuss with radiology tech & if they feel that something else needs to be added they will call Dr Jana Hakim.

## 2019-08-05 NOTE — Telephone Encounter (Signed)
Received ph call [facility] stating auth approved for CPT 71260 good for 4/26?21-7/25/21 with reference # [id number].  Message sent to [employer].

## 2019-08-11 ENCOUNTER — Inpatient Hospital Stay: Payer: 59 | Attending: Oncology

## 2019-08-11 ENCOUNTER — Ambulatory Visit (HOSPITAL_COMMUNITY)
Admission: RE | Admit: 2019-08-11 | Discharge: 2019-08-11 | Disposition: A | Discharge status: Home / Self Care | Payer: 59 | Source: Ambulatory Visit | Attending: Adult Health | Admitting: Adult Health

## 2019-08-11 ENCOUNTER — Encounter (HOSPITAL_COMMUNITY): Payer: Self-pay

## 2019-08-11 ENCOUNTER — Other Ambulatory Visit: Payer: Self-pay

## 2019-08-11 DIAGNOSIS — R42 Dizziness and giddiness: Secondary | ICD-10-CM | POA: Insufficient documentation

## 2019-08-11 DIAGNOSIS — C50911 Malignant neoplasm of unspecified site of right female breast: Secondary | ICD-10-CM | POA: Insufficient documentation

## 2019-08-11 DIAGNOSIS — M545 Low back pain: Secondary | ICD-10-CM | POA: Insufficient documentation

## 2019-08-11 DIAGNOSIS — R519 Headache, unspecified: Secondary | ICD-10-CM | POA: Insufficient documentation

## 2019-08-11 DIAGNOSIS — Z853 Personal history of malignant neoplasm of breast: Secondary | ICD-10-CM | POA: Insufficient documentation

## 2019-08-11 DIAGNOSIS — R6884 Jaw pain: Secondary | ICD-10-CM | POA: Insufficient documentation

## 2019-08-11 DIAGNOSIS — K5900 Constipation, unspecified: Secondary | ICD-10-CM | POA: Insufficient documentation

## 2019-08-11 LAB — CMP (CANCER CENTER ONLY)
ALT: 21 U/L (ref 0–44)
AST: 23 U/L (ref 15–41)
Albumin: 4.2 g/dL (ref 3.5–5.0)
Alkaline Phosphatase: 101 U/L (ref 38–126)
Anion gap: 10 (ref 5–15)
BUN: 13 mg/dL (ref 6–20)
CO2: 28 mmol/L (ref 22–32)
Calcium: 9.5 mg/dL (ref 8.9–10.3)
Chloride: 107 mmol/L (ref 98–111)
Creatinine: 0.87 mg/dL (ref 0.44–1.00)
GFR, Est AFR Am: >60 mL/min (ref >60)
GFR, Est Non Af Am: >60 mL/min (ref >60)
Glucose, Bld: 90 mg/dL (ref 70–99)
Potassium: 4.2 mmol/L (ref 3.5–5.1)
Sodium: 145 mmol/L (ref 135–145)
Total Bilirubin: 0.6 mg/dL (ref 0.3–1.2)
Total Protein: 6.9 g/dL (ref 6.5–8.1)

## 2019-08-11 LAB — CBC WITH DIFFERENTIAL (CANCER CENTER ONLY)
Abs Immature Granulocytes: 0.02 10*3/uL (ref 0.00–0.07)
Basophils Absolute: 0 10*3/uL (ref 0.0–0.1)
Basophils Relative: 1 %
Eosinophils Absolute: 0.1 10*3/uL (ref 0.0–0.5)
Eosinophils Relative: 1 %
HCT: 40.3 % (ref 36.0–46.0)
Hemoglobin: 13.2 g/dL (ref 12.0–15.0)
Immature Granulocytes: 0 %
Lymphocytes Relative: 22 %
Lymphs Abs: 1.5 10*3/uL (ref 0.7–4.0)
MCH: 30 pg (ref 26.0–34.0)
MCHC: 32.8 g/dL (ref 30.0–36.0)
MCV: 91.6 fL (ref 80.0–100.0)
Monocytes Absolute: 0.4 10*3/uL (ref 0.1–1.0)
Monocytes Relative: 5 %
Neutro Abs: 4.7 10*3/uL (ref 1.7–7.7)
Neutrophils Relative %: 71 %
Platelet Count: 264 10*3/uL (ref 150–400)
RBC: 4.4 MIL/uL (ref 3.87–5.11)
RDW: 12.5 % (ref 11.5–15.5)
WBC Count: 6.7 10*3/uL (ref 4.0–10.5)
nRBC: 0 % (ref 0.0–0.2)

## 2019-08-11 LAB — TSH: TSH: 0.952 u[IU]/mL (ref 0.308–3.960)

## 2019-08-11 LAB — FERRITIN: Ferritin: 42 ng/mL (ref 11–307)

## 2019-08-11 MED ORDER — SODIUM CHLORIDE (PF) 0.9 % IJ SOLN
INTRAMUSCULAR | Status: AC
  Filled 2019-08-11: qty 50

## 2019-08-11 MED ORDER — IOHEXOL 300 MG/ML  SOLN
75.0000 mL | Freq: Once | INTRAMUSCULAR | Status: AC | PRN
  Administered 2019-08-11: 75 mL via INTRAVENOUS

## 2019-08-12 ENCOUNTER — Other Ambulatory Visit: Payer: Self-pay | Admitting: Adult Health

## 2019-08-12 DIAGNOSIS — K769 Liver disease, unspecified: Secondary | ICD-10-CM

## 2019-08-12 LAB — TESTOSTERONE: Testosterone: <3 ng/dL — ABNORMAL LOW (ref 8–48)

## 2019-08-12 NOTE — Progress Notes (Signed)
Called patient and reviewed her CT scan results with her.  There were three lesions on the liver and we need to do an MRI to evaluate better.  I placed orders for MRI abdomen with and without contrast as recommended by reading radiologist.  I reviewed this with Gana, and she notes she has no metal implants, allergy to MRI contrast, or other contraindications to MRI.  We will get this scheduled and she will call to delay her appt with me to after 5/14 if needed so she can undergo the MRI abdomen before.  S  I reviewed her labs with her briefly.  We can discuss further at her upcoming appointment.  Wilber Bihari, NP

## 2019-08-21 ENCOUNTER — Encounter: Payer: BLUE CROSS/BLUE SHIELD | Admitting: Adult Health

## 2019-08-21 ENCOUNTER — Other Ambulatory Visit: Payer: BLUE CROSS/BLUE SHIELD

## 2019-08-25 ENCOUNTER — Ambulatory Visit (HOSPITAL_COMMUNITY): Payer: 59

## 2019-08-26 ENCOUNTER — Ambulatory Visit (HOSPITAL_COMMUNITY)
Admission: RE | Admit: 2019-08-26 | Discharge: 2019-08-26 | Disposition: A | Discharge status: Home / Self Care | Payer: 59 | Source: Ambulatory Visit | Attending: Adult Health | Admitting: Adult Health

## 2019-08-26 ENCOUNTER — Other Ambulatory Visit: Payer: Self-pay

## 2019-08-26 DIAGNOSIS — K769 Liver disease, unspecified: Secondary | ICD-10-CM | POA: Diagnosis not present

## 2019-08-26 MED ORDER — GADOBUTROL 1 MMOL/ML IV SOLN
7.0000 mL | Freq: Once | INTRAVENOUS | Status: AC | PRN
  Administered 2019-08-26: 7 mL via INTRAVENOUS

## 2019-08-28 ENCOUNTER — Inpatient Hospital Stay (HOSPITAL_BASED_OUTPATIENT_CLINIC_OR_DEPARTMENT_OTHER): Payer: 59 | Admitting: Adult Health

## 2019-08-28 ENCOUNTER — Other Ambulatory Visit: Payer: Self-pay

## 2019-08-28 VITALS — BP 100/62 | HR 61 | Temp 98.5°F | Resp 18 | Ht 63.0 in | Wt 128.1 lb

## 2019-08-28 DIAGNOSIS — C50311 Malignant neoplasm of lower-inner quadrant of right female breast: Secondary | ICD-10-CM | POA: Diagnosis not present

## 2019-08-28 DIAGNOSIS — G44221 Chronic tension-type headache, intractable: Secondary | ICD-10-CM

## 2019-08-28 DIAGNOSIS — R519 Headache, unspecified: Secondary | ICD-10-CM | POA: Diagnosis not present

## 2019-08-28 DIAGNOSIS — M545 Low back pain: Secondary | ICD-10-CM | POA: Diagnosis not present

## 2019-08-28 DIAGNOSIS — R42 Dizziness and giddiness: Secondary | ICD-10-CM | POA: Diagnosis not present

## 2019-08-28 DIAGNOSIS — Z853 Personal history of malignant neoplasm of breast: Secondary | ICD-10-CM | POA: Diagnosis not present

## 2019-08-28 DIAGNOSIS — Z170 Estrogen receptor positive status [ER+]: Secondary | ICD-10-CM | POA: Diagnosis not present

## 2019-08-28 DIAGNOSIS — K5900 Constipation, unspecified: Secondary | ICD-10-CM | POA: Diagnosis not present

## 2019-08-28 DIAGNOSIS — R6884 Jaw pain: Secondary | ICD-10-CM | POA: Diagnosis not present

## 2019-08-28 NOTE — Progress Notes (Signed)
CLINIC:  Survivorship   REASON FOR VISIT:  Routine follow-up for history of breast cancer.   BRIEF ONCOLOGIC HISTORY:   49 y.o.  BRCA 1-2 negative [name], [place] woman,   (1) status post right breast lower inner quadrant biopsy in September 2012 for a clinically 2.0 cm invasive ductal carcinoma, with some enlarged Right axillary lymph nodes, the largest one of which was negative by biopsy. The tumor was grade 2, estrogen receptor positive at 88%, progesterone receptor positive at 11%, HER-2/neu positive by CISH with a ratio of 4.61, and a proliferation marker of 36%.   (2) treated in the neoadjuvant setting with Q 3 week docetaxel/ carboplatin/ trastuzumab x6, completed 05/18/2011.  Continued on trastuzumab every 3 weeks to complete one year, last dose in November 2013.  (3) s/p bilateral mastectomies with sentinel node biopsy 06/29/2011 for a residual right-sided ypT1c ypN0 invasive ductal carcinoma, grade 2, with immediate expander placement  (4) on tamoxifen as of May 2013, discontinued in early 2015 due to breast cancer recurrence   RECURRENT BREAST CANCER January 2015 (5) status post right lumpectomy with right-sided sentinel lymph node sampling 05/05/2013 for a pT1b cN0, stage IA invasive ductal carcinoma, estrogen receptor 94% positive, progesterone receptor 11% positive, with an MIB-1 of 19% and HER-2 amplification  (6)  Received TDM-1 every 3 weeks x 8 doses completed 10/09/2013; echo 08/25/2013 showed a well preserved ejection fraction  (7) adjuvant radiation therapy completed 01/08/2014  (8) started anastrozole 04/10/2014              (a) [place] and estradiol in menopausal range 02/04/2014, to be followed every 3 months             (b) bone density 01/15/2013 showed a T score of -1.1             (c) switched to letrozole 07/07/2014, discontinued June 2016 because of arthralgias/myalgias             (d) started exemestane September 2016, stopped March 2017 because of  cost issues             (e) bone density 06/17/2015 showed a T score of -1.5              (f) anastrozole resumed April 2017, discontinued by the patient sometime around April 2018  (9) genetic testing (23 genes associated with hereditary ovarian cancer through [employer], April 2015) 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  There were no deleterious mutations noted   INTERVAL HISTORY:  [name] presents to the [facility] today for routine follow-up for her history of breast cancer.  Overall, [name] reports feeling quite well. Siomara brought forward the following concerns regarding how [name] is feeling today.  [name] also brought several supplements for me to review as to what [name] should and shouldn't take.     [place] is having some fatigue, a washed out feeling, and some tiredness.  [name] has noted her mood is more apathetic, [name] underwent CT chest that demonstrated three hypervascular liver abnormalities and MRI abdomen was recommended and [name] completed on this on 08/26/2019.  They only saw a small faint liver lesion, and two of the three were not evident.  [name] is wondering how to interpret these and what to do moving forward.  [name] is also constipated.  [name] notes that [name] is struggling with constipation.  [name] has undergone upper GI and colonoscopy.  [name] did this in 2018.  Lower back pain, jaw pain, headache that starts posteriorly and is accompanied by dizziness.  This happens daily and [name] is taking excedrine for this.    REVIEW OF SYSTEMS:  Review of Systems  Constitutional: Positive for fatigue. Negative for appetite change, chills, fever and unexpected weight change.  HENT:   Negative for hearing loss and lump/mass.   Eyes: Negative for eye problems and icterus.  Respiratory: Negative for chest tightness and cough.   Cardiovascular: Negative for chest pain, leg swelling and palpitations.  Gastrointestinal: Positive for constipation. Negative  for abdominal distention, abdominal pain, diarrhea, nausea and vomiting.  Endocrine: Negative for hot flashes.  Musculoskeletal: Positive for back pain.  Skin: Negative for itching and rash.  Neurological: Positive for dizziness and headaches. Negative for extremity weakness, numbness and speech difficulty.  Hematological: Negative for adenopathy. Does not bruise/bleed easily.  Psychiatric/Behavioral: Negative for depression. The patient is nervous/anxious.    Breast: Denies any new nodularity, masses, tenderness, nipple changes, or nipple discharge.       PAST MEDICAL/SURGICAL HISTORY:  Past Medical History:  Diagnosis Date  . Anxiety   . Asthma    as a child  . Breast cancer [facility]) Sept 2012   right breast, inner lower right  . Cervical dysplasia   . Genetic testing 02/01/2018   Genetic testing was performed in 2015. The [facility] panel was ordered. In total, 23 genes were analyzed as part of this panel: ATM, BARD1, BRCA1, BRCA2, BRIP1, CDH1, CHEK2, EPCAM, MLH1, MRE11A, MSH2, MSH6, MUTYH, NBN, NF1, PALB2, PMS2, PTEN, RAD50, RAD51C, RAD51D, STK11, and TP53. No pathogenic variants were detected. Genetic testing did detect a Variant of Unknown Significance (VUS) at the t  . GERD (gastroesophageal reflux disease)    had during chemo  . Hx of radiation therapy 11/24/13- 01/08/14   reconstructed right breast/chest wall 4500 cGy 25 sessions, electron beam boost 1440 cGy 8 sessions  . Kidney stones   . Migraines   . Pneumonia   . PONV (postoperative nausea and vomiting)   . Rash    neck   Past Surgical History:  Procedure Laterality Date  . ADENOIDECTOMY    . BREAST LUMPECTOMY WITH NEEDLE LOCALIZATION Right 05/05/2013   Procedure: EXCISION RECURRENT CANCER RIGHT BREAST WITH NEEDLE LOCALOZATION;  Surgeon: Adin Hector, MD;  Location: [place];  Service: General;  Laterality: Right;  . BREAST RECONSTRUCTION  06/29/2011   Procedure: BREAST RECONSTRUCTION;  Surgeon: Theodoro Kos, DO;   Location: [place];  Service: Plastics;  Laterality: Bilateral;  Immediate Bilateral Breast Reconstruction with Bilateral Tissue Expanders and placement of  Alloderm   . DILATION AND CURETTAGE OF UTERUS    . fibroid tumor surgery    . lazy eye     corrective surgery  . MASTECTOMY W/ SENTINEL NODE BIOPSY  06/29/2011   Procedure: MASTECTOMY WITH SENTINEL LYMPH NODE BIOPSY;  Surgeon: Adin Hector, MD;  Location: [place];  Service: General;  Laterality: Right;  right skin spairing mstectomy and right sentinel lymph node biopsy  . PORT-A-CATH REMOVAL  03/12/2012   Procedure: MINOR REMOVAL PORT-A-CATH;  Surgeon: Adin Hector, MD;  Location: [place];  Service: General;  Laterality: N/A;  Upper left  . PORTACATH PLACEMENT Left 05/05/2013   Procedure: INSERTION PORT-A-CATH;  Surgeon: Adin Hector, MD;  Location: [place];  Service: General;  Laterality: Left;  . RHINOPLASTY    . uterine cervical treatments  2002   for precancerous lesions  ALLERGIES:  Allergies  Allergen Reactions  . Tussionex Pennkinetic Er [Hydrocod Polst-Cpm Polst Er] Other (See Comments)    hallucinations  . Codeine Other (See Comments)    unkown  . Morphine Rash  . Morphine And Related Anxiety    anxiety  . Penicillin G Rash  . Penicillins Rash     CURRENT MEDICATIONS:  Outpatient Encounter Medications as of 08/28/2019  Medication Sig  . aspirin-acetaminophen-caffeine (EXCEDRIN MIGRAINE) 250-250-65 MG tablet Take by mouth every 6 (six) hours as needed for headache.  . Biotin 5000 MCG CAPS Take 5,000 mcg by mouth daily.  . Prenat-Fe Poly-Methfol-FA-DHA (VITAFOL FE+) 90-0.6-0.4-200 MG CAPS Take 1 tablet by mouth daily.  . Vitamin D, Ergocalciferol, (DRISDOL) 1.25 MG (50000 UNIT) CAPS capsule Take 50,000 Units by mouth every 7 (seven) days.  . [DISCONTINUED] acetaminophen (TYLENOL) 500 MG tablet Take 1 tablet (500 mg total) by mouth every 6 (six) hours as needed.  . [DISCONTINUED] calcium  citrate-vitamin D (CITRACAL+D) 315-200 MG-UNIT tablet Take 1 tablet by mouth 2 (two) times daily.  . [DISCONTINUED] escitalopram (LEXAPRO) 20 MG tablet Take 1 tablet by mouth once daily (Patient not taking: Reported on 08/28/2019)  . [DISCONTINUED] ibuprofen (ADVIL,MOTRIN) 800 MG tablet TAKE 1 TABLET BY MOUTH EVERY 8 HOURS AS NEEDED (Patient not taking: Reported on 08/28/2019)  . [DISCONTINUED] pantoprazole (PROTONIX) 40 MG tablet Take 1 tablet (40 mg total) by mouth daily. (Patient not taking: Reported on 08/28/2019)  . [DISCONTINUED] sucralfate (CARAFATE) 1 g tablet Take 1 tablet (1 g total) by mouth 4 (four) times daily -  with meals and at bedtime. (Patient not taking: Reported on 08/28/2019)  . [DISCONTINUED] triamcinolone ointment (KENALOG) 0.1 % Apply 1 application topically 2 (two) times daily. (Patient not taking: Reported on 08/28/2019)   No facility-administered encounter medications on file as of 08/28/2019.     ONCOLOGIC FAMILY HISTORY:  Family History  Adopted: Yes  Problem Relation Age of Onset  . Heart attack Father   . Cancer Cousin 40       melanoma; mat first cousin, located on neck  . Colon cancer Mother 68  . Cancer Maternal Aunt 60       stomach and ovarian as separate primaries  . Cancer Maternal Uncle 65       lung; smoker  . Cancer Maternal Uncle 66       prostate  . Cancer Maternal Grandmother 75       enodometrial  . Cancer Maternal Aunt 4       breast    GENETIC COUNSELING/TESTING:negative  SOCIAL HISTORY:  Social History   Socioeconomic History  . Marital status: Married    Spouse name: Not on file  . Number of children: Not on file  . Years of education: Not on file  . Highest education level: Not on file  Occupational History  . Not on file  Tobacco Use  . Smoking status: Former Smoker    Quit date: 07/20/1996    Years since quitting: 23.1  . Smokeless tobacco: Never Used  Substance and Sexual Activity  . Alcohol use: No    Alcohol/week: 0.0  standard drinks  . Drug use: No  . Sexual activity: Yes    Partners: Male  Other Topics Concern  . Not on file  Social History Narrative   [name] was adopted by her grandparents as a child.  [name] is married with a young son and an adult Psychiatrist.  [name] works in a Engineer, agricultural.  Social Determinants of Health   Financial Resource Strain:   . Difficulty of Paying Living Expenses:   Food Insecurity:   . Worried About Charity fundraiser in the Last Year:   . Arboriculturist in the Last Year:   Transportation Needs:   . Film/video editor (Medical):   [name] Lack of Transportation (Non-Medical):   Physical Activity:   . Days of Exercise per Week:   . Minutes of Exercise per Session:   Stress:   . Feeling of Stress :   Social Connections:   . Frequency of Communication with Friends and Family:   . Frequency of Social Gatherings with Friends and Family:   . Attends Religious Services:   . Active Member of Clubs or Organizations:   . Attends Archivist Meetings:   [name] Marital Status:   Intimate Partner Violence:   . Fear of Current or Ex-Partner:   . Emotionally Abused:   [name] Physically Abused:   . Sexually Abused:       PHYSICAL EXAMINATION:  Vital Signs: Vitals:   08/28/19 1043  BP: 100/62  Pulse: 61  Resp: 18  Temp: 98.5 F (36.9 C)  SpO2: 100%   Filed Weights   08/28/19 1043  Weight: 128 lb 1.6 oz (58.1 kg)   General: Well-nourished, well-appearing female in no acute distress.  Unaccompanied today.   HEENT: Head is normocephalic.  Pupils equal and reactive to light. Conjunctivae clear without exudate.  Sclerae anicteric. Oral mucosa is pink, moist.  Oropharynx is pink without lesions or erythema.  Lymph: No cervical, supraclavicular, or infraclavicular lymphadenopathy noted on palpation.  Cardiovascular: Regular rate and rhythm.[name] Respiratory: Clear to auscultation bilaterally. Chest expansion symmetric; breathing non-labored.  Breast Exam:    S/p bilateral mastectomies, no sign of local recurrence -Axilla: No axillary adenopathy bilaterally.  GI: Abdomen soft and round; non-tender, non-distended. Bowel sounds normoactive. No hepatosplenomegaly.   GU: Deferred.  Neuro: No focal deficits. Steady gait.  Psych: Mood and affect normal and appropriate for situation.  MSK: No focal spinal tenderness to palpation, full range of motion in bilateral upper extremities Extremities: No edema. Skin: Warm and dry.  LABORATORY DATA:  No visits with results within 1 Day(s) from this visit.  Latest known visit with results is:  Appointment on 08/11/2019  Component Date Value Ref Range Status  . WBC Count 08/11/2019 6.7  4.0 - 10.5 K/uL Final  . RBC 08/11/2019 4.40  3.87 - 5.11 MIL/uL Final  . Hemoglobin 08/11/2019 13.2  12.0 - 15.0 g/dL Final  . HCT 08/11/2019 40.3  36.0 - 46.0 % Final  . MCV 08/11/2019 91.6  80.0 - 100.0 fL Final  . MCH 08/11/2019 30.0  26.0 - 34.0 pg Final  . MCHC 08/11/2019 32.8  30.0 - 36.0 g/dL Final  . RDW 08/11/2019 12.5  11.5 - 15.5 % Final  . Platelet Count 08/11/2019 264  150 - 400 K/uL Final  . nRBC 08/11/2019 0.0  0.0 - 0.2 % Final  . Neutrophils Relative % 08/11/2019 71  % Final  . Neutro Abs 08/11/2019 4.7  1.7 - 7.7 K/uL Final  . Lymphocytes Relative 08/11/2019 22  % Final  . Lymphs Abs 08/11/2019 1.5  0.7 - 4.0 K/uL Final  . Monocytes Relative 08/11/2019 5  % Final  . Monocytes Absolute 08/11/2019 0.4  0.1 - 1.0 K/uL Final  . Eosinophils Relative 08/11/2019 1  % Final  . Eosinophils Absolute 08/11/2019 0.1  0.0 - 0.5  K/uL Final  . Basophils Relative 08/11/2019 1  % Final  . Basophils Absolute 08/11/2019 0.0  0.0 - 0.1 K/uL Final  . Immature Granulocytes 08/11/2019 0  % Final  . Abs Immature Granulocytes 08/11/2019 0.02  0.00 - 0.07 K/uL Final   Performed at [facility], [place] [address], [place], [place] [address]  . Sodium 08/11/2019 145  135 - 145 mmol/L Final  . Potassium  08/11/2019 4.2  3.5 - 5.1 mmol/L Final  . Chloride 08/11/2019 107  98 - 111 mmol/L Final  . CO2 08/11/2019 28  22 - 32 mmol/L Final  . Glucose, Bld 08/11/2019 90  70 - 99 mg/dL Final   Glucose reference range applies only to samples taken after fasting for at least 8 hours.  . BUN 08/11/2019 13  6 - 20 mg/dL Final  . Creatinine 08/11/2019 0.87  0.44 - 1.00 mg/dL Final  . Calcium 08/11/2019 9.5  8.9 - 10.3 mg/dL Final  . Total Protein 08/11/2019 6.9  6.5 - 8.1 g/dL Final  . Albumin 08/11/2019 4.2  3.5 - 5.0 g/dL Final  . AST 08/11/2019 23  15 - 41 U/L Final  . ALT 08/11/2019 21  0 - 44 U/L Final  . Alkaline Phosphatase 08/11/2019 101  38 - 126 U/L Final  . Total Bilirubin 08/11/2019 0.6  0.3 - 1.2 mg/dL Final  . GFR, Est Non Af Am 08/11/2019 >60  >60 mL/min Final  . GFR, Est AFR Am 08/11/2019 >60  >60 mL/min Final  . Anion gap 08/11/2019 10  5 - 15 Final   Performed at [facility], [place] [address], [place], [place] [address]  . TSH 08/11/2019 0.952  0.308 - 3.960 uIU/mL Final   Performed at [facility], [place] [address], [place], [place] [address]  . Ferritin 08/11/2019 42  11 - 307 ng/mL Final   Performed at [facility], [place] [address], [place], [place] [address]  . Testosterone 08/11/2019 <3* 8 - 48 ng/dL Final   Comment: (NOTE)     **Effective Aug 18, 2019 Testosterone, Serum**       reference interval will be changing to:             Age                Female          Female          0 - 30 days          0 - 650        4 - 190          1 -  5 months        0 - 650        0 -  42               6 months        0 -  41        0 -  20          9m  1 year          0 -  36        1 -  26          2 -  5 years         0 -  322       3 -  364  6 -  8 years         0 -  36        3 -  25          9 - 10 years         0 -  21        1 -  33              11 years         1 - 161        5 -  58              12  years         2 - 521        5 -  58         13 - 15 years        28 - 656       12 -  71         16 - 17 years       150 - 785       12 -  71         18 - 19 years       150 - 785       13 -  71         20 - 30 years       264 - 916       13 -  71         31 - 40 years       264 - 916        8 -  26         41 - 60 years       46 - 916        4 -  54         61 - 80 years       102                          4 - 52        3 -  19             >80 years       59 - 75        2 -  66 Performed At: [facility] [place], [place] [id number] Rush Farmer MD IB:[id number]   ]   DIAGNOSTIC IMAGING:  Most recent mammogram: n/a s/p bilateral mastectomies CT Chest:  CLINICAL DATA:  Breast cancer. Chest pain  EXAM: CT CHEST WITH CONTRAST  TECHNIQUE: Multidetector CT imaging of the chest was performed during intravenous contrast administration.  CONTRAST:  12m OMNIPAQUE IOHEXOL 300 MG/ML  SOLN  COMPARISON:  03/29/2017  FINDINGS: Cardiovascular: The heart size is normal. No substantial pericardial effusion. No thoracic aortic aneurysm.  Mediastinum/Nodes: No mediastinal lymphadenopathy. No evidence for gross hilar lymphadenopathy although assessment is limited by the lack of intravenous contrast on today's study. The esophagus has normal imaging features. There is no axillary lymphadenopathy.  Lungs/Pleura: No suspicious pulmonary nodule or mass. Tiny 2 mm left lower lobe nodule on 91/7 is stable in the more than 2 year interval consistent with benign etiology. No focal airspace consolidation. No pleural effusion.  Upper Abdomen: 3 hypervascular lesions are identified in the liver. These were not visible on previous CT  or an MRI from 12/13/2016. 10 mm lesion noted posterior hepatic dome on 01/25/2. Lateral segment left liver lesion measuring 7 mm is seen on 140/2. Another 11 mm lateral segment left liver lesion is visible on  145/2.  Musculoskeletal: No worrisome lytic or sclerotic osseous abnormality.  IMPRESSION: 1. No acute findings in the chest. Specifically, no findings to explain the patient's history of chest pain. 2. 3 hypervascular lesions are identified in the liver. These were not visible on previous CT or MRI of 12/13/2016. These may be flash filling hemangiomas, but MRI of the abdomen without and with contrast recommended to further evaluate.  These results will be called to the ordering clinician or representative by the Radiologist Assistant, and communication documented in the PACS or [employer].   Electronically Signed   By: Misty Stanley M.D.   On: 08/11/2019 15:02  MRI abd:  CLINICAL DATA:  Hypervascular liver lesions seen on recent chest CT. History of breast cancer.  EXAM: MRI ABDOMEN WITHOUT AND WITH CONTRAST  TECHNIQUE: Multiplanar multisequence MR imaging of the abdomen was performed both before and after the administration of intravenous contrast.  CONTRAST:  96m GADAVIST GADOBUTROL 1 MMOL/ML IV SOLN  COMPARISON:  Chest CT 08/11/2019  FINDINGS: Lower chest: Unremarkable.  Hepatobiliary: The hypervascular lesion in the posterior right hepatic dome measures 10 mm on MRI today and is visible only on the arterial phase of postcontrast imaging (image 27 of series 15). This lesion shows subtle T2 hyperintensity (9/5) and is subtle but visible on out of phase T1 imaging (22/6).  The 2 lesions seen in the lateral segment left liver on previous CT are not discernible by MRI today.  There is no evidence for gallstones, gallbladder wall thickening, or pericholecystic fluid. No intrahepatic or extrahepatic biliary dilation.  Pancreas: No focal mass lesion. No dilatation of the main duct. No intraparenchymal cyst. No peripancreatic edema.  Spleen:  No splenomegaly. No focal mass lesion.  Adrenals/Urinary Tract: No adrenal nodule or mass. Tiny  cyst noted upper pole right kidney left kidney unremarkable.  Stomach/Bowel: Stomach is unremarkable. No gastric wall thickening. No evidence of outlet obstruction. Duodenum is normally positioned as is the ligament of Treitz. No small bowel or colonic dilatation within the visualized abdomen.  Vascular/Lymphatic: No abdominal aortic aneurysm. Portal vein, superior mesenteric vein, and splenic vein are patent.  Other:  No intraperitoneal free fluid.  Musculoskeletal: No suspicious marrow enhancement within the visualized bony anatomy. Cavernous hemangioma noted L4 vertebral body, stable.  IMPRESSION: 1. 10 mm posterior right hepatic dome lesion is indeterminate by MRI but not overtly suspicious for metastatic disease. The 2 lesions seen in the lateral segment left liver on previous CT are not discernible by MRI today. These findings are likely benign but warrant close follow-up. As they are best appreciated on CT imaging, a follow-up multiphase liver protocol abdominal CT in 3 months recommended.   Electronically Signed   By: EMisty StanleyM.D.   On: 08/26/2019 14:01    ASSESSMENT AND PLAN:  Ms.. EChismaris a pleasant 49y.o. female with history of recurrent breast cancer, ER+/PR+/HER2+, diagnosed initially in 2013 treated with bilateral mastectomies, neoadjuvant chemotherapy, maintenance trastuzumab, and tamoxifen, with recurrence in 2015 followed by right lumpectomy , TDM-1 x 8 doses, adjuvant radiation therapy, and anti estrogen therapy x 3 years with aromatase inhibitors.    1. History of breast cancer:  Ms. ENarvaizis currently clinically and radiographically without evidence of disease or recurrence of breast cancer.  [name] will return in one year for LTS follow up.  I encouraged her to call me with any questions or concerns before her next visit at the cancer center, and I would be happy to see her sooner, if needed.    2. Liver lesion: We spent a great deal of time  talking about the MRI and what additional testing [name] needs.  I am hesitant to continue to order surveillance scans in a short time period due to the degree of radiation exposure that [name] has and will potentially receive.  I will call our radiologists and review my concern with them.    3. Aches and pains: [name] has many aches/pains and headaches.  I recommended that [name] get a PCP, and f/u with her PCP about her overall general health.  They can always do additional work ups, such as rheumatologic panels, etc for her pain.  This sounds like arthritis.    4. Recurrent headaches: I referred her to neurology as [name] is having frequent headaches and may benefit from a preventative medication.    5. Supplements: Sagal brought several supplements for me to evaluate.  I gave her information on the [facility] about herbs app for her to evaluate.  I also reviewed with her that it is the recommendation of NCCN and ACS that patients get a majority of their nutrients from food, and avoid supplements unless directed by a doctor.  Some of the supplements [name] brought I couldn't find in the [name].  I let her know that I didn't have enough information to recommend one way or the other.    6. Bone health:  [name] was given education on specific food and activities to promote bone health.  7. Cancer screening:  Due to [name]'s history and her age, [name] should receive screening for skin cancers, colon cancer, and gynecologic cancers. [name] was encouraged to follow-up with her PCP for appropriate cancer screenings.   8. Health maintenance and wellness promotion: [name] was encouraged to consume 5-7 servings of fruits and vegetables per day. [name] was also encouraged to engage in moderate to vigorous exercise for 30 minutes per day most days of the week. [name] was instructed to limit her alcohol consumption and continue to abstain from tobacco use.     Dispo:  -Return to cancer center in one year for LTS follow  up -Neurology referral  Total encounter time: 60 minutes*  Wilber Bihari, NP 09/01/19 12:31 PM Medical Oncology and Hematology [facility] [place], [place] [address] Tel. [phone]    Fax. [phone]  *Total Encounter Time as defined by the Centers for Medicare and Medicaid Services includes, in addition to the face-to-face time of a patient visit (documented in the note above) non-face-to-face time: obtaining and reviewing outside history, ordering and reviewing medications, tests or procedures, care coordination (communications with other health care professionals or caregivers) and documentation in the medical record.

## 2019-08-29 ENCOUNTER — Telehealth: Payer: Self-pay | Admitting: Adult Health

## 2019-08-29 ENCOUNTER — Encounter: Payer: Self-pay | Admitting: Neurology

## 2019-08-29 NOTE — Telephone Encounter (Signed)
Scheduled appts per 5/20 LOS. Pt confirmed appt date and time.

## 2019-09-02 ENCOUNTER — Telehealth: Payer: Self-pay

## 2019-09-02 NOTE — Telephone Encounter (Signed)
Pt called regarding test results and voices concerns about lesions on liver and whether or not she should be concerned for disease progression, and is looking for clarifying information regarding these results and where to go from here. I explained to pt that Mendel Ryder will return to work 09/03/19 and this message has been relayed to Wilber Bihari, NP for f/u.

## 2019-09-03 ENCOUNTER — Other Ambulatory Visit: Payer: Self-pay | Admitting: Adult Health

## 2019-09-03 DIAGNOSIS — C787 Secondary malignant neoplasm of liver and intrahepatic bile duct: Secondary | ICD-10-CM

## 2019-09-03 NOTE — Progress Notes (Signed)
Reviewed patient clinical history with Dr. Tery Sanfilippo, the radiologist who read her 5/18 abdominal MRI.  We talked about the chest CT and the liver lesions that showed up in the liver.  I reviewed with him, her risk for recurrence, and what her next steps should be.  Dr. Tery Sanfilippo recommends a PET scan to evaluate for hypermetabolism, and then depending on that a multiphase abdominal CT can be performed later at that time.    Wilber Bihari, NP

## 2019-09-22 ENCOUNTER — Telehealth: Payer: Self-pay | Admitting: *Deleted

## 2019-09-22 NOTE — Telephone Encounter (Signed)
Reviewed medications with MD and per MD okay for pt to continue taking.  Pt notified and verbalized understanding.

## 2019-09-22 NOTE — Telephone Encounter (Signed)
Received call from pt requesting advice from MD on medication usage. Pt states she has possible liver involvement and would like MD advice prior to taking medication.  Pt states she would like to take OTC multi collagen supplements and CoQ10. Pt also states pt dermatoligist has prescribed pt finasteride 2.5 mg tablet p.o. daily for hair re-growth. RN will review with MD for further evaluation.

## 2019-09-24 ENCOUNTER — Ambulatory Visit (HOSPITAL_COMMUNITY)
Admission: RE | Admit: 2019-09-24 | Discharge: 2019-09-24 | Disposition: A | Discharge status: Home / Self Care | Payer: 59 | Source: Ambulatory Visit | Attending: Adult Health | Admitting: Adult Health

## 2019-09-24 ENCOUNTER — Other Ambulatory Visit: Payer: Self-pay

## 2019-09-24 DIAGNOSIS — C787 Secondary malignant neoplasm of liver and intrahepatic bile duct: Secondary | ICD-10-CM | POA: Insufficient documentation

## 2019-09-24 MED ORDER — FLUDEOXYGLUCOSE F - 18 (FDG) INJECTION: 6.3000 | Freq: Once | INTRAVENOUS | Status: DC | PRN

## 2019-09-25 ENCOUNTER — Telehealth: Payer: Self-pay | Admitting: Adult Health

## 2019-09-25 LAB — GLUCOSE, CAPILLARY: Glucose-Capillary: 81 mg/dL (ref 70–99)

## 2019-09-25 NOTE — Telephone Encounter (Signed)
Contact patient to verify telephone visit for pre reg °

## 2019-09-26 ENCOUNTER — Encounter: Payer: Self-pay | Admitting: Adult Health

## 2019-09-26 ENCOUNTER — Inpatient Hospital Stay: Payer: 59 | Attending: Oncology | Admitting: Adult Health

## 2019-09-29 ENCOUNTER — Telehealth: Payer: Self-pay | Admitting: Adult Health

## 2019-09-29 NOTE — Telephone Encounter (Signed)
No 6/18 los. No changes made to pt's schedule.

## 2019-11-20 ENCOUNTER — Encounter: Payer: Self-pay | Admitting: Gastroenterology

## 2019-11-20 ENCOUNTER — Ambulatory Visit (INDEPENDENT_AMBULATORY_CARE_PROVIDER_SITE_OTHER): Payer: 59 | Admitting: Gastroenterology

## 2019-11-20 VITALS — BP 119/68 | HR 68 | Ht 63.0 in | Wt 125.0 lb

## 2019-11-20 DIAGNOSIS — R1013 Epigastric pain: Secondary | ICD-10-CM

## 2019-11-20 DIAGNOSIS — K219 Gastro-esophageal reflux disease without esophagitis: Secondary | ICD-10-CM | POA: Diagnosis not present

## 2019-11-20 HISTORY — DX: Gastro-esophageal reflux disease without esophagitis: K21.9

## 2019-11-20 HISTORY — DX: Epigastric pain: R10.13

## 2019-11-20 MED ORDER — SUCRALFATE 1 G PO TABS
1.0000 g | ORAL_TABLET | Freq: Three times a day (TID) | ORAL | 1 refills | Status: DC
Start: 2019-11-20 — End: 2021-07-05

## 2019-11-20 MED ORDER — PANTOPRAZOLE SODIUM 40 MG PO TBEC
40.0000 mg | DELAYED_RELEASE_TABLET | Freq: Every day | ORAL | 11 refills | Status: DC
Start: 2019-11-20 — End: 2021-07-05
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
## 2019-11-20 NOTE — Patient Instructions (Signed)
If you are age 50 or older, your body mass index should be between 23-30. Your Body mass index is 22.14 kg/m. If this is out of the aforementioned range listed, please consider follow up with your Primary Care Provider.  If you are age 41 or younger, your body mass index should be between 19-25. Your Body mass index is 22.14 kg/m. If this is out of the aformentioned range listed, please consider follow up with your Primary Care Provider.   We have sent the following medications to your pharmacy for you to pick up at your convenience: Pantoprazole 40 mg daily.  Carafate tablet 4 times daily before meals and at bedtime.

## 2019-11-21 ENCOUNTER — Encounter: Payer: Self-pay | Admitting: Genetic Counselor

## 2019-11-24 NOTE — Progress Notes (Signed)
Reviewed and agree with documentation and assessment and plan. K. Veena Sheneka Schrom , MD   

## 2019-11-25 NOTE — Progress Notes (Signed)
NEUROLOGY CONSULTATION NOTE  [name] MRN: [id number] DOB: [birth year]  Referring provider: Gardenia Phlegm, NP Primary care provider: No PCP  Reason for consult:  headache  HISTORY OF PRESENT ILLNESS: [name] is a 49 year old right-handed female with history of breast cancer s/p bilateral mastectomies, radiation and chemotherapy who presents for headache.  History supplemented by referring provider.  She has had headaches since her early 61s.    She has history of recurrent breast cancer (2012 and 2015) status post bilateral mastectomies, radiating and chemotherapy.  She is currently in remission without evidence of disease.  She developed severe headaches during her treatment in 2015.  She has had headaches since then.  Often, she wakes up with pressure-like headaches (pulsating in temples).  Sometimes they may occur later in the day.  Sometimes they are moderate if they occur later in the day, but otherwise severe, either at base of her skull radiating up to the front and in the temples.  No neck pain.  She has associated photophobia, phonophobia, rarely nausea, no associated visual disturbance.  They last usually a couple of hours but may last 1/2 a day.  Severe headaches occur about 10 days a months.  She takes Excedrin daily, which helps.  No specific trigger.  She does report degenerative disc disease in her cervical spine.  Changing pillows ineffective.  MRI of brain with and without contrast on 03/29/2016 personally reviewed and was unremarkable.  CT soft tissue of neck from 12/29/15 showed C6-7 disc degeneration with severe left neural foraminal stenosis.  She has episodes of dizziness when she is bent over and then stands up.  She also reports short term memory problems.  She reports daytime fatigue.  Current NSAIDS:  none Current analgesics:  Excedrin Migraine Current triptans:  none Current ergotamine:  none Current anti-emetic:  none Current muscle  relaxants:  none Current anti-anxiolytic:  none Current sleep aide:  none Current Antihypertensive medications:  none Current Antidepressant medications:  none Current Anticonvulsant medications:  none Current anti-CGRP:  none Current Vitamins/Herbal/Supplements:  Co-Q10 158m daily, D, biotin, turmeric curcumin Current Antihistamines/Decongestants:  none Other therapy:  Ice pack on back of neck and over eyes. Hormone/birth control:  Vitafol Fe  Past NSAIDS:  Ibuprofen, naproxen Past analgesics:  Tylenol Past abortive triptans:  none Past abortive ergotamine:  none Past muscle relaxants:  none Past anti-emetic:  none Past antihypertensive medications:  none Past antidepressant medications:  none Past anticonvulsant medications:  none Past anti-CGRP:  none Past vitamins/Herbal/Supplements:  none Past antihistamines/decongestants:  none Other past therapies:  none  Caffeine:  No coffee. Mt Dew rarely Diet:  Drinks plenty of water.  Not a big eater.  Eats protein bars, fruits and vegetables.  Sometimes a doughnut. Exercise:  Active but not routine Mood:  Does not feel depressed but not as happy as she used to be. Other pain:  Low back pain Sleep hygiene:  She thinks she sleeps well.  Daytime fatigue.  Reports difficulty with short-term memory.  Feels "foggy".  She is a "mouth breather".  TSH was 0.952.   PAST MEDICAL HISTORY: Past Medical History:  Diagnosis Date  . Anxiety   . Asthma    as a child  . Breast cancer ([facility] Sept 2012   right breast, inner lower right  . Cervical dysplasia   . Genetic testing 02/01/2018   Genetic testing was performed in 2015. The [employer] OvaNext panel was ordered. In total, 23 genes were  analyzed as part of this panel: ATM, BARD1, BRCA1, BRCA2, BRIP1, CDH1, CHEK2, EPCAM, MLH1, MRE11A, MSH2, MSH6, MUTYH, NBN, NF1, PALB2, PMS2, PTEN, RAD50, RAD51C, RAD51D, STK11, and TP53. No pathogenic variants were detected. Genetic testing did detect a Variant of  Unknown Significance (VUS) at the t  . GERD (gastroesophageal reflux disease)    had during chemo  . Hx of radiation therapy 11/24/13- 01/08/14   reconstructed right breast/chest wall 4500 cGy 25 sessions, electron beam boost 1440 cGy 8 sessions  . Kidney stones   . Migraines   . Pneumonia   . PONV (postoperative nausea and vomiting)   . Rash    neck    PAST SURGICAL HISTORY: Past Surgical History:  Procedure Laterality Date  . ADENOIDECTOMY    . BREAST LUMPECTOMY WITH NEEDLE LOCALIZATION Right 05/05/2013   Procedure: EXCISION RECURRENT CANCER RIGHT BREAST WITH NEEDLE LOCALOZATION;  Surgeon: Adin Hector, MD;  Location: [place];  Service: General;  Laterality: Right;  . BREAST RECONSTRUCTION  06/29/2011   Procedure: BREAST RECONSTRUCTION;  Surgeon: Theodoro Kos, DO;  Location: [place];  Service: Plastics;  Laterality: Bilateral;  Immediate Bilateral Breast Reconstruction with Bilateral Tissue Expanders and placement of  Alloderm   . DILATION AND CURETTAGE OF UTERUS    . fibroid tumor surgery    . lazy eye     corrective surgery  . MASTECTOMY W/ SENTINEL NODE BIOPSY  06/29/2011   Procedure: MASTECTOMY WITH SENTINEL LYMPH NODE BIOPSY;  Surgeon: Adin Hector, MD;  Location: [place];  Service: General;  Laterality: Right;  right skin spairing mstectomy and right sentinel lymph node biopsy  . PORT-A-CATH REMOVAL  03/12/2012   Procedure: MINOR REMOVAL PORT-A-CATH;  Surgeon: Adin Hector, MD;  Location: [place];  Service: General;  Laterality: N/A;  Upper left  . PORTACATH PLACEMENT Left 05/05/2013   Procedure: INSERTION PORT-A-CATH;  Surgeon: Adin Hector, MD;  Location: [place];  Service: General;  Laterality: Left;  . RHINOPLASTY    . uterine cervical treatments  2002   for precancerous lesions    MEDICATIONS: Current Outpatient Medications on File Prior to Visit  Medication Sig Dispense Refill  . aspirin-acetaminophen-caffeine (EXCEDRIN MIGRAINE) 250-250-65 MG  tablet Take by mouth every 6 (six) hours as needed for headache.    . Biotin 5000 MCG CAPS Take 5,000 mcg by mouth daily.    Renard Hamper Pepper-Turmeric (TURMERIC CURCUMIN) 08-998 MG CAPS Take by mouth 2 (two) times daily.    . Cholecalciferol (VITAMIN D3) 125 MCG (5000 UT) TBDP Take 1 tablet by mouth daily.    . Cobalamin Combinations (NEURIVA PLUS) CAPS Take by mouth daily.    . Coenzyme Q10 (COQ10) 150 MG CAPS Take 1 capsule by mouth once.    . finasteride (PROSCAR) 5 MG tablet Take 5 mg by mouth daily. 1/2 tablet daily    . Misc Natural Products (LUTEIN 20) CAPS Take by mouth daily.    . pantoprazole (PROTONIX) 40 MG tablet Take 1 tablet (40 mg total) by mouth daily. 30 tablet 11  . Prenat-Fe Poly-Methfol-FA-DHA (VITAFOL FE+) 90-0.6-0.4-200 MG CAPS Take 1 tablet by mouth daily. 30 capsule 11  . sucralfate (CARAFATE) 1 g tablet Take 1 tablet (1 g total) by mouth 4 (four) times daily -  with meals and at bedtime. 120 tablet 1   No current facility-administered medications on file prior to visit.    ALLERGIES: Allergies  Allergen Reactions  . Tussionex Pennkinetic Er [Hydrocod Polst-Cpm Polst  Er] Other (See Comments)    hallucinations  . Codeine Other (See Comments)    unkown  . Morphine Rash  . Morphine And Related Anxiety    anxiety  . Penicillin G Rash  . Penicillins Rash    FAMILY HISTORY: Family History  Adopted: Yes  Problem Relation Age of Onset  . Heart attack Father   . Cancer Cousin 40       melanoma; mat first cousin, located on neck  . Colon cancer Mother 27  . Rectal cancer Mother   . Cancer Maternal Aunt 60       stomach and ovarian as separate primaries  . Cancer Maternal Uncle 65       lung; smoker  . Cancer Maternal Uncle 79       prostate  . Cancer Maternal Grandmother 69       enodometrial  . Cancer Maternal Aunt 45       breast  . Stomach cancer Maternal Aunt     SOCIAL HISTORY: Social History   Socioeconomic History  . Marital status: Married      Spouse name: Not on file  . Number of children: Not on file  . Years of education: Not on file  . Highest education level: Not on file  Occupational History  . Not on file  Tobacco Use  . Smoking status: Former Smoker    Quit date: 07/20/1996    Years since quitting: 23.3  . Smokeless tobacco: Never Used  Substance and Sexual Activity  . Alcohol use: No    Alcohol/week: 0.0 standard drinks  . Drug use: No  . Sexual activity: Yes    Partners: Male  Other Topics Concern  . Not on file  Social History Narrative   She was adopted by her grandparents as a child.  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  She works in a Engineer, agricultural.   Social Determinants of Health   Financial Resource Strain:   . Difficulty of Paying Living Expenses:   Food Insecurity:   . Worried About Charity fundraiser in the Last Year:   . Arboriculturist in the Last Year:   Transportation Needs:   . Film/video editor (Medical):   [name] Lack of Transportation (Non-Medical):   Physical Activity:   . Days of Exercise per Week:   . Minutes of Exercise per Session:   Stress:   . Feeling of Stress :   Social Connections:   . Frequency of Communication with Friends and Family:   . Frequency of Social Gatherings with Friends and Family:   . Attends Religious Services:   . Active Member of Clubs or Organizations:   . Attends Archivist Meetings:   [name] Marital Status:   Intimate Partner Violence:   . Fear of Current or Ex-Partner:   . Emotionally Abused:   [name] Physically Abused:   . Sexually Abused:     PHYSICAL EXAM: Vitals:   11/26/19 1122  BP: 100/67  Pulse: 64  Resp: 20  SpO2: 96%   General: No acute distress but anxious.  Patient appears well-groomed.   Head:  Normocephalic/atraumatic Eyes:  fundi examined but not visualized Neck: supple, no paraspinal tenderness, full range of motion Back: No paraspinal tenderness Heart: regular rate and  rhythm Lungs: Clear to auscultation bilaterally. Vascular: No carotid bruits. Neurological Exam: Mental status: alert and oriented to person, place, and time, recent and remote memory  intact, fund of knowledge intact, attention and concentration intact, speech fluent and not dysarthric, language intact. Cranial nerves: CN I: not tested CN II: pupils equal, round and reactive to light, visual fields intact CN III, IV, VI:  full range of motion, no nystagmus, no ptosis CN V: facial sensation intact CN VII: upper and lower face symmetric CN VIII: hearing intact CN IX, X: gag intact, uvula midline CN XI: sternocleidomastoid and trapezius muscles intact CN XII: tongue midline Bulk & Tone: normal, no fasciculations. Motor:  5/5 throughout Sensation:  Pinprick and vibration sensation intact.   Deep Tendon Reflexes:  2+ throughout, toes downgoing.   Finger to nose testing:  Without dysmetria.   Heel to shin:  Without dysmetria.   Gait:  Normal station and stride.  Able to turn and tandem walk. Romberg negative.  IMPRESSION: 1.  Chronic migraine without aura, without status migrainosus, not intractable.  Possible cervicogenic component given the suboccipital location and findings on prior neck imaging. 2.  Memory deficits.  Consider secondary to anxiety vs residual from chemotherapy. 3.  Given morning headache and daytime fatigue, consider evaluation for OSA 4.  History of breast cancer  PLAN: 1.  Check MRI of brain with and without contrast to evaluate for secondary etiology for headaches and memory deficits. 2.  Order neuropsychological testing 3.  Order sleep study for evaluation of OSA 4.  Start topiramate 40m at bedtime.  Increase to 521mat bedtime in 4 weeks if no improvement.  Given her memory complaints, I would rather try nortriptyline but patient does not want to take any medication that may cause weight gain. 5.  Rizatriptan for rescue therapy.  Stop Excedrin 6.  Limit use of  pain relievers to no more than 2 days out of week to prevent risk of rebound or medication-overuse headache. 7.  Keep headache diary  Thank you for allowing me to take part in the care of this patient.  AdMetta ClinesDO  CC:  LiWilber BihariNP

## 2019-11-26 ENCOUNTER — Other Ambulatory Visit: Payer: Self-pay

## 2019-11-26 ENCOUNTER — Ambulatory Visit (INDEPENDENT_AMBULATORY_CARE_PROVIDER_SITE_OTHER): Payer: 59 | Admitting: Neurology

## 2019-11-26 ENCOUNTER — Encounter: Payer: Self-pay | Admitting: Neurology

## 2019-11-26 VITALS — BP 100/67 | HR 64 | Resp 20 | Ht 63.5 in | Wt 125.0 lb

## 2019-11-26 DIAGNOSIS — R413 Other amnesia: Secondary | ICD-10-CM | POA: Diagnosis not present

## 2019-11-26 DIAGNOSIS — R400 Somnolence: Secondary | ICD-10-CM

## 2019-11-26 DIAGNOSIS — C50911 Malignant neoplasm of unspecified site of right female breast: Secondary | ICD-10-CM | POA: Diagnosis not present

## 2019-11-26 DIAGNOSIS — G43709 Chronic migraine without aura, not intractable, without status migrainosus: Secondary | ICD-10-CM | POA: Diagnosis not present

## 2019-11-26 MED ORDER — TOPIRAMATE 25 MG PO TABS: 25.0000 mg | ORAL_TABLET | Freq: Every day | ORAL | 5 refills | Status: DC

## 2019-11-26 MED ORDER — RIZATRIPTAN BENZOATE 10 MG PO TABS
ORAL_TABLET | ORAL | 5 refills | Status: DC
Start: 2019-11-26 — End: 2020-07-20

## 2019-11-26 NOTE — Patient Instructions (Signed)
  1. Start topiramate 25mg  at bedtime.  Contact us in 4 weeks with update and we can increase dose if needed. 2. Take rizatriptan 10mg  at earliest onset of headache.  May repeat dose once in 2 hours if needed.  Maximum 2 tablets in 24 hours. 3. STOP EXCEDRIN 4. Limit use of pain relievers to no more than 2 days out of the week.  These medications include acetaminophen, NSAIDs (ibuprofen/Advil/Motrin, naproxen/Aleve, triptans (Imitrex/sumatriptan), Excedrin, and narcotics.  This will help reduce risk of rebound headaches. 5. Be aware of common food triggers:  - Caffeine:  coffee, black tea, cola, Mt. Dew  - Chocolate  - Dairy:  aged cheeses (brie, blue, cheddar, gouda, [place], provolone, [place], Swiss, etc), chocolate milk, buttermilk, sour cream, limit eggs and yogurt  - Nuts, peanut butter  - Alcohol  - Cereals/grains:  FRESH breads (fresh bagels, sourdough, doughnuts), yeast productions  - Processed/canned/aged/cured meats (pre-packaged deli meats, hotdogs)  - MSG/glutamate:  soy sauce, flavor enhancer, pickled/preserved/marinated foods  - Sweeteners:  aspartame (Equal, Nutrasweet).  Sugar and Splenda are okay  - Vegetables:  legumes (lima beans, lentils, snow peas, fava beans, pinto peans, peas, garbanzo beans), sauerkraut, onions, olives, pickles  - Fruit:  avocados, bananas, citrus fruit (orange, lemon, grapefruit), mango  - Other:  Frozen meals, macaroni and cheese 6. Routine exercise 7. Stay adequately hydrated (aim for 64 oz water daily) 8. Keep headache diary 9. Maintain proper stress management 10. Maintain proper sleep hygiene 11. Do not skip meals 12. Consider supplements:  magnesium citrate 400mg  daily, riboflavin 400mg  daily, coenzyme Q10 100mg  three times daily. 13. Order sleep study 14. Refer for neurocognitive testing

## 2019-11-27 ENCOUNTER — Other Ambulatory Visit: Payer: Self-pay

## 2019-11-27 ENCOUNTER — Telehealth: Payer: Self-pay | Admitting: Neurology

## 2019-11-27 DIAGNOSIS — G43709 Chronic migraine without aura, not intractable, without status migrainosus: Secondary | ICD-10-CM

## 2019-11-27 DIAGNOSIS — G4459 Other complicated headache syndrome: Secondary | ICD-10-CM

## 2019-11-27 NOTE — Telephone Encounter (Signed)
We can order MRI of brain with and without contrast

## 2019-11-27 NOTE — Telephone Encounter (Signed)
LMOVM pt to call back we can order a MRI of the Brain if she feels like that would help.

## 2019-11-27 NOTE — Telephone Encounter (Signed)
Patient states she was in the office yesterday for a visit, she would like to know what it means when you have a headache at the base of your skull and what might be causing it. She is worried there is something there. She would like to know what Dr Tomi Likens thinks that it is. She believes it is more than a headache because it is painful and debilitating. Please call.

## 2019-11-27 NOTE — Telephone Encounter (Signed)
Patient called in returning Sheena's call. 

## 2019-12-08 ENCOUNTER — Telehealth: Payer: Self-pay | Admitting: Neurology

## 2019-12-08 NOTE — Telephone Encounter (Signed)
Patient called requesting to speak with a nurse about a letter she received from her insurance company stating her sleep study was denied.

## 2019-12-08 NOTE — Telephone Encounter (Addendum)
Yes, I sent in appeal paperwork to [facility] on Thursday afternoon 12/04/19. They usually take about 2 days so if I haven't heard anything but this afternoon. I will call and check on it. I called and gave patient update.   She also wanted to report that she gets sleepy when she is driving and it has been like that for 10+ years. She doesn't feel like she is fully awake until lunchtime or after.  Thanks!

## 2019-12-08 NOTE — Telephone Encounter (Signed)
I would like to appeal -  reason:  excessive daytime sleepiness.

## 2019-12-09 ENCOUNTER — Encounter: Payer: 59 | Admitting: Psychology

## 2019-12-12 NOTE — Telephone Encounter (Signed)
Appeal has been submitted. Called to check status and it was still pending. Awaiting determination. Just FYI.

## 2019-12-16 ENCOUNTER — Encounter: Payer: 59 | Admitting: Psychology

## 2019-12-17 NOTE — Telephone Encounter (Signed)
Per fax received from [place] department on 12/12/19 they have all the info they need for appeal. They have begun their internal review and it should take up to 30 days for determination. Awaiting fax with decision.

## 2019-12-17 NOTE — Telephone Encounter (Signed)
Has this been completed?  Sending to [facility] for review: Okay to sign/close encounter or is further follow up needed?

## 2019-12-22 ENCOUNTER — Other Ambulatory Visit: Payer: Self-pay

## 2019-12-22 ENCOUNTER — Ambulatory Visit
Admission: RE | Admit: 2019-12-22 | Discharge: 2019-12-22 | Disposition: A | Discharge status: Home / Self Care | Payer: 59 | Source: Ambulatory Visit | Attending: Neurology | Admitting: Neurology

## 2019-12-22 DIAGNOSIS — G4459 Other complicated headache syndrome: Secondary | ICD-10-CM

## 2019-12-22 DIAGNOSIS — G43709 Chronic migraine without aura, not intractable, without status migrainosus: Secondary | ICD-10-CM

## 2019-12-22 MED ORDER — GADOBENATE DIMEGLUMINE 529 MG/ML IV SOLN
10.0000 mL | Freq: Once | INTRAVENOUS | Status: AC | PRN
  Administered 2019-12-22: 10 mL via INTRAVENOUS

## 2020-01-07 ENCOUNTER — Ambulatory Visit: Payer: 59 | Admitting: Gastroenterology

## 2020-01-12 NOTE — Telephone Encounter (Signed)
Per phone call with [place] has been denied. We can submit 2nd level appeal to Fax #: [phone]. Case #: [id number]; ID#: [id number]. Can not do Peer to Peer at this time. Only next step is to do second level appeal.   01/12/20- faxed forms to [facility] health for 2nd level appeal.

## 2020-01-20 ENCOUNTER — Other Ambulatory Visit: Payer: Self-pay

## 2020-01-20 ENCOUNTER — Encounter: Payer: Self-pay | Admitting: Psychology

## 2020-01-20 ENCOUNTER — Ambulatory Visit (INDEPENDENT_AMBULATORY_CARE_PROVIDER_SITE_OTHER): Payer: 59 | Admitting: Psychology

## 2020-01-20 ENCOUNTER — Ambulatory Visit: Payer: 59 | Admitting: Psychology

## 2020-01-20 DIAGNOSIS — G4719 Other hypersomnia: Secondary | ICD-10-CM | POA: Diagnosis not present

## 2020-01-20 DIAGNOSIS — F331 Major depressive disorder, recurrent, moderate: Secondary | ICD-10-CM | POA: Diagnosis not present

## 2020-01-20 DIAGNOSIS — R4189 Other symptoms and signs involving cognitive functions and awareness: Secondary | ICD-10-CM | POA: Diagnosis not present

## 2020-01-20 NOTE — Progress Notes (Signed)
   Psychometrician Note   Cognitive testing was administered to [name] by [name], B.S. (psychometrist) under the supervision of Dr. Christia Reading, Ph.D., licensed psychologist on 01/20/20. [name] did not appear overtly distressed by the testing session per behavioral observation or responses across self-report questionnaires. Dr. Christia Reading, Ph.D. checked in with [name] as needed to manage any distress related to testing procedures (if applicable). Rest breaks were offered.    The battery of tests administered was selected by Dr. Christia Reading, Ph.D. with consideration to [name]'s current level of functioning, the nature of her symptoms, emotional and behavioral responses during interview, level of literacy, observed level of motivation/effort, and the nature of the referral question. This battery was communicated to the psychometrist. Communication between Dr. Christia Reading, Ph.D. and the psychometrist was ongoing throughout the evaluation and Dr. Christia Reading, Ph.D. was immediately accessible at all times. Dr. Christia Reading, Ph.D. provided supervision to the psychometrist on the date of this service to the extent necessary to assure the quality of all services provided.    [name] will return within approximately 1-2 weeks for an interactive feedback session with [name] at which time her test performances, clinical impressions, and treatment recommendations will be reviewed in detail. [name] understands she can contact our office should she require our assistance before this time.  A total of 155 minutes of billable time were spent face-to-face with [name] by the psychometrist. This includes both test administration and scoring time. Billing for these services is reflected in the clinical report generated by Dr. Christia Reading, Ph.D..  This note reflects time spent with the psychometrician and does not include test scores or any  clinical interpretations made by [name]. The full report will follow in a separate note.

## 2020-01-20 NOTE — Progress Notes (Signed)
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   WAIS-IV Coding: 12 75 Above Average       NAB Attention Module, Form 1: T Score Percentile     Digits Forward 46 34 Average    Digits Backwards 52 58 Average       D-KEFS Color-Word Interference Test: Raw Score (Scaled Score) Percentile     Color Naming 28 secs. (11) 63 Average    Word Reading 22 secs. (11) 63 Average    Inhibition 54 secs. (11) 63 Average      Total Errors 2 errors (9) 37 Average    Inhibition/Switching 52 secs. (13) 84 Above Average      Total Errors 0 errors (12) 75 Above Average       D-KEFS Verbal Fluency Test: Raw Score (Scaled Score) Percentile     Letter Total Correct 28 (7) 16 Below Average    Category Total Correct 42 (12) 75 Above Average    Category Switching Total Correct 13 (10) 50 Average    Category Switching Accuracy 11 (9) 37 Average      Total Set Loss Errors 3 (9) 37 Average      Total Repetition Errors 2 (11) 63 Average       D-KEFS 20 Questions Test: Scaled Score Percentile     Total Weighted Achievement Score 6 9 Below Average    Initial Abstraction Score 8 25 Average        [place]consin Card Sorting Test: Raw Score Percentile     Categories (trials) 3 (64) >16 Within Normal Limits    Total Errors 14 54 Average    Perseverative Errors 8 50 Average    Non-Perseverative Errors 6 46 Average    Failure to Maintain Set 1 --- ---       Language:          Verbal Fluency Test: Raw Score (T Score) Percentile     Phonemic Fluency (FAS) 28 (39) 14 Below Average  Animal Fluency 19 (50) 50 Average        NAB Language Module, Form 1: T Score Percentile     Auditory Comprehension 58 79 Above Average    Naming 31/31 (56) 73 Average       Visuospatial/Visuoconstruction:      Raw Score Percentile   Clock Drawing: 10/10 --- Within Normal Limits       NAB Spatial Module, Form 1: T Score Percentile     Figure Drawing Copy 63 91 Well Above Average    Figure Drawing Immediate Recall 74 99 Exceptionally High        Scaled Score Percentile   WAIS-IV Block Design: 8 25 Average  WAIS-IV Matrix Reasoning: 10 50 Average       Mood and Personality:      Raw Score Percentile   Beck Depression Inventory - II: 21 --- Moderate  PROMIS Anxiety Questionnaire: 19 --- Mild       Additional Questionnaires:      Raw Score Percentile   PROMIS Sleep Disturbance Questionnaire: 23 --- None to Slight   Informed Consent and Coding/Compliance:   Ms. Buenaventura was provided with a verbal description of the nature and purpose of the present neuropsychological evaluation. Also reviewed were the foreseeable risks and/or discomforts and benefits of the procedure, limits of confidentiality, and mandatory reporting requirements of this provider. The patient was given the opportunity to ask questions and receive answers about the evaluation. Oral consent to participate was provided by the patient.   This evaluation was conducted by Christia Reading, Ph.D., licensed clinical neuropsychologist. Ms. Salsberry completed a comprehensive clinical interview with Dr. Melvyn Novas, billed as one unit [phone], and 155  minutes of cognitive testing and scoring, billed as one unit [phone] and four additional units [id number]. Psychometrist Milana Kidney, B.S., assisted Dr. Melvyn Novas with test administration and scoring procedures. As a separate and discrete service, Dr. Melvyn Novas spent a total of 130 minutes in interpretation and report writing billed as one unit [phone] and one unit [phone].

## 2020-01-30 ENCOUNTER — Ambulatory Visit (INDEPENDENT_AMBULATORY_CARE_PROVIDER_SITE_OTHER): Payer: 59 | Admitting: Psychology

## 2020-01-30 ENCOUNTER — Other Ambulatory Visit: Payer: Self-pay

## 2020-01-30 DIAGNOSIS — F331 Major depressive disorder, recurrent, moderate: Secondary | ICD-10-CM

## 2020-01-30 DIAGNOSIS — R4189 Other symptoms and signs involving cognitive functions and awareness: Secondary | ICD-10-CM

## 2020-01-30 NOTE — Progress Notes (Signed)
   Neuropsychology Feedback Session [name]. [facility] of [facility]  Reason for Referral:   [name] a 49 y.o. right-handed Caucasian female referred by Metta Clines, D.O.,to characterize hercurrent cognitive functioning and assist with diagnostic clarity and treatment planning in the context of subjective cognitive decline and history of chemotherapy/radiation treatment for breast cancer.   Feedback:   [name] completed a comprehensive neuropsychological evaluation on 01/20/2020. Please refer to that encounter for the full report and recommendations. Briefly, results suggested neuropsychological functioning within normal limits. She did exhibit a weakness across a list learning recognition task where she appeared to mix up words from the two lists she was presented. However, recognition trials across other memory measures were quite strong and this isolated weakness is not cause for concern. Across mood-related questionnaires, she reported acute symptoms of mild anxiety and moderate depression. While she did not report significant sleep dysfunction across a related-questionnaire, she did report prominent symptoms during interview, namely a longstanding history of non-restful sleep despite obtaining an adequate amount of sleep, excessive daytime sleepiness, and instances where she will wake up gasping for air and be unsure if she had stopped breathing while asleep. Ongoing psychiatric distress and sleep dysfunction can certainly create and maintain day-to-day cognitive difficulties, especially across the domains of processing speed, attention/concentration, executive functioning, and learning and memory.  [name] was unaccompanied during the current telephone call. She was within her residence while I was within my office. I discussed the limitations of evaluation and management by telemedicine and the availability of in person appointments. Ms.  [name] expressed her understanding and agreed to proceed. Content of the current session focused on the results of her neuropsychological evaluation. [name] was given the opportunity to ask questions and her questions were answered. She was encouraged to reach out should additional questions arise. A copy of her report was mailed at the conclusion of the previous visit.      26 minutes were spent conducting the current feedback session with [name], billed as one unit [phone].

## 2020-01-30 NOTE — Patient Instructions (Signed)
As previously recommended by Dr. Tomi Likens, I also recommend that Ms. Theil complete a laboratory sleep study given symptoms noted above which she described during interview. It will be important to rule in or out the presence of sleep apnea or any other cause of ongoing sleep dysfunction. If present and untreated, sleep apnea could certainly create cognitive dysfunction and would increase her risk for heart attack, stroke, and future cognitive decline (i.e., dementia).   A combination of medication and psychotherapy has been shown to be most effective at treating symptoms of anxiety and depression. As such, Ms. Glanz is encouraged to speak with her prescribing physician regarding medication adjustments to optimally manage these symptoms. Likewise, Ms. Dunivan is encouraged to consider engaging in short-term psychotherapy to address symptoms of psychiatric distress. She would benefit from an active and collaborative therapeutic environment, rather than one purely supportive in nature. Recommended treatment modalities include Cognitive Behavioral Therapy (CBT) or Acceptance and Commitment Therapy (ACT).  Should subjective cognitive dysfunction persist after mood and sleep-related variables are managed well, a repeat evaluation would be warranted at that time. The current evaluation will serve as an excellent baseline to compare any future evaluations against.   Ms. Everly is encouraged to attend to lifestyle factors for brain health (e.g., regular physical exercise, good nutrition habits, regular participation in cognitively-stimulating activities, and general stress management techniques), which are likely to have benefits for both emotional adjustment and cognition. In fact, in addition to promoting good general health, regular exercise incorporating aerobic activities (e.g., brisk walking, jogging, cycling, etc.) has been demonstrated to be a very effective treatment for depression and stress, with  similar efficacy rates to both antidepressant medication and psychotherapy. Optimal control of vascular risk factors (including safe cardiovascular exercise and adherence to dietary recommendations) is encouraged.   If interested, there are some activities which have therapeutic value and can be useful in keeping her cognitively stimulated. For suggestions, Ms. Chiao is encouraged to go to the following website: https://www.[facility].org/get-to-know-barrow/centers-programs/neurorehabilitation-center/neuro-rehab-apps-and-games/ which has options, categorized by level of difficulty. It should be noted that these activities should not be viewed as a substitute for therapy.  When learning new information, she would benefit from information being broken up into small, manageable pieces. She may also find it helpful to articulate the material in her own words and in a context to promote encoding at the onset of a new task. This material may need to be repeated multiple times to promote encoding.  Memory can be improved using internal strategies such as rehearsal, repetition, chunking, mnemonics, association, and imagery. External strategies such as written notes in a consistently used memory journal, visual and nonverbal auditory cues such as a calendar on the refrigerator or appointments with alarm, such as on a cell phone, can also help maximize recall.    To address problems with fluctuating attention, she may wish to consider:   -Avoiding external distractions when needing to concentrate   -Limiting exposure to fast paced environments with multiple sensory demands   -Writing down complicated information and using checklists   -Attempting and completing one task at a time (i.e., no multi-tasking)   -Verbalizing aloud each step of a task to maintain focus   -Reducing the amount of information considered at one time

## 2020-02-26 ENCOUNTER — Other Ambulatory Visit: Payer: Self-pay

## 2020-02-26 DIAGNOSIS — R400 Somnolence: Secondary | ICD-10-CM

## 2020-02-26 NOTE — Progress Notes (Signed)
Per DR.[facility] Referral for Sleep Specialist added

## 2020-05-27 NOTE — Progress Notes (Deleted)
NEUROLOGY FOLLOW UP OFFICE NOTE  [name] [id number]   Subjective:  [name] is a 50 year old right-handed female with history of breast cancer s/p bilateral mastectomies, radiation and chemotherapy who presents for follows up for migraines.  UPDATE: MRI of brain with and without contrast on 12/22/2019 personally reviewed was normal.  She underwent neuropsychological testing on 01/20/2020 which showed no evidence of cognitive impairment.  Intensity:  *** Duration:  *** Frequency:  *** Frequency of abortive medication: *** Current NSAIDS:  none Current analgesics:  none Current triptans:  rizatriptan 83m Current ergotamine:  none Current anti-emetic:  none Current muscle relaxants:  none Current anti-anxiolytic:  none Current sleep aide:  none Current Antihypertensive medications:  none Current Antidepressant medications:  none Current Anticonvulsant medications:  topiramate 267mat bedtime Current anti-CGRP:  none Current Vitamins/Herbal/Supplements:  Co-Q10 15068maily, D, biotin, turmeric curcumin Current Antihistamines/Decongestants:  none Other therapy:  Ice pack on back of neck and over eyes. Hormone/birth control:  Vitafol Fe  Caffeine:  No coffee. Mt Dew rarely Diet:  Drinks plenty of water.  Not a big eater.  Eats protein bars, fruits and vegetables.  Sometimes a doughnut. Exercise:  Active but not routine Mood:  Does not feel depressed but not as happy as she used to be. Other pain:  Low back pain Sleep hygiene:  She thinks she sleeps well.  Daytime fatigue.   HISTORY: She has had headaches since her early 40s39s  She has history of recurrent breast cancer (2012 and 2015) status post bilateral mastectomies, radiating and chemotherapy.  She is currently in remission without evidence of disease.  She developed severe headaches during her treatment in 2015.  She has had headaches since then.  Often, she wakes up with pressure-like headaches  (pulsating in temples).  Sometimes they may occur later in the day.  Sometimes they are moderate if they occur later in the day, but otherwise severe, either at base of her skull radiating up to the front and in the temples.  No neck pain.  She has associated photophobia, phonophobia, rarely nausea, no associated visual disturbance.  They last usually a couple of hours but may last 1/2 a day.  Severe headaches occur about 10 days a months.  She takes Excedrin daily, which helps.  No specific trigger.  She does report degenerative disc disease in her cervical spine.  Changing pillows ineffective.  MRI of brain with and without contrast on 03/29/2016 personally reviewed and was unremarkable.  CT soft tissue of neck from 12/29/15 showed C6-7 disc degeneration with severe left neural foraminal stenosis.  She has episodes of dizziness when she is bent over and then stands up.  She also reports short term memory problems.  She reports daytime fatigue.    Past NSAIDS:  Ibuprofen, naproxen Past analgesics:  Tylenol, Excedrin Migraine Past abortive triptans:  none Past abortive ergotamine:  none Past muscle relaxants:  none Past anti-emetic:  none Past antihypertensive medications:  none Past antidepressant medications:  none Past anticonvulsant medications:  none Past anti-CGRP:  none Past vitamins/Herbal/Supplements:  none Past antihistamines/decongestants:  none Other past therapies:  none   Reports difficulty with short-term memory.  Feels "foggy".  She is a "mouth breather".  TSH was 0.952.  PAST MEDICAL HISTORY: Past Medical History:  Diagnosis Date  . Abdominal pain, epigastric 11/20/2019  . Asthma    as a child  . Cervical dysplasia   . Gastroesophageal reflux disease 11/20/2019  .  Genetic testing 02/01/2018   Genetic testing was performed in 2015. The [facility] panel was ordered. In total, 23 genes were analyzed as part of this panel: ATM, BARD1, BRCA1, BRCA2, BRIP1, CDH1, CHEK2,  EPCAM, MLH1, MRE11A, MSH2, MSH6, MUTYH, NBN, NF1, PALB2, PMS2, PTEN, RAD50, RAD51C, RAD51D, STK11, and TP53. No pathogenic variants were detected. Genetic testing did detect a Variant of Unknown Significance (VUS) at the t  . GERD (gastroesophageal reflux disease)    had during chemo  . Headache 06/12/2013  . History of chemotherapy   . History of kidney stones   . History of radiation therapy 11/24/13- 01/08/14   reconstructed right breast/chest wall 4500 cGy 25 sessions, electron beam boost 1440 cGy 8 sessions  . Liver masses 12/06/2016  . Major depressive disorder, in remission 03/12/2017  . Malignant neoplasm of lower-inner quadrant of right breast of female, estrogen receptor positive 2012  . Pneumonia   . PONV (postoperative nausea and vomiting)   . Recurrent cancer of right breast 12/22/2013  . Thrombocytopenia 05/22/2013    MEDICATIONS: Current Outpatient Medications on File Prior to Visit  Medication Sig Dispense Refill  . aspirin-acetaminophen-caffeine (EXCEDRIN MIGRAINE) 250-250-65 MG tablet Take by mouth every 6 (six) hours as needed for headache.    . Biotin 5000 MCG CAPS Take 5,000 mcg by mouth daily.    Renard Hamper Pepper-Turmeric (TURMERIC CURCUMIN) 08-998 MG CAPS Take by mouth 2 (two) times daily.    . Cholecalciferol (VITAMIN D3) 125 MCG (5000 UT) TBDP Take 1 tablet by mouth daily.    . Coenzyme Q10 (COQ10) 150 MG CAPS Take 1 capsule by mouth once.    . finasteride (PROSCAR) 5 MG tablet Take 5 mg by mouth daily. 1/2 tablet daily    . Misc Natural Products (LUTEIN 20) CAPS Take by mouth daily.    . pantoprazole (PROTONIX) 40 MG tablet Take 1 tablet (40 mg total) by mouth daily. 30 tablet 11  . Prenat-Fe Poly-Methfol-FA-DHA (VITAFOL FE+) 90-0.6-0.4-200 MG CAPS Take 1 tablet by mouth daily. (Patient not taking: Reported on 11/26/2019) 30 capsule 11  . rizatriptan (MAXALT) 10 MG tablet Take 1 tablet earliest onset of migraine.  May repeat in 2 hours if needed.  Maximum 2 tablets in 24  hours. 10 tablet 5  . sucralfate (CARAFATE) 1 g tablet Take 1 tablet (1 g total) by mouth 4 (four) times daily -  with meals and at bedtime. 120 tablet 1  . topiramate (TOPAMAX) 25 MG tablet Take 1 tablet (25 mg total) by mouth at bedtime. 30 tablet 5   No current facility-administered medications on file prior to visit.    ALLERGIES: Allergies  Allergen Reactions  . Tussionex Pennkinetic Er [Hydrocod Polst-Cpm Polst Er] Other (See Comments)    hallucinations  . Codeine Other (See Comments)    unkown  . Morphine Rash  . Morphine And Related Anxiety    anxiety  . Penicillin G Rash  . Penicillins Rash    FAMILY HISTORY: Family History  Adopted: Yes  Problem Relation Age of Onset  . Heart attack Father   . Cancer Cousin 40       melanoma; mat first cousin, located on neck  . Colon cancer Mother 54  . Rectal cancer Mother   . Cancer Maternal Aunt 60       stomach and ovarian as separate primaries  . Cancer Maternal Uncle 65       lung; smoker  . Cancer Maternal Uncle 64  prostate  . Cancer Maternal Grandmother 6       enodometrial  . Cancer Maternal Aunt 11       breast  . Stomach cancer Maternal Aunt     SOCIAL HISTORY: Social History   Socioeconomic History  . Marital status: Married    Spouse name: Not on file  . Number of children: Not on file  . Years of education: 40  . Highest education level: GED or equivalent  Occupational History  . Not on file  Tobacco Use  . Smoking status: Former Smoker    Quit date: 07/20/1996    Years since quitting: 23.8  . Smokeless tobacco: Never Used  Vaping Use  . Vaping Use: Not on file  Substance and Sexual Activity  . Alcohol use: Not Currently    Alcohol/week: 0.0 standard drinks  . Drug use: No  . Sexual activity: Yes    Partners: Male  Other Topics Concern  . Not on file  Social History Narrative   She was adopted by her grandparents as a child.  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   She works in a Engineer, agricultural.right handed   Two story home    Drinks caffeine occasional   Social Determinants of Health   Financial Resource Strain: Not on file  Food Insecurity: Not on file  Transportation Needs: Not on file  Physical Activity: Not on file  Stress: Not on file  Social Connections: Not on file  Intimate Partner Violence: Not on file     Objective:  *** General: No acute distress.  Patient appears well-groomed.   Head:  Normocephalic/atraumatic Eyes:  Fundi examined but not visualized Neck: supple, no paraspinal tenderness, full range of motion Heart:  Regular rate and rhythm Lungs:  Clear to auscultation bilaterally Back: No paraspinal tenderness Neurological Exam: alert and oriented to person, place, and time. Attention span and concentration intact, recent and remote memory intact, fund of knowledge intact.  Speech fluent and not dysarthric, language intact.  CN II-XII intact. Bulk and tone normal, muscle strength 5/5 throughout.  Sensation to light touch, temperature and vibration intact.  Deep tendon reflexes 2+ throughout, toes downgoing.  Finger to nose and heel to shin testing intact.  Gait normal, Romberg negative.   Assessment/Plan:   1.  Migraine without aura, without status migrainosus, not intractable 2.  ***  1.  Migraine prevention:  *** 2.  Migraine rescue:  ***  Metta Clines, DO  CC: ***

## 2020-05-28 ENCOUNTER — Ambulatory Visit: Payer: 59 | Admitting: Neurology

## 2020-07-14 NOTE — Progress Notes (Deleted)
NEUROLOGY FOLLOW UP OFFICE NOTE  [name] [id number]  Assessment/Plan:   ***  Subjective:  [name] is a 50 year old right-handed female with history of breast cancer s/p bilateral mastectomies, radiation and chemotherapy who follows up for migraines.  UPDATE: Started topiramate in August. ***.  Rizatriptan *** Intensity:  *** Duration:  *** Frequency:  ***  MRI of brain with and without contrast on 12/22/2019 personally reviewd was unremarkable.  To evaluate memory deficits, she had neuropsychological evaluation on 01/20/2020 which was within normal limits.  Given her headaches, memory deficits and excessive daytime fatigue, I ordered a sleep study which was denied by her insurance and would not grant a Publishing rights manager.  Frequency of abortive medication: *** Current NSAIDS:  none Current analgesics:  Excedrin Migraine Current triptans:  rizatriptan 4m Current ergotamine:  none Current anti-emetic:  none Current muscle relaxants:  none Current anti-anxiolytic:  none Current sleep aide:  none Current Antihypertensive medications:  none Current Antidepressant medications:  none Current Anticonvulsant medications:  topiramate 226mat bedtime Current anti-CGRP:  none Current Vitamins/Herbal/Supplements:  Co-Q10 15063maily, D, biotin, turmeric curcumin Current Antihistamines/Decongestants:  none Other therapy:  Ice pack on back of neck and over eyes. Hormone/birth control:  Vitafol Fe  Caffeine:  No coffee. Mt Dew rarely Diet:  Drinks plenty of water.  Not a big eater.  Eats protein bars, fruits and vegetables.  Sometimes a doughnut. Exercise:  Active but not routine Mood:  Does not feel depressed but not as happy as she used to be. Other pain:  Low back pain Sleep hygiene:  She thinks she sleeps well.  Daytime fatigue.    HISTORY: She has had headaches since her early 40s83s  She has history of recurrent breast cancer (2012 and 2015) status post  bilateral mastectomies, radiating and chemotherapy.  She is currently in remission without evidence of disease.  She developed severe headaches during her treatment in 2015.  She has had headaches since then.  Often, she wakes up with pressure-like headaches (pulsating in temples).  Sometimes they may occur later in the day.  Sometimes they are moderate if they occur later in the day, but otherwise severe, either at base of her skull radiating up to the front and in the temples.  No neck pain.  She has associated photophobia, phonophobia, rarely nausea, no associated visual disturbance.  They last usually a couple of hours but may last 1/2 a day.  Severe headaches occur about 10 days a months.  She takes Excedrin daily, which helps.  No specific trigger.  She does report degenerative disc disease in her cervical spine.  Changing pillows ineffective.  MRI of brain with and without contrast on 03/29/2016 personally reviewed and was unremarkable.  CT soft tissue of neck from 12/29/15 showed C6-7 disc degeneration with severe left neural foraminal stenosis.  She has episodes of dizziness when she is bent over and then stands up.  She also reports short term memory problems.  She reports daytime fatigue.   Past NSAIDS:  Ibuprofen, naproxen Past analgesics:  Tylenol Past abortive triptans:  none Past abortive ergotamine:  none Past muscle relaxants:  none Past anti-emetic:  none Past antihypertensive medications:  none Past antidepressant medications:  none Past anticonvulsant medications:  none Past anti-CGRP:  none Past vitamins/Herbal/Supplements:  none Past antihistamines/decongestants:  none Other past therapies:  none  Reports difficulty with short-term memory.  Feels "foggy".  She is a "mouth breather".  TSH was  0.952.  PAST MEDICAL HISTORY: Past Medical History:  Diagnosis Date  . Abdominal pain, epigastric 11/20/2019  . Asthma    as a child  . Cervical dysplasia   . Gastroesophageal  reflux disease 11/20/2019  . Genetic testing 02/01/2018   Genetic testing was performed in 2015. The [employer] panel was ordered. In total, 23 genes were analyzed as part of this panel: ATM, BARD1, BRCA1, BRCA2, BRIP1, CDH1, CHEK2, EPCAM, MLH1, MRE11A, MSH2, MSH6, MUTYH, NBN, NF1, PALB2, PMS2, PTEN, RAD50, RAD51C, RAD51D, STK11, and TP53. No pathogenic variants were detected. Genetic testing did detect a Variant of Unknown Significance (VUS) at the t  . GERD (gastroesophageal reflux disease)    had during chemo  . Headache 06/12/2013  . History of chemotherapy   . History of kidney stones   . History of radiation therapy 11/24/13- 01/08/14   reconstructed right breast/chest wall 4500 cGy 25 sessions, electron beam boost 1440 cGy 8 sessions  . Liver masses 12/06/2016  . Major depressive disorder, in remission 03/12/2017  . Malignant neoplasm of lower-inner quadrant of right breast of female, estrogen receptor positive 2012  . Pneumonia   . PONV (postoperative nausea and vomiting)   . Recurrent cancer of right breast 12/22/2013  . Thrombocytopenia 05/22/2013    MEDICATIONS: Current Outpatient Medications on File Prior to Visit  Medication Sig Dispense Refill  . aspirin-acetaminophen-caffeine (EXCEDRIN MIGRAINE) 250-250-65 MG tablet Take by mouth every 6 (six) hours as needed for headache.    . Biotin 5000 MCG CAPS Take 5,000 mcg by mouth daily.    Renard Hamper Pepper-Turmeric (TURMERIC CURCUMIN) 08-998 MG CAPS Take by mouth 2 (two) times daily.    . Cholecalciferol (VITAMIN D3) 125 MCG (5000 UT) TBDP Take 1 tablet by mouth daily.    . Coenzyme Q10 (COQ10) 150 MG CAPS Take 1 capsule by mouth once.    . finasteride (PROSCAR) 5 MG tablet Take 5 mg by mouth daily. 1/2 tablet daily    . Misc Natural Products (LUTEIN 20) CAPS Take by mouth daily.    . pantoprazole (PROTONIX) 40 MG tablet Take 1 tablet (40 mg total) by mouth daily. 30 tablet 11  . Prenat-Fe Poly-Methfol-FA-DHA (VITAFOL FE+) 90-0.6-0.4-200  MG CAPS Take 1 tablet by mouth daily. (Patient not taking: Reported on 11/26/2019) 30 capsule 11  . rizatriptan (MAXALT) 10 MG tablet Take 1 tablet earliest onset of migraine.  May repeat in 2 hours if needed.  Maximum 2 tablets in 24 hours. 10 tablet 5  . sucralfate (CARAFATE) 1 g tablet Take 1 tablet (1 g total) by mouth 4 (four) times daily -  with meals and at bedtime. 120 tablet 1  . topiramate (TOPAMAX) 25 MG tablet Take 1 tablet (25 mg total) by mouth at bedtime. 30 tablet 5   No current facility-administered medications on file prior to visit.    ALLERGIES: Allergies  Allergen Reactions  . Tussionex Pennkinetic Er [Hydrocod Polst-Cpm Polst Er] Other (See Comments)    hallucinations  . Codeine Other (See Comments)    unkown  . Morphine Rash  . Morphine And Related Anxiety    anxiety  . Penicillin G Rash  . Penicillins Rash    FAMILY HISTORY: Family History  Adopted: Yes  Problem Relation Age of Onset  . Heart attack Father   . Cancer Cousin 40       melanoma; mat first cousin, located on neck  . Colon cancer Mother 42  . Rectal cancer Mother   .  Cancer Maternal Aunt 60       stomach and ovarian as separate primaries  . Cancer Maternal Uncle 65       lung; smoker  . Cancer Maternal Uncle 32       prostate  . Cancer Maternal Grandmother 75       enodometrial  . Cancer Maternal Aunt 55       breast  . Stomach cancer Maternal Aunt       Objective:  *** General: No acute distress.  Patient appears ***-groomed.   Head:  Normocephalic/atraumatic Eyes:  Fundi examined but not visualized Neck: supple, no paraspinal tenderness, full range of motion Heart:  Regular rate and rhythm Lungs:  Clear to auscultation bilaterally Back: No paraspinal tenderness Neurological Exam: alert and oriented to person, place, and time. Attention span and concentration intact, recent and remote memory intact, fund of knowledge intact.  Speech fluent and not dysarthric, language intact.   CN II-XII intact. Bulk and tone normal, muscle strength 5/5 throughout.  Sensation to light touch, temperature and vibration intact.  Deep tendon reflexes 2+ throughout, toes downgoing.  Finger to nose and heel to shin testing intact.  Gait normal, Romberg negative.     Metta Clines, DO  CC: ***

## 2020-07-15 ENCOUNTER — Ambulatory Visit: Payer: 59 | Admitting: Neurology

## 2020-07-20 ENCOUNTER — Other Ambulatory Visit: Payer: Self-pay

## 2020-07-20 ENCOUNTER — Ambulatory Visit (INDEPENDENT_AMBULATORY_CARE_PROVIDER_SITE_OTHER): Payer: 59 | Admitting: Neurology

## 2020-07-20 ENCOUNTER — Encounter: Payer: Self-pay | Admitting: Neurology

## 2020-07-20 VITALS — BP 98/67 | HR 15 | Resp 18 | Ht 63.5 in | Wt 125.0 lb

## 2020-07-20 DIAGNOSIS — R400 Somnolence: Secondary | ICD-10-CM | POA: Diagnosis not present

## 2020-07-20 DIAGNOSIS — G43709 Chronic migraine without aura, not intractable, without status migrainosus: Secondary | ICD-10-CM | POA: Diagnosis not present

## 2020-07-20 MED ORDER — VENLAFAXINE HCL ER 75 MG PO CP24
75.0000 mg | ORAL_CAPSULE | Freq: Every day | ORAL | 5 refills | Status: DC
Start: 2020-07-20 — End: 2020-10-08

## 2020-07-20 MED ORDER — RIZATRIPTAN BENZOATE 10 MG PO TABS: ORAL_TABLET | ORAL | 5 refills | Status: DC

## 2020-07-20 NOTE — Patient Instructions (Signed)
1.  Start venlafaxine XR 75mg  daily. 2.  Stop Excedrin 3.  Take rizatriptan 10mg  at earliest onset of migraine.  May repeat in 2 hours if needed.  Maximum 2 tablets in 24 hours 4.  Alternatively, take Ubrelvy 100mg  at earliest onset of headache (or if you wake up with migraine).  May repeat in 2 hours.  Maximum 2 tablets in 24 hours.  Contact me for prescription if effective 5. In addition to Co-Q-10, take magnesium citrate 400mg  daily and riboflavin (B2) 400mg  daily 6.  Follow up in 4 to 6 months.

## 2020-07-20 NOTE — Progress Notes (Signed)
NEUROLOGY FOLLOW UP OFFICE NOTE  [name] [id number]  Assessment/Plan:   Chronic migraine without aura complicated by medication overuse  1.  Migraine prevention:  Venlafaxine XR 81m daily 2.  Migraine rescue:  She will try samples of Ubrelvy 1048m  Otherwise, she may try rizatriptan 1074m.  Stop Excedrin 4.  Magnesium citrate 400m60mily, riboflavin 400mg72mly and Co-Q10 300mg 10my 5.  Limit use of pain relievers to no more than 2 days out of week to prevent risk of rebound or medication-overuse headache. 6.  Keep headache diary 7.  Follow up 4 to 6 months   Subjective:  Judith Baldassari49 yea29old right-handed female with history of breast cancer s/p bilateral mastectomies, radiation and chemotherapy who follows up for migraines and memory deficits.  UPDATE: MRI of brain with and without contrast on 12/22/2019 was normal. Neuropsychological testing in October 2021 demonstrated no cognitive deficits. She was referred for a sleep study but it was denied by her insurance.  She has since been referred to sleep medicine.    She decided not to start topiramate due to potential side effects.  She thinks the CoQ10 has been helping.  Still has a daily headache.  She has had 5 days of waking up with headache in a 30 day period. This past Sunday, she reports waking up with a different headache that was more severe and associated with dizziness.    Frequency of abortive medication: Excedrin Migraine daily Current NSAIDS:  none Current analgesics:  Excedrin Migraine Current triptans:  none Current ergotamine:  none Current anti-emetic:  none Current muscle relaxants:  none Current anti-anxiolytic:  none Current sleep aide:  none Current Antihypertensive medications:  none Current Antidepressant medications:  none Current Anticonvulsant medications:  none Current anti-CGRP:  none Current Vitamins/Herbal/Supplements:  Co-Q10 150mg d40m, D, biotin, turmeric  curcumin Current Antihistamines/Decongestants:  none Other therapy:  Ice pack on back of neck and over eyes. Hormone/birth control:  Vitafol Fe  Caffeine:  No coffee. Mt Dew rarely Diet:  Drinks plenty of water.  Not a big eater.  Eats protein bars, fruits and vegetables.  Sometimes a doughnut. Exercise:  Active but not routine Mood:  Does not feel depressed but not as happy as she used to be. Other pain:  Low back pain Sleep hygiene:  She thinks she sleeps well but has excessive daytime sleepiness.    HISTORY: She has had headaches since her early 40s.   65se has history of recurrent breast cancer (2012 and 2015) status post bilateral mastectomies, radiating and chemotherapy.  She is currently in remission without evidence of disease.  She developed severe headaches during her treatment in 2015.  She has had headaches since then.  Often, she wakes up with pressure-like headaches (pulsating in temples).  Sometimes they may occur later in the day.  Sometimes they are moderate if they occur later in the day, but otherwise severe, either at base of her skull radiating up to the front and in the temples.  No neck pain.  She has associated photophobia, phonophobia, rarely nausea, no associated visual disturbance.  They last usually a couple of hours but may last 1/2 a day.  Severe headaches occur about 10 days a months.  She takes Excedrin daily, which helps.  No specific trigger.  She does report degenerative disc disease in her cervical spine.  Changing pillows ineffective.  MRI of brain with and without contrast on 03/29/2016 personally reviewed and  was unremarkable.  CT soft tissue of neck from 12/29/15 showed C6-7 disc degeneration with severe left neural foraminal stenosis.  She has episodes of dizziness when she is bent over and then stands up.  She also reports short term memory problems.  She reports daytime fatigue.  Past NSAIDS:  Ibuprofen, naproxen Past analgesics:  Tylenol Past abortive  triptans:  none Past abortive ergotamine:  none Past muscle relaxants:  none Past anti-emetic:  none Past antihypertensive medications:  none Past antidepressant medications:  none Past anticonvulsant medications:  none Past anti-CGRP:  none Past vitamins/Herbal/Supplements:  none Past antihistamines/decongestants:  none Other past therapies:  none  Reports difficulty with short-term memory.  Feels "foggy".  She is a "mouth breather".  TSH was 0.952.  PAST MEDICAL HISTORY: Past Medical History:  Diagnosis Date  . Abdominal pain, epigastric 11/20/2019  . Asthma    as a child  . Cervical dysplasia   . Gastroesophageal reflux disease 11/20/2019  . Genetic testing 02/01/2018   Genetic testing was performed in 2015. The [employer] panel was ordered. In total, 23 genes were analyzed as part of this panel: ATM, BARD1, BRCA1, BRCA2, BRIP1, CDH1, CHEK2, EPCAM, MLH1, MRE11A, MSH2, MSH6, MUTYH, NBN, NF1, PALB2, PMS2, PTEN, RAD50, RAD51C, RAD51D, STK11, and TP53. No pathogenic variants were detected. Genetic testing did detect a Variant of Unknown Significance (VUS) at the t  . GERD (gastroesophageal reflux disease)    had during chemo  . Headache 06/12/2013  . History of chemotherapy   . History of kidney stones   . History of radiation therapy 11/24/13- 01/08/14   reconstructed right breast/chest wall 4500 cGy 25 sessions, electron beam boost 1440 cGy 8 sessions  . Liver masses 12/06/2016  . Major depressive disorder, in remission 03/12/2017  . Malignant neoplasm of lower-inner quadrant of right breast of female, estrogen receptor positive 2012  . Pneumonia   . PONV (postoperative nausea and vomiting)   . Recurrent cancer of right breast 12/22/2013  . Thrombocytopenia 05/22/2013    MEDICATIONS: Current Outpatient Medications on File Prior to Visit  Medication Sig Dispense Refill  . aspirin-acetaminophen-caffeine (EXCEDRIN MIGRAINE) 250-250-65 MG tablet Take by mouth every 6 (six) hours as  needed for headache.    . Biotin 5000 MCG CAPS Take 5,000 mcg by mouth daily.    Renard Hamper Pepper-Turmeric (TURMERIC CURCUMIN) 08-998 MG CAPS Take by mouth 2 (two) times daily.    . Cholecalciferol (VITAMIN D3) 125 MCG (5000 UT) TBDP Take 1 tablet by mouth daily.    . Coenzyme Q10 (COQ10) 150 MG CAPS Take 1 capsule by mouth once.    . finasteride (PROSCAR) 5 MG tablet Take 5 mg by mouth daily. 1/2 tablet daily    . Misc Natural Products (LUTEIN 20) CAPS Take by mouth daily.    . pantoprazole (PROTONIX) 40 MG tablet Take 1 tablet (40 mg total) by mouth daily. 30 tablet 11  . Prenat-Fe Poly-Methfol-FA-DHA (VITAFOL FE+) 90-0.6-0.4-200 MG CAPS Take 1 tablet by mouth daily. (Patient not taking: Reported on 11/26/2019) 30 capsule 11  . rizatriptan (MAXALT) 10 MG tablet Take 1 tablet earliest onset of migraine.  May repeat in 2 hours if needed.  Maximum 2 tablets in 24 hours. 10 tablet 5  . sucralfate (CARAFATE) 1 g tablet Take 1 tablet (1 g total) by mouth 4 (four) times daily -  with meals and at bedtime. 120 tablet 1  . topiramate (TOPAMAX) 25 MG tablet Take 1 tablet (25 mg total) by mouth  at bedtime. 30 tablet 5   No current facility-administered medications on file prior to visit.    ALLERGIES: Allergies  Allergen Reactions  . Tussionex Pennkinetic Er [Hydrocod Polst-Cpm Polst Er] Other (See Comments)    hallucinations  . Codeine Other (See Comments)    unkown  . Morphine Rash  . Morphine And Related Anxiety    anxiety  . Penicillin G Rash  . Penicillins Rash    FAMILY HISTORY: Family History  Adopted: Yes  Problem Relation Age of Onset  . Heart attack Father   . Cancer Cousin 40       melanoma; mat first cousin, located on neck  . Colon cancer Mother 56  . Rectal cancer Mother   . Cancer Maternal Aunt 60       stomach and ovarian as separate primaries  . Cancer Maternal Uncle 65       lung; smoker  . Cancer Maternal Uncle 51       prostate  . Cancer Maternal Grandmother 39        enodometrial  . Cancer Maternal Aunt 55       breast  . Stomach cancer Maternal Aunt       Objective:  Blood pressure 98/67, pulse (!) 15, resp. rate 18, height 5' 3.5" (1.613 m), weight 125 lb (56.7 kg), last menstrual period 03/11/2011, SpO2 98 %. General: No acute distress.  Patient appears well-groomed.        Metta Clines, DO

## 2020-07-21 ENCOUNTER — Telehealth: Payer: Self-pay | Admitting: Neurology

## 2020-07-21 NOTE — Telephone Encounter (Signed)
Per Dr.Jaffe note, She will try samples of Ubrelvy 100 mg.  Otherwise, she may try rizatriptan 10 mg.   So pt wanted to make sure she can take the two together. Per the warning signs she can't  take Venlafaxine and Rizatriptan.   Per pt had a PET Scan that showed mild atherosclerotic calcification of the aorta arch pt wanted to know if taking the Rizatriptan could or would make it worse.  Please advise.

## 2020-07-22 NOTE — Telephone Encounter (Signed)
Pt advised of Dr.JAffe note below.

## 2020-07-22 NOTE — Telephone Encounter (Signed)
She can take both Ubrelvy and rizatriptan together.  So if she takes rizatriptan first, she may use Ubrelvy as a second line (or vice versa).  Rizatriptan should still be okay given the PET scan findings.  It should be okay to take venlafaxine with rizatriptan.  Medications commonly have potential interactions but we weigh risks and benefits.  Antidepressants (such as venlafaxine) and triptans (such as rizatriptan) may have potential interactions (like a lot of medications) but are commonly used together (weighing risks and benefits).

## 2020-08-23 ENCOUNTER — Other Ambulatory Visit: Payer: Self-pay

## 2020-08-23 ENCOUNTER — Telehealth: Payer: Self-pay | Admitting: Adult Health

## 2020-08-23 ENCOUNTER — Encounter: Payer: Self-pay | Admitting: Adult Health

## 2020-08-23 DIAGNOSIS — C787 Secondary malignant neoplasm of liver and intrahepatic bile duct: Secondary | ICD-10-CM

## 2020-08-23 DIAGNOSIS — Z170 Estrogen receptor positive status [ER+]: Secondary | ICD-10-CM

## 2020-08-23 DIAGNOSIS — R5383 Other fatigue: Secondary | ICD-10-CM

## 2020-08-23 DIAGNOSIS — C50311 Malignant neoplasm of lower-inner quadrant of right female breast: Secondary | ICD-10-CM

## 2020-08-23 DIAGNOSIS — C50911 Malignant neoplasm of unspecified site of right female breast: Secondary | ICD-10-CM

## 2020-08-23 NOTE — Telephone Encounter (Signed)
R/s per los, pt aware

## 2020-08-24 ENCOUNTER — Inpatient Hospital Stay: Payer: 59 | Attending: Adult Health

## 2020-08-24 ENCOUNTER — Other Ambulatory Visit: Payer: Self-pay

## 2020-08-24 DIAGNOSIS — Z923 Personal history of irradiation: Secondary | ICD-10-CM | POA: Insufficient documentation

## 2020-08-24 DIAGNOSIS — C50911 Malignant neoplasm of unspecified site of right female breast: Secondary | ICD-10-CM | POA: Insufficient documentation

## 2020-08-24 DIAGNOSIS — C787 Secondary malignant neoplasm of liver and intrahepatic bile duct: Secondary | ICD-10-CM

## 2020-08-24 DIAGNOSIS — R5383 Other fatigue: Secondary | ICD-10-CM | POA: Insufficient documentation

## 2020-08-24 DIAGNOSIS — C50311 Malignant neoplasm of lower-inner quadrant of right female breast: Secondary | ICD-10-CM

## 2020-08-24 DIAGNOSIS — Z170 Estrogen receptor positive status [ER+]: Secondary | ICD-10-CM

## 2020-08-24 LAB — CMP (CANCER CENTER ONLY)
ALT: 14 U/L (ref 0–44)
AST: 21 U/L (ref 15–41)
Albumin: 4.4 g/dL (ref 3.5–5.0)
Alkaline Phosphatase: 96 U/L (ref 38–126)
Anion gap: 9 (ref 5–15)
BUN: 16 mg/dL (ref 6–20)
CO2: 28 mmol/L (ref 22–32)
Calcium: 9.8 mg/dL (ref 8.9–10.3)
Chloride: 103 mmol/L (ref 98–111)
Creatinine: 0.89 mg/dL (ref 0.44–1.00)
GFR, Estimated: >60 mL/min (ref >60)
Glucose, Bld: 69 mg/dL — ABNORMAL LOW (ref 70–99)
Potassium: 4.1 mmol/L (ref 3.5–5.1)
Sodium: 140 mmol/L (ref 135–145)
Total Bilirubin: 0.8 mg/dL (ref 0.3–1.2)
Total Protein: 7 g/dL (ref 6.5–8.1)

## 2020-08-24 LAB — CBC WITH DIFFERENTIAL (CANCER CENTER ONLY)
Abs Immature Granulocytes: 0.02 10*3/uL (ref 0.00–0.07)
Basophils Absolute: 0 10*3/uL (ref 0.0–0.1)
Basophils Relative: 0 %
Eosinophils Absolute: 0.1 10*3/uL (ref 0.0–0.5)
Eosinophils Relative: 1 %
HCT: 39.8 % (ref 36.0–46.0)
Hemoglobin: 13.2 g/dL (ref 12.0–15.0)
Immature Granulocytes: 0 %
Lymphocytes Relative: 23 %
Lymphs Abs: 1.6 10*3/uL (ref 0.7–4.0)
MCH: 30.2 pg (ref 26.0–34.0)
MCHC: 33.2 g/dL (ref 30.0–36.0)
MCV: 91.1 fL (ref 80.0–100.0)
Monocytes Absolute: 0.4 10*3/uL (ref 0.1–1.0)
Monocytes Relative: 6 %
Neutro Abs: 4.7 10*3/uL (ref 1.7–7.7)
Neutrophils Relative %: 70 %
Platelet Count: 250 10*3/uL (ref 150–400)
RBC: 4.37 MIL/uL (ref 3.87–5.11)
RDW: 12 % (ref 11.5–15.5)
WBC Count: 6.8 10*3/uL (ref 4.0–10.5)
nRBC: 0 % (ref 0.0–0.2)

## 2020-08-24 LAB — IRON AND TIBC
Iron: 132 ug/dL (ref 41–142)
Saturation Ratios: 42 % (ref 21–57)
TIBC: 310 ug/dL (ref 236–444)
UIBC: 179 ug/dL (ref 120–384)

## 2020-08-24 LAB — FERRITIN: Ferritin: 90 ng/mL (ref 11–307)

## 2020-08-24 LAB — VITAMIN D 25 HYDROXY (VIT D DEFICIENCY, FRACTURES): Vit D, 25-Hydroxy: 116.47 ng/mL — ABNORMAL HIGH (ref 30–100)

## 2020-08-24 LAB — TSH: TSH: 1.113 u[IU]/mL (ref 0.308–3.960)

## 2020-08-25 LAB — CANCER ANTIGEN 27.29: CA 27.29: 26.5 U/mL (ref 0.0–38.6)

## 2020-08-25 LAB — ESTRADIOL: Estradiol: <5 pg/mL

## 2020-08-25 LAB — FOLLICLE STIMULATING HORMONE: FSH: 92.6 m[IU]/mL

## 2020-08-25 LAB — TESTOSTERONE: Testosterone: <3 ng/dL — ABNORMAL LOW (ref 4–50)

## 2020-08-27 ENCOUNTER — Telehealth: Payer: Self-pay

## 2020-08-27 ENCOUNTER — Other Ambulatory Visit: Payer: Self-pay

## 2020-08-27 DIAGNOSIS — C50311 Malignant neoplasm of lower-inner quadrant of right female breast: Secondary | ICD-10-CM

## 2020-08-27 DIAGNOSIS — Z170 Estrogen receptor positive status [ER+]: Secondary | ICD-10-CM

## 2020-08-28 LAB — DHEA-SULFATE: DHEA-SO4: 44.8 ug/dL (ref 41.2–243.7)

## 2020-08-31 ENCOUNTER — Encounter: Payer: 59 | Admitting: Adult Health

## 2020-08-31 NOTE — Telephone Encounter (Signed)
Late note:  Pt called in to inform us she had not received results for DHEA which was ordered with other labs. Lab order was cancelled. Re-entered and lab states they can run it. Pt informed and verbalized thanks and understanding.

## 2020-09-02 ENCOUNTER — Other Ambulatory Visit: Payer: Self-pay

## 2020-09-02 ENCOUNTER — Inpatient Hospital Stay: Payer: 59 | Admitting: Adult Health

## 2020-09-03 LAB — ESTRADIOL, ULTRA SENS: Estradiol, Sensitive: <2.5 pg/mL

## 2020-09-23 ENCOUNTER — Telehealth: Payer: Self-pay | Admitting: Adult Health

## 2020-09-23 NOTE — Telephone Encounter (Signed)
Pt called in to r/s appt. Appt r/s and pt aware.

## 2020-09-24 ENCOUNTER — Encounter: Payer: 59 | Admitting: Adult Health

## 2020-09-24 ENCOUNTER — Inpatient Hospital Stay: Payer: 59 | Admitting: Adult Health

## 2020-10-08 ENCOUNTER — Inpatient Hospital Stay: Payer: 59 | Attending: Adult Health | Admitting: Adult Health

## 2020-10-08 ENCOUNTER — Other Ambulatory Visit: Payer: Self-pay

## 2020-10-08 ENCOUNTER — Encounter: Payer: Self-pay | Admitting: Adult Health

## 2020-10-08 VITALS — BP 90/67 | HR 73 | Temp 97.6°F | Resp 18 | Ht 63.5 in | Wt 129.5 lb

## 2020-10-08 DIAGNOSIS — I700 Atherosclerosis of aorta: Secondary | ICD-10-CM | POA: Diagnosis not present

## 2020-10-08 DIAGNOSIS — Z853 Personal history of malignant neoplasm of breast: Secondary | ICD-10-CM | POA: Insufficient documentation

## 2020-10-08 NOTE — Progress Notes (Signed)
CLINIC:  Survivorship   REASON FOR VISIT:  Routine follow-up for history of breast cancer.   BRIEF ONCOLOGIC HISTORY:   50 y.o.  BRCA 1-2 negative [name], [name] woman,    (1) status post right breast lower inner quadrant biopsy in September 2012 for a clinically 2.0 cm invasive ductal carcinoma, with some enlarged Right axillary lymph nodes, the largest one of which was negative by biopsy. The tumor was grade 2, estrogen receptor positive at 88%, progesterone receptor positive at 11%, HER-2/neu positive by CISH with a ratio of 4.61, and a proliferation marker of 36%.    (2) treated in the neoadjuvant setting with Q 3 week docetaxel/ carboplatin/ trastuzumab x6, completed 05/18/2011.  Continued on trastuzumab every 3 weeks to complete one year, last dose in November 2013.   (3) s/p bilateral mastectomies with sentinel node biopsy 06/29/2011 for a residual right-sided ypT1c ypN0 invasive ductal carcinoma, grade 2, with immediate expander placement   (4) on tamoxifen as of May 2013, discontinued in early 2015 due to breast cancer recurrence    RECURRENT BREAST CANCER January 2015 (5) status post right lumpectomy with right-sided sentinel lymph node sampling 05/05/2013 for a pT1b cN0, stage IA invasive ductal carcinoma, estrogen receptor 94% positive, progesterone receptor 11% positive, with an MIB-1 of 19% and HER-2 amplification   (6)  Received TDM-1 every 3 weeks x 8 doses completed 10/09/2013; echo 08/25/2013 showed a well preserved ejection fraction   (7) adjuvant radiation therapy completed 01/08/2014   (8) started anastrozole 04/10/2014              (a) [place] and estradiol in menopausal range 02/04/2014, to be followed every 3 months             (b) bone density 01/15/2013 showed a T score of -1.1             (c) switched to letrozole 07/07/2014, discontinued June 2016 because of arthralgias/myalgias             (d) started exemestane September 2016, stopped March 2017 because of  cost issues             (e) bone density 06/17/2015 showed a T score of -1.5              (f) anastrozole resumed April 2017, discontinued by the patient sometime around April 2018   (9) genetic testing (23 genes associated with hereditary ovarian cancer through [facility], April 2015) 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  There were no deleterious mutations noted    INTERVAL HISTORY:  [name] presents to the [facility] today for routine follow-up for her history of breast cancer.  She is doing moderately well today.  She notes that she has episodic periods of feeling pooly.  She says she will feel nauseated and light headed and is worried that something else could be going on.  She had a CT chest last year that showed some indeterminate liver lesions followed by an mri of the liver that did not give any definitive information about the liver lesions.  The radiologist had recommended serially following these initially, however after our discussion,he placed an addendum recommending a PET scan since the lesions were indeterminate.  The PET scan did not reveal any hypermetabolic liver lesions.   [name] has not yet established with a  PCP.  We drew several labs with this visit and they were all normal, and didn't reveal  anything concerning.  She has aortic atherosclerosis and a family history of heart disease.  She wants a referral to cardiology to review her scan and perhaps evaluate whether there is a cardiologic etiology to her intermittent poor feelings.    She also notes that she saw Dr. Tomi Likens for her headaches.  She is having stomach issues from taking excedrine for her migraines, but notes that she doesn't want to be on the omeprazole.  She was prescribed topamax, however isn't sure if she wants to take it because of the possible side effects.    She asked questions about her vitamins as well today.    REVIEW OF SYSTEMS:  Review of  Systems  Constitutional:  Positive for fatigue. Negative for appetite change, chills, fever and unexpected weight change.  HENT:   Negative for hearing loss and lump/mass.   Eyes:  Negative for eye problems and icterus.  Respiratory:  Negative for chest tightness and cough.   Cardiovascular:  Negative for chest pain, leg swelling and palpitations.  Gastrointestinal:  Negative for abdominal distention, abdominal pain, constipation, diarrhea, nausea and vomiting.  Endocrine: Negative for hot flashes.  Musculoskeletal:  Negative for back pain.  Skin:  Negative for itching and rash.  Neurological:  Positive for headaches. Negative for dizziness, extremity weakness, numbness and speech difficulty.  Hematological:  Negative for adenopathy. Does not bruise/bleed easily.  Psychiatric/Behavioral:  Negative for depression. The patient is nervous/anxious.   Breast: Denies any new nodularity, masses, tenderness, nipple changes, or nipple discharge.       PAST MEDICAL/SURGICAL HISTORY:  Past Medical History:  Diagnosis Date   Abdominal pain, epigastric 11/20/2019   Asthma    as a child   Cervical dysplasia    Gastroesophageal reflux disease 11/20/2019   Genetic testing 02/01/2018   Genetic testing was performed in 2015. The [employer] panel was ordered. In total, 23 genes were analyzed as part of this panel: ATM, BARD1, BRCA1, BRCA2, BRIP1, CDH1, CHEK2, EPCAM, MLH1, MRE11A, MSH2, MSH6, MUTYH, NBN, NF1, PALB2, PMS2, PTEN, RAD50, RAD51C, RAD51D, STK11, and TP53. No pathogenic variants were detected. Genetic testing did detect a Variant of Unknown Significance (VUS) at the t   GERD (gastroesophageal reflux disease)    had during chemo   Headache 06/12/2013   History of chemotherapy    History of kidney stones    History of radiation therapy 11/24/13- 01/08/14   reconstructed right breast/chest wall 4500 cGy 25 sessions, electron beam boost 1440 cGy 8 sessions   Liver masses 12/06/2016   Major  depressive disorder, in remission 03/12/2017   Malignant neoplasm of lower-inner quadrant of right breast of female, estrogen receptor positive 2012   Pneumonia    PONV (postoperative nausea and vomiting)    Recurrent cancer of right breast 12/22/2013   Thrombocytopenia 05/22/2013   Past Surgical History:  Procedure Laterality Date   ADENOIDECTOMY     BREAST LUMPECTOMY WITH NEEDLE LOCALIZATION Right 05/05/2013   Procedure: EXCISION RECURRENT CANCER RIGHT BREAST WITH NEEDLE LOCALOZATION;  Surgeon: Adin Hector, MD;  Location: [place];  Service: General;  Laterality: Right;   BREAST RECONSTRUCTION  06/29/2011   Procedure: BREAST RECONSTRUCTION;  Surgeon: Theodoro Kos, DO;  Location: [place];  Service: Plastics;  Laterality: Bilateral;  Immediate Bilateral Breast Reconstruction with Bilateral Tissue Expanders and placement of  Alloderm    DILATION AND CURETTAGE OF UTERUS     fibroid tumor surgery     lazy eye     corrective surgery  MASTECTOMY W/ SENTINEL NODE BIOPSY  06/29/2011   Procedure: MASTECTOMY WITH SENTINEL LYMPH NODE BIOPSY;  Surgeon: Adin Hector, MD;  Location: [place];  Service: General;  Laterality: Right;  right skin spairing mstectomy and right sentinel lymph node biopsy   PORT-A-CATH REMOVAL  03/12/2012   Procedure: MINOR REMOVAL PORT-A-CATH;  Surgeon: Adin Hector, MD;  Location: [place];  Service: General;  Laterality: N/A;  Upper left   PORTACATH PLACEMENT Left 05/05/2013   Procedure: INSERTION PORT-A-CATH;  Surgeon: Adin Hector, MD;  Location: [place];  Service: General;  Laterality: Left;   RHINOPLASTY     uterine cervical treatments  2002   for precancerous lesions     ALLERGIES:  Allergies  Allergen Reactions   Tussionex Pennkinetic Er [Hydrocod Polst-Cpm Polst Er] Other (See Comments)    hallucinations   Codeine Other (See Comments)    unkown   Morphine Rash   Morphine And Related Anxiety    anxiety   Penicillin G Rash   Penicillins  Rash     CURRENT MEDICATIONS:  Outpatient Encounter Medications as of 10/08/2020  Medication Sig   aspirin-acetaminophen-caffeine (EXCEDRIN MIGRAINE) 250-250-65 MG tablet Take by mouth every 6 (six) hours as needed for headache.   Biotin 5000 MCG CAPS Take 5,000 mcg by mouth daily.   Black Pepper-Turmeric (TURMERIC CURCUMIN) 08-998 MG CAPS Take by mouth 2 (two) times daily.   Cholecalciferol (VITAMIN D3) 125 MCG (5000 UT) TBDP Take 1 tablet by mouth daily.   Coenzyme Q10 (COQ10) 150 MG CAPS Take 1 capsule by mouth once.   finasteride (PROSCAR) 5 MG tablet Take 5 mg by mouth daily. 1/2 tablet daily   pantoprazole (PROTONIX) 40 MG tablet Take 1 tablet (40 mg total) by mouth daily. (Patient not taking: Reported on 07/20/2020)   Prenat-Fe Poly-Methfol-FA-DHA (VITAFOL FE+) 90-0.6-0.4-200 MG CAPS Take 1 tablet by mouth daily.   sucralfate (CARAFATE) 1 g tablet Take 1 tablet (1 g total) by mouth 4 (four) times daily -  with meals and at bedtime.   [DISCONTINUED] Misc Natural Products (LUTEIN 20) CAPS Take by mouth daily.   [DISCONTINUED] rizatriptan (MAXALT) 10 MG tablet Take 1 tablet earliest onset of migraine.  May repeat in 2 hours if needed.  Maximum 2 tablets in 24 hours.   [DISCONTINUED] topiramate (TOPAMAX) 25 MG tablet Take 1 tablet (25 mg total) by mouth at bedtime. (Patient not taking: Reported on 07/20/2020)   [DISCONTINUED] venlafaxine XR (EFFEXOR XR) 75 MG 24 hr capsule Take 1 capsule (75 mg total) by mouth daily with breakfast.   No facility-administered encounter medications on file as of 10/08/2020.     ONCOLOGIC FAMILY HISTORY:  Family History  Adopted: Yes  Problem Relation Age of Onset   Heart attack Father    Cancer Cousin 18       melanoma; mat first cousin, located on neck   Colon cancer Mother 62   Rectal cancer Mother    Cancer Maternal Aunt 60       stomach and ovarian as separate primaries   Cancer Maternal Uncle 77       lung; smoker   Cancer Maternal Uncle 63        prostate   Cancer Maternal Grandmother 75       enodometrial   Cancer Maternal Aunt 55       breast   Stomach cancer Maternal Aunt     GENETIC COUNSELING/TESTING:negative  SOCIAL HISTORY:  Social History  Socioeconomic History   Marital status: Married    Spouse name: Not on file   Number of children: Not on file   Years of education: 10   Highest education level: GED or equivalent  Occupational History   Not on file  Tobacco Use   Smoking status: Former    Pack years: 0.00    Types: Cigarettes    Quit date: 07/20/1996    Years since quitting: 24.2   Smokeless tobacco: Never  Vaping Use   Vaping Use: Not on file  Substance and Sexual Activity   Alcohol use: Not Currently    Alcohol/week: 0.0 standard drinks   Drug use: No   Sexual activity: Yes    Partners: Male  Other Topics Concern   Not on file  Social History Narrative   She was adopted by her grandparents as a child.  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  She works in a Engineer, agricultural.right handed   Two story home    Drinks caffeine occasional   Right handed   Social Determinants of Health   Financial Resource Strain: Not on file  Food Insecurity: Not on file  Transportation Needs: Not on file  Physical Activity: Not on file  Stress: Not on file  Social Connections: Not on file  Intimate Partner Violence: Not on file      PHYSICAL EXAMINATION:  Vital Signs: Vitals:   10/08/20 1417  BP: 90/67  Pulse: 73  Resp: 18  Temp: 97.6 F (36.4 C)  SpO2: 100%   Filed Weights   10/08/20 1417  Weight: 129 lb 8 oz (58.7 kg)   General: Well-nourished, well-appearing female in no acute distress.  Unaccompanied today.   HEENT: Head is normocephalic.  Pupils equal and reactive to light. Conjunctivae clear without exudate.  Sclerae anicteric. Oral mucosa is pink, moist.  Oropharynx is pink without lesions or erythema.  Lymph: No cervical, supraclavicular, or infraclavicular  lymphadenopathy noted on palpation.  Cardiovascular: Regular rate and rhythm.[name] Respiratory: Clear to auscultation bilaterally. Chest expansion symmetric; breathing non-labored.  Breast Exam:  S/p bilateral mastectomies and reconstruction, no sign of local recurrence -Axilla: No axillary adenopathy bilaterally.  GI: Abdomen soft and round; non-tender, non-distended. Bowel sounds normoactive. No hepatosplenomegaly.   GU: Deferred.  Neuro: No focal deficits. Steady gait.  Psych: Mood and affect normal and appropriate for situation.  MSK: No focal spinal tenderness to palpation, full range of motion in bilateral upper extremities Extremities: No edema. Skin: Warm and dry.  LABORATORY DATA:  No visits with results within 1 Day(s) from this visit.  Latest known visit with results is:  Orders Only on 08/27/2020  Component Date Value Ref Range Status   DHEA-SO4 08/24/2020 44.8  41.2 - 243.7 ug/dL Final   Comment: (NOTE) Performed At: [facility] [place], [place] [id number] Rush Farmer MD UM:[id number]      DIAGNOSTIC IMAGING:    CLINICAL DATA:  Subsequent treatment strategy for breast cancer with liver lesions on CT.   EXAM: NUCLEAR MEDICINE PET SKULL BASE TO THIGH   TECHNIQUE: 6.3 mCi F-18 FDG was injected intravenously. Full-ring PET imaging was performed from the skull base to thigh after the radiotracer. CT data was obtained and used for attenuation correction and anatomic localization.   Fasting blood glucose: 81 mg/dl   COMPARISON:  MRI abdomen dated 08/26/2019. CT chest dated 08/11/2019.   FINDINGS: Mediastinal blood pool activity: SUV max 2.3   Liver activity:  SUV max NA   NECK: No hypermetabolic cervical lymphadenopathy.   Incidental CT findings: none   CHEST: Status post bilateral mastectomy with reconstruction.   Status post left axillary lymph node dissection. No hypermetabolic thoracic lymphadenopathy.   No suspicious  pulmonary nodules.   Incidental CT findings: Mild atherosclerotic calcifications of the aortic arch.   ABDOMEN/PELVIS: No hypermetabolic lesions in the liver, including specifically in the posterior right hepatic dome and lateral segment left hepatic lobe, to correspond to the lesions on prior CT chest.   No abnormal hypermetabolism in the spleen, pancreas, or adrenal glands.   No hypermetabolic abdominopelvic lymphadenopathy.   Incidental CT findings: 6 mm nonobstructing left lower pole renal calculus. No hydronephrosis.   SKELETON: No focal hypermetabolic activity to suggest skeletal metastasis.   Incidental CT findings: none   IMPRESSION: Status post bilateral mastectomy with left axillary lymph node dissection.   No findings suspicious for recurrent or metastatic disease.   Specifically, no hypermetabolic liver lesions on PET.     Electronically Signed   By: Julian Hy M.D.   On: 09/24/2019 14:16      CLINICAL DATA:  Chronic headache   EXAM: MRI HEAD WITHOUT AND WITH CONTRAST   TECHNIQUE: Multiplanar, multiecho pulse sequences of the brain and surrounding structures were obtained without and with intravenous contrast.   CONTRAST:  23m MULTIHANCE GADOBENATE DIMEGLUMINE 529 MG/ML IV SOLN   COMPARISON:  03/29/2016 MRI head and prior.   FINDINGS: Brain: No diffusion-weighted signal abnormality. No intracranial hemorrhage. No significant white matter disease. No midline shift, ventriculomegaly or extra-axial fluid collection. No mass lesion. No abnormal enhancement.   Vascular: Normal flow voids.   Skull and upper cervical spine: Normal marrow signal.   Sinuses/Orbits: Normal orbits. Clear paranasal sinuses. No mastoid effusion.   Other: None.   IMPRESSION: No evidence of intracranial pathology.   No abnormal enhancement.     Electronically Signed   By: CPrimitivo GauzeM.D.   On: 12/22/2019 14:39 ASSESSMENT AND PLAN:  [name] is a pleasant 50y.o. female with history of recurrent breast cancer, ER+/PR+/HER2+, diagnosed initially in 2013 treated with bilateral mastectomies, neoadjuvant chemotherapy, maintenance trastuzumab, and tamoxifen, with recurrence in 2015 followed by right lumpectomy , TDM-1 x 8 doses, adjuvant radiation therapy, and anti estrogen therapy x 3 years with aromatase inhibitors.    1. History of breast cancer:  Ms. ETilsonis currently clinically and radiographically without evidence of disease or recurrence of breast cancer.   She will return in one year for LTS follow up.  I encouraged her to call me with any questions or concerns before her next visit at the cancer center, and I would be happy to see her sooner, if needed.    2. Previous liver lesion: PET scan ruled out cancer as an etiology.  Her most recent liver enzymes were normal.  These areas could have been cysts that resolved.     3. Aortic atherosclerosis: Noted on CT chest.  She wants to see cardiology.  I placed a referral.      4. Recurrent headaches: She is seeing Dr. JTomi Likens  I recommended that she consider taking the topamax.  She is having increased side effects from the excedrine.  I suggested that the topamax side effects don't necessarily mean that she will get them, and that she will be taking something that is regulated by the FDA, where supplements are not regulated.     5. Supplements: We reviewed her supplements again  in detail.  Her vitamin d level is slightly elevated.  I suggested she decrease her intake to only taking vitamin d3 5000 units daily instead of occasionally two and three times a day.   7. Cancer screening:  Due to Ms. Hazelrigg's history and her age, she should receive screening for skin cancers, colon cancer, and gynecologic cancers. She was encouraged to follow-up with her PCP for appropriate cancer screenings.   8. Health maintenance and wellness promotion: Ms. Dunton was encouraged to consume 5-7 servings of  fruits and vegetables per day. She was also encouraged to engage in moderate to vigorous exercise for 30 minutes per day most days of the week. She was instructed to limit her alcohol consumption and continue to abstain from tobacco use.     Dispo:  -Return to cancer center in one year for LTS follow up -Cardiology referral -I recommended her to see a PCP to stay up to date with other cancer screenings   Total encounter time: 60 minutes* in chart review, lab review, face to face visit time, order entry and documentation of the encounter.    Wilber Bihari, NP 10/08/20 8:44 PM Medical Oncology and Hematology [facility] [place], [place] [address] Tel. [phone]    Fax. [phone]  *Total Encounter Time as defined by the Centers for Medicare and Medicaid Services includes, in addition to the face-to-face time of a patient visit (documented in the note above) non-face-to-face time: obtaining and reviewing outside history, ordering and reviewing medications, tests or procedures, care coordination (communications with other health care professionals or caregivers) and documentation in the medical record.

## 2020-10-13 ENCOUNTER — Telehealth: Payer: Self-pay | Admitting: Adult Health

## 2020-10-13 NOTE — Telephone Encounter (Signed)
Sch per 07/1, pt aware

## 2020-12-07 ENCOUNTER — Encounter (HOSPITAL_BASED_OUTPATIENT_CLINIC_OR_DEPARTMENT_OTHER): Payer: Self-pay | Admitting: Cardiology

## 2020-12-07 ENCOUNTER — Other Ambulatory Visit: Payer: Self-pay

## 2020-12-07 ENCOUNTER — Ambulatory Visit (INDEPENDENT_AMBULATORY_CARE_PROVIDER_SITE_OTHER): Payer: 59 | Admitting: Cardiology

## 2020-12-07 VITALS — BP 92/64 | HR 70 | Ht 63.5 in | Wt 132.0 lb

## 2020-12-07 DIAGNOSIS — R42 Dizziness and giddiness: Secondary | ICD-10-CM

## 2020-12-07 DIAGNOSIS — Z9221 Personal history of antineoplastic chemotherapy: Secondary | ICD-10-CM | POA: Diagnosis not present

## 2020-12-07 DIAGNOSIS — R5382 Chronic fatigue, unspecified: Secondary | ICD-10-CM

## 2020-12-07 DIAGNOSIS — Z7189 Other specified counseling: Secondary | ICD-10-CM

## 2020-12-07 DIAGNOSIS — I700 Atherosclerosis of aorta: Secondary | ICD-10-CM | POA: Diagnosis not present

## 2020-12-07 DIAGNOSIS — Z8249 Family history of ischemic heart disease and other diseases of the circulatory system: Secondary | ICD-10-CM

## 2020-12-07 NOTE — Progress Notes (Addendum)
Cardiology Office Note:    Date:  12/07/2020   ID:  LULU HIRSCHMANN, DOB [birth year], MRN [id number]  PCP:  Patient, No Pcp Per (Inactive)  Cardiologist:  Buford Dresser, MD  Referring MD: Wilber Bihari Cornett*   CC: new patient evaluation for aortic atherosclerosis and CV risk   History of Present Illness:    [name] is a 50 y.o. female with a hx of recurrent breast [name], [name]* for the evaluation and management of abnormal CT, family history of heart disease.  Note from 10/08/20 reviewed. Had aortic atherosclerosis on CT, has family history of heart disease. Requested referral to cardiology for further evaluation.  Today: Has several concerns today. Chronic dizziness, near syncope, for many years. Asking for recommendations for PCP that specializes in menopause. Has scattered fatty regions across her body. Has chronic ankle edema, mild. Has stinging on the left leg/left foot. Has had back pain radiating to her legs. Has aortic atherosclerosis on her imaging.  Cardiovascular risk factors: Prior clinical ASCVD: none Comorbid conditions, including hypertension, hyperlipidemia, diabetes, chronic kidney disease: none Metabolic syndrome/Obesity: none Chronic inflammatory conditions: none Tobacco use history: never Family history: biological father had heart attack age 64, paternal grandfather died age 56 from a heart condition.  Prior cardiac testing and/or incidental findings on other testing (ie coronary calcium): has history of echocardiograms due to chemotherapy/herceptin, last 2015. Did not have done during her second incidence of breast [name]/chemotherapy  We discussed her CT scan at length, recommendations, and guidelines.  Discussed concerns at length today, see below. Unfortunately many of the concerns today are out of my area of expertise. She does not have a  PCP yet.  Denies chest pain, shortness of breath at rest or with normal exertion. No PND, orthopnea, or unexpected weight gain. No syncope or palpitations. Does have LE edema that is nonpitting chronically.  Past Medical History:  Diagnosis Date   Abdominal pain, epigastric 11/20/2019   Asthma    as a child   Cervical dysplasia    Gastroesophageal reflux disease 11/20/2019   Genetic testing 02/01/2018   Genetic testing was performed in 2015. The [employer] panel was ordered. In total, 23 genes were analyzed as part of this panel: ATM, BARD1, BRCA1, BRCA2, BRIP1, CDH1, CHEK2, EPCAM, MLH1, MRE11A, MSH2, MSH6, MUTYH, NBN, NF1, PALB2, PMS2, PTEN, RAD50, RAD51C, RAD51D, STK11, and TP53. No pathogenic variants were detected. Genetic testing did detect a Variant of Unknown Significance (VUS) at the t   GERD (gastroesophageal reflux disease)    had during chemo   Headache 06/12/2013   History of chemotherapy    History of kidney stones    History of radiation therapy 11/24/13- 01/08/14   reconstructed right breast/chest wall 4500 cGy 25 sessions, electron beam boost 1440 cGy 8 sessions   Liver masses 12/06/2016   Major depressive disorder, in remission 03/12/2017   Malignant neoplasm of lower-inner quadrant of right breast of female, estrogen receptor positive 2012   Pneumonia    PONV (postoperative nausea and vomiting)    Recurrent [name] of right breast 12/22/2013   Thrombocytopenia 05/22/2013    Past Surgical History:  Procedure Laterality Date   ADENOIDECTOMY     BREAST LUMPECTOMY WITH NEEDLE LOCALIZATION Right 05/05/2013   Procedure: EXCISION RECURRENT [name] RIGHT BREAST WITH NEEDLE LOCALOZATION;  Surgeon: Adin Hector, MD;  Location: [place];  Service: General;  Laterality:  Right;   BREAST RECONSTRUCTION  06/29/2011   Procedure: BREAST RECONSTRUCTION;  Surgeon: Theodoro Kos, DO;  Location: [place];  Service: Plastics;  Laterality: Bilateral;  Immediate Bilateral Breast Reconstruction with  Bilateral Tissue Expanders and placement of  Alloderm    DILATION AND CURETTAGE OF UTERUS     fibroid tumor surgery     lazy eye     corrective surgery   MASTECTOMY W/ SENTINEL NODE BIOPSY  06/29/2011   Procedure: MASTECTOMY WITH SENTINEL LYMPH NODE BIOPSY;  Surgeon: Adin Hector, MD;  Location: [place];  Service: General;  Laterality: Right;  right skin spairing mstectomy and right sentinel lymph node biopsy   PORT-A-CATH REMOVAL  03/12/2012   Procedure: MINOR REMOVAL PORT-A-CATH;  Surgeon: Adin Hector, MD;  Location: [place];  Service: General;  Laterality: N/A;  Upper left   PORTACATH PLACEMENT Left 05/05/2013   Procedure: INSERTION PORT-A-CATH;  Surgeon: Adin Hector, MD;  Location: [place] [facility];  Service: General;  Laterality: Left;   RHINOPLASTY     uterine cervical treatments  2002   for precancerous lesions    Current Medications: Current Outpatient Medications on File Prior to Visit  Medication Sig   aspirin-acetaminophen-caffeine (EXCEDRIN MIGRAINE) 250-250-65 MG tablet Take by mouth every 6 (six) hours as needed for headache.   Biotin 5000 MCG CAPS Take 5,000 mcg by mouth daily.   Black Pepper-Turmeric (TURMERIC CURCUMIN) 08-998 MG CAPS Take by mouth 2 (two) times daily.   Cholecalciferol (VITAMIN D3) 125 MCG (5000 UT) TBDP Take 1 tablet by mouth daily.   Coenzyme Q10 (COQ10) 150 MG CAPS Take 1 capsule by mouth once.   finasteride (PROSCAR) 5 MG tablet Take 5 mg by mouth daily. 1/2 tablet daily   pantoprazole (PROTONIX) 40 MG tablet Take 1 tablet (40 mg total) by mouth daily.   Prenat-Fe Poly-Methfol-FA-DHA (VITAFOL FE+) 90-0.6-0.4-200 MG CAPS Take 1 tablet by mouth daily.   sucralfate (CARAFATE) 1 g tablet Take 1 tablet (1 g total) by mouth 4 (four) times daily -  with meals and at bedtime. (Patient not taking: Reported on 12/07/2020)   No current facility-administered medications on file prior to visit.     Allergies:   Tussionex pennkinetic er [hydrocod  polst-cpm polst er], Codeine, Morphine, Morphine and related, Penicillin g, and Penicillins   Social History   Tobacco Use   Smoking status: Former    Types: Cigarettes    Quit date: 07/20/1996    Years since quitting: 24.4   Smokeless tobacco: Never  Substance Use Topics   Alcohol use: Not Currently    Alcohol/week: 0.0 standard drinks   Drug use: No    Family History: family history includes [name] (age of onset: 12) in her cousin; [name] (age of onset: 76) in her maternal aunt; [name] (age of onset: 35) in her maternal aunt; [name] (age of onset: 82) in her maternal uncle; [name] (age of onset: 21) in her maternal uncle; [name] (age of onset: 10) in her maternal grandmother; Colon [name] (age of onset: 34) in her mother; Heart attack in her father; Rectal [name] in her mother; Stomach [name] in her maternal aunt. She was adopted.  ROS:   Please see the history of present illness.  Additional pertinent ROS: Constitutional: Negative for chills, fever, night sweats, unintentional weight loss  HENT: Negative for ear pain and hearing loss.   Eyes: Negative for loss of vision and eye pain.  Respiratory: Negative for cough, sputum, wheezing.   Cardiovascular:  See HPI. Gastrointestinal: Negative for melena, and hematochezia.  Genitourinary: Negative for dysuria and hematuria.  Musculoskeletal: Negative for falls Skin: Negative for itching and rash.  Neurological: Negative for focal weakness, focal sensory changes and loss of consciousness. Positive for dizziness  EKGs/Labs/Other Studies Reviewed:    The following studies were reviewed today: Echo 08/25/2013 - Left ventricle: Normal GLS -22.5 The cavity size was normal.    Systolic function was normal. Wall motion was normal; there were    no regional wall motion abnormalities.  - Atrial septum: No defect or patent foramen ovale was identified.   EKG:  EKG is personally reviewed.   12/07/2020 Normal sinus rhythm with sinus arrhythmia  at 70 bpm. Normal ECG  Recent Labs: 08/24/2020: ALT 14; BUN 16; Creatinine 0.89; Hemoglobin 13.2; Platelet Count 250; Potassium 4.1; Sodium 140; TSH 1.113  Recent Lipid Panel    Component Value Date/Time   CHOL 174 08/03/2015 1442   TRIG 71 07/15/2014 1146   HDL 87 08/03/2015 1442   CHOLHDL 2.3 07/15/2014 1146   VLDL 14 07/15/2014 1146   LDLCALC 91 07/15/2014 1146   LDLDIRECT 95 08/03/2015 1442    Physical Exam:    VS:  BP 92/64   Pulse 70   Ht 5' 3.5" (1.613 m)   Wt 132 lb (59.9 kg)   LMP 03/11/2011   SpO2 98%   BMI 23.02 kg/m    Orthostatic VS for the past 24 hrs (Last 3 readings):  BP- Lying Pulse- Lying BP- Sitting Pulse- Sitting BP- Standing at 0 minutes Pulse- Standing at 0 minutes BP- Standing at 3 minutes Pulse- Standing at 3 minutes  12/07/20 1400 92/67 64 92/71 67 95/67 83 92/69 78     Wt Readings from Last 3 Encounters:  12/07/20 132 lb (59.9 kg)  10/08/20 129 lb 8 oz (58.7 kg)  07/20/20 125 lb (56.7 kg)    GEN: Well nourished, well developed in no acute distress HEENT: Normal, moist mucous membranes NECK: No JVD CARDIAC: regular rhythm, normal S1 and S2, no rubs or gallops. No murmur. VASCULAR: Radial and DP pulses 2+ bilaterally. No carotid bruits RESPIRATORY:  Clear to auscultation without rales, wheezing or rhonchi  ABDOMEN: Soft, non-tender, non-distended MUSCULOSKELETAL:  Ambulates independently SKIN: Warm and dry, no edema NEUROLOGIC:  Alert and oriented x 3. No focal neuro deficits noted. PSYCHIATRIC:  Normal affect    ASSESSMENT:    1. History of antineoplastic chemotherapy   2. Aortic atherosclerosis ([place])   3. Chronic fatigue   4. Dizziness on standing   5. Cardiac risk counseling   6. Counseling on health promotion and disease prevention   7. Family history of heart disease    PLAN:    Aortic atherosclerosis, calcium seen on imaging -discussed pathology of cholesterol, how plaques form, that MI/CVA result commonly from acute plaque  rupture and not gradual stenosis. Discussed mechanism of statin to both decrease plaque accumulation and stabilize plaque that is already present. Discussed that calcium is a marker for plaque, with decades of validated data regarding average amounts of calcium for age/gender/ethnicity, as well as value of calcium score for risk stratification  -we discussed the data on statins, both in terms of their long term benefit as well as the risk of side effects. Reviewed common misconceptions about statins. Reviewed how we monitor treatment. -After shared decision making, patient wishes to think about statin -she has a history of gastritis and frequent excedrin use. Will avoid aspirin at this time -counseled on  red flag warning signs that need immediate medical attention  CV risk: -we personally reviewed her CT images. Trivial aortic calcification. It was not a gated study, so unable to fully evaluate for coronary calcification -I did discuss recommendations at length, as above, for management of ASCVD with the finding of aortic atherosclerosis, mainly statin -she would like to quantitate her coronary calcium. We discussed calcium score, did discuss that it would not change recommendations, but she would like the knowledge. She understands cost is out of pocket. Will order calcium score  History of chemotherapy. -repeat echo  Other concerns, including MSK/leg intermittent tightness and pain, fatigue, dizziness with changing position -orthostatics negative today. Has low baseline blood pressure, but she is asymptomatic -no pitting edema or brawny edema on exam. Strong pulses. No evidence of cardiac edema or venous stasis. Recommended compression stockings -unclear etiology for her chronic fatigue. If echo unremarkable, unlikely to be cardiac in etiology -overall she has a complex history and multiple symptoms. I would suggest she establish with a primary care who can determine if she needs further workup.  She asked about inflammatory/autoimmune evaluation, which I deferred as this is not my area of expertise.  Cardiac risk counseling and prevention recommendations: -recommend heart healthy/Mediterranean diet, with whole grains, fruits, vegetable, fish, lean meats, nuts, and olive oil. Limit salt. -recommend moderate walking, 3-5 times/week for 30-50 minutes each session. Aim for at least 150 minutes.week. Goal should be pace of 3 miles/hours, or walking 1.5 miles in 30 minutes -recommend avoidance of tobacco products. Avoid excess alcohol.  Plan for follow up: 6 weeks to review test results  Buford Dresser, MD, PhD, [facility]    Medication Adjustments/Labs and Tests Ordered: Current medicines are reviewed at length with the patient today.  Concerns regarding medicines are outlined above.  Orders Placed This Encounter  Procedures   CT CARDIAC SCORING (SELF PAY ONLY)   EKG 12-Lead   ECHOCARDIOGRAM COMPLETE   No orders of the defined types were placed in this encounter.   Patient Instructions  Medication Instructions:  The current medical regimen is effective;  continue present plan and medications as directed. Please refer to the Current Medication list given to you today.  *If you need a refill on your cardiac medications before your next appointment, please call your pharmacy*  Lab Work: NONE  Testing/Procedures: Echocardiogram - Your physician has requested that you have an echocardiogram. Echocardiography is a painless test that uses sound waves to create images of your heart. It provides your doctor with information about the size and shape of your heart and how well your heart's chambers and valves are working. This procedure takes approximately one hour. There are no restrictions for this procedure. This will be performed at our [facility] location - [address].  SCHEDULE A CALCIUM SCORE-SELF PAY  Follow-Up: Your next  appointment:  6 week(s) In Person with Buford Dresser, MD  At [facility], you and your health needs are our priority.  As part of our continuing mission to provide you with exceptional heart care, we have created designated Provider Care Teams.  These Care Teams include your primary Cardiologist (physician) and Advanced Practice Providers (APPs -  Physician Assistants and Nurse Practitioners) who all work together to provide you with the care you need, when you need it.   Signed, Buford Dresser, MD PhD 12/07/2020 6:04 PM    [place]

## 2020-12-07 NOTE — Patient Instructions (Signed)
Medication Instructions:  The current medical regimen is effective;  continue present plan and medications as directed. Please refer to the Current Medication list given to you today.  *If you need a refill on your cardiac medications before your next appointment, please call your pharmacy*  Lab Work: NONE  Testing/Procedures: Echocardiogram - Your physician has requested that you have an echocardiogram. Echocardiography is a painless test that uses sound waves to create images of your heart. It provides your doctor with information about the size and shape of your heart and how well your heart's chambers and valves are working. This procedure takes approximately one hour. There are no restrictions for this procedure. This will be performed at our [facility] location - [address], [address].  SCHEDULE A CALCIUM SCORE-SELF PAY  Follow-Up: Your next appointment:  6 week(s) In Person with Buford Dresser, MD  At [facility], you and your health needs are our priority.  As part of our continuing mission to provide you with exceptional heart care, we have created designated Provider Care Teams.  These Care Teams include your primary Cardiologist (physician) and Advanced Practice Providers (APPs -  Physician Assistants and Nurse Practitioners) who all work together to provide you with the care you need, when you need it.

## 2020-12-08 ENCOUNTER — Encounter (HOSPITAL_BASED_OUTPATIENT_CLINIC_OR_DEPARTMENT_OTHER): Payer: Self-pay

## 2020-12-15 ENCOUNTER — Other Ambulatory Visit: Payer: Self-pay

## 2020-12-15 ENCOUNTER — Ambulatory Visit (INDEPENDENT_AMBULATORY_CARE_PROVIDER_SITE_OTHER): Payer: 59 | Admitting: Obstetrics

## 2020-12-15 ENCOUNTER — Other Ambulatory Visit (HOSPITAL_COMMUNITY)
Admission: RE | Admit: 2020-12-15 | Discharge: 2020-12-15 | Disposition: A | Discharge status: Home / Self Care | Payer: 59 | Source: Ambulatory Visit | Attending: Obstetrics | Admitting: Obstetrics

## 2020-12-15 ENCOUNTER — Encounter: Payer: Self-pay | Admitting: Obstetrics

## 2020-12-15 VITALS — BP 94/67 | HR 68 | Wt 133.0 lb

## 2020-12-15 DIAGNOSIS — M5442 Lumbago with sciatica, left side: Secondary | ICD-10-CM

## 2020-12-15 DIAGNOSIS — Z0000 Encounter for general adult medical examination without abnormal findings: Secondary | ICD-10-CM

## 2020-12-15 DIAGNOSIS — Z01419 Encounter for gynecological examination (general) (routine) without abnormal findings: Secondary | ICD-10-CM | POA: Diagnosis not present

## 2020-12-15 DIAGNOSIS — N898 Other specified noninflammatory disorders of vagina: Secondary | ICD-10-CM | POA: Diagnosis present

## 2020-12-15 DIAGNOSIS — M5441 Lumbago with sciatica, right side: Secondary | ICD-10-CM | POA: Diagnosis not present

## 2020-12-15 DIAGNOSIS — R102 Pelvic and perineal pain unspecified side: Secondary | ICD-10-CM

## 2020-12-15 DIAGNOSIS — G8929 Other chronic pain: Secondary | ICD-10-CM

## 2020-12-15 DIAGNOSIS — R140 Abdominal distension (gaseous): Secondary | ICD-10-CM | POA: Diagnosis not present

## 2020-12-15 DIAGNOSIS — E2839 Other primary ovarian failure: Secondary | ICD-10-CM

## 2020-12-15 LAB — POCT URINALYSIS DIPSTICK (MANUAL)
Leukocytes, UA: NEGATIVE
Nitrite, UA: NEGATIVE
Poct Bilirubin: NEGATIVE
Poct Blood: NEGATIVE
Poct Glucose: NORMAL mg/dL
Poct Ketones: NEGATIVE
Poct Protein: NEGATIVE mg/dL
Poct Urobilinogen: NORMAL mg/dL
Spec Grav, UA: 1.02 (ref 1.010–1.025)
pH, UA: 6 (ref 5.0–8.0)

## 2020-12-15 MED ORDER — CYCLOBENZAPRINE HCL 10 MG PO TABS: 10.0000 mg | ORAL_TABLET | Freq: Three times a day (TID) | ORAL | 2 refills | Status: DC | PRN

## 2020-12-15 NOTE — Progress Notes (Signed)
Subjective:        [name] is a 50 y.o. female here for a routine exam.  Current complaints: Malodorous vaginal discharge.  Backache and pelvic pain with abdominal bloating.  History of breast cancer and is concerned about ovarian cancer with the abdominal bloating.  Personal health questionnaire:  Is patient Ashkenazi Jewish, have a family history of breast and/or ovarian cancer: no Is there a family history of uterine cancer diagnosed at age < 93, gastrointestinal cancer, urinary tract cancer, family member who is a Field seismologist syndrome-associated carrier: yes Is the patient overweight and hypertensive, family history of diabetes, personal history of gestational diabetes, preeclampsia or PCOS: no Is patient over 16, have PCOS,  family history of premature CHD under age 59, diabetes, smoke, have hypertension or peripheral artery disease:  no At any time, has a partner hit, kicked or otherwise hurt or frightened you?: no Over the past 2 weeks, have you felt down, depressed or hopeless?: no Over the past 2 weeks, have you felt little interest or pleasure in doing things?:no   Gynecologic History Patient's last menstrual period was 03/11/2011. Contraception: post menopausal status Last Pap: 12-31-2017. Results were: normal Last mammogram: 2012. Results were: abnormal  Obstetric History OB History  No obstetric history on file.    Past Medical History:  Diagnosis Date   Abdominal pain, epigastric 11/20/2019   Asthma    as a child   Cervical dysplasia    Gastroesophageal reflux disease 11/20/2019   Genetic testing 02/01/2018   Genetic testing was performed in 2015. The [employer] OvaNext panel was ordered. In total, 23 genes were analyzed as part of this panel: ATM, BARD1, BRCA1, BRCA2, BRIP1, CDH1, CHEK2, EPCAM, MLH1, MRE11A, MSH2, MSH6, MUTYH, NBN, NF1, PALB2, PMS2, PTEN, RAD50, RAD51C, RAD51D, STK11, and TP53. No pathogenic variants were detected. Genetic testing did detect a  Variant of Unknown Significance (VUS) at the t   GERD (gastroesophageal reflux disease)    had during chemo   Headache 06/12/2013   History of chemotherapy    History of kidney stones    History of radiation therapy 11/24/13- 01/08/14   reconstructed right breast/chest wall 4500 cGy 25 sessions, electron beam boost 1440 cGy 8 sessions   Liver masses 12/06/2016   Major depressive disorder, in remission 03/12/2017   Malignant neoplasm of lower-inner quadrant of right breast of female, estrogen receptor positive 2012   Pneumonia    PONV (postoperative nausea and vomiting)    Recurrent cancer of right breast 12/22/2013   Thrombocytopenia 05/22/2013    Past Surgical History:  Procedure Laterality Date   ADENOIDECTOMY     BREAST LUMPECTOMY WITH NEEDLE LOCALIZATION Right 05/05/2013   Procedure: EXCISION RECURRENT CANCER RIGHT BREAST WITH NEEDLE LOCALOZATION;  Surgeon: Adin Hector, MD;  Location: [facility];  Service: General;  Laterality: Right;   BREAST RECONSTRUCTION  06/29/2011   Procedure: BREAST RECONSTRUCTION;  Surgeon: Theodoro Kos, DO;  Location: [facility];  Service: Plastics;  Laterality: Bilateral;  Immediate Bilateral Breast Reconstruction with Bilateral Tissue Expanders and placement of  Alloderm    DILATION AND CURETTAGE OF UTERUS     fibroid tumor surgery     lazy eye     corrective surgery   MASTECTOMY W/ SENTINEL NODE BIOPSY  06/29/2011   Procedure: MASTECTOMY WITH SENTINEL LYMPH NODE BIOPSY;  Surgeon: Adin Hector, MD;  Location: [facility];  Service: General;  Laterality: Right;  right skin spairing mstectomy and right sentinel lymph node biopsy  PORT-A-CATH REMOVAL  03/12/2012   Procedure: MINOR REMOVAL PORT-A-CATH;  Surgeon: Adin Hector, MD;  Location: [facility];  Service: General;  Laterality: N/A;  Upper left   PORTACATH PLACEMENT Left 05/05/2013   Procedure: INSERTION PORT-A-CATH;  Surgeon: Adin Hector, MD;  Location: [facility];  Service: General;  Laterality:  Left;   RHINOPLASTY     uterine cervical treatments  2002   for precancerous lesions     Current Outpatient Medications:    Biotin 5000 MCG CAPS, Take 5,000 mcg by mouth daily., Disp: , Rfl:    Cholecalciferol (VITAMIN D3) 125 MCG (5000 UT) TBDP, Take 1 tablet by mouth daily., Disp: , Rfl:    Coenzyme Q10 (COQ10) 150 MG CAPS, Take 1 capsule by mouth once., Disp: , Rfl:    cyclobenzaprine (FLEXERIL) 10 MG tablet, Take 1 tablet (10 mg total) by mouth every 8 (eight) hours as needed for muscle spasms., Disp: 30 tablet, Rfl: 2   finasteride (PROSCAR) 5 MG tablet, Take 5 mg by mouth daily. 1/2 tablet daily, Disp: , Rfl:    Prenat-Fe Poly-Methfol-FA-DHA (VITAFOL FE+) 90-0.6-0.4-200 MG CAPS, Take 1 tablet by mouth daily., Disp: 30 capsule, Rfl: 11   aspirin-acetaminophen-caffeine (EXCEDRIN MIGRAINE) 250-250-65 MG tablet, Take by mouth every 6 (six) hours as needed for headache., Disp: , Rfl:    Black Pepper-Turmeric (TURMERIC CURCUMIN) 08-998 MG CAPS, Take by mouth 2 (two) times daily., Disp: , Rfl:    pantoprazole (PROTONIX) 40 MG tablet, Take 1 tablet (40 mg total) by mouth daily. (Patient not taking: Reported on 12/15/2020), Disp: 30 tablet, Rfl: 11   sucralfate (CARAFATE) 1 g tablet, Take 1 tablet (1 g total) by mouth 4 (four) times daily -  with meals and at bedtime. (Patient not taking: No sig reported), Disp: 120 tablet, Rfl: 1 Allergies  Allergen Reactions   Tussionex Pennkinetic Er [Hydrocod Polst-Cpm Polst Er] Other (See Comments)    hallucinations   Codeine Other (See Comments)    unkown   Morphine Rash   Morphine And Related Anxiety    anxiety   Penicillin G Rash   Penicillins Rash    Social History   Tobacco Use   Smoking status: Former    Types: Cigarettes    Quit date: 07/20/1996    Years since quitting: 24.4   Smokeless tobacco: Never  Substance Use Topics   Alcohol use: Not Currently    Alcohol/week: 0.0 standard drinks    Family History  Adopted: Yes  Problem  Relation Age of Onset   Heart attack Father    Cancer Cousin 32       melanoma; mat first cousin, located on neck   Colon cancer Mother 23   Rectal cancer Mother    Cancer Maternal Aunt 60       stomach and ovarian as separate primaries   Cancer Maternal Uncle 48       lung; smoker   Cancer Maternal Uncle 63       prostate   Cancer Maternal Grandmother 75       enodometrial   Cancer Maternal Aunt 55       breast   Stomach cancer Maternal Aunt       Review of Systems  Constitutional: negative for fatigue and weight loss Respiratory: negative for cough and wheezing Cardiovascular: negative for chest pain, fatigue and palpitations Gastrointestinal: positive for abdominal bloating.   negative for change in bowel habits Musculoskeletal:positive for myalgias - backache Neurological: negative  for gait problems and tremors Behavioral/Psych: positive for anxiety.  negative for abusive relationship, depression Endocrine: negative for temperature intolerance    Genitourinary: positive for malodorous vaginal discharge and pelvic painnegative for abnormal menstrual periods, genital lesions, hot flashes, sexual problems  Integument/breast: negative for breast lump, breast tenderness, nipple discharge and skin lesion(s)    Objective:       BP 94/67   Pulse 68   Wt 133 lb (60.3 kg)   LMP 03/11/2011   BMI 23.19 kg/m  General:   Alert and no distress  Skin:   no rash or abnormalities  Lungs:   clear to auscultation bilaterally  Heart:   regular rate and rhythm, S1, S2 normal, no murmur, click, rub or gallop  Breasts:   normal without suspicious masses, skin or nipple changes or axillary nodes  Abdomen:  normal findings: no organomegaly, soft, non-tender and no hernia  Pelvis:  External genitalia: normal general appearance Urinary system: urethral meatus normal and bladder without fullness, nontender Vaginal: normal without tenderness, induration or masses Cervix: normal  appearance Adnexa: normal bimanual exam Uterus: anteverted and non-tender, normal size   Lab Review Urine pregnancy test Labs reviewed yes Radiologic studies reviewed yes  I have spent a total of 20 minutes of face-to-face time, excluding clinical staff time, reviewing notes and preparing to see patient, ordering tests and/or medications, and counseling the patient.   Assessment:    1. Encounter for gynecological examination with Papanicolaou smear of cervix Rx: - Cytology - PAP( [facility]) - POCT Urinalysis Dip Manual  2. Pelvic pain Rx: - Urine Culture  3. Abdominal bloating Rx: - US PELVIC COMPLETE WITH TRANSVAGINAL; Future  4. Chronic midline low back pain with bilateral sciatica Rx: - Ambulatory referral to Orthopedics - cyclobenzaprine (FLEXERIL) 10 MG tablet; Take 1 tablet (10 mg total) by mouth every 8 (eight) hours as needed for muscle spasms.  Dispense: 30 tablet; Refill: 2  5. Vaginal discharge Rx: - Cervicovaginal ancillary only( [facility])  6. Hypoestrogenemia - Menopausal Rx: - DG BONE DENSITY (DXA); Future  7. Routine adult health maintenance Rx: - Ambulatory referral to Internal Medicine     Plan:    Education reviewed: calcium supplements, depression evaluation, low fat, low cholesterol diet, safe sex/STD prevention, self breast exams, skin cancer screening, and weight bearing exercise. Follow up in: 1 year.   Meds ordered this encounter  Medications   cyclobenzaprine (FLEXERIL) 10 MG tablet    Sig: Take 1 tablet (10 mg total) by mouth every 8 (eight) hours as needed for muscle spasms.    Dispense:  30 tablet    Refill:  2    Orders Placed This Encounter  Procedures   Urine Culture    Order Specific Question:   Release to patient    Answer:   Immediate   US PELVIC COMPLETE WITH TRANSVAGINAL    Standing Status:   Future    Standing Expiration Date:   12/15/2021    Order Specific Question:   Reason for Exam (SYMPTOM  OR DIAGNOSIS  REQUIRED)    Answer:   Abdominal bloating and pelvic pain.  History of breast cancer.    Order Specific Question:   Preferred imaging location?    Answer:   [facility]   DG BONE DENSITY (DXA)    Standing Status:   Future    Standing Expiration Date:   12/15/2021    Order Specific Question:   Reason for Exam (SYMPTOM  OR DIAGNOSIS REQUIRED)  Answer:   Menopause.  Hypoestrogenism from chemotherapy for breast cancer and the menopause.    Order Specific Question:   Is the patient pregnant?    Answer:   No    Order Specific Question:   Preferred imaging location?    Answer:   [facility]   Ambulatory referral to Orthopedics    Referral Priority:   Routine    Referral Type:   Consultation    Number of Visits Requested:   1   Ambulatory referral to Internal Medicine    Referral Priority:   Routine    Referral Type:   Consultation    Referral Reason:   Specialty Services Required    Requested Specialty:   Internal Medicine    Number of Visits Requested:   1   POCT Urinalysis Dip Manual     Shelly Bombard, MD 12/15/2020 4:22 PM

## 2020-12-15 NOTE — Progress Notes (Signed)
Mammogram- UTD  Colonscopy-UTD

## 2020-12-16 LAB — CERVICOVAGINAL ANCILLARY ONLY
Bacterial Vaginitis (gardnerella): NEGATIVE
Candida Glabrata: NEGATIVE
Candida Vaginitis: NEGATIVE
Chlamydia: NEGATIVE
Comment: NEGATIVE
Comment: NEGATIVE
Comment: NEGATIVE
Comment: NEGATIVE
Comment: NEGATIVE
Comment: NORMAL
Neisseria Gonorrhea: NEGATIVE
Trichomonas: NEGATIVE

## 2020-12-18 LAB — URINE CULTURE

## 2020-12-20 ENCOUNTER — Other Ambulatory Visit: Payer: Self-pay | Admitting: Obstetrics

## 2020-12-20 DIAGNOSIS — M791 Myalgia, unspecified site: Secondary | ICD-10-CM

## 2020-12-20 MED ORDER — IBUPROFEN 800 MG PO TABS: 800.0000 mg | ORAL_TABLET | Freq: Three times a day (TID) | ORAL | 5 refills | Status: DC | PRN

## 2020-12-22 ENCOUNTER — Ambulatory Visit (HOSPITAL_BASED_OUTPATIENT_CLINIC_OR_DEPARTMENT_OTHER)
Admission: RE | Admit: 2020-12-22 | Discharge: 2020-12-22 | Disposition: A | Discharge status: Home / Self Care | Payer: 59 | Source: Ambulatory Visit | Attending: Obstetrics | Admitting: Obstetrics

## 2020-12-22 ENCOUNTER — Other Ambulatory Visit: Payer: Self-pay

## 2020-12-22 DIAGNOSIS — E2839 Other primary ovarian failure: Secondary | ICD-10-CM

## 2020-12-23 LAB — CYTOLOGY - PAP
Comment: NEGATIVE
Diagnosis: NEGATIVE
High risk HPV: NEGATIVE

## 2020-12-24 ENCOUNTER — Inpatient Hospital Stay: Admission: RE | Admit: 2020-12-24 | Payer: Self-pay | Source: Ambulatory Visit

## 2020-12-24 ENCOUNTER — Ambulatory Visit (HOSPITAL_COMMUNITY): Payer: 59

## 2020-12-27 ENCOUNTER — Other Ambulatory Visit: Payer: Self-pay

## 2020-12-27 ENCOUNTER — Other Ambulatory Visit (HOSPITAL_BASED_OUTPATIENT_CLINIC_OR_DEPARTMENT_OTHER): Payer: Self-pay | Admitting: Orthopaedic Surgery

## 2020-12-27 ENCOUNTER — Other Ambulatory Visit (HOSPITAL_BASED_OUTPATIENT_CLINIC_OR_DEPARTMENT_OTHER): Payer: Self-pay

## 2020-12-27 ENCOUNTER — Ambulatory Visit (HOSPITAL_BASED_OUTPATIENT_CLINIC_OR_DEPARTMENT_OTHER)
Admission: RE | Admit: 2020-12-27 | Discharge: 2020-12-27 | Disposition: A | Discharge status: Home / Self Care | Payer: 59 | Source: Ambulatory Visit | Attending: Orthopaedic Surgery | Admitting: Orthopaedic Surgery

## 2020-12-27 ENCOUNTER — Encounter (HOSPITAL_BASED_OUTPATIENT_CLINIC_OR_DEPARTMENT_OTHER): Payer: Self-pay | Admitting: Orthopaedic Surgery

## 2020-12-27 ENCOUNTER — Ambulatory Visit
Admission: RE | Admit: 2020-12-27 | Discharge: 2020-12-27 | Disposition: A | Discharge status: Home / Self Care | Payer: 59 | Source: Ambulatory Visit | Attending: Obstetrics | Admitting: Obstetrics

## 2020-12-27 ENCOUNTER — Ambulatory Visit (INDEPENDENT_AMBULATORY_CARE_PROVIDER_SITE_OTHER): Payer: 59 | Admitting: Orthopaedic Surgery

## 2020-12-27 VITALS — BP 96/73 | Ht 63.5 in | Wt 130.0 lb

## 2020-12-27 DIAGNOSIS — M5459 Other low back pain: Secondary | ICD-10-CM

## 2020-12-27 DIAGNOSIS — M545 Low back pain, unspecified: Secondary | ICD-10-CM

## 2020-12-27 DIAGNOSIS — R140 Abdominal distension (gaseous): Secondary | ICD-10-CM | POA: Insufficient documentation

## 2020-12-27 DIAGNOSIS — M546 Pain in thoracic spine: Secondary | ICD-10-CM | POA: Diagnosis present

## 2020-12-27 DIAGNOSIS — M542 Cervicalgia: Secondary | ICD-10-CM

## 2020-12-27 DIAGNOSIS — M5030 Other cervical disc degeneration, unspecified cervical region: Secondary | ICD-10-CM | POA: Diagnosis not present

## 2020-12-27 DIAGNOSIS — G8929 Other chronic pain: Secondary | ICD-10-CM

## 2020-12-27 MED ORDER — CELECOXIB 200 MG PO CAPS: 200.0000 mg | ORAL_CAPSULE | Freq: Two times a day (BID) | ORAL | 3 refills | Status: DC

## 2020-12-27 NOTE — Progress Notes (Deleted)
Lumbar

## 2020-12-27 NOTE — Progress Notes (Signed)
Chief Complaint: Neck pain, thoracic pain, lumbar pain     History of Present Illness:   Pain Score: 5/10 SANE: 60/100  [name] is a 50 y.o. female with cervical thoracic and lumbar spine pain now going on many years.  She states that the lumbar pain began in approximately 2015 after chemotherapy  Breast cancer.  She states that the neck pain has been going on for the last several years.  This she believes that is contributing to headaches which have been very severe.  She is seeing a neurologist for her headaches.  She states that she is tender and the lower neck as well as her upper thoracic and lumbar spine.  This is worse with twisting bending activities although she does have pain even while at rest.  She does take a history of muscle relaxers and Excedrin.  She is not supposed to take anti-inflammatories due to history of gastritis.  She does have occasional pain that radiates down the lower buttocks area and into the legs.  She states that this is very disabling and very difficult at the end of a long day to even make dinner.    Surgical History:   None  PMH/PSH/Family History/Social History/Meds/Allergies:    Past Medical History:  Diagnosis Date   Abdominal pain, epigastric 11/20/2019   Asthma    as a child   Cervical dysplasia    Gastroesophageal reflux disease 11/20/2019   Genetic testing 02/01/2018   Genetic testing was performed in 2015. The [employer] panel was ordered. In total, 23 genes were analyzed as part of this panel: ATM, BARD1, BRCA1, BRCA2, BRIP1, CDH1, CHEK2, EPCAM, MLH1, MRE11A, MSH2, MSH6, MUTYH, NBN, NF1, PALB2, PMS2, PTEN, RAD50, RAD51C, RAD51D, STK11, and TP53. No pathogenic variants were detected. Genetic testing did detect a Variant of Unknown Significance (VUS) at the t   GERD (gastroesophageal reflux disease)    had during chemo   Headache 06/12/2013   History of chemotherapy    History of kidney stones     History of radiation therapy 11/24/13- 01/08/14   reconstructed right breast/chest wall 4500 cGy 25 sessions, electron beam boost 1440 cGy 8 sessions   Liver masses 12/06/2016   Major depressive disorder, in remission 03/12/2017   Malignant neoplasm of lower-inner quadrant of right breast of female, estrogen receptor positive 2012   Pneumonia    PONV (postoperative nausea and vomiting)    Recurrent cancer of right breast 12/22/2013   Thrombocytopenia 05/22/2013   Past Surgical History:  Procedure Laterality Date   ADENOIDECTOMY     BREAST LUMPECTOMY WITH NEEDLE LOCALIZATION Right 05/05/2013   Procedure: EXCISION RECURRENT CANCER RIGHT BREAST WITH NEEDLE LOCALOZATION;  Surgeon: Adin Hector, MD;  Location: [facility];  Service: General;  Laterality: Right;   BREAST RECONSTRUCTION  06/29/2011   Procedure: BREAST RECONSTRUCTION;  Surgeon: Theodoro Kos, DO;  Location: [facility];  Service: Plastics;  Laterality: Bilateral;  Immediate Bilateral Breast Reconstruction with Bilateral Tissue Expanders and placement of  Alloderm    DILATION AND CURETTAGE OF UTERUS     fibroid tumor surgery     lazy eye     corrective surgery   MASTECTOMY W/ SENTINEL NODE BIOPSY  06/29/2011   Procedure: MASTECTOMY WITH SENTINEL LYMPH NODE BIOPSY;  Surgeon: Adin Hector, MD;  Location:  [facility];  Service: General;  Laterality: Right;  right skin spairing mstectomy and right sentinel lymph node biopsy   PORT-A-CATH REMOVAL  03/12/2012   Procedure: MINOR REMOVAL PORT-A-CATH;  Surgeon: Adin Hector, MD;  Location: [facility];  Service: General;  Laterality: N/A;  Upper left   PORTACATH PLACEMENT Left 05/05/2013   Procedure: INSERTION PORT-A-CATH;  Surgeon: Adin Hector, MD;  Location: [facility];  Service: General;  Laterality: Left;   RHINOPLASTY     uterine cervical treatments  2002   for precancerous lesions   Social History   Socioeconomic History   Marital status: Married    Spouse name: Not on file    Number of children: Not on file   Years of education: 10   Highest education level: GED or equivalent  Occupational History   Not on file  Tobacco Use   Smoking status: Former    Types: Cigarettes    Quit date: 07/20/1996    Years since quitting: 24.4   Smokeless tobacco: Never  Vaping Use   Vaping Use: Not on file  Substance and Sexual Activity   Alcohol use: Not Currently    Alcohol/week: 0.0 standard drinks   Drug use: No   Sexual activity: Yes    Partners: Male  Other Topics Concern   Not on file  Social History Narrative   She was adopted by her grandparents as a child.  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  She works in a Engineer, agricultural.right handed   Two story home    Drinks caffeine occasional   Right handed   Social Determinants of Health   Financial Resource Strain: Not on file  Food Insecurity: Not on file  Transportation Needs: Not on file  Physical Activity: Not on file  Stress: Not on file  Social Connections: Not on file   Family History  Adopted: Yes  Problem Relation Age of Onset   Heart attack Father    Cancer Cousin 76       melanoma; mat first cousin, located on neck   Colon cancer Mother 81   Rectal cancer Mother    Cancer Maternal Aunt 60       stomach and ovarian as separate primaries   Cancer Maternal Uncle 65       lung; smoker   Cancer Maternal Uncle 63       prostate   Cancer Maternal Grandmother 75       enodometrial   Cancer Maternal Aunt 55       breast   Stomach cancer Maternal Aunt    Allergies  Allergen Reactions   Tussionex Pennkinetic Er [Hydrocod Polst-Cpm Polst Er] Other (See Comments)    hallucinations   Codeine Other (See Comments)    unkown   Morphine Rash   Morphine And Related Anxiety    anxiety   Penicillin G Rash   Penicillins Rash   Current Outpatient Medications  Medication Sig Dispense Refill   aspirin-acetaminophen-caffeine (EXCEDRIN MIGRAINE) 250-250-65 MG tablet Take  by mouth every 6 (six) hours as needed for headache.     Biotin 5000 MCG CAPS Take 5,000 mcg by mouth daily.     Black Pepper-Turmeric (TURMERIC CURCUMIN) 08-998 MG CAPS Take by mouth 2 (two) times daily.     Cholecalciferol (VITAMIN D3) 125 MCG (5000 UT) TBDP Take 1 tablet by mouth daily.     Coenzyme Q10 (COQ10) 150 MG CAPS Take 1 capsule  by mouth once.     cyclobenzaprine (FLEXERIL) 10 MG tablet Take 1 tablet (10 mg total) by mouth every 8 (eight) hours as needed for muscle spasms. 30 tablet 2   finasteride (PROSCAR) 5 MG tablet Take 5 mg by mouth daily. 1/2 tablet daily     ibuprofen (ADVIL) 800 MG tablet Take 1 tablet (800 mg total) by mouth every 8 (eight) hours as needed. 30 tablet 5   pantoprazole (PROTONIX) 40 MG tablet Take 1 tablet (40 mg total) by mouth daily. (Patient not taking: Reported on 12/15/2020) 30 tablet 11   Prenat-Fe Poly-Methfol-FA-DHA (VITAFOL FE+) 90-0.6-0.4-200 MG CAPS Take 1 tablet by mouth daily. 30 capsule 11   sucralfate (CARAFATE) 1 g tablet Take 1 tablet (1 g total) by mouth 4 (four) times daily -  with meals and at bedtime. (Patient not taking: No sig reported) 120 tablet 1   No current facility-administered medications for this visit.   No results found.  Review of Systems:   A ROS was performed including pertinent positives and negatives as documented in the HPI.  Physical Exam :   Constitutional: NAD and appears stated age Neurological: Alert and oriented Psych: Appropriate affect and cooperative Blood pressure 96/73, height 5' 3.5" (1.613 m), weight 130 lb (59 kg), last menstrual period 03/11/2011.   Comprehensive Musculoskeletal Exam:   She is tender to palpation over the lower cervical spine.  Full strength in bilateral upper extremities.  Sensation intact in bilateral upper extremities.  Negative Hoffmann's bilaterally  Tender to palpation about the lumbar spine as well as thoracic mid spine with no weakness in the distributions of her lower  extremities.  Denies any numbness in her lower extremities.  Reflexes are symmetric.  Negative straight leg raise  Imaging:   Xray (cervical thoracic and lumbar spine): There is evidence of degenerative disc disease particularly at the C6-C7 level as well as mid thoracic and L5-S1  I personally reviewed and interpreted the radiographs.   Assessment:   50 year old female with degenerative disc disease involving the cervical thoracic and lumbar spine.  I would advise her to participate in physical therapy at this time in order to optimize her current spinal issues.  I did also advise that she may be a candidate for a lumbar epidural injection given her pain radiating down the legs into the lower back area.  She would like to avoid surgery if at all possible  Plan :    -Physical therapy prescription for core strengthening as well as cervical program -Referral for possible lumbar injection -Provided a prescription for Celebrex given that she is unable to take NSAIDs due to gastritis, recommend against additional NSAIDs while she is taking this -She will follow-up as needed  I personally saw and evaluated the patient, and participated in the management and treatment plan.  Vanetta Mulders, MD Attending Physician, Orthopedic Surgery  This document was dictated using Dragon voice recognition software. A reasonable attempt at proof reading has been made to minimize errors.

## 2020-12-28 ENCOUNTER — Telehealth (INDEPENDENT_AMBULATORY_CARE_PROVIDER_SITE_OTHER): Payer: 59 | Admitting: Obstetrics

## 2020-12-28 DIAGNOSIS — R140 Abdominal distension (gaseous): Secondary | ICD-10-CM | POA: Diagnosis not present

## 2020-12-28 DIAGNOSIS — Z0000 Encounter for general adult medical examination without abnormal findings: Secondary | ICD-10-CM

## 2020-12-28 NOTE — Progress Notes (Signed)
Follow up US and bone density.

## 2020-12-28 NOTE — Progress Notes (Signed)
TELEHEALTH GYNECOLOGY VISIT ENCOUNTER NOTE  Provider location: [facility] for [place] [facility]   Patient location: Home  I connected with [name] on 12/28/20 at  1:50 PM EDT by telephone and verified that I am speaking with the correct person using two identifiers. Patient was unable to do MyChart audiovisual encounter due to technical difficulties, she tried several times.    I discussed the limitations, risks, security and privacy concerns of performing an evaluation and management service by telephone and the availability of in person appointments. I also discussed with the patient that there may be a patient responsible charge related to this service. The patient expressed understanding and agreed to proceed.   History:  [name] is a 50 y.o. No obstetric history on file. female being evaluated today for bone density and ultrasound results, and management recommendations. She denies any abnormal vaginal discharge, bleeding, pelvic pain or other concerns.       Past Medical History:  Diagnosis Date   Abdominal pain, epigastric 11/20/2019   Asthma    as a child   Cervical dysplasia    Gastroesophageal reflux disease 11/20/2019   Genetic testing 02/01/2018   Genetic testing was performed in 2015. The [employer] OvaNext panel was ordered. In total, 23 genes were analyzed as part of this panel: ATM, BARD1, BRCA1, BRCA2, BRIP1, CDH1, CHEK2, EPCAM, MLH1, MRE11A, MSH2, MSH6, MUTYH, NBN, NF1, PALB2, PMS2, PTEN, RAD50, RAD51C, RAD51D, STK11, and TP53. No pathogenic variants were detected. Genetic testing did detect a Variant of Unknown Significance (VUS) at the t   GERD (gastroesophageal reflux disease)    had during chemo   Headache 06/12/2013   History of chemotherapy    History of kidney stones    History of radiation therapy 11/24/13- 01/08/14   reconstructed right breast/chest wall 4500 cGy 25 sessions, electron beam boost 1440 cGy 8 sessions   Liver masses 12/06/2016    Major depressive disorder, in remission 03/12/2017   Malignant neoplasm of lower-inner quadrant of right breast of female, estrogen receptor positive 2012   Pneumonia    PONV (postoperative nausea and vomiting)    Recurrent cancer of right breast 12/22/2013   Thrombocytopenia 05/22/2013   Past Surgical History:  Procedure Laterality Date   ADENOIDECTOMY     BREAST LUMPECTOMY WITH NEEDLE LOCALIZATION Right 05/05/2013   Procedure: EXCISION RECURRENT CANCER RIGHT BREAST WITH NEEDLE LOCALOZATION;  Surgeon: Adin Hector, MD;  Location: [place];  Service: General;  Laterality: Right;   BREAST RECONSTRUCTION  06/29/2011   Procedure: BREAST RECONSTRUCTION;  Surgeon: Theodoro Kos, DO;  Location: [place];  Service: Plastics;  Laterality: Bilateral;  Immediate Bilateral Breast Reconstruction with Bilateral Tissue Expanders and placement of  Alloderm    DILATION AND CURETTAGE OF UTERUS     fibroid tumor surgery     lazy eye     corrective surgery   MASTECTOMY W/ SENTINEL NODE BIOPSY  06/29/2011   Procedure: MASTECTOMY WITH SENTINEL LYMPH NODE BIOPSY;  Surgeon: Adin Hector, MD;  Location: [place];  Service: General;  Laterality: Right;  right skin spairing mstectomy and right sentinel lymph node biopsy   PORT-A-CATH REMOVAL  03/12/2012   Procedure: MINOR REMOVAL PORT-A-CATH;  Surgeon: Adin Hector, MD;  Location: [place];  Service: General;  Laterality: N/A;  Upper left   PORTACATH PLACEMENT Left 05/05/2013   Procedure: INSERTION PORT-A-CATH;  Surgeon: Adin Hector, MD;  Location: [place];  Service: General;  Laterality: Left;  RHINOPLASTY     uterine cervical treatments  2002   for precancerous lesions   The following portions of the patient's history were reviewed and updated as appropriate: allergies, current medications, past family history, past medical history, past social history, past surgical history and problem list.   Health Maintenance:  Normal pap and negative HRHPV  on 12-15-2020.    Review of Systems:  Pertinent items noted in HPI and remainder of comprehensive ROS otherwise negative.  Physical Exam:   General:  Alert, oriented and cooperative.   Mental Status: Normal mood and affect perceived. Normal judgment and thought content.  Physical exam deferred due to nature of the encounter  Labs and Imaging Results for orders placed or performed in visit on 12/15/20 (from the past 336 hour(s))  Urine Culture   Collection Time: 12/15/20  3:22 AM   Specimen: Urine   UC  Result Value Ref Range   Urine Culture, Routine Final report    Organism ID, Bacteria Comment   Cytology - PAP( [place])   Collection Time: 12/15/20  2:02 PM  Result Value Ref Range   High risk HPV Negative    Adequacy      Satisfactory for evaluation. The presence or absence of an   Adequacy      endocervical/transformation zone component cannot be determined because   Adequacy of atrophy.    Diagnosis      - Negative for intraepithelial lesion or malignancy (NILM)   Comment Normal Reference Range HPV - Negative   Cervicovaginal ancillary only( [place])   Collection Time: 12/15/20  2:56 PM  Result Value Ref Range   Candida Vaginitis Negative    Candida Glabrata Negative    Trichomonas Negative    Chlamydia Negative    Neisseria Gonorrhea Negative    Bacterial Vaginitis (gardnerella) Negative    Comment Normal Reference Range Candida Species - Negative    Comment Normal Reference Range Candida Galbrata - Negative    Comment Normal Reference [place] Chlamydia - Negative    Comment      Normal Reference Range Neisseria Gonorrhea - Negative   Comment Normal Reference Range Trichomonas - Negative    Comment      Normal Reference Range Bacterial Vaginosis - Negative  POCT Urinalysis Dip Manual   Collection Time: 12/15/20  3:15 PM  Result Value Ref Range   Spec Grav, UA 1.020 1.010 - 1.025   pH, UA 6.0 5.0 - 8.0   Leukocytes, UA Negative Negative   Nitrite, UA  Negative Negative   Poct Protein Negative Negative, trace mg/dL   Poct Glucose Normal Normal mg/dL   Poct Ketones Negative Negative   Poct Urobilinogen Normal Normal mg/dL   Poct Bilirubin Negative Negative   Poct Blood Negative Negative, trace   DG Cervical Spine Complete  Result Date: 12/27/2020 CLINICAL DATA:  Pain. EXAM: LUMBAR SPINE - COMPLETE 4+ VIEW; CERVICAL SPINE - COMPLETE 4+ VIEW; THORACIC SPINE - 3 VIEWS COMPARISON:  CT chest 08/11/2019.  Lumbar spine x-ray 03/29/2017. FINDINGS: Findings cervical spine: There is no evidence for cervical spine fracture. Spinal alignment is within normal limits. There is straightening of cervical lordosis which can be seen with muscle spasm or patient positioning. There is disc space narrowing and endplate osteophyte formation at C6-C7 compatible with degenerative change. Prevertebral soft tissues are within normal limits. Neural foramina appear patent C1-C2 interval is within normal limits on the open-mouth view. Findings thoracic spine: There is no evidence of thoracic spine fracture.  Alignment is normal. Intervertebral disc spaces are maintained. Soft tissues are within normal limits. Findings lumbar spine: There is no evidence for lumbar spine fracture. Alignment is normal. Intervertebral disc spaces are maintained. Soft tissues are within normal limits. IMPRESSION: 1. No acute fracture or malalignment of the cervical spine, thoracic spine or lumbar spine. 2. Moderate degenerative changes at C6-C7. Electronically Signed   By: Ronney Asters M.D.   On: 12/27/2020 20:54   DG Thoracic Spine W/Swimmers  Result Date: 12/27/2020 CLINICAL DATA:  Pain. EXAM: LUMBAR SPINE - COMPLETE 4+ VIEW; CERVICAL SPINE - COMPLETE 4+ VIEW; THORACIC SPINE - 3 VIEWS COMPARISON:  CT chest 08/11/2019.  Lumbar spine x-ray 03/29/2017. FINDINGS: Findings cervical spine: There is no evidence for cervical spine fracture. Spinal alignment is within normal limits. There is straightening of  cervical lordosis which can be seen with muscle spasm or patient positioning. There is disc space narrowing and endplate osteophyte formation at C6-C7 compatible with degenerative change. Prevertebral soft tissues are within normal limits. Neural foramina appear patent C1-C2 interval is within normal limits on the open-mouth view. Findings thoracic spine: There is no evidence of thoracic spine fracture. Alignment is normal. Intervertebral disc spaces are maintained. Soft tissues are within normal limits. Findings lumbar spine: There is no evidence for lumbar spine fracture. Alignment is normal. Intervertebral disc spaces are maintained. Soft tissues are within normal limits. IMPRESSION: 1. No acute fracture or malalignment of the cervical spine, thoracic spine or lumbar spine. 2. Moderate degenerative changes at C6-C7. Electronically Signed   By: Ronney Asters M.D.   On: 12/27/2020 20:54   DG Lumbar Spine Complete  Result Date: 12/27/2020 CLINICAL DATA:  Pain. EXAM: LUMBAR SPINE - COMPLETE 4+ VIEW; CERVICAL SPINE - COMPLETE 4+ VIEW; THORACIC SPINE - 3 VIEWS COMPARISON:  CT chest 08/11/2019.  Lumbar spine x-ray 03/29/2017. FINDINGS: Findings cervical spine: There is no evidence for cervical spine fracture. Spinal alignment is within normal limits. There is straightening of cervical lordosis which can be seen with muscle spasm or patient positioning. There is disc space narrowing and endplate osteophyte formation at C6-C7 compatible with degenerative change. Prevertebral soft tissues are within normal limits. Neural foramina appear patent C1-C2 interval is within normal limits on the open-mouth view. Findings thoracic spine: There is no evidence of thoracic spine fracture. Alignment is normal. Intervertebral disc spaces are maintained. Soft tissues are within normal limits. Findings lumbar spine: There is no evidence for lumbar spine fracture. Alignment is normal. Intervertebral disc spaces are maintained. Soft  tissues are within normal limits. IMPRESSION: 1. No acute fracture or malalignment of the cervical spine, thoracic spine or lumbar spine. 2. Moderate degenerative changes at C6-C7. Electronically Signed   By: Ronney Asters M.D.   On: 12/27/2020 20:54   DG BONE DENSITY (DXA)  Result Date: 12/22/2020 EXAM: DUAL X-RAY ABSORPTIOMETRY (DXA) FOR BONE MINERAL DENSITY IMPRESSION: Patient: [name] Referring Physician: Shelly Bombard Birth Date: 1970-09-28 [age]:       [age] Patient ID: [id number] Height: 63.5 in. Weight: 133.0 lbs. Measured: 12/22/2020 2:09:05 PM (18 SP 3) Sex: Female Ethnicity: White Analyzed: 12/22/2020 2:09:24 PM (18 SP 3) FRAX* Based on femoral neck BMD: DualFemur (Left) 10-year Probability of Fracture Major Osteoporotic Fracture: 4.9 % Hip Fracture:                0.4 % Population:                  [place] (Caucasian) Risk Factors:  None *FRAX is a Materials engineer of the [employer] for Metabolic Bone Diseases, a [facility]) [facility] [phone]). Referring Physician:  Shelly Bombard Your patient completed a bone mineral density test using GE Lunar iDXA system (analysis version: 16). Technologist: ALW PATIENT: Name: Lake, Breeding Patient ID: [id number] Birth Date: June 03, 1970 Height: 63.5 in. Sex: Female Measured: 12/22/2020 Weight: 133.0 lbs. Indications: Breast Cancer History, Caucasian, Estrogen Deficiency, Post Menopausal Fractures: Treatments: Multivitamin, Vitamin D ASSESSMENT: The BMD measured at AP Spine L1-L4 is 0.894 g/cm2 with a T-score of -2.4. This patient is considered to have osteopenia/low bone mass according to [facility]) criteria. The scan quality is good. Site Region Measured Date Measured Age YA BMD Significant CHANGE T-score AP Spine  L1-L4      12/22/[age]         -2.4    0.894 g/cm2 DualFemur Neck Left  12/22/2020    50.0         -1.6    0.822 g/cm2 DualFemur  Total Mean 12/22/2020    51.0         -1.3    0.844 g/cm2 World Health Organization [facility]) criteria for post-menopausal, Caucasian Women: Normal       T-score at or above -1 SD Osteopenia   T-score between -1 and -2.5 SD Osteoporosis T-score at or below -2.5 SD RECOMMENDATION: 1. All patients should optimize calcium and vitamin D intake. 2. Consider FDA approved medical therapies in postmenopausal women and men aged 75 years and older, based on the following: a. A hip or vertebral (clinical or morphometric) fracture b. T-score = -2.5 at the femoral neck or spine after appropriate evaluation to exclude secondary causes c. Low bone mass (T-score between -1.0 and -2.5 at the femoral neck or spine) and a 10- year probability of a hip fracture = 3% or a 10 year probability of a major osteoporosis-related fracture = 20% based on the [place]-adapted WHO algorithm. 3. Clinician judgement and/or patient preference may indicate treatment for people with10-year fracture probabilities above or below these levels. FOLLOW-UP: Patients with diagnosis of osteoporosis or at high risk for fracture should have regular bone mineral density tests. For patients eligible for Medicare routine testing is allowed once every 2 years. The testing frequency can be increased to one year for patients who have rapidly progressing disease, those who are receiving or discontinuing medical therapy to restore bone mass, or have additional risk factors. I have reviewed this study and agree with the findings. [facility] Electronically Signed   By: Rolm Baptise M.D.   On: 12/22/2020 14:50   US PELVIC COMPLETE WITH TRANSVAGINAL  Result Date: 12/27/2020 CLINICAL DATA:  Abdominal bloating, pelvic pain, history breast cancer with history of tamoxifen use for up to 1.5 years but no longer taking it EXAM: TRANSABDOMINAL AND TRANSVAGINAL ULTRASOUND OF PELVIS TECHNIQUE: Both transabdominal and transvaginal ultrasound examinations of the pelvis were  performed. Transabdominal technique was performed for global imaging of the pelvis including uterus, ovaries, adnexal regions, and pelvic cul-de-sac. It was necessary to proceed with endovaginal exam following the transabdominal exam to visualize the endometrium and ovaries. COMPARISON:  01/01/2018 FINDINGS: Uterus Measurements: 6.1 x 3.0 x 3.4 cm = volume: 33 mL. Retroverted. Normal morphology without mass Endometrium Thickness: 7 mm. Mildly heterogeneous with a few tiny cystic foci. No focal mass or fluid. Right ovary Measurements: 2.0 x 1.1 x 1.1 cm = volume: 1.2 mL. Normal  morphology without mass Left ovary Measurements: 2.0 x 1.1 x 1.3 cm = volume: 1.4 mL. Normal morphology without mass Other findings Trace free pelvic fluid.  No adnexal masses. IMPRESSION: Endometrial complex is upper normal in thickness, 7 mm thick and containing a few tiny cystic foci, could represent cystic hyperplasia or be related to prior tamoxifen therapy. In the absence of abnormal uterine bleeding, this is a nonspecific finding. If the patient develops abnormal uterine bleeding, endometrial biopsy would be indicated to exclude endometrial carcinoma. Electronically Signed   By: Lavonia Dana M.D.   On: 12/27/2020 15:34       Assessment and Plan:     1. Abdominal bloating - ultrasound WNL's  2. Routine adult health maintenance Rx: - Ambulatory referral to Internal Medicine        I discussed the assessment and treatment plan with the patient. The patient was provided an opportunity to ask questions and all were answered. The patient agreed with the plan and demonstrated an understanding of the instructions.   The patient was advised to call back or seek an in-person evaluation/go to the ED if the symptoms worsen or if the condition fails to improve as anticipated.  I have spent a total of 20 minutes of non-face-to-face time, excluding clinical staff time, reviewing notes and preparing to see patient, ordering tests and/or  medications, and counseling the patient.    Baltazar Najjar, MD Center for [facility], [place] [facility]  12/28/20

## 2020-12-29 ENCOUNTER — Encounter: Payer: Self-pay | Admitting: Obstetrics

## 2020-12-29 ENCOUNTER — Ambulatory Visit: Payer: 59 | Admitting: Surgery

## 2020-12-30 ENCOUNTER — Other Ambulatory Visit: Payer: Self-pay

## 2020-12-30 ENCOUNTER — Ambulatory Visit (INDEPENDENT_AMBULATORY_CARE_PROVIDER_SITE_OTHER): Payer: 59 | Admitting: Physical Medicine and Rehabilitation

## 2020-12-30 ENCOUNTER — Encounter: Payer: Self-pay | Admitting: Physical Medicine and Rehabilitation

## 2020-12-30 VITALS — BP 95/68 | HR 77

## 2020-12-30 DIAGNOSIS — M7918 Myalgia, other site: Secondary | ICD-10-CM | POA: Diagnosis not present

## 2020-12-30 DIAGNOSIS — M5416 Radiculopathy, lumbar region: Secondary | ICD-10-CM | POA: Diagnosis not present

## 2020-12-30 DIAGNOSIS — M546 Pain in thoracic spine: Secondary | ICD-10-CM | POA: Diagnosis not present

## 2020-12-30 DIAGNOSIS — M5412 Radiculopathy, cervical region: Secondary | ICD-10-CM

## 2020-12-30 DIAGNOSIS — G8929 Other chronic pain: Secondary | ICD-10-CM

## 2020-12-30 NOTE — Progress Notes (Signed)
Pt state neck pain, middle and lower back pain. Pt state she has pain going down both legs. Pt state walking, standing and bending over makes the pain worse. Pt state she takes over the counter pain meds to help ease her pain. Pt state she has pain in her neck and head. Pt state keeping her head still while reading a book causes pain. Pt state she has headaches.   Numeric Pain Rating Scale and Functional Assessment Average Pain 10 Pain Right Now 5 My pain is intermittent, constant, burning, dull, and aching Pain is worse with: walking, bending, standing, and some activites Pain improves with: medication   In the last MONTH (on 0-10 scale) has pain interfered with the following?  1. General activity like being  able to carry out your everyday physical activities such as walking, climbing stairs, carrying groceries, or moving a chair?  Rating(7)  2. Relation with others like being able to carry out your usual social activities and roles such as  activities at home, at work and in your community. Rating(8)  3. Enjoyment of life such that you have  been bothered by emotional problems such as feeling anxious, depressed or irritable?  Rating(9)

## 2020-12-30 NOTE — Progress Notes (Signed)
[name] - 50 y.o. female MRN [id number]  Date of birth: 03/31/1971  Office Visit Note: Visit Date: 12/30/2020 PCP: Patient, No Pcp Per (Inactive) Referred by: Vanetta Mulders, MD  Subjective: Chief Complaint  Patient presents with   Neck - Pain   Middle Back - Pain   Lower Back - Pain   HPI: [name] is a 50 y.o. female who comes in today Per the request of Dr. Vanetta Mulders for evaluation of multiple chronic issues ongoing for several years. Chronic, worsening and severe bilateral lower back pain radiating to posterior thighs and down to knees. Patient states pain is exacerbated by walking and activity. She describes pain as soreness sensation, currently rates as 8 out of 10. Patient states pain is relieved with rest. Patient states bilateral lower back pain is the most severe discomfort she is having currently. Patient's recent lumbar x-ray was relatively normal, no listhesis noted, well maintained disc spacing.   Patient reports diffuse thoracic pain that worsens with movement. Patient describes this as a tenderness and reports some relief of pain with heat and rest. Patient's recent thoracic x-rays exhibit normal alignment, well maintained disc spacing, no fractures.    Patient also reports bilateral neck pain that radiates to bilateral arms and hands. Patient reports pain worsens with movement, activity and lifting, describes as tightness and shooting sensation. Patient's recent cervical x-ray exhibits disc space narrowing at C6-C7 consistent with moderate degenerative changes. No listhesis noted.   Patient has a significant medical history of metastatic breast cancer, chronic migraine headaches, and major depressive disorder. Patient is currently being managed by Dr. Lurline Del with [facility], patient states she is currently in remission. Patient has referral placed by Dr. Vanetta Mulders for formal physical therapy at [facility],  first appointment scheduled for 01/07/21. Patient states she is having difficulty performing tasks at home and work due to severe pain. Patient denies focal weakness, numbness and tingling. Patient denies recent trauma or falls.   Review of Systems  Musculoskeletal:  Positive for back pain, myalgias and neck pain.  Neurological:  Negative for tingling, sensory change, focal weakness and weakness.  All other systems reviewed and are negative. Otherwise per HPI.  Assessment & Plan: Visit Diagnoses:    ICD-10-CM   1. Lumbar radiculopathy  M54.16     2. Chronic bilateral thoracic back pain  M54.6    G89.29     3. Radiculopathy, cervical region  M54.12     4. Myofascial pain syndrome  M79.18        Plan: Findings:  1. Chronic, worsening and severe bilateral lower back pain radiating to posterior thighs and down to knees. Patient continues to have severe pain despite good conservative therapies. Patient's clinical presentation and exam are consistent with classic L5 nerve pattern. We believe the next step is obtain lumbar MRI. We will follow-up with patient after lumbar MRI is completed for review and at that time will consider lumbar epidural steroid injection if warranted. Patient is scheduled to start physical therapy next week and we will also follow-up after her first couple of sessions to see if treatments are working to alleviate pain.   2. Chronic, worsening and severe diffuse thoracic pain. We feel that this pain is more myofascial related, rather than a spine issue. We will re-evaluate after several sessions of physical therapy.   3. Chronic, worsening and severe bilateral neck pain radiating to arms and hands. Patient's clinical presentation and exam are  consistent with classic C6 nerve pattern. We feel the next step is to perform cervical MRI. We will follow-up with patient after cervical MRI is obtained to review, at this time we will also evaluate for possible cervical epidural  steroid injection if warranted. We will also re-evaluate neck pain after several sessions of physical therapy.   We discussed in detail today patient's recent lumbar, thoracic and cervical x-rays using images. Epidural steroid injection procedure explained to patient in detail today. We also discussed pharmacodynamics of corticosteroids with patient. No red flag noted upon exam today.    Meds & Orders: No orders of the defined types were placed in this encounter.  No orders of the defined types were placed in this encounter.   Follow-up: Return in about 2 weeks (around 01/13/2021) for Cervical and Lumbar MRI.   Procedures: No procedures performed      Clinical History: No specialty comments available.   She reports that she quit smoking about 24 years ago. Her smoking use included cigarettes. She has never used smokeless tobacco. No results for input(s): HGBA1C, LABURIC in the last 8760 hours.  Objective:  VS:  HT:    WT:   BMI:     BP:95/68  HR:77bpm  TEMP: ( )  RESP:  Physical Exam HENT:     Head: Normocephalic and atraumatic.     Right Ear: Tympanic membrane normal.     Left Ear: Tympanic membrane normal.     Nose: Nose normal.     Mouth/Throat:     Mouth: Mucous membranes are moist.  Eyes:     Pupils: Pupils are equal, round, and reactive to light.  Cardiovascular:     Rate and Rhythm: Normal rate.     Pulses: Normal pulses.  Pulmonary:     Effort: Pulmonary effort is normal.  Abdominal:     General: Abdomen is flat. There is no distension.  Musculoskeletal:        General: Tenderness present.     Cervical back: Tenderness present.     Comments: Pt rises from seated position to standing without difficulty. Good lumbar range of motion. Strong distal strength without clonus, no pain upon palpation of greater trochanters. Sensation intact bilaterally. Dysesthesias noted to L5 dermatome. Walks independently, gait steady.   No discomfort noted with flexion, extension and  side-to-side rotation. Good strength noted to bilateral upper extremities. Sensation intact bilaterally. Negative Hoffman's sign.       Skin:    General: Skin is warm and dry.     Capillary Refill: Capillary refill takes less than 2 seconds.  Neurological:     General: No focal deficit present.     Mental Status: She is alert.  Psychiatric:        Mood and Affect: Mood normal.    Ortho Exam  Imaging: No results found.  Past Medical/Family/Surgical/Social History: Medications & Allergies reviewed per EMR, new medications updated. Patient Active Problem List   Diagnosis Date Noted   Gastroesophageal reflux disease 11/20/2019   Abdominal pain, epigastric 11/20/2019   Major depressive disorder, in remission 03/12/2017   Liver masses 12/06/2016   Uterine cramping 01/08/2014   Recurrent cancer of right breast 12/22/2013   Family history of malignant neoplasm of ovary 07/25/2013   Family history of malignant neoplasm of breast 07/25/2013   Family history of malignant neoplasm of gastrointestinal tract 07/25/2013   Headache 06/12/2013   Nausea alone 06/12/2013   Thrombocytopenia 05/22/2013   Status post bilateral breast reconstruction 01/05/2012  Acquired absence of bilateral breasts and nipples 11/23/2011   Malignant neoplasm of lower-inner quadrant of right breast of female, estrogen receptor positive 2012   Past Medical History:  Diagnosis Date   Abdominal pain, epigastric 11/20/2019   Asthma    as a child   Cervical dysplasia    Gastroesophageal reflux disease 11/20/2019   Genetic testing 02/01/2018   Genetic testing was performed in 2015. The [employer] panel was ordered. In total, 23 genes were analyzed as part of this panel: ATM, BARD1, BRCA1, BRCA2, BRIP1, CDH1, CHEK2, EPCAM, MLH1, MRE11A, MSH2, MSH6, MUTYH, NBN, NF1, PALB2, PMS2, PTEN, RAD50, RAD51C, RAD51D, STK11, and TP53. No pathogenic variants were detected. Genetic testing did detect a Variant of Unknown  Significance (VUS) at the t   GERD (gastroesophageal reflux disease)    had during chemo   Headache 06/12/2013   History of chemotherapy    History of kidney stones    History of radiation therapy 11/24/13- 01/08/14   reconstructed right breast/chest wall 4500 cGy 25 sessions, electron beam boost 1440 cGy 8 sessions   Liver masses 12/06/2016   Major depressive disorder, in remission 03/12/2017   Malignant neoplasm of lower-inner quadrant of right breast of female, estrogen receptor positive 2012   Pneumonia    PONV (postoperative nausea and vomiting)    Recurrent cancer of right breast 12/22/2013   Thrombocytopenia 05/22/2013   Family History  Adopted: Yes  Problem Relation Age of Onset   Heart attack Father    Cancer Cousin 70       melanoma; mat first cousin, located on neck   Colon cancer Mother 109   Rectal cancer Mother    Cancer Maternal Aunt 60       stomach and ovarian as separate primaries   Cancer Maternal Uncle 65       lung; smoker   Cancer Maternal Uncle 63       prostate   Cancer Maternal Grandmother 75       enodometrial   Cancer Maternal Aunt 55       breast   Stomach cancer Maternal Aunt    Past Surgical History:  Procedure Laterality Date   ADENOIDECTOMY     BREAST LUMPECTOMY WITH NEEDLE LOCALIZATION Right 05/05/2013   Procedure: EXCISION RECURRENT CANCER RIGHT BREAST WITH NEEDLE LOCALOZATION;  Surgeon: Adin Hector, MD;  Location: [place];  Service: General;  Laterality: Right;   BREAST RECONSTRUCTION  06/29/2011   Procedure: BREAST RECONSTRUCTION;  Surgeon: Theodoro Kos, DO;  Location: [place];  Service: Plastics;  Laterality: Bilateral;  Immediate Bilateral Breast Reconstruction with Bilateral Tissue Expanders and placement of  Alloderm    DILATION AND CURETTAGE OF UTERUS     fibroid tumor surgery     lazy eye     corrective surgery   MASTECTOMY W/ SENTINEL NODE BIOPSY  06/29/2011   Procedure: MASTECTOMY WITH SENTINEL LYMPH NODE BIOPSY;  Surgeon: Adin Hector, MD;  Location: [place];  Service: General;  Laterality: Right;  right skin spairing mstectomy and right sentinel lymph node biopsy   PORT-A-CATH REMOVAL  03/12/2012   Procedure: MINOR REMOVAL PORT-A-CATH;  Surgeon: Adin Hector, MD;  Location: [place];  Service: General;  Laterality: N/A;  Upper left   PORTACATH PLACEMENT Left 05/05/2013   Procedure: INSERTION PORT-A-CATH;  Surgeon: Adin Hector, MD;  Location: [place];  Service: General;  Laterality: Left;   RHINOPLASTY     uterine cervical treatments  2002  for precancerous lesions   Social History   Occupational History   Not on file  Tobacco Use   Smoking status: Former    Types: Cigarettes    Quit date: 07/20/1996    Years since quitting: 24.4   Smokeless tobacco: Never  Vaping Use   Vaping Use: Not on file  Substance and Sexual Activity   Alcohol use: Not Currently    Alcohol/week: 0.0 standard drinks   Drug use: No   Sexual activity: Yes    Partners: Male

## 2020-12-31 ENCOUNTER — Telehealth: Payer: Self-pay | Admitting: Physical Medicine and Rehabilitation

## 2020-12-31 NOTE — Telephone Encounter (Signed)
Patient called. She would like Megan to call her. Her number is [phone]

## 2021-01-04 ENCOUNTER — Other Ambulatory Visit: Payer: Self-pay | Admitting: Physical Medicine and Rehabilitation

## 2021-01-04 ENCOUNTER — Encounter (HOSPITAL_BASED_OUTPATIENT_CLINIC_OR_DEPARTMENT_OTHER): Payer: Self-pay | Admitting: Orthopaedic Surgery

## 2021-01-04 MED ORDER — ACETAMINOPHEN-CODEINE #3 300-30 MG PO TABS: 1.0000 | ORAL_TABLET | Freq: Three times a day (TID) | ORAL | 0 refills | Status: DC | PRN

## 2021-01-05 ENCOUNTER — Other Ambulatory Visit: Payer: Self-pay | Admitting: Physical Medicine and Rehabilitation

## 2021-01-05 ENCOUNTER — Ambulatory Visit: Payer: 59 | Admitting: Neurology

## 2021-01-05 DIAGNOSIS — M5416 Radiculopathy, lumbar region: Secondary | ICD-10-CM

## 2021-01-05 DIAGNOSIS — G8929 Other chronic pain: Secondary | ICD-10-CM

## 2021-01-05 DIAGNOSIS — M5412 Radiculopathy, cervical region: Secondary | ICD-10-CM

## 2021-01-05 DIAGNOSIS — M546 Pain in thoracic spine: Secondary | ICD-10-CM

## 2021-01-05 DIAGNOSIS — M7918 Myalgia, other site: Secondary | ICD-10-CM

## 2021-01-07 ENCOUNTER — Other Ambulatory Visit (HOSPITAL_COMMUNITY): Payer: 59

## 2021-01-07 ENCOUNTER — Ambulatory Visit (HOSPITAL_BASED_OUTPATIENT_CLINIC_OR_DEPARTMENT_OTHER): Payer: 59 | Admitting: Physical Therapy

## 2021-01-07 ENCOUNTER — Other Ambulatory Visit: Payer: Self-pay

## 2021-01-18 ENCOUNTER — Ambulatory Visit (HOSPITAL_BASED_OUTPATIENT_CLINIC_OR_DEPARTMENT_OTHER): Payer: 59 | Admitting: Cardiology

## 2021-01-20 ENCOUNTER — Ambulatory Visit
Admission: RE | Admit: 2021-01-20 | Discharge: 2021-01-20 | Disposition: A | Discharge status: Home / Self Care | Payer: 59 | Source: Ambulatory Visit | Attending: Physical Medicine and Rehabilitation | Admitting: Physical Medicine and Rehabilitation

## 2021-01-20 ENCOUNTER — Ambulatory Visit (HOSPITAL_BASED_OUTPATIENT_CLINIC_OR_DEPARTMENT_OTHER): Payer: 59 | Attending: Orthopaedic Surgery | Admitting: Physical Therapy

## 2021-01-20 ENCOUNTER — Other Ambulatory Visit: Payer: Self-pay

## 2021-01-20 ENCOUNTER — Encounter (HOSPITAL_BASED_OUTPATIENT_CLINIC_OR_DEPARTMENT_OTHER): Payer: Self-pay | Admitting: Physical Therapy

## 2021-01-20 DIAGNOSIS — M5412 Radiculopathy, cervical region: Secondary | ICD-10-CM

## 2021-01-20 DIAGNOSIS — M542 Cervicalgia: Secondary | ICD-10-CM | POA: Diagnosis not present

## 2021-01-20 DIAGNOSIS — M545 Low back pain, unspecified: Secondary | ICD-10-CM | POA: Diagnosis present

## 2021-01-20 DIAGNOSIS — M5416 Radiculopathy, lumbar region: Secondary | ICD-10-CM

## 2021-01-20 DIAGNOSIS — G8929 Other chronic pain: Secondary | ICD-10-CM | POA: Insufficient documentation

## 2021-01-20 NOTE — Therapy (Signed)
OUTPATIENT PHYSICAL THERAPY THORACOLUMBAR EVALUATION   Patient Name: [name] MRN: [id number] DOB:07/27/1970, 50 y.o., female Today's Date: 01/21/2021    Past Medical History:  Diagnosis Date   Abdominal pain, epigastric 11/20/2019   Asthma    as a child   Cervical dysplasia    Gastroesophageal reflux disease 11/20/2019   Genetic testing 02/01/2018   Genetic testing was performed in 2015. The [employer] panel was ordered. In total, 23 genes were analyzed as part of this panel: ATM, BARD1, BRCA1, BRCA2, BRIP1, CDH1, CHEK2, EPCAM, MLH1, MRE11A, MSH2, MSH6, MUTYH, NBN, NF1, PALB2, PMS2, PTEN, RAD50, RAD51C, RAD51D, STK11, and TP53. No pathogenic variants were detected. Genetic testing did detect a Variant of Unknown Significance (VUS) at the t   GERD (gastroesophageal reflux disease)    had during chemo   Headache 06/12/2013   History of chemotherapy    History of kidney stones    History of radiation therapy 11/24/13- 01/08/14   reconstructed right breast/chest wall 4500 cGy 25 sessions, electron beam boost 1440 cGy 8 sessions   Liver masses 12/06/2016   Major depressive disorder, in remission 03/12/2017   Malignant neoplasm of lower-inner quadrant of right breast of female, estrogen receptor positive 2012   Pneumonia    PONV (postoperative nausea and vomiting)    Recurrent cancer of right breast 12/22/2013   Thrombocytopenia 05/22/2013   Past Surgical History:  Procedure Laterality Date   ADENOIDECTOMY     BREAST LUMPECTOMY WITH NEEDLE LOCALIZATION Right 05/05/2013   Procedure: EXCISION RECURRENT CANCER RIGHT BREAST WITH NEEDLE LOCALOZATION;  Surgeon: Adin Hector, MD;  Location: [place];  Service: General;  Laterality: Right;   BREAST RECONSTRUCTION  06/29/2011   Procedure: BREAST RECONSTRUCTION;  Surgeon: Theodoro Kos, DO;  Location: [place];  Service: Plastics;  Laterality: Bilateral;  Immediate Bilateral Breast Reconstruction with Bilateral Tissue Expanders and placement of   Alloderm    DILATION AND CURETTAGE OF UTERUS     fibroid tumor surgery     lazy eye     corrective surgery   MASTECTOMY W/ SENTINEL NODE BIOPSY  06/29/2011   Procedure: MASTECTOMY WITH SENTINEL LYMPH NODE BIOPSY;  Surgeon: Adin Hector, MD;  Location: [employer];  Service: General;  Laterality: Right;  right skin spairing mstectomy and right sentinel lymph node biopsy   PORT-A-CATH REMOVAL  03/12/2012   Procedure: MINOR REMOVAL PORT-A-CATH;  Surgeon: Adin Hector, MD;  Location: [place];  Service: General;  Laterality: N/A;  Upper left   PORTACATH PLACEMENT Left 05/05/2013   Procedure: INSERTION PORT-A-CATH;  Surgeon: Adin Hector, MD;  Location: [facility];  Service: General;  Laterality: Left;   RHINOPLASTY     uterine cervical treatments  2002   for precancerous lesions   Patient Active Problem List   Diagnosis Date Noted   Gastroesophageal reflux disease 11/20/2019   Abdominal pain, epigastric 11/20/2019   Major depressive disorder, in remission 03/12/2017   Liver masses 12/06/2016   Uterine cramping 01/08/2014   Recurrent cancer of right breast 12/22/2013   Family history of malignant neoplasm of ovary 07/25/2013   Family history of malignant neoplasm of breast 07/25/2013   Family history of malignant neoplasm of gastrointestinal tract 07/25/2013   Headache 06/12/2013   Nausea alone 06/12/2013   Thrombocytopenia 05/22/2013   Status post bilateral breast reconstruction 01/05/2012   Acquired absence of bilateral breasts and nipples 11/23/2011   Malignant neoplasm of lower-inner quadrant of right breast of female, estrogen receptor  positive 2012    PCP: Patient, No Pcp Per (Inactive)  REFERRING PROVIDER: Vanetta Mulders, MD  REFERRING DIAG: M54.50,G89.29 (ICD-10-CM) - Chronic low back pain, unspecified back pain laterality, unspecified whether sciatica present M54.2 (ICD-10-CM) - Cervical pain (neck)   THERAPY DIAG:  Cervicalgia  Chronic left-sided low  back pain, unspecified whether sciatica present  ONSET DATE: about 2015  SUBJECTIVE:                                                                                                                                                                                           SUBJECTIVE STATEMENT: I really started noticing it after 2nd battle with breast CA. Standing, reaching, sitting. My neck sarts hurting to look down and read, look at papers- try to prop things up to see them. Pulling feeling along bil upper traps. HA at base of skull in AM- laying down would make it worse. They seem to just be here and there now. Eventually the lower back all started hurting and sometimes it just hurts in the lower back. I do get dizzy with bending and standing back up. Not a lot of tingling into arms but sometimes I get pain into my legs with back pain.  PERTINENT HISTORY:  Dec 2015  PAIN:  Are you having pain? Yes VAS scale: 5/10- neck; 7/10 Pain location: bil neck & midline, lower back across Aggravating factors: neck- looking down; back- bending over, standing/sitting long periods Relieving factors: sit down, ice for HA  PRECAUTIONS: None  WEIGHT BEARING RESTRICTIONS No  FALLS:  Has patient fallen in last 6 months? No,   LIVING ENVIRONMENT: Lives with: lives with their spouse and lives with their son Lives in: House/apartment Stairs: Yes; two story home Has following equipment at home: None  OCCUPATION: not working  PLOF: Independent  PATIENT GOALS decr pain, decr HA, bending, reading, lifting, standing(can be completely limited)   OBJECTIVE:   DIAGNOSTIC FINDINGS:  Cervical & lumbar MRI 10/13  PATIENT SURVEYS:  ODI: 17 NDI: 21   COGNITION:  Overall cognitive status: Within functional limits for tasks assessed     SENSATION:  Light touch: occasional N/T in hands, pain into legs when back hurts    POSTURE:  Slouched posture in seated- able to obtain upright position but trunk  is anteriorly placed over pelvis, bil scapular winging, bil shoulder elevation and forward rounded.   LUMBARAROM/PROM  A/PROM A/PROM  01/21/2021  Flexion   Extension   Right lateral flexion   Left lateral flexion   Right rotation   Left rotation    (Blank rows = not tested)  LE AROM/PROM:  A/PROM Right 01/21/2021 Left 01/21/2021  Hip flexion    Hip extension    Hip abduction    Hip adduction    Hip internal rotation    Hip external rotation    Knee flexion    Knee extension    Ankle dorsiflexion    Ankle plantarflexion    Ankle inversion    Ankle eversion     (Blank rows = not tested)  LE MMT:  MMT Right 01/21/2021 Left 01/21/2021  Hip flexion    Hip extension    Hip abduction    Hip adduction    Hip internal rotation    Hip external rotation    Knee flexion    Knee extension    Ankle dorsiflexion    Ankle plantarflexion    Ankle inversion    Ankle eversion     (Blank rows = not tested)   GAIT: Pattern WFL, limited endurance due to pain    TODAY'S TREATMENT  Correct postural alignment Scap retraction  Upper trap & levator stretch Lumbar flexion stretch in seated   PATIENT EDUCATION:  Education details: Anatomy of condition, POC, HEP, exercise form/rationale, aquatics  Person educated: Patient Education method: Consulting civil engineer, Media planner, Corporate treasurer cues, Verbal cues, and Handouts Education comprehension: verbalized understanding, returned demonstration, verbal cues required, tactile cues required, and needs further education   HOME EXERCISE PROGRAM: [id number]  ASSESSMENT:  CLINICAL IMPRESSION: Patient is a 51 y.o. F who was seen today for physical therapy evaluation and treatment for worsening cervical and lumbar pain. Objective impairments include decreased activity tolerance, decreased endurance, difficulty walking, decreased strength, increased muscle spasms, impaired flexibility, impaired UE functional use, improper body mechanics,  postural dysfunction, and pain. These impairments are limiting patient from cleaning, community activity, meal prep, and personal finances. Personal factors including 1-2 comorbidities: h/o breast CA x2, bil breast reconstruction, cervical dysplasia  are also affecting patient's functional outcome. Patient will benefit from skilled PT to address above impairments and improve overall function. We will work in the pool for the first 4 weeks of treatment to decrease pain with exercise and gain tolerance to progress to land-based exercises. Pt c/o symptoms consistent with postural hypotension and will monitor with exercise.   REHAB POTENTIAL: Good  CLINICAL DECISION MAKING: Evolving/moderate complexity  EVALUATION COMPLEXITY: Moderate   GOALS: Goals reviewed with patient? Yes  SHORT TERM GOALS:  STG Name Target Date Goal status  1 Pt will be able to demo proper resting postural alignment Baseline: today worked on scap retraction & moving weight over pelvis 02/11/2021 INITIAL  2 Pt will be independent in daily stretching program  Baseline: will establish as appropriate 02/11/2021 INITIAL                            LONG TERM GOALS:   LTG Name Target Date Goal status  1 ODI to improve my MDC of 12 points Baseline: scored 17 points at eval 03/18/2021 INITIAL  2 NDI to improve by MDC of 10 points Baseline: scored 21 points at eval 03/18/2021 INITIAL  3 Pt will be able to bend from a standing position to reach objects on floor with minimal to no discomfort Baseline:very limited at eval 03/18/2021 INITIAL  4 Pt will be able to stand to wash dishes  Baseline: will work on recognition of necessary postural changes and breaks to improve endurance 03/18/2021 INITIAL  5 Pt will be independent in long term exercise to continue advancing strength Baseline:will  progress as appropriate 03/18/2021 INITIAL             PLAN: PT FREQUENCY: 2x/week  PT DURATION: 8 weeks  PLANNED INTERVENTIONS:  Therapeutic exercises, Therapeutic activity, Neuro Muscular re-education, Balance training, Gait training, Patient/Family education, Joint mobilization, Stair training, Aquatic Therapy, Dry Needling, Spinal mobilization, Cryotherapy, Moist heat, and Manual therapy  PLAN FOR NEXT SESSION: begin pool- anterior chest flexibility, periscap activation; core engagement to decrease lumbar spasm, hip hinge/bending tolerance   Raphel Stickles C. Bonnee Zertuche PT, DPT 01/21/21 8:07 PM

## 2021-01-21 ENCOUNTER — Encounter (HOSPITAL_BASED_OUTPATIENT_CLINIC_OR_DEPARTMENT_OTHER): Payer: Self-pay | Admitting: Physical Therapy

## 2021-01-24 NOTE — Progress Notes (Deleted)
NEUROLOGY FOLLOW UP OFFICE NOTE  [name] [id number]  Assessment/Plan:   Chronic migraine without aura, without status migrainosus, not intractable  Migraine prevention:  *** Migraine rescue:  *** Limit use of pain relievers to no more than 2 days out of week to prevent risk of rebound or medication-overuse headache. Keep headache diary Follow up ***   Subjective:  [name] is a 50 year old right-handed female with history of breast cancer s/p bilateral mastectomies, radiation and chemotherapy who follows up for migraines and memory deficits.   UPDATE: Started venlafaxine in April. Advised to discontinue Excedrin.  Roselyn Meier? rizatriptan    Current NSAIDS:  none Current analgesics:  none Current triptans:  rizatriptan 56m Current ergotamine:  none Current anti-emetic:  none Current muscle relaxants:  none Current anti-anxiolytic:  none Current sleep aide:  none Current Antihypertensive medications:  none Current Antidepressant medications:  venlafaxine XR 772mdaily Current Anticonvulsant medications:  none Current anti-CGRP:  Ubrelvy 10088murrent Vitamins/Herbal/Supplements:  Co-Q10 150m13mily, D, biotin, turmeric curcumin Current Antihistamines/Decongestants:  none Other therapy:  Ice pack on back of neck and over eyes. Hormone/birth control:  Vitafol Fe   Caffeine:  No coffee. Mt Dew rarely Diet:  Drinks plenty of water.  Not a big eater.  Eats protein bars, fruits and vegetables.  Sometimes a doughnut. Exercise:  Active but not routine Mood:  Does not feel depressed but not as happy as she used to be. Other pain:  Low back pain Sleep hygiene:  She thinks she sleeps well but has excessive daytime sleepiness.  She was referred to sleep medicine ***   HISTORY:  She has had headaches since her early 40s.46s  She has history of recurrent breast cancer (2012 and 2015) status post bilateral mastectomies, radiating and chemotherapy.  She is  currently in remission without evidence of disease.  She developed severe headaches during her treatment in 2015.  She has had headaches since then.  Often, she wakes up with pressure-like headaches (pulsating in temples).  Sometimes they may occur later in the day.  Sometimes they are moderate if they occur later in the day, but otherwise severe, either at base of her skull radiating up to the front and in the temples.  No neck pain.  She has associated photophobia, phonophobia, rarely nausea, no associated visual disturbance.  They last usually a couple of hours but may last 1/2 a day.  Severe headaches occur about 10 days a months.  She takes Excedrin daily, which helps.  No specific trigger.  She does report degenerative disc disease in her cervical spine.  Changing pillows ineffective.  MRI of brain with and without contrast on 03/29/2016 personally reviewed and was unremarkable.  CT soft tissue of neck from 12/29/15 showed C6-7 disc degeneration with severe left neural foraminal stenosis.  She has episodes of dizziness when she is bent over and then stands up.  She also reports short term memory problems.  She reports daytime fatigue and endorses that she is a "mouth breather".  TSH was 0.952.  MRI of brain with and without contrast on 12/22/2019 was normal.  Neuropsychological testing in October 2021 demonstrated no cognitive deficits.   Past NSAIDS:  Ibuprofen, naproxen Past analgesics:  Tylenol Past abortive triptans:  none Past abortive ergotamine:  none Past muscle relaxants:  none Past anti-emetic:  none Past antihypertensive medications:  none Past antidepressant medications:  none Past anticonvulsant medications:  none Past anti-CGRP:  none Past  vitamins/Herbal/Supplements:  none Past antihistamines/decongestants:  none Other past therapies:  none  PAST MEDICAL HISTORY: Past Medical History:  Diagnosis Date   Abdominal pain, epigastric 11/20/2019   Asthma    as a child   Cervical  dysplasia    Gastroesophageal reflux disease 11/20/2019   Genetic testing 02/01/2018   Genetic testing was performed in 2015. The [employer] O[employer]Next panel was ordered. In total, 23 genes were analyzed as part of this panel: ATM, BARD1, BRCA1, BRCA2, BRIP1, CDH1, CHEK2, EPCAM, MLH1, MRE11A, MSH2, MSH6, MUTYH, NBN, NF1, PALB2, PMS2, PTEN, RAD50, RAD51C, RAD51D, STK11, and TP53. No pathogenic variants were detected. Genetic testing did detect a Variant of Unknown Significance (VUS) at the t   GERD (gastroesophageal reflux disease)    had during chemo   Headache 06/12/2013   History of chemotherapy    History of kidney stones    History of radiation therapy 11/24/13- 01/08/14   reconstructed right breast/chest wall 4500 cGy 25 sessions, electron beam boost 1440 cGy 8 sessions   Liver masses 12/06/2016   Major depressive disorder, in remission 03/12/2017   Malignant neoplasm of lower-inner quadrant of right breast of female, estrogen receptor positive 2012   Pneumonia    PONV (postoperative nausea and vomiting)    Recurrent cancer of right breast 12/22/2013   Thrombocytopenia 05/22/2013    MEDICATIONS: Current Outpatient Medications on File Prior to Visit  Medication Sig Dispense Refill   acetaminophen-codeine (TYLENOL #3) 300-30 MG tablet Take 1 tablet by mouth every 8 (eight) hours as needed for moderate pain. 10 tablet 0   aspirin-acetaminophen-caffeine (EXCEDRIN MIGRAINE) 063-016-01 MG tablet Take by mouth every 6 (six) hours as needed for headache.     Biotin 5000 MCG CAPS Take 5,000 mcg by mouth daily.     Black Pepper-Turmeric (TURMERIC CURCUMIN) 08-998 MG CAPS Take by mouth 2 (two) times daily.     celecoxib (CELEBREX) 200 MG capsule Take 1 capsule (200 mg total) by mouth 2 (two) times daily. 30 capsule 3   Cholecalciferol (VITAMIN D3) 125 MCG (5000 UT) TBDP Take 1 tablet by mouth daily.     Coenzyme Q10 (COQ10) 150 MG CAPS Take 1 capsule by mouth once.     cyclobenzaprine (FLEXERIL) 10 MG tablet  Take 1 tablet (10 mg total) by mouth every 8 (eight) hours as needed for muscle spasms. 30 tablet 2   finasteride (PROSCAR) 5 MG tablet Take 5 mg by mouth daily. 1/2 tablet daily     ibuprofen (ADVIL) 800 MG tablet Take 1 tablet (800 mg total) by mouth every 8 (eight) hours as needed. 30 tablet 5   pantoprazole (PROTONIX) 40 MG tablet Take 1 tablet (40 mg total) by mouth daily. 30 tablet 11   Prenat-Fe Poly-Methfol-FA-DHA (VITAFOL FE+) 90-0.6-0.4-200 MG CAPS Take 1 tablet by mouth daily. 30 capsule 11   sucralfate (CARAFATE) 1 g tablet Take 1 tablet (1 g total) by mouth 4 (four) times daily -  with meals and at bedtime. 120 tablet 1   No current facility-administered medications on file prior to visit.    ALLERGIES: Allergies  Allergen Reactions   Tussionex Pennkinetic Er [Hydrocod Polst-Cpm Polst Er] Other (See Comments)    hallucinations   Morphine Rash   Morphine And Related Anxiety    anxiety   Penicillin G Rash   Penicillins Rash    FAMILY HISTORY: Family History  Adopted: Yes  Problem Relation Age of Onset   Heart attack Father    Cancer Cousin 43  melanoma; mat first cousin, located on neck   Colon cancer Mother 99   Rectal cancer Mother    Cancer Maternal Aunt 60       stomach and ovarian as separate primaries   Cancer Maternal Uncle 9       lung; smoker   Cancer Maternal Uncle 63       prostate   Cancer Maternal Grandmother 75       enodometrial   Cancer Maternal Aunt 55       breast   Stomach cancer Maternal Aunt       Objective:  *** General: No acute distress.  Patient appears ***-groomed.   Head:  Normocephalic/atraumatic Eyes:  Fundi examined but not visualized Neck: supple, no paraspinal tenderness, full range of motion Heart:  Regular rate and rhythm Lungs:  Clear to auscultation bilaterally Back: No paraspinal tenderness Neurological Exam: alert and oriented to person, place, and time.  Speech fluent and not dysarthric, language intact.  CN  II-XII intact. Bulk and tone normal, muscle strength 5/5 throughout.  Sensation to light touch intact.  Deep tendon reflexes 2+ throughout, toes downgoing.  Finger to nose testing intact.  Gait normal, Romberg negative.   Metta Clines, DO  CC: ***

## 2021-01-25 ENCOUNTER — Ambulatory Visit: Payer: 59 | Admitting: Neurology

## 2021-01-26 ENCOUNTER — Encounter: Payer: Self-pay | Admitting: Physical Medicine and Rehabilitation

## 2021-01-26 ENCOUNTER — Ambulatory Visit (HOSPITAL_COMMUNITY): Payer: 59 | Attending: Cardiology

## 2021-01-26 ENCOUNTER — Other Ambulatory Visit: Payer: Self-pay

## 2021-01-26 ENCOUNTER — Ambulatory Visit (INDEPENDENT_AMBULATORY_CARE_PROVIDER_SITE_OTHER): Payer: 59 | Admitting: Physical Medicine and Rehabilitation

## 2021-01-26 ENCOUNTER — Ambulatory Visit (INDEPENDENT_AMBULATORY_CARE_PROVIDER_SITE_OTHER)
Admission: RE | Admit: 2021-01-26 | Discharge: 2021-01-26 | Disposition: A | Discharge status: Home / Self Care | Payer: Self-pay | Source: Ambulatory Visit | Attending: Cardiology | Admitting: Cardiology

## 2021-01-26 VITALS — BP 100/67 | HR 68

## 2021-01-26 DIAGNOSIS — I700 Atherosclerosis of aorta: Secondary | ICD-10-CM

## 2021-01-26 DIAGNOSIS — M5412 Radiculopathy, cervical region: Secondary | ICD-10-CM

## 2021-01-26 DIAGNOSIS — M5416 Radiculopathy, lumbar region: Secondary | ICD-10-CM

## 2021-01-26 DIAGNOSIS — M7918 Myalgia, other site: Secondary | ICD-10-CM | POA: Diagnosis not present

## 2021-01-26 DIAGNOSIS — Z9221 Personal history of antineoplastic chemotherapy: Secondary | ICD-10-CM | POA: Diagnosis present

## 2021-01-26 LAB — ECHOCARDIOGRAM COMPLETE
Area-P 1/2: 2.75 cm2
S' Lateral: 3.2 cm

## 2021-01-26 MED ORDER — METHOCARBAMOL 500 MG PO TABS: 500.0000 mg | ORAL_TABLET | Freq: Three times a day (TID) | ORAL | 0 refills | Status: DC

## 2021-01-26 NOTE — Progress Notes (Signed)
Pt state neck and back pain. Pt state standing and bending makes the pain worse. Pt state pain meds and heating to help ease her pain.  Numeric Pain Rating Scale and Functional Assessment Average Pain 10 Pain Right Now 8 My pain is intermittent, constant, burning, dull, and aching Pain is worse with: walking, bending, standing, and some activites Pain improves with: heat/ice and medication   In the last MONTH (on 0-10 scale) has pain interfered with the following?  1. General activity like being  able to carry out your everyday physical activities such as walking, climbing stairs, carrying groceries, or moving a chair?  Rating(7)  2. Relation with others like being able to carry out your usual social activities and roles such as  activities at home, at work and in your community. Rating(8)  3. Enjoyment of life such that you have  been bothered by emotional problems such as feeling anxious, depressed or irritable?  Rating(9)

## 2021-01-26 NOTE — Progress Notes (Signed)
[name] - 50 y.o. female MRN [id number]  Date of birth: [birth year]  Office Visit Note: Visit Date: 01/26/2021 PCP: Patient, No Pcp Per (Inactive) Referred by: No ref. provider found  Subjective: Chief Complaint  Patient presents with   Neck - Pain   Middle Back - Pain   Lower Back - Pain   HPI: [name] is a 50 y.o. female who comes in today For evaluation of 2 ongoing issues.  Chronic, worsening and severe bilateral neck pain radiating to bilateral arms, left greater than right.  Patient also reports chronic, worsening and severe bilateral lower back pain radiating to legs. Patient reports bilateral neck pain is exacerbated by movement and activity, describes as a soreness and aching sensation, currently rates as 7 out of 10.  Patient's recent cervical MRI exhibits multilevel foraminal stenosis most severe on the left at C6-C7 secondary to a disc protrusion.  This was reviewed with the patient along with spine models and is reviewed in detail below in the note.  No high-grade spinal canal stenosis noted.  Patient is currently attending in-house physical therapy for cervical spine issues as well as myofascial pain that radiates to shoulders and thoracic spine.  Patient does endorse her neck pain is the most severe pain for her at this current time and is also requesting an epidural injection if warranted.  Patient continues to have severe bilateral lower back pain radiating to her legs that is exacerbated by bending, walking and prolonged standing.  Patient describes pain as a sharp and shooting sensation, currently rates a 6 out of 10.  Patient's recent lumbar MRI exhibits multilevel facet hypertrophy and a broad-based disc protrusion on the right at L4-L5.  No high-grade spinal canal stenosis noted.  Patient is also attending in-house physical therapy for her lumbar spine issues and lower back myofascial pain.  Patient states she has an upcoming appointment with Dr. Metta Clines  to address her chronic migraine issues.   Patient has a significant medical history of metastatic breast cancer, chronic migraine headaches, and major depressive disorder. Patient is currently being managed by Dr. Lurline Del with [facility], patient states she is currently in remission.  Patient denies focal weakness.  Patient denies recent trauma or falls.  Review of Systems  Musculoskeletal:  Positive for back pain, myalgias and neck pain.  Neurological:  Negative for tingling, sensory change, focal weakness and weakness.  All other systems reviewed and are negative. Otherwise per HPI.  Assessment & Plan: Visit Diagnoses:    ICD-10-CM   1. Radiculopathy, cervical region  M54.12 Ambulatory referral to Physical Medicine Rehab    2. Lumbar radiculopathy  M54.16 Ambulatory referral to Physical Medicine Rehab    3. Myofascial pain syndrome  M79.18        Plan: Findings:  Chronic, worsening and severe bilateral neck pain radiating to bilateral arms, left greater than right.  Patient continues to have excruciating pain despite good conservative therapies such as rest and medications.  We did spend a considerable amount of time reviewing her recent cervical MRI with her today using images and spine model.  She has recently started in-house physical therapy to address her chronic neck issues.  Patient's clinical presentation and exam are consistent somewhat with classic C6 nerve pattern.  We also believe the patient's chronic neck pain does have a myofascial component as well.  She likely has underlying central sensitization pain syndrome as well.  We believe the next step is to  perform a diagnostic and hopefully therapeutic left C7-T1 interlaminar epidural steroid injection under fluoroscopic guidance.  Chronic, worsening and severe bilateral lower back pain radiating to legs.  Patient continues to have severe pain despite good conservative therapies such as formal physical therapy,  rest and medications.  We did review her recent lumbar MRI today in detail using images and spine model.  We feel the next step is to perform a diagnostic and hopefully therapeutic bilateral L4 transforaminal epidural steroid injection under fluoroscopic guidance.  Patient encouraged to continue physical therapy to address chronic neck and back issues as there could also be a myofascial component to these as well as a central sensitization syndrome such as fibromyalgia.  Patient is inquiring today about a muscle relaxer to help her sleep at night.  We did write her first prescription for Robaxin and we will follow-up with her to see how she tolerates this medication.  Patient also inquiring about possible chiropractor treatment for her lower back, however we feel at this time that it would benefit her more to complete physical therapy and we can revisit this if she continues to have pain after this treatment is completed.  She can see a chiropractor on her own if she would like.  Patient encouraged to follow-up with Dr.Adam Tomi Likens at [facility] for management of chronic migraines as we do not think these headaches are being caused by her cervical spine issues.  No red flag symptoms noted upon exam today.   Meds & Orders:  Meds ordered this encounter  Medications   methocarbamol (ROBAXIN) 500 MG tablet    Sig: Take 1 tablet (500 mg total) by mouth 3 (three) times daily.    Dispense:  90 tablet    Refill:  0    Order Specific Question:   Supervising Provider    Answer:   Magnus Sinning [[id number]]    Orders Placed This Encounter  Procedures   Ambulatory referral to Physical Medicine Rehab    Follow-up: Return in about 1 week (around 02/02/2021) for Left C7-T1 interlaminar epidural steroid injection.   Procedures: No procedures performed      Clinical History: EXAM: MRI CERVICAL SPINE WITHOUT CONTRAST   TECHNIQUE: Multiplanar, multisequence MR imaging of the cervical spine  was performed. No intravenous contrast was administered.   COMPARISON:  Cervical spine radiographs 12/27/2020   FINDINGS: Alignment: Slight retrolisthesis is again noted at C6-7. No other significant listhesis is present. Straightening of the normal cervical lordosis is present.   Vertebrae: Mild endplate changes are noted at C6-7. Marrow signal and vertebral body heights are otherwise normal.   Cord: Vocal cords are midline and symmetric. Trachea is clear.   Posterior Fossa, vertebral arteries, paraspinal tissues: Craniocervical junction is normal. Flow is present in the vertebral arteries bilaterally. Visualized intracranial contents are normal.   Disc levels:   C2-3: Negative.   C3-4: Mild disc bulging and left-sided uncovertebral spurring is present without significant stenosis.   C4-5: Minimal disc bulging is present. No significant stenosis is present.   C5-6: Mild disc bulging is present. Minimal uncovertebral spurring is noted on the left. No significant stenosis is present.   C6-7: A leftward disc osteophyte complex is present. Uncovertebral spurring and disc protrusion result in severe left foraminal stenosis. Partial effacement of the ventral CSF is noted. Mild right foraminal narrowing is evident.   C7-T1: Negative.   IMPRESSION: 1. Severe left foraminal stenosis at C6-7 secondary to a leftward disc osteophyte complex and uncovertebral spurring. This  likely impacts the left C7 nerve roots. 2. Mild right foraminal narrowing at C6-7. 3. Mild disc bulging and left-sided uncovertebral spurring at C3-4 and C4-5 without significant stenosis.     Electronically Signed   By: San Morelle M.D.   On: 01/23/2021 07:31   She reports that she quit smoking about 24 years ago. Her smoking use included cigarettes. She has never used smokeless tobacco. No results for input(s): HGBA1C, LABURIC in the last 8760 hours.  Objective:  VS:  HT:    WT:   BMI:      BP:100/67  HR:68bpm  TEMP: ( )  RESP:  Physical Exam Vitals and nursing note reviewed.  HENT:     Head: Normocephalic.     Right Ear: External ear normal.     Left Ear: External ear normal.     Nose: Nose normal.     Mouth/Throat:     Mouth: Mucous membranes are dry.  Eyes:     Extraocular Movements: Extraocular movements intact.  Cardiovascular:     Rate and Rhythm: Normal rate.     Pulses: Normal pulses.  Pulmonary:     Effort: Pulmonary effort is normal.  Abdominal:     General: Abdomen is flat. There is no distension.  Musculoskeletal:        General: Tenderness present.     Cervical back: Tenderness present.     Comments: Discomfort noted with flexion, extension and side-to-side rotation. Good strength noted to bilateral upper extremities. Sensation intact bilaterally. Negative Hoffman's sign.    Pt rises from seated position to standing without difficulty. Good lumbar range of motion. Strong distal strength without clonus, no pain upon palpation of greater trochanters. Sensation intact bilaterally. Dysesthesias noted to bilateral L5 dermatome. Walks independently, gait steady.     Skin:    General: Skin is warm and dry.     Capillary Refill: Capillary refill takes less than 2 seconds.  Neurological:     General: No focal deficit present.     Mental Status: She is alert.  Psychiatric:        Mood and Affect: Mood normal.    Ortho Exam  Imaging:   Past Medical/Family/Surgical/Social History: Medications & Allergies reviewed per EMR, new medications updated. Patient Active Problem List   Diagnosis Date Noted   Gastroesophageal reflux disease 11/20/2019   Abdominal pain, epigastric 11/20/2019   Major depressive disorder, in remission 03/12/2017   Liver masses 12/06/2016   Uterine cramping 01/08/2014   Recurrent cancer of right breast 12/22/2013   Family history of malignant neoplasm of ovary 07/25/2013   Family history of malignant neoplasm of breast 07/25/2013    Family history of malignant neoplasm of gastrointestinal tract 07/25/2013   Headache 06/12/2013   Nausea alone 06/12/2013   Thrombocytopenia 05/22/2013   Status post bilateral breast reconstruction 01/05/2012   Acquired absence of bilateral breasts and nipples 11/23/2011   Malignant neoplasm of lower-inner quadrant of right breast of female, estrogen receptor positive 2012   Past Medical History:  Diagnosis Date   Abdominal pain, epigastric 11/20/2019   Asthma    as a child   Cervical dysplasia    Gastroesophageal reflux disease 11/20/2019   Genetic testing 02/01/2018   Genetic testing was performed in 2015. The [employer] Ova[employer] panel was ordered. In total, 23 genes were analyzed as part of this panel: ATM, BARD1, BRCA1, BRCA2, BRIP1, CDH1, CHEK2, EPCAM, MLH1, MRE11A, MSH2, MSH6, MUTYH, NBN, NF1, PALB2, PMS2, PTEN, RAD50, RAD51C, RAD51D, STK11, and  TP53. No pathogenic variants were detected. Genetic testing did detect a Variant of Unknown Significance (VUS) at the t   GERD (gastroesophageal reflux disease)    had during chemo   Headache 06/12/2013   History of chemotherapy    History of kidney stones    History of radiation therapy 11/24/13- 01/08/14   reconstructed right breast/chest wall 4500 cGy 25 sessions, electron beam boost 1440 cGy 8 sessions   Liver masses 12/06/2016   Major depressive disorder, in remission 03/12/2017   Malignant neoplasm of lower-inner quadrant of right breast of female, estrogen receptor positive 2012   Pneumonia    PONV (postoperative nausea and vomiting)    Recurrent cancer of right breast 12/22/2013   Thrombocytopenia 05/22/2013   Family History  Adopted: Yes  Problem Relation Age of Onset   Heart attack Father    Cancer Cousin 46       melanoma; mat first cousin, located on neck   Colon cancer Mother 52   Rectal cancer Mother    Cancer Maternal Aunt 60       stomach and ovarian as separate primaries   Cancer Maternal Uncle 65       lung; smoker    Cancer Maternal Uncle 63       prostate   Cancer Maternal Grandmother 75       enodometrial   Cancer Maternal Aunt 55       breast   Stomach cancer Maternal Aunt    Past Surgical History:  Procedure Laterality Date   ADENOIDECTOMY     BREAST LUMPECTOMY WITH NEEDLE LOCALIZATION Right 05/05/2013   Procedure: EXCISION RECURRENT CANCER RIGHT BREAST WITH NEEDLE LOCALOZATION;  Surgeon: Adin Hector, MD;  Location: [place];  Service: General;  Laterality: Right;   BREAST RECONSTRUCTION  06/29/2011   Procedure: BREAST RECONSTRUCTION;  Surgeon: Theodoro Kos, DO;  Location: [place];  Service: Plastics;  Laterality: Bilateral;  Immediate Bilateral Breast Reconstruction with Bilateral Tissue Expanders and placement of  Alloderm    DILATION AND CURETTAGE OF UTERUS     fibroid tumor surgery     lazy eye     corrective surgery   MASTECTOMY W/ SENTINEL NODE BIOPSY  06/29/2011   Procedure: MASTECTOMY WITH SENTINEL LYMPH NODE BIOPSY;  Surgeon: Adin Hector, MD;  Location: [place];  Service: General;  Laterality: Right;  right skin spairing mstectomy and right sentinel lymph node biopsy   PORT-A-CATH REMOVAL  03/12/2012   Procedure: MINOR REMOVAL PORT-A-CATH;  Surgeon: Adin Hector, MD;  Location: [place];  Service: General;  Laterality: N/A;  Upper left   PORTACATH PLACEMENT Left 05/05/2013   Procedure: INSERTION PORT-A-CATH;  Surgeon: Adin Hector, MD;  Location: [place];  Service: General;  Laterality: Left;   RHINOPLASTY     uterine cervical treatments  2002   for precancerous lesions   Social History   Occupational History   Not on file  Tobacco Use   Smoking status: Former    Types: Cigarettes    Quit date: 07/20/1996    Years since quitting: 24.5   Smokeless tobacco: Never  Vaping Use   Vaping Use: Not on file  Substance and Sexual Activity   Alcohol use: Not Currently    Alcohol/week: 0.0 standard drinks   Drug use: No   Sexual activity: Yes    Partners: Male

## 2021-01-26 NOTE — Telephone Encounter (Signed)
Please let her know that I would like her to complete physical therapy before we move on to chiropractor treatments. If she does not get good relief with PT I am happy to give referral to chiropractor for her lower back issues.

## 2021-01-27 ENCOUNTER — Encounter (HOSPITAL_BASED_OUTPATIENT_CLINIC_OR_DEPARTMENT_OTHER): Payer: Self-pay

## 2021-01-31 ENCOUNTER — Telehealth: Payer: Self-pay | Admitting: Physical Medicine and Rehabilitation

## 2021-01-31 ENCOUNTER — Other Ambulatory Visit: Payer: Self-pay | Admitting: Physical Medicine and Rehabilitation

## 2021-01-31 MED ORDER — DIAZEPAM 5 MG PO TABS: ORAL_TABLET | ORAL | 0 refills | Status: DC

## 2021-01-31 NOTE — Telephone Encounter (Signed)
Pt states she got a call from shena in [place]. Please call her back.   CB [phone]

## 2021-01-31 NOTE — Progress Notes (Signed)
NEUROLOGY FOLLOW UP OFFICE NOTE  [name] [id number]  Assessment/Plan:   1  Chronic migraine without aura, without status migrainosus, not intractable - probably cervicogenic 2  Cervical radiculopathy 3  Excessive daytime sleepiness with morning headaches - consider obstructive sleep apnea.  For neck pain/radiculopathy, will refer her to Dr. Hulan [facility]as [facility] for alternative treatments other than an epidural (as she has some reservations about it). Migraine rescue:  Ubrelvy 118m Refer to sleep medicine for evaluation of OSA. Limit use of pain relievers to no more than 2 days out of week to prevent risk of rebound or medication-overuse headache. Keep headache diary Follow up 6 months.   Subjective:  Judith Sifuentesis a 50year old right-handed female with history of breast cancer s/p bilateral mastectomies, radiation and chemotherapy who follows up for migraines and memory deficits.   UPDATE: Started venlafaxine in April but stopped due to side effects.  Rizatriptan caused side effects.  [name] helpful.    Intensity:  Severe Duration:  2 hours with URoselyn MeierFrequency:  2 days a week  She began experiencing pain and numbness into the left shoulder, arm and hand.  MRI of cervical spine on 01/23/2021 personally reviewed showed leftward disc osteophyte complex and spurring causing severe left foraminal stenosis at C6-7 as well as mild disc bulging and spurring at C3-4 and C4-5 without significant stenosis.  She is starting physical therapy today and is scheduled for an epidural injection but is having second thoughts about having it done.    Current NSAIDS:  ibuprofen 8059m Celebrex, Tylenol #3, Excedrin (rare) Current analgesics:  none Current triptans:  rizatriptan 105murrent ergotamine:  none Current anti-emetic:  none Current muscle relaxants:  Robaxin 500m5mD, Flexeril 10mg41m PRN Current anti-anxiolytic:  none Current sleep aide:   none Current Antihypertensive medications:  none Current Antidepressant medications:  none Current Anticonvulsant medications:  none Current anti-CGRP:  Ubrelvy 100mg 41ment Vitamins/Herbal/Supplements:  Co-Q10 150mg d1m, D, biotin, turmeric curcumin Current Antihistamines/Decongestants:  none Other therapy:  Ice pack on back of neck and over eyes. Hormone/birth control:  Vitafol Fe   Caffeine:  No coffee. Mt Dew rarely Diet:  Drinks plenty of water.  Not a big eater.  Eats protein bars, fruits and vegetables.  Sometimes a doughnut. Exercise:  Active but not routine Mood:  Does not feel depressed but not as happy as she used to be. Other pain:  Low back pain Sleep hygiene:  She thinks she sleeps well but has excessive daytime sleepiness.  Her husband says she snores.  A few times, she wakes up gasping for air.  She was referred to sleep medicine but nobody contacted her.     HISTORY:  She has had headaches since her early 40s.   88she has history of recurrent breast cancer (2012 and 2015) status post bilateral mastectomies, radiating and chemotherapy.  She is currently in remission without evidence of disease.  She developed severe headaches during her treatment in 2015.  She has had headaches since then.  Often, she wakes up with pressure-like headaches (pulsating in temples).  Sometimes they may occur later in the day.  Sometimes they are moderate if they occur later in the day, but otherwise severe, either at base of her skull radiating up to the front and in the temples.  No neck pain.  She has associated photophobia, phonophobia, rarely nausea, no associated visual disturbance.  They last usually a couple of hours  but may last 1/2 a day.  Severe headaches occur about 10 days a months.  She takes Excedrin daily, which helps.  No specific trigger.  She does report degenerative disc disease in her cervical spine.  Changing pillows ineffective.  MRI of brain with and without contrast on  03/29/2016 personally reviewed and was unremarkable.  CT soft tissue of neck from 12/29/15 showed C6-7 disc degeneration with severe left neural foraminal stenosis.  She has episodes of dizziness when she is bent over and then stands up.  She also reports short term memory problems.  She reports daytime fatigue and endorses that she is a "mouth breather".  TSH was 0.952.  MRI of brain with and without contrast on 12/22/2019 was normal.  Neuropsychological testing in October 2021 demonstrated no cognitive deficits.   Past NSAIDS:  Ibuprofen, naproxen Past analgesics:  Tylenol Past abortive triptans:  rizatriptan 96m (upset stomach) Past abortive ergotamine:  none Past muscle relaxants:  none Past anti-emetic:  none Past antihypertensive medications:  none Past antidepressant medications:  venlafaxine XR 763m(side effects) Past anticonvulsant medications:  gabapentin Past anti-CGRP:  none Past vitamins/Herbal/Supplements:  none Past antihistamines/decongestants:  none Other past therapies:  none  PAST MEDICAL HISTORY: Past Medical History:  Diagnosis Date   Abdominal pain, epigastric 11/20/2019   Asthma    as a child   Cervical dysplasia    Gastroesophageal reflux disease 11/20/2019   Genetic testing 02/01/2018   Genetic testing was performed in 2015. The [employer] panel was ordered. In total, 23 genes were analyzed as part of this panel: ATM, BARD1, BRCA1, BRCA2, BRIP1, CDH1, CHEK2, EPCAM, MLH1, MRE11A, MSH2, MSH6, MUTYH, NBN, NF1, PALB2, PMS2, PTEN, RAD50, RAD51C, RAD51D, STK11, and TP53. No pathogenic variants were detected. Genetic testing did detect a Variant of Unknown Significance (VUS) at the t   GERD (gastroesophageal reflux disease)    had during chemo   Headache 06/12/2013   History of chemotherapy    History of kidney stones    History of radiation therapy 11/24/13- 01/08/14   reconstructed right breast/chest wall 4500 cGy 25 sessions, electron beam boost 1440 cGy 8 sessions    Liver masses 12/06/2016   Major depressive disorder, in remission 03/12/2017   Malignant neoplasm of lower-inner quadrant of right breast of female, estrogen receptor positive 2012   Pneumonia    PONV (postoperative nausea and vomiting)    Recurrent cancer of right breast 12/22/2013   Thrombocytopenia 05/22/2013    MEDICATIONS: Current Outpatient Medications on File Prior to Visit  Medication Sig Dispense Refill   acetaminophen-codeine (TYLENOL #3) 300-30 MG tablet Take 1 tablet by mouth every 8 (eight) hours as needed for moderate pain. 10 tablet 0   aspirin-acetaminophen-caffeine (EXCEDRIN MIGRAINE) 25268-341-96G tablet Take by mouth every 6 (six) hours as needed for headache.     Biotin 5000 MCG CAPS Take 5,000 mcg by mouth daily.     Black Pepper-Turmeric (TURMERIC CURCUMIN) 08-998 MG CAPS Take by mouth 2 (two) times daily.     celecoxib (CELEBREX) 200 MG capsule Take 1 capsule (200 mg total) by mouth 2 (two) times daily. 30 capsule 3   Cholecalciferol (VITAMIN D3) 125 MCG (5000 UT) TBDP Take 1 tablet by mouth daily.     Coenzyme Q10 (COQ10) 150 MG CAPS Take 1 capsule by mouth once.     cyclobenzaprine (FLEXERIL) 10 MG tablet Take 1 tablet (10 mg total) by mouth every 8 (eight) hours as needed for muscle spasms. 30 tablet 2  finasteride (PROSCAR) 5 MG tablet Take 5 mg by mouth daily. 1/2 tablet daily     ibuprofen (ADVIL) 800 MG tablet Take 1 tablet (800 mg total) by mouth every 8 (eight) hours as needed. 30 tablet 5   methocarbamol (ROBAXIN) 500 MG tablet Take 1 tablet (500 mg total) by mouth 3 (three) times daily. 90 tablet 0   pantoprazole (PROTONIX) 40 MG tablet Take 1 tablet (40 mg total) by mouth daily. 30 tablet 11   Prenat-Fe Poly-Methfol-FA-DHA (VITAFOL FE+) 90-0.6-0.4-200 MG CAPS Take 1 tablet by mouth daily. 30 capsule 11   sucralfate (CARAFATE) 1 g tablet Take 1 tablet (1 g total) by mouth 4 (four) times daily -  with meals and at bedtime. 120 tablet 1   No current  facility-administered medications on file prior to visit.    ALLERGIES: Allergies  Allergen Reactions   Tussionex Pennkinetic Er [Hydrocod Polst-Cpm Polst Er] Other (See Comments)    hallucinations   Morphine Rash   Morphine And Related Anxiety    anxiety   Penicillin G Rash   Penicillins Rash    FAMILY HISTORY: Family History  Adopted: Yes  Problem Relation Age of Onset   Heart attack Father    Cancer Cousin 15       melanoma; mat first cousin, located on neck   Colon cancer Mother 56   Rectal cancer Mother    Cancer Maternal Aunt 60       stomach and ovarian as separate primaries   Cancer Maternal Uncle 62       lung; smoker   Cancer Maternal Uncle 63       prostate   Cancer Maternal Grandmother 35       enodometrial   Cancer Maternal Aunt 55       breast   Stomach cancer Maternal Aunt       Objective:  Blood pressure 109/75, pulse 66, height 5' 3"  (1.6 m), weight 134 lb 3.2 oz (60.9 kg), last menstrual period 03/11/2011, SpO2 96 %. General: No acute distress.  Patient appears well-groomed.   Head:  Normocephalic/atraumatic Eyes:  Fundi examined but not visualized Neck: supple, some paraspinal tenderness, full range of motion Heart:  Regular rate and rhythm Lungs:  Clear to auscultation bilaterally Back: No paraspinal tenderness Neurological Exam: alert and oriented to person, place, and time.  Speech fluent and not dysarthric, language intact.  CN II-XII intact. Bulk and tone normal, muscle strength 5/5 throughout.  Sensation to light touch intact.  Deep tendon reflexes 2+ throughout, toes downgoing.  Finger to nose testing intact.  Gait normal, Romberg negative.   Metta Clines, DO

## 2021-01-31 NOTE — Progress Notes (Signed)
Patient requesting medication to help with anxiety related to injections. Prescription for oral pre-procedure Valium placed today.

## 2021-02-01 ENCOUNTER — Encounter: Payer: Self-pay | Admitting: Neurology

## 2021-02-01 ENCOUNTER — Other Ambulatory Visit: Payer: Self-pay

## 2021-02-01 ENCOUNTER — Ambulatory Visit (INDEPENDENT_AMBULATORY_CARE_PROVIDER_SITE_OTHER): Payer: 59 | Admitting: Neurology

## 2021-02-01 ENCOUNTER — Encounter (HOSPITAL_BASED_OUTPATIENT_CLINIC_OR_DEPARTMENT_OTHER): Payer: Self-pay | Admitting: Physical Therapy

## 2021-02-01 ENCOUNTER — Encounter (HOSPITAL_BASED_OUTPATIENT_CLINIC_OR_DEPARTMENT_OTHER): Payer: 59 | Attending: Physical Therapy | Admitting: Physical Therapy

## 2021-02-01 VITALS — BP 109/75 | HR 66 | Ht 63.0 in | Wt 134.2 lb

## 2021-02-01 DIAGNOSIS — G8929 Other chronic pain: Secondary | ICD-10-CM | POA: Diagnosis present

## 2021-02-01 DIAGNOSIS — M542 Cervicalgia: Secondary | ICD-10-CM

## 2021-02-01 DIAGNOSIS — G43009 Migraine without aura, not intractable, without status migrainosus: Secondary | ICD-10-CM | POA: Diagnosis not present

## 2021-02-01 DIAGNOSIS — M545 Low back pain, unspecified: Secondary | ICD-10-CM | POA: Diagnosis present

## 2021-02-01 DIAGNOSIS — M5412 Radiculopathy, cervical region: Secondary | ICD-10-CM | POA: Diagnosis not present

## 2021-02-01 DIAGNOSIS — G4719 Other hypersomnia: Secondary | ICD-10-CM

## 2021-02-01 MED ORDER — UBRELVY 100 MG PO TABS: 1.0000 | ORAL_TABLET | ORAL | 5 refills | Status: DC | PRN

## 2021-02-01 NOTE — Therapy (Signed)
OUTPATIENT PHYSICAL THERAPY TREATMENT NOTE   Patient Name: [name] MRN: [id number] DOB:Jun 07, 1970, 50 y.o., female Today's Date: 02/01/2021  PCP: Patient, No Pcp Per (Inactive) REFERRING PROVIDER: Vanetta Mulders, MD   PT End of Session - 02/01/21 1602     Visit Number 2    Number of Visits 17    Authorization Type [employer]    PT Start Time 1500    PT Stop Time 1542    PT Time Calculation (min) 42 min    Activity Tolerance Patient tolerated treatment well    Behavior During Therapy [facility] for tasks assessed/performed             Past Medical History:  Diagnosis Date   Abdominal pain, epigastric 11/20/2019   Asthma    as a child   Cervical dysplasia    Gastroesophageal reflux disease 11/20/2019   Genetic testing 02/01/2018   Genetic testing was performed in 2015. The [employer] panel was ordered. In total, 23 genes were analyzed as part of this panel: ATM, BARD1, BRCA1, BRCA2, BRIP1, CDH1, CHEK2, EPCAM, MLH1, MRE11A, MSH2, MSH6, MUTYH, NBN, NF1, PALB2, PMS2, PTEN, RAD50, RAD51C, RAD51D, STK11, and TP53. No pathogenic variants were detected. Genetic testing did detect a Variant of Unknown Significance (VUS) at the t   GERD (gastroesophageal reflux disease)    had during chemo   Headache 06/12/2013   History of chemotherapy    History of kidney stones    History of radiation therapy 11/24/13- 01/08/14   reconstructed right breast/chest wall 4500 cGy 25 sessions, electron beam boost 1440 cGy 8 sessions   Liver masses 12/06/2016   Major depressive disorder, in remission 03/12/2017   Malignant neoplasm of lower-inner quadrant of right breast of female, estrogen receptor positive 2012   Pneumonia    PONV (postoperative nausea and vomiting)    Recurrent cancer of right breast 12/22/2013   Thrombocytopenia 05/22/2013   Past Surgical History:  Procedure Laterality Date   ADENOIDECTOMY     BREAST LUMPECTOMY WITH NEEDLE LOCALIZATION Right 05/05/2013   Procedure: EXCISION  RECURRENT CANCER RIGHT BREAST WITH NEEDLE LOCALOZATION;  Surgeon: Adin Hector, MD;  Location: [place];  Service: General;  Laterality: Right;   BREAST RECONSTRUCTION  06/29/2011   Procedure: BREAST RECONSTRUCTION;  Surgeon: Theodoro Kos, DO;  Location: [place];  Service: Plastics;  Laterality: Bilateral;  Immediate Bilateral Breast Reconstruction with Bilateral Tissue Expanders and placement of  Alloderm    DILATION AND CURETTAGE OF UTERUS     fibroid tumor surgery     lazy eye     corrective surgery   MASTECTOMY W/ SENTINEL NODE BIOPSY  06/29/2011   Procedure: MASTECTOMY WITH SENTINEL LYMPH NODE BIOPSY;  Surgeon: Adin Hector, MD;  Location: [place];  Service: General;  Laterality: Right;  right skin spairing mstectomy and right sentinel lymph node biopsy   PORT-A-CATH REMOVAL  03/12/2012   Procedure: MINOR REMOVAL PORT-A-CATH;  Surgeon: Adin Hector, MD;  Location: [place];  Service: General;  Laterality: N/A;  Upper left   PORTACATH PLACEMENT Left 05/05/2013   Procedure: INSERTION PORT-A-CATH;  Surgeon: Adin Hector, MD;  Location: [place];  Service: General;  Laterality: Left;   RHINOPLASTY     uterine cervical treatments  2002   for precancerous lesions   Patient Active Problem List   Diagnosis Date Noted   Gastroesophageal reflux disease 11/20/2019   Abdominal pain, epigastric 11/20/2019   Major depressive disorder, in remission 03/12/2017  Liver masses 12/06/2016   Uterine cramping 01/08/2014   Recurrent cancer of right breast 12/22/2013   Family history of malignant neoplasm of ovary 07/25/2013   Family history of malignant neoplasm of breast 07/25/2013   Family history of malignant neoplasm of gastrointestinal tract 07/25/2013   Headache 06/12/2013   Nausea alone 06/12/2013   Thrombocytopenia 05/22/2013   Status post bilateral breast reconstruction 01/05/2012   Acquired absence of bilateral breasts and nipples 11/23/2011   Malignant neoplasm of  lower-inner quadrant of right breast of female, estrogen receptor positive 2012    REFERRING DIAG: M54.50,G89.29 (ICD-10-CM) - Chronic low back pain, unspecified back pain laterality, unspecified whether sciatica present M54.2 (ICD-10-CM) - Cervical pain (neck)   THERAPY DIAG:  Cervicalgia  Chronic left-sided low back pain, unspecified whether sciatica present  PERTINENT HISTORY: h/o breast CA with reconstruction, fat removal from bil thighs, migraines  PRECAUTIONS: none   SUBJECTIVE: I am having issues at C4. HA right now from pressure in neck  PAIN:  Are you having pain? Yes VAS scale: 4/10 Pain location: cervical Pain orientation: Bilateral  PAIN TYPE: aching and tension Pain description: intermittent  Aggravating factors: sitting unsupported, leaning fwd Relieving factors: lay down    OBJECTIVE:  DIAGNOSTIC FINDINGS:  Cervical & lumbar MRI 10/13   PATIENT SURVEYS:  ODI: 17 NDI: 21     COGNITION:          Overall cognitive status: Within functional limits for tasks assessed                        SENSATION:          Light touch: occasional N/T in hands, pain into legs when back hurts             POSTURE:  Slouched posture in seated- able to obtain upright position but trunk is anteriorly placed over pelvis, bil scapular winging, bil shoulder elevation and forward rounded.    LUMBARAROM/PROM   A/PROM A/PROM  01/21/2021  Flexion    Extension    Right lateral flexion    Left lateral flexion    Right rotation    Left rotation     (Blank rows = not tested)   LE AROM/PROM:   A/PROM Right 01/21/2021 Left 01/21/2021  Hip flexion      Hip extension      Hip abduction      Hip adduction      Hip internal rotation      Hip external rotation      Knee flexion      Knee extension      Ankle dorsiflexion      Ankle plantarflexion      Ankle inversion      Ankle eversion       (Blank rows = not tested)   LE MMT:   MMT Right 01/21/2021  Left 01/21/2021  Hip flexion      Hip extension      Hip abduction      Hip adduction      Hip internal rotation      Hip external rotation      Knee flexion      Knee extension      Ankle dorsiflexion      Ankle plantarflexion      Ankle inversion      Ankle eversion       (Blank rows = not tested)     GAIT: Pattern WFL, limited  endurance due to pain    TODAY'S TREATMENT: 10/25: AquaticREHABdocumentation: Pt is unable to tolerate land due to pain or inability to move freely on land without pain and substitution/compensations , Water will provide increased arousal using the property of surface tension as this patient struggles with lethargy which impairs the cognitive processing., Pt requires the buoyancy of water for active assisted exercises with buoyancy supported for strengthening & ROM exercises, and Hydrostatic pressure also supports joints by unweighting joint load by at least 50 % in 3-4 feet depth water. 80% in chest to neck deep water.  Entered pool via stairs, depth up to 4', temp 94 deg  Suspended mobilization from cervical region with STM to bil upper traps, cervical paraspinals and suboccipitals.  Standing trunk rotation Wall push ups Triceps press up from seated with scap retraction  Eval: Correct postural alignment Scap retraction  Upper trap & levator stretch Lumbar flexion stretch in seated  PATIENT EDUCATION:  Education details: Anatomy of condition, POC, HEP, exercise form/rationale, aquatics   Person educated: Patient Education method: Consulting civil engineer, Demonstration, Corporate treasurer cues, Verbal cues, and Handouts Education comprehension: verbalized understanding, returned demonstration, verbal cues required, tactile cues required, and needs further education     HOME EXERCISE PROGRAM: [id number]   ASSESSMENT:   CLINICAL IMPRESSION: Excellent tolerance to aquatic therapy today. Notable limitation in Lt to Rt cervical mobility. Could feel a decr in the tension  in her neck following manual therapy today. Felt like her legs were heavy upon getting out of the pool which is normal. She is concerned about lack of tension on Lt thigh where adipose was removed for breast reconstruction. Will strengthen posture through activation in abductor and extensor group and advised that we will monitor changes to see if the area is affected.   We will work in the pool for the first 4 weeks of treatment to decrease pain with exercise and gain tolerance to progress to land-based exercises. Pt c/o symptoms consistent with postural hypotension and will monitor with exercise.    REHAB POTENTIAL: Good   CLINICAL DECISION MAKING: Evolving/moderate complexity   EVALUATION COMPLEXITY: Moderate     GOALS: Goals reviewed with patient? Yes   SHORT TERM GOALS:   STG Name Target Date Goal status  1 Pt will be able to demo proper resting postural alignment Baseline: today worked on scap retraction & moving weight over pelvis 02/11/2021 INITIAL  2 Pt will be independent in daily stretching program  Baseline: will establish as appropriate 02/11/2021 INITIAL                                                 LONG TERM GOALS:    LTG Name Target Date Goal status  1 ODI to improve my MDC of 12 points Baseline: scored 17 points at eval 03/18/2021 INITIAL  2 NDI to improve by MDC of 10 points Baseline: scored 21 points at eval 03/18/2021 INITIAL  3 Pt will be able to bend from a standing position to reach objects on floor with minimal to no discomfort Baseline:very limited at eval 03/18/2021 INITIAL  4 Pt will be able to stand to wash dishes  Baseline: will work on recognition of necessary postural changes and breaks to improve endurance 03/18/2021 INITIAL  5 Pt will be independent in long term exercise to continue advancing strength Baseline:will progress as appropriate 03/18/2021  INITIAL                      PLAN: PT FREQUENCY: 2x/week   PT DURATION: 8 weeks   PLANNED  INTERVENTIONS: Therapeutic exercises, Therapeutic activity, Neuro Muscular re-education, Balance training, Gait training, Patient/Family education, Joint mobilization, Stair training, Aquatic Therapy, Dry Needling, Spinal mobilization, Cryotherapy, Moist heat, and Manual therapy   PLAN FOR NEXT SESSION: begin pool- anterior chest flexibility, periscap activation; core engagement to decrease lumbar spasm, hip hinge/bending tolerance   Dinari Stgermaine C. Arletta Lumadue PT, DPT 02/01/21 4:07 PM

## 2021-02-01 NOTE — Patient Instructions (Signed)
Take [name] as directed Will refer you to Dr. Hulan Saas of [place] for neck pain and cervical radiculopathy Will refer you to sleep medicine for evaluation of sleep apnea Follow up 6 months.

## 2021-02-03 ENCOUNTER — Encounter (HOSPITAL_BASED_OUTPATIENT_CLINIC_OR_DEPARTMENT_OTHER): Payer: Self-pay | Admitting: Orthopaedic Surgery

## 2021-02-03 NOTE — Progress Notes (Signed)
Benito Mccreedy D.[place] Phone: [phone]   Assessment and Plan:     1. Cervicogenic headache 2. Somatic dysfunction of cervical region 3. Somatic dysfunction of thoracic region 4. Somatic dysfunction of lumbar region 5. Somatic dysfunction of pelvic region 6. Somatic dysfunction of rib region -Chronic with exacerbation, initial sports medicine visit - Patient with multiple chronic musculoskeletal complaints that have been more persistent and worsening over the past several months - Patient elected to trial OMT to see if she gets benefit.  Tolerated well per note below  7.  Spinal stenosis in cervical spine - Educated patient that she should continue to see neurology for ongoing treatment  Decision today to treat with OMT was based on Physical Exam   After verbal consent patient was treated with HVLA (high velocity low amplitude), ME (muscle energy), FPR (flex positional release), ST (soft tissue), PC/PD (Pelvic Compression/ Pelvic Decompression) techniques in cervical, rib, thoracic, lumbar, and pelvic areas. Patient tolerated the procedure well with improvement in symptoms.  Patient educated on potential side effects of soreness and recommended to rest, hydrate, and use Tylenol as needed for pain control.    Pertinent previous records reviewed include neurology note from 02/01/2021, MRI lumbar spine 01/20/2021, MRI cervical spine 01/20/2021   Follow Up: In 1 to 2 weeks for reevaluation.  The patient received relief then she may continue with regular OMT treatments    Subjective:   I, Vilma Meckel, am serving as a Education administrator for Dr. Glennon Mac.  Chief Complaint: neck pain with associated headaches  HPI:   02/04/21 Patient is a 50 year old female presenting with Neck pain and was referred to sports medicine for evaluation and treatment. Patient sees neurology for the headaches and neck pain, was last  seen on 02/01/21 by Dr. Tomi Likens for possible OMT. Today patient states LBP started first. 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 Headaches mild to severe and constant and located in the back. Dull constant achy pressure in neck. PT just started this week. Pain down left arm numbness and tingling. LBP spreads over back. Dull achy and pressure. Numbness and tingling doing down both legs. Lipedema in both legs  Relevant Historical Information: History of breast cancer  Additional pertinent review of systems negative.   Current Outpatient Medications:    acetaminophen-codeine (TYLENOL #3) 300-30 MG tablet, Take 1 tablet by mouth every 8 (eight) hours as needed for moderate pain., Disp: 10 tablet, Rfl: 0   aspirin-acetaminophen-caffeine (EXCEDRIN MIGRAINE) 250-250-65 MG tablet, Take by mouth every 6 (six) hours as needed for headache., Disp: , Rfl:    Biotin 5000 MCG CAPS, Take 5,000 mcg by mouth daily., Disp: , Rfl:    Black Pepper-Turmeric (TURMERIC CURCUMIN) 08-998 MG CAPS, Take by mouth 2 (two) times daily., Disp: , Rfl:    celecoxib (CELEBREX) 200 MG capsule, Take 1 capsule (200 mg total) by mouth 2 (two) times daily., Disp: 30 capsule, Rfl: 3   Cholecalciferol (VITAMIN D3) 125 MCG (5000 UT) TBDP, Take 1 tablet by mouth daily., Disp: , Rfl:    Coenzyme Q10 (COQ10) 150 MG CAPS, Take 1 capsule by mouth once., Disp: , Rfl:    cyclobenzaprine (FLEXERIL) 10 MG tablet, Take 1 tablet (10 mg total) by mouth every 8 (eight) hours as needed for muscle spasms., Disp: 30 tablet, Rfl: 2   finasteride (PROSCAR) 5 MG tablet, Take 5 mg by  mouth daily. 1/2 tablet daily, Disp: , Rfl:    ibuprofen (ADVIL) 800 MG tablet, Take 1 tablet (800 mg total) by mouth every 8 (eight) hours as needed., Disp: 30 tablet, Rfl: 5   methocarbamol (ROBAXIN) 500 MG tablet, Take 1 tablet (500 mg total) by mouth 3 (three) times daily., Disp: 90 tablet, Rfl: 0   pantoprazole (PROTONIX) 40 MG tablet,  Take 1 tablet (40 mg total) by mouth daily. (Patient not taking: Reported on 02/01/2021), Disp: 30 tablet, Rfl: 11   Prenat-Fe Poly-Methfol-FA-DHA (VITAFOL FE+) 90-0.6-0.4-200 MG CAPS, Take 1 tablet by mouth daily., Disp: 30 capsule, Rfl: 11   sucralfate (CARAFATE) 1 g tablet, Take 1 tablet (1 g total) by mouth 4 (four) times daily -  with meals and at bedtime. (Patient not taking: Reported on 02/01/2021), Disp: 120 tablet, Rfl: 1   Ubrogepant (UBRELVY) 100 MG TABS, Take 1 tablet by mouth as needed (May repeat in 2 hours.  maximum 2 tablets in 24 hours.)., Disp: 16 tablet, Rfl: 5   Objective:     Vitals:   02/04/21 1011  BP: 118/68  Pulse: 74  SpO2: 99%  Height: 5\' 3"  (1.6 m)      Body mass index is 23.77 kg/m.    Physical Exam:     General: Well-appearing, cooperative, sitting comfortably in no acute distress.   OMT Physical Exam:  ASIS Compression Test: Positive Right Cervical: TTP paraspinal, palpable nodules along right-sided paraspinal muscles.  C3 RRSL, C5-7 RLSL Rib: Bilateral elevated first rib with TTP Thoracic: TTP paraspinal, T4-10 RRSL Lumbar: TTP paraspinal, L3 RRSR Pelvis: Right anterior innominate   Negative Spurling's bilaterally  Electronically signed by:  Benito Mccreedy D.[facility] 12:04 PM 02/04/21

## 2021-02-04 ENCOUNTER — Ambulatory Visit (INDEPENDENT_AMBULATORY_CARE_PROVIDER_SITE_OTHER): Payer: 59 | Admitting: Sports Medicine

## 2021-02-04 ENCOUNTER — Other Ambulatory Visit: Payer: Self-pay

## 2021-02-04 VITALS — BP 118/68 | HR 74 | Ht 63.0 in

## 2021-02-04 DIAGNOSIS — M9903 Segmental and somatic dysfunction of lumbar region: Secondary | ICD-10-CM

## 2021-02-04 DIAGNOSIS — M9901 Segmental and somatic dysfunction of cervical region: Secondary | ICD-10-CM | POA: Diagnosis not present

## 2021-02-04 DIAGNOSIS — M4802 Spinal stenosis, cervical region: Secondary | ICD-10-CM

## 2021-02-04 DIAGNOSIS — G4486 Cervicogenic headache: Secondary | ICD-10-CM

## 2021-02-04 DIAGNOSIS — M9908 Segmental and somatic dysfunction of rib cage: Secondary | ICD-10-CM

## 2021-02-04 DIAGNOSIS — M9902 Segmental and somatic dysfunction of thoracic region: Secondary | ICD-10-CM

## 2021-02-04 DIAGNOSIS — M9905 Segmental and somatic dysfunction of pelvic region: Secondary | ICD-10-CM

## 2021-02-04 NOTE — Patient Instructions (Signed)
Follow up in 1-2 weeks for repeat OMT

## 2021-02-07 ENCOUNTER — Telehealth: Payer: Self-pay | Admitting: Physical Medicine and Rehabilitation

## 2021-02-07 NOTE — Telephone Encounter (Signed)
Pt called to reschedule appt. Please call pt at [phone].

## 2021-02-08 ENCOUNTER — Ambulatory Visit (HOSPITAL_BASED_OUTPATIENT_CLINIC_OR_DEPARTMENT_OTHER): Payer: 59 | Attending: Orthopaedic Surgery | Admitting: Physical Therapy

## 2021-02-08 ENCOUNTER — Encounter (HOSPITAL_BASED_OUTPATIENT_CLINIC_OR_DEPARTMENT_OTHER): Payer: Self-pay | Admitting: Physical Therapy

## 2021-02-08 ENCOUNTER — Other Ambulatory Visit: Payer: Self-pay

## 2021-02-08 ENCOUNTER — Other Ambulatory Visit: Payer: Self-pay | Admitting: Obstetrics

## 2021-02-08 DIAGNOSIS — R2681 Unsteadiness on feet: Secondary | ICD-10-CM

## 2021-02-08 DIAGNOSIS — G8929 Other chronic pain: Secondary | ICD-10-CM

## 2021-02-08 DIAGNOSIS — B379 Candidiasis, unspecified: Secondary | ICD-10-CM

## 2021-02-08 DIAGNOSIS — M545 Low back pain, unspecified: Secondary | ICD-10-CM | POA: Insufficient documentation

## 2021-02-08 DIAGNOSIS — M6281 Muscle weakness (generalized): Secondary | ICD-10-CM | POA: Diagnosis present

## 2021-02-08 DIAGNOSIS — M25561 Pain in right knee: Secondary | ICD-10-CM | POA: Insufficient documentation

## 2021-02-08 DIAGNOSIS — R2689 Other abnormalities of gait and mobility: Secondary | ICD-10-CM

## 2021-02-08 DIAGNOSIS — R262 Difficulty in walking, not elsewhere classified: Secondary | ICD-10-CM | POA: Diagnosis present

## 2021-02-08 DIAGNOSIS — M542 Cervicalgia: Secondary | ICD-10-CM | POA: Diagnosis present

## 2021-02-08 MED ORDER — FLUCONAZOLE 150 MG PO TABS: ORAL_TABLET | ORAL | 2 refills | Status: DC

## 2021-02-08 MED ORDER — FLUCONAZOLE 150 MG PO TABS: 150.0000 mg | ORAL_TABLET | ORAL | 0 refills | Status: DC

## 2021-02-08 NOTE — Therapy (Signed)
OUTPATIENT PHYSICAL THERAPY TREATMENT NOTE   Patient Name: [name] MRN: [id number] DOB:[birth year], 51 y.o., female Today's Date: 02/08/2021  PCP: Patient, No Pcp Per (Inactive) REFERRING PROVIDER: Vanetta Mulders, MD   PT End of Session - 02/08/21 1409     Visit Number 3    Number of Visits [place]    PT Start Time 7893    PT Stop Time 1500    PT Time Calculation (min) 55 min    Activity Tolerance Patient tolerated treatment well    Behavior During Therapy [facility] for tasks assessed/performed             Past Medical History:  Diagnosis Date   Abdominal pain, epigastric 11/20/2019   Asthma    as a child   Cervical dysplasia    Gastroesophageal reflux disease 11/20/2019   Genetic testing 02/01/2018   Genetic testing was performed in 2015. The [employer] panel was ordered. In total, 23 genes were analyzed as part of this panel: ATM, BARD1, BRCA1, BRCA2, BRIP1, CDH1, CHEK2, EPCAM, MLH1, MRE11A, MSH2, MSH6, MUTYH, NBN, NF1, PALB2, PMS2, PTEN, RAD50, RAD51C, RAD51D, STK11, and TP53. No pathogenic variants were detected. Genetic testing did detect a Variant of Unknown Significance (VUS) at the t   GERD (gastroesophageal reflux disease)    had during chemo   Headache 06/12/2013   History of chemotherapy    History of kidney stones    History of radiation therapy 11/24/13- 01/08/14   reconstructed right breast/chest wall 4500 cGy 25 sessions, electron beam boost 1440 cGy 8 sessions   Liver masses 12/06/2016   Major depressive disorder, in remission 03/12/2017   Malignant neoplasm of lower-inner quadrant of right breast of female, estrogen receptor positive 2012   Pneumonia    PONV (postoperative nausea and vomiting)    Recurrent cancer of right breast 12/22/2013   Thrombocytopenia 05/22/2013   Past Surgical History:  Procedure Laterality Date   ADENOIDECTOMY     BREAST LUMPECTOMY WITH NEEDLE LOCALIZATION Right 05/05/2013   Procedure: EXCISION  RECURRENT CANCER RIGHT BREAST WITH NEEDLE LOCALOZATION;  Surgeon: Adin Hector, MD;  Location: [place];  Service: General;  Laterality: Right;   BREAST RECONSTRUCTION  06/29/2011   Procedure: BREAST RECONSTRUCTION;  Surgeon: Theodoro Kos, DO;  Location: [place];  Service: Plastics;  Laterality: Bilateral;  Immediate Bilateral Breast Reconstruction with Bilateral Tissue Expanders and placement of  Alloderm    DILATION AND CURETTAGE OF UTERUS     fibroid tumor surgery     lazy eye     corrective surgery   MASTECTOMY W/ SENTINEL NODE BIOPSY  06/29/2011   Procedure: MASTECTOMY WITH SENTINEL LYMPH NODE BIOPSY;  Surgeon: Adin Hector, MD;  Location: [place];  Service: General;  Laterality: Right;  right skin spairing mstectomy and right sentinel lymph node biopsy   PORT-A-CATH REMOVAL  03/12/2012   Procedure: MINOR REMOVAL PORT-A-CATH;  Surgeon: Adin Hector, MD;  Location: [place];  Service: General;  Laterality: N/A;  Upper left   PORTACATH PLACEMENT Left 05/05/2013   Procedure: INSERTION PORT-A-CATH;  Surgeon: Adin Hector, MD;  Location: [place];  Service: General;  Laterality: Left;   RHINOPLASTY     uterine cervical treatments  2002   for precancerous lesions   Patient Active Problem List   Diagnosis Date Noted   Gastroesophageal reflux disease 11/20/2019   Abdominal pain, epigastric 11/20/2019   Major depressive disorder, in remission 03/12/2017  Liver masses 12/06/2016   Uterine cramping 01/08/2014   Recurrent cancer of right breast 12/22/2013   Family history of malignant neoplasm of ovary 07/25/2013   Family history of malignant neoplasm of breast 07/25/2013   Family history of malignant neoplasm of gastrointestinal tract 07/25/2013   Headache 06/12/2013   Nausea alone 06/12/2013   Thrombocytopenia 05/22/2013   Status post bilateral breast reconstruction 01/05/2012   Acquired absence of bilateral breasts and nipples 11/23/2011   Malignant neoplasm of  lower-inner quadrant of right breast of female, estrogen receptor positive 2012    REFERRING DIAG: M54.50,G89.29 (ICD-10-CM) - Chronic low back pain, unspecified back pain laterality, unspecified whether sciatica present M54.2 (ICD-10-CM) - Cervical pain (neck)   THERAPY DIAG:  Chronic pain of right knee  Difficulty in walking, not elsewhere classified  Muscle weakness (generalized)  Unsteadiness on feet  Other abnormalities of gait and mobility  PERTINENT HISTORY: h/o breast CA with reconstruction, fat removal from bil thighs, migraines  PRECAUTIONS: none   SUBJECTIVE: Shoulders and neck hurting form retrieving eggs from chickens this morning  PAIN:  Are you having pain? Yes VAS scale: 4/10 Pain location: cervical Pain orientation: Bilateral  PAIN TYPE: aching and tension Pain description: intermittent  Aggravating factors: sitting unsupported, leaning fwd Relieving factors: lay down    OBJECTIVE:  DIAGNOSTIC FINDINGS:  Cervical & lumbar MRI 10/13   PATIENT SURVEYS:  ODI: 17 NDI: 21     COGNITION:          Overall cognitive status: Within functional limits for tasks assessed                        SENSATION:          Light touch: occasional N/T in hands, pain into legs when back hurts             POSTURE:  Slouched posture in seated- able to obtain upright position but trunk is anteriorly placed over pelvis, bil scapular winging, bil shoulder elevation and forward rounded.    LUMBARAROM/PROM   A/PROM A/PROM  01/21/2021  Flexion    Extension    Right lateral flexion    Left lateral flexion    Right rotation    Left rotation     (Blank rows = not tested)   LE AROM/PROM:   A/PROM Right 01/21/2021 Left 01/21/2021  Hip flexion      Hip extension      Hip abduction      Hip adduction      Hip internal rotation      Hip external rotation      Knee flexion      Knee extension      Ankle dorsiflexion      Ankle plantarflexion      Ankle  inversion      Ankle eversion       (Blank rows = not tested)   LE MMT:   MMT Right 01/21/2021 Left 01/21/2021  Hip flexion      Hip extension      Hip abduction      Hip adduction      Hip internal rotation      Hip external rotation      Knee flexion      Knee extension      Ankle dorsiflexion      Ankle plantarflexion      Ankle inversion      Ankle eversion       (  Blank rows = not tested)     GAIT: Pattern WFL, limited endurance due to pain    TODAY'S TREATMENT: 11/1 AquaticREHABdocumentation: Pt is unable to tolerate land due to pain or inability to move freely on land without pain and substitution/compensations , Water will provide increased arousal using the property of surface tension as this patient struggles with lethargy which impairs the cognitive processing., Pt requires the buoyancy of water for active assisted exercises with buoyancy supported for strengthening & ROM exercises, and Hydrostatic pressure also supports joints by unweighting joint load by at least 50 % in 3-4 feet depth water. 80% in chest to neck deep water.  Entered pool via stairs, depth up to 4', temp 94 deg Supine suspended mobilization of cervical region with STM to bil upper traps, cervical paraspinals and suboccipitals. Cervical distraction pulling through water.  Aqua stretch techniques to decrease muscle tension and tightness upper traps [place], Pt with lumbar belt around hips and nek doodle for neck support, ankle buoys and noodles .  Pt assisted into supine floating position by lying head on shoulder of PT to get into floating position. . PT at torso and assisting with trunk left to right and vice versa to engage trunk muscles. PT then rotated trunk in order to engage abdomnal (internal and external obliques). Side bending stretching -PPT x 10 verbal and TC for proper execution -Hip extension x 5 bilat -knee flexion x 5 bilat in above position -hip/knee flex with hold for LB stretch 3  x 30 second  10/25: AquaticREHABdocumentation: Pt is unable to tolerate land due to pain or inability to move freely on land without pain and substitution/compensations , Water will provide increased arousal using the property of surface tension as this patient struggles with lethargy which impairs the cognitive processing., Pt requires the buoyancy of water for active assisted exercises with buoyancy supported for strengthening & ROM exercises, and Hydrostatic pressure also supports joints by unweighting joint load by at least 50 % in 3-4 feet depth water. 80% in chest to neck deep water.   Entered pool via stairs, depth up to 4', temp 94 deg             Suspended mobilization from cervical region with STM to bil upper traps, cervical paraspinals and suboccipitals.  Standing trunk rotation Wall push ups Triceps press up from seated with scap retraction   Eval: Correct postural alignment Scap retraction  Upper trap & levator stretch Lumbar flexion stretch in seated  PATIENT EDUCATION:  Education details: Anatomy of condition, POC, HEP, exercise form/rationale, aquatics   Person educated: Patient Education method: Consulting civil engineer, Demonstration, Corporate treasurer cues, Verbal cues, and Handouts Education comprehension: verbalized understanding, returned demonstration, verbal cues required, tactile cues required, and needs further education     HOME EXERCISE PROGRAM: [id number]   ASSESSMENT:   CLINICAL IMPRESSION: Good tolerance to session today. Reports decreased cervical and shoulder discomfort upon completion. Pt instructed on indep suboccipital stretching with chin retraction and lying supine with small pillow just above inferior nuchal line. Cervical ROM upon completion appears full in all directions.   We will work in the pool for the first 4 weeks of treatment to decrease pain with exercise and gain tolerance to progress to land-based exercises. Pt c/o symptoms consistent with postural  hypotension and will monitor with exercise.    REHAB POTENTIAL: Good   CLINICAL DECISION MAKING: Evolving/moderate complexity   EVALUATION COMPLEXITY: Moderate     GOALS: Goals reviewed with patient? Yes  SHORT TERM GOALS:   STG Name Target Date Goal status  1 Pt will be able to demo proper resting postural alignment Baseline: today worked on scap retraction & moving weight over pelvis 02/11/2021 INITIAL  2 Pt will be independent in daily stretching program  Baseline: will establish as appropriate 02/11/2021 INITIAL                                                 LONG TERM GOALS:    LTG Name Target Date Goal status  1 ODI to improve my MDC of 12 points Baseline: scored 17 points at eval 03/18/2021 INITIAL  2 NDI to improve by [place] of 10 points Baseline: scored 21 points at eval 03/18/2021 INITIAL  3 Pt will be able to bend from a standing position to reach objects on floor with minimal to no discomfort Baseline:very limited at eval 03/18/2021 INITIAL  4 Pt will be able to stand to wash dishes  Baseline: will work on recognition of necessary postural changes and breaks to improve endurance 03/18/2021 INITIAL  5 Pt will be independent in long term exercise to continue advancing strength Baseline:will progress as appropriate 03/18/2021 INITIAL                      PLAN: PT FREQUENCY: 2x/week   PT DURATION: 8 weeks   PLANNED INTERVENTIONS: Therapeutic exercises, Therapeutic activity, Neuro Muscular re-education, Balance training, Gait training, Patient/Family education, Joint mobilization, Stair training, Aquatic Therapy, Dry Needling, Spinal mobilization, Cryotherapy, Moist heat, and Manual therapy   PLAN FOR NEXT SESSION: begin pool- anterior chest flexibility, periscap activation; core engagement to decrease lumbar spasm, hip hinge/bending tolerance   Stanton Kidney (Frankie) Ziemba MPT 02/08/21 3:12 PM

## 2021-02-08 NOTE — Progress Notes (Signed)
Diflucan sent per Dr. Jodi Mourning for yeast infection.

## 2021-02-09 ENCOUNTER — Telehealth: Payer: Self-pay

## 2021-02-09 NOTE — Telephone Encounter (Signed)
F/u   Outcome Approved today  The request has been approved. The authorization is effective for a maximum of 6 fills from 02/09/2021 to 08/08/2021, as long as the member is enrolled in their current health plan. The request was approved with a quantity restriction. This has been approved for a quantity limit of 16 with a day supply limit of 30. A written notification letter will follow with additional details.  Drug Roselyn Meier 100MG  tablets

## 2021-02-09 NOTE — Telephone Encounter (Signed)
F/u   Outcome Approved today  The request has been approved. The authorization is effective for a maximum of 6 fills from 02/09/2021 to 08/08/2021, as long as the member is enrolled in their current health plan. The request was approved with a quantity restriction. This has been approved for a quantity limit of 16 with a day supply limit of 30. A written notification letter will follow with additional details.  Drug Ubrelvy 100MG  tablets

## 2021-02-09 NOTE — Telephone Encounter (Signed)
New message    Your information has been sent to [place].  Susy Frizzle Key: [id number] - PA Case ID: [id number]? Call us at [phone] Status Sent to [place] 100MG  tablets Form MedImpact ePA Form 2017 NCPDP

## 2021-02-09 NOTE — Progress Notes (Signed)
Benito Mccreedy D.[place] Phone: [phone]   Assessment and Plan:     1. Cervicogenic headache 2. Somatic dysfunction of cervical region 3. Somatic dysfunction of thoracic region 4. Somatic dysfunction of lumbar region 5. Somatic dysfunction of pelvic region 6. Somatic dysfunction of rib region -Chronic with exacerbation, subsequent visit - Continued multiple musculoskeletal complaints now extending over patient's entire back going into neck - Patient elected for repeat OMT today.  Tolerated well per note below - Patient can discontinue regular Excedrin use and start Tylenol 500 mg 2 tablets 3 times daily for chronic pain control -Continue physical therapy   Decision today to treat with OMT was based on Physical Exam   After verbal consent patient was treated with HVLA (high velocity low amplitude), ME (muscle energy), FPR (flex positional release), ST (soft tissue), PC/PD (Pelvic Compression/ Pelvic Decompression) techniques in cervical, rib, thoracic, lumbar, and pelvic areas. Patient tolerated the procedure well with improvement in symptoms.  Patient educated on potential side effects of soreness and recommended to rest, hydrate, and use Tylenol as needed for pain control.   Pertinent previous records reviewed include none   Follow Up: 2 weeks for repeat OMT   Subjective:   I, Vilma Meckel, am serving as a scribe for Dr. Glennon Mac  Chief Complaint: Neck pain   HPI:   02/04/21 Patient is a 50 year old female presenting with Neck pain and was referred to sports medicine for evaluation and treatment. Patient sees neurology for the headaches and neck pain, was last seen on 02/01/21 by Dr. Tomi Likens for possible OMT. Today patient states LBP started first. 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 Headaches mild to severe and constant and located in the back. Dull constant achy  pressure in neck. PT just started this week. Pain down left arm numbness and tingling. LBP spreads over back. Dull achy and pressure. Numbness and tingling doing down both legs. Lipedema in both legs  02/10/21 Patient states whole back has been hurting over the past 2 days. Did start PT and feels like everything is moving in a positive direction. No new complaints.  Relevant Historical Information: History of breast cancer, ovarian cancer, spinal stenosis  Additional pertinent review of systems negative.  Current Outpatient Medications  Medication Sig Dispense Refill   acetaminophen-codeine (TYLENOL #3) 300-30 MG tablet Take 1 tablet by mouth every 8 (eight) hours as needed for moderate pain. 10 tablet 0   aspirin-acetaminophen-caffeine (EXCEDRIN MIGRAINE) 277-412-87 MG tablet Take by mouth every 6 (six) hours as needed for headache.     Biotin 5000 MCG CAPS Take 5,000 mcg by mouth daily.     Black Pepper-Turmeric (TURMERIC CURCUMIN) 08-998 MG CAPS Take by mouth 2 (two) times daily.     celecoxib (CELEBREX) 200 MG capsule Take 1 capsule (200 mg total) by mouth 2 (two) times daily. 30 capsule 3   Cholecalciferol (VITAMIN D3) 125 MCG (5000 UT) TBDP Take 1 tablet by mouth daily.     Coenzyme Q10 (COQ10) 150 MG CAPS Take 1 capsule by mouth once.     cyclobenzaprine (FLEXERIL) 10 MG tablet Take 1 tablet (10 mg total) by mouth every 8 (eight) hours as needed for muscle spasms. 30 tablet 2   finasteride (PROSCAR) 5 MG tablet Take 5 mg by mouth daily. 1/2 tablet daily     fluconazole (DIFLUCAN) 150 MG tablet TAKE BY MOUTH AS  A ONE TIME DOSE.  MAY REPEAT IN 72 HRS PRN. 2 tablet 2   fluconazole (DIFLUCAN) 150 MG tablet Take 1 tablet (150 mg total) by mouth every 3 (three) days. For three doses 3 tablet 0   ibuprofen (ADVIL) 800 MG tablet Take 1 tablet (800 mg total) by mouth every 8 (eight) hours as needed. 30 tablet 5   methocarbamol (ROBAXIN) 500 MG tablet Take 1 tablet (500 mg total) by mouth 3  (three) times daily. 90 tablet 0   pantoprazole (PROTONIX) 40 MG tablet Take 1 tablet (40 mg total) by mouth daily. (Patient not taking: Reported on 02/01/2021) 30 tablet 11   Prenat-Fe Poly-Methfol-FA-DHA (VITAFOL FE+) 90-0.6-0.4-200 MG CAPS Take 1 tablet by mouth daily. 30 capsule 11   sucralfate (CARAFATE) 1 g tablet Take 1 tablet (1 g total) by mouth 4 (four) times daily -  with meals and at bedtime. (Patient not taking: Reported on 02/01/2021) 120 tablet 1   Ubrogepant (UBRELVY) 100 MG TABS Take 1 tablet by mouth as needed (May repeat in 2 hours.  maximum 2 tablets in 24 hours.). 16 tablet 5   No current facility-administered medications for this visit.      Objective:     Vitals:   02/10/21 1316  BP: (!) 102/98  Pulse: 69  SpO2: 95%  Height: 5\' 3"  (1.6 m)      Body mass index is 23.77 kg/m.    Physical Exam:     General: Well-appearing, cooperative, sitting comfortably in no acute distress.   OMT Physical Exam:  ASIS Compression Test: Positive Right Cervical: TTP paraspinal, C3 RR SL Rib: Ribs 4-8 stuck in inhalation on right Thoracic: TTP paraspinal, T4-8 RRSL Lumbar: TTP paraspinal, L1-3 RRSL Pelvis: Right anterior innominate  Electronically signed by:  Benito Mccreedy D.[facility] 3:41 PM 02/10/21

## 2021-02-09 NOTE — Telephone Encounter (Signed)
Susy Frizzle Key: [id number] - PA Case ID: [id number] help? Call us at [phone] Status Sent to [place] 100MG  tablets Form MedImpact ePA Form 2017 NCPDP

## 2021-02-10 ENCOUNTER — Other Ambulatory Visit: Payer: Self-pay

## 2021-02-10 ENCOUNTER — Ambulatory Visit (INDEPENDENT_AMBULATORY_CARE_PROVIDER_SITE_OTHER): Payer: 59 | Admitting: Sports Medicine

## 2021-02-10 ENCOUNTER — Ambulatory Visit: Payer: 59 | Admitting: Physical Medicine and Rehabilitation

## 2021-02-10 VITALS — BP 102/98 | HR 69 | Ht 63.0 in

## 2021-02-10 DIAGNOSIS — M9903 Segmental and somatic dysfunction of lumbar region: Secondary | ICD-10-CM | POA: Diagnosis not present

## 2021-02-10 DIAGNOSIS — M9901 Segmental and somatic dysfunction of cervical region: Secondary | ICD-10-CM

## 2021-02-10 DIAGNOSIS — M9902 Segmental and somatic dysfunction of thoracic region: Secondary | ICD-10-CM | POA: Diagnosis not present

## 2021-02-10 DIAGNOSIS — G4486 Cervicogenic headache: Secondary | ICD-10-CM

## 2021-02-10 DIAGNOSIS — M9908 Segmental and somatic dysfunction of rib cage: Secondary | ICD-10-CM

## 2021-02-10 DIAGNOSIS — M9905 Segmental and somatic dysfunction of pelvic region: Secondary | ICD-10-CM

## 2021-02-10 NOTE — Patient Instructions (Addendum)
Good to see you  Start using tylenol 500mg  2 tablets 3 times daily  2 week OMT follow up

## 2021-02-11 ENCOUNTER — Ambulatory Visit (HOSPITAL_BASED_OUTPATIENT_CLINIC_OR_DEPARTMENT_OTHER): Payer: 59 | Admitting: Physical Therapy

## 2021-02-11 ENCOUNTER — Encounter (HOSPITAL_BASED_OUTPATIENT_CLINIC_OR_DEPARTMENT_OTHER): Payer: Self-pay | Admitting: Physical Therapy

## 2021-02-11 DIAGNOSIS — R262 Difficulty in walking, not elsewhere classified: Secondary | ICD-10-CM

## 2021-02-11 DIAGNOSIS — M25561 Pain in right knee: Secondary | ICD-10-CM

## 2021-02-11 DIAGNOSIS — G8929 Other chronic pain: Secondary | ICD-10-CM

## 2021-02-11 DIAGNOSIS — M6281 Muscle weakness (generalized): Secondary | ICD-10-CM

## 2021-02-11 NOTE — Therapy (Signed)
OUTPATIENT PHYSICAL THERAPY TREATMENT NOTE   Patient Name: [name] MRN: [id number] DOB:March 06, 1971, 50 y.o., female Today's Date: 02/11/2021  PCP: Patient, No Pcp Per (Inactive) REFERRING PROVIDER: Vanetta Mulders, MD   PT End of Session - 02/11/21 1945     Visit Number 4    Number of Visits 17    Date for PT Re-Evaluation 03/18/21    Authorization Type [employer]    PT Start Time 1518    PT Stop Time 1600    PT Time Calculation (min) 42 min    Activity Tolerance Patient tolerated treatment well    Behavior During Therapy [facility] for tasks assessed/performed              Past Medical History:  Diagnosis Date   Abdominal pain, epigastric 11/20/2019   Asthma    as a child   Cervical dysplasia    Gastroesophageal reflux disease 11/20/2019   Genetic testing 02/01/2018   Genetic testing was performed in 2015. The [employer] panel was ordered. In total, 23 genes were analyzed as part of this panel: ATM, BARD1, BRCA1, BRCA2, BRIP1, CDH1, CHEK2, EPCAM, MLH1, MRE11A, MSH2, MSH6, MUTYH, NBN, NF1, PALB2, PMS2, PTEN, RAD50, RAD51C, RAD51D, STK11, and TP53. No pathogenic variants were detected. Genetic testing did detect a Variant of Unknown Significance (VUS) at the t   GERD (gastroesophageal reflux disease)    had during chemo   Headache 06/12/2013   History of chemotherapy    History of kidney stones    History of radiation therapy 11/24/13- 01/08/14   reconstructed right breast/chest wall 4500 cGy 25 sessions, electron beam boost 1440 cGy 8 sessions   Liver masses 12/06/2016   Major depressive disorder, in remission 03/12/2017   Malignant neoplasm of lower-inner quadrant of right breast of female, estrogen receptor positive 2012   Pneumonia    PONV (postoperative nausea and vomiting)    Recurrent cancer of right breast 12/22/2013   Thrombocytopenia 05/22/2013   Past Surgical History:  Procedure Laterality Date   ADENOIDECTOMY     BREAST LUMPECTOMY WITH NEEDLE  LOCALIZATION Right 05/05/2013   Procedure: EXCISION RECURRENT CANCER RIGHT BREAST WITH NEEDLE LOCALOZATION;  Surgeon: Adin Hector, MD;  Location: [place];  Service: General;  Laterality: Right;   BREAST RECONSTRUCTION  06/29/2011   Procedure: BREAST RECONSTRUCTION;  Surgeon: Theodoro Kos, DO;  Location: [place];  Service: Plastics;  Laterality: Bilateral;  Immediate Bilateral Breast Reconstruction with Bilateral Tissue Expanders and placement of  Alloderm    DILATION AND CURETTAGE OF UTERUS     fibroid tumor surgery     lazy eye     corrective surgery   MASTECTOMY W/ SENTINEL NODE BIOPSY  06/29/2011   Procedure: MASTECTOMY WITH SENTINEL LYMPH NODE BIOPSY;  Surgeon: Adin Hector, MD;  Location: [place];  Service: General;  Laterality: Right;  right skin spairing mstectomy and right sentinel lymph node biopsy   PORT-A-CATH REMOVAL  03/12/2012   Procedure: MINOR REMOVAL PORT-A-CATH;  Surgeon: Adin Hector, MD;  Location: [place];  Service: General;  Laterality: N/A;  Upper left   PORTACATH PLACEMENT Left 05/05/2013   Procedure: INSERTION PORT-A-CATH;  Surgeon: Adin Hector, MD;  Location: [place];  Service: General;  Laterality: Left;   RHINOPLASTY     uterine cervical treatments  2002   for precancerous lesions   Patient Active Problem List   Diagnosis Date Noted   Gastroesophageal reflux disease 11/20/2019   Abdominal pain, epigastric 11/20/2019  Major depressive disorder, in remission 03/12/2017   Liver masses 12/06/2016   Uterine cramping 01/08/2014   Recurrent cancer of right breast 12/22/2013   Family history of malignant neoplasm of ovary 07/25/2013   Family history of malignant neoplasm of breast 07/25/2013   Family history of malignant neoplasm of gastrointestinal tract 07/25/2013   Headache 06/12/2013   Nausea alone 06/12/2013   Thrombocytopenia 05/22/2013   Status post bilateral breast reconstruction 01/05/2012   Acquired absence of bilateral breasts  and nipples 11/23/2011   Malignant neoplasm of lower-inner quadrant of right breast of female, estrogen receptor positive 2012    REFERRING DIAG: M54.50,G89.29 (ICD-10-CM) - Chronic low back pain, unspecified back pain laterality, unspecified whether sciatica present M54.2 (ICD-10-CM) - Cervical pain (neck)   THERAPY DIAG:  Chronic pain of right knee  Difficulty in walking, not elsewhere classified  Muscle weakness (generalized)  PERTINENT HISTORY: h/o breast CA with reconstruction, fat removal from bil thighs, migraines  PRECAUTIONS: none   SUBJECTIVE: Shoulders and neck hurting form retrieving eggs from chickens this morning  PAIN:  Are you having pain? Yes VAS scale: 4/10 Pain location: cervical Pain orientation: Bilateral  PAIN TYPE: aching and tension Pain description: intermittent  Aggravating factors: sitting unsupported, leaning fwd Relieving factors: lay down    OBJECTIVE:  DIAGNOSTIC FINDINGS:  Cervical & lumbar MRI 10/13   PATIENT SURVEYS:  ODI: 17 NDI: 21     COGNITION:          Overall cognitive status: Within functional limits for tasks assessed                        SENSATION:          Light touch: occasional N/T in hands, pain into legs when back hurts             POSTURE:  Slouched posture in seated- able to obtain upright position but trunk is anteriorly placed over pelvis, bil scapular winging, bil shoulder elevation and forward rounded.    LUMBARAROM/PROM   A/PROM A/PROM  01/21/2021  Flexion    Extension    Right lateral flexion    Left lateral flexion    Right rotation    Left rotation     (Blank rows = not tested)   LE AROM/PROM:   A/PROM Right 01/21/2021 Left 01/21/2021  Hip flexion      Hip extension      Hip abduction      Hip adduction      Hip internal rotation      Hip external rotation      Knee flexion      Knee extension      Ankle dorsiflexion      Ankle plantarflexion      Ankle inversion      Ankle  eversion       (Blank rows = not tested)   LE MMT:   MMT Right 01/21/2021 Left 01/21/2021  Hip flexion      Hip extension      Hip abduction      Hip adduction      Hip internal rotation      Hip external rotation      Knee flexion      Knee extension      Ankle dorsiflexion      Ankle plantarflexion      Ankle inversion      Ankle eversion       (Blank rows =  not tested)     GAIT: Pattern WFL, limited endurance due to pain    TODAY'S TREATMENT: 11/4  AquaticREHABdocumentation: Water will provide increased arousal using the property of surface tension as this patient struggles with lethargy which impairs the cognitive processing., Water will aid with movement using the current and laminar flow while the buoyancy reduces weight bearing, and Hydrostatic pressure also supports joints by unweighting joint load by at least 50 % in 3-4 feet depth water. 80% in chest to neck deep water. Pt entered pool via stairs, depth up to 4', temp 94 deg.  Walking with arms to side for chest stretch  Backward walking- arms to side for resisted scap retraction  Triceps press down & straight arm press down with dumbbells  Both hands on one dumbbell- hip hinge with press down  Triceps press ups from seated on bench   Added V sit, curl ins   Altered to press down on water bar for decreased stability  Manual tehrapy: STM to bil upper traps  11/1 AquaticREHABdocumentation: Pt is unable to tolerate land due to pain or inability to move freely on land without pain and substitution/compensations , Water will provide increased arousal using the property of surface tension as this patient struggles with lethargy which impairs the cognitive processing., Pt requires the buoyancy of water for active assisted exercises with buoyancy supported for strengthening & ROM exercises, and Hydrostatic pressure also supports joints by unweighting joint load by at least 50 % in 3-4 feet depth water. 80% in chest to neck  deep water.  Entered pool via stairs, depth up to 4', temp 94 deg Supine suspended mobilization of cervical region with STM to bil upper traps, cervical paraspinals and suboccipitals. Cervical distraction pulling through water.  Aqua stretch techniques to decrease muscle tension and tightness upper traps [place]az, Pt with lumbar belt around hips and nek doodle for neck support, ankle buoys and noodles .  Pt assisted into supine floating position by lying head on shoulder of PT to get into floating position. . PT at torso and assisting with trunk left to right and vice versa to engage trunk muscles. PT then rotated trunk in order to engage abdomnal (internal and external obliques). Side bending stretching -PPT x 10 verbal and TC for proper execution -Hip extension x 5 bilat -knee flexion x 5 bilat in above position -hip/knee flex with hold for LB stretch 3 x 30 second  10/25: AquaticREHABdocumentation: Pt is unable to tolerate land due to pain or inability to move freely on land without pain and substitution/compensations , Water will provide increased arousal using the property of surface tension as this patient struggles with lethargy which impairs the cognitive processing., Pt requires the buoyancy of water for active assisted exercises with buoyancy supported for strengthening & ROM exercises, and Hydrostatic pressure also supports joints by unweighting joint load by at least 50 % in 3-4 feet depth water. 80% in chest to neck deep water.   Entered pool via stairs, depth up to 4', temp 94 deg             Suspended mobilization from cervical region with STM to bil upper traps, cervical paraspinals and suboccipitals.  Standing trunk rotation Wall push ups Triceps press up from seated with scap retraction   Eval: Correct postural alignment Scap retraction  Upper trap & levator stretch Lumbar flexion stretch in seated  PATIENT EDUCATION:  Education details: Anatomy of condition, POC, HEP,  exercise form/rationale, aquatics  Person educated: Patient Education method: Explanation, Demonstration, Tactile cues, Verbal cues, and Handouts Education comprehension: verbalized understanding, returned demonstration, verbal cues required, tactile cues required, and needs further education     HOME EXERCISE PROGRAM: [id number]   ASSESSMENT:   CLINICAL IMPRESSION: Trigger point in Rt upper trap released with manual therapy today. Cuing required to decrease overuse of upper traps- exercises added activation of triceps to encourage upright posture.   We will work in the pool for the first 4 weeks of treatment to decrease pain with exercise and gain tolerance to progress to land-based exercises. Pt c/o symptoms consistent with postural hypotension and will monitor with exercise.    REHAB POTENTIAL: Good   CLINICAL DECISION MAKING: Evolving/moderate complexity   EVALUATION COMPLEXITY: Moderate     GOALS: Goals reviewed with patient? Yes   SHORT TERM GOALS:   STG Name Target Date Goal status  1 Pt will be able to demo proper resting postural alignment Baseline: still has to correct when she remembers but is able to correct 02/11/2021 achieved  2 Pt will be independent in daily stretching program  Baseline: achieved at this time 02/11/2021 achieved                                                 LONG TERM GOALS:    LTG Name Target Date Goal status  1 ODI to improve my MDC of 12 points Baseline: scored 17 points at eval 03/18/2021 INITIAL  2 NDI to improve by [place] of 10 points Baseline: scored 21 points at eval 03/18/2021 INITIAL  3 Pt will be able to bend from a standing position to reach objects on floor with minimal to no discomfort Baseline:very limited at eval 03/18/2021 INITIAL  4 Pt will be able to stand to wash dishes  Baseline: will work on recognition of necessary postural changes and breaks to improve endurance 03/18/2021 INITIAL  5 Pt will be independent in long term  exercise to continue advancing strength Baseline:will progress as appropriate 03/18/2021 INITIAL                      PLAN: PT FREQUENCY: 2x/week   PT DURATION: 8 weeks   PLANNED INTERVENTIONS: Therapeutic exercises, Therapeutic activity, Neuro Muscular re-education, Balance training, Gait training, Patient/Family education, Joint mobilization, Stair training, Aquatic Therapy, Dry Needling, Spinal mobilization, Cryotherapy, Moist heat, and Manual therapy   PLAN FOR NEXT SESSION: anterior chest flexibility, periscap activation; core engagement to decrease lumbar spasm, hip hinge/bending tolerance   Meighan Treto C. Marigrace Mccole PT, DPT 02/11/21 7:52 PM

## 2021-02-14 NOTE — Therapy (Incomplete)
OUTPATIENT PHYSICAL THERAPY TREATMENT NOTE   Patient Name: [name] MRN: [id number] DOB:05-Oct-1970, 50 y.o., female Today's Date: 02/14/2021  PCP: Patient, No Pcp Per (Inactive) REFERRING PROVIDER: Vanetta Mulders, MD      Past Medical History:  Diagnosis Date   Abdominal pain, epigastric 11/20/2019   Asthma    as a child   Cervical dysplasia    Gastroesophageal reflux disease 11/20/2019   Genetic testing 02/01/2018   Genetic testing was performed in 2015. The [employer] panel was ordered. In total, 23 genes were analyzed as part of this panel: ATM, BARD1, BRCA1, BRCA2, BRIP1, CDH1, CHEK2, EPCAM, MLH1, MRE11A, MSH2, MSH6, MUTYH, NBN, NF1, PALB2, PMS2, PTEN, RAD50, RAD51C, RAD51D, STK11, and TP53. No pathogenic variants were detected. Genetic testing did detect a Variant of Unknown Significance (VUS) at the t   GERD (gastroesophageal reflux disease)    had during chemo   Headache 06/12/2013   History of chemotherapy    History of kidney stones    History of radiation therapy 11/24/13- 01/08/14   reconstructed right breast/chest wall 4500 cGy 25 sessions, electron beam boost 1440 cGy 8 sessions   Liver masses 12/06/2016   Major depressive disorder, in remission 03/12/2017   Malignant neoplasm of lower-inner quadrant of right breast of female, estrogen receptor positive 2012   Pneumonia    PONV (postoperative nausea and vomiting)    Recurrent cancer of right breast 12/22/2013   Thrombocytopenia 05/22/2013   Past Surgical History:  Procedure Laterality Date   ADENOIDECTOMY     BREAST LUMPECTOMY WITH NEEDLE LOCALIZATION Right 05/05/2013   Procedure: EXCISION RECURRENT CANCER RIGHT BREAST WITH NEEDLE LOCALOZATION;  Surgeon: Adin Hector, MD;  Location: [place];  Service: General;  Laterality: Right;   BREAST RECONSTRUCTION  06/29/2011   Procedure: BREAST RECONSTRUCTION;  Surgeon: Theodoro Kos, DO;  Location: [place];  Service: Plastics;  Laterality: Bilateral;  Immediate  Bilateral Breast Reconstruction with Bilateral Tissue Expanders and placement of  Alloderm    DILATION AND CURETTAGE OF UTERUS     fibroid tumor surgery     lazy eye     corrective surgery   MASTECTOMY W/ SENTINEL NODE BIOPSY  06/29/2011   Procedure: MASTECTOMY WITH SENTINEL LYMPH NODE BIOPSY;  Surgeon: Adin Hector, MD;  Location: [place];  Service: General;  Laterality: Right;  right skin spairing mstectomy and right sentinel lymph node biopsy   PORT-A-CATH REMOVAL  03/12/2012   Procedure: MINOR REMOVAL PORT-A-CATH;  Surgeon: Adin Hector, MD;  Location: [place];  Service: General;  Laterality: N/A;  Upper left   PORTACATH PLACEMENT Left 05/05/2013   Procedure: INSERTION PORT-A-CATH;  Surgeon: Adin Hector, MD;  Location: [place];  Service: General;  Laterality: Left;   RHINOPLASTY     uterine cervical treatments  2002   for precancerous lesions   Patient Active Problem List   Diagnosis Date Noted   Gastroesophageal reflux disease 11/20/2019   Abdominal pain, epigastric 11/20/2019   Major depressive disorder, in remission 03/12/2017   Liver masses 12/06/2016   Uterine cramping 01/08/2014   Recurrent cancer of right breast 12/22/2013   Family history of malignant neoplasm of ovary 07/25/2013   Family history of malignant neoplasm of breast 07/25/2013   Family history of malignant neoplasm of gastrointestinal tract 07/25/2013   Headache 06/12/2013   Nausea alone 06/12/2013   Thrombocytopenia 05/22/2013   Status post bilateral breast reconstruction 01/05/2012   Acquired absence of bilateral breasts and nipples 11/23/2011  Malignant neoplasm of lower-inner quadrant of right breast of female, estrogen receptor positive 2012    REFERRING DIAG: M54.50,G89.29 (ICD-10-CM) - Chronic low back pain, unspecified back pain laterality, unspecified whether sciatica present M54.2 (ICD-10-CM) - Cervical pain (neck)   THERAPY DIAG:  No diagnosis found.  PERTINENT  HISTORY: h/o breast CA with reconstruction, fat removal from bil thighs, migraines  PRECAUTIONS: none   SUBJECTIVE: ***  PAIN:  Are you having pain? Yes VAS scale: ***/10 Pain location: cervical Pain orientation: Bilateral  PAIN TYPE: aching and tension Pain description: intermittent  Aggravating factors: sitting unsupported, leaning fwd Relieving factors: lay down    OBJECTIVE:  DIAGNOSTIC FINDINGS:  Cervical & lumbar MRI 10/13   PATIENT SURVEYS:  ODI: 17 NDI: 21     COGNITION:          Overall cognitive status: Within functional limits for tasks assessed                        SENSATION:          Light touch: occasional N/T in hands, pain into legs when back hurts             POSTURE:  Slouched posture in seated- able to obtain upright position but trunk is anteriorly placed over pelvis, bil scapular winging, bil shoulder elevation and forward rounded.    LUMBARAROM/PROM   A/PROM A/PROM  01/21/2021  Flexion    Extension    Right lateral flexion    Left lateral flexion    Right rotation    Left rotation     (Blank rows = not tested)   LE AROM/PROM:   A/PROM Right 01/21/2021 Left 01/21/2021  Hip flexion      Hip extension      Hip abduction      Hip adduction      Hip internal rotation      Hip external rotation      Knee flexion      Knee extension      Ankle dorsiflexion      Ankle plantarflexion      Ankle inversion      Ankle eversion       (Blank rows = not tested)   LE MMT:   MMT Right 01/21/2021 Left 01/21/2021  Hip flexion      Hip extension      Hip abduction      Hip adduction      Hip internal rotation      Hip external rotation      Knee flexion      Knee extension      Ankle dorsiflexion      Ankle plantarflexion      Ankle inversion      Ankle eversion       (Blank rows = not tested)     GAIT: Pattern WFL, limited endurance due to pain    TODAY'S TREATMENT: 11/8  AquaticREHABdocumentation: Water will provide  increased arousal using the property of surface tension as this patient struggles with lethargy which impairs the cognitive processing., Water will aid with movement using the current and laminar flow while the buoyancy reduces weight bearing, and Hydrostatic pressure also supports joints by unweighting joint load by at least 50 % in 3-4 feet depth water. 80% in chest to neck deep water.  11/4  AquaticREHABdocumentation: Water will provide increased arousal using the property of surface tension as this patient struggles with lethargy which  impairs the cognitive processing., Water will aid with movement using the current and laminar flow while the buoyancy reduces weight bearing, and Hydrostatic pressure also supports joints by unweighting joint load by at least 50 % in 3-4 feet depth water. 80% in chest to neck deep water. Pt entered pool via stairs, depth up to 4', temp 94 deg.  Walking with arms to side for chest stretch  Backward walking- arms to side for resisted scap retraction  Triceps press down & straight arm press down with dumbbells  Both hands on one dumbbell- hip hinge with press down  Triceps press ups from seated on bench   Added V sit, curl ins   Altered to press down on water bar for decreased stability  Manual tehrapy: STM to bil upper traps  11/1 AquaticREHABdocumentation: Pt is unable to tolerate land due to pain or inability to move freely on land without pain and substitution/compensations , Water will provide increased arousal using the property of surface tension as this patient struggles with lethargy which impairs the cognitive processing., Pt requires the buoyancy of water for active assisted exercises with buoyancy supported for strengthening & ROM exercises, and Hydrostatic pressure also supports joints by unweighting joint load by at least 50 % in 3-4 feet depth water. 80% in chest to neck deep water.  Entered pool via stairs, depth up to 4', temp 94 deg Supine  suspended mobilization of cervical region with STM to bil upper traps, cervical paraspinals and suboccipitals. Cervical distraction pulling through water.  Aqua stretch techniques to decrease muscle tension and tightness upper traps [place], Pt with lumbar belt around hips and nek doodle for neck support, ankle buoys and noodles .  Pt assisted into supine floating position by lying head on shoulder of PT to get into floating position. . PT at torso and assisting with trunk left to right and vice versa to engage trunk muscles. PT then rotated trunk in order to engage abdomnal (internal and external obliques). Side bending stretching -PPT x 10 verbal and TC for proper execution -Hip extension x 5 bilat -knee flexion x 5 bilat in above position -hip/knee flex with hold for LB stretch 3 x 30 second  10/25: AquaticREHABdocumentation: Pt is unable to tolerate land due to pain or inability to move freely on land without pain and substitution/compensations , Water will provide increased arousal using the property of surface tension as this patient struggles with lethargy which impairs the cognitive processing., Pt requires the buoyancy of water for active assisted exercises with buoyancy supported for strengthening & ROM exercises, and Hydrostatic pressure also supports joints by unweighting joint load by at least 50 % in 3-4 feet depth water. 80% in chest to neck deep water.   Entered pool via stairs, depth up to 4', temp 94 deg             Suspended mobilization from cervical region with STM to bil upper traps, cervical paraspinals and suboccipitals.  Standing trunk rotation Wall push ups Triceps press up from seated with scap retraction   Eval: Correct postural alignment Scap retraction  Upper trap & levator stretch Lumbar flexion stretch in seated  PATIENT EDUCATION:  Education details: Anatomy of condition, POC, HEP, exercise form/rationale, aquatics   Person educated: Patient Education  method: Consulting civil engineer, Media planner, Corporate treasurer cues, Verbal cues, and Handouts Education comprehension: verbalized understanding, returned demonstration, verbal cues required, tactile cues required, and needs further education     HOME EXERCISE PROGRAM: [id number]   ASSESSMENT:  CLINICAL IMPRESSION: ***  We will work in the pool for the first 4 weeks of treatment to decrease pain with exercise and gain tolerance to progress to land-based exercises. Pt c/o symptoms consistent with postural hypotension and will monitor with exercise.    REHAB POTENTIAL: Good   CLINICAL DECISION MAKING: Evolving/moderate complexity   EVALUATION COMPLEXITY: Moderate     GOALS: Goals reviewed with patient? Yes   SHORT TERM GOALS:   STG Name Target Date Goal status  1 Pt will be able to demo proper resting postural alignment Baseline: still has to correct when she remembers but is able to correct 02/11/2021 achieved  2 Pt will be independent in daily stretching program  Baseline: achieved at this time 02/11/2021 achieved                                                 LONG TERM GOALS:    LTG Name Target Date Goal status  1 ODI to improve my MDC of 12 points Baseline: scored 17 points at eval 03/18/2021 INITIAL  2 NDI to improve by [place] of 10 points Baseline: scored 21 points at eval 03/18/2021 INITIAL  3 Pt will be able to bend from a standing position to reach objects on floor with minimal to no discomfort Baseline:very limited at eval 03/18/2021 INITIAL  4 Pt will be able to stand to wash dishes  Baseline: will work on recognition of necessary postural changes and breaks to improve endurance 03/18/2021 INITIAL  5 Pt will be independent in long term exercise to continue advancing strength Baseline:will progress as appropriate 03/18/2021 INITIAL                      PLAN: PT FREQUENCY: 2x/week   PT DURATION: 8 weeks   PLANNED INTERVENTIONS: Therapeutic exercises, Therapeutic activity, Neuro  Muscular re-education, Balance training, Gait training, Patient/Family education, Joint mobilization, Stair training, Aquatic Therapy, Dry Needling, Spinal mobilization, Cryotherapy, Moist heat, and Manual therapy   PLAN FOR NEXT SESSION: anterior chest flexibility, periscap activation; core engagement to decrease lumbar spasm, hip hinge/bending tolerance   Nicholus Chandran C. Dolan Xia PT, DPT 02/14/21 8:55 PM

## 2021-02-15 ENCOUNTER — Ambulatory Visit (HOSPITAL_BASED_OUTPATIENT_CLINIC_OR_DEPARTMENT_OTHER): Payer: 59 | Admitting: Cardiology

## 2021-02-15 ENCOUNTER — Ambulatory Visit (HOSPITAL_BASED_OUTPATIENT_CLINIC_OR_DEPARTMENT_OTHER): Payer: 59 | Admitting: Physical Therapy

## 2021-02-16 ENCOUNTER — Other Ambulatory Visit: Payer: Self-pay

## 2021-02-16 ENCOUNTER — Ambulatory Visit (INDEPENDENT_AMBULATORY_CARE_PROVIDER_SITE_OTHER): Payer: 59 | Admitting: Cardiology

## 2021-02-16 ENCOUNTER — Encounter (HOSPITAL_BASED_OUTPATIENT_CLINIC_OR_DEPARTMENT_OTHER): Payer: Self-pay | Admitting: Cardiology

## 2021-02-16 VITALS — BP 106/76 | HR 65 | Ht 63.0 in | Wt 130.0 lb

## 2021-02-16 DIAGNOSIS — I700 Atherosclerosis of aorta: Secondary | ICD-10-CM

## 2021-02-16 DIAGNOSIS — Z712 Person consulting for explanation of examination or test findings: Secondary | ICD-10-CM | POA: Diagnosis not present

## 2021-02-16 DIAGNOSIS — Z8249 Family history of ischemic heart disease and other diseases of the circulatory system: Secondary | ICD-10-CM

## 2021-02-16 DIAGNOSIS — R0609 Other forms of dyspnea: Secondary | ICD-10-CM

## 2021-02-16 DIAGNOSIS — R42 Dizziness and giddiness: Secondary | ICD-10-CM | POA: Diagnosis not present

## 2021-02-16 DIAGNOSIS — Z9221 Personal history of antineoplastic chemotherapy: Secondary | ICD-10-CM

## 2021-02-16 DIAGNOSIS — Z7189 Other specified counseling: Secondary | ICD-10-CM

## 2021-02-16 NOTE — Progress Notes (Signed)
Cardiology Office Note:    Date:  02/16/2021   ID:  [name], DOB 19-Jan-1971, MRN [id number]  PCP:  Patient, No Pcp Per (Inactive)  Cardiologist:  Buford Dresser, MD  CC: Follow-up   History of Present Illness:    [name] is a 50 y.o. female with a hx of recurrent breast cancer, now followed in survivorship clinic who is seen for follow-up. I initially met her 12/07/2020 as a new consult for the evaluation and management of abnormal CT, family history of heart disease.  Note from 10/08/20 reviewed. Had aortic atherosclerosis on CT, has family history of heart disease. Requested referral to cardiology for further evaluation.  Cardiovascular risk factors: Prior clinical ASCVD: none Comorbid conditions, including hypertension, hyperlipidemia, diabetes, chronic kidney disease: none Metabolic syndrome/Obesity: none Chronic inflammatory conditions: none Tobacco use history: never Family history: biological father had heart attack age 1, paternal grandfather died age 53 from a heart condition.  Prior cardiac testing and/or incidental findings on other testing (ie coronary calcium): has history of echocardiograms due to chemotherapy/herceptin, last 2015. Did not have done during her second incidence of breast cancer/chemotherapy  Today: Overall she is feeling okay. However, she is suffering from back issues causing chronic back and neck pain. From time to time she also experiences chest pain.  After staying in a squatting or bent position while outside, she will develop lightheadedness and feel pre-syncopal. Over the years, her dizziness seems to have generally worsened, not necessarily just with bending over.  At home she has 13 stairs. Typically she becomes winded before reaching the top.  She denies any palpitations, headaches, orthopnea, or PND.   Past Medical History:  Diagnosis Date   Abdominal pain, epigastric 11/20/2019   Asthma    as a child   Cervical  dysplasia    Gastroesophageal reflux disease 11/20/2019   Genetic testing 02/01/2018   Genetic testing was performed in 2015. The [employer] panel was ordered. In total, 23 genes were analyzed as part of this panel: ATM, BARD1, BRCA1, BRCA2, BRIP1, CDH1, CHEK2, EPCAM, MLH1, MRE11A, MSH2, MSH6, MUTYH, NBN, NF1, PALB2, PMS2, PTEN, RAD50, RAD51C, RAD51D, STK11, and TP53. No pathogenic variants were detected. Genetic testing did detect a Variant of Unknown Significance (VUS) at the t   GERD (gastroesophageal reflux disease)    had during chemo   Headache 06/12/2013   History of chemotherapy    History of kidney stones    History of radiation therapy 11/24/13- 01/08/14   reconstructed right breast/chest wall 4500 cGy 25 sessions, electron beam boost 1440 cGy 8 sessions   Liver masses 12/06/2016   Major depressive disorder, in remission 03/12/2017   Malignant neoplasm of lower-inner quadrant of right breast of female, estrogen receptor positive 2012   Pneumonia    PONV (postoperative nausea and vomiting)    Recurrent cancer of right breast 12/22/2013   Thrombocytopenia 05/22/2013    Past Surgical History:  Procedure Laterality Date   ADENOIDECTOMY     BREAST LUMPECTOMY WITH NEEDLE LOCALIZATION Right 05/05/2013   Procedure: EXCISION RECURRENT CANCER RIGHT BREAST WITH NEEDLE LOCALOZATION;  Surgeon: Adin Hector, MD;  Location: [place];  Service: General;  Laterality: Right;   BREAST RECONSTRUCTION  06/29/2011   Procedure: BREAST RECONSTRUCTION;  Surgeon: Theodoro Kos, DO;  Location: [place];  Service: Plastics;  Laterality: Bilateral;  Immediate Bilateral Breast Reconstruction with Bilateral Tissue Expanders and placement of  Alloderm    DILATION AND CURETTAGE OF UTERUS  fibroid tumor surgery     lazy eye     corrective surgery   MASTECTOMY W/ SENTINEL NODE BIOPSY  06/29/2011   Procedure: MASTECTOMY WITH SENTINEL LYMPH NODE BIOPSY;  Surgeon: Adin Hector, MD;  Location: [place];  Service:  General;  Laterality: Right;  right skin spairing mstectomy and right sentinel lymph node biopsy   PORT-A-CATH REMOVAL  03/12/2012   Procedure: MINOR REMOVAL PORT-A-CATH;  Surgeon: Adin Hector, MD;  Location: [place];  Service: General;  Laterality: N/A;  Upper left   PORTACATH PLACEMENT Left 05/05/2013   Procedure: INSERTION PORT-A-CATH;  Surgeon: Adin Hector, MD;  Location: [place];  Service: General;  Laterality: Left;   RHINOPLASTY     uterine cervical treatments  2002   for precancerous lesions    Current Medications: Current Outpatient Medications on File Prior to Visit  Medication Sig   acetaminophen-codeine (TYLENOL #3) 300-30 MG tablet Take 1 tablet by mouth every 8 (eight) hours as needed for moderate pain.   aspirin-acetaminophen-caffeine (EXCEDRIN MIGRAINE) 250-250-65 MG tablet Take by mouth every 6 (six) hours as needed for headache.   Biotin 5000 MCG CAPS Take 5,000 mcg by mouth daily.   Black Pepper-Turmeric (TURMERIC CURCUMIN) 08-998 MG CAPS Take by mouth 2 (two) times daily.   celecoxib (CELEBREX) 200 MG capsule Take 1 capsule (200 mg total) by mouth 2 (two) times daily.   Cholecalciferol (VITAMIN D3) 125 MCG (5000 UT) TBDP Take 1 tablet by mouth daily.   Coenzyme Q10 (COQ10) 150 MG CAPS Take 1 capsule by mouth once.   cyclobenzaprine (FLEXERIL) 10 MG tablet Take 1 tablet (10 mg total) by mouth every 8 (eight) hours as needed for muscle spasms.   finasteride (PROSCAR) 5 MG tablet Take 5 mg by mouth daily. 1/2 tablet daily   methocarbamol (ROBAXIN) 500 MG tablet Take 1 tablet (500 mg total) by mouth 3 (three) times daily.   Prenat-Fe Poly-Methfol-FA-DHA (VITAFOL FE+) 90-0.6-0.4-200 MG CAPS Take 1 tablet by mouth daily.   Ubrogepant (UBRELVY) 100 MG TABS Take 1 tablet by mouth as needed (May repeat in 2 hours.  maximum 2 tablets in 24 hours.).   fluconazole (DIFLUCAN) 150 MG tablet TAKE BY MOUTH AS A ONE TIME DOSE.  MAY REPEAT IN 72 HRS PRN. (Patient  not taking: Reported on 02/16/2021)   fluconazole (DIFLUCAN) 150 MG tablet Take 1 tablet (150 mg total) by mouth every 3 (three) days. For three doses (Patient not taking: Reported on 02/16/2021)   ibuprofen (ADVIL) 800 MG tablet Take 1 tablet (800 mg total) by mouth every 8 (eight) hours as needed. (Patient not taking: Reported on 02/16/2021)   pantoprazole (PROTONIX) 40 MG tablet Take 1 tablet (40 mg total) by mouth daily. (Patient not taking: Reported on 02/16/2021)   sucralfate (CARAFATE) 1 g tablet Take 1 tablet (1 g total) by mouth 4 (four) times daily -  with meals and at bedtime. (Patient not taking: Reported on 02/16/2021)   No current facility-administered medications on file prior to visit.     Allergies:   Tussionex pennkinetic er [hydrocod polst-cpm polst er], Morphine, Morphine and related, Penicillin g, and Penicillins   Social History   Tobacco Use   Smoking status: Former    Types: Cigarettes    Quit date: 07/20/1996    Years since quitting: 24.5   Smokeless tobacco: Never  Substance Use Topics   Alcohol use: Not Currently    Alcohol/week: 0.0 standard drinks   Drug use:  No    Family History: family history includes Cancer (age of onset: 57) in her cousin; Cancer (age of onset: 17) in her maternal aunt; Cancer (age of onset: 59) in her maternal aunt; Cancer (age of onset: 58) in her maternal uncle; Cancer (age of onset: 2) in her maternal uncle; Cancer (age of onset: 23) in her maternal grandmother; Colon cancer (age of onset: 48) in her mother; Heart attack in her father; Rectal cancer in her mother; Stomach cancer in her maternal aunt. She was adopted.  ROS:   Please see the history of present illness. (+) Back and neck pain (+) Chest pain (+) Exertional shortness of breath (+) Lightheadedness/Dizziness (+) Pre-syncope All other systems are reviewed and negative.    EKGs/Labs/Other Studies Reviewed:    The following studies were reviewed today:  Calcium Score  01/26/2021: FINDINGS: Coronary arteries: Normal origins.   Coronary Calcium Score:   Left main: 0   Left anterior descending artery: 0   Left circumflex artery: 0   Right coronary artery: 0   Total: 0   Percentile: 0   Pericardium: Normal.   Ascending Aorta: Normal caliber.   Non-cardiac: See separate report from [facility].   IMPRESSION: 1. Coronary calcium score of 0.  This is a low risk study.  Echo 01/26/2021:  1. Left ventricular ejection fraction, by estimation, is 60 to 65%. The  left ventricle has normal function. The left ventricle has no regional  wall motion abnormalities. Left ventricular diastolic parameters were  normal. The average left ventricular  global longitudinal strain is -24.4 %. The global longitudinal strain is  normal.   2. Right ventricular systolic function is normal. The right ventricular  size is normal. There is normal pulmonary artery systolic pressure.   3. The mitral valve is normal in structure. Mild mitral valve  regurgitation. No evidence of mitral stenosis.   4. The aortic valve is normal in structure. Aortic valve regurgitation is  not visualized. No aortic stenosis is present.   5. The inferior vena cava is normal in size with greater than 50%  respiratory variability, suggesting right atrial pressure of 3 mmHg.   Echo 08/25/2013 - Left ventricle: Normal GLS -22.5 The cavity size was normal.    Systolic function was normal. Wall motion was normal; there were    no regional wall motion abnormalities.  - Atrial septum: No defect or patent foramen ovale was identified.   EKG:  EKG is personally reviewed.   02/16/2021: not ordered 12/07/2020: Normal sinus rhythm with sinus arrhythmia at 70 bpm. Normal ECG  Recent Labs: 08/24/2020: ALT 14; BUN 16; Creatinine 0.89; Hemoglobin 13.2; Platelet Count 250; Potassium 4.1; Sodium 140; TSH 1.113   Recent Lipid Panel    Component Value Date/Time   CHOL 174 08/03/2015 1442    TRIG 71 07/15/2014 1146   HDL 87 08/03/2015 1442   CHOLHDL 2.3 07/15/2014 1146   VLDL 14 07/15/2014 1146   LDLCALC 91 07/15/2014 1146   LDLDIRECT 95 08/03/2015 1442    Physical Exam:    VS:  BP 106/76 (BP Location: Left Arm, Patient Position: Sitting, Cuff Size: Normal)   Pulse 65   Ht 5' 3"  (1.6 m)   Wt 130 lb (59 kg)   LMP 03/11/2011   SpO2 99%   BMI 23.03 kg/m    No data found.  Wt Readings from Last 3 Encounters:  02/16/21 130 lb (59 kg)  02/01/21 134 lb 3.2 oz (60.9 kg)  12/27/20 130  lb (59 kg)    GEN: Well nourished, well developed in no acute distress HEENT: Normal, moist mucous membranes NECK: No JVD CARDIAC: regular rhythm, normal S1 and S2, no rubs or gallops. No murmur. VASCULAR: Radial and DP pulses 2+ bilaterally. No carotid bruits RESPIRATORY:  Clear to auscultation without rales, wheezing or rhonchi  ABDOMEN: Soft, non-tender, non-distended MUSCULOSKELETAL:  Ambulates independently SKIN: Warm and dry, no edema NEUROLOGIC:  Alert and oriented x 3. No focal neuro deficits noted. PSYCHIATRIC:  Normal affect    ASSESSMENT:    1. Encounter to discuss test results   2. Aortic atherosclerosis ([place])   3. Dizziness on standing   4. Family history of heart disease   5. History of antineoplastic chemotherapy   6. Cardiac risk counseling   7. Dyspnea on exertion     PLAN:    Aortic atherosclerosis, calcium seen on imaging -had coronary calcium score of 0. Discussed that though there is evidence of aortic atherosclerosis, with a calcium score of 0, guidelines would suggest that statin is not required -she has a history of gastritis and frequent excedrin use. Will avoid aspirin at this time -counseled on red flag warning signs that need immediate medical attention  Dyspnea on exertion History of chemotherapy. -echo very reassuring, excellent strain, normal EF, no significant valve disease -not cardiac in origin, encouraged gradual increase in exercise as  able  dizziness with changing position Intermittent LE edema -orthostatics negative previously. Cautioned on slow, gradual position changes -Strong pulses. No evidence of cardiac edema or venous stasis. Recommended compression stockings  Cardiac risk counseling and prevention recommendations: -recommend heart healthy/Mediterranean diet, with whole grains, fruits, vegetable, fish, lean meats, nuts, and olive oil. Limit salt. -recommend moderate walking, 3-5 times/week for 30-50 minutes each session. Aim for at least 150 minutes.week. Goal should be pace of 3 miles/hours, or walking 1.5 miles in 30 minutes -recommend avoidance of tobacco products. Avoid excess alcohol.  Plan for follow up: I would be happy to see her back as needed  Buford Dresser, MD, PhD, [facility]    Medication Adjustments/Labs and Tests Ordered: Current medicines are reviewed at length with the patient today.  Concerns regarding medicines are outlined above.   No orders of the defined types were placed in this encounter.  No orders of the defined types were placed in this encounter.  Patient Instructions  Medication Instructions:  Your Physician recommend you continue on your current medication as directed.    *If you need a refill on your cardiac medications before your next appointment, please call your pharmacy*   Lab Work: None ordered today   Testing/Procedures: None ordered today   Follow-Up: At [facility], you and your health needs are our priority.  As part of our continuing mission to provide you with exceptional heart care, we have created designated Provider Care Teams.  These Care Teams include your primary Cardiologist (physician) and Advanced Practice Providers (APPs -  Physician Assistants and Nurse Practitioners) who all work together to provide you with the care you need, when you need it.  We recommend signing up for the patient portal called "MyChart".   Sign up information is provided on this After Visit Summary.  MyChart is used to connect with patients for Virtual Visits (Telemedicine).  Patients are able to view lab/test results, encounter notes, upcoming appointments, etc.  Non-urgent messages can be sent to your provider as well.   To learn more about what you can do  with MyChart, go to [email or web address].    Your next appointment:   As needed  The format for your next appointment:   In Person  Provider:   Buford Dresser, MD       [facility] Stumpf,acting as a scribe for Buford Dresser, MD.,have documented all relevant documentation on the behalf of Buford Dresser, MD,as directed by  Buford Dresser, MD while in the presence of Buford Dresser, MD.  I, Buford Dresser, MD, have reviewed all documentation for this visit. The documentation on 02/16/21 for the exam, diagnosis, procedures, and orders are all accurate and complete.   Signed, Buford Dresser, MD PhD 02/16/2021 3:30 PM    [place]

## 2021-02-16 NOTE — Patient Instructions (Signed)

## 2021-02-17 ENCOUNTER — Ambulatory Visit (HOSPITAL_BASED_OUTPATIENT_CLINIC_OR_DEPARTMENT_OTHER): Payer: 59 | Admitting: Physical Therapy

## 2021-02-17 ENCOUNTER — Encounter: Payer: Self-pay | Admitting: Physical Medicine and Rehabilitation

## 2021-02-17 ENCOUNTER — Encounter (HOSPITAL_BASED_OUTPATIENT_CLINIC_OR_DEPARTMENT_OTHER): Payer: Self-pay | Admitting: Physical Therapy

## 2021-02-17 DIAGNOSIS — G8929 Other chronic pain: Secondary | ICD-10-CM

## 2021-02-17 DIAGNOSIS — M6281 Muscle weakness (generalized): Secondary | ICD-10-CM

## 2021-02-17 DIAGNOSIS — M25561 Pain in right knee: Secondary | ICD-10-CM | POA: Diagnosis not present

## 2021-02-17 DIAGNOSIS — R262 Difficulty in walking, not elsewhere classified: Secondary | ICD-10-CM

## 2021-02-17 NOTE — Therapy (Signed)
OUTPATIENT PHYSICAL THERAPY TREATMENT NOTE   Patient Name: [name] MRN: [id number] DOB:January 28, 1971, 50 y.o., female Today's Date: 02/17/2021  PCP: Patient, No Pcp Per (Inactive) REFERRING PROVIDER: Vanetta Mulders, MD   PT End of Session - 02/17/21 1238     Visit Number 5    Number of Visits 17    Date for PT Re-Evaluation 03/18/21    Authorization Type [employer]    PT Start Time 3244    PT Stop Time 0102    PT Time Calculation (min) 41 min    Activity Tolerance Patient tolerated treatment well    Behavior During Therapy [facility] for tasks assessed/performed              Past Medical History:  Diagnosis Date   Abdominal pain, epigastric 11/20/2019   Asthma    as a child   Cervical dysplasia    Gastroesophageal reflux disease 11/20/2019   Genetic testing 02/01/2018   Genetic testing was performed in 2015. The [employer] panel was ordered. In total, 23 genes were analyzed as part of this panel: ATM, BARD1, BRCA1, BRCA2, BRIP1, CDH1, CHEK2, EPCAM, MLH1, MRE11A, MSH2, MSH6, MUTYH, NBN, NF1, PALB2, PMS2, PTEN, RAD50, RAD51C, RAD51D, STK11, and TP53. No pathogenic variants were detected. Genetic testing did detect a Variant of Unknown Significance (VUS) at the t   GERD (gastroesophageal reflux disease)    had during chemo   Headache 06/12/2013   History of chemotherapy    History of kidney stones    History of radiation therapy 11/24/13- 01/08/14   reconstructed right breast/chest wall 4500 cGy 25 sessions, electron beam boost 1440 cGy 8 sessions   Liver masses 12/06/2016   Major depressive disorder, in remission 03/12/2017   Malignant neoplasm of lower-inner quadrant of right breast of female, estrogen receptor positive 2012   Pneumonia    PONV (postoperative nausea and vomiting)    Recurrent cancer of right breast 12/22/2013   Thrombocytopenia 05/22/2013   Past Surgical History:  Procedure Laterality Date   ADENOIDECTOMY     BREAST LUMPECTOMY WITH NEEDLE  LOCALIZATION Right 05/05/2013   Procedure: EXCISION RECURRENT CANCER RIGHT BREAST WITH NEEDLE LOCALOZATION;  Surgeon: Adin Hector, MD;  Location: [place];  Service: General;  Laterality: Right;   BREAST RECONSTRUCTION  06/29/2011   Procedure: BREAST RECONSTRUCTION;  Surgeon: Theodoro Kos, DO;  Location: [place];  Service: Plastics;  Laterality: Bilateral;  Immediate Bilateral Breast Reconstruction with Bilateral Tissue Expanders and placement of  Alloderm    DILATION AND CURETTAGE OF UTERUS     fibroid tumor surgery     lazy eye     corrective surgery   MASTECTOMY W/ SENTINEL NODE BIOPSY  06/29/2011   Procedure: MASTECTOMY WITH SENTINEL LYMPH NODE BIOPSY;  Surgeon: Adin Hector, MD;  Location: [place];  Service: General;  Laterality: Right;  right skin spairing mstectomy and right sentinel lymph node biopsy   PORT-A-CATH REMOVAL  03/12/2012   Procedure: MINOR REMOVAL PORT-A-CATH;  Surgeon: Adin Hector, MD;  Location: [place];  Service: General;  Laterality: N/A;  Upper left   PORTACATH PLACEMENT Left 05/05/2013   Procedure: INSERTION PORT-A-CATH;  Surgeon: Adin Hector, MD;  Location: [place];  Service: General;  Laterality: Left;   RHINOPLASTY     uterine cervical treatments  2002   for precancerous lesions   Patient Active Problem List   Diagnosis Date Noted   Gastroesophageal reflux disease 11/20/2019   Abdominal pain, epigastric 11/20/2019  Major depressive disorder, in remission 03/12/2017   Liver masses 12/06/2016   Uterine cramping 01/08/2014   Recurrent cancer of right breast 12/22/2013   Family history of malignant neoplasm of ovary 07/25/2013   Family history of malignant neoplasm of breast 07/25/2013   Family history of malignant neoplasm of gastrointestinal tract 07/25/2013   Headache 06/12/2013   Nausea alone 06/12/2013   Thrombocytopenia 05/22/2013   Status post bilateral breast reconstruction 01/05/2012   Acquired absence of bilateral breasts  and nipples 11/23/2011   Malignant neoplasm of lower-inner quadrant of right breast of female, estrogen receptor positive 2012    REFERRING DIAG: M54.50,G89.29 (ICD-10-CM) - Chronic low back pain, unspecified back pain laterality, unspecified whether sciatica present M54.2 (ICD-10-CM) - Cervical pain (neck)   THERAPY DIAG:  Chronic pain of right knee  Difficulty in walking, not elsewhere classified  Muscle weakness (generalized)  PERTINENT HISTORY: h/o breast CA with reconstruction, fat removal from bil thighs, migraines  PRECAUTIONS: none   SUBJECTIVE: feeling a lot of tension in suboccipital region. I hung off a door frame this morning to try to stretch and have been tight ever since. I notice when I take NSAIDS my back hurts. I was looking back at my MRIs and got discouraged  PAIN:  Are you having pain? Yes VAS scale: 4/10 Pain location: cervical Pain orientation: Bilateral  PAIN TYPE: aching and tension Pain description: intermittent  Aggravating factors: sitting unsupported, leaning fwd Relieving factors: lay down    OBJECTIVE:  DIAGNOSTIC FINDINGS:  Cervical & lumbar MRI 10/13   PATIENT SURVEYS:  ODI: 17 NDI: 21     COGNITION:          Overall cognitive status: Within functional limits for tasks assessed                        SENSATION:          Light touch: occasional N/T in hands, pain into legs when back hurts             POSTURE:  Slouched posture in seated- able to obtain upright position but trunk is anteriorly placed over pelvis, bil scapular winging, bil shoulder elevation and forward rounded.    LUMBARAROM/PROM   A/PROM A/PROM  01/21/2021  Flexion    Extension    Right lateral flexion    Left lateral flexion    Right rotation    Left rotation     (Blank rows = not tested)   LE AROM/PROM:   A/PROM Right 01/21/2021 Left 01/21/2021  Hip flexion      Hip extension      Hip abduction      Hip adduction      Hip internal rotation       Hip external rotation      Knee flexion      Knee extension      Ankle dorsiflexion      Ankle plantarflexion      Ankle inversion      Ankle eversion       (Blank rows = not tested)   LE MMT:   MMT Right 01/21/2021 Left 01/21/2021  Hip flexion      Hip extension      Hip abduction      Hip adduction      Hip internal rotation      Hip external rotation      Knee flexion      Knee extension  Ankle dorsiflexion      Ankle plantarflexion      Ankle inversion      Ankle eversion       (Blank rows = not tested)     GAIT: Pattern WFL, limited endurance due to pain    TODAY'S TREATMENT: 11/10  AquaticREHABdocumentation: Water will provide increased arousal using the property of surface tension as this patient struggles with lethargy which impairs the cognitive processing., Water will aid with movement using the current and laminar flow while the buoyancy reduces weight bearing, and Hydrostatic pressure also supports joints by unweighting joint load by at least 50 % in 3-4 feet depth water. 80% in chest to neck deep water.  Entered pool via stairs without use of hand rail, depth up to 4', temp 95 deg.   Forward walking with abdominal engagement Manual STM to suboccipitals with mobile floating for stretch and joint mobility; also performed with bilateral QLs  Float planks with belt under pelvis  Knee/elbow planks on steps  11/4  AquaticREHABdocumentation: Water will provide increased arousal using the property of surface tension as this patient struggles with lethargy which impairs the cognitive processing., Water will aid with movement using the current and laminar flow while the buoyancy reduces weight bearing, and Hydrostatic pressure also supports joints by unweighting joint load by at least 50 % in 3-4 feet depth water. 80% in chest to neck deep water. Pt entered pool via stairs, depth up to 4', temp 94 deg.  Walking with arms to side for chest stretch  Backward  walking- arms to side for resisted scap retraction  Triceps press down & straight arm press down with dumbbells  Both hands on one dumbbell- hip hinge with press down  Triceps press ups from seated on bench   Added V sit, curl ins   Altered to press down on water bar for decreased stability  Manual tehrapy: STM to bil upper traps  11/1 AquaticREHABdocumentation: Pt is unable to tolerate land due to pain or inability to move freely on land without pain and substitution/compensations , Water will provide increased arousal using the property of surface tension as this patient struggles with lethargy which impairs the cognitive processing., Pt requires the buoyancy of water for active assisted exercises with buoyancy supported for strengthening & ROM exercises, and Hydrostatic pressure also supports joints by unweighting joint load by at least 50 % in 3-4 feet depth water. 80% in chest to neck deep water.  Entered pool via stairs, depth up to 4', temp 94 deg Supine suspended mobilization of cervical region with STM to bil upper traps, cervical paraspinals and suboccipitals. Cervical distraction pulling through water.  Aqua stretch techniques to decrease muscle tension and tightness upper traps [place], Pt with lumbar belt around hips and nek doodle for neck support, ankle buoys and noodles .  Pt assisted into supine floating position by lying head on shoulder of PT to get into floating position. . PT at torso and assisting with trunk left to right and vice versa to engage trunk muscles. PT then rotated trunk in order to engage abdomnal (internal and external obliques). Side bending stretching -PPT x 10 verbal and TC for proper execution -Hip extension x 5 bilat -knee flexion x 5 bilat in above position -hip/knee flex with hold for LB stretch 3 x 30 second  10/25: AquaticREHABdocumentation: Pt is unable to tolerate land due to pain or inability to move freely on land without pain and  substitution/compensations , Water will  provide increased arousal using the property of surface tension as this patient struggles with lethargy which impairs the cognitive processing., Pt requires the buoyancy of water for active assisted exercises with buoyancy supported for strengthening & ROM exercises, and Hydrostatic pressure also supports joints by unweighting joint load by at least 50 % in 3-4 feet depth water. 80% in chest to neck deep water.   Entered pool via stairs, depth up to 4', temp 94 deg             Suspended mobilization from cervical region with STM to bil upper traps, cervical paraspinals and suboccipitals.  Standing trunk rotation Wall push ups Triceps press up from seated with scap retraction   Eval: Correct postural alignment Scap retraction  Upper trap & levator stretch Lumbar flexion stretch in seated  PATIENT EDUCATION:  Education details: discussed MRI reports   Person educated: Patient Education method: Explanation Education comprehension: verbalized understanding     HOME EXERCISE PROGRAM: [id number]   ASSESSMENT:   CLINICAL IMPRESSION: Time taken today to discuss MRI reports to decrease anxiety about them and findings online. Will perform land re-evaluation next visit and add in TPDN with focus on cervical musculature.   We will work in the pool for the first 4 weeks of treatment to decrease pain with exercise and gain tolerance to progress to land-based exercises. Pt c/o symptoms consistent with postural hypotension and will monitor with exercise.    REHAB POTENTIAL: Good   CLINICAL DECISION MAKING: Evolving/moderate complexity   EVALUATION COMPLEXITY: Moderate     GOALS: Goals reviewed with patient? Yes   SHORT TERM GOALS:   STG Name Target Date Goal status  1 Pt will be able to demo proper resting postural alignment Baseline: still has to correct when she remembers but is able to correct 02/11/2021 achieved  2 Pt will be independent in  daily stretching program  Baseline: achieved at this time 02/11/2021 achieved                                                 LONG TERM GOALS:    LTG Name Target Date Goal status  1 ODI to improve my MDC of 12 points Baseline: scored 17 points at eval 03/18/2021 INITIAL  2 NDI to improve by [place] of 10 points Baseline: scored 21 points at eval 03/18/2021 INITIAL  3 Pt will be able to bend from a standing position to reach objects on floor with minimal to no discomfort Baseline:very limited at eval 03/18/2021 INITIAL  4 Pt will be able to stand to wash dishes  Baseline: will work on recognition of necessary postural changes and breaks to improve endurance 03/18/2021 INITIAL  5 Pt will be independent in long term exercise to continue advancing strength Baseline:will progress as appropriate 03/18/2021 INITIAL                      PLAN: PT FREQUENCY: 2x/week   PT DURATION: 8 weeks   PLANNED INTERVENTIONS: Therapeutic exercises, Therapeutic activity, Neuro Muscular re-education, Balance training, Gait training, Patient/Family education, Joint mobilization, Stair training, Aquatic Therapy, Dry Needling, Spinal mobilization, Cryotherapy, Moist heat, and Manual therapy   PLAN FOR NEXT SESSION: anterior chest flexibility, periscap activation; core engagement to decrease lumbar spasm, hip hinge/bending tolerance, TPDN   Caden Fatica C. Dulse Rutan PT, DPT 02/17/21 12:41 PM

## 2021-02-18 NOTE — Progress Notes (Signed)
Benito Mccreedy D.[place] Phone: [phone]   Assessment and Plan:     1. Cervicogenic headache 2. Somatic dysfunction of cervical region 3. Somatic dysfunction of thoracic region 4. Somatic dysfunction of lumbar region 5. Somatic dysfunction of pelvic region 6. Somatic dysfunction of rib region -Chronic with exacerbation, subsequent visit - Recurrence of typical musculoskeletal complaints with most prominent being neck tightness causing cervicogenic headaches, and low back pain after moving heavy objects this weekend - Patient has received significant relief with OMT in the past.  Elects for repeat OMT today.  Tolerated well per note below. - Decision today to treat with OMT was based on Physical Exam   After verbal consent patient was treated with HVLA (high velocity low amplitude), ME (muscle energy), FPR (flex positional release), ST (soft tissue), PC/PD (Pelvic Compression/ Pelvic Decompression) techniques in cervical, rib, thoracic, lumbar, and pelvic areas. Patient tolerated the procedure well with improvement in symptoms.  Patient educated on potential side effects of soreness and recommended to rest, hydrate, and use Tylenol as needed for pain control.   Pertinent previous records reviewed include none   Follow Up: 2 weeks for repeat OMT   Subjective:   I, Judy Pimple, am serving as a scribe for Dr. Glennon Mac  Chief Complaint: Neck and back pain   HPI:     02/04/21 Patient is a 50 year old female presenting with Neck pain and was referred to sports medicine for evaluation and treatment. Patient sees neurology for the headaches and neck pain, was last seen on 02/01/21 by Dr. Tomi Likens for possible OMT. Today patient states LBP started first. 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 Headaches mild to severe and constant and located in the back. Dull constant achy  pressure in neck. PT just started this week. Pain down left arm numbness and tingling. LBP spreads over back. Dull achy and pressure. Numbness and tingling doing down both legs. Lipedema in both legs   02/10/21 Patient states whole back has been hurting over the past 2 days. Did start PT and feels like everything is moving in a positive direction. No new complaints.  02/21/21 Patient states that her neck and head are hurting and took ibuprofen and Excedrin to help. Patient also had PT and had dry needling today.   Relevant Historical Information:  History of breast cancer, ovarian cancer, spinal stenosis  Additional pertinent review of systems negative.  Current Outpatient Medications  Medication Sig Dispense Refill   acetaminophen-codeine (TYLENOL #3) 300-30 MG tablet Take 1 tablet by mouth every 8 (eight) hours as needed for moderate pain. 10 tablet 0   aspirin-acetaminophen-caffeine (EXCEDRIN MIGRAINE) 893-734-28 MG tablet Take by mouth every 6 (six) hours as needed for headache.     Biotin 5000 MCG CAPS Take 5,000 mcg by mouth daily.     Black Pepper-Turmeric (TURMERIC CURCUMIN) 08-998 MG CAPS Take by mouth 2 (two) times daily.     celecoxib (CELEBREX) 200 MG capsule Take 1 capsule (200 mg total) by mouth 2 (two) times daily. 30 capsule 3   Cholecalciferol (VITAMIN D3) 125 MCG (5000 UT) TBDP Take 1 tablet by mouth daily.     Coenzyme Q10 (COQ10) 150 MG CAPS Take 1 capsule by mouth once.     cyclobenzaprine (FLEXERIL) 10 MG tablet Take 1 tablet (10 mg total) by mouth every 8 (eight) hours as needed for muscle spasms. 30 tablet  2   finasteride (PROSCAR) 5 MG tablet Take 5 mg by mouth daily. 1/2 tablet daily     fluconazole (DIFLUCAN) 150 MG tablet TAKE BY MOUTH AS A ONE TIME DOSE.  MAY REPEAT IN 72 HRS PRN. (Patient taking differently: TAKE BY MOUTH AS A ONE TIME DOSE.  MAY REPEAT IN 72 HRS PRN.) 2 tablet 2   fluconazole (DIFLUCAN) 150 MG tablet Take 1 tablet (150 mg total) by mouth every 3  (three) days. For three doses 3 tablet 0   ibuprofen (ADVIL) 800 MG tablet Take 1 tablet (800 mg total) by mouth every 8 (eight) hours as needed. 30 tablet 5   methocarbamol (ROBAXIN) 500 MG tablet Take 1 tablet (500 mg total) by mouth 3 (three) times daily. 90 tablet 0   pantoprazole (PROTONIX) 40 MG tablet Take 1 tablet (40 mg total) by mouth daily. 30 tablet 11   Prenat-Fe Poly-Methfol-FA-DHA (VITAFOL FE+) 90-0.6-0.4-200 MG CAPS Take 1 tablet by mouth daily. 30 capsule 11   sucralfate (CARAFATE) 1 g tablet Take 1 tablet (1 g total) by mouth 4 (four) times daily -  with meals and at bedtime. 120 tablet 1   Ubrogepant (UBRELVY) 100 MG TABS Take 1 tablet by mouth as needed (May repeat in 2 hours.  maximum 2 tablets in 24 hours.). 16 tablet 5   No current facility-administered medications for this visit.      Objective:     Vitals:   02/21/21 1551  BP: 90/70  Pulse: 73  SpO2: 99%  Weight: 125 lb (56.7 kg)  Height: 5\' 3"  (1.6 m)      Body mass index is 22.14 kg/m.    Physical Exam:     General: Well-appearing, cooperative, sitting comfortably in no acute distress.   OMT Physical Exam:  ASIS Compression Test: Positive Right Cervical: TTP paraspinal, C3 RRSL, C5 RRSL Rib: Bilateral elevated first rib with TTP Thoracic: TTP paraspinal, T4-8 RRSL Lumbar: TTP paraspinal, L2 RLSL Pelvis: Right anterior innominate  Electronically signed by:  Benito Mccreedy D.[facility] 4:28 PM 02/21/21

## 2021-02-21 ENCOUNTER — Ambulatory Visit (HOSPITAL_BASED_OUTPATIENT_CLINIC_OR_DEPARTMENT_OTHER): Payer: 59 | Admitting: Physical Therapy

## 2021-02-21 ENCOUNTER — Ambulatory Visit (INDEPENDENT_AMBULATORY_CARE_PROVIDER_SITE_OTHER): Payer: 59 | Admitting: Sports Medicine

## 2021-02-21 ENCOUNTER — Other Ambulatory Visit: Payer: Self-pay

## 2021-02-21 ENCOUNTER — Encounter (HOSPITAL_BASED_OUTPATIENT_CLINIC_OR_DEPARTMENT_OTHER): Payer: Self-pay | Admitting: Physical Therapy

## 2021-02-21 VITALS — BP 90/70 | HR 73 | Ht 63.0 in | Wt 125.0 lb

## 2021-02-21 DIAGNOSIS — R262 Difficulty in walking, not elsewhere classified: Secondary | ICD-10-CM

## 2021-02-21 DIAGNOSIS — M9901 Segmental and somatic dysfunction of cervical region: Secondary | ICD-10-CM | POA: Diagnosis not present

## 2021-02-21 DIAGNOSIS — M9903 Segmental and somatic dysfunction of lumbar region: Secondary | ICD-10-CM | POA: Diagnosis not present

## 2021-02-21 DIAGNOSIS — M25561 Pain in right knee: Secondary | ICD-10-CM

## 2021-02-21 DIAGNOSIS — G8929 Other chronic pain: Secondary | ICD-10-CM

## 2021-02-21 DIAGNOSIS — M9902 Segmental and somatic dysfunction of thoracic region: Secondary | ICD-10-CM | POA: Diagnosis not present

## 2021-02-21 DIAGNOSIS — M6281 Muscle weakness (generalized): Secondary | ICD-10-CM

## 2021-02-21 DIAGNOSIS — G4486 Cervicogenic headache: Secondary | ICD-10-CM | POA: Diagnosis not present

## 2021-02-21 DIAGNOSIS — M9908 Segmental and somatic dysfunction of rib cage: Secondary | ICD-10-CM

## 2021-02-21 DIAGNOSIS — M9905 Segmental and somatic dysfunction of pelvic region: Secondary | ICD-10-CM

## 2021-02-21 NOTE — Patient Instructions (Addendum)
Good to see you   Follow up in 2 weeks follow up for repeat OMT

## 2021-02-21 NOTE — Therapy (Signed)
OUTPATIENT PHYSICAL THERAPY TREATMENT NOTE   Patient Name: FATEN FRIESON MRN: [id number] DOB:1971-01-26, 50 y.o., female Today's Date: 02/21/2021  PCP: Patient, No Pcp Per (Inactive) REFERRING PROVIDER: Vanetta Mulders, MD   PT End of Session - 02/21/21 1349     Visit Number 6    Number of Visits 17    Date for PT Re-Evaluation 03/18/21    Authorization Type [employer]    PT Start Time 1347    PT Stop Time 1429    PT Time Calculation (min) 42 min    Activity Tolerance Patient tolerated treatment well    Behavior During Therapy [facility] for tasks assessed/performed              Past Medical History:  Diagnosis Date   Abdominal pain, epigastric 11/20/2019   Asthma    as a child   Cervical dysplasia    Gastroesophageal reflux disease 11/20/2019   Genetic testing 02/01/2018   Genetic testing was performed in 2015. The [employer] panel was ordered. In total, 23 genes were analyzed as part of this panel: ATM, BARD1, BRCA1, BRCA2, BRIP1, CDH1, CHEK2, EPCAM, MLH1, MRE11A, MSH2, MSH6, MUTYH, NBN, NF1, PALB2, PMS2, PTEN, RAD50, RAD51C, RAD51D, STK11, and TP53. No pathogenic variants were detected. Genetic testing did detect a Variant of Unknown Significance (VUS) at the t   GERD (gastroesophageal reflux disease)    had during chemo   Headache 06/12/2013   History of chemotherapy    History of kidney stones    History of radiation therapy 11/24/13- 01/08/14   reconstructed right breast/chest wall 4500 cGy 25 sessions, electron beam boost 1440 cGy 8 sessions   Liver masses 12/06/2016   Major depressive disorder, in remission 03/12/2017   Malignant neoplasm of lower-inner quadrant of right breast of female, estrogen receptor positive 2012   Pneumonia    PONV (postoperative nausea and vomiting)    Recurrent cancer of right breast 12/22/2013   Thrombocytopenia 05/22/2013   Past Surgical History:  Procedure Laterality Date   ADENOIDECTOMY     BREAST LUMPECTOMY WITH NEEDLE  LOCALIZATION Right 05/05/2013   Procedure: EXCISION RECURRENT CANCER RIGHT BREAST WITH NEEDLE LOCALOZATION;  Surgeon: Adin Hector, MD;  Location: [place];  Service: General;  Laterality: Right;   BREAST RECONSTRUCTION  06/29/2011   Procedure: BREAST RECONSTRUCTION;  Surgeon: Theodoro Kos, DO;  Location: [place];  Service: Plastics;  Laterality: Bilateral;  Immediate Bilateral Breast Reconstruction with Bilateral Tissue Expanders and placement of  Alloderm    DILATION AND CURETTAGE OF UTERUS     fibroid tumor surgery     lazy eye     corrective surgery   MASTECTOMY W/ SENTINEL NODE BIOPSY  06/29/2011   Procedure: MASTECTOMY WITH SENTINEL LYMPH NODE BIOPSY;  Surgeon: Adin Hector, MD;  Location: [place];  Service: General;  Laterality: Right;  right skin spairing mstectomy and right sentinel lymph node biopsy   PORT-A-CATH REMOVAL  03/12/2012   Procedure: MINOR REMOVAL PORT-A-CATH;  Surgeon: Adin Hector, MD;  Location: [place];  Service: General;  Laterality: N/A;  Upper left   PORTACATH PLACEMENT Left 05/05/2013   Procedure: INSERTION PORT-A-CATH;  Surgeon: Adin Hector, MD;  Location: [place];  Service: General;  Laterality: Left;   RHINOPLASTY     uterine cervical treatments  2002   for precancerous lesions   Patient Active Problem List   Diagnosis Date Noted   Gastroesophageal reflux disease 11/20/2019   Abdominal pain, epigastric 11/20/2019  Major depressive disorder, in remission 03/12/2017   Liver masses 12/06/2016   Uterine cramping 01/08/2014   Recurrent cancer of right breast 12/22/2013   Family history of malignant neoplasm of ovary 07/25/2013   Family history of malignant neoplasm of breast 07/25/2013   Family history of malignant neoplasm of gastrointestinal tract 07/25/2013   Headache 06/12/2013   Nausea alone 06/12/2013   Thrombocytopenia 05/22/2013   Status post bilateral breast reconstruction 01/05/2012   Acquired absence of bilateral breasts  and nipples 11/23/2011   Malignant neoplasm of lower-inner quadrant of right breast of female, estrogen receptor positive 2012    REFERRING DIAG: M54.50,G89.29 (ICD-10-CM) - Chronic low back pain, unspecified back pain laterality, unspecified whether sciatica present M54.2 (ICD-10-CM) - Cervical pain (neck)   THERAPY DIAG:  Chronic pain of right knee  Difficulty in walking, not elsewhere classified  Muscle weakness (generalized)  PERTINENT HISTORY: h/o breast CA with reconstruction, fat removal from bil thighs, migraines  PRECAUTIONS: none   SUBJECTIVE: My neck is tight and I have a HA. I was feeling tight so I tried stretching but made it tighter.   PAIN:  Are you having pain? Yes VAS scale: 5/10 Pain location: cervical Pain orientation: Bilateral  PAIN TYPE: aching and tension Pain description: intermittent  Aggravating factors: sitting unsupported, leaning fwd Relieving factors: lay down    OBJECTIVE:  DIAGNOSTIC FINDINGS:  Cervical & lumbar MRI 10/13   PATIENT SURVEYS:  ODI: 17 NDI: 21     COGNITION:          Overall cognitive status: Within functional limits for tasks assessed                        SENSATION:          Light touch: occasional N/T in hands, pain into legs when back hurts             POSTURE:  Slouched posture in seated- able to obtain upright position but trunk is anteriorly placed over pelvis, bil scapular winging, bil shoulder elevation and forward rounded.    LUMBARAROM/PROM   A/PROM A/PROM  01/21/2021  Flexion    Extension    Right lateral flexion    Left lateral flexion    Right rotation    Left rotation     (Blank rows = not tested)   LE AROM/PROM:   A/PROM Right 01/21/2021 Left 01/21/2021  Hip flexion      Hip extension      Hip abduction      Hip adduction      Hip internal rotation      Hip external rotation      Knee flexion      Knee extension      Ankle dorsiflexion      Ankle plantarflexion      Ankle  inversion      Ankle eversion       (Blank rows = not tested)   LE MMT:   MMT Right 01/21/2021 Left 01/21/2021  Hip flexion      Hip extension      Hip abduction      Hip adduction      Hip internal rotation      Hip external rotation      Knee flexion      Knee extension      Ankle dorsiflexion      Ankle plantarflexion      Ankle inversion  Ankle eversion       (Blank rows = not tested)     GAIT: Pattern WFL, limited endurance due to pain    TODAY'S TREATMENT: 11/14  Cervical rotation SNAG  Active cervical motions/stretches with avoiding extension motion  TPDN with skilled palpation and monitoring (bil upper traps), STM following, traction, gross stretching, lateral mobilizations Rt to Lt    11/10  AquaticREHABdocumentation: Water will provide increased arousal using the property of surface tension as this patient struggles with lethargy which impairs the cognitive processing., Water will aid with movement using the current and laminar flow while the buoyancy reduces weight bearing, and Hydrostatic pressure also supports joints by unweighting joint load by at least 50 % in 3-4 feet depth water. 80% in chest to neck deep water.  Entered pool via stairs without use of hand rail, depth up to 4', temp 95 deg.   Forward walking with abdominal engagement Manual STM to suboccipitals with mobile floating for stretch and joint mobility; also performed with bilateral QLs  Float planks with belt under pelvis  Knee/elbow planks on steps  11/4  AquaticREHABdocumentation: Water will provide increased arousal using the property of surface tension as this patient struggles with lethargy which impairs the cognitive processing., Water will aid with movement using the current and laminar flow while the buoyancy reduces weight bearing, and Hydrostatic pressure also supports joints by unweighting joint load by at least 50 % in 3-4 feet depth water. 80% in chest to neck deep water. Pt  entered pool via stairs, depth up to 4', temp 94 deg.  Walking with arms to side for chest stretch  Backward walking- arms to side for resisted scap retraction  Triceps press down & straight arm press down with dumbbells  Both hands on one dumbbell- hip hinge with press down  Triceps press ups from seated on bench   Added V sit, curl ins   Altered to press down on water bar for decreased stability  Manual tehrapy: STM to bil upper traps  11/1 AquaticREHABdocumentation: Pt is unable to tolerate land due to pain or inability to move freely on land without pain and substitution/compensations , Water will provide increased arousal using the property of surface tension as this patient struggles with lethargy which impairs the cognitive processing., Pt requires the buoyancy of water for active assisted exercises with buoyancy supported for strengthening & ROM exercises, and Hydrostatic pressure also supports joints by unweighting joint load by at least 50 % in 3-4 feet depth water. 80% in chest to neck deep water.  Entered pool via stairs, depth up to 4', temp 94 deg Supine suspended mobilization of cervical region with STM to bil upper traps, cervical paraspinals and suboccipitals. Cervical distraction pulling through water.  Aqua stretch techniques to decrease muscle tension and tightness upper traps Bad Ragaz, Pt with lumbar belt around hips and nek doodle for neck support, ankle buoys and noodles .  Pt assisted into supine floating position by lying head on shoulder of PT to get into floating position. . PT at torso and assisting with trunk left to right and vice versa to engage trunk muscles. PT then rotated trunk in order to engage abdomnal (internal and external obliques). Side bending stretching -PPT x 10 verbal and TC for proper execution -Hip extension x 5 bilat -knee flexion x 5 bilat in above position -hip/knee flex with hold for LB stretch 3 x 30  second  10/25: AquaticREHABdocumentation: Pt is unable to tolerate land due to  pain or inability to move freely on land without pain and substitution/compensations , Water will provide increased arousal using the property of surface tension as this patient struggles with lethargy which impairs the cognitive processing., Pt requires the buoyancy of water for active assisted exercises with buoyancy supported for strengthening & ROM exercises, and Hydrostatic pressure also supports joints by unweighting joint load by at least 50 % in 3-4 feet depth water. 80% in chest to neck deep water.   Entered pool via stairs, depth up to 4', temp 94 deg             Suspended mobilization from cervical region with STM to bil upper traps, cervical paraspinals and suboccipitals.  Standing trunk rotation Wall push ups Triceps press up from seated with scap retraction   Eval: Correct postural alignment Scap retraction  Upper trap & levator stretch Lumbar flexion stretch in seated  PATIENT EDUCATION:  Education details: discussed MRI reports   Person educated: Patient Education method: Explanation Education comprehension: verbalized understanding     HOME EXERCISE PROGRAM: [id number]   ASSESSMENT:   CLINICAL IMPRESSION: Was tending to stretch into extension at 0 rotation as well as at end range rotation each direction which was contributing to sensation of tightness. Introduced DN to treatment today which produced some soreness as expected. Is seeing DO today for adjustments. Will cont DN as is appropriate and she will benefit from progression of periscapular and lumbopelvic strengthening to reduce return of tension.   We will work in the pool for the first 4 weeks of treatment to decrease pain with exercise and gain tolerance to progress to land-based exercises. Pt c/o symptoms consistent with postural hypotension and will monitor with exercise.    REHAB POTENTIAL: Good   CLINICAL DECISION MAKING:  Evolving/moderate complexity   EVALUATION COMPLEXITY: Moderate     GOALS: Goals reviewed with patient? Yes   SHORT TERM GOALS:   STG Name Target Date Goal status  1 Pt will be able to demo proper resting postural alignment Baseline: still has to correct when she remembers but is able to correct 02/11/2021 achieved  2 Pt will be independent in daily stretching program  Baseline: achieved at this time 02/11/2021 achieved                                                 LONG TERM GOALS:    LTG Name Target Date Goal status  1 ODI to improve my MDC of 12 points Baseline: scored 17 points at eval 03/18/2021 INITIAL  2 NDI to improve by MDC of 10 points Baseline: scored 21 points at eval 03/18/2021 INITIAL  3 Pt will be able to bend from a standing position to reach objects on floor with minimal to no discomfort Baseline:very limited at eval 03/18/2021 INITIAL  4 Pt will be able to stand to wash dishes  Baseline: will work on recognition of necessary postural changes and breaks to improve endurance 03/18/2021 INITIAL  5 Pt will be independent in long term exercise to continue advancing strength Baseline:will progress as appropriate 03/18/2021 INITIAL                      PLAN: PT FREQUENCY: 2x/week   PT DURATION: 8 weeks   PLANNED INTERVENTIONS: Therapeutic exercises, Therapeutic activity, Neuro Muscular re-education, Balance training, Gait training, Patient/Family  education, Engineer, manufacturing, Stair training, Aquatic Therapy, Dry Needling, Spinal mobilization, Cryotherapy, Moist heat, and Manual therapy   PLAN FOR NEXT SESSION: anterior chest flexibility, periscap activation; core engagement to decrease lumbar spasm, hip hinge/bending tolerance, TPDN   Vlad Mayberry C. Yui Mulvaney PT, DPT 02/21/21 3:20 PM

## 2021-02-22 ENCOUNTER — Encounter: Payer: Self-pay | Admitting: *Deleted

## 2021-02-23 ENCOUNTER — Telehealth: Payer: Self-pay | Admitting: Physical Medicine and Rehabilitation

## 2021-02-23 ENCOUNTER — Encounter (HOSPITAL_BASED_OUTPATIENT_CLINIC_OR_DEPARTMENT_OTHER): Payer: Self-pay | Admitting: Physical Therapy

## 2021-02-23 ENCOUNTER — Other Ambulatory Visit: Payer: Self-pay

## 2021-02-23 ENCOUNTER — Ambulatory Visit (HOSPITAL_BASED_OUTPATIENT_CLINIC_OR_DEPARTMENT_OTHER): Payer: 59 | Admitting: Physical Therapy

## 2021-02-23 DIAGNOSIS — R262 Difficulty in walking, not elsewhere classified: Secondary | ICD-10-CM

## 2021-02-23 DIAGNOSIS — M545 Low back pain, unspecified: Secondary | ICD-10-CM

## 2021-02-23 DIAGNOSIS — M6281 Muscle weakness (generalized): Secondary | ICD-10-CM

## 2021-02-23 DIAGNOSIS — M25561 Pain in right knee: Secondary | ICD-10-CM | POA: Diagnosis not present

## 2021-02-23 DIAGNOSIS — M542 Cervicalgia: Secondary | ICD-10-CM

## 2021-02-23 DIAGNOSIS — G8929 Other chronic pain: Secondary | ICD-10-CM

## 2021-02-23 NOTE — Telephone Encounter (Signed)
Patient called needing to cancel her appointment and reschedule. The number to contact patient is [phone]

## 2021-02-23 NOTE — Telephone Encounter (Signed)
Appointment cancelled. Messaged through mychart to let us know when ready to reschedule. Needs to be 2 weeks after 12/5 appointment.

## 2021-02-23 NOTE — Therapy (Signed)
OUTPATIENT PHYSICAL THERAPY TREATMENT NOTE   Patient Name: [name] MRN: [id number] DOB:[birth year], 50 y.o., female Today's Date: 02/23/2021  PCP: Patient, No Pcp Per (Inactive) REFERRING PROVIDER: Vanetta Mulders, MD   PT End of Session - 02/23/21 1401     Visit Number 7    Number of Visits 17    Date for PT Re-Evaluation 03/18/21    Authorization Type [employer]    PT Start Time 1400    PT Stop Time 1430    PT Time Calculation (min) 30 min    Activity Tolerance Patient tolerated treatment well    Behavior During Therapy [facility] for tasks assessed/performed               Past Medical History:  Diagnosis Date   Abdominal pain, epigastric 11/20/2019   Asthma    as a child   Cervical dysplasia    Gastroesophageal reflux disease 11/20/2019   Genetic testing 02/01/2018   Genetic testing was performed in 2015. The [employer] panel was ordered. In total, 23 genes were analyzed as part of this panel: ATM, BARD1, BRCA1, BRCA2, BRIP1, CDH1, CHEK2, EPCAM, MLH1, MRE11A, MSH2, MSH6, MUTYH, NBN, NF1, PALB2, PMS2, PTEN, RAD50, RAD51C, RAD51D, STK11, and TP53. No pathogenic variants were detected. Genetic testing did detect a Variant of Unknown Significance (VUS) at the t   GERD (gastroesophageal reflux disease)    had during chemo   Headache 06/12/2013   History of chemotherapy    History of kidney stones    History of radiation therapy 11/24/13- 01/08/14   reconstructed right breast/chest wall 4500 cGy 25 sessions, electron beam boost 1440 cGy 8 sessions   Liver masses 12/06/2016   Major depressive disorder, in remission 03/12/2017   Malignant neoplasm of lower-inner quadrant of right breast of female, estrogen receptor positive 2012   Pneumonia    PONV (postoperative nausea and vomiting)    Recurrent cancer of right breast 12/22/2013   Thrombocytopenia 05/22/2013   Past Surgical History:  Procedure Laterality Date   ADENOIDECTOMY     BREAST LUMPECTOMY WITH NEEDLE  LOCALIZATION Right 05/05/2013   Procedure: EXCISION RECURRENT CANCER RIGHT BREAST WITH NEEDLE LOCALOZATION;  Surgeon: Adin Hector, MD;  Location: [place];  Service: General;  Laterality: Right;   BREAST RECONSTRUCTION  06/29/2011   Procedure: BREAST RECONSTRUCTION;  Surgeon: Theodoro Kos, DO;  Location: [place];  Service: Plastics;  Laterality: Bilateral;  Immediate Bilateral Breast Reconstruction with Bilateral Tissue Expanders and placement of  Alloderm    DILATION AND CURETTAGE OF UTERUS     fibroid tumor surgery     lazy eye     corrective surgery   MASTECTOMY W/ SENTINEL NODE BIOPSY  06/29/2011   Procedure: MASTECTOMY WITH SENTINEL LYMPH NODE BIOPSY;  Surgeon: Adin Hector, MD;  Location: [place];  Service: General;  Laterality: Right;  right skin spairing mstectomy and right sentinel lymph node biopsy   PORT-A-CATH REMOVAL  03/12/2012   Procedure: MINOR REMOVAL PORT-A-CATH;  Surgeon: Adin Hector, MD;  Location: [place];  Service: General;  Laterality: N/A;  Upper left   PORTACATH PLACEMENT Left 05/05/2013   Procedure: INSERTION PORT-A-CATH;  Surgeon: Adin Hector, MD;  Location: [place];  Service: General;  Laterality: Left;   RHINOPLASTY     uterine cervical treatments  2002   for precancerous lesions   Patient Active Problem List   Diagnosis Date Noted   Gastroesophageal reflux disease 11/20/2019   Abdominal pain, epigastric  11/20/2019   Major depressive disorder, in remission 03/12/2017   Liver masses 12/06/2016   Uterine cramping 01/08/2014   Recurrent cancer of right breast 12/22/2013   Family history of malignant neoplasm of ovary 07/25/2013   Family history of malignant neoplasm of breast 07/25/2013   Family history of malignant neoplasm of gastrointestinal tract 07/25/2013   Headache 06/12/2013   Nausea alone 06/12/2013   Thrombocytopenia 05/22/2013   Status post bilateral breast reconstruction 01/05/2012   Acquired absence of bilateral breasts  and nipples 11/23/2011   Malignant neoplasm of lower-inner quadrant of right breast of female, estrogen receptor positive 2012    REFERRING DIAG: M54.50,G89.29 (ICD-10-CM) - Chronic low back pain, unspecified back pain laterality, unspecified whether sciatica present M54.2 (ICD-10-CM) - Cervical pain (neck)   THERAPY DIAG:  Chronic pain of right knee  Difficulty in walking, not elsewhere classified  Muscle weakness (generalized)  Cervicalgia  Pain, lumbar region  PERTINENT HISTORY: h/o breast CA with reconstruction, fat removal from bil thighs, migraines  PRECAUTIONS: none   SUBJECTIVE:Pt states she has a lot of tightness and soreness in the neck. She states she felt better after last session with the DN. She still had pain down the legs.  PAIN:  Are you having pain? Yes VAS scale: 5/10 Pain location: cervical Pain orientation: Bilateral  PAIN TYPE: aching and tension Pain description: intermittent  Aggravating factors: sitting unsupported, leaning fwd Relieving factors: lay down    OBJECTIVE:  DIAGNOSTIC FINDINGS:  Cervical & lumbar MRI 10/13   PATIENT SURVEYS:  ODI: 17 NDI: 21     COGNITION:          Overall cognitive status: Within functional limits for tasks assessed                        SENSATION:          Light touch: occasional N/T in hands, pain into legs when back hurts             POSTURE:  Slouched posture in seated- able to obtain upright position but trunk is anteriorly placed over pelvis, bil scapular winging, bil shoulder elevation and forward rounded.    LUMBARAROM/PROM   A/PROM A/PROM  01/21/2021  Flexion    Extension    Right lateral flexion    Left lateral flexion    Right rotation    Left rotation     (Blank rows = not tested)   LE AROM/PROM:   A/PROM Right 01/21/2021 Left 01/21/2021  Hip flexion      Hip extension      Hip abduction      Hip adduction      Hip internal rotation      Hip external rotation      Knee  flexion      Knee extension      Ankle dorsiflexion      Ankle plantarflexion      Ankle inversion      Ankle eversion       (Blank rows = not tested)   LE MMT:   MMT Right 01/21/2021 Left 01/21/2021  Hip flexion      Hip extension      Hip abduction      Hip adduction      Hip internal rotation      Hip external rotation      Knee flexion      Knee extension      Ankle dorsiflexion  Ankle plantarflexion      Ankle inversion      Ankle eversion       (Blank rows = not tested)     GAIT: Pattern WFL, limited endurance due to pain    TODAY'S TREATMENT:  11/16  Chin tucks 1s 10x (VC and TC needed for acceptable range and technique)  YTB external rotation with scap retraction 2x10   TPDN with skilled palpation and monitoring (bil upper traps),  STM following, C2-5 CPA grade II-III  11/14  Cervical rotation SNAG  Active cervical motions/stretches with avoiding extension motion  TPDN with skilled palpation and monitoring (bil upper traps), STM following, traction, gross stretching, lateral mobilizations Rt to Lt    11/10  AquaticREHABdocumentation: Water will provide increased arousal using the property of surface tension as this patient struggles with lethargy which impairs the cognitive processing., Water will aid with movement using the current and laminar flow while the buoyancy reduces weight bearing, and Hydrostatic pressure also supports joints by unweighting joint load by at least 50 % in 3-4 feet depth water. 80% in chest to neck deep water.  Entered pool via stairs without use of hand rail, depth up to 4', temp 95 deg.   Forward walking with abdominal engagement Manual STM to suboccipitals with mobile floating for stretch and joint mobility; also performed with bilateral QLs  Float planks with belt under pelvis  Knee/elbow planks on steps  11/4  AquaticREHABdocumentation: Water will provide increased arousal using the property of surface tension as this  patient struggles with lethargy which impairs the cognitive processing., Water will aid with movement using the current and laminar flow while the buoyancy reduces weight bearing, and Hydrostatic pressure also supports joints by unweighting joint load by at least 50 % in 3-4 feet depth water. 80% in chest to neck deep water. Pt entered pool via stairs, depth up to 4', temp 94 deg.  Walking with arms to side for chest stretch  Backward walking- arms to side for resisted scap retraction  Triceps press down & straight arm press down with dumbbells  Both hands on one dumbbell- hip hinge with press down  Triceps press ups from seated on bench   Added V sit, curl ins   Altered to press down on water bar for decreased stability  Manual tehrapy: STM to bil upper traps  11/1 AquaticREHABdocumentation: Pt is unable to tolerate land due to pain or inability to move freely on land without pain and substitution/compensations , Water will provide increased arousal using the property of surface tension as this patient struggles with lethargy which impairs the cognitive processing., Pt requires the buoyancy of water for active assisted exercises with buoyancy supported for strengthening & ROM exercises, and Hydrostatic pressure also supports joints by unweighting joint load by at least 50 % in 3-4 feet depth water. 80% in chest to neck deep water.  Entered pool via stairs, depth up to 4', temp 94 deg Supine suspended mobilization of cervical region with STM to bil upper traps, cervical paraspinals and suboccipitals. Cervical distraction pulling through water.  Aqua stretch techniques to decrease muscle tension and tightness upper traps [place] Ragaz, Pt with lumbar belt around hips and nek doodle for neck support, ankle buoys and noodles .  Pt assisted into supine floating position by lying head on shoulder of PT to get into floating position. . PT at torso and assisting with trunk left to right and vice versa to  engage trunk muscles. PT then rotated  trunk in order to engage abdomnal (internal and external obliques). Side bending stretching -PPT x 10 verbal and TC for proper execution -Hip extension x 5 bilat -knee flexion x 5 bilat in above position -hip/knee flex with hold for LB stretch 3 x 30 second  10/25: AquaticREHABdocumentation: Pt is unable to tolerate land due to pain or inability to move freely on land without pain and substitution/compensations , Water will provide increased arousal using the property of surface tension as this patient struggles with lethargy which impairs the cognitive processing., Pt requires the buoyancy of water for active assisted exercises with buoyancy supported for strengthening & ROM exercises, and Hydrostatic pressure also supports joints by unweighting joint load by at least 50 % in 3-4 feet depth water. 80% in chest to neck deep water.   Entered pool via stairs, depth up to 4', temp 94 deg             Suspended mobilization from cervical region with STM to bil upper traps, cervical paraspinals and suboccipitals.  Standing trunk rotation Wall push ups Triceps press up from seated with scap retraction   Eval: Correct postural alignment Scap retraction  Upper trap & levator stretch Lumbar flexion stretch in seated  PATIENT EDUCATION:  Education details: discussed MRI reports   Person educated: Patient Education method: Explanation Education comprehension: verbalized understanding     HOME EXERCISE PROGRAM: [id number]   ASSESSMENT:   CLINICAL IMPRESSION: Pt with good response to manual therapy, DN, and exercise at today's session. Pt with improved cervical rotation ROM today and was able to incorporate resisted postural strengthening. Plan to continue at next session. Pt session limited by time due to MD visit that ran long. Pt would benefit from continued skilled therapy in order to reach goals and maximize functional postural strength and ROM for full  return independent function.     REHAB POTENTIAL: Good   CLINICAL DECISION MAKING: Evolving/moderate complexity   EVALUATION COMPLEXITY: Moderate     GOALS: Goals reviewed with patient? Yes   SHORT TERM GOALS:   STG Name Target Date Goal status  1 Pt will be able to demo proper resting postural alignment Baseline: still has to correct when she remembers but is able to correct 02/11/2021 achieved  2 Pt will be independent in daily stretching program  Baseline: achieved at this time 02/11/2021 achieved                                                 LONG TERM GOALS:    LTG Name Target Date Goal status  1 ODI to improve my MDC of 12 points Baseline: scored 17 points at eval 03/18/2021 INITIAL  2 NDI to improve by MDC of 10 points Baseline: scored 21 points at eval 03/18/2021 INITIAL  3 Pt will be able to bend from a standing position to reach objects on floor with minimal to no discomfort Baseline:very limited at eval 03/18/2021 INITIAL  4 Pt will be able to stand to wash dishes  Baseline: will work on recognition of necessary postural changes and breaks to improve endurance 03/18/2021 INITIAL  5 Pt will be independent in long term exercise to continue advancing strength Baseline:will progress as appropriate 03/18/2021 INITIAL                      PLAN: PT  FREQUENCY: 2x/week   PT DURATION: 8 weeks   PLANNED INTERVENTIONS: Therapeutic exercises, Therapeutic activity, Neuro Muscular re-education, Balance training, Gait training, Patient/Family education, Joint mobilization, Stair training, Aquatic Therapy, Dry Needling, Spinal mobilization, Cryotherapy, Moist heat, and Manual therapy   PLAN FOR NEXT SESSION: anterior chest flexibility, periscap activation; core engagement to decrease lumbar spasm, hip hinge/bending tolerance, TPDN   Daleen Bo PT, DPT 02/23/21 3:56 PM

## 2021-02-24 ENCOUNTER — Ambulatory Visit: Payer: 59 | Admitting: Physical Medicine and Rehabilitation

## 2021-02-25 ENCOUNTER — Encounter: Payer: Self-pay | Admitting: Internal Medicine

## 2021-02-25 ENCOUNTER — Ambulatory Visit (INDEPENDENT_AMBULATORY_CARE_PROVIDER_SITE_OTHER): Payer: 59 | Admitting: Internal Medicine

## 2021-02-25 ENCOUNTER — Telehealth: Payer: Self-pay | Admitting: Physical Medicine and Rehabilitation

## 2021-02-25 ENCOUNTER — Other Ambulatory Visit: Payer: Self-pay

## 2021-02-25 VITALS — BP 112/80 | HR 66 | Resp 18 | Ht 63.0 in | Wt 130.0 lb

## 2021-02-25 DIAGNOSIS — R160 Hepatomegaly, not elsewhere classified: Secondary | ICD-10-CM

## 2021-02-25 DIAGNOSIS — M542 Cervicalgia: Secondary | ICD-10-CM | POA: Diagnosis not present

## 2021-02-25 DIAGNOSIS — G8929 Other chronic pain: Secondary | ICD-10-CM

## 2021-02-25 DIAGNOSIS — M549 Dorsalgia, unspecified: Secondary | ICD-10-CM

## 2021-02-25 DIAGNOSIS — D696 Thrombocytopenia, unspecified: Secondary | ICD-10-CM | POA: Diagnosis not present

## 2021-02-25 LAB — RENAL FUNCTION PANEL
Albumin: 4.5 g/dL (ref 3.5–5.2)
BUN: 13 mg/dL (ref 6–23)
CO2: 30 mEq/L (ref 19–32)
Calcium: 9.5 mg/dL (ref 8.4–10.5)
Chloride: 103 mEq/L (ref 96–112)
Creatinine, Ser: 0.77 mg/dL (ref 0.40–1.20)
GFR: 89.42 mL/min (ref >60.00)
Glucose, Bld: 83 mg/dL (ref 70–99)
Phosphorus: 3.8 mg/dL (ref 2.3–4.6)
Potassium: 4.1 mEq/L (ref 3.5–5.1)
Sodium: 140 mEq/L (ref 135–145)

## 2021-02-25 LAB — VITAMIN D 25 HYDROXY (VIT D DEFICIENCY, FRACTURES): VITD: 73.38 ng/mL (ref 30.00–100.00)

## 2021-02-25 LAB — LIPID PANEL
Cholesterol: 198 mg/dL (ref 0–200)
HDL: 91.7 mg/dL (ref >39.00)
LDL Cholesterol: 94 mg/dL (ref 0–99)
NonHDL: 106.75
Total CHOL/HDL Ratio: 2
Triglycerides: 63 mg/dL (ref 0.0–149.0)
VLDL: 12.6 mg/dL (ref 0.0–40.0)

## 2021-02-25 LAB — HEPATIC FUNCTION PANEL
ALT: 13 U/L (ref 0–35)
AST: 17 U/L (ref 0–37)
Albumin: 4.5 g/dL (ref 3.5–5.2)
Alkaline Phosphatase: 84 U/L (ref 39–117)
Bilirubin, Direct: 0.1 mg/dL (ref 0.0–0.3)
Total Bilirubin: 0.4 mg/dL (ref 0.2–1.2)
Total Protein: 6.7 g/dL (ref 6.0–8.3)

## 2021-02-25 LAB — CBC WITH DIFFERENTIAL/PLATELET
Basophils Absolute: 0 10*3/uL (ref 0.0–0.1)
Basophils Relative: 0.5 % (ref 0.0–3.0)
Eosinophils Absolute: 0.1 10*3/uL (ref 0.0–0.7)
Eosinophils Relative: 1.7 % (ref 0.0–5.0)
HCT: 38.9 % (ref 36.0–46.0)
Hemoglobin: 12.9 g/dL (ref 12.0–15.0)
Lymphocytes Relative: 32.5 % (ref 12.0–46.0)
Lymphs Abs: 2.3 10*3/uL (ref 0.7–4.0)
MCHC: 33.3 g/dL (ref 30.0–36.0)
MCV: 90.4 fl (ref 78.0–100.0)
Monocytes Absolute: 0.5 10*3/uL (ref 0.1–1.0)
Monocytes Relative: 7.5 % (ref 3.0–12.0)
Neutro Abs: 4.1 10*3/uL (ref 1.4–7.7)
Neutrophils Relative %: 57.8 % (ref 43.0–77.0)
Platelets: 273 10*3/uL (ref 150.0–400.0)
RBC: 4.3 Mil/uL (ref 3.87–5.11)
RDW: 12.8 % (ref 11.5–15.5)
WBC: 7.2 10*3/uL (ref 4.0–10.5)

## 2021-02-25 LAB — MAGNESIUM: Magnesium: 2 mg/dL (ref 1.5–2.5)

## 2021-02-25 LAB — CORTISOL: Cortisol, Plasma: 3.4 ug/dL

## 2021-02-25 LAB — VITAMIN B12: Vitamin B-12: 458 pg/mL (ref 211–911)

## 2021-02-25 LAB — FERRITIN: Ferritin: 41.9 ng/mL (ref 10.0–291.0)

## 2021-02-25 NOTE — Telephone Encounter (Signed)
Patient called needing an appointment with Dr Ernestina Patches for her lower back. The number to contact patient is [phone]

## 2021-02-25 NOTE — Patient Instructions (Signed)
We will check the labs today. 

## 2021-02-25 NOTE — Progress Notes (Signed)
   Subjective:   Patient ID: [name], female    DOB: 1970/09/18, 50 y.o.   MRN: [id number]  HPI The patient is a 50 YO new female coming in for several concerns.   PMH, [facility], social history reviewed and updated  Review of Systems  Constitutional:  Positive for activity change and fatigue.  HENT: Negative.    Eyes: Negative.   Respiratory:  Negative for cough, chest tightness and shortness of breath.   Cardiovascular:  Negative for chest pain, palpitations and leg swelling.  Gastrointestinal:  Negative for abdominal distention, abdominal pain, constipation, diarrhea, nausea and vomiting.  Musculoskeletal:  Positive for arthralgias, back pain and neck pain.  Skin: Negative.   Neurological: Negative.   Psychiatric/Behavioral: Negative.     Objective:  Physical Exam Constitutional:      Appearance: She is well-developed.  HENT:     Head: Normocephalic and atraumatic.  Cardiovascular:     Rate and Rhythm: Normal rate and regular rhythm.  Pulmonary:     Effort: Pulmonary effort is normal. No respiratory distress.     Breath sounds: Normal breath sounds. No wheezing or rales.  Abdominal:     General: Bowel sounds are normal. There is no distension.     Palpations: Abdomen is soft.     Tenderness: There is no abdominal tenderness. There is no rebound.  Musculoskeletal:        General: Tenderness present.     Cervical back: Normal range of motion.  Skin:    General: Skin is warm and dry.  Neurological:     Mental Status: She is alert and oriented to person, place, and time.     Coordination: Coordination normal.    Vitals:   02/25/21 1506  BP: 112/80  Pulse: 66  Resp: 18  SpO2: 98%  Weight: 130 lb (59 kg)  Height: 5\' 3"  (1.6 m)    This visit occurred during the SARS-CoV-2 public health emergency.  Safety protocols were in place, including screening questions prior to the visit, additional usage of staff PPE, and extensive cleaning of exam room while observing  appropriate contact time as indicated for disinfecting solutions.   Assessment & Plan:

## 2021-02-26 ENCOUNTER — Encounter: Payer: Self-pay | Admitting: Internal Medicine

## 2021-02-26 DIAGNOSIS — M549 Dorsalgia, unspecified: Secondary | ICD-10-CM | POA: Insufficient documentation

## 2021-02-26 DIAGNOSIS — M542 Cervicalgia: Secondary | ICD-10-CM | POA: Insufficient documentation

## 2021-02-26 NOTE — Assessment & Plan Note (Signed)
Will reach out to her oncologist provider as she had masses which were best appreciated on CT scan and 2021 was recommended for 3 month follow up multiphase CT which was never done. She did have a PET scan and those masses did not show up. It is unclear to me if she requires follow up imaging at this time. Checking hepatic function panel and renal function in case imaging is needed.

## 2021-02-26 NOTE — Assessment & Plan Note (Signed)
Checking CBC with diff today as well as ferritin and cortisol and B12 and magnesium and vitamin D.

## 2021-02-26 NOTE — Assessment & Plan Note (Signed)
Doing PT currently to see if this helps and is seeing sports medicine and spine specialist for definitive treatment of this.

## 2021-02-26 NOTE — Assessment & Plan Note (Signed)
We did discuss her recent imaging results and she is recommended to have epidural and has seen multiple specialists and is still working with them toward a definitive plan.

## 2021-02-28 ENCOUNTER — Encounter (HOSPITAL_BASED_OUTPATIENT_CLINIC_OR_DEPARTMENT_OTHER): Payer: Self-pay | Admitting: Physical Therapy

## 2021-02-28 ENCOUNTER — Ambulatory Visit (HOSPITAL_BASED_OUTPATIENT_CLINIC_OR_DEPARTMENT_OTHER): Payer: 59 | Admitting: Physical Therapy

## 2021-02-28 ENCOUNTER — Other Ambulatory Visit: Payer: Self-pay

## 2021-02-28 DIAGNOSIS — R262 Difficulty in walking, not elsewhere classified: Secondary | ICD-10-CM

## 2021-02-28 DIAGNOSIS — M25561 Pain in right knee: Secondary | ICD-10-CM | POA: Diagnosis not present

## 2021-02-28 DIAGNOSIS — M545 Low back pain, unspecified: Secondary | ICD-10-CM

## 2021-02-28 DIAGNOSIS — M542 Cervicalgia: Secondary | ICD-10-CM

## 2021-02-28 DIAGNOSIS — M6281 Muscle weakness (generalized): Secondary | ICD-10-CM

## 2021-02-28 NOTE — Therapy (Signed)
OUTPATIENT PHYSICAL THERAPY TREATMENT NOTE   Patient Name: [name] MRN: [id number] DOB:05/24/1970, 50 y.o., female Today's Date: 02/28/2021  PCP: Hoyt Koch, MD REFERRING PROVIDER: Vanetta Mulders, MD   PT End of Session - 02/28/21 1430     Visit Number 8    Number of Visits 17    Date for PT Re-Evaluation 03/18/21    Authorization Type [employer]    PT Start Time 1430    PT Stop Time 1510    PT Time Calculation (min) 40 min    Activity Tolerance Patient tolerated treatment well    Behavior During Therapy [facility] for tasks assessed/performed               Past Medical History:  Diagnosis Date   Abdominal pain, epigastric 11/20/2019   Asthma    as a child   Cervical dysplasia    Gastroesophageal reflux disease 11/20/2019   Genetic testing 02/01/2018   Genetic testing was performed in 2015. The [employer] panel was ordered. In total, 23 genes were analyzed as part of this panel: ATM, BARD1, BRCA1, BRCA2, BRIP1, CDH1, CHEK2, EPCAM, MLH1, MRE11A, MSH2, MSH6, MUTYH, NBN, NF1, PALB2, PMS2, PTEN, RAD50, RAD51C, RAD51D, STK11, and TP53. No pathogenic variants were detected. Genetic testing did detect a Variant of Unknown Significance (VUS) at the t   GERD (gastroesophageal reflux disease)    had during chemo   Headache 06/12/2013   History of chemotherapy    History of kidney stones    History of radiation therapy 11/24/13- 01/08/14   reconstructed right breast/chest wall 4500 cGy 25 sessions, electron beam boost 1440 cGy 8 sessions   Liver masses 12/06/2016   Major depressive disorder, in remission 03/12/2017   Malignant neoplasm of lower-inner quadrant of right breast of female, estrogen receptor positive 2012   Pneumonia    PONV (postoperative nausea and vomiting)    Recurrent cancer of right breast 12/22/2013   Thrombocytopenia 05/22/2013   Past Surgical History:  Procedure Laterality Date   ADENOIDECTOMY     BREAST LUMPECTOMY WITH NEEDLE LOCALIZATION  Right 05/05/2013   Procedure: EXCISION RECURRENT CANCER RIGHT BREAST WITH NEEDLE LOCALOZATION;  Surgeon: Adin Hector, MD;  Location: [place];  Service: General;  Laterality: Right;   BREAST RECONSTRUCTION  06/29/2011   Procedure: BREAST RECONSTRUCTION;  Surgeon: Theodoro Kos, DO;  Location: [place];  Service: Plastics;  Laterality: Bilateral;  Immediate Bilateral Breast Reconstruction with Bilateral Tissue Expanders and placement of  Alloderm    DILATION AND CURETTAGE OF UTERUS     fibroid tumor surgery     lazy eye     corrective surgery   MASTECTOMY W/ SENTINEL NODE BIOPSY  06/29/2011   Procedure: MASTECTOMY WITH SENTINEL LYMPH NODE BIOPSY;  Surgeon: Adin Hector, MD;  Location: [place];  Service: General;  Laterality: Right;  right skin spairing mstectomy and right sentinel lymph node biopsy   PORT-A-CATH REMOVAL  03/12/2012   Procedure: MINOR REMOVAL PORT-A-CATH;  Surgeon: Adin Hector, MD;  Location: [place];  Service: General;  Laterality: N/A;  Upper left   PORTACATH PLACEMENT Left 05/05/2013   Procedure: INSERTION PORT-A-CATH;  Surgeon: Adin Hector, MD;  Location: [place];  Service: General;  Laterality: Left;   RHINOPLASTY     uterine cervical treatments  2002   for precancerous lesions   Patient Active Problem List   Diagnosis Date Noted   Back pain 02/26/2021   Neck pain 02/26/2021  Gastroesophageal reflux disease 11/20/2019   Major depressive disorder, in remission 03/12/2017   Liver masses 12/06/2016   Recurrent cancer of right breast 12/22/2013   Family history of malignant neoplasm of ovary 07/25/2013   Family history of malignant neoplasm of breast 07/25/2013   Family history of malignant neoplasm of gastrointestinal tract 07/25/2013   Headache 06/12/2013   Thrombocytopenia 05/22/2013   Acquired absence of bilateral breasts and nipples 11/23/2011   Malignant neoplasm of lower-inner quadrant of right breast of female, estrogen receptor positive  2012    REFERRING DIAG: M54.50,G89.29 (ICD-10-CM) - Chronic low back pain, unspecified back pain laterality, unspecified whether sciatica present M54.2 (ICD-10-CM) - Cervical pain (neck)   THERAPY DIAG:  Difficulty in walking, not elsewhere classified  Cervicalgia  Muscle weakness (generalized)  Pain, lumbar region  PERTINENT HISTORY: h/o breast CA with reconstruction, fat removal from bil thighs, migraines  PRECAUTIONS: none   SUBJECTIVE:Pt states she has been doing strengthening. She woke up with pain at the base of her skull. Heat and ice didn't make a difference. Pt states she would like to focus on the low back today.   PAIN:  Are you having pain? Yes VAS scale: 5/10 Pain location: lumbar Pain orientation: Bilateral  PAIN TYPE: aching and tension Pain description: intermittent  Aggravating factors: sitting unsupported, leaning fwd Relieving factors: lay down    OBJECTIVE:  DIAGNOSTIC FINDINGS:  Cervical & lumbar MRI 10/13   PATIENT SURVEYS:  ODI: 17 NDI: 21        TODAY'S TREATMENT:  11/21  L1-5 CPA and UPA bilat grade IV C/S LAD grade II STM: bilat L/S paraspinal and R lower T/S paraspinal Suboccipital release  Child's pose 3s 10x Cat cow 10 each Open book 5s 10x  11/16  Chin tucks 1s 10x (VC and TC needed for acceptable range and technique)  YTB external rotation with scap retraction 2x10   TPDN with skilled palpation and monitoring (bil upper traps),  STM following, C2-5 CPA grade II-III  11/14  Cervical rotation SNAG  Active cervical motions/stretches with avoiding extension motion  TPDN with skilled palpation and monitoring (bil upper traps), STM following, traction, gross stretching, lateral mobilizations Rt to Lt    11/10  AquaticREHABdocumentation: Water will provide increased arousal using the property of surface tension as this patient struggles with lethargy which impairs the cognitive processing., Water will aid with movement using  the current and laminar flow while the buoyancy reduces weight bearing, and Hydrostatic pressure also supports joints by unweighting joint load by at least 50 % in 3-4 feet depth water. 80% in chest to neck deep water.  Entered pool via stairs without use of hand rail, depth up to 4', temp 95 deg.   Forward walking with abdominal engagement Manual STM to suboccipitals with mobile floating for stretch and joint mobility; also performed with bilateral QLs  Float planks with belt under pelvis  Knee/elbow planks on steps  11/4  AquaticREHABdocumentation: Water will provide increased arousal using the property of surface tension as this patient struggles with lethargy which impairs the cognitive processing., Water will aid with movement using the current and laminar flow while the buoyancy reduces weight bearing, and Hydrostatic pressure also supports joints by unweighting joint load by at least 50 % in 3-4 feet depth water. 80% in chest to neck deep water. Pt entered pool via stairs, depth up to 4', temp 94 deg.  Walking with arms to side for chest stretch  Backward walking- arms to side  for resisted scap retraction  Triceps press down & straight arm press down with dumbbells  Both hands on one dumbbell- hip hinge with press down  Triceps press ups from seated on bench   Added V sit, curl ins   Altered to press down on water bar for decreased stability  Manual tehrapy: STM to bil upper traps  11/1 AquaticREHABdocumentation: Pt is unable to tolerate land due to pain or inability to move freely on land without pain and substitution/compensations , Water will provide increased arousal using the property of surface tension as this patient struggles with lethargy which impairs the cognitive processing., Pt requires the buoyancy of water for active assisted exercises with buoyancy supported for strengthening & ROM exercises, and Hydrostatic pressure also supports joints by unweighting joint load by at  least 50 % in 3-4 feet depth water. 80% in chest to neck deep water.  Entered pool via stairs, depth up to 4', temp 94 deg Supine suspended mobilization of cervical region with STM to bil upper traps, cervical paraspinals and suboccipitals. Cervical distraction pulling through water.  Aqua stretch techniques to decrease muscle tension and tightness upper traps [place] Ragaz, Pt with lumbar belt around hips and nek doodle for neck support, ankle buoys and noodles .  Pt assisted into supine floating position by lying head on shoulder of PT to get into floating position. . PT at torso and assisting with trunk left to right and vice versa to engage trunk muscles. PT then rotated trunk in order to engage abdomnal (internal and external obliques). Side bending stretching -PPT x 10 verbal and TC for proper execution -Hip extension x 5 bilat -knee flexion x 5 bilat in above position -hip/knee flex with hold for LB stretch 3 x 30 second  10/25: AquaticREHABdocumentation: Pt is unable to tolerate land due to pain or inability to move freely on land without pain and substitution/compensations , Water will provide increased arousal using the property of surface tension as this patient struggles with lethargy which impairs the cognitive processing., Pt requires the buoyancy of water for active assisted exercises with buoyancy supported for strengthening & ROM exercises, and Hydrostatic pressure also supports joints by unweighting joint load by at least 50 % in 3-4 feet depth water. 80% in chest to neck deep water.   Entered pool via stairs, depth up to 4', temp 94 deg             Suspended mobilization from cervical region with STM to bil upper traps, cervical paraspinals and suboccipitals.  Standing trunk rotation Wall push ups Triceps press up from seated with scap retraction   Eval: Correct postural alignment Scap retraction  Upper trap & levator stretch Lumbar flexion stretch in seated  PATIENT  EDUCATION:  Education details: discussed MRI reports   Person educated: Patient Education method: Explanation Education comprehension: verbalized understanding     HOME EXERCISE PROGRAM: [id number]   ASSESSMENT:   CLINICAL IMPRESSION: Pt with improvement in lumbar pain following manual therapy and exercise. Pt with report of pain reduced to 3/10 following stretching and manual. Pt with decreased lumbar and thoracic mobility likely contributing to pain presentation. Plan to introduce core stabilization as able next session. Pt's pain appears to be improving gradually with each session. Pt would benefit from continued skilled therapy in order to reach goals and maximize functional postural strength and ROM for full return independent function.     REHAB POTENTIAL: Good   CLINICAL DECISION MAKING: Evolving/moderate complexity   EVALUATION  COMPLEXITY: Moderate     GOALS: Goals reviewed with patient? Yes   SHORT TERM GOALS:   STG Name Target Date Goal status  1 Pt will be able to demo proper resting postural alignment Baseline: still has to correct when she remembers but is able to correct 02/11/2021 achieved  2 Pt will be independent in daily stretching program  Baseline: achieved at this time 02/11/2021 achieved                                                 LONG TERM GOALS:    LTG Name Target Date Goal status  1 ODI to improve my MDC of 12 points Baseline: scored 17 points at eval 03/18/2021 INITIAL  2 NDI to improve by [place] of 10 points Baseline: scored 21 points at eval 03/18/2021 INITIAL  3 Pt will be able to bend from a standing position to reach objects on floor with minimal to no discomfort Baseline:very limited at eval 03/18/2021 INITIAL  4 Pt will be able to stand to wash dishes  Baseline: will work on recognition of necessary postural changes and breaks to improve endurance 03/18/2021 INITIAL  5 Pt will be independent in long term exercise to continue advancing  strength Baseline:will progress as appropriate 03/18/2021 INITIAL                      PLAN: PT FREQUENCY: 2x/week   PT DURATION: 8 weeks   PLANNED INTERVENTIONS: Therapeutic exercises, Therapeutic activity, Neuro Muscular re-education, Balance training, Gait training, Patient/Family education, Joint mobilization, Stair training, Aquatic Therapy, Dry Needling, Spinal mobilization, Cryotherapy, Moist heat, and Manual therapy   PLAN FOR NEXT SESSION: anterior chest flexibility, periscap activation; core engagement to decrease lumbar spasm, hip hinge/bending tolerance, TPDN   Daleen Bo PT, DPT 02/28/21 3:15 PM

## 2021-03-02 ENCOUNTER — Encounter (HOSPITAL_BASED_OUTPATIENT_CLINIC_OR_DEPARTMENT_OTHER): Payer: Self-pay | Admitting: Physical Therapy

## 2021-03-02 ENCOUNTER — Other Ambulatory Visit: Payer: Self-pay

## 2021-03-02 ENCOUNTER — Ambulatory Visit (HOSPITAL_BASED_OUTPATIENT_CLINIC_OR_DEPARTMENT_OTHER): Payer: 59 | Admitting: Physical Therapy

## 2021-03-02 DIAGNOSIS — M25561 Pain in right knee: Secondary | ICD-10-CM | POA: Diagnosis not present

## 2021-03-02 DIAGNOSIS — M6281 Muscle weakness (generalized): Secondary | ICD-10-CM

## 2021-03-02 DIAGNOSIS — R262 Difficulty in walking, not elsewhere classified: Secondary | ICD-10-CM

## 2021-03-02 DIAGNOSIS — M542 Cervicalgia: Secondary | ICD-10-CM

## 2021-03-02 DIAGNOSIS — M545 Low back pain, unspecified: Secondary | ICD-10-CM

## 2021-03-02 NOTE — Therapy (Signed)
OUTPATIENT PHYSICAL THERAPY TREATMENT NOTE   Patient Name: [name] MRN: [id number] DOB:[birth year], 50 y.o., female Today's Date: 03/02/2021  PCP: Hoyt Koch, MD REFERRING PROVIDER: Vanetta Mulders, MD   PT End of Session - 03/02/21 1410     Visit Number 9    Number of Visits 17    Date for PT Re-Evaluation 03/18/21    Authorization Type [employer]    PT Start Time 1350    PT Stop Time 1430    PT Time Calculation (min) 40 min    Activity Tolerance Patient tolerated treatment well    Behavior During Therapy [facility] for tasks assessed/performed                Past Medical History:  Diagnosis Date   Abdominal pain, epigastric 11/20/2019   Asthma    as a child   Cervical dysplasia    Gastroesophageal reflux disease 11/20/2019   Genetic testing 02/01/2018   Genetic testing was performed in 2015. The [employer] panel was ordered. In total, 23 genes were analyzed as part of this panel: ATM, BARD1, BRCA1, BRCA2, BRIP1, CDH1, CHEK2, EPCAM, MLH1, MRE11A, MSH2, MSH6, MUTYH, NBN, NF1, PALB2, PMS2, PTEN, RAD50, RAD51C, RAD51D, STK11, and TP53. No pathogenic variants were detected. Genetic testing did detect a Variant of Unknown Significance (VUS) at the t   GERD (gastroesophageal reflux disease)    had during chemo   Headache 06/12/2013   History of chemotherapy    History of kidney stones    History of radiation therapy 11/24/13- 01/08/14   reconstructed right breast/chest wall 4500 cGy 25 sessions, electron beam boost 1440 cGy 8 sessions   Liver masses 12/06/2016   Major depressive disorder, in remission 03/12/2017   Malignant neoplasm of lower-inner quadrant of right breast of female, estrogen receptor positive 2012   Pneumonia    PONV (postoperative nausea and vomiting)    Recurrent cancer of right breast 12/22/2013   Thrombocytopenia 05/22/2013   Past Surgical History:  Procedure Laterality Date   ADENOIDECTOMY     BREAST LUMPECTOMY WITH NEEDLE  LOCALIZATION Right 05/05/2013   Procedure: EXCISION RECURRENT CANCER RIGHT BREAST WITH NEEDLE LOCALOZATION;  Surgeon: Adin Hector, MD;  Location: [place];  Service: General;  Laterality: Right;   BREAST RECONSTRUCTION  06/29/2011   Procedure: BREAST RECONSTRUCTION;  Surgeon: Theodoro Kos, DO;  Location: [place];  Service: Plastics;  Laterality: Bilateral;  Immediate Bilateral Breast Reconstruction with Bilateral Tissue Expanders and placement of  Alloderm    DILATION AND CURETTAGE OF UTERUS     fibroid tumor surgery     lazy eye     corrective surgery   MASTECTOMY W/ SENTINEL NODE BIOPSY  06/29/2011   Procedure: MASTECTOMY WITH SENTINEL LYMPH NODE BIOPSY;  Surgeon: Adin Hector, MD;  Location: [place];  Service: General;  Laterality: Right;  right skin spairing mstectomy and right sentinel lymph node biopsy   PORT-A-CATH REMOVAL  03/12/2012   Procedure: MINOR REMOVAL PORT-A-CATH;  Surgeon: Adin Hector, MD;  Location: [place];  Service: General;  Laterality: N/A;  Upper left   PORTACATH PLACEMENT Left 05/05/2013   Procedure: INSERTION PORT-A-CATH;  Surgeon: Adin Hector, MD;  Location: [place];  Service: General;  Laterality: Left;   RHINOPLASTY     uterine cervical treatments  2002   for precancerous lesions   Patient Active Problem List   Diagnosis Date Noted   Back pain 02/26/2021   Neck pain 02/26/2021  Gastroesophageal reflux disease 11/20/2019   Major depressive disorder, in remission 03/12/2017   Liver masses 12/06/2016   Recurrent cancer of right breast 12/22/2013   Family history of malignant neoplasm of ovary 07/25/2013   Family history of malignant neoplasm of breast 07/25/2013   Family history of malignant neoplasm of gastrointestinal tract 07/25/2013   Headache 06/12/2013   Thrombocytopenia 05/22/2013   Acquired absence of bilateral breasts and nipples 11/23/2011   Malignant neoplasm of lower-inner quadrant of right breast of female, estrogen  receptor positive 2012    REFERRING DIAG: M54.50,G89.29 (ICD-10-CM) - Chronic low back pain, unspecified back pain laterality, unspecified whether sciatica present M54.2 (ICD-10-CM) - Cervical pain (neck)   THERAPY DIAG:  Difficulty in walking, not elsewhere classified  Cervicalgia  Muscle weakness (generalized)  Pain, lumbar region  PERTINENT HISTORY: h/o breast CA with reconstruction, fat removal from bil thighs, migraines  PRECAUTIONS: none   SUBJECTIVE: Pt states she was feeling achey this morning. She is feeling sore across her low back today as well. She started hurting last evening in her back after PT.  PAIN:  Are you having pain? Yes VAS scale: 5/10 4/10  Pain location: lumbar, neck Pain orientation: Bilateral  PAIN TYPE: aching and tension Pain description: intermittent  Aggravating factors: sitting unsupported, leaning fwd Relieving factors: lay down    OBJECTIVE:  DIAGNOSTIC FINDINGS:  Cervical & lumbar MRI 10/13   PATIENT SURVEYS:  ODI: 17 NDI: 21        TODAY'S TREATMENT:  11/23  L1-5 CPA and UPA bilat grade IV C/S LAD grade III, subocciptial hold C2-7 UPA bilat grade II STM: bilat L/S paraspinal and R lower T/S paraspinal  Bridge 2x10 Rowing 15x GTB Doorway pec stretch 30s 3x each Curl up 2x10   11/21  L1-5 CPA and UPA bilat grade IV C/S LAD grade II STM: bilat L/S paraspinal and R lower T/S paraspinal Suboccipital release  Child's pose 3s 10x Cat cow 10 each Open book 5s 10x  11/16  Chin tucks 1s 10x (VC and TC needed for acceptable range and technique)  YTB external rotation with scap retraction 2x10   TPDN with skilled palpation and monitoring (bil upper traps),  STM following, C2-5 CPA grade II-III  11/14  Cervical rotation SNAG  Active cervical motions/stretches with avoiding extension motion  TPDN with skilled palpation and monitoring (bil upper traps), STM following, traction, gross stretching, lateral mobilizations Rt  to Lt    11/10  AquaticREHABdocumentation: Water will provide increased arousal using the property of surface tension as this patient struggles with lethargy which impairs the cognitive processing., Water will aid with movement using the current and laminar flow while the buoyancy reduces weight bearing, and Hydrostatic pressure also supports joints by unweighting joint load by at least 50 % in 3-4 feet depth water. 80% in chest to neck deep water.  Entered pool via stairs without use of hand rail, depth up to 4', temp 95 deg.   Forward walking with abdominal engagement Manual STM to suboccipitals with mobile floating for stretch and joint mobility; also performed with bilateral QLs  Float planks with belt under pelvis  Knee/elbow planks on steps  11/4  AquaticREHABdocumentation: Water will provide increased arousal using the property of surface tension as this patient struggles with lethargy which impairs the cognitive processing., Water will aid with movement using the current and laminar flow while the buoyancy reduces weight bearing, and Hydrostatic pressure also supports joints by unweighting joint load by at least  50 % in 3-4 feet depth water. 80% in chest to neck deep water. Pt entered pool via stairs, depth up to 4', temp 94 deg.  Walking with arms to side for chest stretch  Backward walking- arms to side for resisted scap retraction  Triceps press down & straight arm press down with dumbbells  Both hands on one dumbbell- hip hinge with press down  Triceps press ups from seated on bench   Added V sit, curl ins   Altered to press down on water bar for decreased stability  Manual tehrapy: STM to bil upper traps  11/1 AquaticREHABdocumentation: Pt is unable to tolerate land due to pain or inability to move freely on land without pain and substitution/compensations , Water will provide increased arousal using the property of surface tension as this patient struggles with lethargy which  impairs the cognitive processing., Pt requires the buoyancy of water for active assisted exercises with buoyancy supported for strengthening & ROM exercises, and Hydrostatic pressure also supports joints by unweighting joint load by at least 50 % in 3-4 feet depth water. 80% in chest to neck deep water.  Entered pool via stairs, depth up to 4', temp 94 deg Supine suspended mobilization of cervical region with STM to bil upper traps, cervical paraspinals and suboccipitals. Cervical distraction pulling through water.  Aqua stretch techniques to decrease muscle tension and tightness upper traps [place]az, Pt with lumbar belt around hips and nek doodle for neck support, ankle buoys and noodles .  Pt assisted into supine floating position by lying head on shoulder of PT to get into floating position. . PT at torso and assisting with trunk left to right and vice versa to engage trunk muscles. PT then rotated trunk in order to engage abdomnal (internal and external obliques). Side bending stretching -PPT x 10 verbal and TC for proper execution -Hip extension x 5 bilat -knee flexion x 5 bilat in above position -hip/knee flex with hold for LB stretch 3 x 30 second  10/25: AquaticREHABdocumentation: Pt is unable to tolerate land due to pain or inability to move freely on land without pain and substitution/compensations , Water will provide increased arousal using the property of surface tension as this patient struggles with lethargy which impairs the cognitive processing., Pt requires the buoyancy of water for active assisted exercises with buoyancy supported for strengthening & ROM exercises, and Hydrostatic pressure also supports joints by unweighting joint load by at least 50 % in 3-4 feet depth water. 80% in chest to neck deep water.   Entered pool via stairs, depth up to 4', temp 94 deg             Suspended mobilization from cervical region with STM to bil upper traps, cervical paraspinals and  suboccipitals.  Standing trunk rotation Wall push ups Triceps press up from seated with scap retraction   Eval: Correct postural alignment Scap retraction  Upper trap & levator stretch Lumbar flexion stretch in seated  PATIENT EDUCATION:  Education details: discussed MRI reports   Person educated: Patient Education method: Explanation Education comprehension: verbalized understanding     HOME EXERCISE PROGRAM: Access Code: [email or web address] URL: [email or web address] Date: 03/02/2021 Prepared by: Daleen Bo  Exercises Seated Gentle Upper Trapezius Stretch - 2 x daily - 7 x weekly - 2 sets - 3 breaths hold Gentle Levator Scapulae Stretch - 2 x daily - 7 x weekly - 2 sets - 3 breaths hold Sidelying Open Book - 1 x daily - 7 x  weekly - 3 sets - 3 breaths hold Standing Shoulder Row with Anchored Resistance - 1 x daily - 7 x weekly - 2 sets - 10 reps Shoulder External Rotation and Scapular Retraction with Resistance - 1 x daily - 7 x weekly - 2 sets - 8 reps Neutral Lumbar Spine Curl Up - 1 x daily - 7 x weekly - 1 sets - 10 reps    ASSESSMENT:   CLINICAL IMPRESSION: Pt with improvement in lumbar pain following manual therapy and exercise. Pt able to tolerate gentle core stabilization and resisted scapular exercise without pain. Plan to continue with pain free resisted movement as able. Pt response to manual appears to be minimally improving with each session. Pt likely to benefit more from pain free ROM to facilitate reduction of perseveration/apprehension with movement. Hep condense for better compliance/carryover. Pt would benefit from continued skilled therapy in order to reach goals and maximize functional postural strength and ROM for full return independent function.     REHAB POTENTIAL: Good   CLINICAL DECISION MAKING: Evolving/moderate complexity   EVALUATION COMPLEXITY: Moderate     GOALS: Goals reviewed with patient? Yes   SHORT TERM GOALS:   STG Name  Target Date Goal status  1 Pt will be able to demo proper resting postural alignment Baseline: still has to correct when she remembers but is able to correct 02/11/2021 achieved  2 Pt will be independent in daily stretching program  Baseline: achieved at this time 02/11/2021 achieved                                                 LONG TERM GOALS:    LTG Name Target Date Goal status  1 ODI to improve my MDC of 12 points Baseline: scored 17 points at eval 03/18/2021 INITIAL  2 NDI to improve by [place] of 10 points Baseline: scored 21 points at eval 03/18/2021 INITIAL  3 Pt will be able to bend from a standing position to reach objects on floor with minimal to no discomfort Baseline:very limited at eval 03/18/2021 INITIAL  4 Pt will be able to stand to wash dishes  Baseline: will work on recognition of necessary postural changes and breaks to improve endurance 03/18/2021 INITIAL  5 Pt will be independent in long term exercise to continue advancing strength Baseline:will progress as appropriate 03/18/2021 INITIAL                      PLAN: PT FREQUENCY: 2x/week   PT DURATION: 8 weeks   PLANNED INTERVENTIONS: Therapeutic exercises, Therapeutic activity, Neuro Muscular re-education, Balance training, Gait training, Patient/Family education, Joint mobilization, Stair training, Aquatic Therapy, Dry Needling, Spinal mobilization, Cryotherapy, Moist heat, and Manual therapy   PLAN FOR NEXT SESSION: anterior chest flexibility, periscap activation; core engagement to decrease lumbar spasm, hip hinge/bending tolerance, TPDN   Daleen Bo PT, DPT 03/02/21 2:32 PM

## 2021-03-07 ENCOUNTER — Encounter: Payer: Self-pay | Admitting: Physical Medicine and Rehabilitation

## 2021-03-07 ENCOUNTER — Ambulatory Visit (INDEPENDENT_AMBULATORY_CARE_PROVIDER_SITE_OTHER): Payer: 59 | Admitting: Physical Medicine and Rehabilitation

## 2021-03-07 ENCOUNTER — Encounter (HOSPITAL_BASED_OUTPATIENT_CLINIC_OR_DEPARTMENT_OTHER): Payer: Self-pay | Admitting: Physical Therapy

## 2021-03-07 ENCOUNTER — Ambulatory Visit (HOSPITAL_BASED_OUTPATIENT_CLINIC_OR_DEPARTMENT_OTHER): Payer: 59 | Admitting: Physical Therapy

## 2021-03-07 ENCOUNTER — Ambulatory Visit (INDEPENDENT_AMBULATORY_CARE_PROVIDER_SITE_OTHER): Payer: 59 | Admitting: Sports Medicine

## 2021-03-07 ENCOUNTER — Other Ambulatory Visit: Payer: Self-pay

## 2021-03-07 VITALS — BP 110/72 | HR 77 | Ht 63.0 in | Wt 130.0 lb

## 2021-03-07 VITALS — BP 102/73 | HR 75

## 2021-03-07 DIAGNOSIS — M9908 Segmental and somatic dysfunction of rib cage: Secondary | ICD-10-CM

## 2021-03-07 DIAGNOSIS — F068 Other specified mental disorders due to known physiological condition: Secondary | ICD-10-CM

## 2021-03-07 DIAGNOSIS — M9901 Segmental and somatic dysfunction of cervical region: Secondary | ICD-10-CM

## 2021-03-07 DIAGNOSIS — M5442 Lumbago with sciatica, left side: Secondary | ICD-10-CM | POA: Diagnosis not present

## 2021-03-07 DIAGNOSIS — Z9289 Personal history of other medical treatment: Secondary | ICD-10-CM | POA: Diagnosis not present

## 2021-03-07 DIAGNOSIS — M5441 Lumbago with sciatica, right side: Secondary | ICD-10-CM

## 2021-03-07 DIAGNOSIS — M542 Cervicalgia: Secondary | ICD-10-CM

## 2021-03-07 DIAGNOSIS — M9905 Segmental and somatic dysfunction of pelvic region: Secondary | ICD-10-CM

## 2021-03-07 DIAGNOSIS — M5416 Radiculopathy, lumbar region: Secondary | ICD-10-CM

## 2021-03-07 DIAGNOSIS — M9903 Segmental and somatic dysfunction of lumbar region: Secondary | ICD-10-CM

## 2021-03-07 DIAGNOSIS — M9902 Segmental and somatic dysfunction of thoracic region: Secondary | ICD-10-CM | POA: Diagnosis not present

## 2021-03-07 DIAGNOSIS — D1809 Hemangioma of other sites: Secondary | ICD-10-CM | POA: Diagnosis not present

## 2021-03-07 DIAGNOSIS — G8929 Other chronic pain: Secondary | ICD-10-CM

## 2021-03-07 DIAGNOSIS — M25561 Pain in right knee: Secondary | ICD-10-CM | POA: Diagnosis not present

## 2021-03-07 NOTE — Progress Notes (Signed)
[name] Phone: [phone]   Assessment and Plan:      1. Chronic bilateral low back pain with bilateral sciatica 2. Somatic dysfunction of cervical region 3. Somatic dysfunction of thoracic region 4. Somatic dysfunction of lumbar region 5. Somatic dysfunction of pelvic region 6. Somatic dysfunction of rib region -Chronic with exacerbation, subsequent visit - Recurrence of multiple musculoskeletal complaints with most prominent being low back pain with radicular symptoms bilaterally and thoracic back pain without radiation that have worsened since Christmas decorating of the weekend - No red flag symptoms on physical exam.  No imaging performed today. - Patient has received significant relief with OMT in the past.  Elects for repeat OMT today.  Tolerated well per note below. - Decision today to treat with OMT was based on Physical Exam   After verbal consent patient was treated with HVLA (high velocity low amplitude), ME (muscle energy), FPR (flex positional release), ST (soft tissue), PC/PD (Pelvic Compression/ Pelvic Decompression) techniques in cervical, rib, thoracic, lumbar, and pelvic areas. Patient tolerated the procedure well with improvement in symptoms.  Patient educated on potential side effects of soreness and recommended to rest, hydrate, and use Tylenol as needed for pain control.   Pertinent previous records reviewed include none   Follow Up: 2 weeks for repeat OMT   Subjective:   I, [name], am serving as a scribe for Dr. Glennon Mac  Chief Complaint: neck and back pain   HPI:   02/04/21 Patient is a 50 year old female presenting with Neck pain and was referred to sports medicine for evaluation and treatment. Patient sees neurology for the headaches and neck pain, was last seen on 02/01/21 by Dr. Tomi Likens for possible OMT. Today patient states LBP started first. 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 Headaches mild to severe and constant and located in the back. Dull constant achy pressure in neck. PT just started this week. Pain down left arm numbness and tingling. LBP spreads over back. Dull achy and pressure. Numbness and tingling doing down both legs. Lipedema in both legs   02/10/21 Patient states whole back has been hurting over the past 2 days. Did start PT and feels like everything is moving in a positive direction. No new complaints.   02/21/21 Patient states that her neck and head are hurting and took ibuprofen and Excedrin to help. Patient also had PT and had dry needling today.   03/07/21 Patient states that her back is not doing well. Patient had decorated for Christmas putting up her tree so she was in a lot of weird positions and she started to get the low back pain that radiated down both legs. Patient states that the pain hurt so bad she took an old tramadol Rx she had. This issue started last night.   Relevant Historical Information:  History of breast cancer, ovarian cancer, spinal stenosis  Additional pertinent review of systems negative.  Current Outpatient Medications  Medication Sig Dispense Refill   aspirin-acetaminophen-caffeine (EXCEDRIN MIGRAINE) 250-250-65 MG tablet Take by mouth every 6 (six) hours as needed for headache.     Biotin 5000 MCG CAPS Take 5,000 mcg by mouth daily.     Black Pepper-Turmeric (TURMERIC CURCUMIN) 08-998 MG CAPS Take by mouth 2 (two) times daily.     celecoxib (CELEBREX) 200 MG capsule Take 1 capsule (200 mg total) by mouth 2 (  two) times daily. 30 capsule 3   Cholecalciferol (VITAMIN D3) 125 MCG (5000 UT) TBDP Take 1 tablet by mouth daily.     Coenzyme Q10 (COQ10) 150 MG CAPS Take 1 capsule by mouth once.     cyclobenzaprine (FLEXERIL) 10 MG tablet Take 1 tablet (10 mg total) by mouth every 8 (eight) hours as needed for muscle spasms. 30 tablet 2   finasteride  (PROSCAR) 5 MG tablet Take 5 mg by mouth daily. 1/2 tablet daily     ibuprofen (ADVIL) 800 MG tablet Take 1 tablet (800 mg total) by mouth every 8 (eight) hours as needed. 30 tablet 5   methocarbamol (ROBAXIN) 500 MG tablet Take 1 tablet (500 mg total) by mouth 3 (three) times daily. 90 tablet 0   pantoprazole (PROTONIX) 40 MG tablet Take 1 tablet (40 mg total) by mouth daily. 30 tablet 11   Prenat-Fe Poly-Methfol-FA-DHA (VITAFOL FE+) 90-0.6-0.4-200 MG CAPS Take 1 tablet by mouth daily. 30 capsule 11   sucralfate (CARAFATE) 1 g tablet Take 1 tablet (1 g total) by mouth 4 (four) times daily -  with meals and at bedtime. 120 tablet 1   Ubrogepant (UBRELVY) 100 MG TABS Take 1 tablet by mouth as needed (May repeat in 2 hours.  maximum 2 tablets in 24 hours.). 16 tablet 5   No current facility-administered medications for this visit.      Objective:     Vitals:   03/07/21 1125  BP: 110/72  Pulse: 77  SpO2: 98%  Weight: 130 lb (59 kg)  Height: 5\' 3"  (1.6 m)      Body mass index is 23.03 kg/m.    Physical Exam:     General: Well-appearing, cooperative, sitting comfortably in no acute distress.   OMT Physical Exam:  ASIS Compression Test: Positive Right Cervical: TTP paraspinal, C3 RRSL, C6 RRSL Rib: Bilateral elevated first rib with TTP Thoracic: TTP paraspinal, T3-7 RRSL Lumbar: TTP paraspinal, L1-3 RRSL Pelvis: Right anterior innominate  Electronically signed by:  Judith Mccreedy D.[facility] 11:48 AM 03/07/21

## 2021-03-07 NOTE — Therapy (Signed)
OUTPATIENT PHYSICAL THERAPY TREATMENT NOTE   Patient Name: [name] MRN: [id number] DOB:1970-08-05, 50 y.o., female Today's Date: 03/07/2021  PCP: Hoyt Koch, MD REFERRING PROVIDER: Vanetta Mulders, MD   PT End of Session - 03/07/21 1540     Visit Number 10    Number of Visits 17    Date for PT Re-Evaluation 03/18/21    Authorization Type [employer]    PT Start Time 9678    PT Stop Time 1428    PT Time Calculation (min) 43 min    Activity Tolerance Patient tolerated treatment well    Behavior During Therapy [facility] for tasks assessed/performed             Past Medical History:  Diagnosis Date   Abdominal pain, epigastric 11/20/2019   Asthma    as a child   Cervical dysplasia    Gastroesophageal reflux disease 11/20/2019   Genetic testing 02/01/2018   Genetic testing was performed in 2015. The [employer] OvaNext panel was ordered. In total, 23 genes were analyzed as part of this panel: ATM, BARD1, BRCA1, BRCA2, BRIP1, CDH1, CHEK2, EPCAM, MLH1, MRE11A, MSH2, MSH6, MUTYH, NBN, NF1, PALB2, PMS2, PTEN, RAD50, RAD51C, RAD51D, STK11, and TP53. No pathogenic variants were detected. Genetic testing did detect a Variant of Unknown Significance (VUS) at the t   GERD (gastroesophageal reflux disease)    had during chemo   Headache 06/12/2013   History of chemotherapy    History of kidney stones    History of radiation therapy 11/24/13- 01/08/14   reconstructed right breast/chest wall 4500 cGy 25 sessions, electron beam boost 1440 cGy 8 sessions   Liver masses 12/06/2016   Major depressive disorder, in remission 03/12/2017   Malignant neoplasm of lower-inner quadrant of right breast of female, estrogen receptor positive 2012   Pneumonia    PONV (postoperative nausea and vomiting)    Recurrent cancer of right breast 12/22/2013   Thrombocytopenia 05/22/2013   Past Surgical History:  Procedure Laterality Date   ADENOIDECTOMY     BREAST LUMPECTOMY WITH NEEDLE LOCALIZATION  Right 05/05/2013   Procedure: EXCISION RECURRENT CANCER RIGHT BREAST WITH NEEDLE LOCALOZATION;  Surgeon: Adin Hector, MD;  Location: [place];  Service: General;  Laterality: Right;   BREAST RECONSTRUCTION  06/29/2011   Procedure: BREAST RECONSTRUCTION;  Surgeon: Theodoro Kos, DO;  Location: [place];  Service: Plastics;  Laterality: Bilateral;  Immediate Bilateral Breast Reconstruction with Bilateral Tissue Expanders and placement of  Alloderm    DILATION AND CURETTAGE OF UTERUS     fibroid tumor surgery     lazy eye     corrective surgery   MASTECTOMY W/ SENTINEL NODE BIOPSY  06/29/2011   Procedure: MASTECTOMY WITH SENTINEL LYMPH NODE BIOPSY;  Surgeon: Adin Hector, MD;  Location: [place];  Service: General;  Laterality: Right;  right skin spairing mstectomy and right sentinel lymph node biopsy   PORT-A-CATH REMOVAL  03/12/2012   Procedure: MINOR REMOVAL PORT-A-CATH;  Surgeon: Adin Hector, MD;  Location: [place];  Service: General;  Laterality: N/A;  Upper left   PORTACATH PLACEMENT Left 05/05/2013   Procedure: INSERTION PORT-A-CATH;  Surgeon: Adin Hector, MD;  Location: [place];  Service: General;  Laterality: Left;   RHINOPLASTY     uterine cervical treatments  2002   for precancerous lesions   Patient Active Problem List   Diagnosis Date Noted   Back pain 02/26/2021   Neck pain 02/26/2021   Gastroesophageal reflux  disease 11/20/2019   Major depressive disorder, in remission 03/12/2017   Liver masses 12/06/2016   Recurrent cancer of right breast 12/22/2013   Family history of malignant neoplasm of ovary 07/25/2013   Family history of malignant neoplasm of breast 07/25/2013   Family history of malignant neoplasm of gastrointestinal tract 07/25/2013   Headache 06/12/2013   Thrombocytopenia 05/22/2013   Acquired absence of bilateral breasts and nipples 11/23/2011   Malignant neoplasm of lower-inner quadrant of right breast of female, estrogen receptor positive  2012     REFERRING DIAG: M54.50,G89.29 (ICD-10-CM) - Chronic low back pain, unspecified back pain laterality, unspecified whether sciatica present M54.2 (ICD-10-CM) - Cervical pain (neck)    THERAPY DIAG:  Difficulty in walking, not elsewhere classified   Cervicalgia   Muscle weakness (generalized)   Pain, lumbar region   PERTINENT HISTORY: h/o breast CA with reconstruction, fat removal from bil thighs, migraines   PRECAUTIONS: none     SUBJECTIVE: Patient reports over the weekend she put up her christmas tree and did a lot of decorating. She had significant pain yesterday and into today. She had a manipulation of her lumbar spine which she feels like helped. Her neck is tight as well, but better then the back.  PAIN:  Are you having pain? Yes VAS scale: 5/10 4/10  Pain location: lumbar, neck Pain orientation: Bilateral  PAIN TYPE: aching and tension Pain description: intermittent  Aggravating factors: sitting unsupported, leaning fwd Relieving factors: lay down       OBJECTIVE:  DIAGNOSTIC FINDINGS:  Cervical & lumbar MRI 10/13   PATIENT SURVEYS:  ODI: 17 NDI: 21         TODAY'S TREATMENT: 11/28  TPDN: Consent given; TPDN to L4/L5 paraspinals area bilateral and bilateral gluteals; good twitch response. Reviewed  post needle soreness and benefits of TPDN nof the back:   There-ex; given based on decreasing any post needle soreness.  Stretch: glute/ piriformis stretch: pull then push Reviewed use of tennis ball and trigger point hook for gluteals and lumbar spine  Supine Clam shell 3x10 red   Manual therapy: trigger point release to lumbar spine and gluteal       11/23   L1-5 CPA and UPA bilat grade IV C/S LAD grade III, subocciptial hold C2-7 UPA bilat grade II STM: bilat L/S paraspinal and R lower T/S paraspinal   Bridge 2x10 Rowing 15x GTB Doorway pec stretch 30s 3x each Curl up 2x10     11/21             L1-5 CPA and UPA bilat grade IV C/S  LAD grade II STM: bilat L/S paraspinal and R lower T/S paraspinal Suboccipital release  Child's pose 3s 10x Cat cow 10 each Open book 5s 10x   11/16             Chin tucks 1s 10x (VC and TC needed for acceptable range and technique)             YTB external rotation with scap retraction 2x10               TPDN with skilled palpation and monitoring (bil upper traps),  STM following, C2-5 CPA grade II-III   11/14             Cervical rotation SNAG             Active cervical motions/stretches with avoiding extension motion  TPDN with skilled palpation and monitoring (bil upper traps), STM following, traction, gross stretching, lateral mobilizations Rt to Lt                11/10             AquaticREHABdocumentation: Water will provide increased arousal using the property of surface tension as this patient struggles with lethargy which impairs the cognitive processing., Water will aid with movement using the current and laminar flow while the buoyancy reduces weight bearing, and Hydrostatic pressure also supports joints by unweighting joint load by at least 50 % in 3-4 feet depth water. 80% in chest to neck deep water.             Entered pool via stairs without use of hand rail, depth up to 4', temp 95 deg.              Forward walking with abdominal engagement Manual STM to suboccipitals with mobile floating for stretch and joint mobility; also performed with bilateral QLs             Float planks with belt under pelvis             Knee/elbow planks on steps   11/4             AquaticREHABdocumentation: Water will provide increased arousal using the property of surface tension as this patient struggles with lethargy which impairs the cognitive processing., Water will aid with movement using the current and laminar flow while the buoyancy reduces weight bearing, and Hydrostatic pressure also supports joints by unweighting joint load by at least 50 % in 3-4 feet depth water. 80%  in chest to neck deep water. Pt entered pool via stairs, depth up to 4', temp 94 deg.  Walking with arms to side for chest stretch             Backward walking- arms to side for resisted scap retraction             Triceps press down & straight arm press down with dumbbells             Both hands on one dumbbell- hip hinge with press down             Triceps press ups from seated on bench                         Added V sit, curl ins                         Altered to press down on water bar for decreased stability             Manual tehrapy: STM to bil upper traps   1      Eval: Correct postural alignment Scap retraction  Upper trap & levator stretch Lumbar flexion stretch in seated   PATIENT EDUCATION:  Education details: discussed MRI reports   Person educated: Patient Education method: Explanation Education comprehension: verbalized understanding     HOME EXERCISE PROGRAM: Access Code: [email or web address] URL: [email or web address] Date: 03/02/2021 Prepared by: Daleen Bo   Exercises Seated Gentle Upper Trapezius Stretch - 2 x daily - 7 x weekly - 2 sets - 3 breaths hold Gentle Levator Scapulae Stretch - 2 x daily - 7 x weekly - 2 sets - 3 breaths hold Sidelying Open Book - 1 x daily - 7 x weekly -  3 sets - 3 breaths hold Standing Shoulder Row with Anchored Resistance - 1 x daily - 7 x weekly - 2 sets - 10 reps Shoulder External Rotation and Scapular Retraction with Resistance - 1 x daily - 7 x weekly - 2 sets - 8 reps Neutral Lumbar Spine Curl Up - 1 x daily - 7 x weekly - 1 sets - 10 reps     ASSESSMENT:   CLINICAL IMPRESSION: Patient had trigger points in her gluteal today and lower lumbar spine. She had a great twitch response with all needles. She was shown how to perfrom self soft tissue mobilization to reduce post needle soreness. Therapy reviewed her HEP with her and educated her on how to use stretches vs exercises. She was also given a clam shell with TA  breathing for her HEP.    REHAB POTENTIAL: Good   CLINICAL DECISION MAKING: Evolving/moderate complexity   EVALUATION COMPLEXITY: Moderate     GOALS: Goals reviewed with patient? Yes   SHORT TERM GOALS:   STG Name Target Date Goal status  1 Pt will be able to demo proper resting postural alignment Baseline: still has to correct when she remembers but is able to correct 02/11/2021 achieved  2 Pt will be independent in daily stretching program  Baseline: achieved at this time 02/11/2021 achieved                                                 LONG TERM GOALS:    LTG Name Target Date Goal status  1 ODI to improve my MDC of 12 points Baseline: scored 17 points at eval 03/18/2021 INITIAL  2 NDI to improve by [place] of 10 points Baseline: scored 21 points at eval 03/18/2021 INITIAL  3 Pt will be able to bend from a standing position to reach objects on floor with minimal to no discomfort Baseline:very limited at eval 03/18/2021 INITIAL  4 Pt will be able to stand to wash dishes  Baseline: will work on recognition of necessary postural changes and breaks to improve endurance 03/18/2021 INITIAL  5 Pt will be independent in long term exercise to continue advancing strength Baseline:will progress as appropriate 03/18/2021 INITIAL                      PLAN: PT FREQUENCY: 2x/week   PT DURATION: 8 weeks   PLANNED INTERVENTIONS: Therapeutic exercises, Therapeutic activity, Neuro Muscular re-education, Balance training, Gait training, Patient/Family education, Joint mobilization, Stair training, Aquatic Therapy, Dry Needling, Spinal mobilization, Cryotherapy, Moist heat, and Manual therapy   PLAN FOR NEXT SESSION: anterior chest flexibility, periscap activation; core engagement to decrease lumbar spasm, hip hinge/bending tolerance, TPDN   Carney Living PT DPT  03/07/2021, 3:44 PM

## 2021-03-07 NOTE — Progress Notes (Signed)
[name] - 50 y.o. female MRN [id number]  Date of birth: 11/13/1970  Office Visit Note: Visit Date: 03/07/2021 PCP: Hoyt Koch, MD Referred by: Hoyt Koch, *  Subjective: Chief Complaint  Patient presents with   Lower Back - Pain   HPI: [name] is a 50 y.o. female who comes in today for evaluation of multiple issues including chronic, worsening and severe bilateral lower back pain that radiates to both legs and anxiety related to previous lumbar MRI findings, specifically hemangioma.  Patient continues to have severe bilateral lower back pain that is radiating to both of her legs. Patient continues to attend formal physical therapy at [facility] [place], but reports these treatments are not helpful in reducing her pain.  Patient states her pain is exacerbated by movement and activity and is somewhat relieved with rest. Patient was recently seen by Dr. Sherlyn Lees at [facility] for evaluation of chronic pain syndrome and is scheduled to follow-up soon for her second visit. Patient also reports her insurance rejected a type of injection that the pain management center wanted to try. Patient states she recently had a very bad day where her pain was very severe and did take a Tramadol tablet she had left over from previous surgery. Patient's recent lumbar MRI exhibits multilevel facet hypertrophy and a broad-based disc protrusion on the right at L4-L5. There is also a 2.2 cm hemangioma noted on the left at L4. No high-grade spinal canal stenosis noted. Patient is scheduled to return to our office on 03/14/21 for planned bilateral L4 transforaminal epidural steroid injection under fluoroscopic guidance. Patient states she does have some concerns regarding her recent lumbar MRI and is worried about the finding of the hemangioma. Patient denies focal weakness, numbness and tingling. Patient denies recent trauma or falls.   Review of Systems   Musculoskeletal:  Positive for back pain and myalgias.  Neurological:  Negative for tingling, sensory change, focal weakness and weakness.  All other systems reviewed and are negative. Otherwise per HPI.  Assessment & Plan: Visit Diagnoses:    ICD-10-CM   1. Lumbar radiculopathy  M54.16     2. Hemangioma of other sites  D18.09     3. History of MRI of lumbar spine  Z92.89     4. 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  Patient continues to have excruciating and debilitating pain despite good conservative therapies such as formal physical therapy, home exercise regimen and use of medications.  Patient is currently being seen by [facility] for chronic pain management.  Patient is scheduled for planned bilateral L4 transforaminal epidural steroid injection under fluoroscopic guidance on 03/14/2021.  I did discuss patient's previous lumbar MRI with her today in detail using images and spine model.  This conversation did include educational information regarding hemangiomas and I did inform her that this finding on her lumbar MRI is common and benign and not the cause of her pain.  I also talked with her about specifics regarding lumbar epidural steroid injection and informed her to take pre-procedure Valium that I previously prescribed on the morning of her injection.  Patient verbalizes severe pain and is instructed to follow-up with the pain management specialist at [facility] for more complete and multimodal pain management which we do not offer.  Patient verbalizes understanding  of our conversation today and states that she will follow-up with pain management specialist to discuss treatment options.  She really needs to have them take over control of her from an interventional standpoint and medication etc.  Patient encouraged to continue formal physical therapy  as tolerated.  No red flag symptoms noted upon exam today.   Meds & Orders: No orders of the defined types were placed in this encounter.  No orders of the defined types were placed in this encounter.   Follow-up: Return for Bilateral L4 transforaminal epoidural steroid injection (03/14/2021).   Procedures: No procedures performed      Clinical History: EXAM: MRI CERVICAL SPINE WITHOUT CONTRAST   TECHNIQUE: Multiplanar, multisequence MR imaging of the cervical spine was performed. No intravenous contrast was administered.   COMPARISON:  Cervical spine radiographs 12/27/2020   FINDINGS: Alignment: Slight retrolisthesis is again noted at C6-7. No other significant listhesis is present. Straightening of the normal cervical lordosis is present.   Vertebrae: Mild endplate changes are noted at C6-7. Marrow signal and vertebral body heights are otherwise normal.   Cord: Vocal cords are midline and symmetric. Trachea is clear.   Posterior Fossa, vertebral arteries, paraspinal tissues: Craniocervical junction is normal. Flow is present in the vertebral arteries bilaterally. Visualized intracranial contents are normal.   Disc levels:   C2-3: Negative.   C3-4: Mild disc bulging and left-sided uncovertebral spurring is present without significant stenosis.   C4-5: Minimal disc bulging is present. No significant stenosis is present.   C5-6: Mild disc bulging is present. Minimal uncovertebral spurring is noted on the left. No significant stenosis is present.   C6-7: A leftward disc osteophyte complex is present. Uncovertebral spurring and disc protrusion result in severe left foraminal stenosis. Partial effacement of the ventral CSF is noted. Mild right foraminal narrowing is evident.   C7-T1: Negative.   IMPRESSION: 1. Severe left foraminal stenosis at C6-7 secondary to a leftward disc osteophyte complex and uncovertebral spurring. This likely impacts the left C7 nerve  roots. 2. Mild right foraminal narrowing at C6-7. 3. Mild disc bulging and left-sided uncovertebral spurring at C3-4 and C4-5 without significant stenosis.     Electronically Signed   By: San Morelle M.D.   On: 01/23/2021 07:31   She reports that she quit smoking about 24 years ago. Her smoking use included cigarettes. She has never used smokeless tobacco. No results for input(s): HGBA1C, LABURIC in the last 8760 hours.  Objective:  VS:  HT:    WT:   BMI:     BP:102/73  HR:75bpm  TEMP: ( )  RESP:  Physical Exam Vitals and nursing note reviewed.  HENT:     Head: Normocephalic and atraumatic.     Right Ear: External ear normal.     Left Ear: External ear normal.     Nose: Nose normal.     Mouth/Throat:     Mouth: Mucous membranes are moist.  Eyes:     Extraocular Movements: Extraocular movements intact.  Cardiovascular:     Rate and Rhythm: Normal rate.     Pulses: Normal pulses.  Pulmonary:     Effort: Pulmonary effort is normal.  Abdominal:     General: Abdomen is flat. There is no distension.  Musculoskeletal:        General: Tenderness present.     Cervical back: Normal range of motion.     Comments: Pt rises from seated position to standing without difficulty. Good lumbar range of motion.  Strong distal strength without clonus, no pain upon palpation of greater trochanters. Sensation intact bilaterally. Dysesthesias noted to bilateral L5 dermatome. Walks independently, gait steady.       Skin:    General: Skin is warm and dry.     Capillary Refill: Capillary refill takes less than 2 seconds.  Neurological:     General: No focal deficit present.     Mental Status: She is alert and oriented to person, place, and time.  Psychiatric:        Mood and Affect: Mood normal.    Ortho Exam  Imaging: No results found.  Past Medical/Family/Surgical/Social History: Medications & Allergies reviewed per EMR, new medications updated. Patient Active Problem List    Diagnosis Date Noted   Back pain 02/26/2021   Neck pain 02/26/2021   Gastroesophageal reflux disease 11/20/2019   Major depressive disorder, in remission 03/12/2017   Liver masses 12/06/2016   Recurrent cancer of right breast 12/22/2013   Family history of malignant neoplasm of ovary 07/25/2013   Family history of malignant neoplasm of breast 07/25/2013   Family history of malignant neoplasm of gastrointestinal tract 07/25/2013   Headache 06/12/2013   Thrombocytopenia 05/22/2013   Acquired absence of bilateral breasts and nipples 11/23/2011   Malignant neoplasm of lower-inner quadrant of right breast of female, estrogen receptor positive 2012   Past Medical History:  Diagnosis Date   Abdominal pain, epigastric 11/20/2019   Asthma    as a child   Cervical dysplasia    Gastroesophageal reflux disease 11/20/2019   Genetic testing 02/01/2018   Genetic testing was performed in 2015. The [employer] Ova[employer] panel was ordered. In total, 23 genes were analyzed as part of this panel: ATM, BARD1, BRCA1, BRCA2, BRIP1, CDH1, CHEK2, EPCAM, MLH1, MRE11A, MSH2, MSH6, MUTYH, NBN, NF1, PALB2, PMS2, PTEN, RAD50, RAD51C, RAD51D, STK11, and TP53. No pathogenic variants were detected. Genetic testing did detect a Variant of Unknown Significance (VUS) at the t   GERD (gastroesophageal reflux disease)    had during chemo   Headache 06/12/2013   History of chemotherapy    History of kidney stones    History of radiation therapy 11/24/13- 01/08/14   reconstructed right breast/chest wall 4500 cGy 25 sessions, electron beam boost 1440 cGy 8 sessions   Liver masses 12/06/2016   Major depressive disorder, in remission 03/12/2017   Malignant neoplasm of lower-inner quadrant of right breast of female, estrogen receptor positive 2012   Pneumonia    PONV (postoperative nausea and vomiting)    Recurrent cancer of right breast 12/22/2013   Thrombocytopenia 05/22/2013   Family History  Adopted: Yes  Problem Relation Age of  Onset   Heart attack Father    Cancer Cousin 63       melanoma; mat first cousin, located on neck   Colon cancer Mother 36   Rectal cancer Mother    Cancer Maternal Aunt 60       stomach and ovarian as separate primaries   Cancer Maternal Uncle 65       lung; smoker   Cancer Maternal Uncle 63       prostate   Cancer Maternal Grandmother 75       enodometrial   Cancer Maternal Aunt 55       breast   Stomach cancer Maternal Aunt    Past Surgical History:  Procedure Laterality Date   ADENOIDECTOMY     BREAST LUMPECTOMY WITH NEEDLE LOCALIZATION Right 05/05/2013   Procedure: EXCISION RECURRENT CANCER  RIGHT BREAST WITH NEEDLE LOCALOZATION;  Surgeon: Adin Hector, MD;  Location: [place];  Service: General;  Laterality: Right;   BREAST RECONSTRUCTION  06/29/2011   Procedure: BREAST RECONSTRUCTION;  Surgeon: Theodoro Kos, DO;  Location: [place];  Service: Plastics;  Laterality: Bilateral;  Immediate Bilateral Breast Reconstruction with Bilateral Tissue Expanders and placement of  Alloderm    DILATION AND CURETTAGE OF UTERUS     fibroid tumor surgery     lazy eye     corrective surgery   MASTECTOMY W/ SENTINEL NODE BIOPSY  06/29/2011   Procedure: MASTECTOMY WITH SENTINEL LYMPH NODE BIOPSY;  Surgeon: Adin Hector, MD;  Location: [place];  Service: General;  Laterality: Right;  right skin spairing mstectomy and right sentinel lymph node biopsy   PORT-A-CATH REMOVAL  03/12/2012   Procedure: MINOR REMOVAL PORT-A-CATH;  Surgeon: Adin Hector, MD;  Location: [place];  Service: General;  Laterality: N/A;  Upper left   PORTACATH PLACEMENT Left 05/05/2013   Procedure: INSERTION PORT-A-CATH;  Surgeon: Adin Hector, MD;  Location: [place];  Service: General;  Laterality: Left;   RHINOPLASTY     uterine cervical treatments  2002   for precancerous lesions   Social History   Occupational History   Not on file  Tobacco Use   Smoking status: Former    Types: Cigarettes     Quit date: 07/20/1996    Years since quitting: 24.6   Smokeless tobacco: Never  Vaping Use   Vaping Use: Not on file  Substance and Sexual Activity   Alcohol use: Not Currently    Alcohol/week: 0.0 standard drinks   Drug use: No   Sexual activity: Yes    Partners: Male

## 2021-03-07 NOTE — Patient Instructions (Signed)
Good to see you  Exercises given  Keep follow up appointment

## 2021-03-07 NOTE — Progress Notes (Signed)
Pt state lower back pain. 

## 2021-03-09 ENCOUNTER — Ambulatory Visit (HOSPITAL_BASED_OUTPATIENT_CLINIC_OR_DEPARTMENT_OTHER): Payer: 59 | Admitting: Physical Therapy

## 2021-03-09 ENCOUNTER — Encounter (HOSPITAL_BASED_OUTPATIENT_CLINIC_OR_DEPARTMENT_OTHER): Payer: Self-pay | Admitting: Physical Therapy

## 2021-03-09 ENCOUNTER — Encounter: Payer: Self-pay | Admitting: Internal Medicine

## 2021-03-09 ENCOUNTER — Ambulatory Visit (INDEPENDENT_AMBULATORY_CARE_PROVIDER_SITE_OTHER): Payer: 59 | Admitting: Internal Medicine

## 2021-03-09 ENCOUNTER — Other Ambulatory Visit: Payer: Self-pay

## 2021-03-09 VITALS — BP 124/70 | HR 74 | Resp 18 | Ht 63.0 in

## 2021-03-09 DIAGNOSIS — R262 Difficulty in walking, not elsewhere classified: Secondary | ICD-10-CM

## 2021-03-09 DIAGNOSIS — M549 Dorsalgia, unspecified: Secondary | ICD-10-CM | POA: Diagnosis not present

## 2021-03-09 DIAGNOSIS — M542 Cervicalgia: Secondary | ICD-10-CM

## 2021-03-09 DIAGNOSIS — M2550 Pain in unspecified joint: Secondary | ICD-10-CM | POA: Diagnosis not present

## 2021-03-09 DIAGNOSIS — G8929 Other chronic pain: Secondary | ICD-10-CM | POA: Diagnosis not present

## 2021-03-09 DIAGNOSIS — M6281 Muscle weakness (generalized): Secondary | ICD-10-CM

## 2021-03-09 DIAGNOSIS — M545 Low back pain, unspecified: Secondary | ICD-10-CM

## 2021-03-09 DIAGNOSIS — M25561 Pain in right knee: Secondary | ICD-10-CM | POA: Diagnosis not present

## 2021-03-09 MED ORDER — HYDROCODONE-ACETAMINOPHEN 5-325 MG PO TABS: 1.0000 | ORAL_TABLET | Freq: Every day | ORAL | 0 refills | Status: DC | PRN

## 2021-03-09 MED ORDER — LIDOCAINE 5 % EX PTCH: 1.0000 | MEDICATED_PATCH | CUTANEOUS | 0 refills | Status: DC

## 2021-03-09 NOTE — Therapy (Signed)
OUTPATIENT PHYSICAL THERAPY PROGRESS NOTE   Patient Name: [name] MRN: [id number] DOB:1970/08/27, 50 y.o., female Today's Date: 03/09/2021  PCP: Hoyt Koch, MD REFERRING PROVIDER: Vanetta Mulders, MD   PT End of Session - 03/09/21 1346     Visit Number 11    Number of Visits 17    Date for PT Re-Evaluation 03/18/21    Authorization Type [employer]    PT Start Time 4268    PT Stop Time 1425    PT Time Calculation (min) 39 min    Activity Tolerance Patient tolerated treatment well    Behavior During Therapy [facility] for tasks assessed/performed              Past Medical History:  Diagnosis Date   Abdominal pain, epigastric 11/20/2019   Asthma    as a child   Cervical dysplasia    Gastroesophageal reflux disease 11/20/2019   Genetic testing 02/01/2018   Genetic testing was performed in 2015. The [employer] panel was ordered. In total, 23 genes were analyzed as part of this panel: ATM, BARD1, BRCA1, BRCA2, BRIP1, CDH1, CHEK2, EPCAM, MLH1, MRE11A, MSH2, MSH6, MUTYH, NBN, NF1, PALB2, PMS2, PTEN, RAD50, RAD51C, RAD51D, STK11, and TP53. No pathogenic variants were detected. Genetic testing did detect a Variant of Unknown Significance (VUS) at the t   GERD (gastroesophageal reflux disease)    had during chemo   Headache 06/12/2013   History of chemotherapy    History of kidney stones    History of radiation therapy 11/24/13- 01/08/14   reconstructed right breast/chest wall 4500 cGy 25 sessions, electron beam boost 1440 cGy 8 sessions   Liver masses 12/06/2016   Major depressive disorder, in remission 03/12/2017   Malignant neoplasm of lower-inner quadrant of right breast of female, estrogen receptor positive 2012   Pneumonia    PONV (postoperative nausea and vomiting)    Recurrent cancer of right breast 12/22/2013   Thrombocytopenia 05/22/2013   Past Surgical History:  Procedure Laterality Date   ADENOIDECTOMY     BREAST LUMPECTOMY WITH NEEDLE LOCALIZATION  Right 05/05/2013   Procedure: EXCISION RECURRENT CANCER RIGHT BREAST WITH NEEDLE LOCALOZATION;  Surgeon: Adin Hector, MD;  Location: [place];  Service: General;  Laterality: Right;   BREAST RECONSTRUCTION  06/29/2011   Procedure: BREAST RECONSTRUCTION;  Surgeon: Theodoro Kos, DO;  Location: [place];  Service: Plastics;  Laterality: Bilateral;  Immediate Bilateral Breast Reconstruction with Bilateral Tissue Expanders and placement of  Alloderm    DILATION AND CURETTAGE OF UTERUS     fibroid tumor surgery     lazy eye     corrective surgery   MASTECTOMY W/ SENTINEL NODE BIOPSY  06/29/2011   Procedure: MASTECTOMY WITH SENTINEL LYMPH NODE BIOPSY;  Surgeon: Adin Hector, MD;  Location: [place];  Service: General;  Laterality: Right;  right skin spairing mstectomy and right sentinel lymph node biopsy   PORT-A-CATH REMOVAL  03/12/2012   Procedure: MINOR REMOVAL PORT-A-CATH;  Surgeon: Adin Hector, MD;  Location: [place];  Service: General;  Laterality: N/A;  Upper left   PORTACATH PLACEMENT Left 05/05/2013   Procedure: INSERTION PORT-A-CATH;  Surgeon: Adin Hector, MD;  Location: [place];  Service: General;  Laterality: Left;   RHINOPLASTY     uterine cervical treatments  2002   for precancerous lesions   Patient Active Problem List   Diagnosis Date Noted   Back pain 02/26/2021   Neck pain 02/26/2021   Gastroesophageal  reflux disease 11/20/2019   Major depressive disorder, in remission 03/12/2017   Liver masses 12/06/2016   Recurrent cancer of right breast 12/22/2013   Family history of malignant neoplasm of ovary 07/25/2013   Family history of malignant neoplasm of breast 07/25/2013   Family history of malignant neoplasm of gastrointestinal tract 07/25/2013   Headache 06/12/2013   Thrombocytopenia 05/22/2013   Acquired absence of bilateral breasts and nipples 11/23/2011   Malignant neoplasm of lower-inner quadrant of right breast of female, estrogen receptor positive  2012     REFERRING DIAG: M54.50,G89.29 (ICD-10-CM) - Chronic low back pain, unspecified back pain laterality, unspecified whether sciatica present M54.2 (ICD-10-CM) - Cervical pain (neck)    THERAPY DIAG:  Difficulty in walking, not elsewhere classified   Cervicalgia   Muscle weakness (generalized)   Pain, lumbar region   PERTINENT HISTORY: h/o breast CA with reconstruction, fat removal from bil thighs, migraines   PRECAUTIONS: none     SUBJECTIVE: Pt states she felt better after last session but had more back pain yesterday. She tried to do "more things" around the home but had increased pain. Pt states she had more thoracic pain today.   PAIN:  Are you having pain? Yes VAS scale: 5/10 4/10  Pain location: lumbar, neck Pain orientation: Bilateral  PAIN TYPE: aching and tension Pain description: intermittent  Aggravating factors: sitting unsupported, leaning fwd Relieving factors: lay down       OBJECTIVE:  DIAGNOSTIC FINDINGS:  Cervical & lumbar MRI 10/13   PATIENT SURVEYS:  Oswestry Score: 22 / 50 or 44 % Neck Disability Index score: 20 / 50 = 40.0 %  Lumbar ROM: flex 75% with p! ext 50% p! L rot  75% R rot 75% L SB 50% R SB 50%  C/S ROM: globally WFL throughout ROM due to stiffness and pain, especially with flexion and extension  MMT: UE 4/5 throughout LE 4/5 throughout hips and 4+/5 knees         TODAY'S TREATMENT:  11/30   Bridge 2x10  GTB rowing 2x10 Seated pelvic tilting 20x (reviewed for home) T/S open book 5s 10x  Pec stretch 30s 3x  Manual therapy: bilat anterior shoulder/pec deep STM with pin and stretch;  T/S PA grade IV T5-10   11/23   L1-5 CPA and UPA bilat grade IV C/S LAD grade III, subocciptial hold C2-7 UPA bilat grade II STM: bilat L/S paraspinal and R lower T/S paraspinal   Bridge 2x10 Rowing 15x GTB Doorway pec stretch 30s 3x each Curl up 2x10     11/21             L1-5 CPA and UPA bilat grade IV C/S LAD grade II STM:  bilat L/S paraspinal and R lower T/S paraspinal Suboccipital release  Child's pose 3s 10x Cat cow 10 each Open book 5s 10x   11/16             Chin tucks 1s 10x (VC and TC needed for acceptable range and technique)             YTB external rotation with scap retraction 2x10               TPDN with skilled palpation and monitoring (bil upper traps),  STM following, C2-5 CPA grade II-III   11/14             Cervical rotation SNAG             Active cervical motions/stretches with  avoiding extension motion             TPDN with skilled palpation and monitoring (bil upper traps), STM following, traction, gross stretching, lateral mobilizations Rt to Lt                11/10             AquaticREHABdocumentation: Water will provide increased arousal using the property of surface tension as this patient struggles with lethargy which impairs the cognitive processing., Water will aid with movement using the current and laminar flow while the buoyancy reduces weight bearing, and Hydrostatic pressure also supports joints by unweighting joint load by at least 50 % in 3-4 feet depth water. 80% in chest to neck deep water.             Entered pool via stairs without use of hand rail, depth up to 4', temp 95 deg.              Forward walking with abdominal engagement Manual STM to suboccipitals with mobile floating for stretch and joint mobility; also performed with bilateral QLs             Float planks with belt under pelvis             Knee/elbow planks on steps   11/4             AquaticREHABdocumentation: Water will provide increased arousal using the property of surface tension as this patient struggles with lethargy which impairs the cognitive processing., Water will aid with movement using the current and laminar flow while the buoyancy reduces weight bearing, and Hydrostatic pressure also supports joints by unweighting joint load by at least 50 % in 3-4 feet depth water. 80% in chest to neck  deep water. Pt entered pool via stairs, depth up to 4', temp 94 deg.  Walking with arms to side for chest stretch             Backward walking- arms to side for resisted scap retraction             Triceps press down & straight arm press down with dumbbells             Both hands on one dumbbell- hip hinge with press down             Triceps press ups from seated on bench                         Added V sit, curl ins                         Altered to press down on water bar for decreased stability             Manual tehrapy: STM to bil upper traps   1      Eval: Correct postural alignment Scap retraction  Upper trap & levator stretch Lumbar flexion stretch in seated   PATIENT EDUCATION:  Education details: anatomy, exercise progression, DOMS expectations, muscle firing,  envelope of function, HEP, POC Person educated: Patient Education method: Explanation Education comprehension: verbalized understanding     HOME EXERCISE PROGRAM: Access Code: [email or web address] URL: [email or web address] Date: 03/09/2021 Prepared by: Daleen Bo  Exercises Theracane Over Shoulder - 1 x daily - 7 x weekly - 3 sets - 10 reps Sidelying Open Book - 1 x daily - 7 x weekly -  3 sets - 3 breaths hold Neutral Lumbar Spine Curl Up - 1 x daily - 7 x weekly - 1 sets - 10 reps Standing Shoulder Row with Anchored Resistance - 1 x daily - 7 x weekly - 2 sets - 10 reps Shoulder External Rotation and Scapular Retraction with Resistance - 1 x daily - 7 x weekly - 2 sets - 8 reps Supine Bridge - 1 x daily - 7 x weekly - 2 sets - 10 reps    ASSESSMENT:   CLINICAL IMPRESSION: Pt with good response to thoracic mobility and anterior pec mobility intervention today with report of decreased T/S pain at end of session. Pt able to progress rowing exercise band strength today in order to promote better postural stability. Pt HEP updated that time and given edu about strength being a better long term pain  solution. Plan to including a hip hinging exercise at next session. Pt would benefit from continued skilled therapy in order to reach goals and maximize functional postural strength and ROM for prevention of functional decline.   REHAB POTENTIAL: Good   CLINICAL DECISION MAKING: Evolving/moderate complexity   EVALUATION COMPLEXITY: Moderate     GOALS:    SHORT TERM GOALS:   STG Name Target Date Goal status  1 Pt will be able to demo proper resting postural alignment Baseline: still has to correct when she remembers but is able to correct 02/11/2021 achieved  2 Pt will be independent in daily stretching program  Baseline: achieved at this time 02/11/2021 achieved                                                 LONG TERM GOALS:    LTG Name Target Date Goal status  1 ODI to improve my MDC of 12 points Baseline: scored 17 points at eval 03/18/2021 INITIAL  2 NDI to improve by [place] of 10 points Baseline: scored 21 points at eval 03/18/2021 INITIAL  3 Pt will be able to bend from a standing position to reach objects on floor with minimal to no discomfort Baseline:very limited at eval 03/18/2021 INITIAL  4 Pt will be able to stand to wash dishes  Baseline: will work on recognition of necessary postural changes and breaks to improve endurance 03/18/2021 INITIAL  5 Pt will be independent in long term exercise to continue advancing strength Baseline:will progress as appropriate 03/18/2021 INITIAL                      PLAN: PT FREQUENCY: 2x/week   PT DURATION: 8 weeks   PLANNED INTERVENTIONS: Therapeutic exercises, Therapeutic activity, Neuro Muscular re-education, Balance training, Gait training, Patient/Family education, Joint mobilization, Stair training, Aquatic Therapy, Dry Needling, Spinal mobilization, Cryotherapy, Moist heat, and Manual therapy   PLAN FOR NEXT SESSION: anterior chest flexibility, periscap activation; core engagement to decrease lumbar spasm, hip hinge/bending  tolerance, TPDN   Daleen Bo PT DPT  03/09/2021, 3:37 PM

## 2021-03-09 NOTE — Patient Instructions (Addendum)
We have sent in the hydrocodone to use up to daily as needed. The state only lets [place] send in 5 days initially so you will need to check in with [place] to let us know if this is working.  We have also sent in lidocaine patches to try using.   We are doing labs today. To check the adrenal gland you will have to come back for labs another day before 9am.

## 2021-03-09 NOTE — Progress Notes (Signed)
   Subjective:   Patient ID: [name], female    DOB: 05/05/1970, 50 y.o.   MRN: [id number]  HPI The patient is a 50 YO female coming in for concerns about ongoing back pain and some questions about labs.   Review of Systems  Constitutional:  Positive for fatigue.  HENT: Negative.    Eyes: Negative.   Respiratory:  Negative for cough, chest tightness and shortness of breath.   Cardiovascular:  Negative for chest pain, palpitations and leg swelling.  Gastrointestinal:  Negative for abdominal distention, abdominal pain, constipation, diarrhea, nausea and vomiting.  Musculoskeletal:  Positive for arthralgias, back pain and myalgias.  Skin: Negative.   Neurological: Negative.   Psychiatric/Behavioral: Negative.     Objective:  Physical Exam Constitutional:      Appearance: She is well-developed.  HENT:     Head: Normocephalic and atraumatic.  Cardiovascular:     Rate and Rhythm: Normal rate and regular rhythm.  Pulmonary:     Effort: Pulmonary effort is normal. No respiratory distress.     Breath sounds: Normal breath sounds. No wheezing or rales.  Abdominal:     General: Bowel sounds are normal. There is no distension.     Palpations: Abdomen is soft.     Tenderness: There is no abdominal tenderness. There is no rebound.  Musculoskeletal:     Cervical back: Normal range of motion.  Skin:    General: Skin is warm and dry.  Neurological:     Mental Status: She is alert and oriented to person, place, and time.     Coordination: Coordination normal.    Vitals:   03/09/21 1104  BP: 124/70  Pulse: 74  Resp: 18  SpO2: 98%  Height: 5\' 3"  (1.6 m)   This visit occurred during the SARS-CoV-2 public health emergency.  Safety protocols were in place, including screening questions prior to the visit, additional usage of staff PPE, and extensive cleaning of exam room while observing appropriate contact time as indicated for disinfecting solutions.   Assessment & Plan:   Visit time 25 minutes in face to face communication with patient and coordination of care, additional 10 minutes spent in record review, coordination or care, ordering tests, communicating/referring to other healthcare professionals, documenting in medical records all on the same day of the visit for total time 35 minutes spent on the visit.

## 2021-03-10 ENCOUNTER — Telehealth: Payer: Self-pay | Admitting: Internal Medicine

## 2021-03-10 NOTE — Telephone Encounter (Signed)
Patient calling in  Patient says she spoke w/ pharmacy & they advised her PA was needed for rx lidocaine (LIDODERM) 5 %  Please let patient know PA has been started [phone]

## 2021-03-11 DIAGNOSIS — M2550 Pain in unspecified joint: Secondary | ICD-10-CM | POA: Insufficient documentation

## 2021-03-11 NOTE — Assessment & Plan Note (Signed)
She is seeing multiple specialists for her back pain and has been prescribed many different agents for this. She has taken muscle relaxers flexeril and robaxin without relief and some grogginess. She has tried celebrex which does not help and causes stomach upset. She has tried gabapentin back when she was going through cancer treatment and this did not help her back pain well. She does not want something she has to take daily (we discussed lyrica, cymbalta and elavil as options). She is still pursuing injection in the back and this is delayed for unknown reason. She has taken tramadol which she thinks made the pain worse. She has tried hydrocodone 1/2 pill which did okay. We did discuss opioid hyperalgesia which can happen with any opioid if taken long term which can cause more pain after taking opioids. [place] narcotic database reviewed. Rx hydrocodone 1 pill daily prn 5 day script. Reviewed that she would need to check in with [place] on how this is working and depending on dosing and frequency we may not continue this long term.

## 2021-03-11 NOTE — Assessment & Plan Note (Addendum)
She is concerned about autoimmune arthritis and wants to be checked for these. She does have joint pains diffuse and predominantly back pain. Checking ANA, comprehensive panel and RF. Reordered cortisol to be done before 9AM as last reading was inconclusive and she wishes to pursue definitive results.

## 2021-03-12 LAB — ANA, IFA COMPREHENSIVE PANEL
Anti Nuclear Antibody (ANA): POSITIVE — AB
ENA SM Ab Ser-aCnc: <1 AI
SM/RNP: <1 AI
SSA (Ro) (ENA) Antibody, IgG: <1 AI
SSB (La) (ENA) Antibody, IgG: <1 AI
Scleroderma (Scl-70) (ENA) Antibody, IgG: <1 AI
ds DNA Ab: 1 IU/mL

## 2021-03-12 LAB — RHEUMATOID FACTOR: Rheumatoid fact SerPl-aCnc: <14 IU/mL (ref <14)

## 2021-03-12 LAB — ANTI-NUCLEAR AB-TITER (ANA TITER): ANA Titer 1: 1:40 {titer} — ABNORMAL HIGH

## 2021-03-13 ENCOUNTER — Encounter: Payer: Self-pay | Admitting: Internal Medicine

## 2021-03-14 ENCOUNTER — Ambulatory Visit: Payer: 59 | Admitting: Physical Medicine and Rehabilitation

## 2021-03-14 ENCOUNTER — Encounter: Payer: Self-pay | Admitting: Physical Medicine and Rehabilitation

## 2021-03-16 ENCOUNTER — Encounter (HOSPITAL_BASED_OUTPATIENT_CLINIC_OR_DEPARTMENT_OTHER): Payer: Self-pay | Admitting: Physical Therapy

## 2021-03-16 ENCOUNTER — Other Ambulatory Visit: Payer: Self-pay

## 2021-03-16 ENCOUNTER — Ambulatory Visit (HOSPITAL_BASED_OUTPATIENT_CLINIC_OR_DEPARTMENT_OTHER): Payer: 59 | Attending: Orthopaedic Surgery | Admitting: Physical Therapy

## 2021-03-16 DIAGNOSIS — G8929 Other chronic pain: Secondary | ICD-10-CM | POA: Diagnosis present

## 2021-03-16 DIAGNOSIS — M6281 Muscle weakness (generalized): Secondary | ICD-10-CM | POA: Diagnosis present

## 2021-03-16 DIAGNOSIS — M545 Low back pain, unspecified: Secondary | ICD-10-CM | POA: Insufficient documentation

## 2021-03-16 DIAGNOSIS — M542 Cervicalgia: Secondary | ICD-10-CM | POA: Diagnosis not present

## 2021-03-16 DIAGNOSIS — R262 Difficulty in walking, not elsewhere classified: Secondary | ICD-10-CM | POA: Diagnosis present

## 2021-03-16 NOTE — Therapy (Signed)
OUTPATIENT PHYSICAL THERAPY PROGRESS NOTE   Patient Name: [name] MRN: [id number] DOB:1970-06-08, 50 y.o., female Today's Date: 03/16/2021  PCP: Hoyt Koch, MD REFERRING PROVIDER: Vanetta Mulders, MD   PT End of Session - 03/16/21 1301     Visit Number 12    Number of Visits 17    Date for PT Re-Evaluation 03/18/21    Authorization Type [employer]    PT Start Time 1300    PT Stop Time 1340    PT Time Calculation (min) 40 min    Activity Tolerance Patient tolerated treatment well    Behavior During Therapy [facility] for tasks assessed/performed              Past Medical History:  Diagnosis Date   Abdominal pain, epigastric 11/20/2019   Asthma    as a child   Cervical dysplasia    Gastroesophageal reflux disease 11/20/2019   Genetic testing 02/01/2018   Genetic testing was performed in 2015. The [employer] panel was ordered. In total, 23 genes were analyzed as part of this panel: ATM, BARD1, BRCA1, BRCA2, BRIP1, CDH1, CHEK2, EPCAM, MLH1, MRE11A, MSH2, MSH6, MUTYH, NBN, NF1, PALB2, PMS2, PTEN, RAD50, RAD51C, RAD51D, STK11, and TP53. No pathogenic variants were detected. Genetic testing did detect a Variant of Unknown Significance (VUS) at the t   GERD (gastroesophageal reflux disease)    had during chemo   Headache 06/12/2013   History of chemotherapy    History of kidney stones    History of radiation therapy 11/24/13- 01/08/14   reconstructed right breast/chest wall 4500 cGy 25 sessions, electron beam boost 1440 cGy 8 sessions   Liver masses 12/06/2016   Major depressive disorder, in remission 03/12/2017   Malignant neoplasm of lower-inner quadrant of right breast of female, estrogen receptor positive 2012   Pneumonia    PONV (postoperative nausea and vomiting)    Recurrent cancer of right breast 12/22/2013   Thrombocytopenia 05/22/2013   Past Surgical History:  Procedure Laterality Date   ADENOIDECTOMY     BREAST LUMPECTOMY WITH NEEDLE LOCALIZATION  Right 05/05/2013   Procedure: EXCISION RECURRENT CANCER RIGHT BREAST WITH NEEDLE LOCALOZATION;  Surgeon: Adin Hector, MD;  Location: [place];  Service: General;  Laterality: Right;   BREAST RECONSTRUCTION  06/29/2011   Procedure: BREAST RECONSTRUCTION;  Surgeon: Theodoro Kos, DO;  Location: [place];  Service: Plastics;  Laterality: Bilateral;  Immediate Bilateral Breast Reconstruction with Bilateral Tissue Expanders and placement of  Alloderm    DILATION AND CURETTAGE OF UTERUS     fibroid tumor surgery     lazy eye     corrective surgery   MASTECTOMY W/ SENTINEL NODE BIOPSY  06/29/2011   Procedure: MASTECTOMY WITH SENTINEL LYMPH NODE BIOPSY;  Surgeon: Adin Hector, MD;  Location: [place];  Service: General;  Laterality: Right;  right skin spairing mstectomy and right sentinel lymph node biopsy   PORT-A-CATH REMOVAL  03/12/2012   Procedure: MINOR REMOVAL PORT-A-CATH;  Surgeon: Adin Hector, MD;  Location: [place];  Service: General;  Laterality: N/A;  Upper left   PORTACATH PLACEMENT Left 05/05/2013   Procedure: INSERTION PORT-A-CATH;  Surgeon: Adin Hector, MD;  Location: [place];  Service: General;  Laterality: Left;   RHINOPLASTY     uterine cervical treatments  2002   for precancerous lesions   Patient Active Problem List   Diagnosis Date Noted   Arthralgia 03/11/2021   Back pain 02/26/2021   Neck pain  02/26/2021   Gastroesophageal reflux disease 11/20/2019   Major depressive disorder, in remission 03/12/2017   Liver masses 12/06/2016   Recurrent cancer of right breast 12/22/2013   Family history of malignant neoplasm of ovary 07/25/2013   Family history of malignant neoplasm of breast 07/25/2013   Family history of malignant neoplasm of gastrointestinal tract 07/25/2013   Headache 06/12/2013   Thrombocytopenia 05/22/2013   Acquired absence of bilateral breasts and nipples 11/23/2011   Malignant neoplasm of lower-inner quadrant of right breast of female,  estrogen receptor positive 2012     REFERRING DIAG: M54.50,G89.29 (ICD-10-CM) - Chronic low back pain, unspecified back pain laterality, unspecified whether sciatica present M54.2 (ICD-10-CM) - Cervical pain (neck)    THERAPY DIAG:  Difficulty in walking, not elsewhere classified   Cervicalgia   Muscle weakness (generalized)   Pain, lumbar region   PERTINENT HISTORY: h/o breast CA with reconstruction, fat removal from bil thighs, migraines   PRECAUTIONS: none     SUBJECTIVE: Pt states she did not have pain after last session. She states that over the weekend her back was hurting while standing to decorate. She denies heavy lifting. She states her legs feel tight and they are hurting today.   PAIN:  Are you having pain? Yes VAS scale: 6/10 Pain location: lumbar Pain orientation: Bilateral  PAIN TYPE: aching and tension Pain description: intermittent  Aggravating factors: sitting unsupported, leaning fwd Relieving factors: lay down       OBJECTIVE:  DIAGNOSTIC FINDINGS:  Cervical & lumbar MRI 10/13   PATIENT SURVEYS:  Oswestry Score: 22 / 50 or 44 % Neck Disability Index score: 20 / 50 = 40.0 %  Lumbar ROM: flex 75% with p! ext 50% p! L rot  75% R rot 75% L SB 50% R SB 50%  C/S ROM: globally WFL throughout ROM due to stiffness and pain, especially with flexion and extension  MMT: UE 4/5 throughout LE 4/5 throughout hips and 4+/5 knees         TODAY'S TREATMENT:  12/7 L/S standing extension Review of pacing of activity at home, ergonomic positioning during fwd flexed position Doorway pec stretch review         TPDN with skilled palpation and monitoring,  STM following,  L3-5 paraspinals  11/30   Bridge 2x10  GTB rowing 2x10 Seated pelvic tilting 20x (reviewed for home) T/S open book 5s 10x  Pec stretch 30s 3x  Manual therapy: bilat anterior shoulder/pec deep STM with pin and stretch;  T/S PA grade IV T5-10   11/23   L1-5 CPA and UPA bilat grade  IV C/S LAD grade III, subocciptial hold C2-7 UPA bilat grade II STM: bilat L/S paraspinal and R lower T/S paraspinal   Bridge 2x10 Rowing 15x GTB Doorway pec stretch 30s 3x each Curl up 2x10     11/21             L1-5 CPA and UPA bilat grade IV C/S LAD grade II STM: bilat L/S paraspinal and R lower T/S paraspinal Suboccipital release  Child's pose 3s 10x Cat cow 10 each Open book 5s 10x   11/16             Chin tucks 1s 10x (VC and TC needed for acceptable range and technique)             YTB external rotation with scap retraction 2x10           11/14  Cervical rotation SNAG             Active cervical motions/stretches with avoiding extension motion             TPDN with skilled palpation and monitoring (bil upper traps), STM following, traction, gross stretching, lateral mobilizations Rt to Lt                11/10             AquaticREHABdocumentation: Water will provide increased arousal using the property of surface tension as this patient struggles with lethargy which impairs the cognitive processing., Water will aid with movement using the current and laminar flow while the buoyancy reduces weight bearing, and Hydrostatic pressure also supports joints by unweighting joint load by at least 50 % in 3-4 feet depth water. 80% in chest to neck deep water.             Entered pool via stairs without use of hand rail, depth up to 4', temp 95 deg.              Forward walking with abdominal engagement Manual STM to suboccipitals with mobile floating for stretch and joint mobility; also performed with bilateral QLs             Float planks with belt under pelvis             Knee/elbow planks on steps   11/4             AquaticREHABdocumentation: Water will provide increased arousal using the property of surface tension as this patient struggles with lethargy which impairs the cognitive processing., Water will aid with movement using the current and laminar flow while  the buoyancy reduces weight bearing, and Hydrostatic pressure also supports joints by unweighting joint load by at least 50 % in 3-4 feet depth water. 80% in chest to neck deep water. Pt entered pool via stairs, depth up to 4', temp 94 deg.  Walking with arms to side for chest stretch             Backward walking- arms to side for resisted scap retraction             Triceps press down & straight arm press down with dumbbells             Both hands on one dumbbell- hip hinge with press down             Triceps press ups from seated on bench                         Added V sit, curl ins                         Altered to press down on water bar for decreased stability             Manual tehrapy: STM to bil upper traps   1      Eval: Correct postural alignment Scap retraction  Upper trap & levator stretch Lumbar flexion stretch in seated   PATIENT EDUCATION:  Education details: increasing GPP, ergonomics, anatomy, exercise progression, DOMS expectations, muscle firing,  envelope of function, HEP, POC Person educated: Patient Education method: Explanation Education comprehension: verbalized understanding     HOME EXERCISE PROGRAM: Access Code: [email or web address] URL: [email or web address] Date: 03/09/2021 Prepared by: Daleen Bo  Exercises Theracane Over Shoulder - 1 x daily -  7 x weekly - 3 sets - 10 reps Sidelying Open Book - 1 x daily - 7 x weekly - 3 sets - 3 breaths hold Neutral Lumbar Spine Curl Up - 1 x daily - 7 x weekly - 1 sets - 10 reps Standing Shoulder Row with Anchored Resistance - 1 x daily - 7 x weekly - 2 sets - 10 reps Shoulder External Rotation and Scapular Retraction with Resistance - 1 x daily - 7 x weekly - 2 sets - 8 reps Supine Bridge - 1 x daily - 7 x weekly - 2 sets - 10 reps    ASSESSMENT:   CLINICAL IMPRESSION: Pt with significantly elevated pain and emotional status at today's session. Pt expresses frustration with current level of pain and  difficulty with ability to do ADL due to pain. Pt's L/S pain appears consistent with static fwd flexion positioning causing lumbar paraspinal spasm. Pt responded well to DN and STM with report of decreased pain. Pt given lumbar extensions in standing to perform in regular intervals during ADL to prevent significant pain elevation. Plan to trial thigh height pulling/deadlifting to begin strengthening of lumbopelvic region. Pt's pain is likely due to strength and endurance deficits. Pt would benefit from continued skilled therapy in order to reach goals and maximize functional postural strength and ROM for prevention of functional decline.   REHAB POTENTIAL: Good   CLINICAL DECISION MAKING: Evolving/moderate complexity   EVALUATION COMPLEXITY: Moderate     GOALS:    SHORT TERM GOALS:   STG Name Target Date Goal status  1 Pt will be able to demo proper resting postural alignment Baseline: still has to correct when she remembers but is able to correct 02/11/2021 achieved  2 Pt will be independent in daily stretching program  Baseline: achieved at this time 02/11/2021 achieved                                                 LONG TERM GOALS:    LTG Name Target Date Goal status  1 ODI to improve my MDC of 12 points Baseline: scored 17 points at eval 03/18/2021 INITIAL  2 NDI to improve by [place] of 10 points Baseline: scored 21 points at eval 03/18/2021 INITIAL  3 Pt will be able to bend from a standing position to reach objects on floor with minimal to no discomfort Baseline:very limited at eval 03/18/2021 INITIAL  4 Pt will be able to stand to wash dishes  Baseline: will work on recognition of necessary postural changes and breaks to improve endurance 03/18/2021 INITIAL  5 Pt will be independent in long term exercise to continue advancing strength Baseline:will progress as appropriate 03/18/2021 INITIAL                      PLAN: PT FREQUENCY: 2x/week   PT DURATION: 8 weeks   PLANNED  INTERVENTIONS: Therapeutic exercises, Therapeutic activity, Neuro Muscular re-education, Balance training, Gait training, Patient/Family education, Joint mobilization, Stair training, Aquatic Therapy, Dry Needling, Spinal mobilization, Cryotherapy, Moist heat, and Manual therapy   PLAN FOR NEXT SESSION: anterior chest flexibility, thigh height deadlifting, segmental flexion, hip hinge/bending tolerance, TPDN PRN   Daleen Bo PT DPT  03/16/2021, 1:51 PM

## 2021-03-18 ENCOUNTER — Encounter: Payer: Self-pay | Admitting: Internal Medicine

## 2021-03-18 ENCOUNTER — Ambulatory Visit (HOSPITAL_BASED_OUTPATIENT_CLINIC_OR_DEPARTMENT_OTHER): Payer: 59 | Admitting: Cardiology

## 2021-03-18 ENCOUNTER — Ambulatory Visit (HOSPITAL_BASED_OUTPATIENT_CLINIC_OR_DEPARTMENT_OTHER): Payer: 59 | Admitting: Physical Therapy

## 2021-03-18 ENCOUNTER — Other Ambulatory Visit: Payer: Self-pay

## 2021-03-18 ENCOUNTER — Ambulatory Visit (INDEPENDENT_AMBULATORY_CARE_PROVIDER_SITE_OTHER): Payer: 59 | Admitting: Internal Medicine

## 2021-03-18 VITALS — BP 118/70 | HR 72 | Resp 18 | Ht 63.0 in | Wt 130.0 lb

## 2021-03-18 DIAGNOSIS — M79605 Pain in left leg: Secondary | ICD-10-CM | POA: Diagnosis not present

## 2021-03-18 DIAGNOSIS — M545 Low back pain, unspecified: Secondary | ICD-10-CM

## 2021-03-18 DIAGNOSIS — R768 Other specified abnormal immunological findings in serum: Secondary | ICD-10-CM

## 2021-03-18 DIAGNOSIS — M542 Cervicalgia: Secondary | ICD-10-CM | POA: Diagnosis not present

## 2021-03-18 DIAGNOSIS — G8929 Other chronic pain: Secondary | ICD-10-CM

## 2021-03-18 MED ORDER — LIDOCAINE-PRILOCAINE 2.5-2.5 % EX CREA: 1.0000 "application " | TOPICAL_CREAM | CUTANEOUS | 3 refills | Status: DC | PRN

## 2021-03-18 NOTE — Telephone Encounter (Signed)
Pt made aware that her PA is pending.

## 2021-03-18 NOTE — Patient Instructions (Addendum)
You can wear a compression sock for the swelling.  You can try calcium or magnesium to help the muscles.

## 2021-03-18 NOTE — Assessment & Plan Note (Signed)
After injury and given reassurance that this will resolve. She does not have any signs clinically for blood clot and ultrasound is not required.

## 2021-03-18 NOTE — Progress Notes (Signed)
Benito Mccreedy D.[facility] [place] Phone: [phone]   Assessment and Plan:     1. Left knee pain, unspecified chronicity -Acute, no improvement, initial visit - Likely bony contusion to medial proximal tibia due to fall - Advised for RICE therapy, Tylenol/NSAIDs as needed for pain control - X-ray obtained in clinic.  My interpretation: No acute fracture or dislocation.  Unremarkable knee exam - DG Knee AP/LAT W/Sun[place] Left; Future  2. Somatic dysfunction of cervical region 3. Somatic dysfunction of thoracic region 4. Somatic dysfunction of lumbar region 5. Somatic dysfunction of pelvic region 6. Somatic dysfunction of rib region -Chronic with exacerbation, subsequent visit - Recurrence of typical musculoskeletal complaints with most prominent being in mid right thoracic region - Patient has received significant relief with OMT in the past.  Elects for repeat OMT today.  Tolerated well per note below. - Decision today to treat with OMT was based on Physical Exam   After verbal consent patient was treated with HVLA (high velocity low amplitude), ME (muscle energy), FPR (flex positional release), ST (soft tissue), PC/PD (Pelvic Compression/ Pelvic Decompression) techniques in cervical, rib, thoracic, lumbar, and pelvic areas. Patient tolerated the procedure well with improvement in symptoms.  Patient educated on potential side effects of soreness and recommended to rest, hydrate, and use Tylenol as needed for pain control.   Pertinent previous records reviewed include none   Follow Up: 4 weeks for reevaluation of knee pain.  If no improvement or worsening of symptoms could consider MRI versus ultrasound versus CSI.  Also 4-week follow-up for OMT3   Subjective:    I, Moenique Parris, am serving as a Education administrator for [employer]  Chief Complaint: chronic bilateral back pain follow up   HPI:   02/04/21 Patient is a 50  year old female presenting with Neck pain and was referred to sports medicine for evaluation and treatment. Patient sees neurology for the headaches and neck pain, was last seen on 02/01/21 by Dr. Tomi Likens for possible OMT. Today patient states LBP started first. 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 Headaches mild to severe and constant and located in the back. Dull constant achy pressure in neck. PT just started this week. Pain down left arm numbness and tingling. LBP spreads over back. Dull achy and pressure. Numbness and tingling doing down both legs. Lipedema in both legs   02/10/21 Patient states whole back has been hurting over the past 2 days. Did start PT and feels like everything is moving in a positive direction. No new complaints.   02/21/21 Patient states that her neck and head are hurting and took ibuprofen and Excedrin to help. Patient also had PT and had dry needling today.    03/07/21 Patient states that her back is not doing well. Patient had decorated for Christmas putting up her tree so she was in a lot of weird positions and she started to get the low back pain that radiated down both legs. Patient states that the pain hurt so bad she took an old tramadol Rx she had. This issue started last night.    03/21/2021  Patient states the neck is tight right in the middle today, has some discomfort since the other day and over the weekend    Relevant Historical Information:  History of breast cancer, ovarian cancer, spinal stenosis    Additional pertinent review of systems negative.  Current Outpatient  Medications  Medication Sig Dispense Refill   aspirin-acetaminophen-caffeine (EXCEDRIN MIGRAINE) 250-250-65 MG tablet Take by mouth every 6 (six) hours as needed for headache.     Biotin 5000 MCG CAPS Take 5,000 mcg by mouth daily.     Black Pepper-Turmeric (TURMERIC CURCUMIN) 08-998 MG CAPS Take by mouth 2 (two) times daily.     celecoxib  (CELEBREX) 200 MG capsule Take 1 capsule (200 mg total) by mouth 2 (two) times daily. 30 capsule 3   Cholecalciferol (VITAMIN D3) 125 MCG (5000 UT) TBDP Take 1 tablet by mouth daily.     Coenzyme Q10 (COQ10) 150 MG CAPS Take 1 capsule by mouth once.     cyclobenzaprine (FLEXERIL) 10 MG tablet Take 1 tablet (10 mg total) by mouth every 8 (eight) hours as needed for muscle spasms. 30 tablet 2   DULoxetine (CYMBALTA) 30 MG capsule Take 30 mg by mouth daily.     finasteride (PROSCAR) 5 MG tablet Take 5 mg by mouth daily. 1/2 tablet daily     ibuprofen (ADVIL) 800 MG tablet Take 1 tablet (800 mg total) by mouth every 8 (eight) hours as needed. 30 tablet 5   lidocaine (LIDODERM) 5 % Place 1 patch onto the skin daily. Remove & Discard patch within 12 hours or as directed by MD 30 patch 0   lidocaine-prilocaine (EMLA) cream Apply 1 application topically as needed. 100 g 3   methocarbamol (ROBAXIN) 500 MG tablet Take 1 tablet (500 mg total) by mouth 3 (three) times daily. 90 tablet 0   pantoprazole (PROTONIX) 40 MG tablet Take 1 tablet (40 mg total) by mouth daily. 30 tablet 11   Prenat-Fe Poly-Methfol-FA-DHA (VITAFOL FE+) 90-0.6-0.4-200 MG CAPS Take 1 tablet by mouth daily. 30 capsule 11   sucralfate (CARAFATE) 1 g tablet Take 1 tablet (1 g total) by mouth 4 (four) times daily -  with meals and at bedtime. 120 tablet 1   Ubrogepant (UBRELVY) 100 MG TABS Take 1 tablet by mouth as needed (May repeat in 2 hours.  maximum 2 tablets in 24 hours.). 16 tablet 5   No current facility-administered medications for this visit.      Objective:     Vitals:   03/21/21 1538  BP: 118/70  Pulse: 73  SpO2: 98%  Height: 5\' 3"  (1.6 m)      Body mass index is 23.03 kg/m.    Physical Exam:     General: Well-appearing, cooperative, sitting comfortably in no acute distress.   OMT Physical Exam:  ASIS Compression Test: Positive Right Cervical: TTP paraspinal, C3 RR SL Rib: Bilateral elevated first rib with  TTP Thoracic: TTP paraspinal, T4-8 RRSL Lumbar: NTTP paraspinal, no significant rotation Pelvis: Right anterior innominate  General:  awake, alert oriented, no acute distress nontoxic Skin: no suspicious lesions or rashes Neuro:sensation intact, no deficits, strength 5/5 with no deficits, no atrophy, normal muscle tone Psych: No signs of anxiety, depression or other mood disorder  Left Knee: No swelling No deformity Neg fluid wave, joint milking ROM Flex 110, Ext 0 TTP pes anserine bursa NTTP over the quad tendon, medial fem condyle, lat fem condyle, patella, plica, patella tendon, tibial tuberostiy, fibular head, posterior fossa,  gerdy's tubercle, medial jt line, lateral jt line Neg anterior and posterior drawer Neg lachman Neg sag sign Negative varus stress Negative valgus stress Negative McMurray Negative [place]ss[place]  Gait normal   Electronically signed by:  Benito Mccreedy D.[facility] 4:28 PM 03/21/21

## 2021-03-18 NOTE — Progress Notes (Signed)
   Subjective:   Patient ID: [name], female    DOB: Feb 19, 1971, 50 y.o.   MRN: [id number]  HPI The patient is a 50 YO female coming in for several concerns.   Review of Systems  Constitutional: Negative.   HENT: Negative.    Eyes: Negative.   Respiratory:  Negative for cough, chest tightness and shortness of breath.   Cardiovascular:  Negative for chest pain, palpitations and leg swelling.  Gastrointestinal:  Negative for abdominal distention, abdominal pain, constipation, diarrhea, nausea and vomiting.  Musculoskeletal:  Positive for arthralgias, back pain and myalgias.  Skin: Negative.   Neurological: Negative.   Psychiatric/Behavioral: Negative.     Objective:  Physical Exam Constitutional:      Appearance: She is well-developed.  HENT:     Head: Normocephalic and atraumatic.  Cardiovascular:     Rate and Rhythm: Normal rate and regular rhythm.  Pulmonary:     Effort: Pulmonary effort is normal. No respiratory distress.     Breath sounds: Normal breath sounds. No wheezing or rales.  Abdominal:     General: Bowel sounds are normal. There is no distension.     Palpations: Abdomen is soft.     Tenderness: There is no abdominal tenderness. There is no rebound.  Musculoskeletal:        General: Tenderness present.     Cervical back: Normal range of motion.     Comments: Left calf with bruising and tenderness, no signs of blood clot on exam, no redness or significant calf diameter difference left to right  Skin:    General: Skin is warm and dry.  Neurological:     Mental Status: She is alert and oriented to person, place, and time.     Coordination: Coordination normal.    Vitals:   03/18/21 1507  BP: 118/70  Pulse: 72  Resp: 18  SpO2: 97%  Weight: 130 lb (59 kg)  Height: 5\' 3"  (1.6 m)    This visit occurred during the SARS-CoV-2 public health emergency.  Safety protocols were in place, including screening questions prior to the visit, additional usage of  staff PPE, and extensive cleaning of exam room while observing appropriate contact time as indicated for disinfecting solutions.   Assessment & Plan:

## 2021-03-18 NOTE — Therapy (Signed)
OUTPATIENT PHYSICAL THERAPY TREATMENT NOTE   Patient Name: [name] MRN: [id number] DOB:[birth year], 50 y.o., female Today's Date: 03/18/2021  PCP: Hoyt Koch, MD REFERRING PROVIDER: Vanetta Mulders, MD   PT End of Session - 03/18/21 1311     Visit Number 13    Number of Visits 25    Date for PT Re-Evaluation 04/29/21    Authorization Type [employer]    PT Start Time 1300    PT Stop Time 2683    PT Time Calculation (min) 43 min    Activity Tolerance Patient tolerated treatment well    Behavior During Therapy [facility] for tasks assessed/performed             Past Medical History:  Diagnosis Date   Abdominal pain, epigastric 11/20/2019   Asthma    as a child   Cervical dysplasia    Gastroesophageal reflux disease 11/20/2019   Genetic testing 02/01/2018   Genetic testing was performed in 2015. The [employer] panel was ordered. In total, 23 genes were analyzed as part of this panel: ATM, BARD1, BRCA1, BRCA2, BRIP1, CDH1, CHEK2, EPCAM, MLH1, MRE11A, MSH2, MSH6, MUTYH, NBN, NF1, PALB2, PMS2, PTEN, RAD50, RAD51C, RAD51D, STK11, and TP53. No pathogenic variants were detected. Genetic testing did detect a Variant of Unknown Significance (VUS) at the t   GERD (gastroesophageal reflux disease)    had during chemo   Headache 06/12/2013   History of chemotherapy    History of kidney stones    History of radiation therapy 11/24/13- 01/08/14   reconstructed right breast/chest wall 4500 cGy 25 sessions, electron beam boost 1440 cGy 8 sessions   Liver masses 12/06/2016   Major depressive disorder, in remission 03/12/2017   Malignant neoplasm of lower-inner quadrant of right breast of female, estrogen receptor positive 2012   Pneumonia    PONV (postoperative nausea and vomiting)    Recurrent cancer of right breast 12/22/2013   Thrombocytopenia 05/22/2013   Past Surgical History:  Procedure Laterality Date   ADENOIDECTOMY     BREAST LUMPECTOMY WITH NEEDLE LOCALIZATION  Right 05/05/2013   Procedure: EXCISION RECURRENT CANCER RIGHT BREAST WITH NEEDLE LOCALOZATION;  Surgeon: Adin Hector, MD;  Location: [place];  Service: General;  Laterality: Right;   BREAST RECONSTRUCTION  06/29/2011   Procedure: BREAST RECONSTRUCTION;  Surgeon: Theodoro Kos, DO;  Location: [place];  Service: Plastics;  Laterality: Bilateral;  Immediate Bilateral Breast Reconstruction with Bilateral Tissue Expanders and placement of  Alloderm    DILATION AND CURETTAGE OF UTERUS     fibroid tumor surgery     lazy eye     corrective surgery   MASTECTOMY W/ SENTINEL NODE BIOPSY  06/29/2011   Procedure: MASTECTOMY WITH SENTINEL LYMPH NODE BIOPSY;  Surgeon: Adin Hector, MD;  Location: [place];  Service: General;  Laterality: Right;  right skin spairing mstectomy and right sentinel lymph node biopsy   PORT-A-CATH REMOVAL  03/12/2012   Procedure: MINOR REMOVAL PORT-A-CATH;  Surgeon: Adin Hector, MD;  Location: [place];  Service: General;  Laterality: N/A;  Upper left   PORTACATH PLACEMENT Left 05/05/2013   Procedure: INSERTION PORT-A-CATH;  Surgeon: Adin Hector, MD;  Location: [place];  Service: General;  Laterality: Left;   RHINOPLASTY     uterine cervical treatments  2002   for precancerous lesions   Patient Active Problem List   Diagnosis Date Noted   Arthralgia 03/11/2021   Back pain 02/26/2021   Neck pain 02/26/2021  Gastroesophageal reflux disease 11/20/2019   Major depressive disorder, in remission 03/12/2017   Liver masses 12/06/2016   Recurrent cancer of right breast 12/22/2013   Family history of malignant neoplasm of ovary 07/25/2013   Family history of malignant neoplasm of breast 07/25/2013   Family history of malignant neoplasm of gastrointestinal tract 07/25/2013   Headache 06/12/2013   Thrombocytopenia 05/22/2013   Acquired absence of bilateral breasts and nipples 11/23/2011   Malignant neoplasm of lower-inner quadrant of right breast of female,  estrogen receptor positive 2012     REFERRING DIAG: M54.50,G89.29 (ICD-10-CM) - Chronic low back pain, unspecified back pain laterality, unspecified whether sciatica present M54.2 (ICD-10-CM) - Cervical pain (neck)    THERAPY DIAG:  Difficulty in walking, not elsewhere classified   Cervicalgia   Muscle weakness (generalized)   Pain, lumbar region   PERTINENT HISTORY: h/o breast CA with reconstruction, fat removal from bil thighs, migraines   PRECAUTIONS: none     SUBJECTIVE: Pt states she did not have pain after last session. She states that over the weekend her back was hurting while standing to decorate. She denies heavy lifting. She states her legs feel tight and they are hurting today.    PAIN:  Are you having pain? Yes VAS scale: 6/10 Pain location: lumbar Pain orientation: Bilateral  PAIN TYPE: aching and tension Pain description: intermittent  Aggravating factors: sitting unsupported, leaning fwd Relieving factors: lay down       OBJECTIVE:  DIAGNOSTIC FINDINGS:  Cervical & lumbar MRI 10/13   PATIENT SURVEYS:  Oswestry Score: 22 / 50 or 44 % Neck Disability Index score: 20 / 50 = 40.0 %   Lumbar ROM: flex 75% with p! ext 50% p! L rot  75% R rot 75% L SB 50% R SB 50%   C/S ROM: globally WFL throughout ROM due to stiffness and pain, especially with flexion and extension   MMT: UE 4/5 throughout LE 4/5 throughout hips and 4+/5 knees         TODAY'S TREATMENT: 12/9  Bilateral ER 3x10 red   Horizontal abduction 3x10 red  Horizontal abduction with flexion red 3x10   Nu-step 5 min     12/7 L/S standing extension Review of pacing of activity at home, ergonomic positioning during fwd flexed position Doorway pec stretch review          TPDN with skilled palpation and monitoring,  STM following,  L3-5 paraspinals   11/30    Bridge 2x10  GTB rowing 2x10 Seated pelvic tilting 20x (reviewed for home) T/S open book 5s 10x  Pec stretch 30s 3x    Manual therapy: bilat anterior shoulder/pec deep STM with pin and stretch;  T/S PA grade IV T5-10   11/23   L1-5 CPA and UPA bilat grade IV C/S LAD grade III, subocciptial hold C2-7 UPA bilat grade II STM: bilat L/S paraspinal and R lower T/S paraspinal   Bridge 2x10 Rowing 15x GTB Doorway pec stretch 30s 3x each Curl up 2x10     11/21             L1-5 CPA and UPA bilat grade IV C/S LAD grade II STM: bilat L/S paraspinal and R lower T/S paraspinal Suboccipital release  Child's pose 3s 10x Cat cow 10 each Open book 5s 10x   11/16             Chin tucks 1s 10x (VC and TC needed for acceptable range and technique)  YTB external rotation with scap retraction 2x10           11/14             Cervical rotation SNAG             Active cervical motions/stretches with avoiding extension motion             TPDN with skilled palpation and monitoring (bil upper traps), STM following, traction, gross stretching, lateral mobilizations Rt to Lt                11/10             AquaticREHABdocumentation: Water will provide increased arousal using the property of surface tension as this patient struggles with lethargy which impairs the cognitive processing., Water will aid with movement using the current and laminar flow while the buoyancy reduces weight bearing, and Hydrostatic pressure also supports joints by unweighting joint load by at least 50 % in 3-4 feet depth water. 80% in chest to neck deep water.             Entered pool via stairs without use of hand rail, depth up to 4', temp 95 deg.              Forward walking with abdominal engagement Manual STM to suboccipitals with mobile floating for stretch and joint mobility; also performed with bilateral QLs             Float planks with belt under pelvis             Knee/elbow planks on steps   11/4             AquaticREHABdocumentation: Water will provide increased arousal using the property of surface tension as this  patient struggles with lethargy which impairs the cognitive processing., Water will aid with movement using the current and laminar flow while the buoyancy reduces weight bearing, and Hydrostatic pressure also supports joints by unweighting joint load by at least 50 % in 3-4 feet depth water. 80% in chest to neck deep water. Pt entered pool via stairs, depth up to 4', temp 94 deg.  Walking with arms to side for chest stretch             Backward walking- arms to side for resisted scap retraction             Triceps press down & straight arm press down with dumbbells             Both hands on one dumbbell- hip hinge with press down             Triceps press ups from seated on bench                         Added V sit, curl ins                         Altered to press down on water bar for decreased stability             Manual tehrapy: STM to bil upper traps   1      Eval: Correct postural alignment Scap retraction  Upper trap & levator stretch Lumbar flexion stretch in seated   PATIENT EDUCATION:  Education details: increasing GPP, ergonomics, anatomy, exercise progression, DOMS expectations, muscle firing,  envelope of function, HEP, POC Person educated: Patient Education method: Explanation Education  comprehension: verbalized understanding     HOME EXERCISE PROGRAM: Access Code: [email or web address] URL: [email or web address] Date: 03/09/2021 Prepared by: Daleen Bo   Exercises Theracane Over Shoulder - 1 x daily - 7 x weekly - 3 sets - 10 reps Sidelying Open Book - 1 x daily - 7 x weekly - 3 sets - 3 breaths hold Neutral Lumbar Spine Curl Up - 1 x daily - 7 x weekly - 1 sets - 10 reps Standing Shoulder Row with Anchored Resistance - 1 x daily - 7 x weekly - 2 sets - 10 reps Shoulder External Rotation and Scapular Retraction with Resistance - 1 x daily - 7 x weekly - 2 sets - 8 reps Supine Bridge - 1 x daily - 7 x weekly - 2 sets - 10 reps     ASSESSMENT:   CLINICAL  IMPRESSION: Patient continues to report flair-ups, but overall she is doing well today. She is frustrated by the fact the pain increases with her daily activity. She reports that  her pain goes between her neck, back, and t-spine. Her objective measures were assessed last week so they were not assessed again. Her NDI and Oswestry have both improved. Ut appears that her lumbar motion and strength have improved as well. She would benefit from further skilled therapy 2W6 to address functional goals. Today patient was given a seated set of postural exercises as for home and we expanded upon her supine exercises. She is becoming very stressed by her flair ups. She was advised that stress can increase pain levels. She was advised to practice deep breathing and relaxation if she recognizes that she is in that state. We will work on the next few visits on progressing exercises and working on pain relief and stress relief  REHAB POTENTIAL: Good   CLINICAL DECISION MAKING: Evolving/moderate complexity   EVALUATION COMPLEXITY: Moderate     GOALS:     SHORT TERM GOALS:   STG Name Target Date Goal status  1 Pt will be able to demo proper resting postural alignment Baseline: still has to correct when she remembers but is able to correct 02/11/2021 achieved  2 Pt will be independent in daily stretching program  Baseline: achieved at this time 02/11/2021 achieved                                                 LONG TERM GOALS:    LTG Name Target Date Goal status  1 ODI to improve my MDC of 12 points Baseline: scored 17 points at eval 03/18/2021 INITIAL  2 NDI to improve by MDC of 10 points Baseline: scored 21 points at eval 03/18/2021 INITIAL  3 Pt will be able to bend from a standing position to reach objects on floor with minimal to no discomfort Baseline:very limited at eval 03/18/2021 INITIAL  4 Pt will be able to stand to wash dishes  Baseline: will work on recognition of necessary postural  changes and breaks to improve endurance 03/18/2021 INITIAL  5 Pt will be independent in long term exercise to continue advancing strength Baseline:will progress as appropriate 03/18/2021 INITIAL                      PLAN: PT FREQUENCY: 2x/week   PT DURATION: 6 weeks   PLANNED INTERVENTIONS: Therapeutic exercises, Therapeutic activity, Neuro Muscular re-education, Balance  training, Gait training, Patient/Family education, Joint mobilization, Stair training, Aquatic Therapy, Dry Needling, Spinal mobilization, Cryotherapy, Moist heat, and Manual therapy   PLAN FOR NEXT SESSION: anterior chest flexibility, thigh height deadlifting, segmental flexion, hip hinge/bending tolerance, TPDN PRN   Carney Living PT DPT  03/18/2021, 4:39 PM

## 2021-03-18 NOTE — Addendum Note (Signed)
Addended by: Carney Living on: 03/18/2021 04:58 PM   Modules accepted: Orders

## 2021-03-18 NOTE — Assessment & Plan Note (Signed)
We did discuss that this is a low positive and most likely a false positive. Given her multiple arthralgia and myalgias she feels it make sense to talk to a rheumatologist as a consult. Referral placed.

## 2021-03-21 ENCOUNTER — Encounter: Payer: Self-pay | Admitting: Internal Medicine

## 2021-03-21 ENCOUNTER — Ambulatory Visit (INDEPENDENT_AMBULATORY_CARE_PROVIDER_SITE_OTHER): Payer: 59

## 2021-03-21 ENCOUNTER — Ambulatory Visit (INDEPENDENT_AMBULATORY_CARE_PROVIDER_SITE_OTHER): Payer: 59 | Admitting: Sports Medicine

## 2021-03-21 ENCOUNTER — Other Ambulatory Visit: Payer: Self-pay

## 2021-03-21 ENCOUNTER — Encounter (HOSPITAL_BASED_OUTPATIENT_CLINIC_OR_DEPARTMENT_OTHER): Payer: Self-pay | Admitting: Physical Therapy

## 2021-03-21 ENCOUNTER — Ambulatory Visit (HOSPITAL_BASED_OUTPATIENT_CLINIC_OR_DEPARTMENT_OTHER): Payer: 59 | Admitting: Physical Therapy

## 2021-03-21 VITALS — BP 118/70 | HR 73 | Ht 63.0 in

## 2021-03-21 DIAGNOSIS — M545 Low back pain, unspecified: Secondary | ICD-10-CM

## 2021-03-21 DIAGNOSIS — M9905 Segmental and somatic dysfunction of pelvic region: Secondary | ICD-10-CM

## 2021-03-21 DIAGNOSIS — M542 Cervicalgia: Secondary | ICD-10-CM | POA: Diagnosis not present

## 2021-03-21 DIAGNOSIS — M9901 Segmental and somatic dysfunction of cervical region: Secondary | ICD-10-CM | POA: Diagnosis not present

## 2021-03-21 DIAGNOSIS — G8929 Other chronic pain: Secondary | ICD-10-CM

## 2021-03-21 DIAGNOSIS — M9908 Segmental and somatic dysfunction of rib cage: Secondary | ICD-10-CM

## 2021-03-21 DIAGNOSIS — M25562 Pain in left knee: Secondary | ICD-10-CM | POA: Diagnosis not present

## 2021-03-21 DIAGNOSIS — R262 Difficulty in walking, not elsewhere classified: Secondary | ICD-10-CM

## 2021-03-21 DIAGNOSIS — M6281 Muscle weakness (generalized): Secondary | ICD-10-CM

## 2021-03-21 DIAGNOSIS — M9902 Segmental and somatic dysfunction of thoracic region: Secondary | ICD-10-CM

## 2021-03-21 DIAGNOSIS — M9903 Segmental and somatic dysfunction of lumbar region: Secondary | ICD-10-CM | POA: Diagnosis not present

## 2021-03-21 NOTE — Patient Instructions (Addendum)
Good to see you  Re-check left knee  4 week follow up for repeat OMT

## 2021-03-21 NOTE — Therapy (Signed)
OUTPATIENT PHYSICAL THERAPY TREATMENT NOTE   Patient Name: [name] MRN: [id number] DOB:[birth year], 50 y.o., female Today's Date: 03/21/2021  PCP: Hoyt Koch, MD REFERRING PROVIDER: Vanetta Mulders, MD   PT End of Session - 03/21/21 1449     Visit Number 14    Number of Visits 25    Date for PT Re-Evaluation 04/29/21    Authorization Type [employer]    PT Start Time 1308   patient 8 minutes late   PT Stop Time 1346    PT Time Calculation (min) 38 min    Activity Tolerance Patient tolerated treatment well    Behavior During Therapy [facility] for tasks assessed/performed             Past Medical History:  Diagnosis Date   Abdominal pain, epigastric 11/20/2019   Asthma    as a child   Cervical dysplasia    Gastroesophageal reflux disease 11/20/2019   Genetic testing 02/01/2018   Genetic testing was performed in 2015. The [employer] panel was ordered. In total, 23 genes were analyzed as part of this panel: ATM, BARD1, BRCA1, BRCA2, BRIP1, CDH1, CHEK2, EPCAM, MLH1, MRE11A, MSH2, MSH6, MUTYH, NBN, NF1, PALB2, PMS2, PTEN, RAD50, RAD51C, RAD51D, STK11, and TP53. No pathogenic variants were detected. Genetic testing did detect a Variant of Unknown Significance (VUS) at the t   GERD (gastroesophageal reflux disease)    had during chemo   Headache 06/12/2013   History of chemotherapy    History of kidney stones    History of radiation therapy 11/24/13- 01/08/14   reconstructed right breast/chest wall 4500 cGy 25 sessions, electron beam boost 1440 cGy 8 sessions   Liver masses 12/06/2016   Major depressive disorder, in remission 03/12/2017   Malignant neoplasm of lower-inner quadrant of right breast of female, estrogen receptor positive 2012   Pneumonia    PONV (postoperative nausea and vomiting)    Recurrent cancer of right breast 12/22/2013   Thrombocytopenia 05/22/2013   Past Surgical History:  Procedure Laterality Date   ADENOIDECTOMY     BREAST LUMPECTOMY  WITH NEEDLE LOCALIZATION Right 05/05/2013   Procedure: EXCISION RECURRENT CANCER RIGHT BREAST WITH NEEDLE LOCALOZATION;  Surgeon: Adin Hector, MD;  Location: [place];  Service: General;  Laterality: Right;   BREAST RECONSTRUCTION  06/29/2011   Procedure: BREAST RECONSTRUCTION;  Surgeon: Theodoro Kos, DO;  Location: [place];  Service: Plastics;  Laterality: Bilateral;  Immediate Bilateral Breast Reconstruction with Bilateral Tissue Expanders and placement of  Alloderm    DILATION AND CURETTAGE OF UTERUS     fibroid tumor surgery     lazy eye     corrective surgery   MASTECTOMY W/ SENTINEL NODE BIOPSY  06/29/2011   Procedure: MASTECTOMY WITH SENTINEL LYMPH NODE BIOPSY;  Surgeon: Adin Hector, MD;  Location: [place];  Service: General;  Laterality: Right;  right skin spairing mstectomy and right sentinel lymph node biopsy   PORT-A-CATH REMOVAL  03/12/2012   Procedure: MINOR REMOVAL PORT-A-CATH;  Surgeon: Adin Hector, MD;  Location: [place];  Service: General;  Laterality: N/A;  Upper left   PORTACATH PLACEMENT Left 05/05/2013   Procedure: INSERTION PORT-A-CATH;  Surgeon: Adin Hector, MD;  Location: [place];  Service: General;  Laterality: Left;   RHINOPLASTY     uterine cervical treatments  2002   for precancerous lesions   Patient Active Problem List   Diagnosis Date Noted   Positive ANA (antinuclear antibody) 03/18/2021  Left leg pain 03/18/2021   Arthralgia 03/11/2021   Back pain 02/26/2021   Neck pain 02/26/2021   Gastroesophageal reflux disease 11/20/2019   Major depressive disorder, in remission 03/12/2017   Liver masses 12/06/2016   Recurrent cancer of right breast 12/22/2013   Family history of malignant neoplasm of ovary 07/25/2013   Family history of malignant neoplasm of breast 07/25/2013   Family history of malignant neoplasm of gastrointestinal tract 07/25/2013   Headache 06/12/2013   Thrombocytopenia 05/22/2013   Acquired absence of bilateral  breasts and nipples 11/23/2011   Malignant neoplasm of lower-inner quadrant of right breast of female, estrogen receptor positive 2012    PCP: Hoyt Koch, MD REFERRING PROVIDER: Vanetta Mulders, MD    REFERRING DIAG: M54.50,G89.29 (ICD-10-CM) - Chronic low back pain, unspecified back pain laterality, unspecified whether sciatica present M54.2 (ICD-10-CM) - Cervical pain (neck)    THERAPY DIAG:  Difficulty in walking, not elsewhere classified   Cervicalgia   Muscle weakness (generalized)   Pain, lumbar region   PERTINENT HISTORY: h/o breast CA with reconstruction, fat removal from bil thighs, migraines   PRECAUTIONS: none     SUBJECTIVE: Patient reports the area of her knee that she hit a few weeks ago has been sore. She feels like it is swollen. She reports she was up on it a lot over the weekend. She has had pain medial, posterior and up the lateral leg.    PAIN:  Are you having pain? Yes VAS scale: 4/10 Pain location: right knee  Pain orientation: Bilateral  PAIN TYPE: aching and tension Pain description: intermittent  Aggravating factors: sitting unsupported, leaning fwd Relieving factors: lay down       OBJECTIVE:  DIAGNOSTIC FINDINGS:  Cervical & lumbar MRI 10/13   PATIENT SURVEYS:  Oswestry Score: 22 / 50 or 44 % Neck Disability Index score: 20 / 50 = 40.0 %   Lumbar ROM: flex 75% with p! ext 50% p! L rot  75% R rot 75% L SB 50% R SB 50%   C/S ROM: globally WFL throughout ROM due to stiffness and pain, especially with flexion and extension   MMT: UE 4/5 throughout LE 4/5 throughout hips and 4+/5 knees         TODAY'S TREATMENT: 12/12 Bilateral UT stretch 3x20 sec hold Bilateral Levator stretch 2x20 sec hold   Mid thoracic stretch with cuing on positioning 2x20 sec hold Posterior capsule stretching with rotation for peri-scapular area 2x20 sec hold   Piriformis stretch 3x20 sec hold  LTR x20 with cuing for when she is in pain   Quat  set 2x15 with cuing for home  SLR 3x10 with cuing for tightening of the abdominal muscles  Reviewed how to use her program. Reviewed use of stretches vs exercises in her program Reviewed relaxation techniques     12/9   Bilateral ER 3x10 red   Horizontal abduction 3x10 red  Horizontal abduction with flexion red 3x10    Nu-step 5 min  Manual therapy: sub-occipital release Trigger point release to upper traps Gentle cervical traction        12/7 L/S standing extension Review of pacing of activity at home, ergonomic positioning during fwd flexed position Doorway pec stretch review          TPDN with skilled palpation and monitoring,  STM following,  L3-5 paraspinals   11/30    Bridge 2x10  GTB rowing 2x10 Seated pelvic tilting 20x (reviewed for home) T/S open book 5s  10x  Pec stretch 30s 3x   Manual therapy: bilat anterior shoulder/pec deep STM with pin and stretch;  T/S PA grade IV T5-10   11/23   L1-5 CPA and UPA bilat grade IV C/S LAD grade III, subocciptial hold C2-7 UPA bilat grade II STM: bilat L/S paraspinal and R lower T/S paraspinal   Bridge 2x10 Rowing 15x GTB Doorway pec stretch 30s 3x each Curl up 2x10     11/21             L1-5 CPA and UPA bilat grade IV C/S LAD grade II STM: bilat L/S paraspinal and R lower T/S paraspinal Suboccipital release  Child's pose 3s 10x Cat cow 10 each Open book 5s 10x   11/16             Chin tucks 1s 10x (VC and TC needed for acceptable range and technique)             YTB external rotation with scap retraction 2x10           11/14             Cervical rotation SNAG             Active cervical motions/stretches with avoiding extension motion             TPDN with skilled palpation and monitoring (bil upper traps), STM following, traction, gross stretching, lateral mobilizations Rt to Lt                11/10             AquaticREHABdocumentation: Water will provide increased arousal using the property  of surface tension as this patient struggles with lethargy which impairs the cognitive processing., Water will aid with movement using the current and laminar flow while the buoyancy reduces weight bearing, and Hydrostatic pressure also supports joints by unweighting joint load by at least 50 % in 3-4 feet depth water. 80% in chest to neck deep water.             Entered pool via stairs without use of hand rail, depth up to 4', temp 95 deg.              Forward walking with abdominal engagement Manual STM to suboccipitals with mobile floating for stretch and joint mobility; also performed with bilateral QLs             Float planks with belt under pelvis             Knee/elbow planks on steps       PATIENT EDUCATION:  Education details: reviewed how to use her HEP to manage symptoms; reasoning behind knee exercises.  Person educated: Patient Education method: Explanation; handouts  Education comprehension: verbalized understanding     HOME EXERCISE PROGRAM: Access Code: [email or web address] URL: [email or web address] Date: 03/09/2021 Prepared by: Daleen Bo   Exercises Theracane Over Shoulder - 1 x daily - 7 x weekly - 3 sets - 10 reps Sidelying Open Book - 1 x daily - 7 x weekly - 3 sets - 3 breaths hold Neutral Lumbar Spine Curl Up - 1 x daily - 7 x weekly - 1 sets - 10 reps Standing Shoulder Row with Anchored Resistance - 1 x daily - 7 x weekly - 2 sets - 10 reps Shoulder External Rotation and Scapular Retraction with Resistance - 1 x daily - 7 x weekly - 2 sets - 8 reps Supine Bridge - 1 x  daily - 7 x weekly - 2 sets - 10 reps     ASSESSMENT:   CLINICAL IMPRESSION: Patient was concerned about her knee pain. She was afraid it was some type of blood clot or soft tissue injury. It did not have signs of a blood clot. She was given exercises that can be used both for the knee and back. She was also given stretches for the neck/mid thoracic area/ and low back. She was advised if any  of those areas flair up to try the stretches. If they work she was advised to circle stretches that work. Therapy advised her to focus on the exercises that are both for her knee and back but not forget about her other series she has been given. She is still interested in needling. We can try that next visit.   REHAB POTENTIAL: Good   CLINICAL DECISION MAKING: Evolving/moderate complexity   EVALUATION COMPLEXITY: Moderate     GOALS:     SHORT TERM GOALS:   STG Name Target Date Goal status  1 Pt will be able to demo proper resting postural alignment Baseline: still has to correct when she remembers but is able to correct 02/11/2021 achieved  2 Pt will be independent in daily stretching program  Baseline: achieved at this time 02/11/2021 achieved                                                 LONG TERM GOALS:    LTG Name Target Date Goal status  1 ODI to improve my MDC of 12 points Baseline: scored 17 points at eval 03/18/2021 INITIAL  2 NDI to improve by MDC of 10 points Baseline: scored 21 points at eval 03/18/2021 INITIAL  3 Pt will be able to bend from a standing position to reach objects on floor with minimal to no discomfort Baseline:very limited at eval 03/18/2021 INITIAL  4 Pt will be able to stand to wash dishes  Baseline: will work on recognition of necessary postural changes and breaks to improve endurance 03/18/2021 INITIAL  5 Pt will be independent in long term exercise to continue advancing strength Baseline:will progress as appropriate 03/18/2021 INITIAL                      PLAN: PT FREQUENCY: 2x/week   PT DURATION: 6 weeks   PLANNED INTERVENTIONS: Therapeutic exercises, Therapeutic activity, Neuro Muscular re-education, Balance training, Gait training, Patient/Family education, Joint mobilization, Stair training, Aquatic Therapy, Dry Needling, Spinal mobilization, Cryotherapy, Moist heat, and Manual therapy   PLAN FOR NEXT SESSION: anterior chest flexibility,  thigh height deadlifting, segmental flexion, hip hinge/bending tolerance, TPDN PRN; see how she tolerated knee series and stretches. Continue to progress her home exercises. She may benefit from a standing series as time is an issue for her.       Carney Living PT DPT  03/21/2021, 2:54 PM

## 2021-03-23 ENCOUNTER — Encounter (HOSPITAL_BASED_OUTPATIENT_CLINIC_OR_DEPARTMENT_OTHER): Payer: 59 | Admitting: Physical Therapy

## 2021-03-23 ENCOUNTER — Encounter (HOSPITAL_BASED_OUTPATIENT_CLINIC_OR_DEPARTMENT_OTHER): Payer: Self-pay | Admitting: Physical Therapy

## 2021-03-24 ENCOUNTER — Encounter (HOSPITAL_BASED_OUTPATIENT_CLINIC_OR_DEPARTMENT_OTHER): Payer: Self-pay | Admitting: Physical Therapy

## 2021-03-28 ENCOUNTER — Encounter (HOSPITAL_BASED_OUTPATIENT_CLINIC_OR_DEPARTMENT_OTHER): Payer: 59 | Admitting: Physical Therapy

## 2021-03-29 ENCOUNTER — Ambulatory Visit (INDEPENDENT_AMBULATORY_CARE_PROVIDER_SITE_OTHER): Payer: 59 | Admitting: Internal Medicine

## 2021-03-29 ENCOUNTER — Other Ambulatory Visit: Payer: Self-pay

## 2021-03-29 VITALS — BP 103/70 | HR 84 | Temp 97.5°F | Resp 18 | Ht 63.0 in

## 2021-03-29 DIAGNOSIS — U071 COVID-19: Secondary | ICD-10-CM | POA: Diagnosis not present

## 2021-03-29 MED ORDER — ALBUTEROL SULFATE HFA 108 (90 BASE) MCG/ACT IN AERS: 2.0000 | INHALATION_SPRAY | Freq: Four times a day (QID) | RESPIRATORY_TRACT | 0 refills | Status: DC | PRN

## 2021-03-29 NOTE — Patient Instructions (Addendum)
We will check the labs and have sent in an albuterol inhaler.  I would recommend to start taking zyrtec to help with the wheezing you are having and the post nasal drainage.

## 2021-03-29 NOTE — Progress Notes (Signed)
° °  Subjective:   Patient ID: [name], female    DOB: September 08, 1970, 50 y.o.   MRN: [id number]  HPI The patient is a 50 YO female coming in for covid-19 positive.   Review of Systems  Constitutional:  Positive for activity change and appetite change. Negative for chills, fatigue, fever and unexpected weight change.  HENT:  Positive for congestion, postnasal drip and rhinorrhea. Negative for ear discharge, ear pain, sinus pressure, sinus pain, sneezing, sore throat, tinnitus, trouble swallowing and voice change.   Eyes: Negative.   Respiratory:  Positive for cough. Negative for chest tightness, shortness of breath and wheezing.   Cardiovascular: Negative.   Gastrointestinal: Negative.   Musculoskeletal:  Positive for myalgias.  Neurological: Negative.    Objective:  Physical Exam Constitutional:      Appearance: She is well-developed.  HENT:     Head: Normocephalic and atraumatic.     Comments: Oropharynx with redness and clear drainage, nose with swollen turbinates, TMs normal bilaterally.  Neck:     Thyroid: No thyromegaly.  Cardiovascular:     Rate and Rhythm: Normal rate and regular rhythm.  Pulmonary:     Effort: Pulmonary effort is normal. No respiratory distress.     Breath sounds: Normal breath sounds. No wheezing or rales.  Abdominal:     General: Bowel sounds are normal. There is no distension.     Palpations: Abdomen is soft.     Tenderness: There is no abdominal tenderness. There is no rebound.  Musculoskeletal:        General: No tenderness.     Cervical back: Normal range of motion.  Lymphadenopathy:     Cervical: No cervical adenopathy.  Skin:    General: Skin is warm and dry.  Neurological:     Mental Status: She is alert and oriented to person, place, and time.     Coordination: Coordination normal.    Vitals:   03/29/21 1433  BP: 103/70  Pulse: 84  Resp: 18  Temp: (!) 97.5 F (36.4 C)  TempSrc: Oral  SpO2: 98%  Height: 5\' 3"  (1.6 m)     This visit occurred during the SARS-CoV-2 public health emergency.  Safety protocols were in place, including screening questions prior to the visit, additional usage of staff PPE, and extensive cleaning of exam room while observing appropriate contact time as indicated for disinfecting solutions.   Assessment & Plan:

## 2021-03-30 ENCOUNTER — Encounter: Payer: Self-pay | Admitting: Internal Medicine

## 2021-03-30 ENCOUNTER — Encounter (HOSPITAL_BASED_OUTPATIENT_CLINIC_OR_DEPARTMENT_OTHER): Payer: 59 | Admitting: Physical Therapy

## 2021-03-30 DIAGNOSIS — U071 COVID-19: Secondary | ICD-10-CM | POA: Insufficient documentation

## 2021-03-30 LAB — D-DIMER, QUANTITATIVE: D-Dimer, Quant: <0.19 mcg/mL FEU (ref <0.50)

## 2021-03-30 NOTE — Assessment & Plan Note (Signed)
Has taken paxlovid and completed. Appears to be recovering well from covid-19. Rx albuterol in case needed and advised to start zyrtec for congestion. She is requesting prednisone however no indication for this and no wheezing on exam. She requests d-dimer and [place] leg. She had injury weeks prior to covid-19 and she is concerned. D-dimer ordered no signs of DVT on exam.

## 2021-04-01 NOTE — Progress Notes (Deleted)
Benito Mccreedy D.[place] Phone: [phone]   Assessment and Plan:     There are no diagnoses linked to this encounter.  *** - Patient has received significant relief with OMT in the past.  Elects for repeat OMT today.  Tolerated well per note below. - Decision today to treat with OMT was based on Physical Exam   After verbal consent patient was treated with HVLA (high velocity low amplitude), ME (muscle energy), FPR (flex positional release), ST (soft tissue), PC/PD (Pelvic Compression/ Pelvic Decompression) techniques in cervical, rib, thoracic, lumbar, and pelvic areas. Patient tolerated the procedure well with improvement in symptoms.  Patient educated on potential side effects of soreness and recommended to rest, hydrate, and use Tylenol as needed for pain control.   Pertinent previous records reviewed include ***   Follow Up: ***     Subjective:   I, Moenique Parris, am serving as a Education administrator for Doctor Glennon Mac  Chief Complaint: chronic bilateral back pain   HPI:   02/04/21 Patient is a 50 year old female presenting with Neck pain and was referred to sports medicine for evaluation and treatment. Patient sees neurology for the headaches and neck pain, was last seen on 02/01/21 by Dr. Tomi Likens for possible OMT. Today patient states LBP started first. 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 Headaches mild to severe and constant and located in the back. Dull constant achy pressure in neck. PT just started this week. Pain down left arm numbness and tingling. LBP spreads over back. Dull achy and pressure. Numbness and tingling doing down both legs. Lipedema in both legs   02/10/21 Patient states whole back has been hurting over the past 2 days. Did start PT and feels like everything is moving in a positive direction. No new complaints.   02/21/21 Patient states that her neck and  head are hurting and took ibuprofen and Excedrin to help. Patient also had PT and had dry needling today.    03/07/21 Patient states that her back is not doing well. Patient had decorated for Christmas putting up her tree so she was in a lot of weird positions and she started to get the low back pain that radiated down both legs. Patient states that the pain hurt so bad she took an old tramadol Rx she had. This issue started last night.    03/21/2021  Patient states the neck is tight right in the middle today, has some discomfort since the other day and over the weekend   04/05/2021  Patient states    Relevant Historical Information:  History of breast cancer, ovarian cancer, spinal stenosis  Relevant Historical Information: ***  Additional pertinent review of systems negative.  Current Outpatient Medications  Medication Sig Dispense Refill   albuterol (VENTOLIN HFA) 108 (90 Base) MCG/ACT inhaler Inhale 2 puffs into the lungs every 6 (six) hours as needed for wheezing or shortness of breath. 8 g 0   aspirin-acetaminophen-caffeine (EXCEDRIN MIGRAINE) 485-462-70 MG tablet Take by mouth every 6 (six) hours as needed for headache.     Biotin 5000 MCG CAPS Take 5,000 mcg by mouth daily.     Black Pepper-Turmeric (TURMERIC CURCUMIN) 08-998 MG CAPS Take by mouth 2 (two) times daily.     celecoxib (CELEBREX) 200 MG capsule Take 1 capsule (200 mg total) by mouth 2 (two) times daily. 30 capsule 3   Cholecalciferol (VITAMIN D3)  125 MCG (5000 UT) TBDP Take 1 tablet by mouth daily.     Coenzyme Q10 (COQ10) 150 MG CAPS Take 1 capsule by mouth once.     cyclobenzaprine (FLEXERIL) 10 MG tablet Take 1 tablet (10 mg total) by mouth every 8 (eight) hours as needed for muscle spasms. 30 tablet 2   DULoxetine (CYMBALTA) 30 MG capsule Take 30 mg by mouth daily.     finasteride (PROSCAR) 5 MG tablet Take 5 mg by mouth daily. 1/2 tablet daily     ibuprofen (ADVIL) 800 MG tablet Take 1 tablet (800 mg total) by  mouth every 8 (eight) hours as needed. 30 tablet 5   lidocaine (LIDODERM) 5 % Place 1 patch onto the skin daily. Remove & Discard patch within 12 hours or as directed by MD 30 patch 0   lidocaine-prilocaine (EMLA) cream Apply 1 application topically as needed. 100 g 3   methocarbamol (ROBAXIN) 500 MG tablet Take 1 tablet (500 mg total) by mouth 3 (three) times daily. 90 tablet 0   pantoprazole (PROTONIX) 40 MG tablet Take 1 tablet (40 mg total) by mouth daily. 30 tablet 11   Prenat-Fe Poly-Methfol-FA-DHA (VITAFOL FE+) 90-0.6-0.4-200 MG CAPS Take 1 tablet by mouth daily. 30 capsule 11   sucralfate (CARAFATE) 1 g tablet Take 1 tablet (1 g total) by mouth 4 (four) times daily -  with meals and at bedtime. 120 tablet 1   Ubrogepant (UBRELVY) 100 MG TABS Take 1 tablet by mouth as needed (May repeat in 2 hours.  maximum 2 tablets in 24 hours.). 16 tablet 5   No current facility-administered medications for this visit.      Objective:     There were no vitals filed for this visit.    There is no height or weight on file to calculate BMI.    Physical Exam:     General: Well-appearing, cooperative, sitting comfortably in no acute distress.   OMT Physical Exam:  ASIS Compression Test: Positive Right Cervical: TTP paraspinal, *** Rib: Bilateral elevated first rib with TTP Thoracic: TTP paraspinal,*** Lumbar: TTP paraspinal,*** Pelvis: Right anterior innominate  Electronically signed by:  Benito Mccreedy D.[facility] 10:56 AM 04/01/21

## 2021-04-05 ENCOUNTER — Ambulatory Visit (HOSPITAL_BASED_OUTPATIENT_CLINIC_OR_DEPARTMENT_OTHER): Payer: 59 | Admitting: Physical Therapy

## 2021-04-05 ENCOUNTER — Ambulatory Visit: Payer: 59 | Admitting: Sports Medicine

## 2021-04-05 ENCOUNTER — Other Ambulatory Visit: Payer: Self-pay | Admitting: Gastroenterology

## 2021-04-07 ENCOUNTER — Encounter (HOSPITAL_BASED_OUTPATIENT_CLINIC_OR_DEPARTMENT_OTHER): Payer: 59 | Admitting: Physical Therapy

## 2021-04-18 ENCOUNTER — Ambulatory Visit: Payer: 59 | Admitting: Sports Medicine

## 2021-04-26 ENCOUNTER — Telehealth: Payer: Self-pay | Admitting: Internal Medicine

## 2021-04-26 NOTE — Telephone Encounter (Signed)
Visit on 12/20 was not related to pain. I will need more information about how she is using hydrocodone (I.e. how often she is using, how well it is working) in order to decide how to proceed as she was to follow up with [place] within 1-2 weeks after rx to let us know about effectiveness.

## 2021-04-26 NOTE — Telephone Encounter (Signed)
Patient states she was prescribed hydrocodone at 03-09-2021 ov due to back pain  Patient states provider advised her if medication helped, provider would prescribe medication again  Patient states she is still having back pain is and requesting a rx for hydrocodone  Patient was last seen 03-29-2021  Pharmacy [facility], [place] - [address]

## 2021-05-03 NOTE — Telephone Encounter (Signed)
See my chart message

## 2021-05-17 ENCOUNTER — Ambulatory Visit: Payer: Self-pay | Admitting: Internal Medicine

## 2021-05-20 ENCOUNTER — Ambulatory Visit (INDEPENDENT_AMBULATORY_CARE_PROVIDER_SITE_OTHER): Payer: Managed Care, Other (non HMO) | Admitting: Internal Medicine

## 2021-05-20 ENCOUNTER — Other Ambulatory Visit: Payer: Self-pay

## 2021-05-20 ENCOUNTER — Encounter: Payer: Self-pay | Admitting: Internal Medicine

## 2021-05-20 DIAGNOSIS — M549 Dorsalgia, unspecified: Secondary | ICD-10-CM

## 2021-05-20 DIAGNOSIS — G8929 Other chronic pain: Secondary | ICD-10-CM

## 2021-05-20 MED ORDER — HYDROCODONE-ACETAMINOPHEN 5-325 MG PO TABS: 1.0000 | ORAL_TABLET | Freq: Every day | ORAL | 0 refills | Status: DC | PRN

## 2021-05-20 MED ORDER — DOXYCYCLINE HYCLATE 100 MG PO TABS: 100.0000 mg | ORAL_TABLET | Freq: Two times a day (BID) | ORAL | 0 refills | Status: DC

## 2021-05-20 NOTE — Progress Notes (Signed)
° °  Subjective:   Patient ID: [name], female    DOB: [birth year], 51 y.o.   MRN: [id number]  HPI The patient is a 51 YO female coming in for follow up of her back.   Review of Systems  Constitutional:  Positive for activity change. Negative for appetite change, chills, fatigue, fever and unexpected weight change.  Respiratory: Negative.    Cardiovascular: Negative.   Gastrointestinal: Negative.   Musculoskeletal:  Positive for arthralgias, back pain and myalgias. Negative for gait problem and joint swelling.  Skin: Negative.   Neurological: Negative.    Objective:  Physical Exam Constitutional:      Appearance: She is well-developed.  HENT:     Head: Normocephalic and atraumatic.  Cardiovascular:     Rate and Rhythm: Normal rate and regular rhythm.  Pulmonary:     Effort: Pulmonary effort is normal. No respiratory distress.     Breath sounds: Normal breath sounds. No wheezing or rales.  Abdominal:     General: Bowel sounds are normal. There is no distension.     Palpations: Abdomen is soft.     Tenderness: There is no abdominal tenderness. There is no rebound.  Musculoskeletal:     Cervical back: Normal range of motion.  Skin:    General: Skin is warm and dry.  Neurological:     Mental Status: She is alert and oriented to person, place, and time.     Coordination: Coordination normal.    Vitals:   05/20/21 1039  BP: 118/74  Pulse: 65  Resp: 18  SpO2: 97%  Height: 5\' 3"  (1.6 m)    This visit occurred during the SARS-CoV-2 public health emergency.  Safety protocols were in place, including screening questions prior to the visit, additional usage of staff PPE, and extensive cleaning of exam room while observing appropriate contact time as indicated for disinfecting solutions.   Assessment & Plan:

## 2021-05-20 NOTE — Patient Instructions (Addendum)
We have refilled the hydrocodone to use as needed for the back.  Check out the book come as you are

## 2021-05-20 NOTE — Assessment & Plan Note (Signed)
Using rarely and effective. Wishes to continue with hydrocodone 5/325 mg which is prescribed daily prn. #30 no refills and reviewed [place] narcotic database.

## 2021-05-24 ENCOUNTER — Telehealth: Payer: Self-pay

## 2021-05-24 NOTE — Telephone Encounter (Signed)
See below

## 2021-05-24 NOTE — Telephone Encounter (Signed)
Pharmacy calling on behave of Pt. Pharmacy was only able to fill for 5 days worth which was 5 tablets at 1 a day.  Pharmacy is saying to send a different script for the remainder of the previous Rx.  The reason is pt is Opioid naive and could only have 5 days worth initially but can fill the remainder of the 25 days on the Rx with another Rx.  CB [phone]

## 2021-05-25 ENCOUNTER — Encounter: Payer: Self-pay | Admitting: Internal Medicine

## 2021-05-26 NOTE — Telephone Encounter (Signed)
See my chart message

## 2021-05-26 NOTE — Telephone Encounter (Signed)
Pt is calling to check the status of the refill for the remainder part of the Rx wrote  on 2/10 for Hydrocodone-acetaminophen 5-325.   Please advise [phone]

## 2021-05-27 MED ORDER — HYDROCODONE-ACETAMINOPHEN 5-325 MG PO TABS: 1.0000 | ORAL_TABLET | Freq: Every day | ORAL | 0 refills | Status: DC | PRN

## 2021-05-27 NOTE — Telephone Encounter (Signed)
Sent in

## 2021-05-27 NOTE — Addendum Note (Signed)
Addended by: Pricilla Holm A on: 05/27/2021 09:52 AM   Modules accepted: Orders

## 2021-06-06 ENCOUNTER — Ambulatory Visit: Payer: 59 | Admitting: Internal Medicine

## 2021-06-21 ENCOUNTER — Institutional Professional Consult (permissible substitution): Payer: Self-pay | Admitting: Pulmonary Disease

## 2021-06-29 ENCOUNTER — Telehealth: Payer: Self-pay

## 2021-06-29 NOTE — Telephone Encounter (Signed)
Pt is requesting a refill on: ?HYDROcodone-acetaminophen (NORCO/VICODIN) 5-325 MG tablet ? ?Pharmacy: ?[facility] [place] ? ?LOV 05/20/21 ?ROV 07/05/21 ?

## 2021-06-30 MED ORDER — HYDROCODONE-ACETAMINOPHEN 5-325 MG PO TABS: 1.0000 | ORAL_TABLET | Freq: Every day | ORAL | 0 refills | Status: DC | PRN

## 2021-07-05 ENCOUNTER — Ambulatory Visit (INDEPENDENT_AMBULATORY_CARE_PROVIDER_SITE_OTHER): Payer: Managed Care, Other (non HMO) | Admitting: Internal Medicine

## 2021-07-05 ENCOUNTER — Institutional Professional Consult (permissible substitution): Payer: Self-pay | Admitting: Pulmonary Disease

## 2021-07-05 ENCOUNTER — Encounter: Payer: Self-pay | Admitting: Internal Medicine

## 2021-07-05 DIAGNOSIS — Z0000 Encounter for general adult medical examination without abnormal findings: Secondary | ICD-10-CM

## 2021-07-05 NOTE — Progress Notes (Signed)
? ?  Subjective:  ? ?Patient ID: [name], female    DOB: 1970-06-29, 51 y.o.   MRN: [id number] ? ?HPI ?The patient is a 51 YO female coming in for physical ? ?PMH, [facility], social history reviewed and updated ? ?Review of Systems  ?Constitutional: Negative.   ?HENT: Negative.    ?Eyes: Negative.   ?Respiratory:  Negative for cough, chest tightness and shortness of breath.   ?Cardiovascular:  Negative for chest pain, palpitations and leg swelling.  ?Gastrointestinal:  Negative for abdominal distention, abdominal pain, constipation, diarrhea, nausea and vomiting.  ?Musculoskeletal:  Positive for back pain.  ?Skin: Negative.   ?Neurological: Negative.   ?Psychiatric/Behavioral: Negative.    ? ?Objective:  ?Physical Exam ?Constitutional:   ?   Appearance: She is well-developed.  ?HENT:  ?   Head: Normocephalic and atraumatic.  ?Cardiovascular:  ?   Rate and Rhythm: Normal rate and regular rhythm.  ?Pulmonary:  ?   Effort: Pulmonary effort is normal. No respiratory distress.  ?   Breath sounds: Normal breath sounds. No wheezing or rales.  ?Abdominal:  ?   General: Bowel sounds are normal. There is no distension.  ?   Palpations: Abdomen is soft.  ?   Tenderness: There is no abdominal tenderness. There is no rebound.  ?Musculoskeletal:     ?   General: Tenderness present.  ?   Cervical back: Normal range of motion.  ?Skin: ?   General: Skin is warm and dry.  ?Neurological:  ?   Mental Status: She is alert and oriented to person, place, and time.  ?   Coordination: Coordination normal.  ? ? ?Vitals:  ? 07/05/21 1323  ?BP: 114/64  ?Pulse: 82  ?Resp: 18  ?SpO2: 98%  ?Height: '5\' 3"'$  (1.6 m)  ? ? ?This visit occurred during the SARS-CoV-2 public health emergency.  Safety protocols were in place, including screening questions prior to the visit, additional usage of staff PPE, and extensive cleaning of exam room while observing appropriate contact time as indicated for disinfecting solutions.  ? ?Assessment & Plan:  ? ?

## 2021-07-05 NOTE — Patient Instructions (Addendum)
[facility] attention specialist for the attention. Their number is [phone] ? ?We could try the tramadol for pain instead if you want to try something without the tylenol in it. ? ?You are due for the tetanus vaccine and the shingles vaccine. ? ?Meloxicam and diclofenac are the other options for pain which you have not tried. ? ? ?

## 2021-07-07 ENCOUNTER — Telehealth: Payer: Self-pay | Admitting: Adult Health

## 2021-07-07 NOTE — Telephone Encounter (Signed)
Rescheduled appointment per provider template. Patient is aware of the changes made to her upcoming appointment 

## 2021-07-08 DIAGNOSIS — Z0000 Encounter for general adult medical examination without abnormal findings: Secondary | ICD-10-CM | POA: Insufficient documentation

## 2021-07-08 NOTE — Assessment & Plan Note (Signed)
Flu shot declines. Covid-19 declines. Pneumonia declines. Shingrix decilnes. Tetanus declines. Colonoscopy up to date. Mammogram not indicated, pap smear up to date. Counseled about sun safety and mole surveillance. Counseled about the dangers of distracted driving. Given 10 year screening recommendations.  ? ?

## 2021-07-22 ENCOUNTER — Telehealth: Payer: Self-pay | Admitting: Pulmonary Disease

## 2021-07-22 NOTE — Telephone Encounter (Signed)
Patient was referred to our office for excessive daytime sleepiness. Called patient but she did not answer. Left detailed message for her to call back and get rescheduled with one of the sleep doctors.  ?

## 2021-07-23 NOTE — Progress Notes (Signed)
07/25/21- 53 yoF former smoker for sleep evaluation courtesy of Dr Metta Clines with  concern of EDS, Migraine, Cervicalgia ?Medical problem list includes Asthma, GERD, Thrombocytopenia, Hx Covid infection, Breast Cancer/ Bilateral Mastectomy/ XRT/ Reconstruction, Depression,  ?Epworth score-12 ?Body weight today-130 lbs ?Covid vax-no ?Flu vax-no ?-----Patient has daytime sleepiness. Patient states that she has woke herself up gasping for air. Has headaches in the morning when she wakes up. States sometimes she wakes up at night and sometimes not. Mid morning-mid afternoon she feels sleepy and has been falling asleep driving.  ?She reports awareness of daytime sleepiness perhaps since grade school.  Husband has told her of snoring and some jerking in her sleep but no complex parasomnias.  She denies cataplexy or sleep paralysis.  No sleep medicines.  Takes occasional Excedrin with caffeine but otherwise no routine coffee or tea.  Denies family history of sleep problems.  No history of thyroid disease or seizure disorder. ?ENT surgery for adenoids and sinus surgery.  No heart or lung problems. ?Bedtime usually between 10 and 11 PM up between 615 and 6:30 AM.  Fights need for nap most days.  Concerned about potential for sleepiness while driving. ?Occasional lightheaded orthostasis if standing suddenly. ? ?Prior to Admission medications   ?Medication Sig Start Date End Date Taking? Authorizing Provider  ?aspirin-acetaminophen-caffeine (EXCEDRIN MIGRAINE) 250-250-65 MG tablet Take by mouth every 6 (six) hours as needed for headache.   Yes [provider]  ?Biotin 5000 MCG CAPS Take 5,000 mcg by mouth daily.   Yes [provider]  ?Black Pepper-Turmeric (TURMERIC CURCUMIN) 08-998 MG CAPS Take by mouth 2 (two) times daily.   Yes [provider]  ?Cholecalciferol (VITAMIN D3) 125 MCG (5000 UT) TBDP Take 1 tablet by mouth daily.   Yes [provider]  ?Coenzyme Q10 (COQ10) 150 MG CAPS Take  1 capsule by mouth once.   Yes [provider]  ?finasteride (PROSCAR) 5 MG tablet Take 5 mg by mouth daily. 1/2 tablet daily   Yes [provider]  ?lidocaine (LIDODERM) 5 % Place 1 patch onto the skin daily. Remove & Discard patch within 12 hours or as directed by MD 03/09/21  Yes Hoyt Koch, MD  ?lidocaine-prilocaine (EMLA) cream Apply 1 application topically as needed. 03/18/21  Yes Hoyt Koch, MD  ?Prenat-Fe Poly-Methfol-FA-DHA (VITAFOL FE+) 90-0.6-0.4-200 MG CAPS Take 1 tablet by mouth daily. 11/06/18  Yes Shelly Bombard, MD  ?Ubrogepant (UBRELVY) 100 MG TABS Take 1 tablet by mouth as needed (May repeat in 2 hours.  maximum 2 tablets in 24 hours.). 02/01/21  Yes Pieter Partridge, DO  ? ?Past Medical History:  ?Diagnosis Date  ? Abdominal pain, epigastric 11/20/2019  ? Asthma   ? as a child  ? Cervical dysplasia   ? Gastroesophageal reflux disease 11/20/2019  ? Genetic testing 02/01/2018  ? Genetic testing was performed in 2015. The [employer] OvaNext panel was ordered. In total, 23 genes were analyzed as part of this panel: ATM, BARD1, BRCA1, BRCA2, BRIP1, CDH1, CHEK2, EPCAM, MLH1, MRE11A, MSH2, MSH6, MUTYH, NBN, NF1, PALB2, PMS2, PTEN, RAD50, RAD51C, RAD51D, STK11, and TP53. No pathogenic variants were detected. Genetic testing did detect a Variant of Unknown Significance (VUS) at the t  ? GERD (gastroesophageal reflux disease)   ? had during chemo  ? Headache 06/12/2013  ? History of chemotherapy   ? History of kidney stones   ? History of radiation therapy 11/24/13- 01/08/14  ? reconstructed right breast/chest wall 4500  cGy 25 sessions, electron beam boost 1440 cGy 8 sessions  ? Liver masses 12/06/2016  ? Major depressive disorder, in remission 03/12/2017  ? Malignant neoplasm of lower-inner quadrant of right breast of female, estrogen receptor positive 2012  ? Pneumonia   ? PONV (postoperative nausea and vomiting)   ? Recurrent cancer of right breast 12/22/2013  ? Thrombocytopenia  05/22/2013  ? ?Past Surgical History:  ?Procedure Laterality Date  ? ADENOIDECTOMY    ? BREAST LUMPECTOMY WITH NEEDLE LOCALIZATION Right 05/05/2013  ? Procedure: EXCISION RECURRENT CANCER RIGHT BREAST WITH NEEDLE LOCALOZATION;  Surgeon: Adin Hector, MD;  Location: [facility];  Service: General;  Laterality: Right;  ? BREAST RECONSTRUCTION  06/29/2011  ? Procedure: BREAST RECONSTRUCTION;  Surgeon: Theodoro Kos, DO;  Location: [facility];  Service: Plastics;  Laterality: Bilateral;  Immediate Bilateral Breast Reconstruction with Bilateral Tissue Expanders and placement of  Alloderm   ? DILATION AND CURETTAGE OF UTERUS    ? fibroid tumor surgery    ? lazy eye    ? corrective surgery  ? MASTECTOMY W/ SENTINEL NODE BIOPSY  06/29/2011  ? Procedure: MASTECTOMY WITH SENTINEL LYMPH NODE BIOPSY;  Surgeon: Adin Hector, MD;  Location: [employer] [facility];  Service: General;  Laterality: Right;  right skin spairing mstectomy and right sentinel lymph node biopsy  ? PORT-A-CATH REMOVAL  03/12/2012  ? Procedure: MINOR REMOVAL PORT-A-CATH;  Surgeon: Adin Hector, MD;  Location: [facility];  Service: General;  Laterality: N/A;  Upper left  ? PORTACATH PLACEMENT Left 05/05/2013  ? Procedure: INSERTION PORT-A-CATH;  Surgeon: Adin Hector, MD;  Location: [place];  Service: General;  Laterality: Left;  ? RHINOPLASTY    ? uterine cervical treatments  2002  ? for precancerous lesions  ? ?Family History  ?Adopted: Yes  ?Problem Relation Age of Onset  ? Heart attack Father   ? Cancer Cousin 81  ?     melanoma; mat first cousin, located on neck  ? Colon cancer Mother 88  ? Rectal cancer Mother   ? Cancer Maternal Aunt 60  ?     stomach and ovarian as separate primaries  ? Cancer Maternal Uncle 10  ?     lung; smoker  ? Cancer Maternal Uncle 62  ?     prostate  ? Cancer Maternal Grandmother 68  ?     enodometrial  ? Cancer Maternal Aunt 55  ?     breast  ? Stomach cancer Maternal Aunt   ? ?Social History  ? ?Socioeconomic History  ? Marital  status: Married  ?  Spouse name: Not on file  ? Number of children: Not on file  ? Years of education: 10  ? Highest education level: GED or equivalent  ?Occupational History  ? Not on file  ?Tobacco Use  ? Smoking status: Former  ?  Types: Cigarettes  ?  Quit date: 07/20/1996  ?  Years since quitting: 25.0  ? Smokeless tobacco: Never  ?Vaping Use  ? Vaping Use: Never used  ?Substance and Sexual Activity  ? Alcohol use: Not Currently  ?  Alcohol/week: 0.0 standard drinks  ? Drug use: No  ? Sexual activity: Yes  ?  Partners: Male  ?Other Topics Concern  ? Not on file  ?Social History Narrative  ? She was adopted by her grandparents as a child.  She is married with a Jerett Odonohue son and an adult Psychiatrist.  She works in a Engineer, agricultural.right handed  ?  Two story home   ? Drinks caffeine occasional  ? Right handed  ? ?Social Determinants of Health  ? ?Financial Resource Strain: Not on file  ?Food Insecurity: Not on file  ?Transportation Needs: Not on file  ?Physical Activity: Not on file  ?Stress: Not on file  ?Social Connections: Not on file  ?Intimate Partner Violence: Not on file  ? ?ROS-see HPI   + = positive ?Constitutional:    weight loss, night sweats, fevers, chills, +fatigue, lassitude. ?HEENT:    headaches, difficulty swallowing, tooth/dental problems, sore throat,  ?     sneezing, itching, ear ache, nasal congestion, post nasal drip, snoring ?CV:    chest pain, orthopnea, PND, swelling in lower extremities, anasarca,                                  dizziness, palpitations ?Resp:   shortness of breath with exertion or at rest.   ?             productive cough,   non-productive cough, coughing up of blood.   ?           change in color of mucus.  wheezing.   ?Skin:    rash or lesions. ?GI:  No-   heartburn, indigestion, abdominal pain, nausea, vomiting, diarrhea,  ?               change in bowel habits, loss of appetite ?GU: dysuria, change in color of urine, no urgency or frequency.   flank pain. ?MS:    joint pain, stiffness, decreased range of motion, back pain. ?Neuro-     nothing unusual ?Psych:  change in mood or affect.  depression or anxiety.   memory loss. ? ?OBJ- Physical Exam ?General- [place]

## 2021-07-25 ENCOUNTER — Institutional Professional Consult (permissible substitution): Payer: Self-pay | Admitting: Pulmonary Disease

## 2021-07-25 ENCOUNTER — Ambulatory Visit (INDEPENDENT_AMBULATORY_CARE_PROVIDER_SITE_OTHER): Payer: Managed Care, Other (non HMO) | Admitting: Internal Medicine

## 2021-07-25 ENCOUNTER — Encounter: Payer: Self-pay | Admitting: Internal Medicine

## 2021-07-25 VITALS — BP 100/62 | HR 73 | Temp 98.4°F | Ht 63.0 in | Wt 130.0 lb

## 2021-07-25 DIAGNOSIS — R0683 Snoring: Secondary | ICD-10-CM | POA: Diagnosis not present

## 2021-07-25 DIAGNOSIS — G4719 Other hypersomnia: Secondary | ICD-10-CM

## 2021-07-25 NOTE — Assessment & Plan Note (Signed)
Initial question is whether she may have significant obstructive sleep apnea.  Appropriate discussion done and questions answered. ?Plan-schedule sleep study ?

## 2021-07-25 NOTE — Assessment & Plan Note (Signed)
She seems to be getting enough sleep at night and this is a longstanding complaint by her description. ?Plan-sleep study to exclude sleep apnea.  She may then need formal NPSG/MSLT to evaluate for primary disorder of excessive somnolence. ?

## 2021-07-25 NOTE — Patient Instructions (Signed)
Order- schedule home sleep test  dx Snoring ? ?Please call us about 2 weeks after your sleep test, for results and recommendations ?

## 2021-07-27 ENCOUNTER — Telehealth: Payer: Self-pay | Admitting: Pharmacy Technician

## 2021-07-27 ENCOUNTER — Other Ambulatory Visit (HOSPITAL_COMMUNITY): Payer: Self-pay

## 2021-07-27 NOTE — Telephone Encounter (Signed)
Patient Advocate Encounter ? ?Received notification from [employer] that RENEWAL prior authorization for UBRELVY '100MG'$  is required. ?  ?PA submitted on 4.19.23 ?Key [id number] ?Status is pending ?  ?[facility] will continue to follow ? ?Akacia Boltz R Dolly Harbach, CPhT ?Patient Advocate ?Phone: [phone] ?Fax:  [phone] ? ?

## 2021-07-28 ENCOUNTER — Other Ambulatory Visit (HOSPITAL_COMMUNITY): Payer: Self-pay

## 2021-07-28 NOTE — Telephone Encounter (Signed)
Patient Advocate Encounter ? ?Prior Authorization for Ubrelvy '100mg'$  tabs has been approved.   ? ?PA# [employer] ? ?Effective dates: 07/28/21 through 07/28/22 ? ?Per Test Claim Patients co-pay is $0.  ? ?Spoke with Pharmacy to Process. ? ?Patient Advocate ?Fax: [phone]  ?

## 2021-08-18 ENCOUNTER — Other Ambulatory Visit (INDEPENDENT_AMBULATORY_CARE_PROVIDER_SITE_OTHER): Payer: Commercial Managed Care - HMO

## 2021-08-18 DIAGNOSIS — M2550 Pain in unspecified joint: Secondary | ICD-10-CM | POA: Diagnosis not present

## 2021-08-18 LAB — CORTISOL: Cortisol, Plasma: 9.1 ug/dL

## 2021-08-18 NOTE — Progress Notes (Signed)
? ?NEUROLOGY FOLLOW UP OFFICE NOTE ? ?[name] ?[id number] ? ?Assessment/Plan:  ? ?1  Migraine without aura, without status migrainosus, not intractable - probably cervicogenic ?2  Cervical radiculopathy ?3  Low back pain with bilateral radiculopathy ?  ?To address headache and radicular pain, start nortriptyline 24m at bedtime.  We can increase to 282mat bedtime in 4 weeks if needed. ?Migraine rescue:  Ubrelvy 10070mRefer to sleep medicine for evaluation of OSA. ?Limit use of pain relievers to no more than 2 days out of week to prevent risk of rebound or medication-overuse headache. ?Keep headache diary ?Follow up 4-5 months. ?  ?  ?Subjective:  ?SerRenesmae [name] a 50 14ar old right-handed female with history of breast cancer s/p bilateral mastectomies, radiation and chemotherapy who follows up for migraines and memory deficits. ?  ?UPDATE: ?Referred to Sports Medicine for neck pain/radiculopathy where she was treated with OMT.  She had to stop going in December because her son fractured his tibia and she had to take him around for appointments. ? ?Referred to sleep medicine.  She saw Dr. YouAnnamaria Bootsst month.  Sleep study is ordered. ?  ?Intensity:  Severe ?Duration:  2 hours with UbrRoselyn Meierrequency:  5-6 in last 30 days ? ?  ?Current NSAIDS:  ibuprofen 800m3melebrex, Tylenol #3, Excedrin (rare) ?Current analgesics:  none ?Current triptans:  rizatriptan 10mg58mrrent ergotamine:  none ?Current anti-emetic:  none ?Current muscle relaxants:  Robaxin 500mg 44m Flexeril 10mg Q70mRN ?Current anti-anxiolytic:  none ?Current sleep aide:  none ?Current Antihypertensive medications:  none ?Current Antidepressant medications:  none ?Current Anticonvulsant medications:  none ?Current anti-CGRP:  Ubrelvy 100mg ?C33mnt Vitamins/Herbal/Supplements:  Co-Q10 150mg dai30mD, biotin, turmeric curcumin ?Current Antihistamines/Decongestants:  none ?Other therapy:  Ice pack on back of neck and over eyes. ?Hormone/birth  control:  Vitafol Fe ?  ?Caffeine:  No coffee. Mt Dew raBelleair BluffsDiet:  Drinks plenty of water.  Not a big eater.  Eats protein bars, fruits and vegetables.  Sometimes a doughnut. ?Exercise:  Active but not routine ?Mood:  Does not feel depressed but not as happy as she used to be. ?Other pain:  Low back pain sometimes radiating down either/both legs.  Has seen orthopedics and pain management.   ?Sleep hygiene:  She thinks she sleeps well but has excessive daytime sleepiness.  Her husband says she snores.  A few times, she wakes up gasping for air.  She was referred to sleep medicine but nobody contacted her.   ?  ?HISTORY:  ?She has had headaches since her early 40s.   ? 38she has history of recurrent breast cancer (2012 and 2015) status post bilateral mastectomies, radiating and chemotherapy.  She is currently in remission without evidence of disease.  She developed severe headaches during her treatment in 2015.  She has had headaches since then.  Often, she wakes up with pressure-like headaches (pulsating in temples).  Sometimes they may occur later in the day.  Sometimes they are moderate if they occur later in the day, but otherwise severe, either at base of her skull radiating up to the front and in the temples.  No neck pain.  She has associated photophobia, phonophobia, rarely nausea, no associated visual disturbance.  They last usually a couple of hours but may last 1/2 a day.  Severe headaches occur about 10 days a months.  She takes Excedrin daily, which helps.  No specific trigger.  She does report degenerative disc disease  in her cervical spine.  Changing pillows ineffective.  MRI of brain with and without contrast on 03/29/2016 personally reviewed and was unremarkable.  CT soft tissue of neck from 12/29/15 showed C6-7 disc degeneration with severe left neural foraminal stenosis.  She has episodes of dizziness when she is bent over and then stands up.  She also reports short term memory problems.  She  reports daytime fatigue and endorses that she is a "mouth breather".  TSH was 0.952.  MRI of brain with and without contrast on 12/22/2019 was normal.  Neuropsychological testing in October 2021 demonstrated no cognitive deficits.  She began experiencing pain and numbness into the left shoulder, arm and hand.  Repeat MRI of cervical spine on 01/23/2021 personally reviewed showed leftward disc osteophyte complex and spurring causing severe left foraminal stenosis at C6-7 as well as mild disc bulging and spurring at C3-4 and C4-5 without significant stenosis.   ?  ?Past NSAIDS:  Ibuprofen, naproxen ?Past analgesics:  Tylenol ?Past abortive triptans:  rizatriptan 59m (upset stomach) ?Past abortive ergotamine:  none ?Past muscle relaxants:  none ?Past anti-emetic:  none ?Past antihypertensive medications:  none ?Past antidepressant medications:  venlafaxine XR 759m(side effects), Cymbalta ?Past anticonvulsant medications:  gabapentin ?Past anti-CGRP:  none ?Past vitamins/Herbal/Supplements:  none ?Past antihistamines/decongestants:  none ?Other past therapies:  none ? ?PAST MEDICAL HISTORY: ?Past Medical History:  ?Diagnosis Date  ? Abdominal pain, epigastric 11/20/2019  ? Asthma   ? as a child  ? Cervical dysplasia   ? Gastroesophageal reflux disease 11/20/2019  ? Genetic testing 02/01/2018  ? Genetic testing was performed in 2015. The [employer] panel was ordered. In total, 23 genes were analyzed as part of this panel: ATM, BARD1, BRCA1, BRCA2, BRIP1, CDH1, CHEK2, EPCAM, MLH1, MRE11A, MSH2, MSH6, MUTYH, NBN, NF1, PALB2, PMS2, PTEN, RAD50, RAD51C, RAD51D, STK11, and TP53. No pathogenic variants were detected. Genetic testing did detect a Variant of Unknown Significance (VUS) at the t  ? GERD (gastroesophageal reflux disease)   ? had during chemo  ? Headache 06/12/2013  ? History of chemotherapy   ? History of kidney stones   ? History of radiation therapy 11/24/13- 01/08/14  ? reconstructed right breast/chest wall 4500  cGy 25 sessions, electron beam boost 1440 cGy 8 sessions  ? Liver masses 12/06/2016  ? Major depressive disorder, in remission 03/12/2017  ? Malignant neoplasm of lower-inner quadrant of right breast of female, estrogen receptor positive 2012  ? Pneumonia   ? PONV (postoperative nausea and vomiting)   ? Recurrent cancer of right breast 12/22/2013  ? Thrombocytopenia 05/22/2013  ? ? ?MEDICATIONS: ?Current Outpatient Medications on File Prior to Visit  ?Medication Sig Dispense Refill  ? aspirin-acetaminophen-caffeine (EXCEDRIN MIGRAINE) 250-250-65 MG tablet Take by mouth every 6 (six) hours as needed for headache.    ? Biotin 5000 MCG CAPS Take 5,000 mcg by mouth daily.    ? Black Pepper-Turmeric (TURMERIC CURCUMIN) 08-998 MG CAPS Take by mouth 2 (two) times daily.    ? Cholecalciferol (VITAMIN D3) 125 MCG (5000 UT) TBDP Take 1 tablet by mouth daily.    ? Coenzyme Q10 (COQ10) 150 MG CAPS Take 1 capsule by mouth once.    ? finasteride (PROSCAR) 5 MG tablet Take 5 mg by mouth daily. 1/2 tablet daily    ? lidocaine (LIDODERM) 5 % Place 1 patch onto the skin daily. Remove & Discard patch within 12 hours or as directed by MD 30 patch 0  ? lidocaine-prilocaine (EMLA) cream  Apply 1 application topically as needed. 100 g 3  ? Prenat-Fe Poly-Methfol-FA-DHA (VITAFOL FE+) 90-0.6-0.4-200 MG CAPS Take 1 tablet by mouth daily. 30 capsule 11  ? Ubrogepant (UBRELVY) 100 MG TABS Take 1 tablet by mouth as needed (May repeat in 2 hours.  maximum 2 tablets in 24 hours.). 16 tablet 5  ? ?No current facility-administered medications on file prior to visit.  ? ? ?ALLERGIES: ?Allergies  ?Allergen Reactions  ? Tussionex Pennkinetic Er [Hydrocod Poli-Chlorphe Poli Er] Other (See Comments)  ?  hallucinations  ? Morphine Rash  ? Morphine And Related Anxiety  ?  anxiety  ? Penicillin G Rash  ? Penicillins Rash  ? ? ?FAMILY HISTORY: ?Family History  ?Adopted: Yes  ?Problem Relation Age of Onset  ? Heart attack Father   ? Cancer Cousin 25  ?      melanoma; mat first cousin, located on neck  ? Colon cancer Mother 76  ? Rectal cancer Mother   ? Cancer Maternal Aunt 60  ?     stomach and ovarian as separate primaries  ? Cancer Maternal Uncle 17  ?     lung;

## 2021-08-22 ENCOUNTER — Ambulatory Visit (INDEPENDENT_AMBULATORY_CARE_PROVIDER_SITE_OTHER): Payer: Commercial Managed Care - HMO | Admitting: Neurology

## 2021-08-22 ENCOUNTER — Encounter: Payer: Self-pay | Admitting: Neurology

## 2021-08-22 VITALS — BP 105/70 | HR 66 | Wt 130.0 lb

## 2021-08-22 DIAGNOSIS — M5441 Lumbago with sciatica, right side: Secondary | ICD-10-CM

## 2021-08-22 DIAGNOSIS — G8929 Other chronic pain: Secondary | ICD-10-CM | POA: Diagnosis not present

## 2021-08-22 DIAGNOSIS — M5442 Lumbago with sciatica, left side: Secondary | ICD-10-CM

## 2021-08-22 DIAGNOSIS — G43009 Migraine without aura, not intractable, without status migrainosus: Secondary | ICD-10-CM | POA: Diagnosis not present

## 2021-08-22 MED ORDER — NORTRIPTYLINE HCL 10 MG PO CAPS: 10.0000 mg | ORAL_CAPSULE | Freq: Every day | ORAL | 5 refills | Status: DC

## 2021-08-22 NOTE — Patient Instructions (Addendum)
To address headache and nerve pain in back/legs, start nortriptyline '10mg'$  at bedtime.  If no improvement in 4 weeks, contact me and we can increase dose ?Roselyn Meier as needed ?Follow up 4-5 months ?

## 2021-09-13 ENCOUNTER — Encounter: Payer: Self-pay | Admitting: Internal Medicine

## 2021-09-29 ENCOUNTER — Encounter: Payer: Self-pay | Admitting: Adult Health

## 2021-09-30 ENCOUNTER — Other Ambulatory Visit: Payer: Self-pay

## 2021-09-30 ENCOUNTER — Telehealth: Payer: Self-pay

## 2021-09-30 ENCOUNTER — Inpatient Hospital Stay: Payer: Commercial Managed Care - HMO | Attending: Adult Health

## 2021-09-30 DIAGNOSIS — Z08 Encounter for follow-up examination after completed treatment for malignant neoplasm: Secondary | ICD-10-CM | POA: Diagnosis not present

## 2021-09-30 DIAGNOSIS — Z923 Personal history of irradiation: Secondary | ICD-10-CM | POA: Insufficient documentation

## 2021-09-30 DIAGNOSIS — Z853 Personal history of malignant neoplasm of breast: Secondary | ICD-10-CM | POA: Insufficient documentation

## 2021-09-30 DIAGNOSIS — Z9221 Personal history of antineoplastic chemotherapy: Secondary | ICD-10-CM | POA: Diagnosis not present

## 2021-09-30 DIAGNOSIS — C50311 Malignant neoplasm of lower-inner quadrant of right female breast: Secondary | ICD-10-CM

## 2021-09-30 DIAGNOSIS — Z9013 Acquired absence of bilateral breasts and nipples: Secondary | ICD-10-CM | POA: Insufficient documentation

## 2021-09-30 LAB — CMP (CANCER CENTER ONLY)
ALT: 14 U/L (ref 0–44)
AST: 17 U/L (ref 15–41)
Albumin: 4.9 g/dL (ref 3.5–5.0)
Alkaline Phosphatase: 83 U/L (ref 38–126)
Anion gap: 6 (ref 5–15)
BUN: 9 mg/dL (ref 6–20)
CO2: 30 mmol/L (ref 22–32)
Calcium: 10.2 mg/dL (ref 8.9–10.3)
Chloride: 105 mmol/L (ref 98–111)
Creatinine: 0.91 mg/dL (ref 0.44–1.00)
GFR, Estimated: >60 mL/min (ref >60)
Glucose, Bld: 86 mg/dL (ref 70–99)
Potassium: 3.9 mmol/L (ref 3.5–5.1)
Sodium: 141 mmol/L (ref 135–145)
Total Bilirubin: 0.6 mg/dL (ref 0.3–1.2)
Total Protein: 7.3 g/dL (ref 6.5–8.1)

## 2021-09-30 LAB — CBC WITH DIFFERENTIAL (CANCER CENTER ONLY)
Abs Immature Granulocytes: 0.01 10*3/uL (ref 0.00–0.07)
Basophils Absolute: 0 10*3/uL (ref 0.0–0.1)
Basophils Relative: 0 %
Eosinophils Absolute: 0.1 10*3/uL (ref 0.0–0.5)
Eosinophils Relative: 1 %
HCT: 39.7 % (ref 36.0–46.0)
Hemoglobin: 13.3 g/dL (ref 12.0–15.0)
Immature Granulocytes: 0 %
Lymphocytes Relative: 24 %
Lymphs Abs: 1.4 10*3/uL (ref 0.7–4.0)
MCH: 31 pg (ref 26.0–34.0)
MCHC: 33.5 g/dL (ref 30.0–36.0)
MCV: 92.5 fL (ref 80.0–100.0)
Monocytes Absolute: 0.3 10*3/uL (ref 0.1–1.0)
Monocytes Relative: 5 %
Neutro Abs: 4 10*3/uL (ref 1.7–7.7)
Neutrophils Relative %: 70 %
Platelet Count: 300 10*3/uL (ref 150–400)
RBC: 4.29 MIL/uL (ref 3.87–5.11)
RDW: 12 % (ref 11.5–15.5)
WBC Count: 5.9 10*3/uL (ref 4.0–10.5)
nRBC: 0 % (ref 0.0–0.2)

## 2021-09-30 LAB — IRON AND IRON BINDING CAPACITY (CC-WL,HP ONLY)
Iron: 93 ug/dL (ref 28–170)
Saturation Ratios: 26 % (ref 10.4–31.8)
TIBC: 363 ug/dL (ref 250–450)
UIBC: 270 ug/dL (ref 148–442)

## 2021-09-30 LAB — TSH: TSH: 0.861 u[IU]/mL (ref 0.350–4.500)

## 2021-09-30 LAB — FERRITIN: Ferritin: 30 ng/mL (ref 11–307)

## 2021-09-30 LAB — VITAMIN D 25 HYDROXY (VIT D DEFICIENCY, FRACTURES): Vit D, 25-Hydroxy: 82.7 ng/mL (ref 30–100)

## 2021-09-30 NOTE — Progress Notes (Deleted)
Error

## 2021-10-01 LAB — CANCER ANTIGEN 27.29: CA 27.29: 22.4 U/mL (ref 0.0–38.6)

## 2021-10-01 LAB — DHEA-SULFATE: DHEA-SO4: 44.4 ug/dL (ref 41.2–243.7)

## 2021-10-02 LAB — TESTOSTERONE: Testosterone: <3 ng/dL — ABNORMAL LOW (ref 4–50)

## 2021-10-02 LAB — ESTRADIOL: Estradiol: <5 pg/mL

## 2021-10-02 LAB — FOLLICLE STIMULATING HORMONE: FSH: 92.8 m[IU]/mL

## 2021-10-04 LAB — ESTRADIOL, ULTRA SENS: Estradiol, Sensitive: <2.5 pg/mL

## 2021-10-07 ENCOUNTER — Encounter: Payer: Self-pay | Admitting: Adult Health

## 2021-10-07 ENCOUNTER — Inpatient Hospital Stay (HOSPITAL_BASED_OUTPATIENT_CLINIC_OR_DEPARTMENT_OTHER): Payer: Commercial Managed Care - HMO | Admitting: Adult Health

## 2021-10-07 ENCOUNTER — Ambulatory Visit: Payer: Commercial Managed Care - HMO

## 2021-10-07 VITALS — BP 98/69 | HR 74 | Temp 97.7°F | Resp 16 | Ht 63.0 in

## 2021-10-07 DIAGNOSIS — C50311 Malignant neoplasm of lower-inner quadrant of right female breast: Secondary | ICD-10-CM | POA: Diagnosis not present

## 2021-10-07 DIAGNOSIS — M545 Low back pain, unspecified: Secondary | ICD-10-CM | POA: Diagnosis not present

## 2021-10-07 DIAGNOSIS — Z170 Estrogen receptor positive status [ER+]: Secondary | ICD-10-CM | POA: Diagnosis not present

## 2021-10-07 DIAGNOSIS — Z08 Encounter for follow-up examination after completed treatment for malignant neoplasm: Secondary | ICD-10-CM | POA: Diagnosis not present

## 2021-10-07 DIAGNOSIS — G8929 Other chronic pain: Secondary | ICD-10-CM | POA: Diagnosis not present

## 2021-10-07 DIAGNOSIS — R0683 Snoring: Secondary | ICD-10-CM | POA: Diagnosis not present

## 2021-10-07 NOTE — Progress Notes (Unsigned)
[name]:    Hoyt Koch, MD [place] [address]   DIAGNOSIS: Cancer Staging  Malignant neoplasm of lower-inner quadrant of right breast of female, estrogen receptor positive Staging form: Breast, AJCC 7th Edition - Clinical: Stage IA (T1c, N0, M0) - Signed by Chauncey Cruel, MD on 03/30/2015  Recurrent cancer of right breast Staging form: Breast, AJCC 7th Edition - Clinical: Stage IA (T1b, N0, M0) - Signed by Chauncey Cruel, MD on 03/30/2015   SUMMARY OF ONCOLOGIC HISTORY:  BRCA 1-2 negative [name], [place] woman,    (1) status post right breast lower inner quadrant biopsy in September 2012 for a clinically 2.0 cm invasive ductal carcinoma, with some enlarged Right axillary lymph nodes, the largest one of which was negative by biopsy. The tumor was grade 2, estrogen receptor positive at 88%, progesterone receptor positive at 11%, HER-2/neu positive by CISH with a ratio of 4.61, and a proliferation marker of 36%.    (2) treated in the neoadjuvant setting with Q 3 week docetaxel/ carboplatin/ trastuzumab x6, completed 05/18/2011.  Continued on trastuzumab every 3 weeks to complete one year, last dose in November 2013.   (3) s/p bilateral mastectomies with sentinel node biopsy 06/29/2011 for a residual right-sided ypT1c ypN0 invasive ductal carcinoma, grade 2, with immediate expander placement   (4) on tamoxifen as of May 2013, discontinued in early 2015 due to breast cancer recurrence    RECURRENT BREAST CANCER January 2015 (5) status post right lumpectomy with right-sided sentinel lymph node sampling 05/05/2013 for a pT1b cN0, stage IA invasive ductal carcinoma, estrogen receptor 94% positive, progesterone receptor 11% positive, with an MIB-1 of 19% and HER-2 amplification   (6)  Received TDM-1 every 3 weeks x 8 doses completed 10/09/2013; echo 08/25/2013 showed a well preserved ejection fraction   (7)  adjuvant radiation therapy completed 01/08/2014   (8) started anastrozole 04/10/2014              (a) [place] and estradiol in menopausal range 02/04/2014, to be followed every 3 months             (b) bone density 01/15/2013 showed a T score of -1.1             (c) switched to letrozole 07/07/2014, discontinued June 2016 because of arthralgias/myalgias             (d) started exemestane September 2016, stopped March 2017 because of cost issues             (e) bone density 06/17/2015 showed a T score of -1.5              (f) anastrozole resumed April 2017, discontinued by the patient sometime around April 2018   (9) genetic testing (23 genes associated with hereditary ovarian cancer through [facility], April 2015) 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  There were no deleterious mutations noted  CURRENT THERAPY:observation  INTERVAL HISTORY: [name] 51 y.o. female returns for    Patient Active Problem List   Diagnosis Date Noted   Snoring 07/25/2021   Excessive daytime sleepiness 07/25/2021   Routine general medical examination at a health care facility 07/08/2021   Positive ANA (antinuclear antibody) 03/18/2021   Left leg pain 03/18/2021   Arthralgia 03/11/2021   Back pain 02/26/2021   Neck pain 02/26/2021   Gastroesophageal reflux disease 11/20/2019   Major depressive disorder,  in remission 03/12/2017   Liver masses 12/06/2016   Recurrent cancer of right breast 12/22/2013   Headache 06/12/2013   Thrombocytopenia 05/22/2013   Malignant neoplasm of lower-inner quadrant of right breast of female, estrogen receptor positive 2012    is allergic to tussionex pennkinetic er [hydrocod poli-chlorphe poli er], morphine, morphine and related, penicillin g, and penicillins.  MEDICAL HISTORY: Past Medical History:  Diagnosis Date   Abdominal pain, epigastric 11/20/2019   Asthma    as a child   Cervical dysplasia    Gastroesophageal  reflux disease 11/20/2019   Genetic testing 02/01/2018   Genetic testing was performed in 2015. The [employer] OvaNext panel was ordered. In total, 23 genes were analyzed as part of this panel: ATM, BARD1, BRCA1, BRCA2, BRIP1, CDH1, CHEK2, EPCAM, MLH1, MRE11A, MSH2, MSH6, MUTYH, NBN, NF1, PALB2, PMS2, PTEN, RAD50, RAD51C, RAD51D, STK11, and TP53. No pathogenic variants were detected. Genetic testing did detect a Variant of Unknown Significance (VUS) at the t   GERD (gastroesophageal reflux disease)    had during chemo   Headache 06/12/2013   History of chemotherapy    History of kidney stones    History of radiation therapy 11/24/13- 01/08/14   reconstructed right breast/chest wall 4500 cGy 25 sessions, electron beam boost 1440 cGy 8 sessions   Liver masses 12/06/2016   Major depressive disorder, in remission 03/12/2017   Malignant neoplasm of lower-inner quadrant of right breast of female, estrogen receptor positive 2012   Pneumonia    PONV (postoperative nausea and vomiting)    Recurrent cancer of right breast 12/22/2013   Thrombocytopenia 05/22/2013    SURGICAL HISTORY: Past Surgical History:  Procedure Laterality Date   ADENOIDECTOMY     BREAST LUMPECTOMY WITH NEEDLE LOCALIZATION Right 05/05/2013   Procedure: EXCISION RECURRENT CANCER RIGHT BREAST WITH NEEDLE LOCALOZATION;  Surgeon: Adin Hector, MD;  Location: [facility];  Service: General;  Laterality: Right;   BREAST RECONSTRUCTION  06/29/2011   Procedure: BREAST RECONSTRUCTION;  Surgeon: Theodoro Kos, DO;  Location: [facility];  Service: Plastics;  Laterality: Bilateral;  Immediate Bilateral Breast Reconstruction with Bilateral Tissue Expanders and placement of  Alloderm    DILATION AND CURETTAGE OF UTERUS     fibroid tumor surgery     lazy eye     corrective surgery   MASTECTOMY W/ SENTINEL NODE BIOPSY  06/29/2011   Procedure: MASTECTOMY WITH SENTINEL LYMPH NODE BIOPSY;  Surgeon: Adin Hector, MD;  Location: [facility];  Service: General;   Laterality: Right;  right skin spairing mstectomy and right sentinel lymph node biopsy   PORT-A-CATH REMOVAL  03/12/2012   Procedure: MINOR REMOVAL PORT-A-CATH;  Surgeon: Adin Hector, MD;  Location: [facility];  Service: General;  Laterality: N/A;  Upper left   PORTACATH PLACEMENT Left 05/05/2013   Procedure: INSERTION PORT-A-CATH;  Surgeon: Adin Hector, MD;  Location: [facility];  Service: General;  Laterality: Left;   RHINOPLASTY     uterine cervical treatments  2002   for precancerous lesions    SOCIAL HISTORY: Social History   Socioeconomic History   Marital status: Married    Spouse name: Not on file   Number of children: Not on file   Years of education: 10   Highest education level: GED or equivalent  Occupational History   Not on file  Tobacco Use   Smoking status: Former    Types: Cigarettes    Quit date: 07/20/1996    Years since quitting: 25.2  Smokeless tobacco: Never  Vaping Use   Vaping Use: Never used  Substance and Sexual Activity   Alcohol use: Not Currently    Alcohol/week: 0.0 standard drinks of alcohol   Drug use: No   Sexual activity: Yes    Partners: Male  Other Topics Concern   Not on file  Social History Narrative   She was adopted by her grandparents as a child.  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  She works in a Engineer, agricultural.right handed   Two story home    Drinks caffeine occasional   Right handed   Social Determinants of Health   Financial Resource Strain: Not on file  Food Insecurity: Not on file  Transportation Needs: Not on file  Physical Activity: Not on file  Stress: Not on file  Social Connections: Not on file  Intimate Partner Violence: Not on file    FAMILY HISTORY: Family History  Adopted: Yes  Problem Relation Age of Onset   Heart attack Father    Cancer Cousin 93       melanoma; mat first cousin, located on neck   Colon cancer Mother 46   Rectal cancer Mother     Cancer Maternal Aunt 60       stomach and ovarian as separate primaries   Cancer Maternal Uncle 65       lung; smoker   Cancer Maternal Uncle 62       prostate   Cancer Maternal Grandmother 75       enodometrial   Cancer Maternal Aunt 55       breast   Stomach cancer Maternal Aunt     Review of Systems - Oncology    PHYSICAL EXAMINATION  ECOG PERFORMANCE STATUS: {CHL ONC ECOG FF:[id number]}  Vitals:   10/07/21 1102  BP: 98/69  Pulse: 74  Resp: 16  Temp: 97.7 F (36.5 C)  SpO2: 100%    Physical Exam  LABORATORY DATA:  CBC    Component Value Date/Time   WBC 5.9 09/30/2021 1456   WBC 7.2 02/25/2021 1612   RBC 4.29 09/30/2021 1456   HGB 13.3 09/30/2021 1456   HGB 13.4 02/23/2017 1356   HCT 39.7 09/30/2021 1456   HCT 40.9 02/23/2017 1356   PLT 300 09/30/2021 1456   PLT 259 02/23/2017 1356   PLT 249 08/02/2016 1437   MCV 92.5 09/30/2021 1456   MCV 92.8 02/23/2017 1356   MCH 31.0 09/30/2021 1456   MCHC 33.5 09/30/2021 1456   RDW 12.0 09/30/2021 1456   RDW 12.9 02/23/2017 1356   LYMPHSABS 1.4 09/30/2021 1456   LYMPHSABS 1.5 02/23/2017 1356   MONOABS 0.3 09/30/2021 1456   MONOABS 0.4 02/23/2017 1356   EOSABS 0.1 09/30/2021 1456   EOSABS 0.1 02/23/2017 1356   BASOSABS 0.0 09/30/2021 1456   BASOSABS 0.0 02/23/2017 1356    CMP     Component Value Date/Time   NA 141 09/30/2021 1456   NA 142 02/23/2017 1356   K 3.9 09/30/2021 1456   K 4.2 02/23/2017 1356   CL 105 09/30/2021 1456   CL 104 09/23/2012 1415   CO2 30 09/30/2021 1456   CO2 27 02/23/2017 1356   GLUCOSE 86 09/30/2021 1456   GLUCOSE 81 02/23/2017 1356   GLUCOSE 96 09/23/2012 1415   BUN 9 09/30/2021 1456   BUN 11.2 02/23/2017 1356   CREATININE 0.91 09/30/2021 1456   CREATININE 0.9 02/23/2017 1356   CALCIUM 10.2 09/30/2021 1456  CALCIUM 9.9 02/23/2017 1356   PROT 7.3 09/30/2021 1456   PROT 7.2 02/23/2017 1356   ALBUMIN 4.9 09/30/2021 1456   ALBUMIN 4.2 02/23/2017 1356   AST 17 09/30/2021  1456   AST 25 02/23/2017 1356   ALT 14 09/30/2021 1456   ALT 19 02/23/2017 1356   ALKPHOS 83 09/30/2021 1456   ALKPHOS 91 02/23/2017 1356   BILITOT 0.6 09/30/2021 1456   BILITOT 0.49 02/23/2017 1356   GFRNONAA >60 09/30/2021 1456   GFRAA >60 08/11/2019 1204       PENDING LABS:   RADIOGRAPHIC STUDIES:  No results found.   PATHOLOGY:     ASSESSMENT and THERAPY PLAN:   No problem-specific Assessment & Plan notes found for this encounter.   No orders of the defined types were placed in this encounter.   All questions were answered. The patient knows to call the clinic with any problems, questions or concerns. We can certainly see the patient much sooner if necessary. This note was electronically signed. Scot Dock, NP 10/07/2021

## 2021-10-12 NOTE — Assessment & Plan Note (Signed)
[name] is a 51 year old woman with h/o ER positive breast cancer diagnosed in 2012 s/p bilateral mastectomies chemotherapy, and tamoxifen.  She developed recurrence in 2015, stage IA triple positive breast cancer s/p TDM1, adjuvant radiation, and unable to tolerate antiestrogen therapy.    [name] is here today for f/u of breast cancer.  She has no clinical signs of bresat cnacer recurrence.  We reviewed her lab work today which is normal.    We spent a great deal of time discussing healthy living.  In regards to vitamin supplementation, I reviewed the current recommendation for patients to get their needed nutrients from a well balanced diet.    We will see [name] back in one year for labs and f/u.

## 2021-10-19 DIAGNOSIS — R0683 Snoring: Secondary | ICD-10-CM

## 2021-11-15 ENCOUNTER — Ambulatory Visit (HOSPITAL_BASED_OUTPATIENT_CLINIC_OR_DEPARTMENT_OTHER): Payer: Commercial Managed Care - HMO | Attending: Orthopaedic Surgery | Admitting: Physical Therapy

## 2021-11-15 ENCOUNTER — Encounter (HOSPITAL_BASED_OUTPATIENT_CLINIC_OR_DEPARTMENT_OTHER): Payer: Self-pay | Admitting: Physical Therapy

## 2021-11-15 ENCOUNTER — Other Ambulatory Visit: Payer: Self-pay

## 2021-11-15 DIAGNOSIS — C50311 Malignant neoplasm of lower-inner quadrant of right female breast: Secondary | ICD-10-CM | POA: Diagnosis not present

## 2021-11-15 DIAGNOSIS — M545 Low back pain, unspecified: Secondary | ICD-10-CM | POA: Diagnosis not present

## 2021-11-15 DIAGNOSIS — R293 Abnormal posture: Secondary | ICD-10-CM | POA: Diagnosis present

## 2021-11-15 DIAGNOSIS — Z170 Estrogen receptor positive status [ER+]: Secondary | ICD-10-CM | POA: Diagnosis not present

## 2021-11-15 DIAGNOSIS — G8929 Other chronic pain: Secondary | ICD-10-CM | POA: Diagnosis not present

## 2021-11-15 DIAGNOSIS — M5459 Other low back pain: Secondary | ICD-10-CM | POA: Insufficient documentation

## 2021-11-15 NOTE — Therapy (Signed)
OUTPATIENT PHYSICAL THERAPY THORACOLUMBAR EVALUATION   Patient Name: [name] MRN: [id number] DOB:Feb 15, 1971, 51 y.o., female Today's Date: 11/15/2021   PT End of Session - 11/15/21 1329     Visit Number 1    Number of Visits 17    Date for PT Re-Evaluation 01/10/22    Authorization Type [employer]    PT Start Time 1324    PT Stop Time 1400    PT Time Calculation (min) 36 min    Activity Tolerance Patient tolerated treatment well    Behavior During Therapy [facility] for tasks assessed/performed             Past Medical History:  Diagnosis Date   Abdominal pain, epigastric 11/20/2019   Asthma    as a child   Cervical dysplasia    Gastroesophageal reflux disease 11/20/2019   Genetic testing 02/01/2018   Genetic testing was performed in 2015. The [employer] OvaNext panel was ordered. In total, 23 genes were analyzed as part of this panel: ATM, BARD1, BRCA1, BRCA2, BRIP1, CDH1, CHEK2, EPCAM, MLH1, MRE11A, MSH2, MSH6, MUTYH, NBN, NF1, PALB2, PMS2, PTEN, RAD50, RAD51C, RAD51D, STK11, and TP53. No pathogenic variants were detected. Genetic testing did detect a Variant of Unknown Significance (VUS) at the t   GERD (gastroesophageal reflux disease)    had during chemo   Headache 06/12/2013   History of chemotherapy    History of kidney stones    History of radiation therapy 11/24/13- 01/08/14   reconstructed right breast/chest wall 4500 cGy 25 sessions, electron beam boost 1440 cGy 8 sessions   Liver masses 12/06/2016   Major depressive disorder, in remission 03/12/2017   Malignant neoplasm of lower-inner quadrant of right breast of female, estrogen receptor positive 2012   Pneumonia    PONV (postoperative nausea and vomiting)    Recurrent cancer of right breast 12/22/2013   Thrombocytopenia 05/22/2013   Past Surgical History:  Procedure Laterality Date   ADENOIDECTOMY     BREAST LUMPECTOMY WITH NEEDLE LOCALIZATION Right 05/05/2013   Procedure: EXCISION RECURRENT CANCER RIGHT BREAST WITH  NEEDLE LOCALOZATION;  Surgeon: Adin Hector, MD;  Location: [facility];  Service: General;  Laterality: Right;   BREAST RECONSTRUCTION  06/29/2011   Procedure: BREAST RECONSTRUCTION;  Surgeon: Theodoro Kos, DO;  Location: [facility];  Service: Plastics;  Laterality: Bilateral;  Immediate Bilateral Breast Reconstruction with Bilateral Tissue Expanders and placement of  Alloderm    DILATION AND CURETTAGE OF UTERUS     fibroid tumor surgery     lazy eye     corrective surgery   MASTECTOMY W/ SENTINEL NODE BIOPSY  06/29/2011   Procedure: MASTECTOMY WITH SENTINEL LYMPH NODE BIOPSY;  Surgeon: Adin Hector, MD;  Location: [facility];  Service: General;  Laterality: Right;  right skin spairing mstectomy and right sentinel lymph node biopsy   PORT-A-CATH REMOVAL  03/12/2012   Procedure: MINOR REMOVAL PORT-A-CATH;  Surgeon: Adin Hector, MD;  Location: [facility];  Service: General;  Laterality: N/A;  Upper left   PORTACATH PLACEMENT Left 05/05/2013   Procedure: INSERTION PORT-A-CATH;  Surgeon: Adin Hector, MD;  Location: [facility];  Service: General;  Laterality: Left;   RHINOPLASTY     uterine cervical treatments  2002   for precancerous lesions   Patient Active Problem List   Diagnosis Date Noted   Snoring 07/25/2021   Excessive daytime sleepiness 07/25/2021   Routine general medical examination at a health care facility 07/08/2021   Positive ANA (  antinuclear antibody) 03/18/2021   Left leg pain 03/18/2021   Arthralgia 03/11/2021   Back pain 02/26/2021   Neck pain 02/26/2021   Gastroesophageal reflux disease 11/20/2019   Major depressive disorder, in remission 03/12/2017   Liver masses 12/06/2016   Recurrent cancer of right breast 12/22/2013   Headache 06/12/2013   Thrombocytopenia 05/22/2013   Malignant neoplasm of lower-inner quadrant of right breast of female, estrogen receptor positive 2012     REFERRING PROVIDER: Gardenia Phlegm, NP  REFERRING DIAG:   C50.311,Z17.0 (ICD-10-CM) - Malignant neoplasm of lower-inner quadrant of right breast of female, estrogen receptor positive ([place])  M54.50,G89.29 (ICD-10-CM) - Chronic midline low back pain without sciatica    Rationale for Evaluation and Treatment Rehabilitation  THERAPY DIAG:  Other low back pain  Abnormal posture  ONSET DATE: chronic  SUBJECTIVE:                                                                                                                                                                                           SUBJECTIVE STATEMENT: Still trying to do the exercises. Still having a hard time pinpointing what cuases good/bad days. Feel it down into low back and into tailbone. I could really feel it when I went bowling. I felt like the DN was very helpful.   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 Yes: NPRS scale: 5/10 Pain location: low back Pain description: sore Aggravating factors: constant pain Relieving factors: excedrin tension   PRECAUTIONS: None  WEIGHT BEARING RESTRICTIONS No  FALLS:  Has patient fallen in last 6 months? No  PLOF: Independent  PATIENT GOALS decrease pain   OBJECTIVE:   DIAGNOSTIC FINDINGS:  Oct 2022 MRI- hemangioma at L4, fatty infiltration to sacrum.    COGNITION:  Overall cognitive status: Within functional limits for tasks assessed     SENSATION: Tingling in legs with pain, both legs at the same time.   POSTURE:  Standing Lt ASIS elevation Supine- level pelvis with Rt leg shorter  LUMBAR ROM:   WFL  LOWER EXTREMITY ROM:     WFL  LOWER EXTREMITY MMT:    MMT Right eval Left eval  Hip flexion    Hip extension    Hip abduction    Hip adduction    Hip internal rotation    Hip external rotation    Knee flexion    Knee extension    Ankle dorsiflexion    Ankle plantarflexion    Ankle inversion    Ankle eversion     (Blank rows = not  tested)    GAIT: Guarded trunk    TODAY'S TREATMENT  3-layer heel lift  Discussion of cervical exercises, postural alignment   PATIENT EDUCATION:  Education details: Anatomy of condition, POC, HEP, exercise form/rationale Person educated: Patient Education method: Explanation, Demonstration, Tactile cues, Verbal cues, and Handouts Education comprehension: verbalized understanding, returned demonstration, verbal cues required, tactile cues required, and needs further education   HOME EXERCISE PROGRAM: Wear lift & continue with cervical exercises  ASSESSMENT:  CLINICAL IMPRESSION: Patient is a 51 y.o. F who was seen today for physical therapy evaluation and treatment for chronic LBP. 3-layer heel lift in Rt shoe.    OBJECTIVE IMPAIRMENTS decreased activity tolerance, difficulty walking, increased muscle spasms, postural dysfunction, and pain.   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   REHAB POTENTIAL: Good  CLINICAL DECISION MAKING: Stable/uncomplicated  EVALUATION COMPLEXITY: Low   GOALS: Goals reviewed with patient? Yes  SHORT TERM GOALS: Target date:12/06/21  Will determine need for heel lift Baseline: Goal status: INITIAL   LONG TERM GOALS: Target date: 01/10/2022  Bil hip abd strength to meet age appropriate levels per hand held dynamometry Baseline:  Goal status: INITIAL  2.  Able to demonstrate proper form in long term core strength Baseline:  Goal status: INITIAL  3.  Pt will be able to tolerate sitting or standing for 30 minute periods without increase in pain Baseline:  Goal status: INITIAL   PLAN: PT FREQUENCY:  1-2x/week  PT DURATION: 8 weeks  PLANNED INTERVENTIONS: Therapeutic exercises, Therapeutic activity, Neuromuscular re-education, Balance training, Gait training, Patient/Family education, Self Care, Joint mobilization, Stair training, Aquatic Therapy, Spinal mobilization, Cryotherapy, Moist heat, Taping, Manual therapy, and Re-evaluation.  PLAN FOR NEXT SESSION: outcome of heel lift? Core strength  Farzana Koci C. Keni Wafer PT, DPT 11/15/21 9:51 PM

## 2021-11-24 ENCOUNTER — Encounter (HOSPITAL_BASED_OUTPATIENT_CLINIC_OR_DEPARTMENT_OTHER): Payer: Self-pay | Admitting: Physical Therapy

## 2021-11-24 ENCOUNTER — Ambulatory Visit (HOSPITAL_BASED_OUTPATIENT_CLINIC_OR_DEPARTMENT_OTHER): Payer: Commercial Managed Care - HMO | Admitting: Physical Therapy

## 2021-11-24 DIAGNOSIS — R293 Abnormal posture: Secondary | ICD-10-CM

## 2021-11-24 DIAGNOSIS — M5459 Other low back pain: Secondary | ICD-10-CM

## 2021-11-24 NOTE — Therapy (Signed)
OUTPATIENT PHYSICAL THERAPY THORACOLUMBAR EVALUATION   Patient Name: [name] MRN: [id number] DOB:Jul 16, 1970, 51 y.o., female Today's Date: 11/24/2021   PT End of Session - 11/24/21 1641     Visit Number 2    Number of Visits 17    Date for PT Re-Evaluation 01/10/22    Authorization Type [employer]    PT Start Time 1601    PT Stop Time 4315    PT Time Calculation (min) 40 min    Activity Tolerance Patient tolerated treatment well    Behavior During Therapy [facility] for tasks assessed/performed              Past Medical History:  Diagnosis Date   Abdominal pain, epigastric 11/20/2019   Asthma    as a child   Cervical dysplasia    Gastroesophageal reflux disease 11/20/2019   Genetic testing 02/01/2018   Genetic testing was performed in 2015. The [employer] O[employer] panel was ordered. In total, 23 genes were analyzed as part of this panel: ATM, BARD1, BRCA1, BRCA2, BRIP1, CDH1, CHEK2, EPCAM, MLH1, MRE11A, MSH2, MSH6, MUTYH, NBN, NF1, PALB2, PMS2, PTEN, RAD50, RAD51C, RAD51D, STK11, and TP53. No pathogenic variants were detected. Genetic testing did detect a Variant of Unknown Significance (VUS) at the t   GERD (gastroesophageal reflux disease)    had during chemo   Headache 06/12/2013   History of chemotherapy    History of kidney stones    History of radiation therapy 11/24/13- 01/08/14   reconstructed right breast/chest wall 4500 cGy 25 sessions, electron beam boost 1440 cGy 8 sessions   Liver masses 12/06/2016   Major depressive disorder, in remission 03/12/2017   Malignant neoplasm of lower-inner quadrant of right breast of female, estrogen receptor positive 2012   Pneumonia    PONV (postoperative nausea and vomiting)    Recurrent cancer of right breast 12/22/2013   Thrombocytopenia 05/22/2013   Past Surgical History:  Procedure Laterality Date   ADENOIDECTOMY     BREAST LUMPECTOMY WITH NEEDLE LOCALIZATION Right 05/05/2013   Procedure: EXCISION RECURRENT CANCER RIGHT BREAST WITH  NEEDLE LOCALOZATION;  Surgeon: Adin Hector, MD;  Location: [place];  Service: General;  Laterality: Right;   BREAST RECONSTRUCTION  06/29/2011   Procedure: BREAST RECONSTRUCTION;  Surgeon: Theodoro Kos, DO;  Location: [place];  Service: Plastics;  Laterality: Bilateral;  Immediate Bilateral Breast Reconstruction with Bilateral Tissue Expanders and placement of  Alloderm    DILATION AND CURETTAGE OF UTERUS     fibroid tumor surgery     lazy eye     corrective surgery   MASTECTOMY W/ SENTINEL NODE BIOPSY  06/29/2011   Procedure: MASTECTOMY WITH SENTINEL LYMPH NODE BIOPSY;  Surgeon: Adin Hector, MD;  Location: [place];  Service: General;  Laterality: Right;  right skin spairing mstectomy and right sentinel lymph node biopsy   PORT-A-CATH REMOVAL  03/12/2012   Procedure: MINOR REMOVAL PORT-A-CATH;  Surgeon: Adin Hector, MD;  Location: [place];  Service: General;  Laterality: N/A;  Upper left   PORTACATH PLACEMENT Left 05/05/2013   Procedure: INSERTION PORT-A-CATH;  Surgeon: Adin Hector, MD;  Location: [place];  Service: General;  Laterality: Left;   RHINOPLASTY     uterine cervical treatments  2002   for precancerous lesions   Patient Active Problem List   Diagnosis Date Noted   Snoring 07/25/2021   Excessive daytime sleepiness 07/25/2021   Routine general medical examination at a health care facility 07/08/2021   Positive  ANA (antinuclear antibody) 03/18/2021   Left leg pain 03/18/2021   Arthralgia 03/11/2021   Back pain 02/26/2021   Neck pain 02/26/2021   Gastroesophageal reflux disease 11/20/2019   Major depressive disorder, in remission 03/12/2017   Liver masses 12/06/2016   Recurrent cancer of right breast 12/22/2013   Headache 06/12/2013   Thrombocytopenia 05/22/2013   Malignant neoplasm of lower-inner quadrant of right breast of female, estrogen receptor positive 2012     REFERRING PROVIDER: Gardenia Phlegm, NP  REFERRING DIAG:   C50.311,Z17.0 (ICD-10-CM) - Malignant neoplasm of lower-inner quadrant of right breast of female, estrogen receptor positive ([place])  M54.50,G89.29 (ICD-10-CM) - Chronic midline low back pain without sciatica    Rationale for Evaluation and Treatment Rehabilitation  THERAPY DIAG:  Other low back pain  Abnormal posture  ONSET DATE: chronic  SUBJECTIVE:                                                                                                                                                                                           SUBJECTIVE STATEMENT: Pt feels that the heel lift caused a deep burning pain across her SIJ after last session. She took it out that night. She retried it periodically but it continued to cause pain. Pt also notes pain with small forward movements like putting dishes away.  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 Yes: NPRS scale: 5/10 Pain location: low back Pain description: sore Aggravating factors: constant pain Relieving factors: excedrin tension   PRECAUTIONS: None  WEIGHT BEARING RESTRICTIONS No  FALLS:  Has patient fallen in last 6 months? No  PLOF: Independent  PATIENT GOALS decrease pain   OBJECTIVE:   DIAGNOSTIC FINDINGS:  Oct 2022 MRI- hemangioma at L4, fatty infiltration to sacrum.     TODAY'S TREATMENT   STM bilat L3-5 lumbar paraspinals CPA bilat grade II L1-5  Exercises - Supine Bridge with Mini Swiss Ball Between Knees  - 2 x daily - 7 x weekly - 2 sets - 10 reps - Supine Bridge with Resistance Band  - 2 x daily - 7 x weekly - 2 sets - 10 reps    PATIENT EDUCATION:  Education details: Anatomy of condition, POC, HEP, exercise form/rationale Person educated: Patient Education method: Explanation, Demonstration, Tactile cues, Verbal cues, and Handouts Education comprehension: verbalized understanding, returned demonstration, verbal cues required,  tactile cues required, and needs further education   HOME EXERCISE PROGRAM: Access Code: [email or web address] URL: [email or web address] Date: 11/24/2021 Prepared by: Daleen Bo  ASSESSMENT:  CLINICAL IMPRESSION: Pt with report of decreased stiffness and  pain following STM and joint mobs. Pt HEP provided today with lumbopelvic stability exercises. No pain noted during. Pt's pain and pain presentation seem to occur mostly with fwd flexion and lumbar extensor loaded positions. Pt given edu about chronic pain and strengthening needs. Plan to progress lumbopelvic strength as able at next session. Consider addition of hip and lumbar isometric holds for analgesic effect. Pt would benefit from continued skilled therapy in order to reach goals and maximize functional lumbopelvic strength for prevention of further functional decline.    OBJECTIVE IMPAIRMENTS decreased activity tolerance, difficulty walking, increased muscle spasms, postural dysfunction, and pain.   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   REHAB POTENTIAL: Good  CLINICAL DECISION MAKING: Stable/uncomplicated  EVALUATION COMPLEXITY: Low   GOALS: Goals reviewed with patient? Yes  SHORT TERM GOALS: Target date:12/06/21  Will determine need for heel lift Baseline: Goal status: INITIAL   LONG TERM GOALS: Target date: 01/10/2022  Bil hip abd strength to meet age appropriate levels per hand held dynamometry Baseline:  Goal status: INITIAL  2.  Able to demonstrate proper form in long term core strength Baseline:  Goal status: INITIAL  3.  Pt will be able to tolerate sitting or standing for 30  minute periods without increase in pain Baseline:  Goal status: INITIAL   PLAN: PT FREQUENCY: 1-2x/week  PT DURATION: 8 weeks  PLANNED INTERVENTIONS: Therapeutic exercises, Therapeutic activity, Neuromuscular re-education, Balance training, Gait training, Patient/Family education, Self Care, Joint mobilization, Stair training, Aquatic Therapy, Spinal mobilization, Cryotherapy, Moist heat, Taping, Manual therapy, and Re-evaluation.  PLAN FOR NEXT SESSION: outcome of heel lift? Core strength  Daleen Bo PT, DPT 11/24/21 4:46 PM

## 2021-11-30 ENCOUNTER — Encounter (HOSPITAL_BASED_OUTPATIENT_CLINIC_OR_DEPARTMENT_OTHER): Payer: Self-pay | Admitting: Physical Therapy

## 2021-11-30 ENCOUNTER — Ambulatory Visit (HOSPITAL_BASED_OUTPATIENT_CLINIC_OR_DEPARTMENT_OTHER): Payer: Commercial Managed Care - HMO | Admitting: Physical Therapy

## 2021-11-30 DIAGNOSIS — M5459 Other low back pain: Secondary | ICD-10-CM | POA: Diagnosis not present

## 2021-11-30 DIAGNOSIS — R293 Abnormal posture: Secondary | ICD-10-CM

## 2021-11-30 NOTE — Therapy (Signed)
OUTPATIENT PHYSICAL THERAPY THORACOLUMBAR EVALUATION   Patient Name: [name] MRN: [id number] DOB:15-Jul-1970, 51 y.o., female Today's Date: 11/30/2021   PT End of Session - 11/30/21 1414     Visit Number 3    Number of Visits 17    Date for PT Re-Evaluation 01/10/22    Authorization Type [employer]    PT Start Time 1300    PT Stop Time 3086    PT Time Calculation (min) 43 min    Activity Tolerance Patient tolerated treatment well    Behavior During Therapy [facility] for tasks assessed/performed               Past Medical History:  Diagnosis Date   Abdominal pain, epigastric 11/20/2019   Asthma    as a child   Cervical dysplasia    Gastroesophageal reflux disease 11/20/2019   Genetic testing 02/01/2018   Genetic testing was performed in 2015. The [employer] panel was ordered. In total, 23 genes were analyzed as part of this panel: ATM, BARD1, BRCA1, BRCA2, BRIP1, CDH1, CHEK2, EPCAM, MLH1, MRE11A, MSH2, MSH6, MUTYH, NBN, NF1, PALB2, PMS2, PTEN, RAD50, RAD51C, RAD51D, STK11, and TP53. No pathogenic variants were detected. Genetic testing did detect a Variant of Unknown Significance (VUS) at the t   GERD (gastroesophageal reflux disease)    had during chemo   Headache 06/12/2013   History of chemotherapy    History of kidney stones    History of radiation therapy 11/24/13- 01/08/14   reconstructed right breast/chest wall 4500 cGy 25 sessions, electron beam boost 1440 cGy 8 sessions   Liver masses 12/06/2016   Major depressive disorder, in remission 03/12/2017   Malignant neoplasm of lower-inner quadrant of right breast of female, estrogen receptor positive 2012   Pneumonia    PONV (postoperative nausea and vomiting)    Recurrent cancer of right breast 12/22/2013   Thrombocytopenia 05/22/2013   Past Surgical History:  Procedure Laterality Date   ADENOIDECTOMY     BREAST LUMPECTOMY WITH NEEDLE LOCALIZATION Right 05/05/2013   Procedure: EXCISION RECURRENT CANCER RIGHT BREAST WITH  NEEDLE LOCALOZATION;  Surgeon: Adin Hector, MD;  Location: [place];  Service: General;  Laterality: Right;   BREAST RECONSTRUCTION  06/29/2011   Procedure: BREAST RECONSTRUCTION;  Surgeon: Theodoro Kos, DO;  Location: [place];  Service: Plastics;  Laterality: Bilateral;  Immediate Bilateral Breast Reconstruction with Bilateral Tissue Expanders and placement of  Alloderm    DILATION AND CURETTAGE OF UTERUS     fibroid tumor surgery     lazy eye     corrective surgery   MASTECTOMY W/ SENTINEL NODE BIOPSY  06/29/2011   Procedure: MASTECTOMY WITH SENTINEL LYMPH NODE BIOPSY;  Surgeon: Adin Hector, MD;  Location: [place];  Service: General;  Laterality: Right;  right skin spairing mstectomy and right sentinel lymph node biopsy   PORT-A-CATH REMOVAL  03/12/2012   Procedure: MINOR REMOVAL PORT-A-CATH;  Surgeon: Adin Hector, MD;  Location: [place];  Service: General;  Laterality: N/A;  Upper left   PORTACATH PLACEMENT Left 05/05/2013   Procedure: INSERTION PORT-A-CATH;  Surgeon: Adin Hector, MD;  Location: [place];  Service: General;  Laterality: Left;   RHINOPLASTY     uterine cervical treatments  2002   for precancerous lesions   Patient Active Problem List   Diagnosis Date Noted   Snoring 07/25/2021   Excessive daytime sleepiness 07/25/2021   Routine general medical examination at a health care facility 07/08/2021  Positive ANA (antinuclear antibody) 03/18/2021   Left leg pain 03/18/2021   Arthralgia 03/11/2021   Back pain 02/26/2021   Neck pain 02/26/2021   Gastroesophageal reflux disease 11/20/2019   Major depressive disorder, in remission 03/12/2017   Liver masses 12/06/2016   Recurrent cancer of right breast 12/22/2013   Headache 06/12/2013   Thrombocytopenia 05/22/2013   Malignant neoplasm of lower-inner quadrant of right breast of female, estrogen receptor positive 2012     REFERRING PROVIDER: Gardenia Phlegm, NP  REFERRING DIAG:   C50.311,Z17.0 (ICD-10-CM) - Malignant neoplasm of lower-inner quadrant of right breast of female, estrogen receptor positive ([place])  M54.50,G89.29 (ICD-10-CM) - Chronic midline low back pain without sciatica    Rationale for Evaluation and Treatment Rehabilitation  THERAPY DIAG:  Other low back pain  Abnormal posture  ONSET DATE: chronic  SUBJECTIVE:                                                                                                                                                                                           SUBJECTIVE STATEMENT: The patient reports the main issue today is pain in her neck. She was looking down in the car and had significant pain. She continues to have pain in her lower back but it has improved fromt he manual therapy the last visit.   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 Yes: NPRS scale: 5/10 Pain location: low back Pain description: sore Aggravating factors: constant pain Relieving factors: excedrin tension   PRECAUTIONS: None  WEIGHT BEARING RESTRICTIONS No  FALLS:  Has patient fallen in last 6 months? No  PLOF: Independent  PATIENT GOALS decrease pain   OBJECTIVE:   DIAGNOSTIC FINDINGS:  Oct 2022 MRI- hemangioma at L4, fatty infiltration to sacrum.     TODAY'S TREATMENT  8/23 STM bilat L3-5 lumbar paraspinals CPA bilat grade II L1-5 LAD bilateral   Gluteal stretch 3x20 sec hold  Reviewed single knee to chest stretch 3x20 sec hold   Theracane and tennis ball for self soft tissue   Row with abdominal activation 2x10 yellow  Ran out of time for extension.      Last visit:  STM bilat L3-5 lumbar paraspinals CPA bilat grade II L1-5  Exercises - Supine Bridge with Mini Swiss Ball Between Knees  - 2 x daily - 7 x weekly - 2 sets - 10 reps - Supine Bridge with Resistance Band  - 2 x daily - 7 x weekly - 2 sets - 10 reps    PATIENT EDUCATION:   Education details: Geophysicist/field seismologist of  condition, POC, HEP, exercise form/rationale Person educated: Patient Education method: Explanation, Demonstration, Tactile cues, Verbal cues, and Handouts Education comprehension: verbalized understanding, returned demonstration, verbal cues required, tactile cues required, and needs further education   HOME EXERCISE PROGRAM: Access Code: [email or web address] URL: [email or web address] Date: 11/24/2021 Prepared by: Daleen Bo  ASSESSMENT:  CLINICAL IMPRESSION: Therapy worked on soft tissue mobilization, but also reviewed stretches and light exercises for her program. She was having neck pain today. We reviewed stretches and soft tissue mobilization that she can use for her neck and back. She was advised in order for use to treat her neck formally we would need a script from her MD. We reviewed HEP and how to use it at home. She already does a single knee to chest stretch which helps so we put that on the list. She could feel the glute stretch in the area we did soft tissue mobilization.  OBJECTIVE IMPAIRMENTS decreased activity tolerance, difficulty walking, increased muscle spasms, postural dysfunction, and pain.   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   REHAB POTENTIAL: Good  CLINICAL DECISION MAKING: Stable/uncomplicated  EVALUATION COMPLEXITY: Low   GOALS: Goals reviewed with patient? Yes  SHORT TERM GOALS: Target date:12/06/21  Will determine need for heel lift Baseline: Goal status: INITIAL   LONG TERM GOALS: Target date: 01/10/2022  Bil hip abd strength to meet age appropriate levels per hand held  dynamometry Baseline:  Goal status: INITIAL  2.  Able to demonstrate proper form in long term core strength Baseline:  Goal status: INITIAL  3.  Pt will be able to tolerate sitting or standing for 30 minute periods without increase in pain Baseline:  Goal status: INITIAL   PLAN: PT FREQUENCY: 1-2x/week  PT DURATION: 8 weeks  PLANNED INTERVENTIONS: Therapeutic exercises, Therapeutic activity, Neuromuscular re-education, Balance training, Gait training, Patient/Family education, Self Care, Joint mobilization, Stair training, Aquatic Therapy, Spinal mobilization, Cryotherapy, Moist heat, Taping, Manual therapy, and Re-evaluation.  PLAN FOR NEXT SESSION: outcome of heel lift? Core strength  Carolyne Littles PT DPT  11/30/21 2:18 PM

## 2021-12-07 ENCOUNTER — Encounter (HOSPITAL_BASED_OUTPATIENT_CLINIC_OR_DEPARTMENT_OTHER): Payer: Self-pay | Admitting: Physical Therapy

## 2021-12-07 ENCOUNTER — Ambulatory Visit (HOSPITAL_BASED_OUTPATIENT_CLINIC_OR_DEPARTMENT_OTHER): Payer: Commercial Managed Care - HMO | Admitting: Physical Therapy

## 2021-12-07 DIAGNOSIS — M5459 Other low back pain: Secondary | ICD-10-CM

## 2021-12-07 DIAGNOSIS — R293 Abnormal posture: Secondary | ICD-10-CM

## 2021-12-07 NOTE — Therapy (Signed)
OUTPATIENT PHYSICAL THERAPY THORACOLUMBAR TREATMENT   Patient Name: [name] MRN: [id number] DOB:02-01-1971, 51 y.o., female Today's Date: 12/07/2021   PT End of Session - 12/07/21 1312     Visit Number 4    Number of Visits 17    Date for PT Re-Evaluation 01/10/22    Authorization Type [employer]    PT Start Time 1312   arrives late   PT Stop Time 1345    PT Time Calculation (min) 33 min    Activity Tolerance Patient tolerated treatment well    Behavior During Therapy [facility] for tasks assessed/performed               Past Medical History:  Diagnosis Date   Abdominal pain, epigastric 11/20/2019   Asthma    as a child   Cervical dysplasia    Gastroesophageal reflux disease 11/20/2019   Genetic testing 02/01/2018   Genetic testing was performed in 2015. The [employer] O[employer] panel was ordered. In total, 23 genes were analyzed as part of this panel: ATM, BARD1, BRCA1, BRCA2, BRIP1, CDH1, CHEK2, EPCAM, MLH1, MRE11A, MSH2, MSH6, MUTYH, NBN, NF1, PALB2, PMS2, PTEN, RAD50, RAD51C, RAD51D, STK11, and TP53. No pathogenic variants were detected. Genetic testing did detect a Variant of Unknown Significance (VUS) at the t   GERD (gastroesophageal reflux disease)    had during chemo   Headache 06/12/2013   History of chemotherapy    History of kidney stones    History of radiation therapy 11/24/13- 01/08/14   reconstructed right breast/chest wall 4500 cGy 25 sessions, electron beam boost 1440 cGy 8 sessions   Liver masses 12/06/2016   Major depressive disorder, in remission 03/12/2017   Malignant neoplasm of lower-inner quadrant of right breast of female, estrogen receptor positive 2012   Pneumonia    PONV (postoperative nausea and vomiting)    Recurrent cancer of right breast 12/22/2013   Thrombocytopenia 05/22/2013   Past Surgical History:  Procedure Laterality Date   ADENOIDECTOMY     BREAST LUMPECTOMY WITH NEEDLE LOCALIZATION Right 05/05/2013   Procedure: EXCISION RECURRENT CANCER  RIGHT BREAST WITH NEEDLE LOCALOZATION;  Surgeon: Adin Hector, MD;  Location: [place];  Service: General;  Laterality: Right;   BREAST RECONSTRUCTION  06/29/2011   Procedure: BREAST RECONSTRUCTION;  Surgeon: Theodoro Kos, DO;  Location: [place];  Service: Plastics;  Laterality: Bilateral;  Immediate Bilateral Breast Reconstruction with Bilateral Tissue Expanders and placement of  Alloderm    DILATION AND CURETTAGE OF UTERUS     fibroid tumor surgery     lazy eye     corrective surgery   MASTECTOMY W/ SENTINEL NODE BIOPSY  06/29/2011   Procedure: MASTECTOMY WITH SENTINEL LYMPH NODE BIOPSY;  Surgeon: Adin Hector, MD;  Location: [place];  Service: General;  Laterality: Right;  right skin spairing mstectomy and right sentinel lymph node biopsy   PORT-A-CATH REMOVAL  03/12/2012   Procedure: MINOR REMOVAL PORT-A-CATH;  Surgeon: Adin Hector, MD;  Location: [place];  Service: General;  Laterality: N/A;  Upper left   PORTACATH PLACEMENT Left 05/05/2013   Procedure: INSERTION PORT-A-CATH;  Surgeon: Adin Hector, MD;  Location: [place];  Service: General;  Laterality: Left;   RHINOPLASTY     uterine cervical treatments  2002   for precancerous lesions   Patient Active Problem List   Diagnosis Date Noted   Snoring 07/25/2021   Excessive daytime sleepiness 07/25/2021   Routine general medical examination at a health care facility  07/08/2021   Positive ANA (antinuclear antibody) 03/18/2021   Left leg pain 03/18/2021   Arthralgia 03/11/2021   Back pain 02/26/2021   Neck pain 02/26/2021   Gastroesophageal reflux disease 11/20/2019   Major depressive disorder, in remission 03/12/2017   Liver masses 12/06/2016   Recurrent cancer of right breast 12/22/2013   Headache 06/12/2013   Thrombocytopenia 05/22/2013   Malignant neoplasm of lower-inner quadrant of right breast of female, estrogen receptor positive 2012     REFERRING PROVIDER: Gardenia Phlegm, NP  REFERRING  DIAG:  C50.311,Z17.0 (ICD-10-CM) - Malignant neoplasm of lower-inner quadrant of right breast of female, estrogen receptor positive ([place])  M54.50,G89.29 (ICD-10-CM) - Chronic midline low back pain without sciatica    Rationale for Evaluation and Treatment Rehabilitation  THERAPY DIAG:  Other low back pain  Abnormal posture  ONSET DATE: chronic  SUBJECTIVE:                                                                                                                                                                                           SUBJECTIVE STATEMENT: Pt states she had some back pain and spasms while trying mop around the house. Pulling weeds also increased her back pain. Half bent/teeth brushing position will cause spasming.  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 Yes: NPRS scale: 4-5/10 Pain location: low back Pain description: sore Aggravating factors: constant pain Relieving factors: excedrin tension   PRECAUTIONS: None  WEIGHT BEARING RESTRICTIONS No  FALLS:  Has patient fallen in last 6 months? No  PLOF: Independent  PATIENT GOALS decrease pain   OBJECTIVE:   DIAGNOSTIC FINDINGS:  Oct 2022 MRI- hemangioma at L4, fatty infiltration to sacrum.      TODAY'S TREATMENT   8/30  STM bilat L1-3 lumbar paraspinals STM bilat T9-12 thoracic paraspinals CPA bilat grade III T9-L3  Self T/S mobilization with peanut demo Cat/cow 2x breath cycles 10x   Trigger Point Dry-Needling  Treatment instructions: Expect mild to moderate muscle soreness. S/S of pneumothorax if dry needled over a lung field, and to seek immediate medical attention should they occur. Patient verbalized understanding of these instructions and education.   Patient Consent Given: Yes Education (verbally)provided: Yes Muscles treated: T8-10 paraspinals/multifidi Electrical stimulation performed: N/A Treatment  response/outcome: significant LTR with spasming and grabbing of needle, pistoning performed very minimally due to significant twitch response  8/23 STM bilat L3-5 lumbar paraspinals CPA bilat grade II L1-5 LAD bilateral   Gluteal stretch 3x20 sec hold  Reviewed single knee to chest stretch 3x20 sec hold   Theracane  and tennis ball for self soft tissue   Row with abdominal activation 2x10 yellow  Ran out of time for extension.      Last visit:  STM bilat L3-5 lumbar paraspinals CPA bilat grade II L1-5  Exercises - Supine Bridge with Mini Swiss Ball Between Knees  - 2 x daily - 7 x weekly - 2 sets - 10 reps - Supine Bridge with Resistance Band  - 2 x daily - 7 x weekly - 2 sets - 10 reps    PATIENT EDUCATION:  Education details: Anatomy of condition, POC, HEP, exercise form/rationale Person educated: Patient Education method: Explanation, Demonstration, Tactile cues, Verbal cues, and Handouts Education comprehension: verbalized understanding, returned demonstration, verbal cues required, tactile cues required, and needs further education   HOME EXERCISE PROGRAM: Access Code: [email or web address] URL: [email or web address] Date: 11/24/2021 Prepared by: Daleen Bo  ASSESSMENT:  CLINICAL IMPRESSION: Session limited by pt arrival time. Pt with report of increased pain over the weekend with ADL. Upon assessment, pt with significantly spasm of lower T/S paraspinals that is likely contributing to L/S pain and stiffness. Pt reports relief of stiffness following session. Pt HEP updated to include T/S stretching/mobility exercise as well. Plan to assess for response to T/S TPDN and exercise. Consider addition of T/S foam rolling and repeat of manual and TPDN as needed. Pt would benefit from continued skilled therapy in order to reach goals and maximize functional thoracolumbar strength and ROM for prevention of further functional decline.    OBJECTIVE IMPAIRMENTS decreased  activity tolerance, difficulty walking, increased muscle spasms, postural dysfunction, and pain.   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   REHAB POTENTIAL: Good  CLINICAL DECISION MAKING: Stable/uncomplicated  EVALUATION COMPLEXITY: Low   GOALS: Goals reviewed with patient? Yes  SHORT TERM GOALS: Target date:12/06/21  Will determine need for heel lift Baseline: Goal status: INITIAL   LONG TERM GOALS: Target date: 01/10/2022  Bil hip abd strength to meet age appropriate levels per hand held dynamometry Baseline:  Goal status: INITIAL  2.  Able to demonstrate proper form in long term core strength Baseline:  Goal status: INITIAL  3.  Pt will be able to tolerate sitting or standing for 30 minute periods without increase in pain Baseline:  Goal status: INITIAL   PLAN: PT FREQUENCY: 1-2x/week  PT DURATION: 8 weeks  PLANNED INTERVENTIONS: Therapeutic exercises, Therapeutic activity, Neuromuscular re-education, Balance training, Gait training, Patient/Family education, Self Care, Joint mobilization, Stair training, Aquatic Therapy, Spinal mobilization, Cryotherapy, Moist heat, Taping, Manual therapy, and Re-evaluation.  PLAN FOR NEXT SESSION: TPDN PRN, joint mobs, T/S mobility, self STM, supermans and hollow holds  Daleen Bo PT, DPT 12/07/21 2:05 PM

## 2021-12-14 ENCOUNTER — Ambulatory Visit (HOSPITAL_BASED_OUTPATIENT_CLINIC_OR_DEPARTMENT_OTHER): Payer: Commercial Managed Care - HMO | Attending: Orthopaedic Surgery | Admitting: Physical Therapy

## 2021-12-14 ENCOUNTER — Encounter (HOSPITAL_BASED_OUTPATIENT_CLINIC_OR_DEPARTMENT_OTHER): Payer: Self-pay | Admitting: Physical Therapy

## 2021-12-14 DIAGNOSIS — G8929 Other chronic pain: Secondary | ICD-10-CM | POA: Insufficient documentation

## 2021-12-14 DIAGNOSIS — M545 Low back pain, unspecified: Secondary | ICD-10-CM | POA: Insufficient documentation

## 2021-12-14 DIAGNOSIS — M5459 Other low back pain: Secondary | ICD-10-CM | POA: Insufficient documentation

## 2021-12-14 DIAGNOSIS — M542 Cervicalgia: Secondary | ICD-10-CM | POA: Diagnosis present

## 2021-12-14 DIAGNOSIS — R293 Abnormal posture: Secondary | ICD-10-CM | POA: Diagnosis present

## 2021-12-14 DIAGNOSIS — R262 Difficulty in walking, not elsewhere classified: Secondary | ICD-10-CM | POA: Diagnosis present

## 2021-12-14 NOTE — Therapy (Signed)
OUTPATIENT PHYSICAL THERAPY THORACOLUMBAR TREATMENT   Patient Name: [name] MRN: [id number] DOB:1970/05/07, 51 y.o., female Today's Date: 12/14/2021   PT End of Session - 12/14/21 1025     Visit Number 5    Number of Visits 17    Date for PT Re-Evaluation 01/10/22    Authorization Type [employer]    PT Start Time 1021    PT Stop Time 1103    PT Time Calculation (min) 42 min    Activity Tolerance Patient tolerated treatment well    Behavior During Therapy [facility] for tasks assessed/performed                Past Medical History:  Diagnosis Date   Abdominal pain, epigastric 11/20/2019   Asthma    as a child   Cervical dysplasia    Gastroesophageal reflux disease 11/20/2019   Genetic testing 02/01/2018   Genetic testing was performed in 2015. The [employer] O[employer] panel was ordered. In total, 23 genes were analyzed as part of this panel: ATM, BARD1, BRCA1, BRCA2, BRIP1, CDH1, CHEK2, EPCAM, MLH1, MRE11A, MSH2, MSH6, MUTYH, NBN, NF1, PALB2, PMS2, PTEN, RAD50, RAD51C, RAD51D, STK11, and TP53. No pathogenic variants were detected. Genetic testing did detect a Variant of Unknown Significance (VUS) at the t   GERD (gastroesophageal reflux disease)    had during chemo   Headache 06/12/2013   History of chemotherapy    History of kidney stones    History of radiation therapy 11/24/13- 01/08/14   reconstructed right breast/chest wall 4500 cGy 25 sessions, electron beam boost 1440 cGy 8 sessions   Liver masses 12/06/2016   Major depressive disorder, in remission 03/12/2017   Malignant neoplasm of lower-inner quadrant of right breast of female, estrogen receptor positive 2012   Pneumonia    PONV (postoperative nausea and vomiting)    Recurrent cancer of right breast 12/22/2013   Thrombocytopenia 05/22/2013   Past Surgical History:  Procedure Laterality Date   ADENOIDECTOMY     BREAST LUMPECTOMY WITH NEEDLE LOCALIZATION Right 05/05/2013   Procedure: EXCISION RECURRENT CANCER RIGHT BREAST WITH  NEEDLE LOCALOZATION;  Surgeon: Adin Hector, MD;  Location: [place];  Service: General;  Laterality: Right;   BREAST RECONSTRUCTION  06/29/2011   Procedure: BREAST RECONSTRUCTION;  Surgeon: Theodoro Kos, DO;  Location: [place];  Service: Plastics;  Laterality: Bilateral;  Immediate Bilateral Breast Reconstruction with Bilateral Tissue Expanders and placement of  Alloderm    DILATION AND CURETTAGE OF UTERUS     fibroid tumor surgery     lazy eye     corrective surgery   MASTECTOMY W/ SENTINEL NODE BIOPSY  06/29/2011   Procedure: MASTECTOMY WITH SENTINEL LYMPH NODE BIOPSY;  Surgeon: Adin Hector, MD;  Location: [place];  Service: General;  Laterality: Right;  right skin spairing mstectomy and right sentinel lymph node biopsy   PORT-A-CATH REMOVAL  03/12/2012   Procedure: MINOR REMOVAL PORT-A-CATH;  Surgeon: Adin Hector, MD;  Location: [place];  Service: General;  Laterality: N/A;  Upper left   PORTACATH PLACEMENT Left 05/05/2013   Procedure: INSERTION PORT-A-CATH;  Surgeon: Adin Hector, MD;  Location: [place];  Service: General;  Laterality: Left;   RHINOPLASTY     uterine cervical treatments  2002   for precancerous lesions   Patient Active Problem List   Diagnosis Date Noted   Snoring 07/25/2021   Excessive daytime sleepiness 07/25/2021   Routine general medical examination at a health care facility 07/08/2021  Positive ANA (antinuclear antibody) 03/18/2021   Left leg pain 03/18/2021   Arthralgia 03/11/2021   Back pain 02/26/2021   Neck pain 02/26/2021   Gastroesophageal reflux disease 11/20/2019   Major depressive disorder, in remission 03/12/2017   Liver masses 12/06/2016   Recurrent cancer of right breast 12/22/2013   Headache 06/12/2013   Thrombocytopenia 05/22/2013   Malignant neoplasm of lower-inner quadrant of right breast of female, estrogen receptor positive 2012     REFERRING PROVIDER: Gardenia Phlegm, NP  REFERRING DIAG:   C50.311,Z17.0 (ICD-10-CM) - Malignant neoplasm of lower-inner quadrant of right breast of female, estrogen receptor positive ([place])  M54.50,G89.29 (ICD-10-CM) - Chronic midline low back pain without sciatica    Rationale for Evaluation and Treatment Rehabilitation  THERAPY DIAG:  Other low back pain  Abnormal posture  ONSET DATE: chronic  SUBJECTIVE:                                                                                                                                                                                           SUBJECTIVE STATEMENT: Was really sore after DN last time but better after that. Was really tired last night and went to bed earlier than normal. I feel achey today- took excedrin tension, hot shower and used massager. I wonder if I am using my back too much to do bridges.   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 Yes: NPRS scale: 4-5/10 Pain location: low back Pain description: sore Aggravating factors: constant pain Relieving factors: excedrin tension   PRECAUTIONS: None  WEIGHT BEARING RESTRICTIONS No  FALLS:  Has patient fallen in last 6 months? No  PLOF: Independent  PATIENT GOALS decrease pain   OBJECTIVE:   DIAGNOSTIC FINDINGS:  Oct 2022 MRI- hemangioma at L4, fatty infiltration to sacrum.    TODAY'S TREATMENT   9/6  MANUAL: thoracic & rib mobilizations, STM to Rt thoraco lumbar paraspinals Prone superman ext x12, fingers behind head Child pose with sidebend Quadruped hip extension & fire hydrant with ball behind knee, 2 rounds x15 each Dead lift with blue tband- cues for glut activaiton   PATIENT EDUCATION:  Education details: Anatomy of condition, POC, HEP, exercise form/rationale Person educated: Patient Education method: Explanation, Demonstration, Tactile cues, Verbal cues, and Handouts Education comprehension: verbalized understanding, returned  demonstration, verbal cues required, tactile cues required, and needs further education   HOME EXERCISE PROGRAM: Access Code: [email or web address] URL: [email or web address]   ASSESSMENT:  CLINICAL IMPRESSION: Removed bridge position and replaced with quadruped for improved core control as bridges were creating lumbosacral discomfort. Tends to  rest in a Lt cervical sidebend and encouraged her to be aware of stacked posture.     OBJECTIVE IMPAIRMENTS decreased activity tolerance, difficulty walking, increased muscle spasms, postural dysfunction, and pain.   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   REHAB POTENTIAL: Good  CLINICAL DECISION MAKING: Stable/uncomplicated  EVALUATION COMPLEXITY: Low   GOALS: Goals reviewed with patient? Yes  SHORT TERM GOALS: Target date:12/06/21  Will determine need for heel lift Baseline: Goal status: achieved   LONG TERM GOALS: Target date: 01/10/2022  Bil hip abd strength to meet age appropriate levels per hand held dynamometry Baseline:  Goal status: INITIAL  2.  Able to demonstrate proper form in long term core strength Baseline:  Goal status: INITIAL  3.  Pt will be able to tolerate sitting or standing for 30 minute periods without increase in pain Baseline:  Goal status: INITIAL   PLAN: PT FREQUENCY: 1-2x/week  PT DURATION: 8 weeks  PLANNED INTERVENTIONS: Therapeutic exercises, Therapeutic activity, Neuromuscular re-education, Balance training, Gait training, Patient/Family education, Self Care, Joint mobilization, Stair training, Aquatic Therapy, Spinal mobilization, Cryotherapy, Moist heat, Taping,  Manual therapy, and Re-evaluation.  PLAN FOR NEXT SESSION: TPDN PRN, continue with core stability training  Alyssia Heese C. Aboubacar Matsuo PT, DPT 12/14/21 11:03 AM

## 2021-12-21 ENCOUNTER — Encounter (HOSPITAL_BASED_OUTPATIENT_CLINIC_OR_DEPARTMENT_OTHER): Payer: Self-pay | Admitting: Physical Therapy

## 2021-12-21 ENCOUNTER — Ambulatory Visit (HOSPITAL_BASED_OUTPATIENT_CLINIC_OR_DEPARTMENT_OTHER): Payer: Commercial Managed Care - HMO | Admitting: Physical Therapy

## 2021-12-21 DIAGNOSIS — M5459 Other low back pain: Secondary | ICD-10-CM | POA: Diagnosis not present

## 2021-12-21 DIAGNOSIS — R293 Abnormal posture: Secondary | ICD-10-CM

## 2021-12-21 DIAGNOSIS — M542 Cervicalgia: Secondary | ICD-10-CM

## 2021-12-21 DIAGNOSIS — G8929 Other chronic pain: Secondary | ICD-10-CM

## 2021-12-21 DIAGNOSIS — R262 Difficulty in walking, not elsewhere classified: Secondary | ICD-10-CM

## 2021-12-21 NOTE — Therapy (Signed)
OUTPATIENT PHYSICAL THERAPY THORACOLUMBAR TREATMENT   Patient Name: [name] MRN: [id number] DOB:[birth year], 51 y.o., female Today's Date: 12/21/2021   PT End of Session - 12/21/21 1422     Visit Number 6    Number of Visits 17    Date for PT Re-Evaluation 01/10/22    Authorization Type [employer]    PT Start Time 1346    PT Stop Time 1425    PT Time Calculation (min) 39 min    Activity Tolerance Patient tolerated treatment well    Behavior During Therapy [facility] for tasks assessed/performed                 Past Medical History:  Diagnosis Date   Abdominal pain, epigastric 11/20/2019   Asthma    as a child   Cervical dysplasia    Gastroesophageal reflux disease 11/20/2019   Genetic testing 02/01/2018   Genetic testing was performed in 2015. The [employer] O[employer] panel was ordered. In total, 23 genes were analyzed as part of this panel: ATM, BARD1, BRCA1, BRCA2, BRIP1, CDH1, CHEK2, EPCAM, MLH1, MRE11A, MSH2, MSH6, MUTYH, NBN, NF1, PALB2, PMS2, PTEN, RAD50, RAD51C, RAD51D, STK11, and TP53. No pathogenic variants were detected. Genetic testing did detect a Variant of Unknown Significance (VUS) at the t   GERD (gastroesophageal reflux disease)    had during chemo   Headache 06/12/2013   History of chemotherapy    History of kidney stones    History of radiation therapy 11/24/13- 01/08/14   reconstructed right breast/chest wall 4500 cGy 25 sessions, electron beam boost 1440 cGy 8 sessions   Liver masses 12/06/2016   Major depressive disorder, in remission 03/12/2017   Malignant neoplasm of lower-inner quadrant of right breast of female, estrogen receptor positive 2012   Pneumonia    PONV (postoperative nausea and vomiting)    Recurrent cancer of right breast 12/22/2013   Thrombocytopenia 05/22/2013   Past Surgical History:  Procedure Laterality Date   ADENOIDECTOMY     BREAST LUMPECTOMY WITH NEEDLE LOCALIZATION Right 05/05/2013   Procedure: EXCISION RECURRENT CANCER RIGHT BREAST  WITH NEEDLE LOCALOZATION;  Surgeon: Adin Hector, MD;  Location: [place];  Service: General;  Laterality: Right;   BREAST RECONSTRUCTION  06/29/2011   Procedure: BREAST RECONSTRUCTION;  Surgeon: Theodoro Kos, DO;  Location: [place];  Service: Plastics;  Laterality: Bilateral;  Immediate Bilateral Breast Reconstruction with Bilateral Tissue Expanders and placement of  Alloderm    DILATION AND CURETTAGE OF UTERUS     fibroid tumor surgery     lazy eye     corrective surgery   MASTECTOMY W/ SENTINEL NODE BIOPSY  06/29/2011   Procedure: MASTECTOMY WITH SENTINEL LYMPH NODE BIOPSY;  Surgeon: Adin Hector, MD;  Location: [place];  Service: General;  Laterality: Right;  right skin spairing mstectomy and right sentinel lymph node biopsy   PORT-A-CATH REMOVAL  03/12/2012   Procedure: MINOR REMOVAL PORT-A-CATH;  Surgeon: Adin Hector, MD;  Location: [place];  Service: General;  Laterality: N/A;  Upper left   PORTACATH PLACEMENT Left 05/05/2013   Procedure: INSERTION PORT-A-CATH;  Surgeon: Adin Hector, MD;  Location: [place];  Service: General;  Laterality: Left;   RHINOPLASTY     uterine cervical treatments  2002   for precancerous lesions   Patient Active Problem List   Diagnosis Date Noted   Snoring 07/25/2021   Excessive daytime sleepiness 07/25/2021   Routine general medical examination at a health care facility 07/08/2021  Positive ANA (antinuclear antibody) 03/18/2021   Left leg pain 03/18/2021   Arthralgia 03/11/2021   Back pain 02/26/2021   Neck pain 02/26/2021   Gastroesophageal reflux disease 11/20/2019   Major depressive disorder, in remission 03/12/2017   Liver masses 12/06/2016   Recurrent cancer of right breast 12/22/2013   Headache 06/12/2013   Thrombocytopenia 05/22/2013   Malignant neoplasm of lower-inner quadrant of right breast of female, estrogen receptor positive 2012     REFERRING PROVIDER: Gardenia Phlegm, NP  REFERRING DIAG:   C50.311,Z17.0 (ICD-10-CM) - Malignant neoplasm of lower-inner quadrant of right breast of female, estrogen receptor positive ([place])  M54.50,G89.29 (ICD-10-CM) - Chronic midline low back pain without sciatica    Rationale for Evaluation and Treatment Rehabilitation  THERAPY DIAG:  Other low back pain  Abnormal posture  Cervicalgia  Chronic left-sided low back pain, unspecified whether sciatica present  Difficulty in walking, not elsewhere classified  ONSET DATE: chronic  SUBJECTIVE:                                                                                                                                                                                           SUBJECTIVE STATEMENT: Pt states that she has been having good and bad days. She states she is starting to get better but it continues to tightens up again.   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 Yes: NPRS scale: 4-5/10 Pain location: low back Pain description: sore Aggravating factors: constant pain Relieving factors: excedrin tension   PRECAUTIONS: None  WEIGHT BEARING RESTRICTIONS No  FALLS:  Has patient fallen in last 6 months? No  PLOF: Independent  PATIENT GOALS decrease pain   OBJECTIVE:   DIAGNOSTIC FINDINGS:  Oct 2022 MRI- hemangioma at L4, fatty infiltration to sacrum.    TODAY'S TREATMENT   9/13  STM bilat L1-3 lumbar paraspinals STM bilat T9-12 thoracic paraspinals CPA bilat grade III T9-L3  Cat/cow 2x breath cycles 10x  Bird dog 2x10 Super man single UE at a time 10x each side Blue TB hip extension/hip hinge 15x   Trigger Point Dry-Needling  Treatment instructions: Expect mild to moderate muscle soreness. S/S of pneumothorax if dry needled over a lung field, and to seek immediate medical attention should they occur. Patient verbalized understanding of these instructions and education.   Patient Consent Given:  Yes Education (verbally)provided: Yes Muscles treated: T8-10 paraspinals/multifidi Electrical stimulation performed: N/A Treatment response/outcome: significant LTR with spasming and grabbing of needle, pistoning performed very minimally due to significant twitch response  9/6  MANUAL: thoracic & rib mobilizations,  STM to Rt thoraco lumbar paraspinals Prone superman ext x12, fingers behind head Child pose with sidebend Quadruped hip extension & fire hydrant with ball behind knee, 2 rounds x15 each Dead lift with blue tband- cues for glut activaiton   PATIENT EDUCATION:  Education details: Anatomy of condition, POC, HEP, exercise form/rationale Person educated: Patient Education method: Explanation, Demonstration, Tactile cues, Verbal cues, and Handouts Education comprehension: verbalized understanding, returned demonstration, verbal cues required, tactile cues required, and needs further education   HOME EXERCISE PROGRAM: Access Code: [email or web address] URL: [email or web address]   ASSESSMENT:  CLINICAL IMPRESSION: Pt still with signficant spams of lower T/S paraspinals R >L that is likely contributing to L/S pain and stiffness. Pt reports relief of stiffness following session. Pt able to progress to lumbar stability type actiivty today without increasing pain. Pt does have harder time with L glute activation/hip extension as compared to R. If no increased pain from this session, consider working on banded deadlift and teaching hip hinging motion as pt moved with quad dominant motion and unable to fully hip hinge. Pt would benefit from continued skilled therapy in order to reach goals and maximize functional thoracolumbar strength and ROM for prevention of further functional decline.    OBJECTIVE IMPAIRMENTS decreased activity tolerance, difficulty walking, increased muscle spasms, postural dysfunction, and pain.   ACTIVITY LIMITATIONS carrying, lifting, bending, sitting,  standing, squatting, sleeping, stairs, and locomotion level  PARTICIPATION LIMITATIONS: meal prep, cleaning, laundry, driving, shopping, and community activity  PERSONAL FACTORS 1-2 comorbidities: benign growth found in lumbar spine, chronic pain, known LLD  are also affecting patient's functional outcome.   REHAB POTENTIAL: Good  CLINICAL DECISION MAKING: Stable/uncomplicated  EVALUATION COMPLEXITY: Low   GOALS: Goals reviewed with patient? Yes  SHORT TERM GOALS: Target date:12/06/21  Will determine need for heel lift Baseline: Goal status: achieved   LONG TERM GOALS: Target date: 01/10/2022  Bil hip abd strength to meet age appropriate levels per hand held dynamometry Baseline:  Goal status: INITIAL  2.  Able to demonstrate proper form in long term core strength Baseline:  Goal status: INITIAL  3.  Pt will be able to tolerate sitting or standing for 30 minute periods without increase in pain Baseline:  Goal status: INITIAL   PLAN: PT FREQUENCY: 1-2x/week  PT DURATION: 8 weeks  PLANNED INTERVENTIONS: Therapeutic exercises, Therapeutic activity, Neuromuscular re-education, Balance training, Gait training, Patient/Family education, Self Care, Joint mobilization, Stair training, Aquatic Therapy, Spinal mobilization, Cryotherapy, Moist heat, Taping, Manual therapy, and Re-evaluation.  PLAN FOR NEXT SESSION: TPDN PRN, continue with core stability training  Daleen Bo PT, DPT 12/21/21 2:31 PM

## 2021-12-28 ENCOUNTER — Encounter (HOSPITAL_BASED_OUTPATIENT_CLINIC_OR_DEPARTMENT_OTHER): Payer: Self-pay | Admitting: Physical Therapy

## 2021-12-28 ENCOUNTER — Ambulatory Visit (HOSPITAL_BASED_OUTPATIENT_CLINIC_OR_DEPARTMENT_OTHER): Payer: Commercial Managed Care - HMO | Admitting: Physical Therapy

## 2021-12-28 DIAGNOSIS — M5459 Other low back pain: Secondary | ICD-10-CM | POA: Diagnosis not present

## 2021-12-28 DIAGNOSIS — R293 Abnormal posture: Secondary | ICD-10-CM

## 2021-12-28 DIAGNOSIS — M542 Cervicalgia: Secondary | ICD-10-CM

## 2021-12-28 NOTE — Therapy (Signed)
OUTPATIENT PHYSICAL THERAPY THORACOLUMBAR TREATMENT   Patient Name: [name] DOBBINS MRN: [id number] DOB:14-Apr-1970, 51 y.o., female Today's Date: 12/28/2021   PT End of Session - 12/28/21 1109     Visit Number 7    Number of Visits 17    Date for PT Re-Evaluation 01/10/22    Authorization Type [employer]    PT Start Time 1108    PT Stop Time 1149    PT Time Calculation (min) 41 min    Activity Tolerance Patient tolerated treatment well    Behavior During Therapy [facility] for tasks assessed/performed                 Past Medical History:  Diagnosis Date   Abdominal pain, epigastric 11/20/2019   Asthma    as a child   Cervical dysplasia    Gastroesophageal reflux disease 11/20/2019   Genetic testing 02/01/2018   Genetic testing was performed in 2015. The [employer] panel was ordered. In total, 23 genes were analyzed as part of this panel: ATM, BARD1, BRCA1, BRCA2, BRIP1, CDH1, CHEK2, EPCAM, MLH1, MRE11A, MSH2, MSH6, MUTYH, NBN, NF1, PALB2, PMS2, PTEN, RAD50, RAD51C, RAD51D, STK11, and TP53. No pathogenic variants were detected. Genetic testing did detect a Variant of Unknown Significance (VUS) at the t   GERD (gastroesophageal reflux disease)    had during chemo   Headache 06/12/2013   History of chemotherapy    History of kidney stones    History of radiation therapy 11/24/13- 01/08/14   reconstructed right breast/chest wall 4500 cGy 25 sessions, electron beam boost 1440 cGy 8 sessions   Liver masses 12/06/2016   Major depressive disorder, in remission 03/12/2017   Malignant neoplasm of lower-inner quadrant of right breast of female, estrogen receptor positive 2012   Pneumonia    PONV (postoperative nausea and vomiting)    Recurrent cancer of right breast 12/22/2013   Thrombocytopenia 05/22/2013   Past Surgical History:  Procedure Laterality Date   ADENOIDECTOMY     BREAST LUMPECTOMY WITH NEEDLE LOCALIZATION Right 05/05/2013   Procedure: EXCISION RECURRENT CANCER RIGHT BREAST  WITH NEEDLE LOCALOZATION;  Surgeon: Adin Hector, MD;  Location: [place];  Service: General;  Laterality: Right;   BREAST RECONSTRUCTION  06/29/2011   Procedure: BREAST RECONSTRUCTION;  Surgeon: Theodoro Kos, DO;  Location: [place];  Service: Plastics;  Laterality: Bilateral;  Immediate Bilateral Breast Reconstruction with Bilateral Tissue Expanders and placement of  Alloderm    DILATION AND CURETTAGE OF UTERUS     fibroid tumor surgery     lazy eye     corrective surgery   MASTECTOMY W/ SENTINEL NODE BIOPSY  06/29/2011   Procedure: MASTECTOMY WITH SENTINEL LYMPH NODE BIOPSY;  Surgeon: Adin Hector, MD;  Location: [place];  Service: General;  Laterality: Right;  right skin spairing mstectomy and right sentinel lymph node biopsy   PORT-A-CATH REMOVAL  03/12/2012   Procedure: MINOR REMOVAL PORT-A-CATH;  Surgeon: Adin Hector, MD;  Location: [place];  Service: General;  Laterality: N/A;  Upper left   PORTACATH PLACEMENT Left 05/05/2013   Procedure: INSERTION PORT-A-CATH;  Surgeon: Adin Hector, MD;  Location: [place];  Service: General;  Laterality: Left;   RHINOPLASTY     uterine cervical treatments  2002   for precancerous lesions   Patient Active Problem List   Diagnosis Date Noted   Snoring 07/25/2021   Excessive daytime sleepiness 07/25/2021   Routine general medical examination at a health care facility 07/08/2021  Positive ANA (antinuclear antibody) 03/18/2021   Left leg pain 03/18/2021   Arthralgia 03/11/2021   Back pain 02/26/2021   Neck pain 02/26/2021   Gastroesophageal reflux disease 11/20/2019   Major depressive disorder, in remission 03/12/2017   Liver masses 12/06/2016   Recurrent cancer of right breast 12/22/2013   Headache 06/12/2013   Thrombocytopenia 05/22/2013   Malignant neoplasm of lower-inner quadrant of right breast of female, estrogen receptor positive 2012     REFERRING PROVIDER: Gardenia Phlegm, NP  REFERRING DIAG:   C50.311,Z17.0 (ICD-10-CM) - Malignant neoplasm of lower-inner quadrant of right breast of female, estrogen receptor positive ([place])  M54.50,G89.29 (ICD-10-CM) - Chronic midline low back pain without sciatica    Rationale for Evaluation and Treatment Rehabilitation  THERAPY DIAG:  Other low back pain  Abnormal posture  Cervicalgia  ONSET DATE: chronic  SUBJECTIVE:                                                                                                                                                                                           SUBJECTIVE STATEMENT: It seems like a few days before it's time to come here I start hurting again. I did the dishes and was washing some eggs and started feeling my lower back burning after about 10-15 min.   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 Yes: NPRS scale: 4-5/10 Pain location: low back Pain description: sore Aggravating factors: constant pain Relieving factors: excedrin tension   PRECAUTIONS: None  WEIGHT BEARING RESTRICTIONS No  FALLS:  Has patient fallen in last 6 months? No  PLOF: Independent  PATIENT GOALS decrease pain   OBJECTIVE:   DIAGNOSTIC FINDINGS:  Oct 2022 MRI- hemangioma at L4, fatty infiltration to sacrum.    TODAY'S TREATMENT   9/20: Shuttle press upright 3*25 Hip hinge with dowel- added stepping fwd/retro/lateral Hip hinge with row- cues for scap retraction ragdoll  9/13  STM bilat L1-3 lumbar paraspinals STM bilat T9-12 thoracic paraspinals CPA bilat grade III T9-L3  Cat/cow 2x breath cycles 10x  Bird dog 2x10 Super man single UE at a time 10x each side Blue TB hip extension/hip hinge 15x   Trigger Point Dry-Needling  Treatment instructions: Expect mild to moderate muscle soreness. S/S of pneumothorax if dry needled over a lung field, and to seek immediate medical attention should they occur. Patient verbalized  understanding of these instructions and education.   Patient Consent Given: Yes Education (verbally)provided: Yes Muscles treated: T8-10 paraspinals/multifidi Electrical stimulation performed: N/A Treatment response/outcome: significant LTR with spasming and grabbing of needle, pistoning performed  very minimally due to significant twitch response    PATIENT EDUCATION:  Education details: Anatomy of condition, POC, HEP, exercise form/rationale Person educated: Patient Education method: Explanation, Demonstration, Tactile cues, Verbal cues, and Handouts Education comprehension: verbalized understanding, returned demonstration, verbal cues required, tactile cues required, and needs further education   HOME EXERCISE PROGRAM: Access Code: [email or web address] URL: [email or web address]   ASSESSMENT:  CLINICAL IMPRESSION: Burning symptoms in lumbar spine consistent with reaching end of endurance for lumbar musculature. Required multiple cues for form in hip hinge and control from gluts. We spent some time today discussing her feeling discouraged and reviewing how much progress she has made since beginning PT.     OBJECTIVE IMPAIRMENTS decreased activity tolerance, difficulty walking, increased muscle spasms, postural dysfunction, and pain.   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   REHAB POTENTIAL: Good  CLINICAL DECISION MAKING: Stable/uncomplicated  EVALUATION COMPLEXITY: Low   GOALS: Goals reviewed with patient? Yes  SHORT TERM GOALS: Target date:12/06/21  Will determine need for heel lift Baseline: Goal status: achieved   LONG TERM GOALS:  Target date: 01/10/2022  Bil hip abd strength to meet age appropriate levels per hand held dynamometry Baseline:  Goal status: INITIAL  2.  Able to demonstrate proper form in long term core strength Baseline:  Goal status: INITIAL  3.  Pt will be able to tolerate sitting or standing for 30 minute periods without increase in pain Baseline:  Goal status: INITIAL   PLAN: PT FREQUENCY: 1-2x/week  PT DURATION: 8 weeks  PLANNED INTERVENTIONS: Therapeutic exercises, Therapeutic activity, Neuromuscular re-education, Balance training, Gait training, Patient/Family education, Self Care, Joint mobilization, Stair training, Aquatic Therapy, Spinal mobilization, Cryotherapy, Moist heat, Taping, Manual therapy, and Re-evaluation.  PLAN FOR NEXT SESSION: TPDN PRN, continue with core stability training, hip hinge, increase resistance load Jessica C. Hightower PT, DPT 12/28/21 1:56 PM

## 2022-01-04 ENCOUNTER — Ambulatory Visit (HOSPITAL_BASED_OUTPATIENT_CLINIC_OR_DEPARTMENT_OTHER): Payer: Commercial Managed Care - HMO | Admitting: Physical Therapy

## 2022-01-04 ENCOUNTER — Encounter (HOSPITAL_BASED_OUTPATIENT_CLINIC_OR_DEPARTMENT_OTHER): Payer: Self-pay | Admitting: Physical Therapy

## 2022-01-04 DIAGNOSIS — R293 Abnormal posture: Secondary | ICD-10-CM

## 2022-01-04 DIAGNOSIS — M5459 Other low back pain: Secondary | ICD-10-CM

## 2022-01-04 DIAGNOSIS — M542 Cervicalgia: Secondary | ICD-10-CM

## 2022-01-04 NOTE — Therapy (Signed)
OUTPATIENT PHYSICAL THERAPY THORACOLUMBAR TREATMENT   Patient Name: [name] MRN: [id number] DOB:[birth year], 51 y.o., female Today's Date: 01/04/2022   PT End of Session - 01/04/22 1442     Visit Number 8    Number of Visits 17    Date for PT Re-Evaluation 01/10/22    Authorization Type [employer]    PT Start Time 1355    PT Stop Time 1426    PT Time Calculation (min) 31 min    Activity Tolerance Patient tolerated treatment well    Behavior During Therapy [facility] for tasks assessed/performed                  Past Medical History:  Diagnosis Date   Abdominal pain, epigastric 11/20/2019   Asthma    as a child   Cervical dysplasia    Gastroesophageal reflux disease 11/20/2019   Genetic testing 02/01/2018   Genetic testing was performed in 2015. The [employer] O[employer] panel was ordered. In total, 23 genes were analyzed as part of this panel: ATM, BARD1, BRCA1, BRCA2, BRIP1, CDH1, CHEK2, EPCAM, MLH1, MRE11A, MSH2, MSH6, MUTYH, NBN, NF1, PALB2, PMS2, PTEN, RAD50, RAD51C, RAD51D, STK11, and TP53. No pathogenic variants were detected. Genetic testing did detect a Variant of Unknown Significance (VUS) at the t   GERD (gastroesophageal reflux disease)    had during chemo   Headache 06/12/2013   History of chemotherapy    History of kidney stones    History of radiation therapy 11/24/13- 01/08/14   reconstructed right breast/chest wall 4500 cGy 25 sessions, electron beam boost 1440 cGy 8 sessions   Liver masses 12/06/2016   Major depressive disorder, in remission 03/12/2017   Malignant neoplasm of lower-inner quadrant of right breast of female, estrogen receptor positive 2012   Pneumonia    PONV (postoperative nausea and vomiting)    Recurrent cancer of right breast 12/22/2013   Thrombocytopenia 05/22/2013   Past Surgical History:  Procedure Laterality Date   ADENOIDECTOMY     BREAST LUMPECTOMY WITH NEEDLE LOCALIZATION Right 05/05/2013   Procedure: EXCISION RECURRENT CANCER RIGHT BREAST  WITH NEEDLE LOCALOZATION;  Surgeon: Adin Hector, MD;  Location: [place];  Service: General;  Laterality: Right;   BREAST RECONSTRUCTION  06/29/2011   Procedure: BREAST RECONSTRUCTION;  Surgeon: Theodoro Kos, DO;  Location: [place];  Service: Plastics;  Laterality: Bilateral;  Immediate Bilateral Breast Reconstruction with Bilateral Tissue Expanders and placement of  Alloderm    DILATION AND CURETTAGE OF UTERUS     fibroid tumor surgery     lazy eye     corrective surgery   MASTECTOMY W/ SENTINEL NODE BIOPSY  06/29/2011   Procedure: MASTECTOMY WITH SENTINEL LYMPH NODE BIOPSY;  Surgeon: Adin Hector, MD;  Location: [place];  Service: General;  Laterality: Right;  right skin spairing mstectomy and right sentinel lymph node biopsy   PORT-A-CATH REMOVAL  03/12/2012   Procedure: MINOR REMOVAL PORT-A-CATH;  Surgeon: Adin Hector, MD;  Location: [place];  Service: General;  Laterality: N/A;  Upper left   PORTACATH PLACEMENT Left 05/05/2013   Procedure: INSERTION PORT-A-CATH;  Surgeon: Adin Hector, MD;  Location: [place];  Service: General;  Laterality: Left;   RHINOPLASTY     uterine cervical treatments  2002   for precancerous lesions   Patient Active Problem List   Diagnosis Date Noted   Snoring 07/25/2021   Excessive daytime sleepiness 07/25/2021   Routine general medical examination at a health care facility  07/08/2021   Positive ANA (antinuclear antibody) 03/18/2021   Left leg pain 03/18/2021   Arthralgia 03/11/2021   Back pain 02/26/2021   Neck pain 02/26/2021   Gastroesophageal reflux disease 11/20/2019   Major depressive disorder, in remission 03/12/2017   Liver masses 12/06/2016   Recurrent cancer of right breast 12/22/2013   Headache 06/12/2013   Thrombocytopenia 05/22/2013   Malignant neoplasm of lower-inner quadrant of right breast of female, estrogen receptor positive 2012     REFERRING PROVIDER: Gardenia Phlegm, NP  REFERRING DIAG:   C50.311,Z17.0 (ICD-10-CM) - Malignant neoplasm of lower-inner quadrant of right breast of female, estrogen receptor positive ([place])  M54.50,G89.29 (ICD-10-CM) - Chronic midline low back pain without sciatica    Rationale for Evaluation and Treatment Rehabilitation  THERAPY DIAG:  Other low back pain  Abnormal posture  Cervicalgia  ONSET DATE: chronic  SUBJECTIVE:                                                                                                                                                                                           SUBJECTIVE STATEMENT: Pt states she is sore after last session and felt a little better but is sore again today. Pt states she would like to the review the hinging motions today.   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 Yes: NPRS scale: 5-6/10 Pain location: low back Pain description: sore Aggravating factors: constant pain Relieving factors: excedrin tension   PRECAUTIONS: None  WEIGHT BEARING RESTRICTIONS No  FALLS:  Has patient fallen in last 6 months? No  PLOF: Independent  PATIENT GOALS decrease pain   OBJECTIVE:   DIAGNOSTIC FINDINGS:  Oct 2022 MRI- hemangioma at L4, fatty infiltration to sacrum.    TODAY'S TREATMENT   9/27  Nu-step L3 5 min Hip hinge at wall 5lbs 2x10 Rag doll/jefferson curl standing at wall 2x5 Sidestepping band at ankles YTB 76f x3; retro step with band at ankle 2x 5lb cable Paloff rotations 10x each way    9/20: Shuttle press upright 3*25 Hip hinge with dowel- added stepping fwd/retro/lateral Hip hinge with row- cues for scap retraction ragdoll  9/13  STM bilat L1-3 lumbar paraspinals STM bilat T9-12 thoracic paraspinals CPA bilat grade III T9-L3  Cat/cow 2x breath cycles 10x  Bird dog 2x10 Super man single UE at a time 10x each side Blue TB hip extension/hip hinge 15x   Trigger Point Dry-Needling  Treatment  instructions: Expect mild to moderate muscle soreness. S/S of pneumothorax if dry needled over a lung field, and to seek immediate medical attention  should they occur. Patient verbalized understanding of these instructions and education.   Patient Consent Given: Yes Education (verbally)provided: Yes Muscles treated: T8-10 paraspinals/multifidi Electrical stimulation performed: N/A Treatment response/outcome: significant LTR with spasming and grabbing of needle, pistoning performed very minimally due to significant twitch response    PATIENT EDUCATION:  Education details: Anatomy of condition, POC, HEP, exercise form/rationale Person educated: Patient Education method: Explanation, Demonstration, Tactile cues, Verbal cues, and Handouts Education comprehension: verbalized understanding, returned demonstration, verbal cues required, tactile cues required, and needs further education   HOME EXERCISE PROGRAM: Access Code: [email or web address] URL: [email or web address]   ASSESSMENT:  CLINICAL IMPRESSION: Pt able to continue with progression of functional loading exercise today without need for pre-exercise pain management. Pt required review of hip hinge with external cuing in order to coordinate and sequence motion correctly. Pt has tendency to favor knee bending motion or lumbar extension instead of hip movement. Pt was able to incorporate sidestepping and retro stepping with resistance into HEP, verbally added. Consider higher volume loading at next session to aid in muscle endurance. Plan to continue with lumbopelvic strengthening as tolerated at future sessions.     OBJECTIVE IMPAIRMENTS decreased activity tolerance, difficulty walking, increased muscle spasms, postural dysfunction, and pain.   ACTIVITY LIMITATIONS carrying, lifting, bending, sitting, standing, squatting, sleeping, stairs, and locomotion level  PARTICIPATION LIMITATIONS: meal prep, cleaning, laundry, driving,  shopping, and community activity  PERSONAL FACTORS 1-2 comorbidities: benign growth found in lumbar spine, chronic pain, known LLD  are also affecting patient's functional outcome.   REHAB POTENTIAL: Good  CLINICAL DECISION MAKING: Stable/uncomplicated  EVALUATION COMPLEXITY: Low   GOALS:  SHORT TERM GOALS: Target date:12/06/21  Will determine need for heel lift Baseline: Goal status: achieved   LONG TERM GOALS: Target date: 01/10/2022  Bil hip abd strength to meet age appropriate levels per hand held dynamometry Baseline:  Goal status: INITIAL  2.  Able to demonstrate proper form in long term core strength Baseline:  Goal status: INITIAL  3.  Pt will be able to tolerate sitting or standing for 30 minute periods without increase in pain Baseline:  Goal status: INITIAL   PLAN: PT FREQUENCY: 1-2x/week  PT DURATION: 8 weeks  PLANNED INTERVENTIONS: Therapeutic exercises, Therapeutic activity, Neuromuscular re-education, Balance training, Gait training, Patient/Family education, Self Care, Joint mobilization, Stair training, Aquatic Therapy, Spinal mobilization, Cryotherapy, Moist heat, Taping, Manual therapy, and Re-evaluation.  PLAN FOR NEXT SESSION: TPDN PRN, continue with core stability training, hip hinge, increase resistance load   Daleen Bo PT, DPT 01/04/22 2:43 PM

## 2022-01-10 ENCOUNTER — Ambulatory Visit (HOSPITAL_BASED_OUTPATIENT_CLINIC_OR_DEPARTMENT_OTHER): Payer: Commercial Managed Care - HMO | Attending: Orthopaedic Surgery | Admitting: Physical Therapy

## 2022-01-10 ENCOUNTER — Encounter (HOSPITAL_BASED_OUTPATIENT_CLINIC_OR_DEPARTMENT_OTHER): Payer: Self-pay | Admitting: Physical Therapy

## 2022-01-10 DIAGNOSIS — G43009 Migraine without aura, not intractable, without status migrainosus: Secondary | ICD-10-CM | POA: Diagnosis present

## 2022-01-10 DIAGNOSIS — M549 Dorsalgia, unspecified: Secondary | ICD-10-CM | POA: Diagnosis present

## 2022-01-10 DIAGNOSIS — M5459 Other low back pain: Secondary | ICD-10-CM | POA: Diagnosis not present

## 2022-01-10 DIAGNOSIS — M5412 Radiculopathy, cervical region: Secondary | ICD-10-CM | POA: Insufficient documentation

## 2022-01-10 DIAGNOSIS — M542 Cervicalgia: Secondary | ICD-10-CM | POA: Diagnosis present

## 2022-01-10 DIAGNOSIS — M6281 Muscle weakness (generalized): Secondary | ICD-10-CM | POA: Insufficient documentation

## 2022-01-10 DIAGNOSIS — G8929 Other chronic pain: Secondary | ICD-10-CM | POA: Diagnosis present

## 2022-01-10 DIAGNOSIS — R293 Abnormal posture: Secondary | ICD-10-CM | POA: Insufficient documentation

## 2022-01-10 NOTE — Therapy (Signed)
OUTPATIENT PHYSICAL THERAPY THORACOLUMBAR TREATMENT   Patient Name: [name] MRN: [id number] DOB:[birth year], 51 y.o., female Today's Date: 01/10/2022   PT End of Session - 01/10/22 1339     Visit Number 9    Number of Visits 17    Date for PT Re-Evaluation 01/10/22    Authorization Type [employer]    PT Start Time 1308   late   PT Stop Time 1340    PT Time Calculation (min) 32 min    Activity Tolerance Patient tolerated treatment well    Behavior During Therapy [facility] for tasks assessed/performed                   Past Medical History:  Diagnosis Date   Abdominal pain, epigastric 11/20/2019   Asthma    as a child   Cervical dysplasia    Gastroesophageal reflux disease 11/20/2019   Genetic testing 02/01/2018   Genetic testing was performed in 2015. The [employer] panel was ordered. In total, 23 genes were analyzed as part of this panel: ATM, BARD1, BRCA1, BRCA2, BRIP1, CDH1, CHEK2, EPCAM, MLH1, MRE11A, MSH2, MSH6, MUTYH, NBN, NF1, PALB2, PMS2, PTEN, RAD50, RAD51C, RAD51D, STK11, and TP53. No pathogenic variants were detected. Genetic testing did detect a Variant of Unknown Significance (VUS) at the t   GERD (gastroesophageal reflux disease)    had during chemo   Headache 06/12/2013   History of chemotherapy    History of kidney stones    History of radiation therapy 11/24/13- 01/08/14   reconstructed right breast/chest wall 4500 cGy 25 sessions, electron beam boost 1440 cGy 8 sessions   Liver masses 12/06/2016   Major depressive disorder, in remission 03/12/2017   Malignant neoplasm of lower-inner quadrant of right breast of female, estrogen receptor positive 2012   Pneumonia    PONV (postoperative nausea and vomiting)    Recurrent cancer of right breast 12/22/2013   Thrombocytopenia 05/22/2013   Past Surgical History:  Procedure Laterality Date   ADENOIDECTOMY     BREAST LUMPECTOMY WITH NEEDLE LOCALIZATION Right 05/05/2013   Procedure: EXCISION RECURRENT CANCER  RIGHT BREAST WITH NEEDLE LOCALOZATION;  Surgeon: Adin Hector, MD;  Location: [place];  Service: General;  Laterality: Right;   BREAST RECONSTRUCTION  06/29/2011   Procedure: BREAST RECONSTRUCTION;  Surgeon: Theodoro Kos, DO;  Location: [place];  Service: Plastics;  Laterality: Bilateral;  Immediate Bilateral Breast Reconstruction with Bilateral Tissue Expanders and placement of  Alloderm    DILATION AND CURETTAGE OF UTERUS     fibroid tumor surgery     lazy eye     corrective surgery   MASTECTOMY W/ SENTINEL NODE BIOPSY  06/29/2011   Procedure: MASTECTOMY WITH SENTINEL LYMPH NODE BIOPSY;  Surgeon: Adin Hector, MD;  Location: [place];  Service: General;  Laterality: Right;  right skin spairing mstectomy and right sentinel lymph node biopsy   PORT-A-CATH REMOVAL  03/12/2012   Procedure: MINOR REMOVAL PORT-A-CATH;  Surgeon: Adin Hector, MD;  Location: [place];  Service: General;  Laterality: N/A;  Upper left   PORTACATH PLACEMENT Left 05/05/2013   Procedure: INSERTION PORT-A-CATH;  Surgeon: Adin Hector, MD;  Location: [place];  Service: General;  Laterality: Left;   RHINOPLASTY     uterine cervical treatments  2002   for precancerous lesions   Patient Active Problem List   Diagnosis Date Noted   Snoring 07/25/2021   Excessive daytime sleepiness 07/25/2021   Routine general medical examination at a  health care facility 07/08/2021   Positive ANA (antinuclear antibody) 03/18/2021   Left leg pain 03/18/2021   Arthralgia 03/11/2021   Back pain 02/26/2021   Neck pain 02/26/2021   Gastroesophageal reflux disease 11/20/2019   Major depressive disorder, in remission 03/12/2017   Liver masses 12/06/2016   Recurrent cancer of right breast 12/22/2013   Headache 06/12/2013   Thrombocytopenia 05/22/2013   Malignant neoplasm of lower-inner quadrant of right breast of female, estrogen receptor positive 2012     REFERRING PROVIDER: Gardenia Phlegm, NP  REFERRING  DIAG:  C50.311,Z17.0 (ICD-10-CM) - Malignant neoplasm of lower-inner quadrant of right breast of female, estrogen receptor positive ([place])  M54.50,G89.29 (ICD-10-CM) - Chronic midline low back pain without sciatica    Rationale for Evaluation and Treatment Rehabilitation  THERAPY DIAG:  Other low back pain  Abnormal posture  ONSET DATE: chronic  SUBJECTIVE:                                                                                                                                                                                           SUBJECTIVE STATEMENT: Pt states she was sore for 1-2 days after last session but the last week has actually improved. She states her back feels a little better.  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 Yes: NPRS scale: 3/10 Pain location: low back Pain description: sore Aggravating factors: constant pain Relieving factors: excedrin tension   PRECAUTIONS: None  WEIGHT BEARING RESTRICTIONS No  FALLS:  Has patient fallen in last 6 months? No  PLOF: Independent  PATIENT GOALS decrease pain   OBJECTIVE:   DIAGNOSTIC FINDINGS:  Oct 2022 MRI- hemangioma at L4, fatty infiltration to sacrum.    TODAY'S TREATMENT   10/3  Nu-step L3 5 min Bird dog 2x10 Hip hinge at wall 5lbs 2x10 Jefferson  curl 5lbs 3x6 - Cat Cow  - 2 x daily - 7 x weekly - 1 sets - 10 reps - 2 hold - Child's Pose with Sidebending  - 1 x daily - 7 x weekly - 3 sets - 3 breaths hold - Standing Anti-Rotation Press with Anchored Resistance  - 1 x daily - 3 x weekly - 2 sets - 10 reps - Side Stepping with Resistance at Thighs  - 1 x daily - 3 x weekly - 1 sets - 3 reps - 49f hold   9/27  Nu-step L3 5 min Hip hinge at wall 5lbs 2x10 Rag doll/jefferson curl standing at wall 2x5 Sidestepping band at ankles YTB [id number]; retro step with band at ankle  2x 5lb cable Paloff rotations 10x each  way    9/20: Shuttle press upright 3*25 Hip hinge with dowel- added stepping fwd/retro/lateral Hip hinge with row- cues for scap retraction ragdoll  9/13  STM bilat L1-3 lumbar paraspinals STM bilat T9-12 thoracic paraspinals CPA bilat grade III T9-L3  Cat/cow 2x breath cycles 10x  Bird dog 2x10 Super man single UE at a time 10x each side Blue TB hip extension/hip hinge 15x   Trigger Point Dry-Needling  Treatment instructions: Expect mild to moderate muscle soreness. S/S of pneumothorax if dry needled over a lung field, and to seek immediate medical attention should they occur. Patient verbalized understanding of these instructions and education.   Patient Consent Given: Yes Education (verbally)provided: Yes Muscles treated: T8-10 paraspinals/multifidi Electrical stimulation performed: N/A Treatment response/outcome: significant LTR with spasming and grabbing of needle, pistoning performed very minimally due to significant twitch response    PATIENT EDUCATION:  Education details: Anatomy of condition, POC, HEP, exercise form/rationale Person educated: Patient Education method: Explanation, Demonstration, Tactile cues, Verbal cues, and Handouts Education comprehension: verbalized understanding, returned demonstration, verbal cues required, tactile cues required, and needs further education   HOME EXERCISE PROGRAM: Access Code: [email or web address] URL: [email or web address]   ASSESSMENT:  CLINICAL IMPRESSION: Pt able to continue with progression of functional loading exercise today and able to increase weight with CKC/hip hinging motions. Pt still requires cuing for ab brace and neutral spine with hip hinging. Pt is able to correct movement with wall as ROM stop. Pt session focused on reducing fear of lumbar movements and progressively loading in pain free ROM. HEP updated today and trimmed to aid in compliance. Plan to do re-cert at next visit. Consider higher volume  loading at next session to aid in muscle endurance. Plan to continue with lumbopelvic strengthening as tolerated at future sessions.     OBJECTIVE IMPAIRMENTS decreased activity tolerance, difficulty walking, increased muscle spasms, postural dysfunction, and pain.   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   REHAB POTENTIAL: Good  CLINICAL DECISION MAKING: Stable/uncomplicated  EVALUATION COMPLEXITY: Low   GOALS:  SHORT TERM GOALS: Target date:12/06/21  Will determine need for heel lift Baseline: Goal status: achieved   LONG TERM GOALS: Target date: 01/10/2022  Bil hip abd strength to meet age appropriate levels per hand held dynamometry Baseline:  Goal status: INITIAL  2.  Able to demonstrate proper form in long term core strength Baseline:  Goal status: INITIAL  3.  Pt will be able to tolerate sitting or standing for 30 minute periods without increase in pain Baseline:  Goal status: INITIAL   PLAN: PT FREQUENCY: 1-2x/week  PT DURATION: 8 weeks  PLANNED INTERVENTIONS: Therapeutic exercises, Therapeutic activity, Neuromuscular re-education, Balance training, Gait training, Patient/Family education, Self Care, Joint mobilization, Stair training, Aquatic Therapy, Spinal mobilization, Cryotherapy, Moist heat, Taping, Manual therapy, and Re-evaluation.  PLAN FOR NEXT SESSION: TPDN PRN, continue with core stability training, hip hinge, increase resistance load; recert   Daleen Bo PT, DPT 01/10/22 1:46 PM

## 2022-01-10 NOTE — Progress Notes (Signed)
NEUROLOGY FOLLOW UP OFFICE NOTE  [name] [id number]  Assessment/Plan:   1  Migraine without aura, without status migrainosus, not intractable - probably cervicogenic 2  Cervical radiculopathy 3  Low back pain with bilateral radiculopathy   Increase nortriptyline to 17m at bedtime. Migraine rescue:  Ubrelvy 1016mMethocarbamol 50047mp to 3 times daily Undergoing physical therapy.  She would like a referral back to Dr. [facility] to further address neck and back pain. Limit use of pain relievers to no more than 2 days out of week to prevent risk of rebound or medication-overuse headache. Keep headache diary Follow up 4-5 months.     Subjective:  [name] a 51 57ar old right-handed female with history of breast cancer s/p bilateral mastectomies, radiation and chemotherapy who follows up for migraines and memory deficits.   UPDATE: Last visit, started nortriptyline to address both headache and radicular pain.  Intensity:  Severe Duration:  2 hours with UbrRoselyn Meierequency:  5 to 10 a month With frequent neck and back pain   Current NSAIDS:  Excedrin Tension Current analgesics:  none Current triptans:  rizatriptan 74m69mrrent ergotamine:  none Current anti-emetic:  none Current muscle relaxants:  Robaxin 500mg85m, Flexeril 74mg 50mPRN Current anti-anxiolytic:  none Current sleep aide:  none Current Antihypertensive medications:  none Current Antidepressant medications:  nortriptyline 74mg a73mdtime Current Anticonvulsant medications:  none Current anti-CGRP:  Ubrelvy 100mg Cu28mt Vitamins/Herbal/Supplements:  Co-Q10 150mg dai58mD, biotin, turmeric curcumin Current Antihistamines/Decongestants:  none Other therapy:  Ice pack on back of neck and over eyes. Hormone/birth control:  Vitafol Fe   Caffeine:  No coffee. Mt Dew rarely Diet:  Drinks plenty of water.  Not a big eater.  Eats protein bars, fruits and vegetables.  Sometimes  a doughnut. Exercise:  Active but not routine Mood:  Does not feel depressed but not as happy as she used to be. Other pain:  Low back pain sometimes radiating down either/both legs.  Has seen orthopedics and pain management.      HISTORY:  She has had headaches since her early 40s.     2s has history of recurrent breast cancer (2012 and 2015) status post bilateral mastectomies, radiating and chemotherapy.  She is currently in remission without evidence of disease.  She developed severe headaches during her treatment in 2015.  She has had headaches since then.  Often, she wakes up with pressure-like headaches (pulsating in temples).  Sometimes they may occur later in the day.  Sometimes they are moderate if they occur later in the day, but otherwise severe, either at base of her skull radiating up to the front and in the temples.  No neck pain.  She has associated photophobia, phonophobia, rarely nausea, no associated visual disturbance.  They last usually a couple of hours but may last 1/2 a day.  Severe headaches occur about 10 days a months.  She takes Excedrin daily, which helps.  No specific trigger.  She does report degenerative disc disease in her cervical spine.  Changing pillows ineffective.  MRI of brain with and without contrast on 03/29/2016 personally reviewed and was unremarkable.  CT soft tissue of neck from 12/29/15 showed C6-7 disc degeneration with severe left neural foraminal stenosis.  She has episodes of dizziness when she is bent over and then stands up.  She also reports short term memory problems.  She reports daytime fatigue and endorses that she is a "mouth breather".  TSH was 0.952.  MRI of brain with and without contrast on 12/22/2019 was normal.  Neuropsychological testing in October 2021 demonstrated no cognitive deficits.  She began experiencing pain and numbness into the left shoulder, arm and hand.  Repeat MRI of cervical spine on 01/23/2021 personally reviewed showed  leftward disc osteophyte complex and spurring causing severe left foraminal stenosis at C6-7 as well as mild disc bulging and spurring at C3-4 and C4-5 without significant stenosis.     Past NSAIDS:  Ibuprofen, naproxen Past analgesics:  Tylenol, Tylenol #3 Past abortive triptans:  rizatriptan 63m (upset stomach) Past abortive ergotamine:  none Past muscle relaxants:  Robaxin, Flexeril Past anti-emetic:  none Past antihypertensive medications:  none Past antidepressant medications:  venlafaxine XR 727m(side effects), Cymbalta Past anticonvulsant medications:  gabapentin Past anti-CGRP:  none Past vitamins/Herbal/Supplements:  none Past antihistamines/decongestants:  none Other past therapies:  none  PAST MEDICAL HISTORY: Past Medical History:  Diagnosis Date   Abdominal pain, epigastric 11/20/2019   Asthma    as a child   Cervical dysplasia    Gastroesophageal reflux disease 11/20/2019   Genetic testing 02/01/2018   Genetic testing was performed in 2015. The [employer] O[employer] panel was ordered. In total, 23 genes were analyzed as part of this panel: ATM, BARD1, BRCA1, BRCA2, BRIP1, CDH1, CHEK2, EPCAM, MLH1, MRE11A, MSH2, MSH6, MUTYH, NBN, NF1, PALB2, PMS2, PTEN, RAD50, RAD51C, RAD51D, STK11, and TP53. No pathogenic variants were detected. Genetic testing did detect a Variant of Unknown Significance (VUS) at the t   GERD (gastroesophageal reflux disease)    had during chemo   Headache 06/12/2013   History of chemotherapy    History of kidney stones    History of radiation therapy 11/24/13- 01/08/14   reconstructed right breast/chest wall 4500 cGy 25 sessions, electron beam boost 1440 cGy 8 sessions   Liver masses 12/06/2016   Major depressive disorder, in remission 03/12/2017   Malignant neoplasm of lower-inner quadrant of right breast of female, estrogen receptor positive 2012   Pneumonia    PONV (postoperative nausea and vomiting)    Recurrent cancer of right breast 12/22/2013    Thrombocytopenia 05/22/2013    MEDICATIONS: Current Outpatient Medications on File Prior to Visit  Medication Sig Dispense Refill   aspirin-acetaminophen-caffeine (EXCEDRIN MIGRAINE) 250-250-65 MG tablet Take by mouth every 6 (six) hours as needed for headache.     Biotin 5000 MCG CAPS Take 5,000 mcg by mouth daily.     Black Pepper-Turmeric (TURMERIC CURCUMIN) 08-998 MG CAPS Take by mouth 2 (two) times daily.     Cholecalciferol (VITAMIN D3) 125 MCG (5000 UT) TBDP Take 1 tablet by mouth daily.     Coenzyme Q10 (COQ10) 150 MG CAPS Take 1 capsule by mouth once.     finasteride (PROSCAR) 5 MG tablet Take 5 mg by mouth daily. 1/2 tablet daily     lidocaine-prilocaine (EMLA) cream Apply 1 application topically as needed. 100 g 3   nortriptyline (PAMELOR) 10 MG capsule Take 1 capsule (10 mg total) by mouth at bedtime. 30 capsule 5   Prenat-Fe Poly-Methfol-FA-DHA (VITAFOL FE+) 90-0.6-0.4-200 MG CAPS Take 1 tablet by mouth daily. 30 capsule 11   Ubrogepant (UBRELVY) 100 MG TABS Take 1 tablet by mouth as needed (May repeat in 2 hours.  maximum 2 tablets in 24 hours.). 16 tablet 5   No current facility-administered medications on file prior to visit.    ALLERGIES: Allergies  Allergen Reactions   Tussionex Pennkinetic Er [Hydrocod Poli-Chlorphe Poli  Er] Other (See Comments)    hallucinations   Morphine Rash   Morphine And Related Anxiety    anxiety   Penicillin G Rash   Penicillins Rash    FAMILY HISTORY: Family History  Adopted: Yes  Problem Relation Age of Onset   Heart attack Father    Cancer Cousin 18       melanoma; mat first cousin, located on neck   Colon cancer Mother 76   Rectal cancer Mother    Cancer Maternal Aunt 60       stomach and ovarian as separate primaries   Cancer Maternal Uncle 71       lung; smoker   Cancer Maternal Uncle 63       prostate   Cancer Maternal Grandmother 75       enodometrial   Cancer Maternal Aunt 55       breast   Stomach cancer Maternal  Aunt       Objective:  Blood pressure 90/64, pulse 92, height 5' 3"  (1.6 m), weight 126 lb (57.2 kg), last menstrual period 03/11/2011, SpO2 98 %. General: No acute distress.  Patient appears well-groomed.   Head:  Normocephalic/atraumatic Eyes:  Fundi examined but not visualized Neck: supple, bilateral paraspinal tenderness, full range of motion Heart:  Regular rate and rhythm Neurological Exam: alert and oriented to person, place, and time.  Speech fluent and not dysarthric, language intact.  CN II-XII intact. Bulk and tone normal, muscle strength 5/5 throughout.  Sensation to light touch intact.  Deep tendon reflexes 2+ throughout, toes downgoing.  Finger to nose testing intact.  Gait normal, Romberg negative.   Metta Clines, DO  CC: Pricilla Holm, MD

## 2022-01-13 ENCOUNTER — Encounter: Payer: Self-pay | Admitting: Neurology

## 2022-01-13 ENCOUNTER — Ambulatory Visit (INDEPENDENT_AMBULATORY_CARE_PROVIDER_SITE_OTHER): Payer: Commercial Managed Care - HMO | Admitting: Neurology

## 2022-01-13 VITALS — BP 90/64 | HR 92 | Ht 63.0 in | Wt 126.0 lb

## 2022-01-13 DIAGNOSIS — G43009 Migraine without aura, not intractable, without status migrainosus: Secondary | ICD-10-CM | POA: Diagnosis not present

## 2022-01-13 DIAGNOSIS — M549 Dorsalgia, unspecified: Secondary | ICD-10-CM

## 2022-01-13 DIAGNOSIS — G8929 Other chronic pain: Secondary | ICD-10-CM | POA: Diagnosis not present

## 2022-01-13 DIAGNOSIS — M5412 Radiculopathy, cervical region: Secondary | ICD-10-CM | POA: Diagnosis not present

## 2022-01-13 MED ORDER — UBRELVY 100 MG PO TABS: 1.0000 | ORAL_TABLET | ORAL | 5 refills | Status: DC | PRN

## 2022-01-13 MED ORDER — METHOCARBAMOL 500 MG PO TABS: 500.0000 mg | ORAL_TABLET | Freq: Three times a day (TID) | ORAL | 5 refills | Status: DC

## 2022-01-13 MED ORDER — NORTRIPTYLINE HCL 25 MG PO CAPS: 25.0000 mg | ORAL_CAPSULE | Freq: Every day | ORAL | 5 refills | Status: DC

## 2022-01-13 NOTE — Patient Instructions (Signed)
Increase nortriptyline to '25mg'$  at bedtime.  We can increase dose in 4 weeks if needed Ubrelvy as needed Methocarbamol for muscle spasms Refer to physical therapy for neck pain Will refer you back to Dr. [facility] Follow up 4-5 months.

## 2022-01-19 ENCOUNTER — Ambulatory Visit (HOSPITAL_BASED_OUTPATIENT_CLINIC_OR_DEPARTMENT_OTHER): Payer: Commercial Managed Care - HMO | Admitting: Physical Therapy

## 2022-01-19 ENCOUNTER — Encounter (HOSPITAL_BASED_OUTPATIENT_CLINIC_OR_DEPARTMENT_OTHER): Payer: Self-pay | Admitting: Physical Therapy

## 2022-01-19 DIAGNOSIS — M5459 Other low back pain: Secondary | ICD-10-CM | POA: Diagnosis not present

## 2022-01-19 DIAGNOSIS — M542 Cervicalgia: Secondary | ICD-10-CM

## 2022-01-19 DIAGNOSIS — M6281 Muscle weakness (generalized): Secondary | ICD-10-CM

## 2022-01-19 NOTE — Therapy (Signed)
[name]  OUTPATIENT PHYSICAL THERAPY THORACOLUMBAR TREATMENT   Patient Name: [name] MRN: [id number] DOB:December 11, 1970, 51 y.o., female Today's Date: 01/19/2022   PT End of Session - 01/19/22 1143     Visit Number 10    Number of Visits 20    Date for PT Re-Evaluation 04/19/22    Authorization Type [employer]    PT Start Time 1113   late   PT Stop Time 1143    PT Time Calculation (min) 30 min    Activity Tolerance Patient tolerated treatment well    Behavior During Therapy [facility] for tasks assessed/performed                    Past Medical History:  Diagnosis Date   Abdominal pain, epigastric 11/20/2019   Asthma    as a child   Cervical dysplasia    Gastroesophageal reflux disease 11/20/2019   Genetic testing 02/01/2018   Genetic testing was performed in 2015. The [employer] O[employer] panel was ordered. In total, 23 genes were analyzed as part of this panel: ATM, BARD1, BRCA1, BRCA2, BRIP1, CDH1, CHEK2, EPCAM, MLH1, MRE11A, MSH2, MSH6, MUTYH, NBN, NF1, PALB2, PMS2, PTEN, RAD50, RAD51C, RAD51D, STK11, and TP53. No pathogenic variants were detected. Genetic testing did detect a Variant of Unknown Significance (VUS) at the t   GERD (gastroesophageal reflux disease)    had during chemo   Headache 06/12/2013   History of chemotherapy    History of kidney stones    History of radiation therapy 11/24/13- 01/08/14   reconstructed right breast/chest wall 4500 cGy 25 sessions, electron beam boost 1440 cGy 8 sessions   Liver masses 12/06/2016   Major depressive disorder, in remission 03/12/2017   Malignant neoplasm of lower-inner quadrant of right breast of female, estrogen receptor positive 2012   Pneumonia    PONV (postoperative nausea and vomiting)    Recurrent cancer of right breast 12/22/2013   Thrombocytopenia 05/22/2013   Past Surgical History:  Procedure Laterality Date   ADENOIDECTOMY     BREAST LUMPECTOMY WITH NEEDLE LOCALIZATION Right 05/05/2013   Procedure: EXCISION RECURRENT  CANCER RIGHT BREAST WITH NEEDLE LOCALOZATION;  Surgeon: Adin Hector, MD;  Location: [place];  Service: General;  Laterality: Right;   BREAST RECONSTRUCTION  06/29/2011   Procedure: BREAST RECONSTRUCTION;  Surgeon: Theodoro Kos, DO;  Location: [place];  Service: Plastics;  Laterality: Bilateral;  Immediate Bilateral Breast Reconstruction with Bilateral Tissue Expanders and placement of  Alloderm    DILATION AND CURETTAGE OF UTERUS     fibroid tumor surgery     lazy eye     corrective surgery   MASTECTOMY W/ SENTINEL NODE BIOPSY  06/29/2011   Procedure: MASTECTOMY WITH SENTINEL LYMPH NODE BIOPSY;  Surgeon: Adin Hector, MD;  Location: [place];  Service: General;  Laterality: Right;  right skin spairing mstectomy and right sentinel lymph node biopsy   PORT-A-CATH REMOVAL  03/12/2012   Procedure: MINOR REMOVAL PORT-A-CATH;  Surgeon: Adin Hector, MD;  Location: [place];  Service: General;  Laterality: N/A;  Upper left   PORTACATH PLACEMENT Left 05/05/2013   Procedure: INSERTION PORT-A-CATH;  Surgeon: Adin Hector, MD;  Location: [place];  Service: General;  Laterality: Left;   RHINOPLASTY     uterine cervical treatments  2002   for precancerous lesions   Patient Active Problem List   Diagnosis Date Noted   Snoring 07/25/2021   Excessive daytime sleepiness 07/25/2021   Routine general medical  examination at a health care facility 07/08/2021   Positive ANA (antinuclear antibody) 03/18/2021   Left leg pain 03/18/2021   Arthralgia 03/11/2021   Back pain 02/26/2021   Neck pain 02/26/2021   Gastroesophageal reflux disease 11/20/2019   Major depressive disorder, in remission 03/12/2017   Liver masses 12/06/2016   Recurrent cancer of right breast 12/22/2013   Headache 06/12/2013   Thrombocytopenia 05/22/2013   Malignant neoplasm of lower-inner quadrant of right breast of female, estrogen receptor positive 2012     REFERRING PROVIDER: Gardenia Phlegm,  NP  REFERRING DIAG:  C50.311,Z17.0 (ICD-10-CM) - Malignant neoplasm of lower-inner quadrant of right breast of female, estrogen receptor positive ([place])  M54.50,G89.29 (ICD-10-CM) - Chronic midline low back pain without sciatica    Rationale for Evaluation and Treatment Rehabilitation  THERAPY DIAG:  Cervicalgia  Other low back pain  Muscle weakness (generalized)  ONSET DATE: chronic  SUBJECTIVE:                                                                                                                                                                                           SUBJECTIVE STATEMENT: Pt states that the back is the same pain before. She has had the same time of back pain that is discouraging. She did not have increase in pain from last session. She recently went to neurologist for migraines. Pt has neck pain and occasional NT into the L UE/hand.   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 Yes: NPRS scale: 3/10; 3/10 Pain location: low back, neck Pain description: sore Aggravating factors: constant pain Relieving factors: excedrin tension   PRECAUTIONS: None  WEIGHT BEARING RESTRICTIONS No  FALLS:  Has patient fallen in last 6 months? No  PLOF: Independent  PATIENT GOALS decrease pain   OBJECTIVE:   DIAGNOSTIC FINDINGS:  Oct 2022 MRI- hemangioma at L4, fatty infiltration to sacrum.   Cervical AROM 10/12  flexion WFL  extension WFL p!   L rotation WFL  R rotation WFL  L flexion WFL  R  flexion WFL   (Blank rows = not tested)  UE MMT: 4/5 throughout; pain into mid thoracic area with resisted flexion and ABD  Light touch sensation throughout C4-C7 WFL   LUMBAR ROM:    WFL   LOWER EXTREMITY ROM:      WFL   LOWER EXTREMITY MMT:     MMT Right 10/12 Left 10/12  Hip flexion  4+/5  4+/5  Hip extension      Hip abduction   4+/5  4+/5  Hip adduction  4+/5  4+/5  Hip internal  rotation    4+/5  4+/5  Hip external rotation   4+/5    4+/5   (Blank rows = not tested)   TODAY'S TREATMENT   10/12  STM C/S paraspinals Manual traction 4 mins; too much TTP for supine joint mobs  10/3  Nu-step L3 5 min Bird dog 2x10 Hip hinge at wall 5lbs 2x10 Jefferson  curl 5lbs 3x6 - Cat Cow  - 2 x daily - 7 x weekly - 1 sets - 10 reps - 2 hold - Child's Pose with Sidebending  - 1 x daily - 7 x weekly - 3 sets - 3 breaths hold - Standing Anti-Rotation Press with Anchored Resistance  - 1 x daily - 3 x weekly - 2 sets - 10 reps - Side Stepping with Resistance at Thighs  - 1 x daily - 3 x weekly - 1 sets - 3 reps - 70f hold   9/27  Nu-step L3 5 min Hip hinge at wall 5lbs 2x10 Rag doll/jefferson curl standing at wall 2x5 Sidestepping band at ankles [id number]; retro step with band at ankle 2x 5lb cable Paloff rotations 10x each way    9/20: Shuttle press upright 3*25 Hip hinge with dowel- added stepping fwd/retro/lateral Hip hinge with row- cues for scap retraction ragdoll  9/13  STM bilat L1-3 lumbar paraspinals STM bilat T9-12 thoracic paraspinals CPA bilat grade III T9-L3  Cat/cow 2x breath cycles 10x  Bird dog 2x10 Super man single UE at a time 10x each side Blue TB hip extension/hip hinge 15x   Trigger Point Dry-Needling  Treatment instructions: Expect mild to moderate muscle soreness. S/S of pneumothorax if dry needled over a lung field, and to seek immediate medical attention should they occur. Patient verbalized understanding of these instructions and education.   Patient Consent Given: Yes Education (verbally)provided: Yes Muscles treated: T8-10 paraspinals/multifidi Electrical stimulation performed: N/A Treatment response/outcome: significant LTR with spasming and grabbing of needle, pistoning performed very minimally due to significant twitch response    PATIENT EDUCATION:  Education details: Anatomy of condition, POC, HEP, exercise  form/rationale Person educated: Patient Education method: Explanation, Demonstration, Tactile cues, Verbal cues, and Handouts Education comprehension: verbalized understanding, returned demonstration, verbal cues required, tactile cues required, and needs further education   HOME EXERCISE PROGRAM: Access Code: [email or web address]: [email or web address]   ASSESSMENT:  CLINICAL IMPRESSION: Pt session limited by arrival time. POC combining LBP and neck pain today. Pt c/c of neck pain appears consistent with mechanical neck pain due to history of DDD, OA, and soft tissue restriction. Pt is hypomobility of C/S as well as thoracic sensitivity. No radicular symptoms noted during testing. Of note, pt's LE and L/S have improved in terms of pain free ROM as well as LE strength. Pt has less frequency of pain but continues to suffer from chronic pain type presentation. Pt advised to consult with mental health specialist upstairs for further medical management of pain presentation as pt appears to have stress/anxiety related to current health condition. Consider higher volume loading at next session to aid in muscle endurance for L/S and continue with pain management for C/S until able to start progressive exercise.    OBJECTIVE IMPAIRMENTS decreased activity tolerance, difficulty walking, increased muscle spasms, postural dysfunction, and pain.   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   REHAB POTENTIAL: Good  CLINICAL DECISION MAKING: Stable/uncomplicated  EVALUATION COMPLEXITY: Low   GOALS:  SHORT TERM GOALS: Target date:12/06/21  Will determine need for heel  lift Baseline: Goal status: achieved   LONG TERM GOALS: Target date: 04/10/2022   Bil hip abd strength to meet age appropriate levels per hand held dynamometry Baseline:  Goal status: Initial  2.  Able to demonstrate proper form in long term core strength Baseline:  Goal status: INITIAL  3.  Pt will be able to tolerate sitting or standing for 30 minute periods without increase in pain Baseline:  Goal status: partially met.  4. Pt will be able to demonstrate/report ability to sit/stand/sleep for extended periods of time without pain in order to demonstrate functional improvement and tolerance to static positioning.  Goal Status: Initial  5. Pt will be able to lift/squat/hold >20 lbs in order to demonstrate functional improvement in lumbopelvic and UE strength for return to ADL/household duties.  Goal Status: initial       PLAN: PT FREQUENCY: 1-2x/week  PT DURATION: 8 weeks  PLANNED INTERVENTIONS: Therapeutic exercises, Therapeutic activity, Neuromuscular re-education, Balance training, Gait training, Patient/Family education, Self Care, Joint mobilization, Stair training, Aquatic Therapy, Spinal mobilization, Cryotherapy, Moist heat, Taping, Manual therapy, and Re-evaluation.  PLAN FOR NEXT SESSION: TPDN PRN, continue with core stability training, hip hinge, increase resistance load;    Daleen Bo PT, DPT 01/19/22 1:23 PM

## 2022-01-25 ENCOUNTER — Ambulatory Visit (HOSPITAL_BASED_OUTPATIENT_CLINIC_OR_DEPARTMENT_OTHER): Payer: Commercial Managed Care - HMO | Admitting: Physical Therapy

## 2022-01-25 ENCOUNTER — Encounter (HOSPITAL_BASED_OUTPATIENT_CLINIC_OR_DEPARTMENT_OTHER): Payer: Self-pay | Admitting: Physical Therapy

## 2022-01-25 DIAGNOSIS — M5459 Other low back pain: Secondary | ICD-10-CM | POA: Diagnosis not present

## 2022-01-25 DIAGNOSIS — M6281 Muscle weakness (generalized): Secondary | ICD-10-CM

## 2022-01-25 DIAGNOSIS — M542 Cervicalgia: Secondary | ICD-10-CM

## 2022-01-25 NOTE — Therapy (Signed)
[name]  OUTPATIENT PHYSICAL THERAPY THORACOLUMBAR TREATMENT   Patient Name: [name] MRN: [id number] DOB:27-Feb-1971, 51 y.o., female Today's Date: 01/25/2022   PT End of Session - 01/25/22 1304     Visit Number 11    Number of Visits 20    Date for PT Re-Evaluation 04/10/22    Authorization Type [employer]    PT Start Time 1300    PT Stop Time 1340    PT Time Calculation (min) 40 min    Activity Tolerance Patient tolerated treatment well    Behavior During Therapy [facility] for tasks assessed/performed                     Past Medical History:  Diagnosis Date   Abdominal pain, epigastric 11/20/2019   Asthma    as a child   Cervical dysplasia    Gastroesophageal reflux disease 11/20/2019   Genetic testing 02/01/2018   Genetic testing was performed in 2015. The [employer] OvaNext panel was ordered. In total, 23 genes were analyzed as part of this panel: ATM, BARD1, BRCA1, BRCA2, BRIP1, CDH1, CHEK2, EPCAM, MLH1, MRE11A, MSH2, MSH6, MUTYH, NBN, NF1, PALB2, PMS2, PTEN, RAD50, RAD51C, RAD51D, STK11, and TP53. No pathogenic variants were detected. Genetic testing did detect a Variant of Unknown Significance (VUS) at the t   GERD (gastroesophageal reflux disease)    had during chemo   Headache 06/12/2013   History of chemotherapy    History of kidney stones    History of radiation therapy 11/24/13- 01/08/14   reconstructed right breast/chest wall 4500 cGy 25 sessions, electron beam boost 1440 cGy 8 sessions   Liver masses 12/06/2016   Major depressive disorder, in remission 03/12/2017   Malignant neoplasm of lower-inner quadrant of right breast of female, estrogen receptor positive 2012   Pneumonia    PONV (postoperative nausea and vomiting)    Recurrent cancer of right breast 12/22/2013   Thrombocytopenia 05/22/2013   Past Surgical History:  Procedure Laterality Date   ADENOIDECTOMY     BREAST LUMPECTOMY WITH NEEDLE LOCALIZATION Right 05/05/2013   Procedure: EXCISION RECURRENT CANCER  RIGHT BREAST WITH NEEDLE LOCALOZATION;  Surgeon: Adin Hector, MD;  Location: [place];  Service: General;  Laterality: Right;   BREAST RECONSTRUCTION  06/29/2011   Procedure: BREAST RECONSTRUCTION;  Surgeon: Theodoro Kos, DO;  Location: [place];  Service: Plastics;  Laterality: Bilateral;  Immediate Bilateral Breast Reconstruction with Bilateral Tissue Expanders and placement of  Alloderm    DILATION AND CURETTAGE OF UTERUS     fibroid tumor surgery     lazy eye     corrective surgery   MASTECTOMY W/ SENTINEL NODE BIOPSY  06/29/2011   Procedure: MASTECTOMY WITH SENTINEL LYMPH NODE BIOPSY;  Surgeon: Adin Hector, MD;  Location: [place];  Service: General;  Laterality: Right;  right skin spairing mstectomy and right sentinel lymph node biopsy   PORT-A-CATH REMOVAL  03/12/2012   Procedure: MINOR REMOVAL PORT-A-CATH;  Surgeon: Adin Hector, MD;  Location: [place];  Service: General;  Laterality: N/A;  Upper left   PORTACATH PLACEMENT Left 05/05/2013   Procedure: INSERTION PORT-A-CATH;  Surgeon: Adin Hector, MD;  Location: [place];  Service: General;  Laterality: Left;   RHINOPLASTY     uterine cervical treatments  2002   for precancerous lesions   Patient Active Problem List   Diagnosis Date Noted   Snoring 07/25/2021   Excessive daytime sleepiness 07/25/2021   Routine general medical examination  at a health care facility 07/08/2021   Positive ANA (antinuclear antibody) 03/18/2021   Left leg pain 03/18/2021   Arthralgia 03/11/2021   Back pain 02/26/2021   Neck pain 02/26/2021   Gastroesophageal reflux disease 11/20/2019   Major depressive disorder, in remission 03/12/2017   Liver masses 12/06/2016   Recurrent cancer of right breast 12/22/2013   Headache 06/12/2013   Thrombocytopenia 05/22/2013   Malignant neoplasm of lower-inner quadrant of right breast of female, estrogen receptor positive 2012     REFERRING PROVIDER: Gardenia Phlegm, NP  REFERRING  DIAG:  C50.311,Z17.0 (ICD-10-CM) - Malignant neoplasm of lower-inner quadrant of right breast of female, estrogen receptor positive ([place])  M54.50,G89.29 (ICD-10-CM) - Chronic midline low back pain without sciatica    Rationale for Evaluation and Treatment Rehabilitation  THERAPY DIAG:  Cervicalgia  Other low back pain  Muscle weakness (generalized)  ONSET DATE: chronic  SUBJECTIVE:                                                                                                                                                                                           SUBJECTIVE STATEMENT: Pt states she has tightness into her neck but no increase in pain today. She states her back felt slightly better with hinging today.   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 Yes: NPRS scale: 3/10; 3/10 Pain location: low back, neck Pain description: sore Aggravating factors: constant pain Relieving factors: excedrin tension   PRECAUTIONS: None  WEIGHT BEARING RESTRICTIONS No  FALLS:  Has patient fallen in last 6 months? No  PLOF: Independent  PATIENT GOALS decrease pain   OBJECTIVE:   DIAGNOSTIC FINDINGS:  Oct 2022 MRI- hemangioma at L4, fatty infiltration to sacrum.   Cervical AROM 10/12  flexion WFL  extension WFL p!   L rotation WFL  R rotation WFL  L flexion WFL  R  flexion WFL   (Blank rows = not tested)  UE MMT: 4/5 throughout; pain into mid thoracic area with resisted flexion and ABD  Light touch sensation throughout C4-C7 WFL   LUMBAR ROM:    WFL   LOWER EXTREMITY ROM:      WFL   LOWER EXTREMITY MMT:     MMT Right 10/12 Left 10/12  Hip flexion  4+/5  4+/5  Hip extension      Hip abduction   4+/5  4+/5  Hip adduction  4+/5  4+/5  Hip internal rotation    4+/5  4+/5  Hip external rotation   4+/5    4+/5   (  Blank rows = not tested)   TODAY'S TREATMENT   10/18  Nustep L4 5 min UE  and LE Shoulder rolls AP and PA 2lbs 2x10 YTB horizontal ABD 2x8 S/L shuttle leg press 25lbs 2x15 Farmer's carry 10lbs each 69f x2 Bird dog 2x10 2s pause Seated cable row 3x8   10/12  STM C/S paraspinals Manual traction 4 mins; too much TTP for supine joint mobs  10/3  Nu-step L3 5 min Bird dog 2x10 Hip hinge at wall 5lbs 2x10 Jefferson  curl 5lbs 3x6 - Cat Cow  - 2 x daily - 7 x weekly - 1 sets - 10 reps - 2 hold - Child's Pose with Sidebending  - 1 x daily - 7 x weekly - 3 sets - 3 breaths hold - Standing Anti-Rotation Press with Anchored Resistance  - 1 x daily - 3 x weekly - 2 sets - 10 reps - Side Stepping with Resistance at Thighs  - 1 x daily - 3 x weekly - 1 sets - 3 reps - 369fhold   9/27  Nu-step L3 5 min Hip hinge at wall 5lbs 2x10 Rag doll/jefferson curl standing at wall 2x5 Sidestepping band at ankles [id number] [id number]; retro step with band at ankle 2x 5lb cable Paloff rotations 10x each way    9/20: Shuttle press upright 3*25 Hip hinge with dowel- added stepping fwd/retro/lateral Hip hinge with row- cues for scap retraction ragdoll  9/13  STM bilat L1-3 lumbar paraspinals STM bilat T9-12 thoracic paraspinals CPA bilat grade III T9-L3  Cat/cow 2x breath cycles 10x  Bird dog 2x10 Super man single UE at a time 10x each side Blue TB hip extension/hip hinge 15x   Trigger Point Dry-Needling  Treatment instructions: Expect mild to moderate muscle soreness. S/S of pneumothorax if dry needled over a lung field, and to seek immediate medical attention should they occur. Patient verbalized understanding of these instructions and education.   Patient Consent Given: Yes Education (verbally)provided: Yes Muscles treated: T8-10 paraspinals/multifidi Electrical stimulation performed: N/A Treatment response/outcome: significant LTR with spasming and grabbing of needle, pistoning performed very minimally due to significant twitch response    PATIENT EDUCATION:   Education details: Anatomy of condition, POC, HEP, exercise form/rationale Person educated: Patient Education method: Explanation, Demonstration, Tactile cues, Verbal cues, and Handouts Education comprehension: verbalized understanding, returned demonstration, verbal cues required, tactile cues required, and needs further education   HOME EXERCISE PROGRAM: Access Code: [email or web address]: [email or web address]   ASSESSMENT:  CLINICAL IMPRESSION: Pt able to progress lumbar exercise as well as postural stability exercise for the upper quarter today without increase in pain. Pt with baseline neck discomfort to start session but no increase with scapular retraction of isometric hold exercise. Pt advised to continue with strengthening for the UE and LE as she has started to see pain reduction at the lumbar spine with strength focus. Pt was able to increase volume of loading today without pain. Plan to continue with strengthening at future sessions. Consider addition of cervical retraction against gravity. Pt would benefit from continued skilled therapy in order to reach goals and maximize functional C/S and lumbar strength and ROM for full return to PLOF.    OBJECTIVE IMPAIRMENTS decreased activity tolerance, difficulty walking, increased muscle spasms, postural dysfunction, and pain.   ACTIVITY LIMITATIONS carrying, lifting, bending, sitting, standing, squatting, sleeping, stairs, and locomotion level  PARTICIPATION LIMITATIONS: meal prep, cleaning, laundry, driving, shopping, and community activity  PERSONAL FACTORS 1-2 comorbidities: benign growth found in lumbar  spine, chronic pain, known LLD  are also affecting patient's functional outcome.   REHAB POTENTIAL: Good  CLINICAL DECISION MAKING: Stable/uncomplicated  EVALUATION COMPLEXITY: Low   GOALS:  SHORT TERM GOALS: Target date:12/06/21  Will determine need for heel lift Baseline: Goal status: achieved   LONG TERM  GOALS: Target date: 04/10/2022   Bil hip abd strength to meet age appropriate levels per hand held dynamometry Baseline:  Goal status: Initial  2.  Able to demonstrate proper form in long term core strength Baseline:  Goal status: INITIAL  3.  Pt will be able to tolerate sitting or standing for 30 minute periods without increase in pain Baseline:  Goal status: partially met.  4. Pt will be able to demonstrate/report ability to sit/stand/sleep for extended periods of time without pain in order to demonstrate functional improvement and tolerance to static positioning.  Goal Status: Initial  5. Pt will be able to lift/squat/hold >20 lbs in order to demonstrate functional improvement in lumbopelvic and UE strength for return to ADL/household duties.  Goal Status: initial       PLAN: PT FREQUENCY: 1-2x/week  PT DURATION: 8 weeks  PLANNED INTERVENTIONS: Therapeutic exercises, Therapeutic activity, Neuromuscular re-education, Balance training, Gait training, Patient/Family education, Self Care, Joint mobilization, Stair training, Aquatic Therapy, Spinal mobilization, Cryotherapy, Moist heat, Taping, Manual therapy, and Re-evaluation.  PLAN FOR NEXT SESSION: TPDN PRN, continue with core stability training, hip hinge, increase resistance load;    Daleen Bo PT, DPT 01/25/22 1:44 PM

## 2022-02-01 ENCOUNTER — Ambulatory Visit (HOSPITAL_BASED_OUTPATIENT_CLINIC_OR_DEPARTMENT_OTHER): Payer: Commercial Managed Care - HMO | Admitting: Physical Therapy

## 2022-02-01 ENCOUNTER — Encounter (HOSPITAL_BASED_OUTPATIENT_CLINIC_OR_DEPARTMENT_OTHER): Payer: Self-pay | Admitting: Physical Therapy

## 2022-02-01 DIAGNOSIS — M5459 Other low back pain: Secondary | ICD-10-CM | POA: Diagnosis not present

## 2022-02-01 DIAGNOSIS — M6281 Muscle weakness (generalized): Secondary | ICD-10-CM

## 2022-02-01 DIAGNOSIS — M542 Cervicalgia: Secondary | ICD-10-CM

## 2022-02-01 NOTE — Therapy (Signed)
[name]  OUTPATIENT PHYSICAL THERAPY THORACOLUMBAR TREATMENT   Patient Name: [name] MRN: [id number] DOB:[birth year], 51 y.o., female Today's Date: 02/01/2022   PT End of Session - 02/01/22 1344     Visit Number 12    Number of Visits 20    Date for PT Re-Evaluation 04/10/22    Authorization Type [employer]    PT Start Time 1301    PT Stop Time 1340    PT Time Calculation (min) 39 min    Activity Tolerance Patient tolerated treatment well    Behavior During Therapy [facility] for tasks assessed/performed                      Past Medical History:  Diagnosis Date   Abdominal pain, epigastric 11/20/2019   Asthma    as a child   Cervical dysplasia    Gastroesophageal reflux disease 11/20/2019   Genetic testing 02/01/2018   Genetic testing was performed in 2015. The [employer] panel was ordered. In total, 23 genes were analyzed as part of this panel: ATM, BARD1, BRCA1, BRCA2, BRIP1, CDH1, CHEK2, EPCAM, MLH1, MRE11A, MSH2, MSH6, MUTYH, NBN, NF1, PALB2, PMS2, PTEN, RAD50, RAD51C, RAD51D, STK11, and TP53. No pathogenic variants were detected. Genetic testing did detect a Variant of Unknown Significance (VUS) at the t   GERD (gastroesophageal reflux disease)    had during chemo   Headache 06/12/2013   History of chemotherapy    History of kidney stones    History of radiation therapy 11/24/13- 01/08/14   reconstructed right breast/chest wall 4500 cGy 25 sessions, electron beam boost 1440 cGy 8 sessions   Liver masses 12/06/2016   Major depressive disorder, in remission 03/12/2017   Malignant neoplasm of lower-inner quadrant of right breast of female, estrogen receptor positive 2012   Pneumonia    PONV (postoperative nausea and vomiting)    Recurrent cancer of right breast 12/22/2013   Thrombocytopenia 05/22/2013   Past Surgical History:  Procedure Laterality Date   ADENOIDECTOMY     BREAST LUMPECTOMY WITH NEEDLE LOCALIZATION Right 05/05/2013   Procedure: EXCISION RECURRENT CANCER  RIGHT BREAST WITH NEEDLE LOCALOZATION;  Surgeon: Adin Hector, MD;  Location: [place];  Service: General;  Laterality: Right;   BREAST RECONSTRUCTION  06/29/2011   Procedure: BREAST RECONSTRUCTION;  Surgeon: Theodoro Kos, DO;  Location: [place];  Service: Plastics;  Laterality: Bilateral;  Immediate Bilateral Breast Reconstruction with Bilateral Tissue Expanders and placement of  Alloderm    DILATION AND CURETTAGE OF UTERUS     fibroid tumor surgery     lazy eye     corrective surgery   MASTECTOMY W/ SENTINEL NODE BIOPSY  06/29/2011   Procedure: MASTECTOMY WITH SENTINEL LYMPH NODE BIOPSY;  Surgeon: Adin Hector, MD;  Location: [place];  Service: General;  Laterality: Right;  right skin spairing mstectomy and right sentinel lymph node biopsy   PORT-A-CATH REMOVAL  03/12/2012   Procedure: MINOR REMOVAL PORT-A-CATH;  Surgeon: Adin Hector, MD;  Location: [place];  Service: General;  Laterality: N/A;  Upper left   PORTACATH PLACEMENT Left 05/05/2013   Procedure: INSERTION PORT-A-CATH;  Surgeon: Adin Hector, MD;  Location: [place];  Service: General;  Laterality: Left;   RHINOPLASTY     uterine cervical treatments  2002   for precancerous lesions   Patient Active Problem List   Diagnosis Date Noted   Snoring 07/25/2021   Excessive daytime sleepiness 07/25/2021   Routine general medical  examination at a health care facility 07/08/2021   Positive ANA (antinuclear antibody) 03/18/2021   Left leg pain 03/18/2021   Arthralgia 03/11/2021   Back pain 02/26/2021   Neck pain 02/26/2021   Gastroesophageal reflux disease 11/20/2019   Major depressive disorder, in remission 03/12/2017   Liver masses 12/06/2016   Recurrent cancer of right breast 12/22/2013   Headache 06/12/2013   Thrombocytopenia 05/22/2013   Malignant neoplasm of lower-inner quadrant of right breast of female, estrogen receptor positive 2012     REFERRING PROVIDER: Gardenia Phlegm, NP  REFERRING  DIAG:  C50.311,Z17.0 (ICD-10-CM) - Malignant neoplasm of lower-inner quadrant of right breast of female, estrogen receptor positive ([place])  M54.50,G89.29 (ICD-10-CM) - Chronic midline low back pain without sciatica    Rationale for Evaluation and Treatment Rehabilitation  THERAPY DIAG:  Cervicalgia  Other low back pain  Muscle weakness (generalized)  ONSET DATE: chronic  SUBJECTIVE:                                                                                                                                                                                           SUBJECTIVE STATEMENT: Pt states she has tightness into her neck but no increase in pain today. She states her back felt slightly better with hinging today.   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 Yes: NPRS scale: 3/10; 3/10 Pain location: low back, neck Pain description: sore Aggravating factors: constant pain Relieving factors: excedrin tension   PRECAUTIONS: None  WEIGHT BEARING RESTRICTIONS No  FALLS:  Has patient fallen in last 6 months? No  PLOF: Independent  PATIENT GOALS decrease pain   OBJECTIVE:   DIAGNOSTIC FINDINGS:  Oct 2022 MRI- hemangioma at L4, fatty infiltration to sacrum.   Cervical AROM 10/12  flexion WFL  extension WFL p!   L rotation WFL  R rotation WFL  L flexion WFL  R  flexion WFL   (Blank rows = not tested)  UE MMT: 4/5 throughout; pain into mid thoracic area with resisted flexion and ABD  Light touch sensation throughout C4-C7 WFL   LUMBAR ROM:    WFL   LOWER EXTREMITY ROM:      WFL   LOWER EXTREMITY MMT:     MMT Right 10/12 Left 10/12  Hip flexion  4+/5  4+/5  Hip extension      Hip abduction   4+/5  4+/5  Hip adduction  4+/5  4+/5  Hip internal rotation    4+/5  4+/5  Hip external rotation   4+/5    4+/5   (  Blank rows = not tested)   TODAY'S TREATMENT   10/25  Nustep L4 6 min UE  and LE  Shoulder rolls AP and PA 2lbs 2x10 RTB horizontal ABD 2x8 Gray shuttle leg press 25lbs 3x8 RDL at wall 10lbs 10x, 2x10 20lbs  Farmer's carry 10lbs each 24f x3 Bosu squat 2x10 Unilateral seated cable row 3x8 20lbs   10/18  Nustep L4 5 min UE and LE Shoulder rolls AP and PA 2lbs 2x10 YTB horizontal ABD 2x8 S/L shuttle leg press 25lbs 2x15 Farmer's carry 10lbs each [id number] Bird dog 2x10 2s pause Seated cable row 3x8   10/12  STM C/S paraspinals Manual traction 4 mins; too much TTP for supine joint mobs  10/3  Nu-step L3 5 min Bird dog 2x10 Hip hinge at wall 5lbs 2x10 Jefferson  curl 5lbs 3x6 - Cat Cow  - 2 x daily - 7 x weekly - 1 sets - 10 reps - 2 hold - Child's Pose with Sidebending  - 1 x daily - 7 x weekly - 3 sets - 3 breaths hold - Standing Anti-Rotation Press with Anchored Resistance  - 1 x daily - 3 x weekly - 2 sets - 10 reps - Side Stepping with Resistance at Thighs  - 1 x daily - 3 x weekly - 1 sets - 3 reps - 359fold   9/27  Nu-step L3 5 min Hip hinge at wall 5lbs 2x10 Rag doll/jefferson curl standing at wall 2x5 Sidestepping band at ankles YTB [id number]; retro step with band at ankle 2x 5lb cable Paloff rotations 10x each way    9/20: Shuttle press upright 3*25 Hip hinge with dowel- added stepping fwd/retro/lateral Hip hinge with row- cues for scap retraction ragdoll  9/13  STM bilat L1-3 lumbar paraspinals STM bilat T9-12 thoracic paraspinals CPA bilat grade III T9-L3  Cat/cow 2x breath cycles 10x  Bird dog 2x10 Super man single UE at a time 10x each side Blue TB hip extension/hip hinge 15x   Trigger Point Dry-Needling  Treatment instructions: Expect mild to moderate muscle soreness. S/S of pneumothorax if dry needled over a lung field, and to seek immediate medical attention should they occur. Patient verbalized understanding of these instructions and education.   Patient Consent Given: Yes Education (verbally)provided:  Yes Muscles treated: T8-10 paraspinals/multifidi Electrical stimulation performed: N/A Treatment response/outcome: significant LTR with spasming and grabbing of needle, pistoning performed very minimally due to significant twitch response    PATIENT EDUCATION:  Education details: Anatomy of condition, POC, HEP, exercise form/rationale Person educated: Patient Education method: Explanation, Demonstration, Tactile cues, Verbal cues, and Handouts Education comprehension: verbalized understanding, returned demonstration, verbal cues required, tactile cues required, and needs further education   HOME EXERCISE PROGRAM: Access Code: [email or web address]: [email or web address]   ASSESSMENT:  CLINICAL IMPRESSION: Pt able to start gym based strengthening today with light loading progression. Pt demos much improved technique and form with loaded hip hinging, squatting, and loaded carries. Pt without pain during session. Pt advised to continue with strengthening as tolerated at home. HEP updated today. Pt seems to have "turned the corner" on strengthening exercise and is starting to have reduction of baseline pain with exercise. Pt advised to still be aware of DOMS but to continue with progressive loading. Plan to continue with more gym based strength as tolerated. Pt would benefit from continued skilled therapy in order to reach goals and maximize functional C/S and lumbar strength and ROM for full return to PLOF.  OBJECTIVE IMPAIRMENTS decreased activity tolerance, difficulty walking, increased muscle spasms, postural dysfunction, and pain.   ACTIVITY LIMITATIONS carrying, lifting, bending, sitting, standing, squatting, sleeping, stairs, and locomotion level  PARTICIPATION LIMITATIONS: meal prep, cleaning, laundry, driving, shopping, and community activity  PERSONAL FACTORS 1-2 comorbidities: benign growth found in lumbar spine, chronic pain, known LLD  are also affecting patient's  functional outcome.   REHAB POTENTIAL: Good  CLINICAL DECISION MAKING: Stable/uncomplicated  EVALUATION COMPLEXITY: Low   GOALS:  SHORT TERM GOALS: Target date:12/06/21  Will determine need for heel lift Baseline: Goal status: achieved   LONG TERM GOALS: Target date: 04/10/2022   Bil hip abd strength to meet age appropriate levels per hand held dynamometry Baseline:  Goal status: Initial  2.  Able to demonstrate proper form in long term core strength Baseline:  Goal status: INITIAL  3.  Pt will be able to tolerate sitting or standing for 30 minute periods without increase in pain Baseline:  Goal status: partially met.  4. Pt will be able to demonstrate/report ability to sit/stand/sleep for extended periods of time without pain in order to demonstrate functional improvement and tolerance to static positioning.  Goal Status: Initial  5. Pt will be able to lift/squat/hold >20 lbs in order to demonstrate functional improvement in lumbopelvic and UE strength for return to ADL/household duties.  Goal Status: initial       PLAN: PT FREQUENCY: 1-2x/week  PT DURATION: 8 weeks  PLANNED INTERVENTIONS: Therapeutic exercises, Therapeutic activity, Neuromuscular re-education, Balance training, Gait training, Patient/Family education, Self Care, Joint mobilization, Stair training, Aquatic Therapy, Spinal mobilization, Cryotherapy, Moist heat, Taping, Manual therapy, and Re-evaluation.  PLAN FOR NEXT SESSION: TPDN PRN, continue with core stability training, hip hinge, increase resistance load;    Daleen Bo PT, DPT 02/01/22 1:45 PM

## 2022-02-08 ENCOUNTER — Ambulatory Visit (HOSPITAL_BASED_OUTPATIENT_CLINIC_OR_DEPARTMENT_OTHER): Payer: Commercial Managed Care - HMO | Attending: Orthopaedic Surgery | Admitting: Physical Therapy

## 2022-02-08 ENCOUNTER — Encounter (HOSPITAL_BASED_OUTPATIENT_CLINIC_OR_DEPARTMENT_OTHER): Payer: Self-pay | Admitting: Physical Therapy

## 2022-02-08 DIAGNOSIS — M6281 Muscle weakness (generalized): Secondary | ICD-10-CM | POA: Insufficient documentation

## 2022-02-08 DIAGNOSIS — M542 Cervicalgia: Secondary | ICD-10-CM | POA: Diagnosis not present

## 2022-02-08 DIAGNOSIS — R293 Abnormal posture: Secondary | ICD-10-CM | POA: Diagnosis present

## 2022-02-08 DIAGNOSIS — M5459 Other low back pain: Secondary | ICD-10-CM | POA: Diagnosis present

## 2022-02-08 NOTE — Therapy (Signed)
[name]  OUTPATIENT PHYSICAL THERAPY THORACOLUMBAR TREATMENT   Patient Name: [name] MRN: [id number] DOB:April 19, 1970, 51 y.o., female Today's Date: 02/08/2022   PT End of Session - 02/08/22 1228     Visit Number 13    Number of Visits 20    Date for PT Re-Evaluation 04/10/22    Authorization Type [employer]    PT Start Time 1201    PT Stop Time 1233    PT Time Calculation (min) 32 min    Activity Tolerance Patient tolerated treatment well    Behavior During Therapy [facility] for tasks assessed/performed                       Past Medical History:  Diagnosis Date   Abdominal pain, epigastric 11/20/2019   Asthma    as a child   Cervical dysplasia    Gastroesophageal reflux disease 11/20/2019   Genetic testing 02/01/2018   Genetic testing was performed in 2015. The [employer] O[employer] panel was ordered. In total, 23 genes were analyzed as part of this panel: ATM, BARD1, BRCA1, BRCA2, BRIP1, CDH1, CHEK2, EPCAM, MLH1, MRE11A, MSH2, MSH6, MUTYH, NBN, NF1, PALB2, PMS2, PTEN, RAD50, RAD51C, RAD51D, STK11, and TP53. No pathogenic variants were detected. Genetic testing did detect a Variant of Unknown Significance (VUS) at the t   GERD (gastroesophageal reflux disease)    had during chemo   Headache 06/12/2013   History of chemotherapy    History of kidney stones    History of radiation therapy 11/24/13- 01/08/14   reconstructed right breast/chest wall 4500 cGy 25 sessions, electron beam boost 1440 cGy 8 sessions   Liver masses 12/06/2016   Major depressive disorder, in remission 03/12/2017   Malignant neoplasm of lower-inner quadrant of right breast of female, estrogen receptor positive 2012   Pneumonia    PONV (postoperative nausea and vomiting)    Recurrent cancer of right breast 12/22/2013   Thrombocytopenia 05/22/2013   Past Surgical History:  Procedure Laterality Date   ADENOIDECTOMY     BREAST LUMPECTOMY WITH NEEDLE LOCALIZATION Right 05/05/2013   Procedure: EXCISION RECURRENT  CANCER RIGHT BREAST WITH NEEDLE LOCALOZATION;  Surgeon: Adin Hector, MD;  Location: [place];  Service: General;  Laterality: Right;   BREAST RECONSTRUCTION  06/29/2011   Procedure: BREAST RECONSTRUCTION;  Surgeon: Theodoro Kos, DO;  Location: [place];  Service: Plastics;  Laterality: Bilateral;  Immediate Bilateral Breast Reconstruction with Bilateral Tissue Expanders and placement of  Alloderm    DILATION AND CURETTAGE OF UTERUS     fibroid tumor surgery     lazy eye     corrective surgery   MASTECTOMY W/ SENTINEL NODE BIOPSY  06/29/2011   Procedure: MASTECTOMY WITH SENTINEL LYMPH NODE BIOPSY;  Surgeon: Adin Hector, MD;  Location: [place];  Service: General;  Laterality: Right;  right skin spairing mstectomy and right sentinel lymph node biopsy   PORT-A-CATH REMOVAL  03/12/2012   Procedure: MINOR REMOVAL PORT-A-CATH;  Surgeon: Adin Hector, MD;  Location: [place];  Service: General;  Laterality: N/A;  Upper left   PORTACATH PLACEMENT Left 05/05/2013   Procedure: INSERTION PORT-A-CATH;  Surgeon: Adin Hector, MD;  Location: [place];  Service: General;  Laterality: Left;   RHINOPLASTY     uterine cervical treatments  2002   for precancerous lesions   Patient Active Problem List   Diagnosis Date Noted   Snoring 07/25/2021   Excessive daytime sleepiness 07/25/2021   Routine general  medical examination at a health care facility 07/08/2021   Positive ANA (antinuclear antibody) 03/18/2021   Left leg pain 03/18/2021   Arthralgia 03/11/2021   Back pain 02/26/2021   Neck pain 02/26/2021   Gastroesophageal reflux disease 11/20/2019   Major depressive disorder, in remission 03/12/2017   Liver masses 12/06/2016   Recurrent cancer of right breast 12/22/2013   Headache 06/12/2013   Thrombocytopenia 05/22/2013   Malignant neoplasm of lower-inner quadrant of right breast of female, estrogen receptor positive 2012     REFERRING PROVIDER: Gardenia Phlegm,  NP  REFERRING DIAG:  C50.311,Z17.0 (ICD-10-CM) - Malignant neoplasm of lower-inner quadrant of right breast of female, estrogen receptor positive ([place])  M54.50,G89.29 (ICD-10-CM) - Chronic midline low back pain without sciatica    Rationale for Evaluation and Treatment Rehabilitation  THERAPY DIAG:  Cervicalgia  Other low back pain  Muscle weakness (generalized)  ONSET DATE: chronic  SUBJECTIVE:                                                                                                                                                                                           SUBJECTIVE STATEMENT: Wore wedges to a gender reveal on Saturday, on Sunday I picked up my Rt foot to wash and felt a horrible pull across my lower back and have been in pain in whole body since then.    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 Yes: NPRS scale: 3/10; 3/10 Pain location: low back, neck Pain description: sore Aggravating factors: constant pain Relieving factors: excedrin tension   PRECAUTIONS: None  WEIGHT BEARING RESTRICTIONS No  FALLS:  Has patient fallen in last 6 months? No  PLOF: Independent  PATIENT GOALS decrease pain   OBJECTIVE:   DIAGNOSTIC FINDINGS:  Oct 2022 MRI- hemangioma at L4, fatty infiltration to sacrum.   Cervical AROM 10/12  flexion WFL  extension WFL p!   L rotation WFL  R rotation WFL  L flexion WFL  R  flexion WFL   (Blank rows = not tested)  UE MMT: 4/5 throughout; pain into mid thoracic area with resisted flexion and ABD  Light touch sensation throughout C4-C7 WFL   LUMBAR ROM:    WFL   LOWER EXTREMITY ROM:      WFL   LOWER EXTREMITY MMT:     MMT Right 10/12 Left 10/12  Hip flexion  4+/5  4+/5  Hip extension      Hip abduction   4+/5  4+/5  Hip adduction  4+/5  4+/5  Hip internal rotation  4+/5  4+/5  Hip external rotation   4+/5    4+/5   (Blank rows = not  tested)   TODAY'S TREATMENT   Treatment                            02/08/22:  MANUAL: STM Lt QL, Lt piriformis, manual traction for QL stretch on Lt side; passive LE stretching- HS & piriformis Hooklying ball squeeze with ab set Hooklying ab set with LE ext Ab set with step up to TT Bird dog progression- started with single extremity for core contraction- full bird dog- bird dog with tap/lift Primal push up   10/25  Nustep L4 6 min UE and LE  Shoulder rolls AP and PA 2lbs 2x10 RTB horizontal ABD 2x8 Gray shuttle leg press 25lbs 3x8 RDL at wall 10lbs 10x, 2x10 20lbs  Farmer's carry 10lbs each 83f x3 Bosu squat 2x10 Unilateral seated cable row 3x8 20lbs   10/18  Nustep L4 5 min UE and LE Shoulder rolls AP and PA 2lbs 2x10 YTB horizontal ABD 2x8 S/L shuttle leg press 25lbs 2x15 Farmer's carry 10lbs each [id number] Bird dog 2x10 2s pause Seated cable row 3x8    PATIENT EDUCATION:  Education details: Anatomy of condition, POC, HEP, exercise form/rationale Person educated: Patient Education method: Explanation, Demonstration, Tactile cues, Verbal cues, and Handouts Education comprehension: verbalized understanding, returned demonstration, verbal cues required, tactile cues required, and needs further education   HOME EXERCISE PROGRAM: Access Code: [email or web address]: [email or web address]   ASSESSMENT:  CLINICAL IMPRESSION: Spasm in Lt QL reduced with STM to distal trigger point. Discussed importance of strethcing after wearing heels as it tightens post chain. Returned to core activation activities and practiced with current qped exercises.    OBJECTIVE IMPAIRMENTS decreased activity tolerance, difficulty walking, increased muscle spasms, postural dysfunction, and pain.   ACTIVITY LIMITATIONS carrying, lifting, bending, sitting, standing, squatting, sleeping, stairs, and locomotion level  PARTICIPATION LIMITATIONS: meal prep, cleaning, laundry, driving,  shopping, and community activity  PERSONAL FACTORS 1-2 comorbidities: benign growth found in lumbar spine, chronic pain, known LLD  are also affecting patient's functional outcome.   REHAB POTENTIAL: Good  CLINICAL DECISION MAKING: Stable/uncomplicated  EVALUATION COMPLEXITY: Low   GOALS:  SHORT TERM GOALS: Target date:12/06/21  Will determine need for heel lift Baseline: Goal status: achieved   LONG TERM GOALS: Target date: 04/10/2022   Bil hip abd strength to meet age appropriate levels per hand held dynamometry Baseline:  Goal status: Initial  2.  Able to demonstrate proper form in long term core strength Baseline:  Goal status: INITIAL  3.  Pt will be able to tolerate sitting or standing for 30 minute periods without increase in pain Baseline:  Goal status: partially met.  4. Pt will be able to demonstrate/report ability to sit/stand/sleep for extended periods of time without pain in order to demonstrate functional improvement and tolerance to static positioning.  Goal Status: Initial  5. Pt will be able to lift/squat/hold >20 lbs in order to demonstrate functional improvement in lumbopelvic and UE strength for return to ADL/household duties.  Goal Status: initial       PLAN: PT FREQUENCY: 1-2x/week  PT DURATION: 8 weeks  PLANNED INTERVENTIONS: Therapeutic exercises, Therapeutic activity, Neuromuscular re-education, Balance training, Gait training, Patient/Family education, Self Care, Joint mobilization, Stair training, Aquatic Therapy, Spinal mobilization, Cryotherapy, Moist heat, Taping, Manual therapy, and Re-evaluation.  PLAN FOR NEXT SESSION: TPDN PRN, continue with core  stability training, hip hinge, increase resistance load;   Enrigue Hashimi C. Stefania Goulart PT, DPT 02/08/22 12:38 PM

## 2022-02-15 ENCOUNTER — Encounter (HOSPITAL_BASED_OUTPATIENT_CLINIC_OR_DEPARTMENT_OTHER): Payer: Self-pay | Admitting: Physical Therapy

## 2022-02-15 ENCOUNTER — Ambulatory Visit (HOSPITAL_BASED_OUTPATIENT_CLINIC_OR_DEPARTMENT_OTHER): Payer: Commercial Managed Care - HMO | Admitting: Physical Therapy

## 2022-02-15 DIAGNOSIS — M5459 Other low back pain: Secondary | ICD-10-CM

## 2022-02-15 DIAGNOSIS — M6281 Muscle weakness (generalized): Secondary | ICD-10-CM

## 2022-02-15 DIAGNOSIS — M542 Cervicalgia: Secondary | ICD-10-CM

## 2022-02-15 DIAGNOSIS — R293 Abnormal posture: Secondary | ICD-10-CM

## 2022-02-15 NOTE — Therapy (Signed)
[name]  OUTPATIENT PHYSICAL THERAPY THORACOLUMBAR TREATMENT   Patient Name: [name] MRN: [id number] DOB:05-Nov-1970, 51 y.o., female Today's Date: 02/15/2022   PT End of Session - 02/15/22 1239     Visit Number 14    Number of Visits 20    Date for PT Re-Evaluation 04/10/22    Authorization Type [employer]    PT Start Time 1238    PT Stop Time 1312    PT Time Calculation (min) 34 min    Activity Tolerance Patient tolerated treatment well    Behavior During Therapy [facility] for tasks assessed/performed                       Past Medical History:  Diagnosis Date   Abdominal pain, epigastric 11/20/2019   Asthma    as a child   Cervical dysplasia    Gastroesophageal reflux disease 11/20/2019   Genetic testing 02/01/2018   Genetic testing was performed in 2015. The [employer] panel was ordered. In total, 23 genes were analyzed as part of this panel: ATM, BARD1, BRCA1, BRCA2, BRIP1, CDH1, CHEK2, EPCAM, MLH1, MRE11A, MSH2, MSH6, MUTYH, NBN, NF1, PALB2, PMS2, PTEN, RAD50, RAD51C, RAD51D, STK11, and TP53. No pathogenic variants were detected. Genetic testing did detect a Variant of Unknown Significance (VUS) at the t   GERD (gastroesophageal reflux disease)    had during chemo   Headache 06/12/2013   History of chemotherapy    History of kidney stones    History of radiation therapy 11/24/13- 01/08/14   reconstructed right breast/chest wall 4500 cGy 25 sessions, electron beam boost 1440 cGy 8 sessions   Liver masses 12/06/2016   Major depressive disorder, in remission 03/12/2017   Malignant neoplasm of lower-inner quadrant of right breast of female, estrogen receptor positive 2012   Pneumonia    PONV (postoperative nausea and vomiting)    Recurrent cancer of right breast 12/22/2013   Thrombocytopenia 05/22/2013   Past Surgical History:  Procedure Laterality Date   ADENOIDECTOMY     BREAST LUMPECTOMY WITH NEEDLE LOCALIZATION Right 05/05/2013   Procedure: EXCISION RECURRENT  CANCER RIGHT BREAST WITH NEEDLE LOCALOZATION;  Surgeon: Adin Hector, MD;  Location: [place];  Service: General;  Laterality: Right;   BREAST RECONSTRUCTION  06/29/2011   Procedure: BREAST RECONSTRUCTION;  Surgeon: Theodoro Kos, DO;  Location: [place];  Service: Plastics;  Laterality: Bilateral;  Immediate Bilateral Breast Reconstruction with Bilateral Tissue Expanders and placement of  Alloderm    DILATION AND CURETTAGE OF UTERUS     fibroid tumor surgery     lazy eye     corrective surgery   MASTECTOMY W/ SENTINEL NODE BIOPSY  06/29/2011   Procedure: MASTECTOMY WITH SENTINEL LYMPH NODE BIOPSY;  Surgeon: Adin Hector, MD;  Location: [place];  Service: General;  Laterality: Right;  right skin spairing mstectomy and right sentinel lymph node biopsy   PORT-A-CATH REMOVAL  03/12/2012   Procedure: MINOR REMOVAL PORT-A-CATH;  Surgeon: Adin Hector, MD;  Location: [place];  Service: General;  Laterality: N/A;  Upper left   PORTACATH PLACEMENT Left 05/05/2013   Procedure: INSERTION PORT-A-CATH;  Surgeon: Adin Hector, MD;  Location: [place];  Service: General;  Laterality: Left;   RHINOPLASTY     uterine cervical treatments  2002   for precancerous lesions   Patient Active Problem List   Diagnosis Date Noted   Snoring 07/25/2021   Excessive daytime sleepiness 07/25/2021   Routine general  medical examination at a health care facility 07/08/2021   Positive ANA (antinuclear antibody) 03/18/2021   Left leg pain 03/18/2021   Arthralgia 03/11/2021   Back pain 02/26/2021   Neck pain 02/26/2021   Gastroesophageal reflux disease 11/20/2019   Major depressive disorder, in remission 03/12/2017   Liver masses 12/06/2016   Recurrent cancer of right breast 12/22/2013   Headache 06/12/2013   Thrombocytopenia 05/22/2013   Malignant neoplasm of lower-inner quadrant of right breast of female, estrogen receptor positive 2012     REFERRING PROVIDER: Gardenia Phlegm,  NP  REFERRING DIAG:  C50.311,Z17.0 (ICD-10-CM) - Malignant neoplasm of lower-inner quadrant of right breast of female, estrogen receptor positive ([place])  M54.50,G89.29 (ICD-10-CM) - Chronic midline low back pain without sciatica    Rationale for Evaluation and Treatment Rehabilitation  THERAPY DIAG:  Cervicalgia  Other low back pain  Muscle weakness (generalized)  Abnormal posture  ONSET DATE: chronic  SUBJECTIVE:                                                                                                                                                                                           SUBJECTIVE STATEMENT: Some spasming in lower back- rubs bil QL.    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 Yes: NPRS scale: 3/10; 3/10 Pain location: low back, neck Pain description: sore Aggravating factors: constant pain Relieving factors: excedrin tension   PRECAUTIONS: None  WEIGHT BEARING RESTRICTIONS No  FALLS:  Has patient fallen in last 6 months? No  PLOF: Independent  PATIENT GOALS decrease pain   OBJECTIVE:   DIAGNOSTIC FINDINGS:  Oct 2022 MRI- hemangioma at L4, fatty infiltration to sacrum.   Cervical AROM 10/12  flexion WFL  extension WFL p!   L rotation WFL  R rotation WFL  L flexion WFL  R  flexion WFL   (Blank rows = not tested)  UE MMT: 4/5 throughout; pain into mid thoracic area with resisted flexion and ABD  Light touch sensation throughout C4-C7 WFL   LUMBAR ROM:    WFL   LOWER EXTREMITY ROM:      WFL   LOWER EXTREMITY MMT:     MMT Right 10/12 Left 10/12  Hip flexion  4+/5  4+/5  Hip extension      Hip abduction   4+/5  4+/5  Hip adduction  4+/5  4+/5  Hip internal rotation    4+/5  4+/5  Hip external rotation   4+/5    4+/5   (Blank rows = not tested)   TODAY'S TREATMENT  Treatment                            02/15/22:  Core contraction Wall bridge + UE  flexion/ext Legs extended heels on wall- upper crucnh reach to ceiling, leg lift to 90 hip flexion, +pelvic lift Side plank knee/elbow with UE horiz abd/add Bird dog knee to elbow Primal push up   Treatment                            02/08/22:  MANUAL: STM Lt QL, Lt piriformis, manual traction for QL stretch on Lt side; passive LE stretching- HS & piriformis Hooklying ball squeeze with ab set Hooklying ab set with LE ext Ab set with step up to TT Bird dog progression- started with single extremity for core contraction- full bird dog- bird dog with tap/lift Primal push up   10/25  Nustep L4 6 min UE and LE  Shoulder rolls AP and PA 2lbs 2x10 RTB horizontal ABD 2x8 Gray shuttle leg press 25lbs 3x8 RDL at wall 10lbs 10x, 2x10 20lbs  Farmer's carry 10lbs each 40f x3 Bosu squat 2x10 Unilateral seated cable row 3x8 20lbs     PATIENT EDUCATION:  Education details: AGeophysicist/field seismologistof condition, POC, HEP, exercise form/rationale Person educated: Patient Education method: Explanation, Demonstration, Tactile cues, Verbal cues, and Handouts Education comprehension: verbalized understanding, returned demonstration, verbal cues required, tactile cues required, and needs further education   HOME EXERCISE PROGRAM: Access Code: [email or web address]: [email or web address]   ASSESSMENT:  CLINICAL IMPRESSION: Focus on general core strength today and updated HEP- encouraged her to be aware of use of back musculature.    OBJECTIVE IMPAIRMENTS decreased activity tolerance, difficulty walking, increased muscle spasms, postural dysfunction, and pain.   ACTIVITY LIMITATIONS carrying, lifting, bending, sitting, standing, squatting, sleeping, stairs, and locomotion level  PARTICIPATION LIMITATIONS: meal prep, cleaning, laundry, driving, shopping, and community activity  PERSONAL FACTORS 1-2 comorbidities: benign growth found in lumbar spine, chronic pain, known LLD  are also affecting  patient's functional outcome.   REHAB POTENTIAL: Good  CLINICAL DECISION MAKING: Stable/uncomplicated  EVALUATION COMPLEXITY: Low   GOALS:  SHORT TERM GOALS: Target date:12/06/21  Will determine need for heel lift Baseline: Goal status: achieved   LONG TERM GOALS: Target date: 04/10/2022   Bil hip abd strength to meet age appropriate levels per hand held dynamometry Baseline:  Goal status: Initial  2.  Able to demonstrate proper form in long term core strength Baseline:  Goal status: INITIAL  3.  Pt will be able to tolerate sitting or standing for 30 minute periods without increase in pain Baseline:  Goal status: partially met.  4. Pt will be able to demonstrate/report ability to sit/stand/sleep for extended periods of time without pain in order to demonstrate functional improvement and tolerance to static positioning.  Goal Status: Initial  5. Pt will be able to lift/squat/hold >20 lbs in order to demonstrate functional improvement in lumbopelvic and UE strength for return to ADL/household duties.  Goal Status: initial       PLAN: PT FREQUENCY: 1-2x/week  PT DURATION: 8 weeks  PLANNED INTERVENTIONS: Therapeutic exercises, Therapeutic activity, Neuromuscular re-education, Balance training, Gait training, Patient/Family education, Self Care, Joint mobilization, Stair training, Aquatic Therapy, Spinal mobilization, Cryotherapy, Moist heat, Taping, Manual therapy, and Re-evaluation.  PLAN FOR NEXT SESSION: TPDN PRN, continue with core stability training, hip hinge, increase resistance load;   Habib Kise C. Lovis More  PT, DPT 02/15/22 1:12 PM

## 2022-02-16 ENCOUNTER — Ambulatory Visit (INDEPENDENT_AMBULATORY_CARE_PROVIDER_SITE_OTHER): Payer: Commercial Managed Care - HMO | Admitting: Physician Assistant

## 2022-02-16 ENCOUNTER — Encounter: Payer: Self-pay | Admitting: Physician Assistant

## 2022-02-16 VITALS — HR 100 | Ht 63.0 in | Wt 125.6 lb

## 2022-02-16 DIAGNOSIS — R1013 Epigastric pain: Secondary | ICD-10-CM | POA: Diagnosis not present

## 2022-02-16 DIAGNOSIS — R6881 Early satiety: Secondary | ICD-10-CM

## 2022-02-16 DIAGNOSIS — R09A2 Foreign body sensation, throat: Secondary | ICD-10-CM

## 2022-02-16 DIAGNOSIS — R194 Change in bowel habit: Secondary | ICD-10-CM | POA: Diagnosis not present

## 2022-02-16 DIAGNOSIS — K920 Hematemesis: Secondary | ICD-10-CM | POA: Diagnosis not present

## 2022-02-16 DIAGNOSIS — K921 Melena: Secondary | ICD-10-CM

## 2022-02-16 DIAGNOSIS — G8929 Other chronic pain: Secondary | ICD-10-CM

## 2022-02-16 DIAGNOSIS — K219 Gastro-esophageal reflux disease without esophagitis: Secondary | ICD-10-CM

## 2022-02-16 MED ORDER — NA SULFATE-K SULFATE-MG SULF 17.5-3.13-1.6 GM/177ML PO SOLN: 1.0000 | Freq: Once | ORAL | 0 refills | Status: AC

## 2022-02-16 MED ORDER — FAMOTIDINE 20 MG PO TABS: 20.0000 mg | ORAL_TABLET | Freq: Two times a day (BID) | ORAL | 3 refills | Status: DC

## 2022-02-16 NOTE — Progress Notes (Signed)
Chief Complaint: Constipation, Change in bowel habits, Abdominal discomfort, Epigastric pain, eructations, Hematemesis  HPI:    [name] is a 51 year old female with a past medical history as listed below including reflux, known to Dr. Silverio Decamp, who was referred to me by Hoyt Koch, * for a myriad of GI complaints including constipation, change in bowel habits, abdominal discomfort, epigastric pain, eructations and hematemesis.    12/2017 EGD with gastritis and multiple nonbleeding duodenal ulcers.  Pathology showed reactive gastropathy.  Placed on Her tonics 40 mg daily and Carafate.    12/12/2017 colonoscopy with nonbleeding internal hemorrhoids and otherwise normal.  Repeat recommended in 5 years given family history of colon cancer in her mother.    11/20/2019 patient seen in clinic and at that time was following up for her GERD.  At that time had discontinued her meds and was doing well.  But was having some epigastric discomfort.  At that time recommended she restart Pantoprazole 40 daily and Carafate 4 times daily.    Today, the patient begins by telling me that she has a strong history of colon and stomach cancer in her family including her mom and she is a 2 time breast cancer survivor.  Tells me she feels like her gut is just not right.  She has started having more trouble with constipation telling me that sometimes she only has 1 bowel movement a week and sometimes when she is going to have a bowel movement she vomits.  Apparently this has been going on for a while and nothing is quite normal.  Also describes sometimes seeing some mucus and blood in her stools as well as a white appearance to some of them.    Also discussed today that she continues with epigastric pain and a lot of belching sometimes the belching is so "harsh", that she has a sore throat and she has even belched up a couple of "blood clots" which she spit into the sink just a couple of weeks ago.  She constantly  feels full and bloated and like there is a "knot in her throat".  Previously was on a lot of Excedrin Migraine but switched to Excedrin tension which apparently is just acetaminophen and caffeine over the past couple of months.  Has been off and on of Pantoprazole in the past but tells me when she takes it for only a couple of days it makes her bones hurt.  Apparently has the same side effect for her husband.  She has taken some of his Pepcid once a day but does not feel like it really helps.  Currently not on anything for acid suppression.  Tells me she looked up side effects and saw cancer listed which made her scared.    Denies fever, chills or weight loss.  Past Medical History:  Diagnosis Date   Abdominal pain, epigastric 11/20/2019   Asthma    as a child   Cervical dysplasia    Gastroesophageal reflux disease 11/20/2019   Genetic testing 02/01/2018   Genetic testing was performed in 2015. The [employer] panel was ordered. In total, 23 genes were analyzed as part of this panel: ATM, BARD1, BRCA1, BRCA2, BRIP1, CDH1, CHEK2, EPCAM, MLH1, MRE11A, MSH2, MSH6, MUTYH, NBN, NF1, PALB2, PMS2, PTEN, RAD50, RAD51C, RAD51D, STK11, and TP53. No pathogenic variants were detected. Genetic testing did detect a Variant of Unknown Significance (VUS) at the t   GERD (gastroesophageal reflux disease)    had during chemo   Headache  06/12/2013   History of chemotherapy    History of kidney stones    History of radiation therapy 11/24/13- 01/08/14   reconstructed right breast/chest wall 4500 cGy 25 sessions, electron beam boost 1440 cGy 8 sessions   Liver masses 12/06/2016   Major depressive disorder, in remission 03/12/2017   Malignant neoplasm of lower-inner quadrant of right breast of female, estrogen receptor positive 2012   Pneumonia    PONV (postoperative nausea and vomiting)    Recurrent cancer of right breast 12/22/2013   Thrombocytopenia 05/22/2013    Past Surgical History:  Procedure Laterality Date    ADENOIDECTOMY     BREAST LUMPECTOMY WITH NEEDLE LOCALIZATION Right 05/05/2013   Procedure: EXCISION RECURRENT CANCER RIGHT BREAST WITH NEEDLE LOCALOZATION;  Surgeon: Adin Hector, MD;  Location: [place];  Service: General;  Laterality: Right;   BREAST RECONSTRUCTION  06/29/2011   Procedure: BREAST RECONSTRUCTION;  Surgeon: Theodoro Kos, DO;  Location: [place];  Service: Plastics;  Laterality: Bilateral;  Immediate Bilateral Breast Reconstruction with Bilateral Tissue Expanders and placement of  Alloderm    DILATION AND CURETTAGE OF UTERUS     fibroid tumor surgery     lazy eye     corrective surgery   MASTECTOMY W/ SENTINEL NODE BIOPSY  06/29/2011   Procedure: MASTECTOMY WITH SENTINEL LYMPH NODE BIOPSY;  Surgeon: Adin Hector, MD;  Location: [place];  Service: General;  Laterality: Right;  right skin spairing mstectomy and right sentinel lymph node biopsy   PORT-A-CATH REMOVAL  03/12/2012   Procedure: MINOR REMOVAL PORT-A-CATH;  Surgeon: Adin Hector, MD;  Location: [place];  Service: General;  Laterality: N/A;  Upper left   PORTACATH PLACEMENT Left 05/05/2013   Procedure: INSERTION PORT-A-CATH;  Surgeon: Adin Hector, MD;  Location: [place];  Service: General;  Laterality: Left;   RHINOPLASTY     uterine cervical treatments  2002   for precancerous lesions    Current Outpatient Medications  Medication Sig Dispense Refill   aspirin-acetaminophen-caffeine (EXCEDRIN MIGRAINE) 250-250-65 MG tablet Take by mouth every 6 (six) hours as needed for headache.     Biotin 5000 MCG CAPS Take 5,000 mcg by mouth daily.     Black Pepper-Turmeric (TURMERIC CURCUMIN) 08-998 MG CAPS Take by mouth 2 (two) times daily.     Cholecalciferol (VITAMIN D3) 125 MCG (5000 UT) TBDP Take 1 tablet by mouth daily.     Coenzyme Q10 (COQ10) 150 MG CAPS Take 1 capsule by mouth once.     finasteride (PROSCAR) 5 MG tablet Take 5 mg by mouth daily. 1/2 tablet daily     lidocaine-prilocaine (EMLA)  cream Apply 1 application topically as needed. 100 g 3   methocarbamol (ROBAXIN) 500 MG tablet Take 1 tablet (500 mg total) by mouth 3 (three) times daily. 90 tablet 5   nortriptyline (PAMELOR) 25 MG capsule Take 1 capsule (25 mg total) by mouth at bedtime. 30 capsule 5   Prenat-Fe Poly-Methfol-FA-DHA (VITAFOL FE+) 90-0.6-0.4-200 MG CAPS Take 1 tablet by mouth daily. 30 capsule 11   Ubrogepant (UBRELVY) 100 MG TABS Take 1 tablet by mouth as needed (May repeat in 2 hours.  maximum 2 tablets in 24 hours.). 16 tablet 5   No current facility-administered medications for this visit.    Allergies as of 02/16/2022 - Review Complete 02/15/2022  Allergen Reaction Noted   Tussionex pennkinetic er [hydrocod poli-chlorphe poli er] Other (See Comments) 01/12/2011   Morphine Rash 05/22/2013   Morphine and related  Anxiety 01/12/2011   Penicillin g Rash 05/22/2013   Penicillins Rash 01/12/2011    Family History  Adopted: Yes  Problem Relation Age of Onset   Heart attack Father    Cancer Cousin 23       melanoma; mat first cousin, located on neck   Colon cancer Mother 13   Rectal cancer Mother    Cancer Maternal Aunt 60       stomach and ovarian as separate primaries   Cancer Maternal Uncle 36       lung; smoker   Cancer Maternal Uncle 63       prostate   Cancer Maternal Grandmother 75       enodometrial   Cancer Maternal Aunt 55       breast   Stomach cancer Maternal Aunt     Social History   Socioeconomic History   Marital status: Married    Spouse name: Not on file   Number of children: Not on file   Years of education: 10   Highest education level: GED or equivalent  Occupational History   Not on file  Tobacco Use   Smoking status: Former    Types: Cigarettes    Quit date: 07/20/1996    Years since quitting: 25.5   Smokeless tobacco: Never  Vaping Use   Vaping Use: Never used  Substance and Sexual Activity   Alcohol use: Not Currently    Alcohol/week: 0.0 standard drinks  of alcohol   Drug use: No   Sexual activity: Yes    Partners: Male  Other Topics Concern   Not on file  Social History Narrative   She was adopted by her grandparents as a child.  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  She works in a Engineer, agricultural.right handed   Two story home    Drinks caffeine occasional   Right handed   Social Determinants of Health   Financial Resource Strain: Not on file  Food Insecurity: Not on file  Transportation Needs: Not on file  Physical Activity: Not on file  Stress: Not on file  Social Connections: Not on file  Intimate Partner Violence: Not on file    Review of Systems:    Constitutional: No weight loss, fever or chills Skin: No rash Cardiovascular: No chest pain Respiratory: No SOB  Gastrointestinal: See HPI and otherwise negative Genitourinary: No dysuria  Neurological: No headache, dizziness or syncope Musculoskeletal: No new muscle or joint pain Hematologic: No bruising Psychiatric: +anxiety    Physical Exam:  Vital signs: Pulse 100   Ht _0  (1.6 m)   Wt 125 lb 9.6 oz (57 kg)   LMP 03/11/2011   SpO2 98%   BMI 22.25 kg/m    Constitutional:   Pleasant  thin appearing Caucasian female appears to be in NAD, Well developed, Well nourished, alert and cooperative Head:  Normocephalic and atraumatic. Eyes:   PEERL, EOMI. No icterus. Conjunctiva pink. Ears:  Normal auditory acuity. Neck:  Supple Throat: Oral cavity and pharynx without inflammation, swelling or lesion.  Respiratory: Respirations even and unlabored. Lungs clear to auscultation bilaterally.   No wheezes, crackles, or rhonchi.  Cardiovascular: Normal S1, S2. No MRG. Regular rate and rhythm. No peripheral edema, cyanosis or pallor.  Gastrointestinal:  Soft, nondistended, mild-mod epigastric ttp, No rebound or guarding. Normal bowel sounds. No appreciable masses or hepatomegaly. Rectal:  Not performed.  Msk:  Symmetrical without gross  deformities. Without edema, no deformity  or joint abnormality.  Neurologic:  Alert and  oriented x4;  grossly normal neurologically.  Skin:   Dry and intact without significant lesions or rashes. Psychiatric: Demonstrates good judgement and reason without abnormal affect or behaviors.  RELEVANT LABS AND IMAGING: CBC    Component Value Date/Time   WBC 5.9 09/30/2021 1456   WBC 7.2 02/25/2021 1612   RBC 4.29 09/30/2021 1456   HGB 13.3 09/30/2021 1456   HGB 13.4 02/23/2017 1356   HCT 39.7 09/30/2021 1456   HCT 40.9 02/23/2017 1356   PLT 300 09/30/2021 1456   PLT 259 02/23/2017 1356   PLT 249 08/02/2016 1437   MCV 92.5 09/30/2021 1456   MCV 92.8 02/23/2017 1356   MCH 31.0 09/30/2021 1456   MCHC 33.5 09/30/2021 1456   RDW 12.0 09/30/2021 1456   RDW 12.9 02/23/2017 1356   LYMPHSABS 1.4 09/30/2021 1456   LYMPHSABS 1.5 02/23/2017 1356   MONOABS 0.3 09/30/2021 1456   MONOABS 0.4 02/23/2017 1356   EOSABS 0.1 09/30/2021 1456   EOSABS 0.1 02/23/2017 1356   BASOSABS 0.0 09/30/2021 1456   BASOSABS 0.0 02/23/2017 1356    CMP     Component Value Date/Time   NA 141 09/30/2021 1456   NA 142 02/23/2017 1356   K 3.9 09/30/2021 1456   K 4.2 02/23/2017 1356   CL 105 09/30/2021 1456   CL 104 09/23/2012 1415   CO2 30 09/30/2021 1456   CO2 27 02/23/2017 1356   GLUCOSE 86 09/30/2021 1456   GLUCOSE 81 02/23/2017 1356   GLUCOSE 96 09/23/2012 1415   BUN 9 09/30/2021 1456   BUN 11.2 02/23/2017 1356   CREATININE 0.91 09/30/2021 1456   CREATININE 0.9 02/23/2017 1356   CALCIUM 10.2 09/30/2021 1456   CALCIUM 9.9 02/23/2017 1356   PROT 7.3 09/30/2021 1456   PROT 7.2 02/23/2017 1356   ALBUMIN 4.9 09/30/2021 1456   ALBUMIN 4.2 02/23/2017 1356   AST 17 09/30/2021 1456   AST 25 02/23/2017 1356   ALT 14 09/30/2021 1456   ALT 19 02/23/2017 1356   ALKPHOS 83 09/30/2021 1456   ALKPHOS 91 02/23/2017 1356   BILITOT 0.6 09/30/2021 1456   BILITOT 0.49 02/23/2017 1356   GFRNONAA >60 09/30/2021  1456   GFRAA >60 08/11/2019 1204    Assessment: 1.  Abdominal pain: Generalized, sometimes worse with a bowel movement, also noticed a change to constipation, also epigastric pain with some hematemesis and hematochezia; likely gastritis +/- IBS +/- functional symptoms 2.  Hematochezia: Likely hemorrhoids but could represent polyp versus colitis 3.  Hematemesis: This was after some strong eructations, likely Mallory-Weiss/throat irritation 4.  Globus sensation and GERD: Patient is unwilling to take a PPI due to side effects at the moment; likely GERD 5.  Family history of colon cancer in her mother: Patient's last colonoscopy in 2019 with recommendations to repeat in 5 years  Plan: 1.  Due to patient's anxiety and myriad of symptoms we will go ahead and repeat EGD and colonoscopy.  These were scheduled with Dr. Silverio Decamp in the [facility].  Did provide the patient a detailed list of risks for the procedures and she agrees to proceed. Patient is appropriate for endoscopic procedure(s) in the ambulatory ([place]) setting.  2.  We discussed a PPI but patient would like to hold off.  We will start Pepcid 40 mg twice daily, every morning and nightly #60 with 3 refills. 3.  Patient requested to do a MiraLAX bowel prep given that she has  had trouble with vomiting previously with other preps. 4.  Patient to follow in clinic with Dr. Silverio Decamp after time of procedures per her recommendations.  Ellouise Newer, PA-C [facility] 02/16/2022, 11:18 AM  Cc: Hoyt Koch, *

## 2022-02-16 NOTE — Patient Instructions (Addendum)
_______________________________________________________  If you are age 52 or older, your body mass index should be between 23-30. Your Body mass index is 22.25 kg/m. If this is out of the aforementioned range listed, please consider follow up with your Primary Care Provider.  If you are age 74 or younger, your body mass index should be between 19-25. Your Body mass index is 22.25 kg/m. If this is out of the aformentioned range listed, please consider follow up with your Primary Care Provider.   ________________________________________________________  The [place] [facility] providers would like to encourage you to use [facility] to communicate with providers for non-urgent requests or questions.  Due to long hold times on the telephone, sending your provider a message by [facility] may be a faster and more efficient way to get a response.  Please allow 48 business hours for a response.  Please remember that this is for non-urgent requests.   You have been scheduled for an endoscopy and colonoscopy. Please follow the written instructions given to you at your visit today. Please pick up your prep supplies at the pharmacy within the next 1-3 days. If you use inhalers (even only as needed), please bring them with you on the day of your procedure.  _______________________________________________________  It was a pleasure to see you today!  Thank you for trusting me with your gastrointestinal care!

## 2022-02-17 ENCOUNTER — Other Ambulatory Visit: Payer: Self-pay

## 2022-02-17 MED ORDER — ONDANSETRON HCL 4 MG PO TABS: ORAL_TABLET | ORAL | 0 refills | Status: DC

## 2022-02-20 ENCOUNTER — Telehealth: Payer: Self-pay

## 2022-02-20 NOTE — Telephone Encounter (Signed)
Pt called in to the office. I spoke with her for 15 minutes. She had several questions regarding the upcoming procedures and her prep. Pt wanted to know what prep she had in 2019, that had 2 small clear bottles. I told pt that she had Plenvu but Suprep was sent to her pharmacy this time. Pt stated that she vomited after taking Plenvu. Pt stated that she did not hear good things about Suprep, I told pt that we prescribe this often and it is preferred by her insurance. Pt wanted to know some of the side effects. I told pt that she could become nauseated again and wanted to know what she could do to prevent that. I told pt that they could prescribe an antiemetic for her to take prior to each half of the prep. Pt also wanted to know how to complete the Miralax prep in case she can tolerate SUPREP. Pt was advised that we only recommend alternatives if she can't tolerate the prep. Pt wanted to schedule a follow up appt soon after her procedure to review results with Dr. Silverio Decamp. I informed pt that Dr. Silverio Decamp is completely booked until next year. I offered patient an appt with Anderson Malta, but she wanted to see Dr. Silverio Decamp. Pt asked what we did if she had important results. I explained to patient that the provider would call her is he had urgent results, otherwise a nurse would call, send MyChart message or letter with results and recommendations. I also informed patient that she will see Dr. Silverio Decamp before and after the procedure. I answered all of patient's questions and she had no concerns at the end of the call.

## 2022-02-22 ENCOUNTER — Ambulatory Visit (AMBULATORY_SURGERY_CENTER): Payer: Commercial Managed Care - HMO | Admitting: Gastroenterology

## 2022-02-22 ENCOUNTER — Encounter (HOSPITAL_BASED_OUTPATIENT_CLINIC_OR_DEPARTMENT_OTHER): Payer: Commercial Managed Care - HMO | Admitting: Physical Therapy

## 2022-02-22 ENCOUNTER — Encounter: Payer: Self-pay | Admitting: Gastroenterology

## 2022-02-22 VITALS — BP 146/77 | HR 61 | Temp 97.3°F | Resp 13 | Ht 63.0 in | Wt 125.0 lb

## 2022-02-22 DIAGNOSIS — K649 Unspecified hemorrhoids: Secondary | ICD-10-CM

## 2022-02-22 DIAGNOSIS — R1013 Epigastric pain: Secondary | ICD-10-CM

## 2022-02-22 DIAGNOSIS — R1084 Generalized abdominal pain: Secondary | ICD-10-CM | POA: Diagnosis not present

## 2022-02-22 DIAGNOSIS — K5900 Constipation, unspecified: Secondary | ICD-10-CM | POA: Diagnosis not present

## 2022-02-22 DIAGNOSIS — R194 Change in bowel habit: Secondary | ICD-10-CM

## 2022-02-22 DIAGNOSIS — G8929 Other chronic pain: Secondary | ICD-10-CM

## 2022-02-22 MED ORDER — LINACLOTIDE 145 MCG PO CAPS: 145.0000 ug | ORAL_CAPSULE | Freq: Every day | ORAL | 3 refills | Status: DC

## 2022-02-22 MED ORDER — SODIUM CHLORIDE 0.9 % IV SOLN: 500.0000 mL | Freq: Once | INTRAVENOUS | Status: DC

## 2022-02-22 MED ORDER — PANTOPRAZOLE SODIUM 40 MG PO TBEC: 40.0000 mg | DELAYED_RELEASE_TABLET | Freq: Every day | ORAL | 2 refills | Status: DC

## 2022-02-22 NOTE — Progress Notes (Unsigned)
Report to PACU, RN, vss, BBS= Clear.  

## 2022-02-22 NOTE — Progress Notes (Unsigned)
Prescriptions sent per MD order for Linzess and Protonix. Pt has appt for 04/25/21 at 2:30pm. No other availabilities at an earlier time. Pt sent home with CT contrast 2 bottles and blank CT instructions.

## 2022-02-22 NOTE — Op Note (Signed)
[place] Patient Name: [name] Procedure Date: 02/22/2022 2:34 PM MRN: [id number] Endoscopist: Mauri Pole , MD, [id number] Age: 51 Referring MD:  Date of Birth: 02/20/1971 Gender: Female Account #: [id number] Procedure:                Colonoscopy Indications:              Generalized abdominal pain, Change in bowel habits,                            Change in stool caliber, Constipation Medicines:                Monitored Anesthesia Care Procedure:                Pre-Anesthesia Assessment:                           - Prior to the procedure, a History and Physical                            was performed, and patient medications and                            allergies were reviewed. The patient's tolerance of                            previous anesthesia was also reviewed. The risks                            and benefits of the procedure and the sedation                            options and risks were discussed with the patient.                            All questions were answered, and informed consent                            was obtained. Prior Anticoagulants: The patient has                            taken no anticoagulant or antiplatelet agents. ASA                            Grade Assessment: II - A patient with mild systemic                            disease. After reviewing the risks and benefits,                            the patient was deemed in satisfactory condition to                            undergo the procedure.  After obtaining informed consent, the colonoscope                            was passed under direct vision. Throughout the                            procedure, the patient's blood pressure, pulse, and                            oxygen saturations were monitored continuously. The                            0441 PCF-H190TL Slim SB Colonoscope was introduced                            through  the anus and advanced to the the cecum,                            identified by appendiceal orifice and ileocecal                            valve. The colonoscopy was performed without                            difficulty. The patient tolerated the procedure                            well. The quality of the bowel preparation was                            excellent. The ileocecal valve, appendiceal                            orifice, and rectum were photographed. Scope In: 2:53:31 PM Scope Out: 3:11:29 PM Scope Withdrawal Time: 0 hours 9 minutes 25 seconds  Total Procedure Duration: 0 hours 17 minutes 58 seconds  Findings:                 The perianal and digital rectal examinations were                            normal.                           Non-bleeding internal hemorrhoids were found during                            retroflexion. The hemorrhoids were small.                           The exam was otherwise without abnormality. Complications:            No immediate complications. Estimated Blood Loss:     Estimated blood loss was minimal. Impression:               - Non-bleeding internal hemorrhoids.                           -  The examination was otherwise normal.                           - No specimens collected. Recommendation:           - Patient has a contact number available for                            emergencies. The signs and symptoms of potential                            delayed complications were discussed with the                            patient. Return to normal activities tomorrow.                            Written discharge instructions were provided to the                            patient.                           - Resume previous diet.                           - Continue present medications.                           - Repeat colonoscopy in 5 years for surveillance                            due to family history of colon cancer.                            - Start Linzess 127mg daily. Rx for 30 days with 3                            refills                           - Return to GI clinic at the next available                            appointment. KMauri Pole MD 02/22/2022 3:21:05 PM This report has been signed electronically.

## 2022-02-22 NOTE — Patient Instructions (Addendum)
Resume previous diet. Continue present medications.  Repeat colonoscopy in 5 years for surveillance due to family history of colon cancer.  Start Linzess 117mg daily for constipation.  Use Protonix (pantoprazole) '40mg'$  by mouth daily for 3 months. Stop Pepcid.  Return to GI clinic at the next available appointment. You already have an appointment scheduled with Dr. NSilverio Decampon Tuesday 04/25/22 at 2:30pm.  Schedule CT abdomen and pelvis for further evaluation of epigastric abdominal pain. My office will contact you with details. Contrast and blank instruction sheet sent home with you today.  Handout provided on hemorrhoids.     [facility]   Refer to the procedure report that was given to you for any specific questions about what was found during the examination.  If the procedure report does not answer your questions, please call your gastroenterologist to clarify.  If you requested that your care partner not be given the details of your procedure findings, then the procedure report has been included in a sealed envelope for you to review at your convenience later.  YOU SHOULD EXPECT: Some feelings of bloating in the abdomen. Passage of more gas than usual.  Walking can help get rid of the air that was put into your GI tract during the procedure and reduce the bloating. If you had a lower endoscopy (such as a colonoscopy or flexible sigmoidoscopy) you may notice spotting of blood in your stool or on the toilet paper. If you underwent a bowel prep for your procedure, you may not have a normal bowel movement for a few days.  Please Note:  You might notice some irritation and congestion in your nose or some drainage.  This is from the oxygen used during your procedure.  There is no need for concern and it should clear up in a day or so.  SYMPTOMS TO REPORT IMMEDIATELY:  Following lower endoscopy (colonoscopy or flexible  sigmoidoscopy):  Excessive amounts of blood in the stool  Significant tenderness or worsening of abdominal pains  Swelling of the abdomen that is new, acute  Fever of 100F or higher  Following upper endoscopy (EGD)  Vomiting of blood or coffee ground material  New chest pain or pain under the shoulder blades  Painful or persistently difficult swallowing  New shortness of breath  Fever of 100F or higher  Black, tarry-looking stools  For urgent or emergent issues, a gastroenterologist can be reached at any hour by calling ([phone] Do not use MyChart messaging for urgent concerns.    DIET:  We do recommend a small meal at first, but then you may proceed to your regular diet.  Drink plenty of fluids but you should avoid alcoholic beverages for 24 hours.  ACTIVITY:  You should plan to take it easy for the rest of today and you should NOT DRIVE or use heavy machinery until tomorrow (because of the sedation medicines used during the test).    FOLLOW UP: Our staff will call the number listed on your records the next business day following your procedure.  We will call around 7:15- 8:00 am to check on you and address any questions or concerns that you may have regarding the information given to you following your procedure. If we do not reach you, we will leave a message.     If any biopsies were taken you will be contacted by phone or by letter within the next 1-3 weeks.  Please call [place] [phone] if you have not heard about the biopsies in 3 weeks.    SIGNATURES/CONFIDENTIALITY: You and/or your care partner have signed paperwork which will be entered into your electronic medical record.  These signatures attest to the fact that that the information above on your After Visit Summary has been reviewed and is understood.  Full responsibility of the confidentiality of this discharge information lies with you and/or your care-partner.

## 2022-02-22 NOTE — Progress Notes (Unsigned)
Pt's states no medical or surgical changes since previsit or office visit. 

## 2022-02-22 NOTE — Op Note (Signed)
[facility] Patient Name: [name] Procedure Date: 02/22/2022 2:35 PM MRN: [id number] Endoscopist: Mauri Pole , MD, [id number] Age: 51 Referring MD:  Date of Birth: [birth year] Gender: Female Account #: [id number] Procedure:                Upper GI endoscopy Indications:              Epigastric abdominal pain Medicines:                Monitored Anesthesia Care Procedure:                Pre-Anesthesia Assessment:                           - Prior to the procedure, a History and Physical                            was performed, and patient medications and                            allergies were reviewed. The patient's tolerance of                            previous anesthesia was also reviewed. The risks                            and benefits of the procedure and the sedation                            options and risks were discussed with the patient.                            All questions were answered, and informed consent                            was obtained. Prior Anticoagulants: The patient has                            taken no anticoagulant or antiplatelet agents. ASA                            Grade Assessment: II - A patient with mild systemic                            disease. After reviewing the risks and benefits,                            the patient was deemed in satisfactory condition to                            undergo the procedure.                           After obtaining informed consent, the endoscope was  passed under direct vision. Throughout the                            procedure, the patient's blood pressure, pulse, and                            oxygen saturations were monitored continuously. The                            Endoscope was introduced through the mouth, and                            advanced to the second part of duodenum. The upper                            GI endoscopy was  accomplished without difficulty.                            The patient tolerated the procedure well. Scope In: Scope Out: Findings:                 The Z-line was regular and was found 36 cm from the                            incisors.                           No gross lesions were noted in the entire esophagus.                           The stomach was normal.                           The cardia and gastric fundus were normal on                            retroflexion.                           The first portion of the duodenum and second                            portion of the duodenum were normal. Biopsies for                            histology were taken with a cold forceps for                            evaluation of celiac disease. Biopsies for                            histology were taken with a cold forceps for                            evaluation of celiac disease.  Complications:            No immediate complications. Estimated Blood Loss:     Estimated blood loss was minimal. Impression:               - Z-line regular, 36 cm from the incisors.                           - No gross lesions in the entire esophagus.                           - Normal stomach.                           - Normal first portion of the duodenum and second                            portion of the duodenum. Biopsied. Recommendation:           - Patient has a contact number available for                            emergencies. The signs and symptoms of potential                            delayed complications were discussed with the                            patient. Return to normal activities tomorrow.                            Written discharge instructions were provided to the                            patient.                           - Resume previous diet.                           - Continue present medications.                           - Await pathology results.                            - Use Protonix (pantoprazole) 40 mg PO daily for 3                            months. DC pepcid                           - Schedule CT abd & pelvis for further evaluation                            of epigastric abdominal pain Mauri Pole, MD 02/22/2022 3:24:01 PM This report has been signed electronically.

## 2022-02-22 NOTE — Progress Notes (Signed)
Called to room to assist during endoscopic procedure.  Patient ID and intended procedure confirmed with present staff. Received instructions for my participation in the procedure from the performing physician.  

## 2022-02-22 NOTE — Progress Notes (Unsigned)
Please refer to office visit note 02/16/22 by Ellouise Newer. No additional changes in H&P Patient is appropriate for planned procedure(s) and anesthesia in an ambulatory setting  K. Denzil Magnuson , MD [phone]

## 2022-02-23 ENCOUNTER — Ambulatory Visit (HOSPITAL_BASED_OUTPATIENT_CLINIC_OR_DEPARTMENT_OTHER): Payer: Commercial Managed Care - HMO | Admitting: Physical Therapy

## 2022-02-23 ENCOUNTER — Telehealth: Payer: Self-pay

## 2022-02-23 ENCOUNTER — Other Ambulatory Visit: Payer: Self-pay

## 2022-02-23 DIAGNOSIS — G8929 Other chronic pain: Secondary | ICD-10-CM

## 2022-02-23 DIAGNOSIS — R194 Change in bowel habit: Secondary | ICD-10-CM

## 2022-02-23 DIAGNOSIS — K5909 Other constipation: Secondary | ICD-10-CM

## 2022-02-23 NOTE — Telephone Encounter (Signed)
Attempted f/u call. No answer, left VM. 

## 2022-02-28 ENCOUNTER — Telehealth: Payer: Self-pay | Admitting: Gastroenterology

## 2022-02-28 NOTE — Telephone Encounter (Signed)
Dr Silverio Decamp please review when back in the office. Appt has been cancelled and pt aware.

## 2022-02-28 NOTE — Telephone Encounter (Signed)
CT [facility] AND PELVIS,W CONTRAST [[id number]   Case was denied by [employer]:   Based on [employer] Abdomen Imaging Guidelines Section(s): Epigastric Pain and Dyspepsia (AB 2.5) and Preface to the Imaging Guidelines, section Preface-3 Clinical Information, we cannot approve this request. Your healthcare provider told us that you have pain in the upper part of your belly (abdomen). The request cannot be approved because:  You must have a negative or unclear ultrasound (a picture study using sound waves). You must have symptoms that persist after a four to eight week trial of proton pump inhibitors (drugs that lower acid in your stomach). Picture studies of more than one body region are not needed to show your doctor the details needed to treat you. A picture study of one body region is sufficient.  Please advise if a peer to peer needs to be scheduled.

## 2022-03-01 ENCOUNTER — Encounter (HOSPITAL_BASED_OUTPATIENT_CLINIC_OR_DEPARTMENT_OTHER): Payer: Self-pay | Admitting: Physical Therapy

## 2022-03-01 ENCOUNTER — Ambulatory Visit (HOSPITAL_BASED_OUTPATIENT_CLINIC_OR_DEPARTMENT_OTHER): Payer: Commercial Managed Care - HMO | Admitting: Physical Therapy

## 2022-03-01 DIAGNOSIS — M542 Cervicalgia: Secondary | ICD-10-CM | POA: Diagnosis not present

## 2022-03-01 DIAGNOSIS — M6281 Muscle weakness (generalized): Secondary | ICD-10-CM

## 2022-03-01 DIAGNOSIS — M5459 Other low back pain: Secondary | ICD-10-CM

## 2022-03-01 NOTE — Therapy (Signed)
[name]  OUTPATIENT PHYSICAL THERAPY THORACOLUMBAR TREATMENT   Patient Name: [name] MRN: [id number] DOB:Jul 26, 1970, 51 y.o., female Today's Date: 03/01/2022   PT End of Session - 03/01/22 1309     Visit Number 15    Number of Visits 20    Date for PT Re-Evaluation 04/10/22    Authorization Type [employer]    PT Start Time 1310   arrives late   PT Stop Time 1340    PT Time Calculation (min) 30 min    Activity Tolerance Patient tolerated treatment well    Behavior During Therapy [facility] for tasks assessed/performed                       Past Medical History:  Diagnosis Date   Abdominal pain, epigastric 11/20/2019   Asthma    as a child   Cervical dysplasia    Gastroesophageal reflux disease 11/20/2019   Genetic testing 02/01/2018   Genetic testing was performed in 2015. The [employer] panel was ordered. In total, 23 genes were analyzed as part of this panel: ATM, BARD1, BRCA1, BRCA2, BRIP1, CDH1, CHEK2, EPCAM, MLH1, MRE11A, MSH2, MSH6, MUTYH, NBN, NF1, PALB2, PMS2, PTEN, RAD50, RAD51C, RAD51D, STK11, and TP53. No pathogenic variants were detected. Genetic testing did detect a Variant of Unknown Significance (VUS) at the t   GERD (gastroesophageal reflux disease)    had during chemo   Headache 06/12/2013   History of chemotherapy    History of kidney stones    History of radiation therapy 11/24/13- 01/08/14   reconstructed right breast/chest wall 4500 cGy 25 sessions, electron beam boost 1440 cGy 8 sessions   Liver masses 12/06/2016   Major depressive disorder, in remission 03/12/2017   Malignant neoplasm of lower-inner quadrant of right breast of female, estrogen receptor positive 2012   Pneumonia    PONV (postoperative nausea and vomiting)    Recurrent cancer of right breast 12/22/2013   Thrombocytopenia 05/22/2013   Past Surgical History:  Procedure Laterality Date   ADENOIDECTOMY     BREAST LUMPECTOMY WITH NEEDLE LOCALIZATION Right 05/05/2013   Procedure: EXCISION  RECURRENT CANCER RIGHT BREAST WITH NEEDLE LOCALOZATION;  Surgeon: Adin Hector, MD;  Location: [facility];  Service: General;  Laterality: Right;   BREAST RECONSTRUCTION  06/29/2011   Procedure: BREAST RECONSTRUCTION;  Surgeon: Theodoro Kos, DO;  Location: [facility];  Service: Plastics;  Laterality: Bilateral;  Immediate Bilateral Breast Reconstruction with Bilateral Tissue Expanders and placement of  Alloderm    DILATION AND CURETTAGE OF UTERUS     fibroid tumor surgery     lazy eye     corrective surgery   MASTECTOMY W/ SENTINEL NODE BIOPSY  06/29/2011   Procedure: MASTECTOMY WITH SENTINEL LYMPH NODE BIOPSY;  Surgeon: Adin Hector, MD;  Location: [place];  Service: General;  Laterality: Right;  right skin spairing mstectomy and right sentinel lymph node biopsy   PORT-A-CATH REMOVAL  03/12/2012   Procedure: MINOR REMOVAL PORT-A-CATH;  Surgeon: Adin Hector, MD;  Location: [place];  Service: General;  Laterality: N/A;  Upper left   PORTACATH PLACEMENT Left 05/05/2013   Procedure: INSERTION PORT-A-CATH;  Surgeon: Adin Hector, MD;  Location: [facility];  Service: General;  Laterality: Left;   RHINOPLASTY     uterine cervical treatments  2002   for precancerous lesions   Patient Active Problem List   Diagnosis Date Noted   Snoring 07/25/2021   Excessive daytime sleepiness 07/25/2021  Routine general medical examination at a health care facility 07/08/2021   Positive ANA (antinuclear antibody) 03/18/2021   Left leg pain 03/18/2021   Arthralgia 03/11/2021   Back pain 02/26/2021   Neck pain 02/26/2021   Gastroesophageal reflux disease 11/20/2019   Major depressive disorder, in remission 03/12/2017   Liver masses 12/06/2016   Recurrent cancer of right breast 12/22/2013   Headache 06/12/2013   Thrombocytopenia 05/22/2013   Malignant neoplasm of lower-inner quadrant of right breast of female, estrogen receptor positive 2012     REFERRING PROVIDER: Gardenia Phlegm, NP  REFERRING DIAG:  C50.311,Z17.0 (ICD-10-CM) - Malignant neoplasm of lower-inner quadrant of right breast of female, estrogen receptor positive ([place])  M54.50,G89.29 (ICD-10-CM) - Chronic midline low back pain without sciatica    Rationale for Evaluation and Treatment Rehabilitation  THERAPY DIAG:  Cervicalgia  Other low back pain  Muscle weakness (generalized)  ONSET DATE: chronic  SUBJECTIVE:                                                                                                                                                                                           SUBJECTIVE STATEMENT: Pt states she has low back spasms but recently she has been GI issues and is concerned about family history of colon cancer. Pt states she has had increase in joint pain with new protonix. Pt states she was finally able to have a bowel movement after 1 week with new changes in meds.    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 Yes: NPRS scale: 3/10; 3/10 Pain location: low back, neck Pain description: sore Aggravating factors: constant pain Relieving factors: excedrin tension   PRECAUTIONS: None  WEIGHT BEARING RESTRICTIONS No  FALLS:  Has patient fallen in last 6 months? No  PLOF: Independent  PATIENT GOALS decrease pain   OBJECTIVE:   DIAGNOSTIC FINDINGS:  Oct 2022 MRI- hemangioma at L4, fatty infiltration to sacrum.   Cervical AROM 10/12  flexion WFL  extension WFL p!   L rotation WFL  R rotation WFL  L flexion WFL  R  flexion WFL   (Blank rows = not tested)  UE MMT: 4/5 throughout; pain into mid thoracic area with resisted flexion and ABD  Light touch sensation throughout C4-C7 WFL   LUMBAR ROM:    WFL   LOWER EXTREMITY ROM:      WFL   LOWER EXTREMITY MMT:     MMT Right 10/12 Left 10/12  Hip flexion  4+/5  4+/5  Hip extension      Hip abduction  4+/5  4+/5  Hip  adduction  4+/5  4+/5  Hip internal rotation    4+/5  4+/5  Hip external rotation   4+/5    4+/5   (Blank rows = not tested)   TODAY'S TREATMENT   Treatment                            03/01/22:  Cable chop 2x10 10lbs (trunk and hips turn together) Cable paloff 15lbs 3x5 Unilateral seated cable row 3x8 20lbs (allow for slight T/S flex and ext today) Side plank with hip ABD 3s 10x   Treatment                            02/15/22:  Core contraction Wall bridge + UE flexion/ext Legs extended heels on wall- upper crucnh reach to ceiling, leg lift to 90 hip flexion, +pelvic lift Side plank knee/elbow with UE horiz abd/add Bird dog knee to elbow Primal push up   Treatment                            02/08/22:  MANUAL: STM Lt QL, Lt piriformis, manual traction for QL stretch on Lt side; passive LE stretching- HS & piriformis Hooklying ball squeeze with ab set Hooklying ab set with LE ext Ab set with step up to TT Bird dog progression- started with single extremity for core contraction- full bird dog- bird dog with tap/lift Primal push up   10/25  Nustep L4 6 min UE and LE  Shoulder rolls AP and PA 2lbs 2x10 RTB horizontal ABD 2x8 Gray shuttle leg press 25lbs 3x8 RDL at wall 10lbs 10x, 2x10 20lbs  Farmer's carry 10lbs each 46f x3 Bosu squat 2x10 Unilateral seated cable row 3x8 20lbs     PATIENT EDUCATION:  Education details: AGeophysicist/field seismologistof condition, POC, HEP, exercise form/rationale Person educated: Patient Education method: Explanation, Demonstration, Tactile cues, Verbal cues, and Handouts Education comprehension: verbalized understanding, returned demonstration, verbal cues required, tactile cues required, and needs further education   HOME EXERCISE PROGRAM: Access Code: [email or web address]: [email or web address]   ASSESSMENT:  CLINICAL IMPRESSION: Pt without irritation of GI issues today with increase intensity of loading today. Pt able to progress lumbar,  T/S, and lateral hip loading today without increase in pain. Pt is beginning to improve from a MSK standpoint but may have likely GI contribution to lumbar pain. Plan to continue with progression towards gym exercise as pt is considering gym membership in the future. Pt would benefit from continued skilled therapy in order to reach goals and maximize functional lumbopelvic and shoulder girdle strength for return to painfree ADL.    OBJECTIVE IMPAIRMENTS decreased activity tolerance, difficulty walking, increased muscle spasms, postural dysfunction, and pain.   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   REHAB POTENTIAL: Good  CLINICAL DECISION MAKING: Stable/uncomplicated  EVALUATION COMPLEXITY: Low   GOALS:  SHORT TERM GOALS: Target date:12/06/21  Will determine need for heel lift Baseline: Goal status: achieved   LONG TERM GOALS: Target date: 04/10/2022   Bil hip abd strength to meet age appropriate levels per hand held dynamometry Baseline:  Goal  status: Initial  2.  Able to demonstrate proper form in long term core strength Baseline:  Goal status: INITIAL  3.  Pt will be able to tolerate sitting or standing for 30 minute periods without increase in pain Baseline:  Goal status: partially met.  4. Pt will be able to demonstrate/report ability to sit/stand/sleep for extended periods of time without pain in order to demonstrate functional improvement and tolerance to static positioning.  Goal Status: Initial  5. Pt will be able to lift/squat/hold >20 lbs in order to demonstrate functional improvement in lumbopelvic and UE strength for return to  ADL/household duties.  Goal Status: initial       PLAN: PT FREQUENCY: 1-2x/week  PT DURATION: 8 weeks  PLANNED INTERVENTIONS: Therapeutic exercises, Therapeutic activity, Neuromuscular re-education, Balance training, Gait training, Patient/Family education, Self Care, Joint mobilization, Stair training, Aquatic Therapy, Spinal mobilization, Cryotherapy, Moist heat, Taping, Manual therapy, and Re-evaluation.  PLAN FOR NEXT SESSION: TPDN PRN, continue with core stability training, hip hinge, increase resistance load;   Daleen Bo PT, DPT 03/01/22 1:44 PM

## 2022-03-06 ENCOUNTER — Other Ambulatory Visit: Payer: Self-pay | Admitting: Neurology

## 2022-03-06 ENCOUNTER — Ambulatory Visit (HOSPITAL_COMMUNITY): Payer: Commercial Managed Care - HMO

## 2022-03-06 ENCOUNTER — Encounter: Payer: Self-pay | Admitting: Neurology

## 2022-03-06 MED ORDER — METAXALONE 400 MG PO TABS: 400.0000 mg | ORAL_TABLET | Freq: Three times a day (TID) | ORAL | 5 refills | Status: DC | PRN

## 2022-03-06 NOTE — Telephone Encounter (Signed)
Patient called wanting to follow up on previous message regarding scan denial.

## 2022-03-06 NOTE — Telephone Encounter (Signed)
Looks like oncology has ordered PET/CT, hopefully they will be able to get it approved.  Thanks

## 2022-03-06 NOTE — Telephone Encounter (Signed)
See note per Dr Silverio Decamp

## 2022-03-07 ENCOUNTER — Other Ambulatory Visit: Payer: Self-pay

## 2022-03-07 DIAGNOSIS — K219 Gastro-esophageal reflux disease without esophagitis: Secondary | ICD-10-CM

## 2022-03-07 DIAGNOSIS — G8929 Other chronic pain: Secondary | ICD-10-CM

## 2022-03-07 DIAGNOSIS — R194 Change in bowel habit: Secondary | ICD-10-CM

## 2022-03-07 NOTE — Telephone Encounter (Signed)
Ok please order complete abdominal ultrasound. Thanks

## 2022-03-07 NOTE — Telephone Encounter (Signed)
The PET/CT order is not current. Do you want to do a peer to peer or go with the abd u/s? Patient is currently scheduled for the CT on 12/7 but this will get canceled by radiology if we do not have authorization 72 hours prior.

## 2022-03-07 NOTE — Telephone Encounter (Signed)
Scheduled for 03/08/22. Patient informed.

## 2022-03-07 NOTE — Telephone Encounter (Signed)
Pt called this am regarding CT/PET ordered by Dr Nandigam/GI that was denied by insurance.  She states that [facility] called to schedule. It is scheduled for 12/7 but doesn't look like it has been approved with insurance. The pt thinks that Nandigam's office spoke to someone in our office & turned over to [place].  Pt was a pt of Dr Magrinat/[employer] & there was an order from 2017 which as expired & no longer valid.  Encouraged pt to call GI office to discuss b/c GI related.  Message to our providers to review & to Dr Silverio Decamp.

## 2022-03-07 NOTE — Telephone Encounter (Signed)
Patient called with additional information requested to speak with you.

## 2022-03-08 ENCOUNTER — Ambulatory Visit (HOSPITAL_BASED_OUTPATIENT_CLINIC_OR_DEPARTMENT_OTHER)
Admission: RE | Admit: 2022-03-08 | Discharge: 2022-03-08 | Disposition: A | Discharge status: Home / Self Care | Payer: Commercial Managed Care - HMO | Source: Ambulatory Visit | Attending: Gastroenterology | Admitting: Gastroenterology

## 2022-03-08 DIAGNOSIS — G8929 Other chronic pain: Secondary | ICD-10-CM | POA: Diagnosis present

## 2022-03-08 DIAGNOSIS — R1013 Epigastric pain: Secondary | ICD-10-CM | POA: Insufficient documentation

## 2022-03-08 DIAGNOSIS — K219 Gastro-esophageal reflux disease without esophagitis: Secondary | ICD-10-CM | POA: Diagnosis present

## 2022-03-10 NOTE — Telephone Encounter (Signed)
Her abdominal u/s was negative.  The CT needs to get approval. It is scheduled for 03/16/22.

## 2022-03-14 ENCOUNTER — Telehealth: Payer: Self-pay | Admitting: Gastroenterology

## 2022-03-14 NOTE — Telephone Encounter (Signed)
The CT is on 03/16/22. I have sent messages to our referral coordinators.

## 2022-03-14 NOTE — Telephone Encounter (Signed)
Patient is calling doesn't know if she needs to go to her CT scan appointment or not. Please advise

## 2022-03-15 ENCOUNTER — Encounter (HOSPITAL_BASED_OUTPATIENT_CLINIC_OR_DEPARTMENT_OTHER): Payer: Self-pay | Admitting: Physical Therapy

## 2022-03-15 ENCOUNTER — Ambulatory Visit (HOSPITAL_BASED_OUTPATIENT_CLINIC_OR_DEPARTMENT_OTHER): Payer: Commercial Managed Care - HMO | Attending: Orthopaedic Surgery | Admitting: Physical Therapy

## 2022-03-15 ENCOUNTER — Other Ambulatory Visit: Payer: Self-pay

## 2022-03-15 ENCOUNTER — Encounter: Payer: Self-pay | Admitting: Gastroenterology

## 2022-03-15 DIAGNOSIS — M6281 Muscle weakness (generalized): Secondary | ICD-10-CM | POA: Diagnosis present

## 2022-03-15 DIAGNOSIS — M5459 Other low back pain: Secondary | ICD-10-CM | POA: Diagnosis present

## 2022-03-15 DIAGNOSIS — M545 Low back pain, unspecified: Secondary | ICD-10-CM

## 2022-03-15 DIAGNOSIS — R194 Change in bowel habit: Secondary | ICD-10-CM

## 2022-03-15 DIAGNOSIS — M542 Cervicalgia: Secondary | ICD-10-CM

## 2022-03-15 DIAGNOSIS — R293 Abnormal posture: Secondary | ICD-10-CM

## 2022-03-15 DIAGNOSIS — R1084 Generalized abdominal pain: Secondary | ICD-10-CM

## 2022-03-15 NOTE — Telephone Encounter (Signed)
Spoke with the patient. The CT scan is approved for [facility]. The CT is scheduled at [facility] at [facility].  Canceled the appointment at [facility] and requested an appointment with [place]. This delay of care is noted by the patient. Apologized for this. Order to [place] marked ASAP.  Patient reports continued abdominal pain, mid to low back pain. Describes the pain as a burning sensation. She would like to consult with Urology about the renal calculi seen on the sonogram. Per patient request, records faxed to [facility]. Patient will set up her appointment.

## 2022-03-15 NOTE — Therapy (Signed)
[name]  OUTPATIENT PHYSICAL THERAPY THORACOLUMBAR TREATMENT   Patient Name: [name] MRN: [id number] DOB:1971-03-25, 51 y.o., female Today's Date: 03/15/2022   PT End of Session - 03/15/22 1325     Visit Number 16    Number of Visits 20    Date for PT Re-Evaluation 04/10/22    Authorization Type [employer]    PT Start Time 1310   arrived late   PT Stop Time 1343    PT Time Calculation (min) 33 min    Activity Tolerance Patient tolerated treatment well    Behavior During Therapy [facility] for tasks assessed/performed                        Past Medical History:  Diagnosis Date   Abdominal pain, epigastric 11/20/2019   Asthma    as a child   Cervical dysplasia    Gastroesophageal reflux disease 11/20/2019   Genetic testing 02/01/2018   Genetic testing was performed in 2015. The [employer] OvaNext panel was ordered. In total, 23 genes were analyzed as part of this panel: ATM, BARD1, BRCA1, BRCA2, BRIP1, CDH1, CHEK2, EPCAM, MLH1, MRE11A, MSH2, MSH6, MUTYH, NBN, NF1, PALB2, PMS2, PTEN, RAD50, RAD51C, RAD51D, STK11, and TP53. No pathogenic variants were detected. Genetic testing did detect a Variant of Unknown Significance (VUS) at the t   GERD (gastroesophageal reflux disease)    had during chemo   Headache 06/12/2013   History of chemotherapy    History of kidney stones    History of radiation therapy 11/24/13- 01/08/14   reconstructed right breast/chest wall 4500 cGy 25 sessions, electron beam boost 1440 cGy 8 sessions   Liver masses 12/06/2016   Major depressive disorder, in remission 03/12/2017   Malignant neoplasm of lower-inner quadrant of right breast of female, estrogen receptor positive 2012   Pneumonia    PONV (postoperative nausea and vomiting)    Recurrent cancer of right breast 12/22/2013   Thrombocytopenia 05/22/2013   Past Surgical History:  Procedure Laterality Date   ADENOIDECTOMY     BREAST LUMPECTOMY WITH NEEDLE LOCALIZATION Right 05/05/2013   Procedure: EXCISION  RECURRENT CANCER RIGHT BREAST WITH NEEDLE LOCALOZATION;  Surgeon: Adin Hector, MD;  Location: [place];  Service: General;  Laterality: Right;   BREAST RECONSTRUCTION  06/29/2011   Procedure: BREAST RECONSTRUCTION;  Surgeon: Theodoro Kos, DO;  Location: [place];  Service: Plastics;  Laterality: Bilateral;  Immediate Bilateral Breast Reconstruction with Bilateral Tissue Expanders and placement of  Alloderm    DILATION AND CURETTAGE OF UTERUS     fibroid tumor surgery     lazy eye     corrective surgery   MASTECTOMY W/ SENTINEL NODE BIOPSY  06/29/2011   Procedure: MASTECTOMY WITH SENTINEL LYMPH NODE BIOPSY;  Surgeon: Adin Hector, MD;  Location: [place];  Service: General;  Laterality: Right;  right skin spairing mstectomy and right sentinel lymph node biopsy   PORT-A-CATH REMOVAL  03/12/2012   Procedure: MINOR REMOVAL PORT-A-CATH;  Surgeon: Adin Hector, MD;  Location: [place];  Service: General;  Laterality: N/A;  Upper left   PORTACATH PLACEMENT Left 05/05/2013   Procedure: INSERTION PORT-A-CATH;  Surgeon: Adin Hector, MD;  Location: [place];  Service: General;  Laterality: Left;   RHINOPLASTY     uterine cervical treatments  2002   for precancerous lesions   Patient Active Problem List   Diagnosis Date Noted   Snoring 07/25/2021   Excessive daytime sleepiness 07/25/2021  Routine general medical examination at a health care facility 07/08/2021   Positive ANA (antinuclear antibody) 03/18/2021   Left leg pain 03/18/2021   Arthralgia 03/11/2021   Back pain 02/26/2021   Neck pain 02/26/2021   Gastroesophageal reflux disease 11/20/2019   Major depressive disorder, in remission 03/12/2017   Liver masses 12/06/2016   Recurrent cancer of right breast 12/22/2013   Headache 06/12/2013   Thrombocytopenia 05/22/2013   Malignant neoplasm of lower-inner quadrant of right breast of female, estrogen receptor positive 2012     REFERRING PROVIDER: Gardenia Phlegm, NP  REFERRING DIAG:  C50.311,Z17.0 (ICD-10-CM) - Malignant neoplasm of lower-inner quadrant of right breast of female, estrogen receptor positive ([place])  M54.50,G89.29 (ICD-10-CM) - Chronic midline low back pain without sciatica    Rationale for Evaluation and Treatment Rehabilitation  THERAPY DIAG:  Cervicalgia  Muscle weakness (generalized)  Abnormal posture  Other low back pain  ONSET DATE: chronic  SUBJECTIVE:                                                                                                                                                                                           SUBJECTIVE STATEMENT:  I wasn't thinking when I left the house this morning, I'm not in PT attire. I've been trying to hinge but I think I've been using the wrong muscles, they found a kidney stone when I looked it up it looks too big to me to pass. Haven't seen nephrologist yet. I've been having back spasms I want you to trigger point point release. Gastrologist prescribed me Linzest I think but I stopped that because of diarrhea. They changed my muscle relaxer at my request due to blurred vision and memory issues and I think it caused gastric stomach problems. Neck is OK I'm managing there's pain at times but I can get over it more than the back. Doctors aren't sure how much gastric pain is playing into back pain. CT was supposed to be tomorrow but had to postpone due to insurance.    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 Yes: NPRS scale: 5/10 Pain location: low back, neck Pain description: sore Aggravating factors: constant pain Relieving factors: excedrin tension   PRECAUTIONS: None  WEIGHT BEARING RESTRICTIONS No  FALLS:  Has patient fallen in last 6 months? No  PLOF: Independent  PATIENT GOALS decrease pain   OBJECTIVE:   DIAGNOSTIC FINDINGS:  Oct 2022 MRI- hemangioma at L4, fatty infiltration  to sacrum.   Cervical AROM 10/12  flexion WFL  extension WFL p!   L  rotation WFL  R rotation WFL  L flexion WFL  R  flexion WFL   (Blank rows = not tested)  UE MMT: 4/5 throughout; pain into mid thoracic area with resisted flexion and ABD  Light touch sensation throughout C4-C7 WFL   LUMBAR ROM:    WFL   LOWER EXTREMITY ROM:      WFL   LOWER EXTREMITY MMT:     MMT Right 10/12 Left 10/12  Hip flexion  4+/5  4+/5  Hip extension      Hip abduction   4+/5  4+/5  Hip adduction  4+/5  4+/5  Hip internal rotation    4+/5  4+/5  Hip external rotation   4+/5    4+/5   (Blank rows = not tested)   TODAY'S TREATMENT   03/15/22  TherEx  PPT with LE extensions x10 B DKTC stretch for lumbar region 5x10 seconds Lumbar rotation stretch 5x5 seconds B Checked hip hinges- did ok but will need more work here needed has poor biomechanics      Manual  Self trigger point release using tennis ball to lumbar spine (supine) Tennis ball MFR B lumbar spine in prone       Treatment                            03/01/22:  Cable chop 2x10 10lbs (trunk and hips turn together) Cable paloff 15lbs 3x5 Unilateral seated cable row 3x8 20lbs (allow for slight T/S flex and ext today) Side plank with hip ABD 3s 10x   Treatment                            02/15/22:  Core contraction Wall bridge + UE flexion/ext Legs extended heels on wall- upper crucnh reach to ceiling, leg lift to 90 hip flexion, +pelvic lift Side plank knee/elbow with UE horiz abd/add Bird dog knee to elbow Primal push up   Treatment                            02/08/22:  MANUAL: STM Lt QL, Lt piriformis, manual traction for QL stretch on Lt side; passive LE stretching- HS & piriformis Hooklying ball squeeze with ab set Hooklying ab set with LE ext Ab set with step up to [employer] dog progression- started with single extremity for core contraction- full bird dog- bird dog with tap/lift Primal push  up   10/25  Nustep L4 6 min UE and LE  Shoulder rolls AP and PA 2lbs 2x10 RTB horizontal ABD 2x8 Gray shuttle leg press 25lbs 3x8 RDL at wall 10lbs 10x, 2x10 20lbs  Farmer's carry 10lbs each 58f x3 Bosu squat 2x10 Unilateral seated cable row 3x8 20lbs     PATIENT EDUCATION:  Education details: AGeophysicist/field seismologistof condition, POC, HEP, exercise form/rationale Person educated: Patient Education method: Explanation, Demonstration, Tactile cues, Verbal cues, and Handouts Education comprehension: verbalized understanding, returned demonstration, verbal cues required, tactile cues required, and needs further education   HOME EXERCISE PROGRAM: Access Code: [email or web address]: [email or web address]   ASSESSMENT:  CLINICAL IMPRESSION:  SDoniarrives today not feeling great, did arrive late and then attempted to dictate session with requests for manual therapy. PT educated that we need to perform functional exercises during session as well- worked on core activation and lumbar stretching/mobility and performed limited MFR at end of session.  Continued to encourage use of self trigger point release but pt stated she is not able to get the same relief doing this on her own even with theracane, encouraged this with tennis ball in supine instead and practiced this during session. Continued to encourage moist heat at home.    OBJECTIVE IMPAIRMENTS decreased activity tolerance, difficulty walking, increased muscle spasms, postural dysfunction, and pain.   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   REHAB POTENTIAL: Good  CLINICAL DECISION MAKING:  Stable/uncomplicated  EVALUATION COMPLEXITY: Low   GOALS:  SHORT TERM GOALS: Target date:12/06/21  Will determine need for heel lift Baseline: Goal status: achieved   LONG TERM GOALS: Target date: 04/10/2022   Bil hip abd strength to meet age appropriate levels per hand held dynamometry Baseline:  Goal status: Initial  2.  Able to demonstrate proper form in long term core strength Baseline:  Goal status: INITIAL  3.  Pt will be able to tolerate sitting or standing for 30 minute periods without increase in pain Baseline:  Goal status: partially met.  4. Pt will be able to demonstrate/report ability to sit/stand/sleep for extended periods of time without pain in order to demonstrate functional improvement and tolerance to static positioning.  Goal Status: Initial  5. Pt will be able to lift/squat/hold >20 lbs in order to demonstrate functional improvement in lumbopelvic and UE strength for return to ADL/household duties.  Goal Status: initial       PLAN: PT FREQUENCY: 1-2x/week  PT DURATION: 8 weeks  PLANNED INTERVENTIONS: Therapeutic exercises, Therapeutic activity, Neuromuscular re-education, Balance training, Gait training, Patient/Family education, Self Care, Joint mobilization, Stair training, Aquatic Therapy, Spinal mobilization, Cryotherapy, Moist heat, Taping, Manual therapy, and Re-evaluation.  PLAN FOR NEXT SESSION: TPDN PRN, continue with core stability training, continue to practice hip hinges, increase resistance load;    Markcus Lazenby U PT DPT PN2  03/15/2022, 1:43 PM

## 2022-03-16 ENCOUNTER — Ambulatory Visit (INDEPENDENT_AMBULATORY_CARE_PROVIDER_SITE_OTHER): Payer: Commercial Managed Care - HMO | Admitting: General Practice

## 2022-03-16 ENCOUNTER — Ambulatory Visit: Payer: Commercial Managed Care - HMO | Admitting: Obstetrics

## 2022-03-16 ENCOUNTER — Ambulatory Visit (HOSPITAL_BASED_OUTPATIENT_CLINIC_OR_DEPARTMENT_OTHER): Payer: Commercial Managed Care - HMO

## 2022-03-16 ENCOUNTER — Encounter: Payer: Self-pay | Admitting: Gastroenterology

## 2022-03-16 VITALS — BP 101/70 | HR 85 | Ht 60.0 in | Wt 125.0 lb

## 2022-03-16 DIAGNOSIS — R300 Dysuria: Secondary | ICD-10-CM | POA: Diagnosis not present

## 2022-03-16 LAB — POCT URINALYSIS DIPSTICK
Bilirubin, UA: NEGATIVE
Blood, UA: NEGATIVE
Glucose, UA: NEGATIVE
Ketones, UA: NEGATIVE
Leukocytes, UA: NEGATIVE
Nitrite, UA: NEGATIVE
Protein, UA: NEGATIVE
Spec Grav, UA: 1.015 (ref 1.010–1.025)
Urobilinogen, UA: 1 E.U./dL
pH, UA: 6 (ref 5.0–8.0)

## 2022-03-16 MED ORDER — NITROFURANTOIN MONOHYD MACRO 100 MG PO CAPS: 100.0000 mg | ORAL_CAPSULE | Freq: Two times a day (BID) | ORAL | 0 refills | Status: DC

## 2022-03-16 NOTE — Progress Notes (Signed)
SUBJECTIVE: [name] is a 51 y.o. female who complains of urinary frequency, urgency and dysuria x 7 days, without flank pain, fever, chills, or abnormal vaginal discharge or bleeding.   Pt also c/o back and abdominal pain 10/10 and is requesting pain medication.   OBJECTIVE: Appears well, in no apparent distress.  Vital signs are normal. Urine dipstick shows negative for all components.    ASSESSMENT: Dysuria  PLAN: Macrobid sent to pharmacy per protocol. Pt advised to use OTC pain medication for pain and go to urgent care of the ED if symptoms worsen. Pt can also call or return to clinic prn if these symptoms worsen or fail to improve as anticipated. Further treatment to be determined once lab results are received.

## 2022-03-20 ENCOUNTER — Encounter: Payer: Self-pay | Admitting: Obstetrics

## 2022-03-20 ENCOUNTER — Telehealth: Payer: Self-pay | Admitting: Emergency Medicine

## 2022-03-20 NOTE — Telephone Encounter (Signed)
TC to patient to discuss rescheduling urine culture, as urine culture was not sent to Lab from [facility] due to error. Pt states that she will need to call back to schedule.

## 2022-03-21 ENCOUNTER — Ambulatory Visit
Admission: RE | Admit: 2022-03-21 | Discharge: 2022-03-21 | Disposition: A | Discharge status: Home / Self Care | Payer: Commercial Managed Care - HMO | Source: Ambulatory Visit | Attending: Gastroenterology | Admitting: Gastroenterology

## 2022-03-21 ENCOUNTER — Ambulatory Visit: Payer: Commercial Managed Care - HMO

## 2022-03-21 DIAGNOSIS — R300 Dysuria: Secondary | ICD-10-CM

## 2022-03-21 DIAGNOSIS — R1084 Generalized abdominal pain: Secondary | ICD-10-CM

## 2022-03-21 DIAGNOSIS — M545 Low back pain, unspecified: Secondary | ICD-10-CM

## 2022-03-21 DIAGNOSIS — R194 Change in bowel habit: Secondary | ICD-10-CM

## 2022-03-21 MED ORDER — IOPAMIDOL (ISOVUE-300) INJECTION 61%
100.0000 mL | Freq: Once | INTRAVENOUS | Status: AC | PRN
  Administered 2022-03-21: 100 mL via INTRAVENOUS

## 2022-03-21 NOTE — Progress Notes (Signed)
Pt came in to leave urine for Urine Culture, due to [facility] Rep losing specimen.

## 2022-03-22 ENCOUNTER — Ambulatory Visit (HOSPITAL_BASED_OUTPATIENT_CLINIC_OR_DEPARTMENT_OTHER): Payer: Commercial Managed Care - HMO | Admitting: Physical Therapy

## 2022-03-23 LAB — URINE CULTURE: Organism ID, Bacteria: NO GROWTH

## 2022-03-29 ENCOUNTER — Encounter (HOSPITAL_BASED_OUTPATIENT_CLINIC_OR_DEPARTMENT_OTHER): Payer: Self-pay | Admitting: Physical Therapy

## 2022-03-29 ENCOUNTER — Ambulatory Visit (HOSPITAL_BASED_OUTPATIENT_CLINIC_OR_DEPARTMENT_OTHER): Payer: Commercial Managed Care - HMO | Admitting: Physical Therapy

## 2022-03-29 DIAGNOSIS — M542 Cervicalgia: Secondary | ICD-10-CM

## 2022-03-29 DIAGNOSIS — M5459 Other low back pain: Secondary | ICD-10-CM

## 2022-03-29 DIAGNOSIS — M6281 Muscle weakness (generalized): Secondary | ICD-10-CM

## 2022-03-29 DIAGNOSIS — R293 Abnormal posture: Secondary | ICD-10-CM

## 2022-03-29 NOTE — Therapy (Signed)
[name]  OUTPATIENT PHYSICAL THERAPY THORACOLUMBAR TREATMENT   Patient Name: [name] MRN: [id number] DOB:01/04/1971, 51 y.o., female Today's Date: 03/29/2022   PT End of Session - 03/29/22 1407     Visit Number 17    Number of Visits 20    Date for PT Re-Evaluation 04/10/22    Authorization Type [employer]    PT Start Time 1405    PT Stop Time 1445    PT Time Calculation (min) 40 min    Activity Tolerance Patient tolerated treatment well    Behavior During Therapy [facility] for tasks assessed/performed                        Past Medical History:  Diagnosis Date   Abdominal pain, epigastric 11/20/2019   Asthma    as a child   Cervical dysplasia    Gastroesophageal reflux disease 11/20/2019   Genetic testing 02/01/2018   Genetic testing was performed in 2015. The [employer] OvaNext panel was ordered. In total, 23 genes were analyzed as part of this panel: ATM, BARD1, BRCA1, BRCA2, BRIP1, CDH1, CHEK2, EPCAM, MLH1, MRE11A, MSH2, MSH6, MUTYH, NBN, NF1, PALB2, PMS2, PTEN, RAD50, RAD51C, RAD51D, STK11, and TP53. No pathogenic variants were detected. Genetic testing did detect a Variant of Unknown Significance (VUS) at the t   GERD (gastroesophageal reflux disease)    had during chemo   Headache 06/12/2013   History of chemotherapy    History of kidney stones    History of radiation therapy 11/24/13- 01/08/14   reconstructed right breast/chest wall 4500 cGy 25 sessions, electron beam boost 1440 cGy 8 sessions   Liver masses 12/06/2016   Major depressive disorder, in remission 03/12/2017   Malignant neoplasm of lower-inner quadrant of right breast of female, estrogen receptor positive 2012   Pneumonia    PONV (postoperative nausea and vomiting)    Recurrent cancer of right breast 12/22/2013   Thrombocytopenia 05/22/2013   Past Surgical History:  Procedure Laterality Date   ADENOIDECTOMY     BREAST LUMPECTOMY WITH NEEDLE LOCALIZATION Right 05/05/2013   Procedure: EXCISION RECURRENT  CANCER RIGHT BREAST WITH NEEDLE LOCALOZATION;  Surgeon: Adin Hector, MD;  Location: [place];  Service: General;  Laterality: Right;   BREAST RECONSTRUCTION  06/29/2011   Procedure: BREAST RECONSTRUCTION;  Surgeon: Theodoro Kos, DO;  Location: [place];  Service: Plastics;  Laterality: Bilateral;  Immediate Bilateral Breast Reconstruction with Bilateral Tissue Expanders and placement of  Alloderm    DILATION AND CURETTAGE OF UTERUS     fibroid tumor surgery     lazy eye     corrective surgery   MASTECTOMY W/ SENTINEL NODE BIOPSY  06/29/2011   Procedure: MASTECTOMY WITH SENTINEL LYMPH NODE BIOPSY;  Surgeon: Adin Hector, MD;  Location: [place];  Service: General;  Laterality: Right;  right skin spairing mstectomy and right sentinel lymph node biopsy   PORT-A-CATH REMOVAL  03/12/2012   Procedure: MINOR REMOVAL PORT-A-CATH;  Surgeon: Adin Hector, MD;  Location: [place];  Service: General;  Laterality: N/A;  Upper left   PORTACATH PLACEMENT Left 05/05/2013   Procedure: INSERTION PORT-A-CATH;  Surgeon: Adin Hector, MD;  Location: [place];  Service: General;  Laterality: Left;   RHINOPLASTY     uterine cervical treatments  2002   for precancerous lesions   Patient Active Problem [name]   Diagnosis Date Noted   Snoring 07/25/2021   Excessive daytime sleepiness 07/25/2021   Routine  general medical examination at a health care facility 07/08/2021   Positive ANA (antinuclear antibody) 03/18/2021   Left leg pain 03/18/2021   Arthralgia 03/11/2021   Back pain 02/26/2021   Neck pain 02/26/2021   Gastroesophageal reflux disease 11/20/2019   Major depressive disorder, in remission 03/12/2017   Liver masses 12/06/2016   Recurrent cancer of right breast 12/22/2013   Headache 06/12/2013   Thrombocytopenia 05/22/2013   Malignant neoplasm of lower-inner quadrant of right breast of female, estrogen receptor positive 2012     REFERRING PROVIDER: Gardenia Phlegm,  NP  REFERRING DIAG:  C50.311,Z17.0 (ICD-10-CM) - Malignant neoplasm of lower-inner quadrant of right breast of female, estrogen receptor positive ([place])  M54.50,G89.29 (ICD-10-CM) - Chronic midline low back pain without sciatica    Rationale for Evaluation and Treatment Rehabilitation  THERAPY DIAG:  Cervicalgia  Muscle weakness (generalized)  Abnormal posture  Other low back pain  ONSET DATE: chronic  SUBJECTIVE:                                                                                                                                                                                           SUBJECTIVE STATEMENT:  I do have a kidney stone on the left side. Was looking through MRI and considering what it may mean.    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 Yes: NPRS scale: 5/10 Pain location: low back, neck Pain description: sore Aggravating factors: constant pain Relieving factors: excedrin tension   PRECAUTIONS: None  WEIGHT BEARING RESTRICTIONS No  FALLS:  Has patient fallen in last 6 months? No  PLOF: Independent  PATIENT GOALS decrease pain   OBJECTIVE:   DIAGNOSTIC FINDINGS:  Oct 2022 MRI- hemangioma at L4, fatty infiltration to sacrum.   Cervical AROM 10/12  flexion WFL  extension WFL p!   L rotation WFL  R rotation WFL  L flexion WFL  R  flexion WFL   (Blank rows = not tested)  UE MMT: 4/5 throughout; pain into mid thoracic area with resisted flexion and ABD  Light touch sensation throughout C4-C7 WFL   LUMBAR ROM:    WFL   LOWER EXTREMITY ROM:      WFL   LOWER EXTREMITY MMT:     MMT Right 10/12 Left 10/12  Hip flexion  4+/5  4+/5  Hip extension      Hip abduction   4+/5  4+/5  Hip adduction  4+/5  4+/5  Hip internal rotation    4+/5  4+/5  Hip external rotation   4+/5  4+/5   (Blank rows = not tested)   TODAY'S TREATMENT    Treatment                             03/29/22:  Qped hip ext with ball behind knee Kettle bell squats to tap table   03/15/22  TherEx  PPT with LE extensions x10 B DKTC stretch for lumbar region 5x10 seconds Lumbar rotation stretch 5x5 seconds B Checked hip hinges- did ok but will need more work here needed has poor biomechanics  Manual  Self trigger point release using tennis ball to lumbar spine (supine) Tennis ball MFR B lumbar spine in prone    Treatment                            03/01/22:  Cable chop 2x10 10lbs (trunk and hips turn together) Cable paloff 15lbs 3x5 Unilateral seated cable row 3x8 20lbs (allow for slight T/S flex and ext today) Side plank with hip ABD 3s 10x   Treatment                            02/15/22:  Core contraction Wall bridge + UE flexion/ext Legs extended heels on wall- upper crucnh reach to ceiling, leg lift to 90 hip flexion, +pelvic lift Side plank knee/elbow with UE horiz abd/add Bird dog knee to elbow Primal push up     PATIENT EDUCATION:  Education details: Geophysicist/field seismologist of condition, POC, HEP, exercise form/rationale Person educated: Patient Education method: Explanation, Demonstration, Tactile cues, Verbal cues, and Handouts Education comprehension: verbalized understanding, returned demonstration, verbal cues required, tactile cues required, and needs further education   HOME EXERCISE PROGRAM: Access Code: [email or web address] URL: [email or web address]   ASSESSMENT:  CLINICAL IMPRESSION:  Significant amount of appt time today spent on education regarding imaging, POC and symptom awareness/management. Added strengthening exercises to target large power muscles- HS and quads. Will take a picture of her home gym setup to build HEP.   OBJECTIVE IMPAIRMENTS decreased activity tolerance, difficulty walking, increased muscle spasms, postural dysfunction, and pain.   ACTIVITY LIMITATIONS carrying, lifting, bending, sitting, standing, squatting, sleeping,  stairs, and locomotion level  PARTICIPATION LIMITATIONS: meal prep, cleaning, laundry, driving, shopping, and community activity  PERSONAL FACTORS 1-2 comorbidities: benign growth found in lumbar spine, chronic pain, known LLD  are also affecting patient's functional outcome.      GOALS:  SHORT TERM GOALS: Target date:12/06/21  Will determine need for heel lift Baseline: Goal status: achieved   LONG TERM GOALS: Target date: 04/10/2022   Bil hip abd strength to meet age appropriate levels per hand held dynamometry Baseline:  Goal status: Initial  2.  Able to demonstrate proper form in long term core strength Baseline:  Goal status: INITIAL  3.  Pt will be able to tolerate sitting or standing for 30 minute periods without increase in pain Baseline:  Goal status: partially met.  4. Pt will be able to demonstrate/report ability to sit/stand/sleep for extended periods of time without pain in order to demonstrate functional improvement and tolerance to static positioning.  Goal Status: Initial  5. Pt will be able to lift/squat/hold >20 lbs in order to demonstrate functional improvement in lumbopelvic and UE strength for return to ADL/household duties.  Goal Status: initial       PLAN: PT FREQUENCY: 1-2x/week  PT DURATION: 8 weeks  PLANNED INTERVENTIONS: Therapeutic exercises, Therapeutic activity, Neuromuscular re-education, Balance training, Gait training, Patient/Family education, Self Care, Joint mobilization, Stair training, Aquatic Therapy, Spinal mobilization, Cryotherapy, Moist heat, Taping, Manual therapy, and Re-evaluation.  PLAN FOR NEXT SESSION: TPDN PRN, continue with core stability training, continue to practice hip hinges, increase resistance load;    [place] C. Dontavis Tschantz PT, DPT 03/29/22 3:13 PM

## 2022-04-06 ENCOUNTER — Encounter (HOSPITAL_BASED_OUTPATIENT_CLINIC_OR_DEPARTMENT_OTHER): Payer: Self-pay | Admitting: Physical Therapy

## 2022-04-06 ENCOUNTER — Ambulatory Visit (HOSPITAL_BASED_OUTPATIENT_CLINIC_OR_DEPARTMENT_OTHER): Payer: Commercial Managed Care - HMO | Admitting: Physical Therapy

## 2022-04-06 DIAGNOSIS — M542 Cervicalgia: Secondary | ICD-10-CM

## 2022-04-06 DIAGNOSIS — M6281 Muscle weakness (generalized): Secondary | ICD-10-CM

## 2022-04-06 NOTE — Addendum Note (Signed)
Addended by: Jonna Coup on: 04/06/2022 05:29 PM   Modules accepted: Orders

## 2022-04-06 NOTE — Therapy (Addendum)
[name]  OUTPATIENT PHYSICAL THERAPY THORACOLUMBAR TREATMENT   Patient Name: [name] MRN: [id number] DOB:16-Oct-1970, 51 y.o., female Today's Date: 04/06/2022   PT End of Session - 04/06/22 1607     Visit Number 18    Number of Visits 20    Date for PT Re-Evaluation 05/12/22    Authorization Type [employer]    PT Start Time 1608    PT Stop Time 1648    PT Time Calculation (min) 40 min    Activity Tolerance Patient tolerated treatment well    Behavior During Therapy [facility] for tasks assessed/performed                        Past Medical History:  Diagnosis Date   Abdominal pain, epigastric 11/20/2019   Asthma    as a child   Cervical dysplasia    Gastroesophageal reflux disease 11/20/2019   Genetic testing 02/01/2018   Genetic testing was performed in 2015. The [employer] O[employer] panel was ordered. In total, 23 genes were analyzed as part of this panel: ATM, BARD1, BRCA1, BRCA2, BRIP1, CDH1, CHEK2, EPCAM, MLH1, MRE11A, MSH2, MSH6, MUTYH, NBN, NF1, PALB2, PMS2, PTEN, RAD50, RAD51C, RAD51D, STK11, and TP53. No pathogenic variants were detected. Genetic testing did detect a Variant of Unknown Significance (VUS) at the t   GERD (gastroesophageal reflux disease)    had during chemo   Headache 06/12/2013   History of chemotherapy    History of kidney stones    History of radiation therapy 11/24/13- 01/08/14   reconstructed right breast/chest wall 4500 cGy 25 sessions, electron beam boost 1440 cGy 8 sessions   Liver masses 12/06/2016   Major depressive disorder, in remission 03/12/2017   Malignant neoplasm of lower-inner quadrant of right breast of female, estrogen receptor positive 2012   Pneumonia    PONV (postoperative nausea and vomiting)    Recurrent cancer of right breast 12/22/2013   Thrombocytopenia 05/22/2013   Past Surgical History:  Procedure Laterality Date   ADENOIDECTOMY     BREAST LUMPECTOMY WITH NEEDLE LOCALIZATION Right 05/05/2013   Procedure: EXCISION RECURRENT  CANCER RIGHT BREAST WITH NEEDLE LOCALOZATION;  Surgeon: Adin Hector, MD;  Location: [place];  Service: General;  Laterality: Right;   BREAST RECONSTRUCTION  06/29/2011   Procedure: BREAST RECONSTRUCTION;  Surgeon: Theodoro Kos, DO;  Location: [place];  Service: Plastics;  Laterality: Bilateral;  Immediate Bilateral Breast Reconstruction with Bilateral Tissue Expanders and placement of  Alloderm    DILATION AND CURETTAGE OF UTERUS     fibroid tumor surgery     lazy eye     corrective surgery   MASTECTOMY W/ SENTINEL NODE BIOPSY  06/29/2011   Procedure: MASTECTOMY WITH SENTINEL LYMPH NODE BIOPSY;  Surgeon: Adin Hector, MD;  Location: [place];  Service: General;  Laterality: Right;  right skin spairing mstectomy and right sentinel lymph node biopsy   PORT-A-CATH REMOVAL  03/12/2012   Procedure: MINOR REMOVAL PORT-A-CATH;  Surgeon: Adin Hector, MD;  Location: [place];  Service: General;  Laterality: N/A;  Upper left   PORTACATH PLACEMENT Left 05/05/2013   Procedure: INSERTION PORT-A-CATH;  Surgeon: Adin Hector, MD;  Location: [place];  Service: General;  Laterality: Left;   RHINOPLASTY     uterine cervical treatments  2002   for precancerous lesions   Patient Active Problem List   Diagnosis Date Noted   Snoring 07/25/2021   Excessive daytime sleepiness 07/25/2021   Routine  general medical examination at a health care facility 07/08/2021   Positive ANA (antinuclear antibody) 03/18/2021   Left leg pain 03/18/2021   Arthralgia 03/11/2021   Back pain 02/26/2021   Neck pain 02/26/2021   Gastroesophageal reflux disease 11/20/2019   Major depressive disorder, in remission 03/12/2017   Liver masses 12/06/2016   Recurrent cancer of right breast 12/22/2013   Headache 06/12/2013   Thrombocytopenia 05/22/2013   Malignant neoplasm of lower-inner quadrant of right breast of female, estrogen receptor positive 2012     REFERRING PROVIDER: Gardenia Phlegm,  NP  REFERRING DIAG:  C50.311,Z17.0 (ICD-10-CM) - Malignant neoplasm of lower-inner quadrant of right breast of female, estrogen receptor positive ([place])  M54.50,G89.29 (ICD-10-CM) - Chronic midline low back pain without sciatica    Rationale for Evaluation and Treatment Rehabilitation  THERAPY DIAG:  Cervicalgia  Muscle weakness (generalized)  ONSET DATE: chronic  SUBJECTIVE:                                                                                                                                                                                           SUBJECTIVE STATEMENT:  Back pain is still there.   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 Yes: NPRS scale: 5/10 Pain location: low back, neck Pain description: sore Aggravating factors: constant pain Relieving factors: excedrin tension   PRECAUTIONS: None  WEIGHT BEARING RESTRICTIONS No  FALLS:  Has patient fallen in last 6 months? No  PLOF: Independent  PATIENT GOALS decrease pain   OBJECTIVE:   DIAGNOSTIC FINDINGS:  Oct 2022 MRI- hemangioma at L4, fatty infiltration to sacrum.   Cervical AROM 10/12  flexion WFL  extension WFL p!   L rotation WFL  R rotation WFL  L flexion WFL  R  flexion WFL   (Blank rows = not tested)  UE MMT: 4/5 throughout; pain into mid thoracic area with resisted flexion and ABD  Light touch sensation throughout C4-C7 WFL   LUMBAR ROM:    WFL   LOWER EXTREMITY ROM:      WFL   LOWER EXTREMITY MMT:     MMT Right 10/12 Left 10/12  Hip flexion  4+/5  4+/5  Hip extension      Hip abduction   4+/5  4+/5  Hip adduction  4+/5  4+/5  Hip internal rotation    4+/5  4+/5  Hip external rotation   4+/5    4+/5   (Blank rows = not tested)   TODAY'S TREATMENT    Treatment  04/06/22:  HEP quick review of each exercise   Treatment                            03/29/22:  Qped  hip ext with ball behind knee Kettle bell squats to tap table   03/15/22  TherEx  PPT with LE extensions x10 B DKTC stretch for lumbar region 5x10 seconds Lumbar rotation stretch 5x5 seconds B Checked hip hinges- did ok but will need more work here needed has poor biomechanics  Manual  Self trigger point release using tennis ball to lumbar spine (supine) Tennis ball MFR B lumbar spine in prone    Treatment                            03/01/22:  Cable chop 2x10 10lbs (trunk and hips turn together) Cable paloff 15lbs 3x5 Unilateral seated cable row 3x8 20lbs (allow for slight T/S flex and ext today) Side plank with hip ABD 3s 10x   PATIENT EDUCATION:  Education details: Anatomy of condition, POC, HEP, exercise form/rationale Person educated: Patient Education method: Explanation, Demonstration, Tactile cues, Verbal cues, and Handouts Education comprehension: verbalized understanding, returned demonstration, verbal cues required, tactile cues required, and needs further education   HOME EXERCISE PROGRAM: Access Code: [email or web address] URL: [email or web address]   ASSESSMENT:  CLINICAL IMPRESSION:  Patient notes that she has not been consistent with her home exercise program and found that back pain increased with missing a couple of PT appointments.  We discussed the importance of regular exercise and created an HEP separating Upper, lower and core- encouraged her to do a lower body or upper workout each day and to choose 3 core exercises. ExtendingPOC to 1/week through the end of Jan to finalize long term strengthening program.    OBJECTIVE IMPAIRMENTS decreased activity tolerance, difficulty walking, increased muscle spasms, postural dysfunction, and pain.   ACTIVITY LIMITATIONS carrying, lifting, bending, sitting, standing, squatting, sleeping, stairs, and locomotion level  PARTICIPATION LIMITATIONS: meal prep, cleaning, laundry, driving, shopping, and community  activity  PERSONAL FACTORS 1-2 comorbidities: benign growth found in lumbar spine, chronic pain, known LLD  are also affecting patient's functional outcome.      GOALS:  SHORT TERM GOALS: Target date:12/06/21  Will determine need for heel lift Baseline: Goal status: achieved   LONG TERM GOALS: Target date: 04/10/2022   Bil hip abd strength to meet age appropriate levels per hand held dynamometry Baseline:  Goal status: Initial  2.  Able to demonstrate proper form in long term core strength Baseline:  Goal status: INITIAL  3.  Pt will be able to tolerate sitting or standing for 30 minute periods without increase in pain Baseline:  Goal status: partially met.  4. Pt will be able to demonstrate/report ability to sit/stand/sleep for extended periods of time without pain in order to demonstrate functional improvement and tolerance to static positioning.  Goal Status: Initial  5. Pt will be able to lift/squat/hold >20 lbs in order to demonstrate functional improvement in lumbopelvic and UE strength for return to ADL/household duties.  Goal Status: initial       PLAN: PT FREQUENCY: 1-2x/week  PT DURATION: 8 weeks  PLANNED INTERVENTIONS: Therapeutic exercises, Therapeutic activity, Neuromuscular re-education, Balance training, Gait training, Patient/Family education, Self Care, Joint mobilization, Stair training, Aquatic Therapy, Spinal mobilization, Cryotherapy, Moist heat, Taping, Manual therapy, and Re-evaluation.  PLAN FOR NEXT SESSION: continue HEP review, adjust  PRN   Toshia Larkin C. Dustyn Dansereau PT, DPT 04/06/22 5:23 PM

## 2022-04-11 ENCOUNTER — Other Ambulatory Visit: Payer: Self-pay

## 2022-04-11 DIAGNOSIS — R194 Change in bowel habit: Secondary | ICD-10-CM

## 2022-04-11 DIAGNOSIS — G8929 Other chronic pain: Secondary | ICD-10-CM

## 2022-04-11 MED ORDER — LINACLOTIDE 145 MCG PO CAPS: 145.0000 ug | ORAL_CAPSULE | Freq: Every day | ORAL | 3 refills | Status: DC

## 2022-04-12 ENCOUNTER — Ambulatory Visit (HOSPITAL_BASED_OUTPATIENT_CLINIC_OR_DEPARTMENT_OTHER): Payer: Commercial Managed Care - HMO | Attending: Orthopaedic Surgery | Admitting: Physical Therapy

## 2022-04-12 ENCOUNTER — Encounter (HOSPITAL_BASED_OUTPATIENT_CLINIC_OR_DEPARTMENT_OTHER): Payer: Self-pay | Admitting: Physical Therapy

## 2022-04-12 DIAGNOSIS — M542 Cervicalgia: Secondary | ICD-10-CM

## 2022-04-12 DIAGNOSIS — M6281 Muscle weakness (generalized): Secondary | ICD-10-CM

## 2022-04-12 DIAGNOSIS — M5459 Other low back pain: Secondary | ICD-10-CM | POA: Diagnosis present

## 2022-04-12 DIAGNOSIS — R293 Abnormal posture: Secondary | ICD-10-CM

## 2022-04-12 NOTE — Therapy (Signed)
OUTPATIENT PHYSICAL THERAPY THORACOLUMBAR TREATMENT   Patient Name: [name] MRN: [id number] DOB:[birth year], 52 y.o., female Today's Date: 04/12/2022   PT End of Session - 04/12/22 1319     Visit Number 19    Number of Visits 20    Date for PT Re-Evaluation 05/12/22    Authorization Type [employer]    PT Start Time 1318    PT Stop Time 1400    PT Time Calculation (min) 42 min    Activity Tolerance Patient tolerated treatment well    Behavior During Therapy [facility] for tasks assessed/performed                        Past Medical History:  Diagnosis Date   Abdominal pain, epigastric 11/20/2019   Asthma    as a child   Cervical dysplasia    Gastroesophageal reflux disease 11/20/2019   Genetic testing 02/01/2018   Genetic testing was performed in 2015. The [employer] panel was ordered. In total, 23 genes were analyzed as part of this panel: ATM, BARD1, BRCA1, BRCA2, BRIP1, CDH1, CHEK2, EPCAM, MLH1, MRE11A, MSH2, MSH6, MUTYH, NBN, NF1, PALB2, PMS2, PTEN, RAD50, RAD51C, RAD51D, STK11, and TP53. No pathogenic variants were detected. Genetic testing did detect a Variant of Unknown Significance (VUS) at the t   GERD (gastroesophageal reflux disease)    had during chemo   Headache 06/12/2013   History of chemotherapy    History of kidney stones    History of radiation therapy 11/24/13- 01/08/14   reconstructed right breast/chest wall 4500 cGy 25 sessions, electron beam boost 1440 cGy 8 sessions   Liver masses 12/06/2016   Major depressive disorder, in remission 03/12/2017   Malignant neoplasm of lower-inner quadrant of right breast of female, estrogen receptor positive 2012   Pneumonia    PONV (postoperative nausea and vomiting)    Recurrent cancer of right breast 12/22/2013   Thrombocytopenia 05/22/2013   Past Surgical History:  Procedure Laterality Date   ADENOIDECTOMY     BREAST LUMPECTOMY WITH NEEDLE LOCALIZATION Right 05/05/2013   Procedure: EXCISION RECURRENT  CANCER RIGHT BREAST WITH NEEDLE LOCALOZATION;  Surgeon: Adin Hector, MD;  Location: [place];  Service: General;  Laterality: Right;   BREAST RECONSTRUCTION  06/29/2011   Procedure: BREAST RECONSTRUCTION;  Surgeon: Theodoro Kos, DO;  Location: [place];  Service: Plastics;  Laterality: Bilateral;  Immediate Bilateral Breast Reconstruction with Bilateral Tissue Expanders and placement of  Alloderm    DILATION AND CURETTAGE OF UTERUS     fibroid tumor surgery     lazy eye     corrective surgery   MASTECTOMY W/ SENTINEL NODE BIOPSY  06/29/2011   Procedure: MASTECTOMY WITH SENTINEL LYMPH NODE BIOPSY;  Surgeon: Adin Hector, MD;  Location: [place];  Service: General;  Laterality: Right;  right skin spairing mstectomy and right sentinel lymph node biopsy   PORT-A-CATH REMOVAL  03/12/2012   Procedure: MINOR REMOVAL PORT-A-CATH;  Surgeon: Adin Hector, MD;  Location: [place];  Service: General;  Laterality: N/A;  Upper left   PORTACATH PLACEMENT Left 05/05/2013   Procedure: INSERTION PORT-A-CATH;  Surgeon: Adin Hector, MD;  Location: [place];  Service: General;  Laterality: Left;   RHINOPLASTY     uterine cervical treatments  2002   for precancerous lesions   Patient Active Problem List   Diagnosis Date Noted   Snoring 07/25/2021   Excessive daytime sleepiness 07/25/2021   Routine general  medical examination at a health care facility 07/08/2021   Positive ANA (antinuclear antibody) 03/18/2021   Left leg pain 03/18/2021   Arthralgia 03/11/2021   Back pain 02/26/2021   Neck pain 02/26/2021   Gastroesophageal reflux disease 11/20/2019   Major depressive disorder, in remission 03/12/2017   Liver masses 12/06/2016   Recurrent cancer of right breast 12/22/2013   Headache 06/12/2013   Thrombocytopenia 05/22/2013   Malignant neoplasm of lower-inner quadrant of right breast of female, estrogen receptor positive 2012     REFERRING PROVIDER: Gardenia Phlegm,  NP  REFERRING DIAG:  C50.311,Z17.0 (ICD-10-CM) - Malignant neoplasm of lower-inner quadrant of right breast of female, estrogen receptor positive ([place])  M54.50,G89.29 (ICD-10-CM) - Chronic midline low back pain without sciatica    Rationale for Evaluation and Treatment Rehabilitation  THERAPY DIAG:  Cervicalgia  Muscle weakness (generalized)  Abnormal posture  ONSET DATE: chronic  SUBJECTIVE:                                                                                                                                                                                           SUBJECTIVE STATEMENT:  Some burning in my back when I am trying to sit. I notice my ankles swelling.   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 Yes: NPRS scale: 5/10 Pain location: low back, neck Pain description: sore Aggravating factors: constant pain Relieving factors: excedrin tension   PRECAUTIONS: None  WEIGHT BEARING RESTRICTIONS No  FALLS:  Has patient fallen in last 6 months? No  PLOF: Independent  PATIENT GOALS decrease pain   OBJECTIVE:   DIAGNOSTIC FINDINGS:  Oct 2022 MRI- hemangioma at L4, fatty infiltration to sacrum.   Cervical AROM 10/12  flexion WFL  extension WFL p!   L rotation WFL  R rotation WFL  L flexion WFL  R  flexion WFL   (Blank rows = not tested)  UE MMT: 4/5 throughout; pain into mid thoracic area with resisted flexion and ABD  Light touch sensation throughout C4-C7 WFL   LUMBAR ROM:    WFL   LOWER EXTREMITY ROM:      WFL   LOWER EXTREMITY MMT:     MMT Right 10/12 Left 10/12  Hip flexion  4+/5  4+/5  Hip extension      Hip abduction   4+/5  4+/5  Hip adduction  4+/5  4+/5  Hip internal rotation    4+/5  4+/5  Hip external rotation   4+/5    4+/5   (Blank rows = not tested)   TODAY'S  TREATMENT    Treatment                            04/12/22:  Squat to table tap 10lb  kettle bell Sumo squats Lunges Heel raises- neutral & ER Wall squats: wide row, UE scissors and horiz add reach across Plank squat back Child pose- mid & lateral Figure 4 stretch Supine hamstring stretch with strap   Treatment                            04/06/22:  HEP quick review of each exercise   Treatment                            03/29/22:  Qped hip ext with ball behind knee Kettle bell squats to tap table   03/15/22  TherEx  PPT with LE extensions x10 B DKTC stretch for lumbar region 5x10 seconds Lumbar rotation stretch 5x5 seconds B Checked hip hinges- did ok but will need more work here needed has poor biomechanics  Manual  Self trigger point release using tennis ball to lumbar spine (supine) Tennis ball MFR B lumbar spine in prone    PATIENT EDUCATION:  Education details: Geophysicist/field seismologist of condition, POC, HEP, exercise form/rationale Person educated: Patient Education method: Explanation, Demonstration, Tactile cues, Verbal cues, and Handouts Education comprehension: verbalized understanding, returned demonstration, verbal cues required, tactile cues required, and needs further education   HOME EXERCISE PROGRAM: Access Code: [email or web address] URL: [email or web address]   ASSESSMENT:  CLINICAL IMPRESSION:  Burning and fatigue noted in lumbar musculature that improved with stretching which was added to HEP today. 20 min without interruption of exercises focusing on lower body large muscles- first free standing and then to wall for stability with fatigue.    OBJECTIVE IMPAIRMENTS decreased activity tolerance, difficulty walking, increased muscle spasms, postural dysfunction, and pain.   ACTIVITY LIMITATIONS carrying, lifting, bending, sitting, standing, squatting, sleeping, stairs, and locomotion level  PARTICIPATION LIMITATIONS: meal prep, cleaning, laundry, driving, shopping, and community activity  PERSONAL FACTORS 1-2 comorbidities: benign growth  found in lumbar spine, chronic pain, known LLD  are also affecting patient's functional outcome.      GOALS:  SHORT TERM GOALS: Target date:12/06/21  Will determine need for heel lift Baseline: Goal status: achieved   LONG TERM GOALS: Target date: 04/10/2022   Bil hip abd strength to meet age appropriate levels per hand held dynamometry Baseline:  Goal status: Initial  2.  Able to demonstrate proper form in long term core strength Baseline:  Goal status: INITIAL  3.  Pt will be able to tolerate sitting or standing for 30 minute periods without increase in pain Baseline:  Goal status: partially met.  4. Pt will be able to demonstrate/report ability to sit/stand/sleep for extended periods of time without pain in order to demonstrate functional improvement and tolerance to static positioning.  Goal Status: Initial  5. Pt will be able to lift/squat/hold >20 lbs in order to demonstrate functional improvement in lumbopelvic and UE strength for return to ADL/household duties.  Goal Status: initial       PLAN: PT FREQUENCY: 1-2x/week  PT DURATION: 8 weeks  PLANNED INTERVENTIONS: Therapeutic exercises, Therapeutic activity, Neuromuscular re-education, Balance training, Gait training, Patient/Family education, Self Care, Joint mobilization, Stair training, Aquatic Therapy, Spinal mobilization, Cryotherapy, Moist heat, Taping, Manual therapy, and Re-evaluation.  PLAN FOR NEXT  SESSION: continue HEP review, adjust PRN   [name] PT, DPT 04/12/22 2:25 PM

## 2022-04-19 ENCOUNTER — Ambulatory Visit (HOSPITAL_BASED_OUTPATIENT_CLINIC_OR_DEPARTMENT_OTHER): Payer: Commercial Managed Care - HMO | Admitting: Physical Therapy

## 2022-04-24 NOTE — Progress Notes (Signed)
Office Visit Note  Patient: [name]             Date of Birth: [birth year]           MRN: [id number]             PCP: Hoyt Koch, MD Referring: Hoyt Koch, * Visit Date: 04/25/2022   Subjective:  New Patient (Initial Visit) (Abnormal labs)   History of Present Illness: [name] is a 52 y.o. female here for evaluation of positive ANA checked in association with ongoing symptoms including joint pain and fatigue most predominantly.  She has had some chronic issues with back pain and multiple levels attributed to some degenerative joint disease in the spine that is more longstanding.  However for about the past 1 year she has increase in symptoms and also experiencing some more pain in other areas her extremities and in bilateral hips.  There is more tenderness to pressure in the distal legs on both sides below the level of the knee.  She notices some possible swelling involving lower extremities but none localized to specific joint areas.  She does not notice any particular erythema or warmth in affected areas.  She tends to notice worsening after period of increased activity or exertion taking 1 or 2 days to recover from fatigue and aches.  She was taking Excedrin which was partially helpful for symptoms but stopped due to irritation with upper abdominal discomfort. Outside of joint problems she experiences persistent fatigue with a feeling of very low energy level.  She does not notice any particular sleep disruption and is not sleepy or taking naps during the daytime.  She has chronic gastrointestinal symptoms with pretty severe constipation predominant irritable bowel symptoms.  She also has some history of GERD without known upper GI complication.  She has had multiple liver masses present on CT imaging but with no high risk features identified and negative for increased metabolic activity on PET scan.  She reports generalized alopecia with some hair  thinning or breaking without any focal bald spots or rash or scarring on the scalp.  Skin sensitivity to light with occasional erythema but no persistent rashes.  She has some chronic dryness affecting eyes and mouth no history of oral or nasal ulcers.  No persistent lymphadenopathy.  She denies typical Raynaud's symptoms with no fingernail changes.  Breast cancer Hx [name] is a 52 year old woman with h/o ER positive breast cancer diagnosed in 2012 s/p bilateral mastectomies, chemotherapy, and tamoxifen. She developed recurrence in 2015, stage IA triple positive breast cancer s/p TDM1, adjuvant radiation, and unable to tolerate long term antiestrogen therapy.   Labs reviewed 02/2021 ANA 1:40 homogenous dsDNA, Sm, SSA, SSB, Scl-70 Abs neg RF neg  Imaging reviewed 01/20/21 MRI Lumbar spine Disc levels: T12-L1: Negative. L1-2: Negative. L2-3: Negative. L3-4: A rightward disc protrusion is present. Mild facet hypertrophy is noted bilaterally. Mild right foraminal narrowing is new. The central canal and left foramen are patent. L4-5: A broad-based disc protrusion is asymmetric to the right. Mild facet hypertrophy and ligamentum flavum thickening are noted. Moderate right subarticular narrowing is present. Progressive moderate foraminal narrowing is noted bilaterally, right greater than left. L5-S1: Mild facet hypertrophy is noted bilaterally. No significant disc protrusion or stenosis is present.  03/21/21 Xray left knee Trace patellofemoral spurring. Otherwise unremarkable radiographic appearance.  Activities of Daily Living:  Patient reports morning stiffness for 2 hours.   Patient Reports nocturnal pain.  Difficulty  dressing/grooming: Denies Difficulty climbing stairs: Denies Difficulty getting out of chair: Denies Difficulty using hands for taps, buttons, cutlery, and/or writing: Reports  Review of Systems  Constitutional:  Positive for fatigue.  HENT:  Positive for mouth dryness.  Negative for mouth sores.   Eyes:  Positive for dryness.  Respiratory:  Positive for shortness of breath.   Cardiovascular:  Positive for palpitations. Negative for chest pain.  Gastrointestinal:  Positive for constipation. Negative for blood in stool and diarrhea.  Endocrine: Positive for increased urination.  Genitourinary:  Negative for involuntary urination.  Musculoskeletal:  Positive for joint pain, joint pain, joint swelling, myalgias, muscle weakness, morning stiffness, muscle tenderness and myalgias. Negative for gait problem.  Skin:  Positive for rash, hair loss, nodules/bumps and sensitivity to sunlight. Negative for color change.  Allergic/Immunologic: Negative for susceptible to infections.  Neurological:  Positive for dizziness and headaches.  Hematological:  Negative for swollen glands.  Psychiatric/Behavioral:  Positive for sleep disturbance. Negative for depressed mood. The patient is nervous/anxious.     PMFS History:  Patient Active Problem List   Diagnosis Date Noted   Abnormal laboratory test result 04/25/2022   Snoring 07/25/2021   Excessive daytime sleepiness 07/25/2021   Routine general medical examination at a health care facility 07/08/2021   Positive ANA (antinuclear antibody) 03/18/2021   Left leg pain 03/18/2021   Arthralgia 03/11/2021   Back pain 02/26/2021   Neck pain 02/26/2021   Gastroesophageal reflux disease 11/20/2019   Major depressive disorder, in remission 03/12/2017   Liver masses 12/06/2016   Recurrent cancer of right breast 12/22/2013   Headache 06/12/2013   Thrombocytopenia 05/22/2013   Malignant neoplasm of lower-inner quadrant of right breast of female, estrogen receptor positive 2012    Past Medical History:  Diagnosis Date   Abdominal pain, epigastric 11/20/2019   Asthma    as a child   Cervical dysplasia    Gastroesophageal reflux disease 11/20/2019   Genetic testing 02/01/2018   Genetic testing was performed in 2015. The [employer]  OvaNext panel was ordered. In total, 23 genes were analyzed as part of this panel: ATM, BARD1, BRCA1, BRCA2, BRIP1, CDH1, CHEK2, EPCAM, MLH1, MRE11A, MSH2, MSH6, MUTYH, NBN, NF1, PALB2, PMS2, PTEN, RAD50, RAD51C, RAD51D, STK11, and TP53. No pathogenic variants were detected. Genetic testing did detect a Variant of Unknown Significance (VUS) at the t   GERD (gastroesophageal reflux disease)    had during chemo   Headache 06/12/2013   History of chemotherapy    History of kidney stones    History of radiation therapy 11/24/13- 01/08/14   reconstructed right breast/chest wall 4500 cGy 25 sessions, electron beam boost 1440 cGy 8 sessions   Liver masses 12/06/2016   Major depressive disorder, in remission 03/12/2017   Malignant neoplasm of lower-inner quadrant of right breast of female, estrogen receptor positive 2012   Pneumonia    PONV (postoperative nausea and vomiting)    Recurrent cancer of right breast 12/22/2013   Thrombocytopenia 05/22/2013    Family History  Adopted: Yes  Problem Relation Age of Onset   Colon cancer Mother 62   Rectal cancer Mother    Cervical cancer Mother    Heart attack Father    Cancer Maternal Aunt 60       stomach and ovarian as separate primaries   Cancer Maternal Aunt 55       breast   Stomach cancer Maternal Aunt    Cancer Maternal Uncle 80  lung; smoker   Cancer Maternal Uncle 63       prostate   Cancer Maternal Grandmother 75       enodometrial   Cancer Cousin 8       melanoma; mat first cousin, located on neck   Past Surgical History:  Procedure Laterality Date   ADENOIDECTOMY     BREAST LUMPECTOMY WITH NEEDLE LOCALIZATION Right 05/05/2013   Procedure: EXCISION RECURRENT CANCER RIGHT BREAST WITH NEEDLE LOCALOZATION;  Surgeon: Adin Hector, MD;  Location: [facility];  Service: General;  Laterality: Right;   BREAST RECONSTRUCTION  06/29/2011   Procedure: BREAST RECONSTRUCTION;  Surgeon: Theodoro Kos, DO;  Location: [facility];  Service: Plastics;   Laterality: Bilateral;  Immediate Bilateral Breast Reconstruction with Bilateral Tissue Expanders and placement of  Alloderm    DILATION AND CURETTAGE OF UTERUS     fibroid tumor surgery     lazy eye     corrective surgery   MASTECTOMY W/ SENTINEL NODE BIOPSY  06/29/2011   Procedure: MASTECTOMY WITH SENTINEL LYMPH NODE BIOPSY;  Surgeon: Adin Hector, MD;  Location: [facility];  Service: General;  Laterality: Right;  right skin spairing mstectomy and right sentinel lymph node biopsy   PORT-A-CATH REMOVAL  03/12/2012   Procedure: MINOR REMOVAL PORT-A-CATH;  Surgeon: Adin Hector, MD;  Location: [facility];  Service: General;  Laterality: N/A;  Upper left   PORTACATH PLACEMENT Left 05/05/2013   Procedure: INSERTION PORT-A-CATH;  Surgeon: Adin Hector, MD;  Location: [facility];  Service: General;  Laterality: Left;   RHINOPLASTY     uterine cervical treatments  2002   for precancerous lesions   Social History   Social History Narrative   She was adopted by her grandparents as a child.  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   Objective: Vital Signs: BP 97/66 (BP Location: Left Arm, Patient Position: Sitting, Cuff Size: Normal)   Pulse 67   Resp 14   Ht 5' 3.5" (1.613 m)   Wt 123 lb (55.8 kg)   LMP 03/11/2011   BMI 21.45 kg/m    Physical Exam Eyes:     Conjunctiva/sclera: Conjunctivae normal.  Cardiovascular:     Rate and Rhythm: Normal rate and regular rhythm.  Pulmonary:     Effort: Pulmonary effort is normal.     Breath sounds: Normal breath sounds.  Lymphadenopathy:     Cervical: No cervical adenopathy.  Skin:    General: Skin is warm and dry.     Comments: Nonspecific nailfold capillary changes on hands  Neurological:     Mental Status: She is alert.   Psychiatric:        Mood and Affect: Mood normal.      Musculoskeletal Exam:  Neck full ROM tenderness to pressure at base of neck paraspinal muscles Shoulders full ROM no tenderness or swelling Elbows full ROM no tenderness or swelling Wrists full ROM no tenderness or swelling Fingers full ROM no tenderness or swelling Paraspinal muscle tenderness to palpation at base of neck, between scapulae, worse is lumbosacral area, no lateral hip pain with pressure or rotation Knees full ROM no tenderness or swelling Ankles full ROM no tenderness or swelling MTPs full ROM no tenderness or swelling,  right 1st MTP bunion present   Investigation: No additional findings.  Imaging: No results found.  Recent Labs: Lab Results  Component Value Date   WBC 5.9 09/30/2021   HGB 13.3 09/30/2021   PLT 300 09/30/2021   NA 141 04/25/2022   K 4.1 04/25/2022   CL 102 04/25/2022   CO2 30 04/25/2022   GLUCOSE 86 04/25/2022   BUN 9 04/25/2022   CREATININE 0.83 04/25/2022   BILITOT 0.6 04/25/2022   ALKPHOS 80 04/25/2022   AST 17 04/25/2022   ALT 12 04/25/2022   PROT 7.1 04/25/2022   ALBUMIN 4.8 04/25/2022   CALCIUM 9.9 04/25/2022   GFRAA >60 08/11/2019    Speciality Comments: No specialty comments available.  Procedures:  No procedures performed Allergies: Tussionex pennkinetic er [hydrocod poli-chlorphe poli er], Morphine, Morphine and related, Penicillin g, and Penicillins   Assessment / Plan:     Visit Diagnoses: Positive ANA (antinuclear antibody)  Abnormal laboratory test result - Plan: ANA, Anti-DNA antibody, double-stranded, C3 and C4, Cyclic citrul peptide antibody, IgG, Sedimentation rate, C-reactive protein, CK, TSH, Thyroid Peroxidase Antibodies (TPO) (REFL)  Low positive ANA with no specific autoantibodies on the checked panel symptoms are somewhat nonspecific but chronic and diffuse.  Does have nonspecific nailfold capillary changes but without obvious peripheral vascular  symptoms.  No pitting edema in lower extremities may be small presence of lymphedema no obvious superficial inflammation noted.  Will repeat ANA titer and pattern also checking double-stranded ENA and serum complements sedimentation rate and CRP for evidence of inflammatory disease activity.  Will also check CCP antibody but I think rheumatoid arthritis unlikely with no peripheral joint synovitis.  Checking basic TSH and thyroperoxidase antibodies as well with the significant fatigue and sometimes underlying cause of positive ANA.  Arthralgia, unspecified joint Chronic back pain, unspecified back location, unspecified back pain laterality  Joint pains are most severe throughout the back but without typical symptoms for inflammatory back pain.  There is no obvious tender point with radiation to definitely indicate myofascial pain that this could also be a component.    Orders: Orders Placed This Encounter  Procedures   ANA   Anti-DNA antibody, double-stranded   C3 and C4   Cyclic citrul peptide antibody, IgG   Sedimentation rate   C-reactive protein   CK   TSH   Thyroid Peroxidase Antibodies (TPO) (REFL)   Anti-nuclear ab-titer (ANA titer)   No orders of the defined types were placed in this encounter.    Follow-Up Instructions: Return in about 3 weeks (around 05/16/2022) for New pt +ANA f/u 3wks.   Collier Salina, MD  Note - This record has been created using [employer].  Chart creation errors have been sought, but may not always  have been located. Such creation errors do not reflect on  the standard of medical care.

## 2022-04-25 ENCOUNTER — Ambulatory Visit: Payer: Commercial Managed Care - HMO | Attending: Internal Medicine | Admitting: Internal Medicine

## 2022-04-25 ENCOUNTER — Other Ambulatory Visit (INDEPENDENT_AMBULATORY_CARE_PROVIDER_SITE_OTHER): Payer: Commercial Managed Care - HMO

## 2022-04-25 ENCOUNTER — Ambulatory Visit (INDEPENDENT_AMBULATORY_CARE_PROVIDER_SITE_OTHER): Payer: Commercial Managed Care - HMO | Admitting: Gastroenterology

## 2022-04-25 ENCOUNTER — Encounter: Payer: Self-pay | Admitting: Gastroenterology

## 2022-04-25 ENCOUNTER — Encounter: Payer: Self-pay | Admitting: Internal Medicine

## 2022-04-25 VITALS — BP 97/66 | HR 67 | Resp 14 | Ht 63.5 in | Wt 123.0 lb

## 2022-04-25 VITALS — BP 92/67 | HR 59 | Ht 63.5 in | Wt 123.0 lb

## 2022-04-25 DIAGNOSIS — R1013 Epigastric pain: Secondary | ICD-10-CM

## 2022-04-25 DIAGNOSIS — K219 Gastro-esophageal reflux disease without esophagitis: Secondary | ICD-10-CM

## 2022-04-25 DIAGNOSIS — R899 Unspecified abnormal finding in specimens from other organs, systems and tissues: Secondary | ICD-10-CM | POA: Insufficient documentation

## 2022-04-25 DIAGNOSIS — K5904 Chronic idiopathic constipation: Secondary | ICD-10-CM

## 2022-04-25 DIAGNOSIS — M549 Dorsalgia, unspecified: Secondary | ICD-10-CM

## 2022-04-25 DIAGNOSIS — M2550 Pain in unspecified joint: Secondary | ICD-10-CM | POA: Diagnosis not present

## 2022-04-25 DIAGNOSIS — K5909 Other constipation: Secondary | ICD-10-CM | POA: Diagnosis not present

## 2022-04-25 DIAGNOSIS — G8929 Other chronic pain: Secondary | ICD-10-CM

## 2022-04-25 DIAGNOSIS — R140 Abdominal distension (gaseous): Secondary | ICD-10-CM

## 2022-04-25 DIAGNOSIS — R768 Other specified abnormal immunological findings in serum: Secondary | ICD-10-CM

## 2022-04-25 DIAGNOSIS — Z853 Personal history of malignant neoplasm of breast: Secondary | ICD-10-CM

## 2022-04-25 LAB — COMPREHENSIVE METABOLIC PANEL
ALT: 12 U/L (ref 0–35)
AST: 17 U/L (ref 0–37)
Albumin: 4.8 g/dL (ref 3.5–5.2)
Alkaline Phosphatase: 80 U/L (ref 39–117)
BUN: 9 mg/dL (ref 6–23)
CO2: 30 mEq/L (ref 19–32)
Calcium: 9.9 mg/dL (ref 8.4–10.5)
Chloride: 102 mEq/L (ref 96–112)
Creatinine, Ser: 0.83 mg/dL (ref 0.40–1.20)
GFR: 81.06 mL/min (ref >60.00)
Glucose, Bld: 86 mg/dL (ref 70–99)
Potassium: 4.1 mEq/L (ref 3.5–5.1)
Sodium: 141 mEq/L (ref 135–145)
Total Bilirubin: 0.6 mg/dL (ref 0.2–1.2)
Total Protein: 7.1 g/dL (ref 6.0–8.3)

## 2022-04-25 LAB — MAGNESIUM: Magnesium: 2 mg/dL (ref 1.5–2.5)

## 2022-04-25 LAB — VITAMIN D 25 HYDROXY (VIT D DEFICIENCY, FRACTURES): VITD: 65.83 ng/mL (ref 30.00–100.00)

## 2022-04-25 MED ORDER — SUCRALFATE 1 G PO TABS: 1.0000 g | ORAL_TABLET | Freq: Two times a day (BID) | ORAL | 3 refills | Status: DC

## 2022-04-25 MED ORDER — LINACLOTIDE 290 MCG PO CAPS: 290.0000 ug | ORAL_CAPSULE | Freq: Every day | ORAL | 3 refills | Status: DC

## 2022-04-25 NOTE — Progress Notes (Signed)
[name]    [id number]    Sep 02, 1970  Primary Care Physician:Crawford, Real Cons, MD  Referring Physician: Hoyt Koch, MD [place],  [place] [id number]   Chief complaint: Epigastric pain radiating to the back, constipation  HPI: 52 year old pleasant female with remote history of breast CA X2 status post bilateral mastectomy and chemotherapy in remission with c/o persistent epigastric pain radiating to the back.  She is worried about cancer recurrence. She had liver lesion noted on CT few years ago and is requesting over read on recent CT abd & pelvis  CT abd & pelvis 03/21/22 1. No acute abnormality identified in the abdomen or pelvis. 2. Moderate to large volume of formed stool throughout the colon suggestive of constipation. 3. Nonobstructive 5 mm left lower pole renal calculus.  Bowel purge and then Linzess 145 mcg daily + senna 2 at bedtime now has BM once every few days, doesn't feel she is evacuating completely  She follow-up with urology, due to increased stool burden and gas, they could not visualize the renal calculus.  She may have passed the stone  EGD 02/22/22 Impression:       - Z-line regular, 36 cm from the incisors.                           - No gross lesions in the entire esophagus.                           - Normal stomach.                           - Normal first portion of the duodenum and second                            portion of the duodenum. Biopsied.  Colonoscopy 02/22/22 Impression:       - Non-bleeding internal hemorrhoids.                           - The examination was otherwise normal.  EGD December 12, 2017: Showed severe gastritis with superficial antral ulcers and duodenal ulcers.  Gastric biopsies negative for H. Pylori Colonoscopy December 12, 2017: Showed internal hemorrhoids otherwise normal exam  She is extremely anxious, worried about her overall health.  She kept repeating the same  questions even after extensive reassurance  Outpatient Encounter Medications as of 04/25/2022  Medication Sig   Acetaminophen-Caffeine (EXCEDRIN TENSION HEADACHE PO) Take by mouth.   Biotin 5000 MCG CAPS Take 5,000 mcg by mouth daily.   Black Pepper-Turmeric (TURMERIC CURCUMIN) 08-998 MG CAPS Take by mouth 2 (two) times daily.   Coenzyme Q10 (COQ10) 150 MG CAPS Take 1 capsule by mouth once.   finasteride (PROSCAR) 5 MG tablet Take 5 mg by mouth daily. 1/2 tablet daily   pantoprazole (PROTONIX) 40 MG tablet Take 1 tablet (40 mg total) by mouth daily.   Prenat-Fe Poly-Methfol-FA-DHA (VITAFOL FE+) 90-0.6-0.4-200 MG CAPS Take 1 tablet by mouth daily.   Ubrogepant (UBRELVY) 100 MG TABS Take 1 tablet by mouth as needed (May repeat in 2 hours.  maximum 2 tablets in 24 hours.).   betamethasone dipropionate 0.05 % cream Apply topically 2 (two) times daily. (Patient not  taking: Reported on 04/25/2022)   Cholecalciferol (VITAMIN D3) 125 MCG (5000 UT) TBDP Take 1 tablet by mouth daily. (Patient not taking: Reported on 04/25/2022)   famotidine (PEPCID) 20 MG tablet Take 1 tablet (20 mg total) by mouth 2 (two) times daily. (Patient not taking: Reported on 04/25/2022)   lidocaine-prilocaine (EMLA) cream Apply 1 application topically as needed. (Patient not taking: Reported on 04/25/2022)   linaclotide (LINZESS) 145 MCG CAPS capsule Take 1 capsule (145 mcg total) by mouth daily before breakfast. (Patient not taking: Reported on 04/25/2022)   metaxalone (SKELAXIN) 400 MG tablet Take 1 tablet (400 mg total) by mouth 3 (three) times daily as needed. (Patient not taking: Reported on 04/25/2022)   methocarbamol (ROBAXIN) 500 MG tablet Take 1 tablet (500 mg total) by mouth 3 (three) times daily. (Patient not taking: Reported on 04/25/2022)   nitrofurantoin, macrocrystal-monohydrate, (MACROBID) 100 MG capsule Take 1 capsule (100 mg total) by mouth 2 (two) times daily. (Patient not taking: Reported on 04/25/2022)   ondansetron  (ZOFRAN) 4 MG tablet Take 1 tablet 30 minutes before starting the colonoscopy prep (Patient not taking: Reported on 04/25/2022)   No facility-administered encounter medications on file as of 04/25/2022.    Allergies as of 04/25/2022 - Review Complete 04/25/2022  Allergen Reaction Noted   Tussionex pennkinetic er [hydrocod poli-chlorphe poli er] Other (See Comments) 01/12/2011   Morphine Rash 05/22/2013   Morphine and related Anxiety 01/12/2011   Penicillin g Rash 05/22/2013   Penicillins Rash 01/12/2011    Past Medical History:  Diagnosis Date   Abdominal pain, epigastric 11/20/2019   Asthma    as a child   Cervical dysplasia    Gastroesophageal reflux disease 11/20/2019   Genetic testing 02/01/2018   Genetic testing was performed in 2015. The [employer] panel was ordered. In total, 23 genes were analyzed as part of this panel: ATM, BARD1, BRCA1, BRCA2, BRIP1, CDH1, CHEK2, EPCAM, MLH1, MRE11A, MSH2, MSH6, MUTYH, NBN, NF1, PALB2, PMS2, PTEN, RAD50, RAD51C, RAD51D, STK11, and TP53. No pathogenic variants were detected. Genetic testing did detect a Variant of Unknown Significance (VUS) at the t   GERD (gastroesophageal reflux disease)    had during chemo   Headache 06/12/2013   History of chemotherapy    History of kidney stones    History of radiation therapy 11/24/13- 01/08/14   reconstructed right breast/chest wall 4500 cGy 25 sessions, electron beam boost 1440 cGy 8 sessions   Liver masses 12/06/2016   Major depressive disorder, in remission 03/12/2017   Malignant neoplasm of lower-inner quadrant of right breast of female, estrogen receptor positive 2012   Pneumonia    PONV (postoperative nausea and vomiting)    Recurrent cancer of right breast 12/22/2013   Thrombocytopenia 05/22/2013    Past Surgical History:  Procedure Laterality Date   ADENOIDECTOMY     BREAST LUMPECTOMY WITH NEEDLE LOCALIZATION Right 05/05/2013   Procedure: EXCISION RECURRENT CANCER RIGHT BREAST WITH NEEDLE  LOCALOZATION;  Surgeon: Adin Hector, MD;  Location: [facility];  Service: General;  Laterality: Right;   BREAST RECONSTRUCTION  06/29/2011   Procedure: BREAST RECONSTRUCTION;  Surgeon: Theodoro Kos, DO;  Location: [facility];  Service: Plastics;  Laterality: Bilateral;  Immediate Bilateral Breast Reconstruction with Bilateral Tissue Expanders and placement of  Alloderm    DILATION AND CURETTAGE OF UTERUS     fibroid tumor surgery     lazy eye     corrective surgery   MASTECTOMY W/ SENTINEL NODE BIOPSY  06/29/2011  Procedure: MASTECTOMY WITH SENTINEL LYMPH NODE BIOPSY;  Surgeon: Adin Hector, MD;  Location: [facility];  Service: General;  Laterality: Right;  right skin spairing mstectomy and right sentinel lymph node biopsy   PORT-A-CATH REMOVAL  03/12/2012   Procedure: MINOR REMOVAL PORT-A-CATH;  Surgeon: Adin Hector, MD;  Location: [facility];  Service: General;  Laterality: N/A;  Upper left   PORTACATH PLACEMENT Left 05/05/2013   Procedure: INSERTION PORT-A-CATH;  Surgeon: Adin Hector, MD;  Location: [facility];  Service: General;  Laterality: Left;   RHINOPLASTY     uterine cervical treatments  2002   for precancerous lesions    Family History  Adopted: Yes  Problem Relation Age of Onset   Colon cancer Mother 36   Rectal cancer Mother    Cervical cancer Mother    Heart attack Father    Cancer Maternal Aunt 60       stomach and ovarian as separate primaries   Cancer Maternal Aunt 55       breast   Stomach cancer Maternal Aunt    Cancer Maternal Uncle 65       lung; smoker   Cancer Maternal Uncle 63       prostate   Cancer Maternal Grandmother 75       enodometrial   Cancer Cousin 59       melanoma; mat first cousin, located on neck    Social History   Socioeconomic History   Marital status: Married    Spouse name: Not on file   Number of children: Not on file   Years of education: 10   Highest education level: GED or equivalent  Occupational History    Not on file  Tobacco Use   Smoking status: Former    Packs/day: 1.00    Years: 4.00    Total pack years: 4.00    Types: Cigarettes    Quit date: 2000    Years since quitting: 24.0    Passive exposure: Past   Smokeless tobacco: Never  Vaping Use   Vaping Use: Never used  Substance and Sexual Activity   Alcohol use: Not Currently    Alcohol/week: 0.0 standard drinks of alcohol   Drug use: No   Sexual activity: Yes    Partners: Male  Other Topics Concern   Not on file  Social History Narrative   She was adopted by her grandparents as a child.  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   Physical Exam: Vitals:   04/25/22 1443  BP: 92/67  Pulse: (Abnormal) 59   Body mass index is 21.45 kg/m. Gen:      No acute distress HEENT:  sclera anicteric Abd:      soft, non-tender; no palpable masses, no distension Ext:    No edema Neuro: alert and oriented x 3 Psych: normal mood and affect  Data Reviewed:  Reviewed labs, radiology  imaging, old records and pertinent past GI work up   Assessment and Plan/Recommendations:  52 year old female with remote history of breast cancer 2012, 2014 status post bilateral mastectomy and chemotherapy.  GERD and dyspepsia: Use pantoprazole 40 mg daily and Pepcid at bedtime as needed She has history severe gastritis with gastric and duodenal ulcers likely secondary to chronic NSAID use.  Resolved on most recent EGD Biopsies negative for celiac  disease, requesting labs will check TTG IgA antibody and alpha gal Use Carafate before meals and at bedtime as needed   She had CT abdomen pelvis with contrast, EGD and colonoscopy, negative for acute pathology to explain abdominal pain Patient is requesting over read of CT, will contact radiology  Constipation: Persistent constipation, will increase Linzess dose to 290 mcg daily Increase dietary fiber and water intake Check vitamin D, CBC and CMP to exclude metabolic etiology for worsening constipation  Return as needed  This visit required >40 minutes of patient care (this includes precharting, chart review, review of results, face-to-face time used for counseling as well as treatment plan and follow-up. The patient was provided an opportunity to ask questions and all were answered. The patient agreed with the plan and demonstrated an understanding of the instructions.  Damaris Hippo , MD    CC: Hoyt Koch, *

## 2022-04-25 NOTE — Patient Instructions (Addendum)
Your provider has requested that you go to the basement level for lab work before leaving today. Press "B" on the elevator. The lab is located at the first door on the left as you exit the elevator.   We have sent the following medications to your pharmacy for you to pick up at your convenience:  Linzess 290 mcg  Carafate  Follow up in 3 months   Due to recent changes in healthcare laws, you may see the results of your imaging and laboratory studies on MyChart before your provider has had a chance to review them.  We understand that in some cases there may be results that are confusing or concerning to you. Not all laboratory results come back in the same time frame and the provider may be waiting for multiple results in order to interpret others.  Please give [place] 48 hours in order for your provider to thoroughly review all the results before contacting the office for clarification of your results.    _______________________________________________________  If your blood pressure at your visit was 140/90 or greater, please contact your primary care physician to follow up on this.  _______________________________________________________  If you are age 53 or older, your body mass index should be between 23-30. Your Body mass index is 21.45 kg/m. If this is out of the aforementioned range listed, please consider follow up with your Primary Care Provider.  If you are age 68 or younger, your body mass index should be between 19-25. Your Body mass index is 21.45 kg/m. If this is out of the aformentioned range listed, please consider follow up with your Primary Care Provider.   ________________________________________________________  The [place] [facility] providers would like to encourage you to use [facility] to communicate with providers for non-urgent requests or questions.  Due to long hold times on the telephone, sending your provider a message by [facility] may be a faster and more efficient way to get a  response.  Please allow 48 business hours for a response.  Please remember that this is for non-urgent requests.  _______________________________________________________   Thank you for choosing [facility]  Kavitha Nandigam,MD

## 2022-04-26 ENCOUNTER — Ambulatory Visit (HOSPITAL_BASED_OUTPATIENT_CLINIC_OR_DEPARTMENT_OTHER): Payer: Commercial Managed Care - HMO | Admitting: Physical Therapy

## 2022-04-26 DIAGNOSIS — M5459 Other low back pain: Secondary | ICD-10-CM

## 2022-04-26 DIAGNOSIS — M542 Cervicalgia: Secondary | ICD-10-CM | POA: Diagnosis not present

## 2022-04-26 DIAGNOSIS — R293 Abnormal posture: Secondary | ICD-10-CM

## 2022-04-26 DIAGNOSIS — M6281 Muscle weakness (generalized): Secondary | ICD-10-CM

## 2022-04-26 NOTE — Therapy (Signed)
OUTPATIENT PHYSICAL THERAPY THORACOLUMBAR TREATMENT   Patient Name: [name] MRN: [id number] DOB:[birth year], 52 y.o., female Today's Date: 04/26/2022   PT End of Session - 04/26/22 1313     Visit Number 20    Number of Visits 34    Date for PT Re-Evaluation 05/12/22    Authorization Type [employer]    PT Start Time 1305    PT Stop Time 1343    PT Time Calculation (min) 38 min    Activity Tolerance Patient tolerated treatment well    Behavior During Therapy [facility] for tasks assessed/performed                         Past Medical History:  Diagnosis Date   Abdominal pain, epigastric 11/20/2019   Asthma    as a child   Cervical dysplasia    Gastroesophageal reflux disease 11/20/2019   Genetic testing 02/01/2018   Genetic testing was performed in 2015. The [employer] OvaNext panel was ordered. In total, 23 genes were analyzed as part of this panel: ATM, BARD1, BRCA1, BRCA2, BRIP1, CDH1, CHEK2, EPCAM, MLH1, MRE11A, MSH2, MSH6, MUTYH, NBN, NF1, PALB2, PMS2, PTEN, RAD50, RAD51C, RAD51D, STK11, and TP53. No pathogenic variants were detected. Genetic testing did detect a Variant of Unknown Significance (VUS) at the t   GERD (gastroesophageal reflux disease)    had during chemo   Headache 06/12/2013   History of chemotherapy    History of kidney stones    History of radiation therapy 11/24/13- 01/08/14   reconstructed right breast/chest wall 4500 cGy 25 sessions, electron beam boost 1440 cGy 8 sessions   Liver masses 12/06/2016   Major depressive disorder, in remission 03/12/2017   Malignant neoplasm of lower-inner quadrant of right breast of female, estrogen receptor positive 2012   Pneumonia    PONV (postoperative nausea and vomiting)    Recurrent cancer of right breast 12/22/2013   Thrombocytopenia 05/22/2013   Past Surgical History:  Procedure Laterality Date   ADENOIDECTOMY     BREAST LUMPECTOMY WITH NEEDLE LOCALIZATION Right 05/05/2013   Procedure: EXCISION RECURRENT  CANCER RIGHT BREAST WITH NEEDLE LOCALOZATION;  Surgeon: Adin Hector, MD;  Location: [place];  Service: General;  Laterality: Right;   BREAST RECONSTRUCTION  06/29/2011   Procedure: BREAST RECONSTRUCTION;  Surgeon: Theodoro Kos, DO;  Location: [place];  Service: Plastics;  Laterality: Bilateral;  Immediate Bilateral Breast Reconstruction with Bilateral Tissue Expanders and placement of  Alloderm    DILATION AND CURETTAGE OF UTERUS     fibroid tumor surgery     lazy eye     corrective surgery   MASTECTOMY W/ SENTINEL NODE BIOPSY  06/29/2011   Procedure: MASTECTOMY WITH SENTINEL LYMPH NODE BIOPSY;  Surgeon: Adin Hector, MD;  Location: [place];  Service: General;  Laterality: Right;  right skin spairing mstectomy and right sentinel lymph node biopsy   PORT-A-CATH REMOVAL  03/12/2012   Procedure: MINOR REMOVAL PORT-A-CATH;  Surgeon: Adin Hector, MD;  Location: [place];  Service: General;  Laterality: N/A;  Upper left   PORTACATH PLACEMENT Left 05/05/2013   Procedure: INSERTION PORT-A-CATH;  Surgeon: Adin Hector, MD;  Location: [place];  Service: General;  Laterality: Left;   RHINOPLASTY     uterine cervical treatments  2002   for precancerous lesions   Patient Active Problem List   Diagnosis Date Noted   Abnormal laboratory test result 04/25/2022   Snoring 07/25/2021  Excessive daytime sleepiness 07/25/2021   Routine general medical examination at a health care facility 07/08/2021   Positive ANA (antinuclear antibody) 03/18/2021   Left leg pain 03/18/2021   Arthralgia 03/11/2021   Back pain 02/26/2021   Neck pain 02/26/2021   Gastroesophageal reflux disease 11/20/2019   Major depressive disorder, in remission 03/12/2017   Liver masses 12/06/2016   Recurrent cancer of right breast 12/22/2013   Headache 06/12/2013   Thrombocytopenia 05/22/2013   Malignant neoplasm of lower-inner quadrant of right breast of female, estrogen receptor positive 2012      REFERRING PROVIDER: Gardenia Phlegm, NP  REFERRING DIAG:  C50.311,Z17.0 (ICD-10-CM) - Malignant neoplasm of lower-inner quadrant of right breast of female, estrogen receptor positive ([facility])  M54.50,G89.29 (ICD-10-CM) - Chronic midline low back pain without sciatica    Rationale for Evaluation and Treatment Rehabilitation  THERAPY DIAG:  Cervicalgia  Muscle weakness (generalized)  Abnormal posture  Other low back pain  ONSET DATE: chronic  SUBJECTIVE:                                                                                                                                                                                           SUBJECTIVE STATEMENT:  Some burning in my back when I am trying to sit. I notice my ankles swelling.   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 Yes: NPRS scale: 5/10 Pain location: low back, neck Pain description: sore Aggravating factors: constant pain Relieving factors: excedrin tension   PRECAUTIONS: None  WEIGHT BEARING RESTRICTIONS No  FALLS:  Has patient fallen in last 6 months? No  PLOF: Independent  PATIENT GOALS decrease pain   OBJECTIVE:   DIAGNOSTIC FINDINGS:  Oct 2022 MRI- hemangioma at L4, fatty infiltration to sacrum.    TODAY'S TREATMENT   Treatment                            04/26/22:  Squat to table tap 10lb kettle bell 2x10 with blue TB at knees T/s paloff rotation 10lbs 10x each way Bird dog row 10lbs 2x10 RDL 2x10 10lbs blue TB at knees   Treatment                            04/12/22:  Squat to table tap 10lb kettle bell Sumo squats Lunges Heel raises- neutral & ER Wall squats: wide row, UE scissors and horiz add reach across Plank squat back Child pose- mid & lateral Figure 4 stretch Supine  hamstring stretch with strap   Treatment                            04/06/22:  HEP quick review of each exercise   Treatment                             03/29/22:  Qped hip ext with ball behind knee Kettle bell squats to tap table   03/15/22  TherEx  PPT with LE extensions x10 B DKTC stretch for lumbar region 5x10 seconds Lumbar rotation stretch 5x5 seconds B Checked hip hinges- did ok but will need more work here needed has poor biomechanics  Manual  Self trigger point release using tennis ball to lumbar spine (supine) Tennis ball MFR B lumbar spine in prone    PATIENT EDUCATION:  Education details: walking recommendations, anatomy, exercise progression, gym safety, HEP, POC  Person educated: Patient Education method: Explanation, Demonstration, Tactile cues, Verbal cues, and Handouts Education comprehension: verbalized understanding, returned demonstration, verbal cues required, tactile cues required, and needs further education   HOME EXERCISE PROGRAM: Access Code: [email or web address] URL: [email or web address]   ASSESSMENT:  CLINICAL IMPRESSION:  Pt able to continue with lumbopelvic strength with significant improvement in motor control with increased difficulty and resistance. Pt also has improved lifting and hip hinging mechanics since last session. Resistance/gym based movements reviewed with pt and pt advised to on progression of previous stability and lifting exercise. Plan to continue with gym based exercise at next session. Pt does have small weights, a weight bench, and kettlebells at home. Pt is making steady improvement with progression towards independent exercise. Given pt's chronic constipation which may contribute to back pain, pt advised to start walking program to aid in gut motility. Pt would benefit from continued skilled therapy in order to reach goals and maximize functional lumbopelvic  strength and ROM for full return to PLOF.   OBJECTIVE IMPAIRMENTS decreased activity tolerance, difficulty walking, increased muscle spasms, postural dysfunction, and pain.   ACTIVITY  LIMITATIONS carrying, lifting, bending, sitting, standing, squatting, sleeping, stairs, and locomotion level  PARTICIPATION LIMITATIONS: meal prep, cleaning, laundry, driving, shopping, and community activity  PERSONAL FACTORS 1-2 comorbidities: benign growth found in lumbar spine, chronic pain, known LLD  are also affecting patient's functional outcome.      GOALS:  SHORT TERM GOALS: Target date:12/06/21  Will determine need for heel lift Baseline: Goal status: achieved   LONG TERM GOALS: Target date: 04/10/2022   Bil hip abd strength to meet age appropriate levels per hand held dynamometry Baseline:  Goal status: Initial  2.  Able to demonstrate proper form in long term core strength Baseline:  Goal status: INITIAL  3.  Pt will be able to tolerate sitting or standing for 30 minute periods without increase in pain Baseline:  Goal status: partially met.  4. Pt will be able to demonstrate/report ability to sit/stand/sleep for extended periods of time without pain in order to demonstrate functional improvement and tolerance to static positioning.  Goal Status: Initial  5. Pt will be able to lift/squat/hold >20 lbs in order to demonstrate functional improvement in lumbopelvic and UE strength for return to ADL/household duties.  Goal Status: initial       PLAN: PT FREQUENCY: 1-2x/week  PT DURATION: 8 weeks  PLANNED INTERVENTIONS: Therapeutic exercises, Therapeutic activity, Neuromuscular re-education, Balance training, Gait training, Patient/Family education, Self Care, Joint mobilization, Stair training,  Aquatic Therapy, Spinal mobilization, Cryotherapy, Moist heat, Taping, Manual therapy, and Re-evaluation.  PLAN FOR NEXT SESSION: continue HEP; build out gym/weight program   Daleen Bo PT, DPT 04/26/22 1:44 PM

## 2022-04-27 ENCOUNTER — Other Ambulatory Visit: Payer: Self-pay

## 2022-04-27 LAB — CYCLIC CITRUL PEPTIDE ANTIBODY, IGG: Cyclic Citrullin Peptide Ab: <16 UNITS

## 2022-04-27 LAB — C-REACTIVE PROTEIN: CRP: 2.4 mg/L (ref <8.0)

## 2022-04-27 LAB — CK: Total CK: 37 U/L (ref 29–143)

## 2022-04-27 LAB — ANA: Anti Nuclear Antibody (ANA): POSITIVE — AB

## 2022-04-27 LAB — SEDIMENTATION RATE: Sed Rate: 6 mm/h (ref 0–30)

## 2022-04-27 LAB — C3 AND C4
C3 Complement: 104 mg/dL (ref 83–193)
C4 Complement: 32 mg/dL (ref 15–57)

## 2022-04-27 LAB — ANTI-NUCLEAR AB-TITER (ANA TITER): ANA Titer 1: 1:80 {titer} — ABNORMAL HIGH

## 2022-04-27 LAB — ANTI-DNA ANTIBODY, DOUBLE-STRANDED: ds DNA Ab: 1 IU/mL

## 2022-04-27 LAB — TSH: TSH: 2.18 mIU/L

## 2022-04-27 LAB — THYROID PEROXIDASE ANTIBODIES (TPO) (REFL): Thyroperoxidase Ab SerPl-aCnc: 1 IU/mL (ref <9)

## 2022-04-28 ENCOUNTER — Encounter: Payer: Self-pay | Admitting: Gastroenterology

## 2022-04-28 ENCOUNTER — Other Ambulatory Visit: Payer: Self-pay

## 2022-04-28 DIAGNOSIS — R160 Hepatomegaly, not elsewhere classified: Secondary | ICD-10-CM

## 2022-04-28 DIAGNOSIS — R194 Change in bowel habit: Secondary | ICD-10-CM

## 2022-04-28 DIAGNOSIS — G8929 Other chronic pain: Secondary | ICD-10-CM

## 2022-04-28 DIAGNOSIS — R1013 Epigastric pain: Secondary | ICD-10-CM

## 2022-04-28 DIAGNOSIS — K769 Liver disease, unspecified: Secondary | ICD-10-CM

## 2022-04-28 LAB — ALPHA-GAL PANEL
Allergen, Mutton, f88: <0.1 kU/L
Allergen, Pork, f26: <0.1 kU/L
Beef: <0.1 kU/L
CLASS: 0
CLASS: 0
Class: 0
GALACTOSE-ALPHA-1,3-GALACTOSE IGE*: <0.1 kU/L (ref <0.10)

## 2022-04-28 LAB — IGA: Immunoglobulin A: 54 mg/dL (ref 47–310)

## 2022-04-28 LAB — INTERPRETATION:

## 2022-04-28 LAB — TISSUE TRANSGLUTAMINASE, IGA: (tTG) Ab, IgA: <1 U/mL

## 2022-05-02 ENCOUNTER — Other Ambulatory Visit (HOSPITAL_BASED_OUTPATIENT_CLINIC_OR_DEPARTMENT_OTHER): Payer: Commercial Managed Care - HMO

## 2022-05-02 ENCOUNTER — Ambulatory Visit (INDEPENDENT_AMBULATORY_CARE_PROVIDER_SITE_OTHER): Payer: Commercial Managed Care - HMO | Admitting: Obstetrics

## 2022-05-02 ENCOUNTER — Encounter: Payer: Self-pay | Admitting: Obstetrics

## 2022-05-02 ENCOUNTER — Telehealth: Payer: Self-pay | Admitting: Internal Medicine

## 2022-05-02 ENCOUNTER — Other Ambulatory Visit (HOSPITAL_COMMUNITY)
Admission: RE | Admit: 2022-05-02 | Discharge: 2022-05-02 | Disposition: A | Discharge status: Home / Self Care | Payer: Commercial Managed Care - HMO | Source: Ambulatory Visit | Attending: Obstetrics | Admitting: Obstetrics

## 2022-05-02 VITALS — BP 93/69 | HR 72 | Ht 63.0 in | Wt 124.0 lb

## 2022-05-02 DIAGNOSIS — Z780 Asymptomatic menopausal state: Secondary | ICD-10-CM

## 2022-05-02 DIAGNOSIS — Z01419 Encounter for gynecological examination (general) (routine) without abnormal findings: Secondary | ICD-10-CM | POA: Insufficient documentation

## 2022-05-02 DIAGNOSIS — Z113 Encounter for screening for infections with a predominantly sexual mode of transmission: Secondary | ICD-10-CM | POA: Diagnosis not present

## 2022-05-02 DIAGNOSIS — N898 Other specified noninflammatory disorders of vagina: Secondary | ICD-10-CM | POA: Insufficient documentation

## 2022-05-02 DIAGNOSIS — K5909 Other constipation: Secondary | ICD-10-CM

## 2022-05-02 DIAGNOSIS — M549 Dorsalgia, unspecified: Secondary | ICD-10-CM

## 2022-05-02 DIAGNOSIS — R140 Abdominal distension (gaseous): Secondary | ICD-10-CM

## 2022-05-02 DIAGNOSIS — E2839 Other primary ovarian failure: Secondary | ICD-10-CM

## 2022-05-02 MED ORDER — METOCLOPRAMIDE HCL 10 MG PO TABS: 10.0000 mg | ORAL_TABLET | Freq: Three times a day (TID) | ORAL | 5 refills | Status: DC

## 2022-05-02 NOTE — Telephone Encounter (Unsigned)
Patient called requesting a return call to discuss her labwork results.  Patient states she also tried to review her office note from the appointment and it was not completed.

## 2022-05-02 NOTE — Progress Notes (Signed)
52 y.o GYN presents for AEX/PAP.  C/o bump and tenderness on the inside of her labia. Pt wants Trans Vag. [place], and Bone density test.

## 2022-05-02 NOTE — Progress Notes (Signed)
Subjective:        [name] is a 52 y.o. female here for a routine exam.  Current complaints: Vaginal discharge, bloating and backache.. Also has chronic constipation.  Personal health questionnaire:  Is patient Ashkenazi Jewish, have a family history of breast and/or ovarian cancer: no Is there a family history of uterine cancer diagnosed at age < 77, gastrointestinal cancer, urinary tract cancer, family member who is a Field seismologist syndrome-associated carrier: no Is the patient overweight and hypertensive, family history of diabetes, personal history of gestational diabetes, preeclampsia or PCOS: no Is patient over 51, have PCOS,  family history of premature CHD under age 87, diabetes, smoke, have hypertension or peripheral artery disease:  no At any time, has a partner hit, kicked or otherwise hurt or frightened you?: no Over the past 2 weeks, have you felt down, depressed or hopeless?: no Over the past 2 weeks, have you felt little interest or pleasure in doing things?:no   Gynecologic History Patient's last menstrual period was 03/11/2011. Contraception: none Last Pap: 2022. Results were: normal Last mammogram: n/a. Results were: normal  Obstetric History OB History  Gravida Para Term Preterm AB Living  '3       2 1  '$ SAB IAB Ectopic Multiple Live Births               # Outcome Date GA Lbr Len/2nd Weight Sex Delivery Anes PTL Lv  3 Gravida           2 AB           1 AB             Past Medical History:  Diagnosis Date   Abdominal pain, epigastric 11/20/2019   Asthma    as a child   Cervical dysplasia    Gastroesophageal reflux disease 11/20/2019   Genetic testing 02/01/2018   Genetic testing was performed in 2015. The [employer] OvaNext panel was ordered. In total, 23 genes were analyzed as part of this panel: ATM, BARD1, BRCA1, BRCA2, BRIP1, CDH1, CHEK2, EPCAM, MLH1, MRE11A, MSH2, MSH6, MUTYH, NBN, NF1, PALB2, PMS2, PTEN, RAD50, RAD51C, RAD51D, STK11, and TP53. No  pathogenic variants were detected. Genetic testing did detect a Variant of Unknown Significance (VUS) at the t   GERD (gastroesophageal reflux disease)    had during chemo   Headache 06/12/2013   History of chemotherapy    History of kidney stones    History of radiation therapy 11/24/13- 01/08/14   reconstructed right breast/chest wall 4500 cGy 25 sessions, electron beam boost 1440 cGy 8 sessions   Liver masses 12/06/2016   Major depressive disorder, in remission 03/12/2017   Malignant neoplasm of lower-inner quadrant of right breast of female, estrogen receptor positive 2012   Pneumonia    PONV (postoperative nausea and vomiting)    Recurrent cancer of right breast 12/22/2013   Thrombocytopenia 05/22/2013    Past Surgical History:  Procedure Laterality Date   ADENOIDECTOMY     BREAST LUMPECTOMY WITH NEEDLE LOCALIZATION Right 05/05/2013   Procedure: EXCISION RECURRENT CANCER RIGHT BREAST WITH NEEDLE LOCALOZATION;  Surgeon: Adin Hector, MD;  Location: [place];  Service: General;  Laterality: Right;   BREAST RECONSTRUCTION  06/29/2011   Procedure: BREAST RECONSTRUCTION;  Surgeon: Theodoro Kos, DO;  Location: [place];  Service: Plastics;  Laterality: Bilateral;  Immediate Bilateral Breast Reconstruction with Bilateral Tissue Expanders and placement of  Alloderm    DILATION AND CURETTAGE OF UTERUS  fibroid tumor surgery     lazy eye     corrective surgery   MASTECTOMY W/ SENTINEL NODE BIOPSY  06/29/2011   Procedure: MASTECTOMY WITH SENTINEL LYMPH NODE BIOPSY;  Surgeon: Adin Hector, MD;  Location: [place];  Service: General;  Laterality: Right;  right skin spairing mstectomy and right sentinel lymph node biopsy   PORT-A-CATH REMOVAL  03/12/2012   Procedure: MINOR REMOVAL PORT-A-CATH;  Surgeon: Adin Hector, MD;  Location: [place];  Service: General;  Laterality: N/A;  Upper left   PORTACATH PLACEMENT Left 05/05/2013   Procedure: INSERTION PORT-A-CATH;  Surgeon: Adin Hector, MD;  Location: [place];  Service: General;  Laterality: Left;   RHINOPLASTY     uterine cervical treatments  2002   for precancerous lesions     Current Outpatient Medications:    Acetaminophen-Caffeine (EXCEDRIN TENSION HEADACHE PO), Take by mouth., Disp: , Rfl:    betamethasone dipropionate 0.05 % cream, Apply topically 2 (two) times daily. (Patient not taking: Reported on 04/25/2022), Disp: , Rfl:    Biotin 5000 MCG CAPS, Take 5,000 mcg by mouth daily., Disp: , Rfl:    Black Pepper-Turmeric (TURMERIC CURCUMIN) 08-998 MG CAPS, Take by mouth 2 (two) times daily., Disp: , Rfl:    Cholecalciferol (VITAMIN D3) 125 MCG (5000 UT) TBDP, Take 1 tablet by mouth daily. (Patient not taking: Reported on 04/25/2022), Disp: , Rfl:    Coenzyme Q10 (COQ10) 150 MG CAPS, Take 1 capsule by mouth once., Disp: , Rfl:    famotidine (PEPCID) 20 MG tablet, Take 1 tablet (20 mg total) by mouth 2 (two) times daily. (Patient not taking: Reported on 04/25/2022), Disp: 60 tablet, Rfl: 3   finasteride (PROSCAR) 5 MG tablet, Take 5 mg by mouth daily. 1/2 tablet daily, Disp: , Rfl:    lidocaine-prilocaine (EMLA) cream, Apply 1 application topically as needed. (Patient not taking: Reported on 04/25/2022), Disp: 100 g, Rfl: 3   linaclotide (LINZESS) 290 MCG CAPS capsule, Take 1 capsule (290 mcg total) by mouth daily before breakfast., Disp: 30 capsule, Rfl: 3   metaxalone (SKELAXIN) 400 MG tablet, Take 1 tablet (400 mg total) by mouth 3 (three) times daily as needed. (Patient not taking: Reported on 04/25/2022), Disp: 90 tablet, Rfl: 5   methocarbamol (ROBAXIN) 500 MG tablet, Take 1 tablet (500 mg total) by mouth 3 (three) times daily. (Patient not taking: Reported on 04/25/2022), Disp: 90 tablet, Rfl: 5   nitrofurantoin, macrocrystal-monohydrate, (MACROBID) 100 MG capsule, Take 1 capsule (100 mg total) by mouth 2 (two) times daily. (Patient not taking: Reported on 04/25/2022), Disp: 14 capsule, Rfl: 0   ondansetron (ZOFRAN) 4  MG tablet, Take 1 tablet 30 minutes before starting the colonoscopy prep (Patient not taking: Reported on 04/25/2022), Disp: 2 tablet, Rfl: 0   pantoprazole (PROTONIX) 40 MG tablet, Take 1 tablet (40 mg total) by mouth daily., Disp: 30 tablet, Rfl: 2   Prenat-Fe Poly-Methfol-FA-DHA (VITAFOL FE+) 90-0.6-0.4-200 MG CAPS, Take 1 tablet by mouth daily., Disp: 30 capsule, Rfl: 11   sucralfate (CARAFATE) 1 g tablet, Take 1 tablet (1 g total) by mouth 2 (two) times daily., Disp: 180 tablet, Rfl: 3   Ubrogepant (UBRELVY) 100 MG TABS, Take 1 tablet by mouth as needed (May repeat in 2 hours.  maximum 2 tablets in 24 hours.)., Disp: 16 tablet, Rfl: 5 Allergies  Allergen Reactions   Tussionex Pennkinetic Er [Hydrocod Poli-Chlorphe Poli Er] Other (See Comments)    hallucinations   Morphine  Rash   Morphine And Related Anxiety    anxiety   Penicillin G Rash   Penicillins Rash    Social History   Tobacco Use   Smoking status: Former    Packs/day: 1.00    Years: 4.00    Total pack years: 4.00    Types: Cigarettes    Quit date: 2000    Years since quitting: 24.0    Passive exposure: Past   Smokeless tobacco: Never  Substance Use Topics   Alcohol use: Not Currently    Alcohol/week: 0.0 standard drinks of alcohol    Family History  Adopted: Yes  Problem Relation Age of Onset   Colon cancer Mother 53   Rectal cancer Mother    Cervical cancer Mother    Heart attack Father    Cancer Maternal Aunt 60       stomach and ovarian as separate primaries   Cancer Maternal Aunt 55       breast   Stomach cancer Maternal Aunt    Cancer Maternal Uncle 65       lung; smoker   Cancer Maternal Uncle 63       prostate   Cancer Maternal Grandmother 75       enodometrial   Cancer Cousin 41       melanoma; mat first cousin, located on neck      Review of Systems  Constitutional: negative for fatigue and weight loss Respiratory: negative for cough and wheezing Cardiovascular: negative for chest pain,  fatigue and palpitations Gastrointestinal: negative for abdominal pain and change in bowel habits Musculoskeletal:negative for myalgias Neurological: negative for gait problems and tremors Behavioral/Psych: negative for abusive relationship, depression Endocrine: negative for temperature intolerance    Genitourinary:negative for abnormal menstrual periods, genital lesions, hot flashes, sexual problems and vaginal discharge Integument/breast: negative for breast lump, breast tenderness, nipple discharge and skin lesion(s)    Objective:       BP 93/69   Pulse 72   Ht '5\' 3"'$  (1.6 m)   Wt 124 lb (56.2 kg)   LMP 03/11/2011   BMI 21.97 kg/m  General:   Alert and no distress  Skin:   no rash or abnormalities  Lungs:   clear to auscultation bilaterally  Heart:   regular rate and rhythm, S1, S2 normal, no murmur, click, rub or gallop  Breasts:   normal without suspicious masses, skin or nipple changes or axillary nodes  Abdomen:  normal findings: no organomegaly, soft, non-tender and no hernia  Pelvis:  External genitalia: normal general appearance Urinary system: urethral meatus normal and bladder without fullness, nontender Vaginal: normal without tenderness, induration or masses Cervix: normal appearance Adnexa: normal bimanual exam Uterus: anteverted and non-tender, normal size   Lab Review Urine pregnancy test Labs reviewed yes Radiologic studies reviewed yes  I have spent a total of 20 minutes of face-to-face time, excluding clinical staff time, reviewing notes and preparing to see patient, ordering tests and/or medications, and counseling the patient.   Assessment:       Plan:    Education reviewed: calcium supplements, depression evaluation, low fat, low cholesterol diet, safe sex/STD prevention, self breast exams, skin cancer screening, and weight bearing exercise. Contraception: status post bilateal mastectomies. Follow up in: 1 year.    Orders Placed This Encounter   Procedures   DG BONE DENSITY (DXA)    Standing Status:   Future    Standing Expiration Date:   05/03/2023    Order Specific Question:  Reason for Exam (SYMPTOM  OR DIAGNOSIS REQUIRED)    Answer:   Hypoestrogenism.  Postmenopause.    Order Specific Question:   Is the patient pregnant?    Answer:   No    Order Specific Question:   Preferred imaging location?    [facility]   US PELVIC COMPLETE WITH TRANSVAGINAL    Standing Status:   Future    Standing Expiration Date:   05/03/2023    Order Specific Question:   Reason for Exam (SYMPTOM  OR DIAGNOSIS REQUIRED)    Answer:   Abdominal bloating.  Backache.    Order Specific Question:   Preferred imaging location?    Answer:   [facility]   HIV antibody (with reflex)   RPR   Hepatitis C Antibody   Hepatitis B Surface AntiGEN     Paola Aleshire A. Jodi Mourning MD 1/23/20240

## 2022-05-03 ENCOUNTER — Telehealth: Payer: Self-pay | Admitting: Gastroenterology

## 2022-05-03 ENCOUNTER — Encounter (HOSPITAL_BASED_OUTPATIENT_CLINIC_OR_DEPARTMENT_OTHER): Payer: Commercial Managed Care - HMO | Admitting: Physical Therapy

## 2022-05-03 LAB — CERVICOVAGINAL ANCILLARY ONLY
Bacterial Vaginitis (gardnerella): NEGATIVE
Candida Glabrata: NEGATIVE
Candida Vaginitis: NEGATIVE
Chlamydia: NEGATIVE
Comment: NEGATIVE
Comment: NEGATIVE
Comment: NEGATIVE
Comment: NEGATIVE
Comment: NEGATIVE
Comment: NORMAL
Neisseria Gonorrhea: NEGATIVE
Trichomonas: NEGATIVE

## 2022-05-03 LAB — HEPATITIS B SURFACE ANTIGEN: Hepatitis B Surface Ag: NEGATIVE

## 2022-05-03 LAB — RPR: RPR Ser Ql: NONREACTIVE

## 2022-05-03 LAB — HIV ANTIBODY (ROUTINE TESTING W REFLEX): HIV Screen 4th Generation wRfx: NONREACTIVE

## 2022-05-03 LAB — HEPATITIS C ANTIBODY: Hep C Virus Ab: NONREACTIVE

## 2022-05-03 NOTE — Telephone Encounter (Signed)
Patient is calling wishing to speak with [facility] regarding a CT scan and some medications she is taking. Please advise

## 2022-05-03 NOTE — Telephone Encounter (Signed)
Patient advised lab results do show a persistent positive ANA result and this is at slightly higher concentration than before- 1:80 as compared to 1:40. All of her other tests do not show any specific evidence for active inflammation or process such as RA or lupus. Based on this Dr. Benjamine Mola does not plan on starting any long term disease specific treatment right now.   Patient advised we can follow up to discuss symptomatic management options for now or we can just observe for now. Sometimes very early with an autoimmune problem the findings can be nonspecific and increase over time, so Dr. Benjamine Mola would want to monitor findings usually  repeat testing after about 6 months.

## 2022-05-03 NOTE — Telephone Encounter (Signed)
Lab results do show a persistent positive ANA result and this is at slightly higher concentration than before- 1:80 as compared to 1:40. All of her other tests do not show any specific evidence for active inflammation or process such as RA or lupus. Based on this I do not plan on starting any long term disease specific treatment right now.  We can follow up to discuss symptomatic management options for now or we can just observe for now. Sometimes very early with an autoimmune problem the findings can be nonspecific and increase over time, so I would want to monitor findings usually I repeat testing after about 6 months.

## 2022-05-04 ENCOUNTER — Other Ambulatory Visit (HOSPITAL_BASED_OUTPATIENT_CLINIC_OR_DEPARTMENT_OTHER): Payer: Commercial Managed Care - HMO

## 2022-05-04 ENCOUNTER — Ambulatory Visit (HOSPITAL_BASED_OUTPATIENT_CLINIC_OR_DEPARTMENT_OTHER)
Admission: RE | Admit: 2022-05-04 | Discharge: 2022-05-04 | Disposition: A | Discharge status: Home / Self Care | Payer: Commercial Managed Care - HMO | Source: Ambulatory Visit | Attending: Obstetrics | Admitting: Obstetrics

## 2022-05-04 DIAGNOSIS — R140 Abdominal distension (gaseous): Secondary | ICD-10-CM | POA: Insufficient documentation

## 2022-05-04 DIAGNOSIS — M549 Dorsalgia, unspecified: Secondary | ICD-10-CM

## 2022-05-04 LAB — URINE CULTURE

## 2022-05-04 NOTE — Telephone Encounter (Signed)
Patient contacted. She was actually referring to the MRI. The order is in for [facility]. Patient can call and set up her appointment. She will do that. She reports she is not having daily bowel movements on Linzess and therefore did not take it today. Denies a bowel movement in the last 24 hours. Encouraged to take the Linzess daily. She states sometimes she also takes a Colace or Senna. "Then I have to stay close to home" due to loose bowel movements. She is also taking Protonix, but did not take it today. She is concerned she will not have enough acid in her stomach. She further states when she takes Protonix and Linzess, her stomach will hurt. Her GYN has given her Reglan. She has not taken it because she is afraid of the side effects.

## 2022-05-06 LAB — CYTOLOGY - PAP
Comment: NEGATIVE
Diagnosis: NEGATIVE
High risk HPV: NEGATIVE

## 2022-05-09 ENCOUNTER — Ambulatory Visit (HOSPITAL_BASED_OUTPATIENT_CLINIC_OR_DEPARTMENT_OTHER): Payer: Commercial Managed Care - HMO | Admitting: Physical Therapy

## 2022-05-09 ENCOUNTER — Encounter (HOSPITAL_BASED_OUTPATIENT_CLINIC_OR_DEPARTMENT_OTHER): Payer: Self-pay | Admitting: Physical Therapy

## 2022-05-09 ENCOUNTER — Telehealth: Payer: Self-pay

## 2022-05-09 NOTE — Telephone Encounter (Signed)
Returned Pts call regarding imaging questions. Pt asked if NP would weigh in on MRI ordered by GI. Pt wants to know if pelvis should be included or whole body PET instead. NP advised that she agrees with GI order and that Pt should follow up with GYN based on [place]. Pt verbalized understanding. Offered phone or in person visit with NP to discuss further, Pt declined at this time. Gave call back number with any questions.

## 2022-05-10 ENCOUNTER — Encounter (HOSPITAL_BASED_OUTPATIENT_CLINIC_OR_DEPARTMENT_OTHER): Payer: Self-pay | Admitting: Physical Therapy

## 2022-05-10 ENCOUNTER — Ambulatory Visit (HOSPITAL_BASED_OUTPATIENT_CLINIC_OR_DEPARTMENT_OTHER): Payer: Commercial Managed Care - HMO | Admitting: Physical Therapy

## 2022-05-10 DIAGNOSIS — M542 Cervicalgia: Secondary | ICD-10-CM | POA: Diagnosis not present

## 2022-05-10 DIAGNOSIS — M6281 Muscle weakness (generalized): Secondary | ICD-10-CM

## 2022-05-10 NOTE — Therapy (Signed)
OUTPATIENT PHYSICAL THERAPY THORACOLUMBAR TREATMENT   Patient Name: [name] MRN: [id number] DOB:[birth year], 52 y.o., female Today's Date: 05/10/2022   PT End of Session - 05/10/22 1323     Visit Number 22    Number of Visits 34    Date for PT Re-Evaluation 07/07/22    Authorization Type [employer]    PT Start Time 1322    PT Stop Time 1400    PT Time Calculation (min) 38 min    Activity Tolerance Patient tolerated treatment well    Behavior During Therapy [facility] for tasks assessed/performed                         Past Medical History:  Diagnosis Date   Abdominal pain, epigastric 11/20/2019   Asthma    as a child   Cervical dysplasia    Gastroesophageal reflux disease 11/20/2019   Genetic testing 02/01/2018   Genetic testing was performed in 2015. The [employer] panel was ordered. In total, 23 genes were analyzed as part of this panel: ATM, BARD1, BRCA1, BRCA2, BRIP1, CDH1, CHEK2, EPCAM, MLH1, MRE11A, MSH2, MSH6, MUTYH, NBN, NF1, PALB2, PMS2, PTEN, RAD50, RAD51C, RAD51D, STK11, and TP53. No pathogenic variants were detected. Genetic testing did detect a Variant of Unknown Significance (VUS) at the t   GERD (gastroesophageal reflux disease)    had during chemo   Headache 06/12/2013   History of chemotherapy    History of kidney stones    History of radiation therapy 11/24/13- 01/08/14   reconstructed right breast/chest wall 4500 cGy 25 sessions, electron beam boost 1440 cGy 8 sessions   Liver masses 12/06/2016   Major depressive disorder, in remission 03/12/2017   Malignant neoplasm of lower-inner quadrant of right breast of female, estrogen receptor positive 2012   Pneumonia    PONV (postoperative nausea and vomiting)    Recurrent cancer of right breast 12/22/2013   Thrombocytopenia 05/22/2013   Past Surgical History:  Procedure Laterality Date   ADENOIDECTOMY     BREAST LUMPECTOMY WITH NEEDLE LOCALIZATION Right 05/05/2013   Procedure: EXCISION RECURRENT  CANCER RIGHT BREAST WITH NEEDLE LOCALOZATION;  Surgeon: Adin Hector, MD;  Location: [place];  Service: General;  Laterality: Right;   BREAST RECONSTRUCTION  06/29/2011   Procedure: BREAST RECONSTRUCTION;  Surgeon: Theodoro Kos, DO;  Location: [employer];  Service: Plastics;  Laterality: Bilateral;  Immediate Bilateral Breast Reconstruction with Bilateral Tissue Expanders and placement of  Alloderm    DILATION AND CURETTAGE OF UTERUS     fibroid tumor surgery     lazy eye     corrective surgery   MASTECTOMY W/ SENTINEL NODE BIOPSY  06/29/2011   Procedure: MASTECTOMY WITH SENTINEL LYMPH NODE BIOPSY;  Surgeon: Adin Hector, MD;  Location: [place];  Service: General;  Laterality: Right;  right skin spairing mstectomy and right sentinel lymph node biopsy   PORT-A-CATH REMOVAL  03/12/2012   Procedure: MINOR REMOVAL PORT-A-CATH;  Surgeon: Adin Hector, MD;  Location: [place];  Service: General;  Laterality: N/A;  Upper left   PORTACATH PLACEMENT Left 05/05/2013   Procedure: INSERTION PORT-A-CATH;  Surgeon: Adin Hector, MD;  Location: [place];  Service: General;  Laterality: Left;   RHINOPLASTY     uterine cervical treatments  2002   for precancerous lesions   Patient Active Problem List   Diagnosis Date Noted   Abnormal laboratory test result 04/25/2022   Snoring 07/25/2021  Excessive daytime sleepiness 07/25/2021   Routine general medical examination at a health care facility 07/08/2021   Positive ANA (antinuclear antibody) 03/18/2021   Left leg pain 03/18/2021   Arthralgia 03/11/2021   Back pain 02/26/2021   Neck pain 02/26/2021   Gastroesophageal reflux disease 11/20/2019   Major depressive disorder, in remission 03/12/2017   Liver masses 12/06/2016   Recurrent cancer of right breast 12/22/2013   Headache 06/12/2013   Thrombocytopenia 05/22/2013   Malignant neoplasm of lower-inner quadrant of right breast of female, estrogen receptor positive 2012      REFERRING PROVIDER: Gardenia Phlegm, NP  REFERRING DIAG:  C50.311,Z17.0 (ICD-10-CM) - Malignant neoplasm of lower-inner quadrant of right breast of female, estrogen receptor positive ([place])  M54.50,G89.29 (ICD-10-CM) - Chronic midline low back pain without sciatica    Rationale for Evaluation and Treatment Rehabilitation  THERAPY DIAG:  Cervicalgia  Muscle weakness (generalized)  ONSET DATE: chronic  SUBJECTIVE:                                                                                                                                                                                           SUBJECTIVE STATEMENT:  There are still times almost daily when I have pain but overall it is good. There are still times when I need to sit/lay down.   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 Yes: NPRS scale: 5/10 Pain location: low back, neck Pain description: sore Aggravating factors: constant pain Relieving factors: excedrin tension   PRECAUTIONS: None  WEIGHT BEARING RESTRICTIONS No  FALLS:  Has patient fallen in last 6 months? No  PLOF: Independent  PATIENT GOALS decrease pain   OBJECTIVE:   DIAGNOSTIC FINDINGS:  Oct 2022 MRI- hemangioma at L4, fatty infiltration to sacrum.    TODAY'S TREATMENT   Treatment                            05/10/22:  Reviewed goals and discussed plan of care Bent over rows 10 pounds each hand Deadlifts with work on forearm and education for proper hip hinge Reviewed importance of exercise frequency   Treatment                            04/26/22:  Squat to table tap 10lb kettle bell 2x10 with blue TB at knees T/s paloff rotation 10lbs 10x each way Bird dog row 10lbs 2x10 RDL 2x10 10lbs blue TB at knees   Treatment  04/12/22:  Squat to table tap 10lb kettle bell Sumo squats Lunges Heel raises- neutral & ER Wall squats: wide  row, UE scissors and horiz add reach across Plank squat back Child pose- mid & lateral Figure 4 stretch Supine hamstring stretch with strap    PATIENT EDUCATION:  Education details: walking recommendations, anatomy, exercise progression, gym safety, HEP, POC  Person educated: Patient Education method: Explanation, Demonstration, Tactile cues, Verbal cues, and Handouts Education comprehension: verbalized understanding, returned demonstration, verbal cues required, tactile cues required, and needs further education   HOME EXERCISE PROGRAM: Access Code: [email or web address] URL: [email or web address]   ASSESSMENT:  CLINICAL IMPRESSION:  Patient has made improvements since beginning physical therapy as she is now able to progress a readable strength into hand-held dynamometer versus before when she was unable to demonstrate a Gross 5 out of 5 manual muscle test and lower extremity hip abduction.  At this point she is very high-level and we discussed the importance of regular exercise to improve overall strength and functional endurance.  Plan of care will be extended through the end of March, through this point she will attend physical therapy every other week in order to advanced home exercise program and check form as she continues to increase endurance and strength challenges.  OBJECTIVE IMPAIRMENTS decreased activity tolerance, difficulty walking, increased muscle spasms, postural dysfunction, and pain.   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      GOALS:  SHORT TERM GOALS: Target date:12/06/21  Will determine need for heel lift Baseline: Goal status:  achieved   LONG TERM GOALS: Target date: 04/10/2022   Bil hip abd strength to meet age appropriate levels per hand held dynamometry Baseline: Lt: 24, 26, 29 lb   Rt: 2 30 28; age appropriate is 45lb Goal status: Ongoing  2.  Able to demonstrate proper form in long term core strength Baseline:  Goal status: Ongoing  3.  Pt will be able to tolerate sitting or standing for 30 minute periods without increase in pain Baseline:  Goal status: partially met.  4. Pt will be able to demonstrate/report ability to sit/stand/sleep for extended periods of time without pain in order to demonstrate functional improvement and tolerance to static positioning.  Goal Status: Patient reports she feels better but still continues to feel discomfort across her lower back in static positions  5. Pt will be able to lift/squat/hold >20 lbs in order to demonstrate functional improvement in lumbopelvic and UE strength for return to ADL/household duties.  Goal Status: achieved       PLAN: PT FREQUENCY: Every other week PT DURATION: 8 weeks  PLANNED INTERVENTIONS: Therapeutic exercises, Therapeutic activity, Neuromuscular re-education, Balance training, Gait training, Patient/Family education, Self Care, Joint mobilization, Stair training, Aquatic Therapy, Spinal mobilization, Cryotherapy, Moist heat, Taping, Manual therapy, and Re-evaluation.  PLAN FOR NEXT SESSION: continue HEP; build out gym/weight program, continue to educate on proper hip hinge form, plank progressions for core stability  Lucien Budney C. Imaan Padgett PT, DPT 05/10/22 7:31 PM

## 2022-05-11 ENCOUNTER — Telehealth: Payer: Self-pay | Admitting: Gastroenterology

## 2022-05-11 NOTE — Telephone Encounter (Signed)
Inbound call from patient requesting to speak to nurse regarding her MRI and new symptoms that have occurred over the last few days since the last conversation. Please advise.

## 2022-05-11 NOTE — Telephone Encounter (Signed)
Ok. Thank you.

## 2022-05-12 NOTE — Telephone Encounter (Signed)
Patient had a pelvic u/s. She is scheduled for an endometrial biopsy with her GYN Dr Jodi Mourning and Dr Rip Harbour. She wants to know if you would add an MRI of the pelvis as well as the MRI of the abdomen.

## 2022-05-12 NOTE — Telephone Encounter (Signed)
Her insurance will not allow because she does not have any GI indication, she can request her GYN to order MRI pelvis.  Thank you

## 2022-05-15 NOTE — Telephone Encounter (Signed)
Patient advised.

## 2022-05-16 ENCOUNTER — Ambulatory Visit (HOSPITAL_BASED_OUTPATIENT_CLINIC_OR_DEPARTMENT_OTHER): Payer: Commercial Managed Care - HMO | Admitting: Physical Therapy

## 2022-05-23 ENCOUNTER — Ambulatory Visit
Admission: RE | Admit: 2022-05-23 | Discharge: 2022-05-23 | Disposition: A | Discharge status: Home / Self Care | Payer: Commercial Managed Care - HMO | Source: Ambulatory Visit | Attending: Gastroenterology

## 2022-05-23 DIAGNOSIS — R160 Hepatomegaly, not elsewhere classified: Secondary | ICD-10-CM

## 2022-05-23 DIAGNOSIS — G8929 Other chronic pain: Secondary | ICD-10-CM

## 2022-05-23 DIAGNOSIS — K769 Liver disease, unspecified: Secondary | ICD-10-CM

## 2022-05-23 DIAGNOSIS — R194 Change in bowel habit: Secondary | ICD-10-CM

## 2022-05-23 MED ORDER — GADOPICLENOL 0.5 MMOL/ML IV SOLN
6.0000 mL | Freq: Once | INTRAVENOUS | Status: AC | PRN
  Administered 2022-05-23: 6 mL via INTRAVENOUS

## 2022-05-24 ENCOUNTER — Telehealth: Payer: Self-pay

## 2022-05-24 ENCOUNTER — Ambulatory Visit: Payer: Commercial Managed Care - HMO | Admitting: Obstetrics and Gynecology

## 2022-05-24 ENCOUNTER — Encounter (HOSPITAL_BASED_OUTPATIENT_CLINIC_OR_DEPARTMENT_OTHER): Payer: Commercial Managed Care - HMO | Admitting: Physical Therapy

## 2022-05-24 ENCOUNTER — Encounter: Payer: Self-pay | Admitting: Obstetrics and Gynecology

## 2022-05-24 ENCOUNTER — Ambulatory Visit (HOSPITAL_BASED_OUTPATIENT_CLINIC_OR_DEPARTMENT_OTHER): Payer: Commercial Managed Care - HMO | Attending: Orthopaedic Surgery | Admitting: Physical Therapy

## 2022-05-24 ENCOUNTER — Other Ambulatory Visit (HOSPITAL_COMMUNITY)
Admission: RE | Admit: 2022-05-24 | Discharge: 2022-05-24 | Disposition: A | Discharge status: Home / Self Care | Payer: Commercial Managed Care - HMO | Source: Ambulatory Visit | Attending: Obstetrics and Gynecology | Admitting: Obstetrics and Gynecology

## 2022-05-24 VITALS — BP 88/58 | HR 85 | Wt 125.0 lb

## 2022-05-24 DIAGNOSIS — R9389 Abnormal findings on diagnostic imaging of other specified body structures: Secondary | ICD-10-CM | POA: Insufficient documentation

## 2022-05-24 NOTE — Telephone Encounter (Signed)
Pt called and verbalized concerns regarding MRI from 2/12 and would like to see Wilber Bihari, NP to further discuss and compare MRIs. She asked extensive questions in relation to changes in the MRIs. Advised pt I am not able to make those comparisons and she would need and OV. She is agreeable and will come in 05/25/22 at 2:15 to see Mendel Ryder, NP.

## 2022-05-24 NOTE — Progress Notes (Signed)
Patient ID: [name], female   DOB: June 25, 1970, 52 y.o.   MRN: [id number] Ms Heis presents to discuss GYN U/S results Pt referred by Dr Jodi Mourning Pt with H/O Breast with recurrence. H/O Tamoxifen use in the past Had GYN U/S due to pelvic/back pain. She denies any vaginal bleeding GYN U/S reveled thick endometrium with cystic features. 27 gms  H/O TSVD x 1 (@ 8#)  PE AF VSS Chaperone present  ENDOMETRIAL BIOPSY     The indications for endometrial biopsy were reviewed.   Risks of the biopsy including cramping, bleeding, infection, uterine perforation, inadequate specimen and need for additional procedures  were discussed. The patient states she understands and agrees to undergo procedure today. Consent was signed. Time out was performed. Urine HCG was negative. During the pelvic exam, the cervix was prepped with Betadine. A single-toothed tenaculum was placed on the anterior lip of the cervix to stabilize it. The 3 mm pipelle was introduced into the endometrial cavity without difficulty to a depth of 6 cm, and a scant amount of tissue was obtained and sent to pathology. The instruments were removed from the patient's vagina. Minimal bleeding from the cervix was noted. The patient tolerated the procedure well. Routine post-procedure instructions were given to the patient.     A/P Thicken endometrium in setting of breast cancer  EMBX completed today. F/U per test results

## 2022-05-24 NOTE — Progress Notes (Unsigned)
Office Visit Note  Patient: [name]             Date of Birth: 09-Mar-1971           MRN: [id number]             PCP: Hoyt Koch, MD Referring: Hoyt Koch, * Visit Date: 05/25/2022   Subjective:  No chief complaint on file.   History of Present Illness: [name] is a 52 y.o. female here for follow up ***   Previous HPI 04/25/22 [name] is a 52 y.o. female here for evaluation of positive ANA checked in association with ongoing symptoms including joint pain and fatigue most predominantly.  She has had some chronic issues with back pain and multiple levels attributed to some degenerative joint disease in the spine that is more longstanding.  However for about the past 1 year she has increase in symptoms and also experiencing some more pain in other areas her extremities and in bilateral hips.  There is more tenderness to pressure in the distal legs on both sides below the level of the knee.  She notices some possible swelling involving lower extremities but none localized to specific joint areas.  She does not notice any particular erythema or warmth in affected areas.  She tends to notice worsening after period of increased activity or exertion taking 1 or 2 days to recover from fatigue and aches.  She was taking Excedrin which was partially helpful for symptoms but stopped due to irritation with upper abdominal discomfort. Outside of joint problems she experiences persistent fatigue with a feeling of very low energy level.  She does not notice any particular sleep disruption and is not sleepy or taking naps during the daytime.   She has chronic gastrointestinal symptoms with pretty severe constipation predominant irritable bowel symptoms.  She also has some history of GERD without known upper GI complication.  She has had multiple liver masses present on CT imaging but with no high risk features identified and negative for increased metabolic  activity on PET scan.   She reports generalized alopecia with some hair thinning or breaking without any focal bald spots or rash or scarring on the scalp.  Skin sensitivity to light with occasional erythema but no persistent rashes.  She has some chronic dryness affecting eyes and mouth no history of oral or nasal ulcers.  No persistent lymphadenopathy.  She denies typical Raynaud's symptoms with no fingernail changes.   Breast cancer Hx [name] is a 52 year old woman with h/o ER positive breast cancer diagnosed in 2012 s/p bilateral mastectomies, chemotherapy, and tamoxifen. She developed recurrence in 2015, stage IA triple positive breast cancer s/p TDM1, adjuvant radiation, and unable to tolerate long term antiestrogen therapy.    Labs reviewed 02/2021 ANA 1:40 homogenous dsDNA, Sm, SSA, SSB, Scl-70 Abs neg RF neg   Imaging reviewed 01/20/21 MRI Lumbar spine Disc levels: T12-L1: Negative. L1-2: Negative. L2-3: Negative. L3-4: A rightward disc protrusion is present. Mild facet hypertrophy is noted bilaterally. Mild right foraminal narrowing is new. The central canal and left foramen are patent. L4-5: A broad-based disc protrusion is asymmetric to the right. Mild facet hypertrophy and ligamentum flavum thickening are noted. Moderate right subarticular narrowing is present. Progressive moderate foraminal narrowing is noted bilaterally, right greater than left. L5-S1: Mild facet hypertrophy is noted bilaterally. No significant disc protrusion or stenosis is present.   03/21/21 Xray left knee Trace patellofemoral spurring. Otherwise unremarkable  radiographic appearance.   No Rheumatology ROS completed.   PMFS History:  Patient Active Problem List   Diagnosis Date Noted   Endometrial thickening on ultrasound 05/24/2022   Abnormal laboratory test result 04/25/2022   Snoring 07/25/2021   Excessive daytime sleepiness 07/25/2021   Routine general medical examination at a health care  facility 07/08/2021   Positive ANA (antinuclear antibody) 03/18/2021   Left leg pain 03/18/2021   Arthralgia 03/11/2021   Back pain 02/26/2021   Neck pain 02/26/2021   Gastroesophageal reflux disease 11/20/2019   Major depressive disorder, in remission 03/12/2017   Liver masses 12/06/2016   Recurrent cancer of right breast 12/22/2013   Headache 06/12/2013   Thrombocytopenia 05/22/2013   Malignant neoplasm of lower-inner quadrant of right breast of female, estrogen receptor positive 2012    Past Medical History:  Diagnosis Date   Abdominal pain, epigastric 11/20/2019   Asthma    as a child   Cervical dysplasia    Gastroesophageal reflux disease 11/20/2019   Genetic testing 02/01/2018   Genetic testing was performed in 2015. The [facility] panel was ordered. In total, 23 genes were analyzed as part of this panel: ATM, BARD1, BRCA1, BRCA2, BRIP1, CDH1, CHEK2, EPCAM, MLH1, MRE11A, MSH2, MSH6, MUTYH, NBN, NF1, PALB2, PMS2, PTEN, RAD50, RAD51C, RAD51D, STK11, and TP53. No pathogenic variants were detected. Genetic testing did detect a Variant of Unknown Significance (VUS) at the t   GERD (gastroesophageal reflux disease)    had during chemo   Headache 06/12/2013   History of chemotherapy    History of kidney stones    History of radiation therapy 11/24/13- 01/08/14   reconstructed right breast/chest wall 4500 cGy 25 sessions, electron beam boost 1440 cGy 8 sessions   Liver masses 12/06/2016   Major depressive disorder, in remission 03/12/2017   Malignant neoplasm of lower-inner quadrant of right breast of female, estrogen receptor positive 2012   Pneumonia    PONV (postoperative nausea and vomiting)    Recurrent cancer of right breast 12/22/2013   Thrombocytopenia 05/22/2013    Family History  Adopted: Yes  Problem Relation Age of Onset   Colon cancer Mother 30   Rectal cancer Mother    Cervical cancer Mother    Heart attack Father    Cancer Maternal Aunt 60       stomach and ovarian  as separate primaries   Cancer Maternal Aunt 55       breast   Stomach cancer Maternal Aunt    Cancer Maternal Uncle 65       lung; smoker   Cancer Maternal Uncle 63       prostate   Cancer Maternal Grandmother 75       enodometrial   Cancer Cousin 46       melanoma; mat first cousin, located on neck   Past Surgical History:  Procedure Laterality Date   ADENOIDECTOMY     BREAST LUMPECTOMY WITH NEEDLE LOCALIZATION Right 05/05/2013   Procedure: EXCISION RECURRENT CANCER RIGHT BREAST WITH NEEDLE LOCALOZATION;  Surgeon: Adin Hector, MD;  Location: [facility];  Service: General;  Laterality: Right;   BREAST RECONSTRUCTION  06/29/2011   Procedure: BREAST RECONSTRUCTION;  Surgeon: Theodoro Kos, DO;  Location: [facility];  Service: Plastics;  Laterality: Bilateral;  Immediate Bilateral Breast Reconstruction with Bilateral Tissue Expanders and placement of  Alloderm    DILATION AND CURETTAGE OF UTERUS     fibroid tumor surgery     lazy eye  corrective surgery   MASTECTOMY W/ SENTINEL NODE BIOPSY  06/29/2011   Procedure: MASTECTOMY WITH SENTINEL LYMPH NODE BIOPSY;  Surgeon: Adin Hector, MD;  Location: [facility];  Service: General;  Laterality: Right;  right skin spairing mstectomy and right sentinel lymph node biopsy   PORT-A-CATH REMOVAL  03/12/2012   Procedure: MINOR REMOVAL PORT-A-CATH;  Surgeon: Adin Hector, MD;  Location: [facility];  Service: General;  Laterality: N/A;  Upper left   PORTACATH PLACEMENT Left 05/05/2013   Procedure: INSERTION PORT-A-CATH;  Surgeon: Adin Hector, MD;  Location: [facility];  Service: General;  Laterality: Left;   RHINOPLASTY     uterine cervical treatments  2002   for precancerous lesions   Social History   Social History Narrative   She was adopted by her grandparents as a child.  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   Objective: Vital Signs: LMP 03/11/2011    Physical Exam   Musculoskeletal Exam: ***  CDAI Exam: CDAI Score: -- Patient Global: --; Provider Global: -- Swollen: --; Tender: -- Joint Exam 05/25/2022   No joint exam has been documented for this visit   There is currently no information documented on the homunculus. Go to the Rheumatology activity and complete the homunculus joint exam.  Investigation: No additional findings.  Imaging: MR Abdomen W Wo Contrast  Result Date: 05/23/2022 CLINICAL DATA:  A 52 year old female presents with history of abdominal pain and suspected liver abnormality. There is also a reported history of breast cancer. EXAM: MRI ABDOMEN WITHOUT AND WITH CONTRAST TECHNIQUE: Multiplanar multisequence MR imaging of the abdomen was performed both before and after the administration of intravenous contrast. CONTRAST:  6 mL VUEWAY COMPARISON:  December of 2023 and multiple prior imaging studies. FINDINGS: Lower chest: Incidental imaging of the lung bases is unremarkable to the extent evaluated on this abdominal MRI. Hepatobiliary: Hypervascular lesion in the posterior RIGHT hemiliver (image 24/14) 14 mm, 13 mm in May of 2021 on CT of the chest. (Image 36/14) 13 mm lesion along the margin of the LEFT hemiliver also present as far back as May of 2021. Vague area of enhancement just medial to this approximately 6 mm on image 33/14 similarly little changed dating back to 2021. The portal vein is patent. No additional focal hepatic lesions. No biliary duct dilation. Small amount of sludge in the gallbladder. Pancreas: Normal intrinsic T1 signal. No ductal dilation or sign of inflammation. 3 mm T2 bright lesion in the head of the pancreas (image 19/8) not substantially changed compared to more remote imaging dating back to 2021. Spleen:  Normal. Adrenals/Urinary Tract:  Adrenal glands are normal.  Symmetric renal enhancement without signs of hydronephrosis. No suspicious renal lesion or perinephric stranding. Stomach/Bowel: Unremarkable to the extent evaluated on this abdominal MRI. Vascular/Lymphatic: No pathologically enlarged lymph nodes identified. No abdominal aortic aneurysm demonstrated. Other:  No ascites Musculoskeletal: No suspicious bone lesions identified. IMPRESSION: 1. Hypervascular lesions in the liver not substantially changed since 2021 favor benign or indolent process such as vascular shunt lesions, FNH or hepatic adenomata. 2. No additional focal hepatic lesions. 3. Small amount of sludge in the gallbladder. 4. 3 mm T2 bright lesion in the head of the pancreas not substantially changed compared  to more remote imaging dating back to 2021. This may represent a small side branch intraductal papillary mucinous neoplasm. Attention on follow-up. Would also consider 1 year follow-up with MRI/MRCP document stability. Electronically Signed   By: Zetta Bills M.D.   On: 05/23/2022 15:20   US PELVIC COMPLETE WITH TRANSVAGINAL  Result Date: 05/04/2022 CLINICAL DATA:  Abdominal bloating, backache, history of breast cancer EXAM: TRANSABDOMINAL AND TRANSVAGINAL ULTRASOUND OF PELVIS TECHNIQUE: Both transabdominal and transvaginal ultrasound examinations of the pelvis were performed. Transabdominal technique was performed for global imaging of the pelvis including uterus, ovaries, adnexal regions, and pelvic cul-de-sac. It was necessary to proceed with endovaginal exam following the transabdominal exam to visualize the uterus, endometrium, and ovaries. COMPARISON:  12/27/2020 FINDINGS: Uterus Measurements: 5.3 x 2.8 x 3.5 cm = volume: 27 mL. Retroverted. Normal morphology without mass. Endometrium Thickness: 9 mm.  Tiny cystic areas, nonspecific.  No discrete mass. Right ovary Measurements: 2.8 x 0.9 x 0.9 cm = volume: 1.1 mL. Normal morphology without mass Left ovary Measurements: 2.7 x 1.0 x 0.9 cm  = volume: 1.3 mL. Normal morphology without mass Other findings No free pelvic fluid or adnexal masses. IMPRESSION: Thickened endometrial complex 9 mm thick containing tiny cystic areas, nonspecific, could be related to Tamoxifen but cannot exclude malignancy; endometrial sampling should be considered to exclude carcinoma. These results will be called to the ordering clinician or representative by the Radiologist Assistant, and communication documented in the PACS or [employer]. Electronically Signed   By: Lavonia Dana M.D.   On: 05/04/2022 15:26    Recent Labs: Lab Results  Component Value Date   WBC 5.9 09/30/2021   HGB 13.3 09/30/2021   PLT 300 09/30/2021   NA 141 04/25/2022   K 4.1 04/25/2022   CL 102 04/25/2022   CO2 30 04/25/2022   GLUCOSE 86 04/25/2022   BUN 9 04/25/2022   CREATININE 0.83 04/25/2022   BILITOT 0.6 04/25/2022   ALKPHOS 80 04/25/2022   AST 17 04/25/2022   ALT 12 04/25/2022   PROT 7.1 04/25/2022   ALBUMIN 4.8 04/25/2022   CALCIUM 9.9 04/25/2022   GFRAA >60 08/11/2019    Speciality Comments: No specialty comments available.  Procedures:  No procedures performed Allergies: Tussionex pennkinetic er [hydrocod poli-chlorphe poli er], Morphine, Morphine and related, Penicillin g, and Penicillins   Assessment / Plan:     Visit Diagnoses: No diagnosis found.  ***  Orders: No orders of the defined types were placed in this encounter.  No orders of the defined types were placed in this encounter.    Follow-Up Instructions: No follow-ups on file.   Collier Salina, MD  Note - This record has been created using [employer].  Chart creation errors have been sought, but may not always  have been located. Such creation errors do not reflect on  the standard of medical care.

## 2022-05-24 NOTE — Telephone Encounter (Signed)
Patient calls wanting to be seen. "Something is wrong with my stomach." "I cannot have a bowel movement on my own." She has an appointment for follow up 06/19/22. The patient is not taking Protonix, famotidine, Linzess, or Carafate. She has taken them but thinks these were causing her to have worse symptoms. She feels the Protonix caused increased abdominal pain. The Linzess caused abdominal cramps. The Carafate could cause constipation.  Prior to the MRI, the patient purged with 14 doses of Miralax and a clear liquid diet. I did advise her, she may not have a regular bowel movement for a couple of days after the purge and diet. She complains she does not have an appetite and has lost 10 pounds.  On the MRI results, she is concerned that there has not been a mention in the past of any sludge of her gall bladder, or about the findings on the pancreas. Overall she is unsatisfied with her current state of health.

## 2022-05-24 NOTE — Progress Notes (Signed)
Pt would like to discuss ?endometrail biopsy and/or  Surgery.

## 2022-05-25 ENCOUNTER — Encounter: Payer: Self-pay | Admitting: Internal Medicine

## 2022-05-25 ENCOUNTER — Ambulatory Visit (INDEPENDENT_AMBULATORY_CARE_PROVIDER_SITE_OTHER): Payer: Commercial Managed Care - HMO | Admitting: Internal Medicine

## 2022-05-25 VITALS — BP 90/63 | HR 75 | Resp 12 | Ht 63.0 in | Wt 122.0 lb

## 2022-05-25 DIAGNOSIS — K5900 Constipation, unspecified: Secondary | ICD-10-CM | POA: Insufficient documentation

## 2022-05-25 DIAGNOSIS — M542 Cervicalgia: Secondary | ICD-10-CM | POA: Insufficient documentation

## 2022-05-25 DIAGNOSIS — M2550 Pain in unspecified joint: Secondary | ICD-10-CM

## 2022-05-25 DIAGNOSIS — M6281 Muscle weakness (generalized): Secondary | ICD-10-CM | POA: Insufficient documentation

## 2022-05-25 DIAGNOSIS — M79605 Pain in left leg: Secondary | ICD-10-CM

## 2022-05-25 DIAGNOSIS — G4719 Other hypersomnia: Secondary | ICD-10-CM

## 2022-05-25 DIAGNOSIS — M5459 Other low back pain: Secondary | ICD-10-CM | POA: Insufficient documentation

## 2022-05-25 DIAGNOSIS — R293 Abnormal posture: Secondary | ICD-10-CM | POA: Insufficient documentation

## 2022-05-25 DIAGNOSIS — R5383 Other fatigue: Secondary | ICD-10-CM | POA: Insufficient documentation

## 2022-05-25 DIAGNOSIS — R42 Dizziness and giddiness: Secondary | ICD-10-CM | POA: Insufficient documentation

## 2022-05-25 DIAGNOSIS — R768 Other specified abnormal immunological findings in serum: Secondary | ICD-10-CM | POA: Insufficient documentation

## 2022-05-25 MED ORDER — DULOXETINE HCL 30 MG PO CPEP: 30.0000 mg | ORAL_CAPSULE | Freq: Every day | ORAL | 2 refills | Status: DC

## 2022-05-25 NOTE — Patient Instructions (Signed)
I recommend checking out the [facility] patient-centered guide for fibromyalgia and chronic pain management: [email or web address]   Myofascial Pain Syndrome and Fibromyalgia Myofascial pain syndrome and fibromyalgia are both pain disorders. You may feel this pain mainly in your muscles. Myofascial pain syndrome: Always has tender points in the muscles that will cause pain when pressed (trigger points). The pain may come and go. Usually affects your neck, upper back, and shoulder areas. The pain often moves into your arms and hands. Fibromyalgia: Has muscle pains and tenderness that come and go. Is often associated with tiredness (fatigue) and sleep problems. Has trigger points. Tends to be long-lasting (chronic), but is not life-threatening. Fibromyalgia and myofascial pain syndrome are not the same. However, they often occur together. If you have both conditions, each can make the other worse. Both are common and can cause enough pain and fatigue to make day-to-day activities difficult. Both can be hard to diagnose because their symptoms are common in many other conditions. What are the causes? The exact causes of these conditions are not known. What increases the risk? You are more likely to develop either of these conditions if: You have a family history of the condition. You are female. You have certain triggers, such as: Spine disorders. An injury (trauma) or other physical stressors. Being under a lot of stress. Medical conditions such as osteoarthritis, rheumatoid arthritis, or lupus. What are the signs or symptoms? Fibromyalgia The main symptom of fibromyalgia is widespread pain and tenderness in your muscles. Pain is sometimes described as stabbing, shooting, or burning. You may also have: Tingling or numbness. Sleep problems and fatigue. Problems with attention and concentration (fibro fog). Other symptoms may include: Bowel and bladder  problems. Headaches. Vision problems. Sensitivity to odors and noises. Depression or mood changes. Painful menstrual periods (dysmenorrhea). Dry skin or eyes. These symptoms can vary over time. Myofascial pain syndrome Symptoms of myofascial pain syndrome include: Tight, ropy bands of muscle. Uncomfortable sensations in muscle areas. These may include aching, cramping, burning, numbness, tingling, and weakness. Difficulty moving certain parts of the body freely (poor range of motion). How is this diagnosed? This condition may be diagnosed by your symptoms and medical history. You will also have a physical exam. In general: Fibromyalgia is diagnosed if you have pain, fatigue, and other symptoms for more than 3 months, and symptoms cannot be explained by another condition. Myofascial pain syndrome is diagnosed if you have trigger points in your muscles, and those trigger points are tender and cause pain elsewhere in your body (referred pain). How is this treated? Treatment for these conditions depends on the type that you have. For fibromyalgia a healthy lifestyle is the most important treatment including aerobic and strength exercises. Different types of medicines are used to help treat pain and include: NSAIDs. Medicines for treating depression. Medicines that help control seizures. Medicines that relax the muscles. Treatment for myofascial pain syndrome includes: Pain medicines, such as NSAIDs. Cooling and stretching of muscles. Massage therapy with myofascial release technique. Trigger point injections. Treating these conditions often requires a team of health care providers. These may include: Your primary care provider. A physical therapist. Complementary health care providers, such as massage therapists or acupuncturists. A psychiatrist for cognitive behavioral therapy. Follow these instructions at home: Medicines Take over-the-counter and prescription medicines only as told  by your health care provider. Ask your health care provider if the medicine prescribed to you: Requires you to avoid driving or using machinery. Can cause constipation. You  may need to take these actions to prevent or treat constipation: Drink enough fluid to keep your urine pale yellow. Take over-the-counter or prescription medicines. Eat foods that are high in fiber, such as beans, whole grains, and fresh fruits and vegetables. Limit foods that are high in fat and processed sugars, such as fried or sweet foods. Lifestyle  Do exercises as told by your health care provider or physical therapist. Practice relaxation techniques to control your stress. You may want to try: Biofeedback. Visual imagery. Hypnosis. Muscle relaxation. Yoga. Meditation. Maintain a healthy lifestyle. This includes eating a healthy diet and getting enough sleep. Do not use any products that contain nicotine or tobacco. These products include cigarettes, chewing tobacco, and vaping devices, such as e-cigarettes. If you need help quitting, ask your health care provider. General instructions Talk to your health care provider about complementary treatments, such as acupuncture or massage. Do not do activities that stress or strain your muscles. This includes repetitive motions and heavy lifting. Keep all follow-up visits. This is important. Where to find support Consider joining a support group with others who are diagnosed with this condition. National Fibromyalgia Association: www.fmaware.org Where to find more information American Chronic Pain Association: www.theacpa.org Contact a health care provider if: You have new symptoms. Your symptoms get worse or your pain is severe. You have side effects from your medicines. You have trouble sleeping. Your condition is causing depression or anxiety. Get help right away if: You have thoughts of hurting yourself or others. Get help right awayif you feel like you may  hurt yourself or others, or have thoughts about taking your own life. Go to your nearest emergency room or: Call 911. Call the [facility] at [phone] or 988. This is open 24 hours a day. Text the Crisis Text Line at 360-697-0942. Summary Myofascial pain syndrome and fibromyalgia are pain disorders. Myofascial pain syndrome has tender points in the muscles that will cause pain when pressed (trigger points). Fibromyalgia also has muscle pains and tenderness that come and go, but this condition is often associated with fatigue and sleep disturbances. Fibromyalgia and myofascial pain syndrome are not the same but often occur together, causing pain and fatigue that make day-to-day activities difficult. Follow your health care provider's instructions for taking medicines and maintaining a healthy lifestyle. This information is not intended to replace advice given to you by your health care provider. Make sure you discuss any questions you have with your health care provider. Document Revised: 02/25/2021 Document Reviewed: 02/25/2021 Elsevier Patient Education  [place].

## 2022-05-26 ENCOUNTER — Encounter (HOSPITAL_BASED_OUTPATIENT_CLINIC_OR_DEPARTMENT_OTHER): Payer: Self-pay | Admitting: Physical Therapy

## 2022-05-26 ENCOUNTER — Inpatient Hospital Stay: Payer: Commercial Managed Care - HMO | Attending: Adult Health | Admitting: Adult Health

## 2022-05-26 ENCOUNTER — Other Ambulatory Visit: Payer: Self-pay

## 2022-05-26 ENCOUNTER — Ambulatory Visit (HOSPITAL_BASED_OUTPATIENT_CLINIC_OR_DEPARTMENT_OTHER): Payer: Commercial Managed Care - HMO | Admitting: Physical Therapy

## 2022-05-26 VITALS — BP 104/87 | HR 73 | Temp 97.9°F | Resp 16 | Ht 63.0 in | Wt 124.3 lb

## 2022-05-26 DIAGNOSIS — Z923 Personal history of irradiation: Secondary | ICD-10-CM | POA: Insufficient documentation

## 2022-05-26 DIAGNOSIS — F419 Anxiety disorder, unspecified: Secondary | ICD-10-CM | POA: Diagnosis not present

## 2022-05-26 DIAGNOSIS — R293 Abnormal posture: Secondary | ICD-10-CM

## 2022-05-26 DIAGNOSIS — Z87891 Personal history of nicotine dependence: Secondary | ICD-10-CM | POA: Diagnosis not present

## 2022-05-26 DIAGNOSIS — Z9013 Acquired absence of bilateral breasts and nipples: Secondary | ICD-10-CM | POA: Insufficient documentation

## 2022-05-26 DIAGNOSIS — Z170 Estrogen receptor positive status [ER+]: Secondary | ICD-10-CM

## 2022-05-26 DIAGNOSIS — M542 Cervicalgia: Secondary | ICD-10-CM

## 2022-05-26 DIAGNOSIS — Z853 Personal history of malignant neoplasm of breast: Secondary | ICD-10-CM | POA: Diagnosis present

## 2022-05-26 DIAGNOSIS — C50311 Malignant neoplasm of lower-inner quadrant of right female breast: Secondary | ICD-10-CM | POA: Diagnosis not present

## 2022-05-26 DIAGNOSIS — M5459 Other low back pain: Secondary | ICD-10-CM

## 2022-05-26 DIAGNOSIS — M6281 Muscle weakness (generalized): Secondary | ICD-10-CM

## 2022-05-26 DIAGNOSIS — Z1502 Genetic susceptibility to malignant neoplasm of ovary: Secondary | ICD-10-CM | POA: Diagnosis not present

## 2022-05-26 LAB — SURGICAL PATHOLOGY

## 2022-05-26 MED ORDER — LORAZEPAM 0.5 MG PO TABS: 0.5000 mg | ORAL_TABLET | Freq: Every day | ORAL | 0 refills | Status: DC | PRN

## 2022-05-26 NOTE — Telephone Encounter (Signed)
I will discuss with patient during follow-up visit on June 13, 2022.  She has had extensive workup done so far.

## 2022-05-26 NOTE — Progress Notes (Signed)
[facility] Follow up:    Judith Koch, MD [place] [address]   DIAGNOSIS:  Cancer Staging  Malignant neoplasm of lower-inner quadrant of right breast of female, estrogen receptor positive Staging form: Breast, AJCC 7th Edition - Clinical: Stage IA (T1c, N0, M0) - Signed by Chauncey Cruel, MD on 03/30/2015  Recurrent cancer of right breast Staging form: Breast, AJCC 7th Edition - Clinical: Stage IA (T1b, N0, M0) - Signed by Chauncey Cruel, MD on 03/30/2015   SUMMARY OF ONCOLOGIC HISTORY: (1) status post right breast lower inner quadrant biopsy in September 2012 for a clinically 2.0 cm invasive ductal carcinoma, with some enlarged Right axillary lymph nodes, the largest one of which was negative by biopsy. The tumor was grade 2, estrogen receptor positive at 88%, progesterone receptor positive at 11%, HER-2/neu positive by CISH with a ratio of 4.61, and a proliferation marker of 36%.    (2) treated in the neoadjuvant setting with Q 3 week docetaxel/ carboplatin/ trastuzumab x6, completed 05/18/2011.  Continued on trastuzumab every 3 weeks to complete one year, last dose in November 2013.   (3) s/p bilateral mastectomies with sentinel node biopsy 06/29/2011 for a residual right-sided ypT1c ypN0 invasive ductal carcinoma, grade 2, with immediate expander placement   (4) on tamoxifen as of May 2013, discontinued in early 2015 due to breast cancer recurrence    RECURRENT BREAST CANCER January 2015 (5) status post right lumpectomy with right-sided sentinel lymph node sampling 05/05/2013 for a pT1b cN0, stage IA invasive ductal carcinoma, estrogen receptor 94% positive, progesterone receptor 11% positive, with an MIB-1 of 19% and HER-2 amplification   (6)  Received TDM-1 every 3 weeks x 8 doses completed 10/09/2013; echo 08/25/2013 showed a well preserved ejection fraction   (7) adjuvant radiation therapy completed 01/08/2014   (8)  started anastrozole 04/10/2014              (a) [place] and estradiol in menopausal range 02/04/2014, to be followed every 3 months             (b) bone density 01/15/2013 showed a T score of -1.1             (c) switched to letrozole 07/07/2014, discontinued June 2016 because of arthralgias/myalgias             (d) started exemestane September 2016, stopped March 2017 because of cost issues             (e) bone density 06/17/2015 showed a T score of -1.5              (f) anastrozole resumed April 2017, discontinued by the patient sometime around April 2018   (9) genetic testing (23 genes associated with hereditary ovarian cancer through [employer], April 2015) 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  There were no deleterious mutations noted  CURRENT THERAPY: Observation  INTERVAL HISTORY: [name] 52 y.o. female returns for follow-up and discussion of her previous MRI and endometrial biopsy.  [name] is very concerned about her previous MRI.  She notes that there is some inconsistencies between the MRI from 2021 versus the MRI from 2024 and is confused and wants more explanation about these results and the inconsistencies that she has noticed.  She is also fearful about risk of endometrial cancer due to increased thickening in the lining of her uterus leading to the recommendation and completion  of endometrial biopsy that occurred on Wednesday, February 14.  She is tearful and wants some help understanding some of the words used in the reports.  She tells me that she is anxious and previously she was on lorazepam 0.5 mg daily as needed however she took this on a very rare occasion.   Patient Active Problem List   Diagnosis Date Noted   Constipation 05/25/2022   Other fatigue 05/25/2022   Dizziness 05/25/2022   Endometrial thickening on ultrasound 05/24/2022   Abnormal laboratory test result 04/25/2022   Snoring 07/25/2021   Excessive daytime  sleepiness 07/25/2021   Routine general medical examination at a health care facility 07/08/2021   Positive ANA (antinuclear antibody) 03/18/2021   Left leg pain 03/18/2021   Arthralgia 03/11/2021   Back pain 02/26/2021   Neck pain 02/26/2021   Gastroesophageal reflux disease 11/20/2019   Major depressive disorder, in remission 03/12/2017   Liver masses 12/06/2016   Recurrent cancer of right breast 12/22/2013   Headache 06/12/2013   Thrombocytopenia 05/22/2013   Malignant neoplasm of lower-inner quadrant of right breast of female, estrogen receptor positive 2012    is allergic to tussionex pennkinetic er [hydrocod poli-chlorphe poli er], morphine, morphine and related, penicillin g, and penicillins.  MEDICAL HISTORY: Past Medical History:  Diagnosis Date   Abdominal pain, epigastric 11/20/2019   Asthma    as a child   Cervical dysplasia    Gastroesophageal reflux disease 11/20/2019   Genetic testing 02/01/2018   Genetic testing was performed in 2015. The [employer] OvaNext panel was ordered. In total, 23 genes were analyzed as part of this panel: ATM, BARD1, BRCA1, BRCA2, BRIP1, CDH1, CHEK2, EPCAM, MLH1, MRE11A, MSH2, MSH6, MUTYH, NBN, NF1, PALB2, PMS2, PTEN, RAD50, RAD51C, RAD51D, STK11, and TP53. No pathogenic variants were detected. Genetic testing did detect a Variant of Unknown Significance (VUS) at the t   GERD (gastroesophageal reflux disease)    had during chemo   Headache 06/12/2013   History of chemotherapy    History of kidney stones    History of radiation therapy 11/24/13- 01/08/14   reconstructed right breast/chest wall 4500 cGy 25 sessions, electron beam boost 1440 cGy 8 sessions   Liver masses 12/06/2016   Major depressive disorder, in remission 03/12/2017   Malignant neoplasm of lower-inner quadrant of right breast of female, estrogen receptor positive 2012   Pneumonia    PONV (postoperative nausea and vomiting)    Recurrent cancer of right breast 12/22/2013    Thrombocytopenia 05/22/2013    SURGICAL HISTORY: Past Surgical History:  Procedure Laterality Date   ADENOIDECTOMY     BREAST LUMPECTOMY WITH NEEDLE LOCALIZATION Right 05/05/2013   Procedure: EXCISION RECURRENT CANCER RIGHT BREAST WITH NEEDLE LOCALOZATION;  Surgeon: Adin Hector, MD;  Location: [facility];  Service: General;  Laterality: Right;   BREAST RECONSTRUCTION  06/29/2011   Procedure: BREAST RECONSTRUCTION;  Surgeon: Theodoro Kos, DO;  Location: [facility];  Service: Plastics;  Laterality: Bilateral;  Immediate Bilateral Breast Reconstruction with Bilateral Tissue Expanders and placement of  Alloderm    DILATION AND CURETTAGE OF UTERUS     fibroid tumor surgery     lazy eye     corrective surgery   MASTECTOMY W/ SENTINEL NODE BIOPSY  06/29/2011   Procedure: MASTECTOMY WITH SENTINEL LYMPH NODE BIOPSY;  Surgeon: Adin Hector, MD;  Location: [facility];  Service: General;  Laterality: Right;  right skin spairing mstectomy and right sentinel lymph node biopsy   PORT-A-CATH REMOVAL  03/12/2012  Procedure: MINOR REMOVAL PORT-A-CATH;  Surgeon: Adin Hector, MD;  Location: [facility];  Service: General;  Laterality: N/A;  Upper left   PORTACATH PLACEMENT Left 05/05/2013   Procedure: INSERTION PORT-A-CATH;  Surgeon: Adin Hector, MD;  Location: [facility];  Service: General;  Laterality: Left;   RHINOPLASTY     uterine cervical treatments  2002   for precancerous lesions    SOCIAL HISTORY: Social History   Socioeconomic History   Marital status: Married    Spouse name: Not on file   Number of children: Not on file   Years of education: 10   Highest education level: GED or equivalent  Occupational History   Not on file  Tobacco Use   Smoking status: Former    Packs/day: 1.00    Years: 4.00    Total pack years: 4.00    Types: Cigarettes    Quit date: 2000    Years since quitting: 24.1    Passive exposure: Past   Smokeless tobacco: Never  Vaping Use   Vaping Use:  Never used  Substance and Sexual Activity   Alcohol use: Not Currently    Alcohol/week: 0.0 standard drinks of alcohol   Drug use: No   Sexual activity: Yes    Partners: Male  Other Topics Concern   Not on file  Social History Narrative   She was adopted by her grandparents as a child.  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  She works in a Engineer, agricultural.right handed   Two story home    Drinks caffeine occasional   Right handed   Social Determinants of Health   Financial Resource Strain: Not on file  Food Insecurity: Not on file  Transportation Needs: Not on file  Physical Activity: Not on file  Stress: Not on file  Social Connections: Not on file  Intimate Partner Violence: Not on file    FAMILY HISTORY: Family History  Adopted: Yes  Problem Relation Age of Onset   Colon cancer Mother 18   Rectal cancer Mother    Cervical cancer Mother    Heart attack Father    Cancer Maternal Aunt 60       stomach and ovarian as separate primaries   Cancer Maternal Aunt 39       breast   Stomach cancer Maternal Aunt    Cancer Maternal Uncle 65       lung; smoker   Cancer Maternal Uncle 63       prostate   Cancer Maternal Grandmother 75       enodometrial   Cancer Cousin 42       melanoma; mat first cousin, located on neck    Review of Systems  Constitutional:  Negative for appetite change, chills, fatigue, fever and unexpected weight change.  HENT:   Negative for hearing loss, lump/mass and trouble swallowing.   Eyes:  Negative for eye problems and icterus.  Respiratory:  Negative for chest tightness, cough and shortness of breath.   Cardiovascular:  Negative for chest pain, leg swelling and palpitations.  Gastrointestinal:  Negative for abdominal distention, abdominal pain, constipation, diarrhea, nausea and vomiting.  Endocrine: Negative for hot flashes.  Genitourinary:  Negative for difficulty urinating.   Musculoskeletal:  Positive for  back pain. Negative for arthralgias.  Skin:  Negative for itching and rash.  Neurological:  Negative for dizziness, extremity weakness, headaches and numbness.  Hematological:  Negative for adenopathy. Does not  bruise/bleed easily.  Psychiatric/Behavioral:  Negative for depression. The patient is nervous/anxious.       PHYSICAL EXAMINATION  ECOG PERFORMANCE STATUS: 1 - Symptomatic but completely ambulatory  Vitals:   05/26/22 1425  BP: 104/87  Pulse: 73  Resp: 16  Temp: 97.9 F (36.6 C)  SpO2: 100%   This entire visit was discussion only.  I let her know that I would examine her when she came back for her regular follow-up with me in July.  LABORATORY DATA:  None for this visit   ASSESSMENT and THERAPY PLAN:   Malignant neoplasm of lower-inner quadrant of right breast of female, estrogen receptor positive [name] is a 52 year old woman with h/o ER positive breast cancer diagnosed in 2012 s/p bilateral mastectomies chemotherapy, and tamoxifen.  She developed recurrence in 2015, stage IA triple positive breast cancer s/p TDM1, adjuvant radiation, and unable to tolerate antiestrogen therapy.    [name] is here for follow-up due to her anxiety related to her recent MRI results and endometrial biopsy.  During her visit thankfully the endometrial biopsy resulted and I was able to give her those results which indicated scant atrophic endometrial epithelium.  She was very grateful to have these results.  I spent a good deal of time reviewing her MRI results with her and the comparisons and differences.  I let [name] know that I agree with the recommendations from Dr. Silverio Decamp about repeating MRI in 1 year.  [name] remains anxious about these results and I have reached out to one of my colleagues in radiology to discuss her concerns with.  Since her endometrial biopsy was negative for malignancy I recommended that she follow-up with Dr. Jodi Mourning to see if due to the thickness she needs to  have any additional procedures or surgeries.  [name] is very anxious and I sent in lorazepam 0.5 mg daily as needed #15. he has been on these before and she reassured me that she uses them only very intermittently.  [name] will return in July for labs and follow-up.  She knows to call for any questions or concerns that may arise in the meantime.    All questions were answered. The patient knows to call the clinic with any problems, questions or concerns. We can certainly see the patient much sooner if necessary.  Total encounter time:45 minutes*in face-to-face visit time, chart review, lab review, care coordination, order entry, and documentation of the encounter time.    Wilber Bihari, NP 05/26/22 4:28 PM Medical Oncology and Hematology [facility] [place], [place] [address] Tel. [phone]    Fax. [phone]  *Total Encounter Time as defined by the Centers for Medicare and Medicaid Services includes, in addition to the face-to-face time of a patient visit (documented in the note above) non-face-to-face time: obtaining and reviewing outside history, ordering and reviewing medications, tests or procedures, care coordination (communications with other health care professionals or caregivers) and documentation in the medical record.

## 2022-05-26 NOTE — Therapy (Signed)
OUTPATIENT PHYSICAL THERAPY THORACOLUMBAR TREATMENT   Patient Name: [name] MRN: [id number] DOB:1970-05-13, 52 y.o., female Today's Date: 05/26/2022   PT End of Session - 05/26/22 1111     Visit Number 23    Number of Visits 34    Date for PT Re-Evaluation 07/07/22    Authorization Type [employer]    PT Start Time 1110    PT Stop Time 1148    PT Time Calculation (min) 38 min    Activity Tolerance Patient tolerated treatment well    Behavior During Therapy [facility] for tasks assessed/performed                         Past Medical History:  Diagnosis Date   Abdominal pain, epigastric 11/20/2019   Asthma    as a child   Cervical dysplasia    Gastroesophageal reflux disease 11/20/2019   Genetic testing 02/01/2018   Genetic testing was performed in 2015. The [employer] OvaNext panel was ordered. In total, 23 genes were analyzed as part of this panel: ATM, BARD1, BRCA1, BRCA2, BRIP1, CDH1, CHEK2, EPCAM, MLH1, MRE11A, MSH2, MSH6, MUTYH, NBN, NF1, PALB2, PMS2, PTEN, RAD50, RAD51C, RAD51D, STK11, and TP53. No pathogenic variants were detected. Genetic testing did detect a Variant of Unknown Significance (VUS) at the t   GERD (gastroesophageal reflux disease)    had during chemo   Headache 06/12/2013   History of chemotherapy    History of kidney stones    History of radiation therapy 11/24/13- 01/08/14   reconstructed right breast/chest wall 4500 cGy 25 sessions, electron beam boost 1440 cGy 8 sessions   Liver masses 12/06/2016   Major depressive disorder, in remission 03/12/2017   Malignant neoplasm of lower-inner quadrant of right breast of female, estrogen receptor positive 2012   Pneumonia    PONV (postoperative nausea and vomiting)    Recurrent cancer of right breast 12/22/2013   Thrombocytopenia 05/22/2013   Past Surgical History:  Procedure Laterality Date   ADENOIDECTOMY     BREAST LUMPECTOMY WITH NEEDLE LOCALIZATION Right 05/05/2013   Procedure: EXCISION RECURRENT  CANCER RIGHT BREAST WITH NEEDLE LOCALOZATION;  Surgeon: Adin Hector, MD;  Location: [facility];  Service: General;  Laterality: Right;   BREAST RECONSTRUCTION  06/29/2011   Procedure: BREAST RECONSTRUCTION;  Surgeon: Theodoro Kos, DO;  Location: [facility];  Service: Plastics;  Laterality: Bilateral;  Immediate Bilateral Breast Reconstruction with Bilateral Tissue Expanders and placement of  Alloderm    DILATION AND CURETTAGE OF UTERUS     fibroid tumor surgery     lazy eye     corrective surgery   MASTECTOMY W/ SENTINEL NODE BIOPSY  06/29/2011   Procedure: MASTECTOMY WITH SENTINEL LYMPH NODE BIOPSY;  Surgeon: Adin Hector, MD;  Location: [facility];  Service: General;  Laterality: Right;  right skin spairing mstectomy and right sentinel lymph node biopsy   PORT-A-CATH REMOVAL  03/12/2012   Procedure: MINOR REMOVAL PORT-A-CATH;  Surgeon: Adin Hector, MD;  Location: [facility];  Service: General;  Laterality: N/A;  Upper left   PORTACATH PLACEMENT Left 05/05/2013   Procedure: INSERTION PORT-A-CATH;  Surgeon: Adin Hector, MD;  Location: [facility];  Service: General;  Laterality: Left;   RHINOPLASTY     uterine cervical treatments  2002   for precancerous lesions   Patient Active Problem List   Diagnosis Date Noted   Constipation 05/25/2022   Other fatigue 05/25/2022   Dizziness 05/25/2022  Endometrial thickening on ultrasound 05/24/2022   Abnormal laboratory test result 04/25/2022   Snoring 07/25/2021   Excessive daytime sleepiness 07/25/2021   Routine general medical examination at a health care facility 07/08/2021   Positive ANA (antinuclear antibody) 03/18/2021   Left leg pain 03/18/2021   Arthralgia 03/11/2021   Back pain 02/26/2021   Neck pain 02/26/2021   Gastroesophageal reflux disease 11/20/2019   Major depressive disorder, in remission 03/12/2017   Liver masses 12/06/2016   Recurrent cancer of right breast 12/22/2013   Headache 06/12/2013   Thrombocytopenia  05/22/2013   Malignant neoplasm of lower-inner quadrant of right breast of female, estrogen receptor positive 2012     REFERRING PROVIDER: Gardenia Phlegm, NP  REFERRING DIAG:  C50.311,Z17.0 (ICD-10-CM) - Malignant neoplasm of lower-inner quadrant of right breast of female, estrogen receptor positive ([place])  M54.50,G89.29 (ICD-10-CM) - Chronic midline low back pain without sciatica    Rationale for Evaluation and Treatment Rehabilitation  THERAPY DIAG:  Cervicalgia  Muscle weakness (generalized)  Abnormal posture  Other low back pain  ONSET DATE: chronic  SUBJECTIVE:                                                                                                                                                                                           SUBJECTIVE STATEMENT:  Biopsy was terrible. Have appt with oncology today.  I think my back has gotten better but after the biopsy my legs just hurt! The deep muscle ache.   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 Yes: NPRS scale: 5/10 Pain location: low back, neck Pain description: sore Aggravating factors: constant pain Relieving factors: excedrin tension   PRECAUTIONS: None  WEIGHT BEARING RESTRICTIONS No  FALLS:  Has patient fallen in last 6 months? No  PLOF: Independent  PATIENT GOALS decrease pain   OBJECTIVE:   DIAGNOSTIC FINDINGS:  Oct 2022 MRI- hemangioma at L4, fatty infiltration to sacrum.    TODAY'S TREATMENT   Treatment                            05/26/22:  See plan    Treatment                            05/10/22:  Reviewed goals and discussed plan of care Bent over rows 10 pounds each hand Deadlifts with work on forearm and education for proper hip hinge Reviewed importance of exercise frequency   Treatment  04/26/22:  Squat to table tap 10lb kettle bell 2x10 with blue TB at knees T/s  paloff rotation 10lbs 10x each way Bird dog row 10lbs 2x10 RDL 2x10 10lbs blue TB at knees     PATIENT EDUCATION:  Education details: walking recommendations, anatomy, exercise progression, gym safety, HEP, POC  Person educated: Patient Education method: Explanation, Demonstration, Tactile cues, Verbal cues, and Handouts Education comprehension: verbalized understanding, returned demonstration, verbal cues required, tactile cues required, and needs further education   HOME EXERCISE PROGRAM: Access Code: [email or web address] URL: [email or web address]   ASSESSMENT:  CLINICAL IMPRESSION:  Entirety of appointment today spent discussing symptoms from musculoskeletal system as well as internal organs.  Patient had concerns regarding findings and her MRI and is still waiting to hear results from cervical biopsy.  Soreness noted in the bilateral lower extremities following biopsy but seems to have decreased and almost released some tension per patient reports notes a family history of chronic constipation and back pain associated so we discussed use of nutrition to provide support to a healthy internal system.  Patient will focus on healthy foods such as leafy greens and consider adding prune juice or something similar without added sugar so that she is not using laxatives as they warned against taking for greater than 7 days at a time.  She is seeing her oncologist later today and was encouraged to discuss concerns and make sure there is not something else that needs to be followed up on.  OBJECTIVE IMPAIRMENTS decreased activity tolerance, difficulty walking, increased muscle spasms, postural dysfunction, and pain.   ACTIVITY LIMITATIONS carrying, lifting, bending, sitting, standing, squatting, sleeping, stairs, and locomotion level  PARTICIPATION LIMITATIONS: meal prep, cleaning, laundry, driving, shopping, and community activity  PERSONAL FACTORS 1-2 comorbidities: benign growth found  in lumbar spine, chronic pain, known LLD  are also affecting patient's functional outcome.      GOALS:  SHORT TERM GOALS: Target date:12/06/21  Will determine need for heel lift Baseline: Goal status: achieved   LONG TERM GOALS: Target date: 04/10/2022   Bil hip abd strength to meet age appropriate levels per hand held dynamometry Baseline: Lt: 24, 26, 29 lb   Rt: 66 30 28; age appropriate is 45lb Goal status: Ongoing  2.  Able to demonstrate proper form in long term core strength Baseline:  Goal status: Ongoing  3.  Pt will be able to tolerate sitting or standing for 30 minute periods without increase in pain Baseline:  Goal status: partially met.  4. Pt will be able to demonstrate/report ability to sit/stand/sleep for extended periods of time without pain in order to demonstrate functional improvement and tolerance to static positioning.  Goal Status: Patient reports she feels better but still continues to feel discomfort across her lower back in static positions  5. Pt will be able to lift/squat/hold >20 lbs in order to demonstrate functional improvement in lumbopelvic and UE strength for return to ADL/household duties.  Goal Status: achieved       PLAN: PT FREQUENCY: Every other week PT DURATION: 8 weeks  PLANNED INTERVENTIONS: Therapeutic exercises, Therapeutic activity, Neuromuscular re-education, Balance training, Gait training, Patient/Family education, Self Care, Joint mobilization, Stair training, Aquatic Therapy, Spinal mobilization, Cryotherapy, Moist heat, Taping, Manual therapy, and Re-evaluation.  PLAN FOR NEXT SESSION: continue HEP; build out gym/weight program, continue to educate on proper hip hinge form, plank progressions for core stability  Earlyn [place] C. Kammy Klett PT, DPT 05/26/22 2:23 PM

## 2022-05-26 NOTE — Assessment & Plan Note (Signed)
[name] is a 52 year old woman with h/o ER positive breast cancer diagnosed in 2012 s/p bilateral mastectomies chemotherapy, and tamoxifen.  She developed recurrence in 2015, stage IA triple positive breast cancer s/p TDM1, adjuvant radiation, and unable to tolerate antiestrogen therapy.    [name] is here for follow-up due to her anxiety related to her recent MRI results and endometrial biopsy.  During her visit thankfully the endometrial biopsy resulted and I was able to give her those results which indicated scant atrophic endometrial epithelium.  She was very grateful to have these results.  I spent a good deal of time reviewing her MRI results with her and the comparisons and differences.  I let [name] know that I agree with the recommendations from Dr. Silverio Decamp about repeating MRI in 1 year.  [name] remains anxious about these results and I have reached out to one of my colleagues in radiology to discuss her concerns with.  Since her endometrial biopsy was negative for malignancy I recommended that she follow-up with Dr. Jodi Mourning to see if due to the thickness she needs to have any additional procedures or surgeries.  [name] is very anxious and I sent in lorazepam 0.5 mg daily as needed #15. he has been on these before and she reassured me that she uses them only very intermittently.  [name] will return in July for labs and follow-up.  She knows to call for any questions or concerns that may arise in the meantime.

## 2022-05-30 ENCOUNTER — Other Ambulatory Visit: Payer: BLUE CROSS/BLUE SHIELD | Admitting: Obstetrics

## 2022-05-30 ENCOUNTER — Other Ambulatory Visit: Payer: BLUE CROSS/BLUE SHIELD | Admitting: Obstetrics and Gynecology

## 2022-05-31 ENCOUNTER — Encounter (HOSPITAL_BASED_OUTPATIENT_CLINIC_OR_DEPARTMENT_OTHER): Payer: Commercial Managed Care - HMO | Admitting: Physical Therapy

## 2022-06-07 ENCOUNTER — Ambulatory Visit (HOSPITAL_BASED_OUTPATIENT_CLINIC_OR_DEPARTMENT_OTHER): Payer: Commercial Managed Care - HMO | Admitting: Physical Therapy

## 2022-06-14 NOTE — Progress Notes (Signed)
[name]    [id number]    Sep 02, 1970  Primary Care Physician:Crawford, Real Cons, MD  Referring Physician: Hoyt Koch, MD [address] [place],  [place] [address]   Chief complaint:  Chief Complaint  Patient presents with   Constipation    Pt states still has issues moving her bowels. Also would like to discuss her MRI results.     HPI: 52 year old pleasant female with remote history of breast CA X2 status post bilateral mastectomy and chemotherapy in remission with c/o persistent epigastric pain radiating to the back.  She is worried about cancer recurrence. She had liver lesion noted on CT few years ago and is requesting over read on recent CT abd & pelvis  I last saw her on 05/05/22. At that time she didn't feel completely evacuated. She was on Linzess 145 mcg daily + senna 2 at bedtime. She reported having a BM once every few days.  Today, reports feeling unchanged since her last visit. She complains of worsening constipation with bowel movement once every 1 to 2 weeks, only after she takes over-the-counter stool softeners and MiraLAX.  She does not have a bowel movement if she does not take any laxatives. She states she typically has a BM once a week. She reports feeling her constipation in her back and is accompanied by some pressure.    She reports stopping Linzess 145 mg due to high out-of-pocket expense. She reports when taking Linzess and Protonix she typically experienced abdominal discomfort/pain. She states that when taking Linzess she experienced diarrhea that lasted for 3 months. She reports taking digestive enzymes and turmeric.    We discussed her MRI results. We reassured the patient that the MRI results were normal and there were no remarkable findings but she continues to worry about the liver findings, benign-appearing hypervascular lesions likely hemangiomas despite multiple reassurances   GI Hx:  MR Abdomen W WO Contrast  05/23/22 1. Hypervascular lesions in the liver not substantially changed since 2021 favor benign or indolent process such as vascular shunt lesions, FNH or hepatic adenomata. 2. No additional focal hepatic lesions. 3. Small amount of sludge in the gallbladder. 4. 3 mm T2 bright lesion in the head of the pancreas not substantially changed compared to more remote imaging dating back to 2021. This may represent a small side branch intraductal papillary mucinous neoplasm. Attention on follow-up. Would also consider 1 year follow-up with MRI/MRCP document stability  TRANSABDOMINAL AND TRANSVAGINAL ULTRASOUND OF PELVIS  05/04/22 Thickened endometrial complex 9 mm thick containing tiny cystic areas, nonspecific, could be related to Tamoxifen but cannot exclude malignancy; endometrial sampling should be considered to exclude carcinoma.  CT Abdomen and Pelvis w contrast 04/27/22 1. No acute abnormality identified in the abdomen or pelvis. 2. Moderate to large volume of formed stool throughout the colon suggestive of constipation. 3. Nonobstructive 5 mm left lower pole renal calculus.   US Abdomen 03/08/22   1. No acute sonographic findings in the abdomen. 2. 7 mm nonobstructing left renal calculus  CT abd & pelvis 03/21/22 1. No acute abnormality identified in the abdomen or pelvis. 2. Moderate to large volume of formed stool throughout the colon suggestive of constipation. 3. Nonobstructive 5 mm left lower pole renal calculus.   EGD 02/22/22 Impression:       - Z-line regular, 36 cm from the incisors.                           -  No gross lesions in the entire esophagus.                           - Normal stomach.                           - Normal first portion of the duodenum and second                            portion of the duodenum. Biopsied.  Colonoscopy 02/22/22 Impression:       - Non-bleeding internal hemorrhoids.                           - The examination was otherwise  normal.  Colonoscopy 12/12/17  - Non-bleeding internal hemorrhoids.  - The examination was otherwise normal.  - No specimens collected.  EGD December 12, 2017  Showed severe gastritis with superficial antral ulcers and duodenal ulcers.  Gastric biopsies negative for H. Pylori Colonoscopy December 12, 2017: Showed internal hemorrhoids otherwise normal exam   Current Outpatient Medications:    Acetaminophen-Caffeine (EXCEDRIN TENSION HEADACHE PO), Take by mouth., Disp: , Rfl:    Biotin 5000 MCG CAPS, Take 5,000 mcg by mouth daily., Disp: , Rfl:    Black Pepper-Turmeric (TURMERIC CURCUMIN) 08-998 MG CAPS, Take by mouth 2 (two) times daily., Disp: , Rfl:    Coenzyme Q10 (COQ10) 150 MG CAPS, Take 1 capsule by mouth once., Disp: , Rfl:    Digestive Enzyme CAPS, Take by mouth., Disp: , Rfl:    DULoxetine (CYMBALTA) 30 MG capsule, Take 1 capsule (30 mg total) by mouth daily., Disp: 30 capsule, Rfl: 2   LORazepam (ATIVAN) 0.5 MG tablet, Take 1 tablet (0.5 mg total) by mouth daily as needed for anxiety., Disp: 15 tablet, Rfl: 0   Prenat-Fe Poly-Methfol-FA-DHA (VITAFOL FE+) 90-0.6-0.4-200 MG CAPS, Take 1 tablet by mouth daily., Disp: 30 capsule, Rfl: 11   Probiotic Product (PROBIOTIC DAILY) CAPS, Take by mouth., Disp: , Rfl:     Allergies as of 06/19/2022 - Review Complete 06/19/2022  Allergen Reaction Noted   Tussionex pennkinetic er [hydrocod poli-chlorphe poli er] Other (See Comments) 01/12/2011   Morphine Rash 05/22/2013   Morphine and related Anxiety 01/12/2011   Penicillin g Rash 05/22/2013   Penicillins Rash 01/12/2011    Past Medical History:  Diagnosis Date   Abdominal pain, epigastric 11/20/2019   Asthma    as a child   Cervical dysplasia    Gastroesophageal reflux disease 11/20/2019   Genetic testing 02/01/2018   Genetic testing was performed in 2015. The [employer] OvaNext panel was ordered. In total, 23 genes were analyzed as part of this panel: ATM, BARD1, BRCA1, BRCA2, BRIP1,  CDH1, CHEK2, EPCAM, MLH1, MRE11A, MSH2, MSH6, MUTYH, NBN, NF1, PALB2, PMS2, PTEN, RAD50, RAD51C, RAD51D, STK11, and TP53. No pathogenic variants were detected. Genetic testing did detect a Variant of Unknown Significance (VUS) at the t   GERD (gastroesophageal reflux disease)    had during chemo   Headache 06/12/2013   History of chemotherapy    History of kidney stones    History of radiation therapy 11/24/13- 01/08/14   reconstructed right breast/chest wall 4500 cGy 25 sessions, electron beam boost 1440 cGy 8 sessions   Liver masses 12/06/2016   Major depressive disorder, in remission 03/12/2017   Malignant neoplasm of  lower-inner quadrant of right breast of female, estrogen receptor positive 2012   Pneumonia    PONV (postoperative nausea and vomiting)    Recurrent cancer of right breast 12/22/2013   Thrombocytopenia 05/22/2013    Past Surgical History:  Procedure Laterality Date   ADENOIDECTOMY     BREAST LUMPECTOMY WITH NEEDLE LOCALIZATION Right 05/05/2013   Procedure: EXCISION RECURRENT CANCER RIGHT BREAST WITH NEEDLE LOCALOZATION;  Surgeon: Adin Hector, MD;  Location: [facility];  Service: General;  Laterality: Right;   BREAST RECONSTRUCTION  06/29/2011   Procedure: BREAST RECONSTRUCTION;  Surgeon: Theodoro Kos, DO;  Location: [facility];  Service: Plastics;  Laterality: Bilateral;  Immediate Bilateral Breast Reconstruction with Bilateral Tissue Expanders and placement of  Alloderm    DILATION AND CURETTAGE OF UTERUS     fibroid tumor surgery     lazy eye     corrective surgery   MASTECTOMY W/ SENTINEL NODE BIOPSY  06/29/2011   Procedure: MASTECTOMY WITH SENTINEL LYMPH NODE BIOPSY;  Surgeon: Adin Hector, MD;  Location: [facility];  Service: General;  Laterality: Right;  right skin spairing mstectomy and right sentinel lymph node biopsy   PORT-A-CATH REMOVAL  03/12/2012   Procedure: MINOR REMOVAL PORT-A-CATH;  Surgeon: Adin Hector, MD;  Location: [facility];  Service: General;   Laterality: N/A;  Upper left   PORTACATH PLACEMENT Left 05/05/2013   Procedure: INSERTION PORT-A-CATH;  Surgeon: Adin Hector, MD;  Location: [facility];  Service: General;  Laterality: Left;   RHINOPLASTY     uterine cervical treatments  2002   for precancerous lesions    Family History  Adopted: Yes  Problem Relation Age of Onset   Colon cancer Mother 37   Rectal cancer Mother    Cervical cancer Mother    Heart attack Father    Cancer Maternal Aunt 60       stomach and ovarian as separate primaries   Cancer Maternal Aunt 55       breast   Stomach cancer Maternal Aunt    Cancer Maternal Uncle 65       lung; smoker   Cancer Maternal Uncle 63       prostate   Cancer Maternal Grandmother 75       enodometrial   Cancer Cousin 7       melanoma; mat first cousin, located on neck    Social History   Socioeconomic History   Marital status: Married    Spouse name: Not on file   Number of children: Not on file   Years of education: 10   Highest education level: GED or equivalent  Occupational History   Not on file  Tobacco Use   Smoking status: Former    Packs/day: 1.00    Years: 4.00    Total pack years: 4.00    Types: Cigarettes    Quit date: 2000    Years since quitting: 24.2    Passive exposure: Past   Smokeless tobacco: Never  Vaping Use   Vaping Use: Never used  Substance and Sexual Activity   Alcohol use: Not Currently    Alcohol/week: 0.0 standard drinks of alcohol   Drug use: No   Sexual activity: Yes    Partners: Male  Other Topics Concern   Not on file  Social History Narrative   She was adopted by her grandparents as a child.  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  Musculoskeletal:  Positive for back pain (pressure).      Physical Exam: General: well-appearing Eyes: sclera anicteric, no redness GI: soft, no tenderness, with active bowel sounds. No guarding or palpable organomegaly noted. Skin; warm and dry, no rash or jaundice noted Neuro: awake, alert and oriented x 3. Normal gross motor function and fluent speech   Data Reviewed:  Reviewed labs, radiology imaging, old records and pertinent past GI work up   Assessment and Plan/Recommendations:  52 year old female with remote history of breast cancer 2012, 2014 status post bilateral mastectomy and chemotherapy.  GERD and dyspepsia: Follow antireflux measures Use Carafate and PPI as needed, currently not taking either She is on multiple supplements including curcumin, is an anti-inflammatory could be exacerbating gastritis and GERD symptoms. She also uses digestive enzymes and probiotics, advised patient to consider discontinuing, unclear benefit to her  Celiac and alpha gal allergy negative.  Patient is worried about potential food and environmental allergies. Will refer to allergy specialist for testing followed by referral to nutritionist if needed  Benign-appearing liver cysts/hypervascular lesions likely hemangiomas.  Patient continues to worry about the findings despite multiple reassurance, will refer to hepatologist to discuss her concerns and evaluate if she needs any further workup   IBS constipation and dyssynergic defecation Refer to pelvic floor physical therapy Advised patient to start using MiraLAX 1 capful daily and adjust the dose based on response to have 1-2 soft bowel movements every day or at least every other day Increase dietary fiber  and water intake    Return as needed  This visit required >60 minutes of patient care (this includes precharting, chart review, review of results, face-to-face time used for counseling as well as treatment plan and follow-up. The patient was provided an opportunity to ask questions and all were answered. The patient agreed with the plan and demonstrated an understanding of the instructions.   I,Safa M Kadhim,acting as a scribe for Harl Bowie, MD.,have documented all relevant documentation on the behalf of Harl Bowie, MD,as directed by  Harl Bowie, MD while in the presence of Harl Bowie, MD.   I, Harl Bowie, MD, have reviewed all documentation for this visit. The documentation on 06/19/22 for the exam, diagnosis, procedures, and orders are all accurate and complete.   Damaris Hippo , MD    CC: Hoyt Koch, *

## 2022-06-19 ENCOUNTER — Ambulatory Visit: Payer: Commercial Managed Care - HMO | Admitting: Gastroenterology

## 2022-06-19 ENCOUNTER — Telehealth: Payer: Self-pay | Admitting: Gastroenterology

## 2022-06-19 ENCOUNTER — Other Ambulatory Visit: Payer: BLUE CROSS/BLUE SHIELD | Admitting: Obstetrics & Gynecology

## 2022-06-19 ENCOUNTER — Encounter: Payer: Self-pay | Admitting: Gastroenterology

## 2022-06-19 VITALS — BP 116/68 | HR 65 | Ht 63.0 in

## 2022-06-19 DIAGNOSIS — M6289 Other specified disorders of muscle: Secondary | ICD-10-CM

## 2022-06-19 DIAGNOSIS — K7689 Other specified diseases of liver: Secondary | ICD-10-CM

## 2022-06-19 DIAGNOSIS — K5902 Outlet dysfunction constipation: Secondary | ICD-10-CM | POA: Diagnosis not present

## 2022-06-19 DIAGNOSIS — Z889 Allergy status to unspecified drugs, medicaments and biological substances status: Secondary | ICD-10-CM | POA: Diagnosis not present

## 2022-06-19 DIAGNOSIS — K581 Irritable bowel syndrome with constipation: Secondary | ICD-10-CM

## 2022-06-19 NOTE — Telephone Encounter (Signed)
Inbound call from patient, states she was told during her visit, she would be referred to the [facility], however patient did not see it in her After Visit Summary. Patient is requesting to speak with a nurse in regards.

## 2022-06-19 NOTE — Patient Instructions (Signed)
We will refer you to Pelvic Floor Physical Therapy, they will contact you with those appointments  Referral to Liver clinic  Referral to Allergy  Due to recent changes in healthcare laws, you may see the results of your imaging and laboratory studies on MyChart before your provider has had a chance to review them.  We understand that in some cases there may be results that are confusing or concerning to you. Not all laboratory results come back in the same time frame and the provider may be waiting for multiple results in order to interpret others.  Please give [place] 48 hours in order for your provider to thoroughly review all the results before contacting the office for clarification of your results.    I appreciate the  opportunity to care for you  Thank You   Harl Bowie , MD

## 2022-06-19 NOTE — Telephone Encounter (Signed)
Called and left message for patient that it was mentioned in her After Visit [place] that we are referring her to Liver [facility]. Faxed all information and form today to [facility].

## 2022-06-20 NOTE — Telephone Encounter (Signed)
Patient has an appointment scheduled on 08/23/2022 at 2:15pm

## 2022-06-21 ENCOUNTER — Telehealth: Payer: Self-pay | Admitting: Gastroenterology

## 2022-06-21 ENCOUNTER — Ambulatory Visit (HOSPITAL_BASED_OUTPATIENT_CLINIC_OR_DEPARTMENT_OTHER): Payer: Commercial Managed Care - HMO | Attending: Orthopaedic Surgery | Admitting: Physical Therapy

## 2022-06-21 ENCOUNTER — Encounter (HOSPITAL_BASED_OUTPATIENT_CLINIC_OR_DEPARTMENT_OTHER): Payer: Self-pay | Admitting: Physical Therapy

## 2022-06-21 DIAGNOSIS — R293 Abnormal posture: Secondary | ICD-10-CM | POA: Insufficient documentation

## 2022-06-21 DIAGNOSIS — M6281 Muscle weakness (generalized): Secondary | ICD-10-CM | POA: Diagnosis present

## 2022-06-21 DIAGNOSIS — M542 Cervicalgia: Secondary | ICD-10-CM | POA: Diagnosis not present

## 2022-06-21 NOTE — Therapy (Signed)
OUTPATIENT PHYSICAL THERAPY THORACOLUMBAR TREATMENT   Patient Name: [name] MRN: [id number] DOB:September 09, 1970, 52 y.o., female Today's Date: 06/21/2022   PT End of Session - 06/21/22 1239     Visit Number 24    Number of Visits 34    Date for PT Re-Evaluation 07/07/22    Authorization Type [employer]    PT Start Time 1237    PT Stop Time 1315    PT Time Calculation (min) 38 min    Activity Tolerance Patient tolerated treatment well    Behavior During Therapy [facility] for tasks assessed/performed                          Past Medical History:  Diagnosis Date   Abdominal pain, epigastric 11/20/2019   Asthma    as a child   Cervical dysplasia    Gastroesophageal reflux disease 11/20/2019   Genetic testing 02/01/2018   Genetic testing was performed in 2015. The [employer] OvaNext panel was ordered. In total, 23 genes were analyzed as part of this panel: ATM, BARD1, BRCA1, BRCA2, BRIP1, CDH1, CHEK2, EPCAM, MLH1, MRE11A, MSH2, MSH6, MUTYH, NBN, NF1, PALB2, PMS2, PTEN, RAD50, RAD51C, RAD51D, STK11, and TP53. No pathogenic variants were detected. Genetic testing did detect a Variant of Unknown Significance (VUS) at the t   GERD (gastroesophageal reflux disease)    had during chemo   Headache 06/12/2013   History of chemotherapy    History of kidney stones    History of radiation therapy 11/24/13- 01/08/14   reconstructed right breast/chest wall 4500 cGy 25 sessions, electron beam boost 1440 cGy 8 sessions   Liver masses 12/06/2016   Major depressive disorder, in remission 03/12/2017   Malignant neoplasm of lower-inner quadrant of right breast of female, estrogen receptor positive 2012   Pneumonia    PONV (postoperative nausea and vomiting)    Recurrent cancer of right breast 12/22/2013   Thrombocytopenia 05/22/2013   Past Surgical History:  Procedure Laterality Date   ADENOIDECTOMY     BREAST LUMPECTOMY WITH NEEDLE LOCALIZATION Right 05/05/2013   Procedure: EXCISION RECURRENT  CANCER RIGHT BREAST WITH NEEDLE LOCALOZATION;  Surgeon: Adin Hector, MD;  Location: [facility];  Service: General;  Laterality: Right;   BREAST RECONSTRUCTION  06/29/2011   Procedure: BREAST RECONSTRUCTION;  Surgeon: Theodoro Kos, DO;  Location: [facility];  Service: Plastics;  Laterality: Bilateral;  Immediate Bilateral Breast Reconstruction with Bilateral Tissue Expanders and placement of  Alloderm    DILATION AND CURETTAGE OF UTERUS     fibroid tumor surgery     lazy eye     corrective surgery   MASTECTOMY W/ SENTINEL NODE BIOPSY  06/29/2011   Procedure: MASTECTOMY WITH SENTINEL LYMPH NODE BIOPSY;  Surgeon: Adin Hector, MD;  Location: [facility];  Service: General;  Laterality: Right;  right skin spairing mstectomy and right sentinel lymph node biopsy   PORT-A-CATH REMOVAL  03/12/2012   Procedure: MINOR REMOVAL PORT-A-CATH;  Surgeon: Adin Hector, MD;  Location: [facility];  Service: General;  Laterality: N/A;  Upper left   PORTACATH PLACEMENT Left 05/05/2013   Procedure: INSERTION PORT-A-CATH;  Surgeon: Adin Hector, MD;  Location: [facility];  Service: General;  Laterality: Left;   RHINOPLASTY     uterine cervical treatments  2002   for precancerous lesions   Patient Active Problem List   Diagnosis Date Noted   Constipation 05/25/2022   Other fatigue 05/25/2022   Dizziness  05/25/2022   Endometrial thickening on ultrasound 05/24/2022   Abnormal laboratory test result 04/25/2022   Snoring 07/25/2021   Excessive daytime sleepiness 07/25/2021   Routine general medical examination at a health care facility 07/08/2021   Positive ANA (antinuclear antibody) 03/18/2021   Left leg pain 03/18/2021   Arthralgia 03/11/2021   Back pain 02/26/2021   Neck pain 02/26/2021   Gastroesophageal reflux disease 11/20/2019   Major depressive disorder, in remission 03/12/2017   Liver masses 12/06/2016   Recurrent cancer of right breast 12/22/2013   Headache 06/12/2013   Thrombocytopenia  05/22/2013   Malignant neoplasm of lower-inner quadrant of right breast of female, estrogen receptor positive 2012     REFERRING PROVIDER: Gardenia Phlegm, NP  REFERRING DIAG:  C50.311,Z17.0 (ICD-10-CM) - Malignant neoplasm of lower-inner quadrant of right breast of female, estrogen receptor positive ([place])  M54.50,G89.29 (ICD-10-CM) - Chronic midline low back pain without sciatica    Rationale for Evaluation and Treatment Rehabilitation  THERAPY DIAG:  Cervicalgia  Muscle weakness (generalized)  Abnormal posture  ONSET DATE: chronic  SUBJECTIVE:                                                                                                                                                                                           SUBJECTIVE STATEMENT:  Right now I just feel sore all over. Exercises and stretches seem to be helping with the back pain but likely linked to chronic constipation as well.   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 Yes: NPRS scale: 5/10 Pain location: low back, neck Pain description: sore Aggravating factors: constant pain Relieving factors: excedrin tension   PRECAUTIONS: None  WEIGHT BEARING RESTRICTIONS No  FALLS:  Has patient fallen in last 6 months? No  PLOF: Independent  PATIENT GOALS decrease pain   OBJECTIVE:   DIAGNOSTIC FINDINGS:  Oct 2022 MRI- hemangioma at L4, fatty infiltration to sacrum.    TODAY'S TREATMENT   Treatment                            06/21/22:  Hip hinge 10lb each hand Plank- hand/feet & knee/elbow, side on knee/elbow- added UE horiz abd/add Child pose Bird dog with knee to elbow Goblet squats 10lb   Treatment                            05/26/22:  See plan    Treatment  05/10/22:  Reviewed goals and discussed plan of care Bent over rows 10 pounds each hand Deadlifts with work on forearm and  education for proper hip hinge Reviewed importance of exercise frequency   Treatment                            04/26/22:  Squat to table tap 10lb kettle bell 2x10 with blue TB at knees T/s paloff rotation 10lbs 10x each way Bird dog row 10lbs 2x10 RDL 2x10 10lbs blue TB at knees     PATIENT EDUCATION:  Education details: walking recommendations, anatomy, exercise progression, gym safety, HEP, POC  Person educated: Patient Education method: Explanation, Demonstration, Tactile cues, Verbal cues, and Handouts Education comprehension: verbalized understanding, returned demonstration, verbal cues required, tactile cues required, and needs further education   HOME EXERCISE PROGRAM: Access Code: [email or web address] URL: [email or web address]   ASSESSMENT:  CLINICAL IMPRESSION:  Doing very well with exercise form. Some cuing required to decrease lumbar lordosis in quadruped exercises. Cuing to decrease cervical extension as well. Has a referral to pelvic floor PT and will reach out to schedule evaluation.   OBJECTIVE IMPAIRMENTS decreased activity tolerance, difficulty walking, increased muscle spasms, postural dysfunction, and pain.   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      GOALS:  SHORT TERM GOALS: Target date:12/06/21  Will determine need for heel lift Baseline: Goal status: achieved   LONG TERM GOALS: Target date: 04/10/2022   Bil hip abd strength to meet age appropriate levels per hand held dynamometry Baseline: Lt: 24, 26, 29 lb   Rt: 60 30 28; age appropriate is 45lb Goal status: Ongoing  2.  Able to demonstrate proper form in long  term core strength Baseline:  Goal status: Ongoing  3.  Pt will be able to tolerate sitting or standing for 30 minute periods without increase in pain Baseline:  Goal status: partially met.  4. Pt will be able to demonstrate/report ability to sit/stand/sleep for extended periods of time without pain in order to demonstrate functional improvement and tolerance to static positioning.  Goal Status: Patient reports she feels better but still continues to feel discomfort across her lower back in static positions  5. Pt will be able to lift/squat/hold >20 lbs in order to demonstrate functional improvement in lumbopelvic and UE strength for return to ADL/household duties.  Goal Status: achieved       PLAN: PT FREQUENCY: Every other week PT DURATION: 8 weeks  PLANNED INTERVENTIONS: Therapeutic exercises, Therapeutic activity, Neuromuscular re-education, Balance training, Gait training, Patient/Family education, Self Care, Joint mobilization, Stair training, Aquatic Therapy, Spinal mobilization, Cryotherapy, Moist heat, Taping, Manual therapy, and Re-evaluation.  PLAN FOR NEXT SESSION: continue HEP; build out gym/weight program, continue to educate on proper hip hinge form, plank progressions for core stability  Aurianna Earlywine C. Latrelle Bazar PT, DPT 06/21/22 1:19 PM

## 2022-06-21 NOTE — Telephone Encounter (Signed)
Patient request to speak with a supervisor in regard a office visit she recently had .[name] was sent .

## 2022-06-22 NOTE — Progress Notes (Deleted)
NEUROLOGY FOLLOW UP OFFICE NOTE  VALISA CRUMRINE [id number]  Assessment/Plan:   1  Migraine without aura, without status migrainosus, not intractable - probably cervicogenic 2  Cervical radiculopathy 3  Low back pain with bilateral radiculopathy   Increase nortriptyline to '25mg'$  at bedtime. Migraine rescue:  Ubrelvy '100mg'$  Methocarbamol '500mg'$  up to 3 times daily Undergoing physical therapy.  She would like a referral back to Dr. Glennon Mac of Sports Medicine to further address neck and back pain. Limit use of pain relievers to no more than 2 days out of week to prevent risk of rebound or medication-overuse headache. Keep headache diary Follow up 4-5 months.     Subjective:  [name] is a 52 year old right-handed female with history of breast cancer s/p bilateral mastectomies, radiation and chemotherapy who follows up for migraines and memory deficits.   UPDATE: Last visit,increased nortriptyline to '25mg'$  at bedtime address both headache and radicular pain.     Intensity:  Severe Duration:  2 hours with Roselyn Meier Frequency:  5 to 10 a month With frequent neck and back pain   Current NSAIDS:  Excedrin Tension Current analgesics:  none Current triptans:  rizatriptan '10mg'$  Current ergotamine:  none Current anti-emetic:  none Current muscle relaxants:  Robaxin '500mg'$  TID, Flexeril '10mg'$  Q8h PRN Current anti-anxiolytic:  none Current sleep aide:  none Current Antihypertensive medications:  none Current Antidepressant medications:  nortriptyline '25mg'$  at bedtime Current Anticonvulsant medications:  none Current anti-CGRP:  Ubrelvy '100mg'$  Current Vitamins/Herbal/Supplements:  Co-Q10 '150mg'$  daily, D, biotin, turmeric curcumin Current Antihistamines/Decongestants:  none Other therapy:  Ice pack on back of neck and over eyes. Hormone/birth control:  Vitafol Fe   Caffeine:  No coffee. Mt Dew rarely Diet:  Drinks plenty of water.  Not a big eater.  Eats protein bars, fruits and  vegetables.  Sometimes a doughnut. Exercise:  Active but not routine Mood:  Does not feel depressed but not as happy as she used to be. Other pain:  Low back pain sometimes radiating down either/both legs.  Has seen orthopedics and pain management.       HISTORY:  She has had headaches since her early 41s.     She has history of recurrent breast cancer (2012 and 2015) status post bilateral mastectomies, radiating and chemotherapy.  She is currently in remission without evidence of disease.  She developed severe headaches during her treatment in 2015.  She has had headaches since then.  Often, she wakes up with pressure-like headaches (pulsating in temples).  Sometimes they may occur later in the day.  Sometimes they are moderate if they occur later in the day, but otherwise severe, either at base of her skull radiating up to the front and in the temples.  No neck pain.  She has associated photophobia, phonophobia, rarely nausea, no associated visual disturbance.  They last usually a couple of hours but may last 1/2 a day.  Severe headaches occur about 10 days a months.  She takes Excedrin daily, which helps.  No specific trigger.  She does report degenerative disc disease in her cervical spine.  Changing pillows ineffective.  MRI of brain with and without contrast on 03/29/2016 personally reviewed and was unremarkable.  CT soft tissue of neck from 12/29/15 showed C6-7 disc degeneration with severe left neural foraminal stenosis.  She has episodes of dizziness when she is bent over and then stands up.  She also reports short term memory problems.  She reports daytime fatigue and endorses  that she is a "mouth breather".  TSH was 0.952.  MRI of brain with and without contrast on 12/22/2019 was normal.  Neuropsychological testing in October 2021 demonstrated no cognitive deficits.  She began experiencing pain and numbness into the left shoulder, arm and hand.  Repeat MRI of cervical spine on 01/23/2021 personally  reviewed showed leftward disc osteophyte complex and spurring causing severe left foraminal stenosis at C6-7 as well as mild disc bulging and spurring at C3-4 and C4-5 without significant stenosis.     Past NSAIDS:  Ibuprofen, naproxen Past analgesics:  Tylenol, Tylenol #3 Past abortive triptans:  rizatriptan '10mg'$  (upset stomach) Past abortive ergotamine:  none Past muscle relaxants:  Robaxin, Flexeril Past anti-emetic:  none Past antihypertensive medications:  none Past antidepressant medications:  venlafaxine XR '75mg'$  (side effects), Cymbalta Past anticonvulsant medications:  gabapentin Past anti-CGRP:  none Past vitamins/Herbal/Supplements:  none Past antihistamines/decongestants:  none Other past therapies:  none  PAST MEDICAL HISTORY: Past Medical History:  Diagnosis Date   Abdominal pain, epigastric 11/20/2019   Asthma    as a child   Cervical dysplasia    Gastroesophageal reflux disease 11/20/2019   Genetic testing 02/01/2018   Genetic testing was performed in 2015. The [employer] OvaNext panel was ordered. In total, 23 genes were analyzed as part of this panel: ATM, BARD1, BRCA1, BRCA2, BRIP1, CDH1, CHEK2, EPCAM, MLH1, MRE11A, MSH2, MSH6, MUTYH, NBN, NF1, PALB2, PMS2, PTEN, RAD50, RAD51C, RAD51D, STK11, and TP53. No pathogenic variants were detected. Genetic testing did detect a Variant of Unknown Significance (VUS) at the t   GERD (gastroesophageal reflux disease)    had during chemo   Headache 06/12/2013   History of chemotherapy    History of kidney stones    History of radiation therapy 11/24/13- 01/08/14   reconstructed right breast/chest wall 4500 cGy 25 sessions, electron beam boost 1440 cGy 8 sessions   Liver masses 12/06/2016   Major depressive disorder, in remission 03/12/2017   Malignant neoplasm of lower-inner quadrant of right breast of female, estrogen receptor positive 2012   Pneumonia    PONV (postoperative nausea and vomiting)    Recurrent cancer of right breast  12/22/2013   Thrombocytopenia 05/22/2013    MEDICATIONS: Current Outpatient Medications on File Prior to Visit  Medication Sig Dispense Refill   Acetaminophen-Caffeine (EXCEDRIN TENSION HEADACHE PO) Take by mouth.     Biotin 5000 MCG CAPS Take 5,000 mcg by mouth daily.     Black Pepper-Turmeric (TURMERIC CURCUMIN) 08-998 MG CAPS Take by mouth 2 (two) times daily.     Coenzyme Q10 (COQ10) 150 MG CAPS Take 1 capsule by mouth once.     Digestive Enzyme CAPS Take by mouth.     DULoxetine (CYMBALTA) 30 MG capsule Take 1 capsule (30 mg total) by mouth daily. 30 capsule 2   LORazepam (ATIVAN) 0.5 MG tablet Take 1 tablet (0.5 mg total) by mouth daily as needed for anxiety. 15 tablet 0   Prenat-Fe Poly-Methfol-FA-DHA (VITAFOL FE+) 90-0.6-0.4-200 MG CAPS Take 1 tablet by mouth daily. 30 capsule 11   Probiotic Product (PROBIOTIC DAILY) CAPS Take by mouth.     No current facility-administered medications on file prior to visit.    ALLERGIES: Allergies  Allergen Reactions   Tussionex Pennkinetic Er [Hydrocod Poli-Chlorphe Poli Er] Other (See Comments)    hallucinations   Morphine Rash   Morphine And Related Anxiety    anxiety   Penicillin G Rash   Penicillins Rash    FAMILY HISTORY: Family  History  Adopted: Yes  Problem Relation Age of Onset   Colon cancer Mother 29   Rectal cancer Mother    Cervical cancer Mother    Heart attack Father    Cancer Maternal Aunt 81       stomach and ovarian as separate primaries   Cancer Maternal Aunt 55       breast   Stomach cancer Maternal Aunt    Cancer Maternal Uncle 65       lung; smoker   Cancer Maternal Uncle 63       prostate   Cancer Maternal Grandmother 75       enodometrial   Cancer Cousin 65       melanoma; mat first cousin, located on neck      Objective:  *** General: No acute distress.  Patient appears well-groomed.   Head:  Normocephalic/atraumatic Eyes:  Fundi examined but not visualized Neck: supple, no paraspinal  tenderness, full range of motion Heart:  Regular rate and rhythm Lungs:  Clear to auscultation bilaterally Back: No paraspinal tenderness Neurological Exam: alert and oriented to person, place, and time.  Speech fluent and not dysarthric, language intact.  CN II-XII intact. Bulk and tone normal, muscle strength 5/5 throughout.  Sensation to light touch intact.  Deep tendon reflexes 2+ throughout, toes downgoing.  Finger to nose testing intact.  Gait normal, Romberg negative.   Judith Clines, DO  CC: Judith Holm, MD

## 2022-06-26 ENCOUNTER — Ambulatory Visit: Payer: Commercial Managed Care - HMO | Admitting: Neurology

## 2022-07-05 ENCOUNTER — Ambulatory Visit (HOSPITAL_BASED_OUTPATIENT_CLINIC_OR_DEPARTMENT_OTHER): Payer: Commercial Managed Care - HMO | Admitting: Physical Therapy

## 2022-07-18 ENCOUNTER — Encounter: Payer: Self-pay | Admitting: Gastroenterology

## 2022-07-26 ENCOUNTER — Other Ambulatory Visit: Payer: Self-pay | Admitting: Obstetrics

## 2022-07-26 DIAGNOSIS — M791 Myalgia, unspecified site: Secondary | ICD-10-CM

## 2022-08-02 ENCOUNTER — Ambulatory Visit: Payer: BLUE CROSS/BLUE SHIELD | Admitting: Internal Medicine

## 2022-08-03 ENCOUNTER — Ambulatory Visit: Payer: Commercial Managed Care - HMO | Attending: Gastroenterology

## 2022-08-03 ENCOUNTER — Other Ambulatory Visit: Payer: Self-pay

## 2022-08-03 DIAGNOSIS — R279 Unspecified lack of coordination: Secondary | ICD-10-CM | POA: Diagnosis present

## 2022-08-03 DIAGNOSIS — K5900 Constipation, unspecified: Secondary | ICD-10-CM | POA: Diagnosis present

## 2022-08-03 DIAGNOSIS — M6281 Muscle weakness (generalized): Secondary | ICD-10-CM | POA: Diagnosis present

## 2022-08-03 DIAGNOSIS — M62838 Other muscle spasm: Secondary | ICD-10-CM | POA: Diagnosis present

## 2022-08-03 NOTE — Patient Instructions (Addendum)
Squatty potty: When your knees are level or below the level of your hips, pelvic floor muscles are pressed against rectum, preventing ease of bowel movement. By getting knees above the level of the hips, these pelvic floor muscles relax, allowing easier passage of bowel movement. ? Ways to get knees above hips: o Squatty Potty (7inch and 9inch versions) o Small stool o Roll of toilet paper under each foot o Hardback book or stack of magazines under each foot  Relaxed Toileting mechanics: Once in this position, make sure to lean forward with forearms on thighs, wide knees, relaxed stomach, and breathe.   Balloon Breathing   Vulvar/vaginal Massage: This is a technique to help decrease painful sensitivity in the vaginal area. It can also help to restore normal moisture levels in the vaginal tissues. With coconut oil, aloe, jojoba oil, or a specific vaginal moisturizer, gently massage into vaginal tissues. Think of this as part of your post-shower routine and moisturizing just like you would the rest of the body with lotion. This helps to increase good blood flow to the vaginal tissues. In addition, it also teaches the body that touch to the vagina does not have to be painful or threatening, but moisturizing and gentle.  Bowel massage: To assist with more regular and more comfortable bowel movements, try performing bowel massage nightly for 5-10 minutes. Place hands in the lower right side of your abdomen to start; in small circles, massage up, across, and down the left side of your abdomen. Pressure does not need to be hard, but just comfortable. You can use lotion or oil to make more comfortable.     [facility] [address] [place], [name] [address] Phone # [phone] Fax [phone]

## 2022-08-03 NOTE — Therapy (Signed)
OUTPATIENT PHYSICAL THERAPY FEMALE PELVIC EVALUATION   Patient Name: [name] MRN: [id number] DOB:1970/08/21, 52 y.o., female Today's Date: 08/03/2022  END OF SESSION:  PT End of Session - 08/03/22 1406     Visit Number 1   Pelvic floor PT   Date for PT Re-Evaluation 01/18/23   Pelvic floor PT   Authorization Type [employer]    PT Start Time 1406    PT Stop Time 1440    PT Time Calculation (min) 34 min    Activity Tolerance Patient tolerated treatment well    Behavior During Therapy [facility] for tasks assessed/performed             Past Medical History:  Diagnosis Date   Abdominal pain, epigastric 11/20/2019   Asthma    as a child   Cervical dysplasia    Gastroesophageal reflux disease 11/20/2019   Genetic testing 02/01/2018   Genetic testing was performed in 2015. The [employer] OvaNext panel was ordered. In total, 23 genes were analyzed as part of this panel: ATM, BARD1, BRCA1, BRCA2, BRIP1, CDH1, CHEK2, EPCAM, MLH1, MRE11A, MSH2, MSH6, MUTYH, NBN, NF1, PALB2, PMS2, PTEN, RAD50, RAD51C, RAD51D, STK11, and TP53. No pathogenic variants were detected. Genetic testing did detect a Variant of Unknown Significance (VUS) at the t   GERD (gastroesophageal reflux disease)    had during chemo   Headache 06/12/2013   History of chemotherapy    History of kidney stones    History of radiation therapy 11/24/13- 01/08/14   reconstructed right breast/chest wall 4500 cGy 25 sessions, electron beam boost 1440 cGy 8 sessions   Liver masses 12/06/2016   Major depressive disorder, in remission 03/12/2017   Malignant neoplasm of lower-inner quadrant of right breast of female, estrogen receptor positive 2012   Pneumonia    PONV (postoperative nausea and vomiting)    Recurrent cancer of right breast 12/22/2013   Thrombocytopenia 05/22/2013   Past Surgical History:  Procedure Laterality Date   ADENOIDECTOMY     BREAST LUMPECTOMY WITH NEEDLE LOCALIZATION Right 05/05/2013   Procedure: EXCISION RECURRENT  CANCER RIGHT BREAST WITH NEEDLE LOCALOZATION;  Surgeon: [name], MD;  Location: [facility] OR;  Service: General;  Laterality: Right;   BREAST RECONSTRUCTION  06/29/2011   Procedure: BREAST RECONSTRUCTION;  Surgeon: Wayland Denis, DO;  Location: [facility] OR;  Service: Plastics;  Laterality: Bilateral;  Immediate Bilateral Breast Reconstruction with Bilateral Tissue Expanders and placement of  Alloderm    DILATION AND CURETTAGE OF UTERUS     fibroid tumor surgery     lazy eye     corrective surgery   MASTECTOMY W/ SENTINEL NODE BIOPSY  06/29/2011   Procedure: MASTECTOMY WITH SENTINEL LYMPH NODE BIOPSY;  Surgeon: [name], MD;  Location: [facility] OR;  Service: General;  Laterality: Right;  right skin spairing mstectomy and right sentinel lymph node biopsy   PORT-A-CATH REMOVAL  03/12/2012   Procedure: MINOR REMOVAL PORT-A-CATH;  Surgeon: [name], MD;  Location: [facility];  Service: General;  Laterality: N/A;  Upper left   PORTACATH PLACEMENT Left 05/05/2013   Procedure: INSERTION PORT-A-CATH;  Surgeon: [name], MD;  Location: [facility] OR;  Service: General;  Laterality: Left;   RHINOPLASTY     uterine cervical treatments  2002   for precancerous lesions   Patient Active Problem List   Diagnosis Date Noted   Constipation 05/25/2022   Other fatigue 05/25/2022   Dizziness 05/25/2022   Endometrial thickening on ultrasound 05/24/2022  Abnormal laboratory test result 04/25/2022   Snoring 07/25/2021   Excessive daytime sleepiness 07/25/2021   Routine general medical examination at a health care facility 07/08/2021   Positive ANA (antinuclear antibody) 03/18/2021   Left leg pain 03/18/2021   Arthralgia 03/11/2021   Back pain 02/26/2021   Neck pain 02/26/2021   Gastroesophageal reflux disease 11/20/2019   Major depressive disorder, in remission 03/12/2017   Liver masses 12/06/2016   Recurrent cancer of right breast 12/22/2013   Headache 06/12/2013   Thrombocytopenia  05/22/2013   Malignant neoplasm of lower-inner quadrant of right breast of female, estrogen receptor positive 2012    PCP: Myrlene Broker, MD  REFERRING PROVIDER: Napoleon Form, MD   REFERRING DIAG: M62.89 (ICD-10-CM) - Pelvic floor dysfunction  THERAPY DIAG:  Muscle weakness (generalized)  Other muscle spasm  Unspecified lack of coordination  Constipation, unspecified constipation type  Rationale for Evaluation and Treatment: Rehabilitation  ONSET DATE: 1 year  SUBJECTIVE:                                                                                                                                                                                           EVAL SUBJECTIVE STATEMENT: Pt states that she has been having constipation for a very long time now in addition to low back pain.  Fluid intake: Yes: drinks a lot of water    PAIN:  Are you having pain? Yes NPRS scale: 4/10 Pain location:  low back pain  Pain type: aching Pain description: intermittent and constant   Aggravating factors: prolonged sitting, constipation Relieving factors: movement  PRECAUTIONS: None  WEIGHT BEARING RESTRICTIONS: No  FALLS:  Has patient fallen in last 6 months? 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, bil mastectomy, osteopenia, fibroid removal in 2004   Sexual abuse: No  BOWEL MOVEMENT: Pain with bowel movement: No, but have been in the past, sometimes will throw up during bowel movement Type of bowel movement:Frequency every 1-2 weeks, Strain No, and Splinting no Fully empty rectum: No Leakage: No Pads: No Fiber supplement: No, but tries to eat fiber rich foods; she does over the counter stool softeners, sometimes  Miralax Feels like she does not get appropriate urgency to have a bowel movement  URINATION: Pain with urination: No Fully empty bladder: Yes: - Stream:  start and stop Urgency: Yes: but no leaking Frequency: will vary on the day, bad days every  1-2 hours, on a good day every 2-3 hours Leakage:  none Pads: No  INTERCOURSE: Pain with intercourse: Initial Penetration and During Penetration Ability to have vaginal penetration:  Yes: yes, pain and bleeding at posterior fourchette  Climax: sometimes    PREGNANCY: Vaginal deliveries 1 Tearing Yes: episiotomy   OBJECTIVE:   DIAGNOSTIC FINDINGS:  MRI, [place], CT scans all of abdomen and pelvis with demonstrating constipation  COGNITION: Overall cognitive status: Within functional limits for tasks assessed     SENSATION: Light touch: Appears intact Proprioception: Appears intact  GAIT: Comments: WNL  POSTURE:  Rt facing sacral location   LUMBARAROM/PROM:  A/PROM A/PROM  Eval (% available)  Flexion 100  Extension 100  Right lateral flexion 100  Left lateral flexion 100  Right rotation 100  Left rotation 100   (Blank rows = not tested)    PALPATION:   General  NA                External Perineal Exam NA                             Internal Pelvic Floor NA  Patient confirms identification and approves PT to assess internal pelvic floor and treatment Yes - next session  PELVIC MMT:   MMT eval  Vaginal   Internal Anal Sphincter   External Anal Sphincter   Puborectalis   Diastasis Recti   (Blank rows = not tested)        TONE: NA  PROLAPSE: NA  TODAY'S TREATMENT:                                                                                                                              DATE:  08/03/22  EVAL  Therapeutic activities: Squatty potty Relaxed toilet mechanics Balloon breathing Lubricants and samples given  Vulvovaginal massage    PATIENT EDUCATION:  Education details: See  above Person educated: Patient Education method: Programmer, multimedia, Demonstration, Tactile cues, Verbal cues, and Handouts Education comprehension: verbalized understanding  HOME EXERCISE PROGRAM: Written hand out   ASSESSMENT:  CLINICAL IMPRESSION: Patient is a 52 y.o. female who was seen today for physical therapy evaluation and treatment for chronic constipation and low back pain. Exam findings very limited this treatment session due to extensive subjective history. Based off history, believe that symptoms may be most consistent with pelvic floor muscle tension and poor pressure management/expulsion techniques; we will be able to evaluate further with internal vaginal/rectal pelvic floor muscle assessment next treatment session. Initial treatment included pt education on squatty potty, relaxed toilet mechanics, balloon breathing, lubricants (samples given), and vulvovaginal massage. She will continue to benefit from skilled PT intervention in order to improve frequency and ease of bowel movements, decrease low back pain, implement functional strengthening program for core and pelvic floor with appropriate pressure management, and decrease dyspareunia.   OBJECTIVE IMPAIRMENTS: decreased activity tolerance, decreased coordination, decreased  endurance, decreased strength, increased fascial restrictions, increased muscle spasms, impaired tone, postural dysfunction, and pain.   ACTIVITY LIMITATIONS:  bowel movements   PARTICIPATION LIMITATIONS:  functional activities  PERSONAL FACTORS: 1 comorbidity: medical history  are also affecting patient's functional outcome.   REHAB POTENTIAL: Good  CLINICAL DECISION MAKING: Stable/uncomplicated  EVALUATION COMPLEXITY: Low   GOALS: Goals reviewed with patient? Yes  SHORT TERM GOALS: Target date: 09/14/22  Pt will be independent with HEP.   Baseline: Goal status: INITIAL  2.  Pt will be independent with use of squatty potty, relaxed toileting  mechanics, and improved bowel movement techniques in order to increase ease of bowel movements and complete evacuation.   Baseline:  Goal status: INITIAL  3.  Pt will be independent with diaphragmatic breathing and down training activities in order to improve pelvic floor relaxation.  Baseline:  Goal status: INITIAL    LONG TERM GOALS: Target date: 01/18/2023  Pt will be independent with advanced HEP.   Baseline:  Goal status: INITIAL  2.  Pt will increase frequency of bowel movements to 3-4x/week in order to improve comfort.  Baseline:  Goal status: INITIAL  3.  Pt will demonstrate normal pelvic floor muscle tone and A/ROM, able to achieve 4/5 strength with contractions and 10 sec endurance, in order to provide appropriate lumbopelvic support in functional activities.   Baseline:  Goal status: INITIAL  4.  Pt will report low back pain no higher than 2/10. Baseline:  Goal status: INITIAL   PLAN:  PT FREQUENCY: 1-2x/week  PT DURATION: 6 months  PLANNED INTERVENTIONS: Therapeutic exercises, Therapeutic activity, Neuromuscular re-education, Balance training, Gait training, Patient/Family education, Self Care, Joint mobilization, Dry Needling, Biofeedback, and Manual therapy  PLAN FOR NEXT SESSION: Plan to perform internal vaginal and rectal pelvic floor muscle assessment.    Julio Alm, PT, DPT04/25/244:39 PM

## 2022-08-21 NOTE — Telephone Encounter (Signed)
Patient rescheduled to 8/14 at 2:15am

## 2022-08-24 ENCOUNTER — Ambulatory Visit: Payer: Commercial Managed Care - HMO | Attending: Orthopaedic Surgery | Admitting: Internal Medicine

## 2022-08-24 ENCOUNTER — Encounter: Payer: Self-pay | Admitting: Internal Medicine

## 2022-08-24 VITALS — BP 98/64 | HR 69 | Resp 14 | Ht 63.5 in | Wt 124.0 lb

## 2022-08-24 DIAGNOSIS — R768 Other specified abnormal immunological findings in serum: Secondary | ICD-10-CM | POA: Insufficient documentation

## 2022-08-24 DIAGNOSIS — K5900 Constipation, unspecified: Secondary | ICD-10-CM | POA: Insufficient documentation

## 2022-08-24 DIAGNOSIS — K219 Gastro-esophageal reflux disease without esophagitis: Secondary | ICD-10-CM | POA: Insufficient documentation

## 2022-08-24 DIAGNOSIS — G8929 Other chronic pain: Secondary | ICD-10-CM | POA: Insufficient documentation

## 2022-08-24 DIAGNOSIS — R160 Hepatomegaly, not elsewhere classified: Secondary | ICD-10-CM | POA: Insufficient documentation

## 2022-08-24 DIAGNOSIS — R899 Unspecified abnormal finding in specimens from other organs, systems and tissues: Secondary | ICD-10-CM | POA: Insufficient documentation

## 2022-08-24 DIAGNOSIS — R300 Dysuria: Secondary | ICD-10-CM | POA: Insufficient documentation

## 2022-08-24 DIAGNOSIS — M549 Dorsalgia, unspecified: Secondary | ICD-10-CM | POA: Insufficient documentation

## 2022-08-24 DIAGNOSIS — M2550 Pain in unspecified joint: Secondary | ICD-10-CM | POA: Insufficient documentation

## 2022-08-24 NOTE — Progress Notes (Signed)
Office Visit Note  Patient: [name]             Date of Birth: 05-13-1970           MRN: [id number]             PCP: Myrlene Broker, MD Referring: Myrlene Broker, * Visit Date: 08/24/2022   Subjective:  Follow-up (Doing good)   History of Present Illness: [name] is a 52 y.o. female here for follow up for joint pain and fatigue with positive ANA no specific connective tissue disease identified at evaluation so far.  Since our last visit she had reassuring pathology results for the thickened endometrial biopsy.  Subsequently followed with gastroenterology she had additional abdominal imaging with the finding of multiple liver cysts with labs consistently showing normal liver function.  She does have constipation does not have severe abdominal pain.  Does have some dysuria but does not notice any blood or discoloration or odor.  Ongoing back pain positive for both lying at rest and sometimes up and moving no radiation or numbness in the legs.   Previous HPI 05/25/22 [name] is a 52 y.o. female here for follow up for joint pain and fatigue with positive ANA testing.  Lab workup at our initial visit was entirely negative for specific antibodies but showed ANA consistent positive at a higher titer than before.  Also had normal thyroid function.  Since that visit she has had some additional workup with abdominal MRI due to worsened bloating abdominal discomfort and unintentional weight loss.  Also had endometrial biopsy yesterday due to thickening and cystic changes noted on transvaginal ultrasound exam.  Besides these she continues having aches and pains most prominently throughout her legs on a daily basis.  Does see distal leg swelling usually by the end of the day without any overlying skin rash or discoloration.  Ongoing eye redness and irritation and feels dry most of the time, she thinks there may be some discoloration but no new changes in visual acuity.   Still has excessive fatigue regardless of sleep pattern.  She is experiencing periods of dizziness often provoked with time spent outdoors but sometimes just with positional change during no particular activity.     Previous HPI 04/25/22 LESHANDA [name] is a 52 y.o. female here for evaluation of positive ANA checked in association with ongoing symptoms including joint pain and fatigue most predominantly.  She has had some chronic issues with back pain and multiple levels attributed to some degenerative joint disease in the spine that is more longstanding.  However for about the past 1 year she has increase in symptoms and also experiencing some more pain in other areas her extremities and in bilateral hips.  There is more tenderness to pressure in the distal legs on both sides below the level of the knee.  She notices some possible swelling involving lower extremities but none localized to specific joint areas.  She does not notice any particular erythema or warmth in affected areas.  She tends to notice worsening after period of increased activity or exertion taking 1 or 2 days to recover from fatigue and aches.  She was taking Excedrin which was partially helpful for symptoms but stopped due to irritation with upper abdominal discomfort. Outside of joint problems she experiences persistent fatigue with a feeling of very low energy level.  She does not notice any particular sleep disruption and is not sleepy or taking naps during  the daytime.   She has chronic gastrointestinal symptoms with pretty severe constipation predominant irritable bowel symptoms.  She also has some history of GERD without known upper GI complication.  She has had multiple liver masses present on CT imaging but with no high risk features identified and negative for increased metabolic activity on PET scan.   She reports generalized alopecia with some hair thinning or breaking without any focal bald spots or rash or scarring on the  scalp.  Skin sensitivity to light with occasional erythema but no persistent rashes.  She has some chronic dryness affecting eyes and mouth no history of oral or nasal ulcers.  No persistent lymphadenopathy.  She denies typical Raynaud's symptoms with no fingernail changes.   Breast cancer Hx [name] is a 52 year old woman with h/o ER positive breast cancer diagnosed in 2012 s/p bilateral mastectomies, chemotherapy, and tamoxifen. She developed recurrence in 2015, stage IA triple positive breast cancer s/p TDM1, adjuvant radiation, and unable to tolerate long term antiestrogen therapy.    Labs reviewed 02/2021 ANA 1:40 homogenous dsDNA, Sm, SSA, SSB, Scl-70 Abs neg RF neg   Imaging reviewed 01/20/21 MRI Lumbar spine Disc levels: T12-L1: Negative. L1-2: Negative. L2-3: Negative. L3-4: A rightward disc protrusion is present. Mild facet hypertrophy is noted bilaterally. Mild right foraminal narrowing is new. The central canal and left foramen are patent. L4-5: A broad-based disc protrusion is asymmetric to the right. Mild facet hypertrophy and ligamentum flavum thickening are noted. Moderate right subarticular narrowing is present. Progressive moderate foraminal narrowing is noted bilaterally, right greater than left. L5-S1: Mild facet hypertrophy is noted bilaterally. No significant disc protrusion or stenosis is present.   03/21/21 Xray left knee Trace patellofemoral spurring. Otherwise unremarkable radiographic appearance.   Review of Systems  Constitutional:  Positive for fatigue.  HENT:  Positive for mouth sores and mouth dryness.   Eyes:  Positive for dryness.  Respiratory:  Negative for shortness of breath.   Cardiovascular:  Negative for chest pain and palpitations.  Gastrointestinal:  Positive for constipation. Negative for blood in stool and diarrhea.  Endocrine: Positive for increased urination.  Genitourinary:  Negative for involuntary urination.  Musculoskeletal:  Positive  for joint pain, joint pain, joint swelling, myalgias, muscle weakness, morning stiffness, muscle tenderness and myalgias. Negative for gait problem.  Skin:  Positive for hair loss, redness and sensitivity to sunlight. Negative for color change and rash.  Allergic/Immunologic: Negative for susceptible to infections.  Neurological:  Positive for headaches. Negative for dizziness.  Hematological:  Positive for swollen glands.  Psychiatric/Behavioral:  Positive for sleep disturbance. Negative for depressed mood. The patient is nervous/anxious.     PMFS History:  Patient Active Problem List   Diagnosis Date Noted   Constipation 05/25/2022   Other fatigue 05/25/2022   Dizziness 05/25/2022   Endometrial thickening on ultrasound 05/24/2022   Abnormal laboratory test result 04/25/2022   Snoring 07/25/2021   Excessive daytime sleepiness 07/25/2021   Routine general medical examination at a health care facility 07/08/2021   Positive ANA (antinuclear antibody) 03/18/2021   Left leg pain 03/18/2021   Arthralgia 03/11/2021   Back pain 02/26/2021   Neck pain 02/26/2021   Gastroesophageal reflux disease 11/20/2019   Major depressive disorder, in remission 03/12/2017   Liver masses 12/06/2016   Recurrent cancer of right breast 12/22/2013   Headache 06/12/2013   Thrombocytopenia 05/22/2013   Malignant neoplasm of lower-inner quadrant of right breast of female, estrogen receptor positive 2012    Past  Medical History:  Diagnosis Date   Abdominal pain, epigastric 11/20/2019   Asthma    as a child   Cervical dysplasia    Gastroesophageal reflux disease 11/20/2019   Genetic testing 02/01/2018   Genetic testing was performed in 2015. The [employer] panel was ordered. In total, 23 genes were analyzed as part of this panel: ATM, BARD1, BRCA1, BRCA2, BRIP1, CDH1, CHEK2, EPCAM, MLH1, MRE11A, MSH2, MSH6, MUTYH, NBN, NF1, PALB2, PMS2, PTEN, RAD50, RAD51C, RAD51D, STK11, and TP53. No pathogenic variants  were detected. Genetic testing did detect a Variant of Unknown Significance (VUS) at the t   GERD (gastroesophageal reflux disease)    had during chemo   Headache 06/12/2013   History of chemotherapy    History of kidney stones    History of radiation therapy 11/24/13- 01/08/14   reconstructed right breast/chest wall 4500 cGy 25 sessions, electron beam boost 1440 cGy 8 sessions   Liver masses 12/06/2016   Major depressive disorder, in remission 03/12/2017   Malignant neoplasm of lower-inner quadrant of right breast of female, estrogen receptor positive 2012   Pneumonia    PONV (postoperative nausea and vomiting)    Recurrent cancer of right breast 12/22/2013   Thrombocytopenia 05/22/2013    Family History  Adopted: Yes  Problem Relation Age of Onset   Colon cancer Mother 30   Rectal cancer Mother    Cervical cancer Mother    Heart attack Father    Cancer Maternal Aunt 71       stomach and ovarian as separate primaries   Cancer Maternal Aunt 55       breast   Stomach cancer Maternal Aunt    Cancer Maternal Uncle 67       lung; smoker   Cancer Maternal Uncle 63       prostate   Cancer Maternal Grandmother 42       enodometrial   Cancer Cousin 40       melanoma; mat first cousin, located on neck   Past Surgical History:  Procedure Laterality Date   ADENOIDECTOMY     BREAST LUMPECTOMY WITH NEEDLE LOCALIZATION Right 05/05/2013   Procedure: EXCISION RECURRENT CANCER RIGHT BREAST WITH NEEDLE LOCALOZATION;  Surgeon: [name], MD;  Location: [facility] OR;  Service: General;  Laterality: Right;   BREAST RECONSTRUCTION  06/29/2011   Procedure: BREAST RECONSTRUCTION;  Surgeon: Wayland Denis, DO;  Location: [facility] OR;  Service: Plastics;  Laterality: Bilateral;  Immediate Bilateral Breast Reconstruction with Bilateral Tissue Expanders and placement of  Alloderm    DILATION AND CURETTAGE OF UTERUS     fibroid tumor surgery     lazy eye     corrective surgery   MASTECTOMY W/ SENTINEL NODE BIOPSY   06/29/2011   Procedure: MASTECTOMY WITH SENTINEL LYMPH NODE BIOPSY;  Surgeon: [name], MD;  Location: [facility] OR;  Service: General;  Laterality: Right;  right skin spairing mstectomy and right sentinel lymph node biopsy   PORT-A-CATH REMOVAL  03/12/2012   Procedure: MINOR REMOVAL PORT-A-CATH;  Surgeon: [name], MD;  Location: [facility];  Service: General;  Laterality: N/A;  Upper left   PORTACATH PLACEMENT Left 05/05/2013   Procedure: INSERTION PORT-A-CATH;  Surgeon: [name], MD;  Location: [facility] OR;  Service: General;  Laterality: Left;   RHINOPLASTY     uterine cervical treatments  2002   for precancerous lesions   Social History   Social History Narrative   She was adopted by  her grandparents as a child.  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  She works in a Agricultural consultant.right handed   Two story home    Drinks caffeine occasional   Right handed    There is no immunization history on file for this patient.   Objective: Vital Signs: BP 98/64 (BP Location: Left Arm, Patient Position: Sitting, Cuff Size: Normal)   Pulse 69   Resp 14   Ht 5' 3.5" (1.613 m)   Wt 124 lb (56.2 kg)   LMP 03/11/2011   BMI 21.62 kg/m    Physical Exam HENT:     Mouth/Throat:     Mouth: Mucous membranes are moist.     Pharynx: Oropharynx is clear.  Eyes:     Conjunctiva/sclera: Conjunctivae normal.  Cardiovascular:     Rate and Rhythm: Normal rate and regular rhythm.  Pulmonary:     Effort: Pulmonary effort is normal.     Breath sounds: Normal breath sounds.  Abdominal:     General: Abdomen is flat. Bowel sounds are normal.     Palpations: Abdomen is soft.     Tenderness: There is no guarding or rebound.  Musculoskeletal:     Right lower leg: No edema.     Left lower leg: No edema.  Lymphadenopathy:     Cervical: No cervical adenopathy.  Skin:    General: Skin is warm and dry.     Findings: No rash.  Neurological:      Mental Status: She is alert.  Psychiatric:        Mood and Affect: Mood normal.      Musculoskeletal Exam:  Shoulders full ROM no tenderness or swelling Elbows full ROM no tenderness or swelling Wrists full ROM no tenderness or swelling Fingers full ROM no tenderness or swelling Paraspinal muscle tenderness to palpation throughout thoracic and lumbar spine, no radiation Knees full ROM no tenderness or swelling   Investigation: No additional findings.  Imaging: No results found.  Recent Labs: Lab Results  Component Value Date   WBC 5.9 09/30/2021   HGB 13.3 09/30/2021   PLT 300 09/30/2021   NA 141 04/25/2022   K 4.1 04/25/2022   CL 102 04/25/2022   CO2 30 04/25/2022   GLUCOSE 86 04/25/2022   BUN 9 04/25/2022   CREATININE 0.83 04/25/2022   BILITOT 0.6 04/25/2022   ALKPHOS 80 04/25/2022   AST 17 04/25/2022   ALT 12 04/25/2022   PROT 6.6 08/24/2022   ALBUMIN 4.8 04/25/2022   CALCIUM 9.9 04/25/2022   GFRAA >60 08/11/2019    Speciality Comments: No specialty comments available.  Procedures:  No procedures performed Allergies: Tussionex pennkinetic er [hydrocod poli-chlorphe poli er], Morphine, Morphine and codeine, Penicillin g, and Penicillins   Assessment / Plan:     Visit Diagnoses: Positive ANA (antinuclear antibody)  Arthralgia, unspecified joint - Plan: Protein Electrophoresis, (serum), Chromatin (Nucleosomal) Antibody, IgG, IgA, IgM, Rheumatoid factor  Ongoing joint pain and stiffness in multiple areas no peripheral joint synovitis appreciable on exam again today.  Discussed persistent ANA positive may be incidental or related with previous cancer history as she does not have specific clinical criteria.  Would not recheck within a few months of previous test could check for trend later this year.  Will check protein electrophoresis quantitative immunoglobulins and rheumatoid factor today as well for any other evidence of systemic inflammation or alternate  underlying cause.  Liver masses Constipation, unspecified constipation type Gastroesophageal reflux  disease, unspecified whether esophagitis present  Ongoing GI issues takes digestive enzymes symptoms are pretty stable no red flags reported.  I discussed the liver nodules these could commonly be benign hemangiomas.  She is not on any hormonal therapy especially due to history of breast cancer.  Also a small cystic type neoplasm described on imaging at tail of the pancreas based on GI specialist note also thought to be benign and not a major cause of underlying symptoms.  She need to continue follow-up or plan about seeing hepatology clinic.  Chronic back pain, unspecified back location, unspecified back pain laterality  Chronic back pain more consistent with muscular or myofascial pain does not have severe midline problem and no radiculopathy.    Abnormal laboratory test result - Plan: Protein Electrophoresis, (serum), Chromatin (Nucleosomal) Antibody, IgG, IgA, IgM, Rheumatoid factor  Dysuria - Plan: Urinalysis, Routine w reflex microscopic  Will check urinalysis screening for UTI with dysuria complaint.  Does not have history of very frequent urinary tract infection and no recent antibiotic use.  Orders: Orders Placed This Encounter  Procedures   Protein Electrophoresis, (serum)   Chromatin (Nucleosomal) Antibody   IgG, IgA, IgM   Rheumatoid factor   Urinalysis, Routine w reflex microscopic   Urinalysis, Routine w reflex microscopic   No orders of the defined types were placed in this encounter.    Follow-Up Instructions: Return in about 6 months (around 02/24/2023), or if symptoms worsen or fail to improve, for +ANA/MFS .   Fuller Plan, MD  Note - This record has been created using [employer].  Chart creation errors have been sought, but may not always  have been located. Such creation errors do not reflect on  the standard of medical care.

## 2022-08-24 NOTE — Patient Instructions (Addendum)
We checked the ANA test in January so I do not think this needs to be repeated today. May be more useful to recheck later this year if nothing else specific shows up on testing today.  Checking protein electrophoresis test as discussed. Will also check the immunoglobulins previously IgA was checked and normal. Will also check urinalysis today.

## 2022-08-28 ENCOUNTER — Encounter: Payer: Self-pay | Admitting: Gastroenterology

## 2022-08-28 LAB — CHROMATIN (NUCLEOSOMAL) ANTIBODY: Chromatin (Nucleosomal) Antibody: <1 AI

## 2022-08-28 LAB — URINALYSIS, ROUTINE W REFLEX MICROSCOPIC
Bilirubin Urine: NEGATIVE
Glucose, UA: NEGATIVE
Hgb urine dipstick: NEGATIVE
Ketones, ur: NEGATIVE
Leukocytes,Ua: NEGATIVE
Nitrite: NEGATIVE
Protein, ur: NEGATIVE
Specific Gravity, Urine: 1.005 (ref 1.001–1.035)
pH: 8 (ref 5.0–8.0)

## 2022-08-28 LAB — RHEUMATOID FACTOR: Rheumatoid fact SerPl-aCnc: <10 IU/mL (ref <14)

## 2022-08-28 LAB — PROTEIN ELECTROPHORESIS, SERUM
Albumin ELP: 4.4 g/dL (ref 3.8–4.8)
Alpha 1: 0.3 g/dL (ref 0.2–0.3)
Alpha 2: 0.7 g/dL (ref 0.5–0.9)
Beta 2: 0.3 g/dL (ref 0.2–0.5)
Beta Globulin: 0.4 g/dL (ref 0.4–0.6)
Gamma Globulin: 0.6 g/dL — ABNORMAL LOW (ref 0.8–1.7)
Total Protein: 6.6 g/dL (ref 6.1–8.1)

## 2022-08-28 LAB — IGG, IGA, IGM
IgG (Immunoglobin G), Serum: 704 mg/dL (ref 600–1640)
IgM, Serum: 58 mg/dL (ref 50–300)
Immunoglobulin A: 45 mg/dL — ABNORMAL LOW (ref 47–310)

## 2022-08-28 NOTE — Progress Notes (Signed)
Urinalysis came back normal. Additional markers for rheumatoid arthritis or lupus are negative.  Her immunoglobulin A is very slightly below normal, IgM and IgG are normal. The IgA was actually checked 4 months ago though along with celiac Abs and was normal at the time. This may be incidental, decreased immunoglobulins can be a side effect of some chemotherapies but she has not been on anything that would cause this recently.  Overall looks pretty good. I think we are okay to just monitor and could recheck the ANA titer late this year to make sure there is not a persistent upward trend.

## 2022-08-29 ENCOUNTER — Telehealth: Payer: Self-pay | Admitting: *Deleted

## 2022-08-29 ENCOUNTER — Other Ambulatory Visit: Payer: Self-pay | Admitting: *Deleted

## 2022-08-29 MED ORDER — TRULANCE 3 MG PO TABS: ORAL_TABLET | ORAL | 3 refills | Status: DC

## 2022-08-29 NOTE — Telephone Encounter (Signed)
Received fax from [facility] that patient has rescheduled her appointment from 08/23/2022 to 11/22/2022

## 2022-08-30 ENCOUNTER — Telehealth: Payer: Self-pay | Admitting: Pharmacy Technician

## 2022-08-30 ENCOUNTER — Other Ambulatory Visit (HOSPITAL_COMMUNITY): Payer: Self-pay

## 2022-08-30 NOTE — Telephone Encounter (Signed)
Patient Advocate Encounter  Prior Authorization for TRULANCE 3MG  has been approved with [employer].    PA# [id number] Effective dates: 5.22.24 through 5.22.25  Per [facility] test claim, copay for 30 days supply is $92.01.   Received notification from [employer] that prior authorization for TRULANCE 3MG  is required.   PA submitted on 5.22.24 Key [id number] Status is pending

## 2022-08-31 ENCOUNTER — Telehealth: Payer: Self-pay

## 2022-08-31 NOTE — Telephone Encounter (Signed)
Called patient to inform her Trulance was approved... She said she sent a patient advise request that I have not seen yet that her co-pay is $100.00.   I told patient to call her insurance and see what their preferred medication for constipation is. They may pay better for Amitiza. She has not tried that.  She will call back with that information

## 2022-08-31 NOTE — Telephone Encounter (Signed)
Patient contacted the office today to see if Dr. Dimple Casey has responded to her questions about her lab results that were drawn on 08/24/2022. Patient states she would also like to know more in depth why her Immunoglobulin A was low. Patient states she is okay with receiving a call from Dr. Dimple Casey or Dr. Gregary Cromer assistant. Patient call back number is [phone]. Please advise.

## 2022-09-08 ENCOUNTER — Other Ambulatory Visit: Payer: Self-pay | Admitting: *Deleted

## 2022-09-08 MED ORDER — LUBIPROSTONE 24 MCG PO CAPS: 24.0000 ug | ORAL_CAPSULE | Freq: Two times a day (BID) | ORAL | 3 refills | Status: DC

## 2022-09-18 ENCOUNTER — Ambulatory Visit: Payer: BLUE CROSS/BLUE SHIELD | Admitting: Internal Medicine

## 2022-09-21 ENCOUNTER — Ambulatory Visit: Payer: Commercial Managed Care - HMO | Attending: Gastroenterology

## 2022-09-21 DIAGNOSIS — R279 Unspecified lack of coordination: Secondary | ICD-10-CM | POA: Insufficient documentation

## 2022-09-21 DIAGNOSIS — M62838 Other muscle spasm: Secondary | ICD-10-CM | POA: Diagnosis present

## 2022-09-21 DIAGNOSIS — M6281 Muscle weakness (generalized): Secondary | ICD-10-CM | POA: Insufficient documentation

## 2022-09-21 DIAGNOSIS — K5900 Constipation, unspecified: Secondary | ICD-10-CM | POA: Diagnosis present

## 2022-09-21 NOTE — Therapy (Signed)
OUTPATIENT PHYSICAL THERAPY FEMALE PELVIC TREATMENT   Patient Name: [name] MRN: [id number] DOB:Jan 19, 1971, 52 y.o., female Today's Date: 09/21/2022  END OF SESSION:  PT End of Session - 09/21/22 1240     Visit Number 2    Date for PT Re-Evaluation 01/18/23    Authorization Type [employer]    PT Start Time 1237    PT Stop Time 1315    PT Time Calculation (min) 38 min    Activity Tolerance Patient tolerated treatment well    Behavior During Therapy [facility] for tasks assessed/performed              Past Medical History:  Diagnosis Date   Abdominal pain, epigastric 11/20/2019   Asthma    as a child   Cervical dysplasia    Gastroesophageal reflux disease 11/20/2019   Genetic testing 02/01/2018   Genetic testing was performed in 2015. The [employer] Ova[employer] panel was ordered. In total, 23 genes were analyzed as part of this panel: ATM, BARD1, BRCA1, BRCA2, BRIP1, CDH1, CHEK2, EPCAM, MLH1, MRE11A, MSH2, MSH6, MUTYH, NBN, NF1, PALB2, PMS2, PTEN, RAD50, RAD51C, RAD51D, STK11, and TP53. No pathogenic variants were detected. Genetic testing did detect a Variant of Unknown Significance (VUS) at the t   GERD (gastroesophageal reflux disease)    had during chemo   Headache 06/12/2013   History of chemotherapy    History of kidney stones    History of radiation therapy 11/24/13- 01/08/14   reconstructed right breast/chest wall 4500 cGy 25 sessions, electron beam boost 1440 cGy 8 sessions   Liver masses 12/06/2016   Major depressive disorder, in remission 03/12/2017   Malignant neoplasm of lower-inner quadrant of right breast of female, estrogen receptor positive 2012   Pneumonia    PONV (postoperative nausea and vomiting)    Recurrent cancer of right breast 12/22/2013   Thrombocytopenia 05/22/2013   Past Surgical History:  Procedure Laterality Date   ADENOIDECTOMY     BREAST LUMPECTOMY WITH NEEDLE LOCALIZATION Right 05/05/2013   Procedure: EXCISION RECURRENT CANCER RIGHT BREAST WITH NEEDLE  LOCALOZATION;  Surgeon: [name], MD;  Location: [facility] OR;  Service: General;  Laterality: Right;   BREAST RECONSTRUCTION  06/29/2011   Procedure: BREAST RECONSTRUCTION;  Surgeon: Wayland Denis, DO;  Location: [facility] OR;  Service: Plastics;  Laterality: Bilateral;  Immediate Bilateral Breast Reconstruction with Bilateral Tissue Expanders and placement of  Alloderm    DILATION AND CURETTAGE OF UTERUS     fibroid tumor surgery     lazy eye     corrective surgery   MASTECTOMY W/ SENTINEL NODE BIOPSY  06/29/2011   Procedure: MASTECTOMY WITH SENTINEL LYMPH NODE BIOPSY;  Surgeon: [name], MD;  Location: [facility] OR;  Service: General;  Laterality: Right;  right skin spairing mstectomy and right sentinel lymph node biopsy   PORT-A-CATH REMOVAL  03/12/2012   Procedure: MINOR REMOVAL PORT-A-CATH;  Surgeon: [name], MD;  Location: [facility];  Service: General;  Laterality: N/A;  Upper left   PORTACATH PLACEMENT Left 05/05/2013   Procedure: INSERTION PORT-A-CATH;  Surgeon: [name], MD;  Location: [facility] OR;  Service: General;  Laterality: Left;   RHINOPLASTY     uterine cervical treatments  2002   for precancerous lesions   Patient Active Problem List   Diagnosis Date Noted   Constipation 05/25/2022   Other fatigue 05/25/2022   Dizziness 05/25/2022   Endometrial thickening on ultrasound 05/24/2022   Abnormal laboratory test result 04/25/2022  Snoring 07/25/2021   Excessive daytime sleepiness 07/25/2021   Routine general medical examination at a health care facility 07/08/2021   Positive ANA (antinuclear antibody) 03/18/2021   Left leg pain 03/18/2021   Arthralgia 03/11/2021   Back pain 02/26/2021   Neck pain 02/26/2021   Gastroesophageal reflux disease 11/20/2019   Major depressive disorder, in remission 03/12/2017   Liver masses 12/06/2016   Recurrent cancer of right breast 12/22/2013   Headache 06/12/2013   Thrombocytopenia 05/22/2013   Malignant neoplasm  of lower-inner quadrant of right breast of female, estrogen receptor positive 2012    PCP: Myrlene Broker, MD  REFERRING PROVIDER: Napoleon Form, MD   REFERRING DIAG: M62.89 (ICD-10-CM) - Pelvic floor dysfunction  THERAPY DIAG:  Muscle weakness (generalized)  Other muscle spasm  Unspecified lack of coordination  Constipation, unspecified constipation type  Rationale for Evaluation and Treatment: Rehabilitation  ONSET DATE: 1 year  SUBJECTIVE:                                                                                                                                                                                           SUBJECTIVE STATEMENT: Pt states that she got squatty potty and has been using, but her legs go to sleep almost immediately, faster on the Lt compared to the Rt. She has been working on regular bowel massage. She does feel like it may be helpful. She does continue to have low back pain. She does not feel like she is getting any urgency to have bowel movement.   Fluid intake: Yes: drinks a lot of water    PAIN:  Are you having pain? Yes NPRS scale: 4/10 Pain location:  low back pain  Pain type: aching Pain description: intermittent and constant   Aggravating factors: prolonged sitting, constipation Relieving factors: movement  PRECAUTIONS: None  WEIGHT BEARING RESTRICTIONS: No  FALLS:  Has patient fallen in last 6 months? 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, bil mastectomy, osteopenia, fibroid removal in 2004   Sexual abuse: No  BOWEL MOVEMENT: Pain with bowel movement: No, but have been in the past, sometimes will throw up during bowel movement Type of bowel movement:Frequency every 1-2  weeks, Strain No, and Splinting no Fully empty rectum: No Leakage: No Pads: No Fiber supplement: No, but tries to eat fiber rich foods; she does over the counter stool softeners, sometimes Miralax Feels like she does not  get appropriate urgency to have a bowel movement  URINATION: Pain with urination: No Fully empty bladder: Yes: - Stream:  start and stop Urgency: Yes: but no leaking Frequency: will vary on the day, bad days every 1-2 hours, on a good day every 2-3 hours Leakage:  none Pads: No  INTERCOURSE: Pain with intercourse: Initial Penetration and During Penetration Ability to have vaginal penetration:  Yes: yes, pain and bleeding at posterior fourchette  Climax: sometimes    PREGNANCY: Vaginal deliveries 1 Tearing Yes: episiotomy   OBJECTIVE:  09/21/22: PALPATION:   General  Abdominal tenderness                External Perineal Exam WNL                             Internal Pelvic Floor WNL  Patient confirms identification and approves PT to assess internal pelvic floor and treatment Yes   PELVIC MMT:   MMT eval  Vaginal 3/5, 11 sec hold, 6 repeat  Internal Anal Sphincter 2/5  External Anal Sphincter 2/5  Puborectalis 2/5  Diastasis Recti WNL  (Blank rows = not tested)        TONE: WNL  PROLAPSE: Gr 1 anterior vaginal wall laxity, gr 1 posterior vaginal wall laxity 08/03/22: DIAGNOSTIC FINDINGS:  MRI, [place], CT scans all of abdomen and pelvis with demonstrating constipation  COGNITION: Overall cognitive status: Within functional limits for tasks assessed     SENSATION: Light touch: Appears intact Proprioception: Appears intact  GAIT: Comments: WNL  POSTURE:  Rt facing sacral location   LUMBARAROM/PROM:  A/PROM A/PROM  Eval (% available)  Flexion 100  Extension 100  Right lateral flexion 100  Left lateral flexion 100  Right rotation 100  Left rotation 100   (Blank rows = not tested)      TODAY'S TREATMENT:                                                                                                                               DATE:  09/21/22 Manual Pt provides verbal consent for internal vaginal/rectal pelvic floor exam. Internal rectal and vaginal pelvic floor muscle exam Neuromuscular re-education: Pelvic floor muscle contraction training Quick flicks Pelvic floor muscle bulge training with balloon pushing Therapeutic activities: Fiber supplement Strict bowel retraining Bowel massage   08/03/22  EVAL  Therapeutic activities: Squatty potty Relaxed toilet mechanics Balloon breathing Lubricants and samples given  Vulvovaginal massage    PATIENT EDUCATION:  Education details: See above Person educated: Patient Education method: Programmer, multimedia, Demonstration, Tactile cues, Verbal cues, and Handouts Education comprehension: verbalized understanding  HOME EXERCISE PROGRAM: [id number]  ASSESSMENT:  CLINICAL IMPRESSION: Pt education performed on strict bowel retraining to help improve urge to have bowel movement and creating opportunity to have bowel movement; in doing this, we also discussed importance of getting enough fiber with each meal. Internal rectal/vaginal pelvic floor  muscle exam demonstrated descent strength and endurance vaginally, but more weakness with anal sphincters with difficulty bulging/relaxing. She did well with bulge training with balloon pushing, getting much better pelvic floor muscle lengthening. HEP given to practice pelvic floor muscle contractions with focus on full A/ROM and relaxation. She will continue to benefit from skilled PT intervention in order to improve frequency and ease of bowel movements, decrease low back pain, implement functional strengthening program for core and pelvic floor with appropriate pressure management, and decrease dyspareunia.   OBJECTIVE IMPAIRMENTS: decreased activity tolerance, decreased coordination, decreased endurance, decreased strength, increased  fascial restrictions, increased muscle spasms, impaired tone, postural dysfunction, and pain.   ACTIVITY LIMITATIONS:  bowel movements   PARTICIPATION LIMITATIONS:  functional activities  PERSONAL FACTORS: 1 comorbidity: medical history  are also affecting patient's functional outcome.   REHAB POTENTIAL: Good  CLINICAL DECISION MAKING: Stable/uncomplicated  EVALUATION COMPLEXITY: Low   GOALS: Goals reviewed with patient? Yes  SHORT TERM GOALS: Target date: 09/14/22 - updated 09/21/22  Pt will be independent with HEP.   Baseline: Goal status: IN PROGRESS  2.  Pt will be independent with use of squatty potty, relaxed toileting mechanics, and improved bowel movement techniques in order to increase ease of bowel movements and complete evacuation.   Baseline:  Goal status: IN PROGRESS  3.  Pt will be independent with diaphragmatic breathing and down training activities in order to improve pelvic floor relaxation.  Baseline:  Goal status: IN PROGRESS    LONG TERM GOALS: Target date: 01/18/2023 - updated 09/21/22  Pt will be independent with advanced HEP.   Baseline:  Goal status:IN PROGRESS  2.  Pt will increase frequency of bowel movements to 3-4x/week in order to improve comfort.  Baseline:  Goal status: IN PROGRESS  3.  Pt will demonstrate normal pelvic floor muscle tone and A/ROM, able to achieve 4/5 strength with contractions and 10 sec endurance, in order to provide appropriate lumbopelvic support in functional activities.   Baseline:  Goal status:IN PROGRESS  4.  Pt will report low back pain no higher than 2/10. Baseline:  Goal status: IN PROGRESS   PLAN:  PT FREQUENCY: 1-2x/week  PT DURATION: 6 months  PLANNED INTERVENTIONS: Therapeutic exercises, Therapeutic activity, Neuromuscular re-education, Balance training, Gait training, Patient/Family education, Self Care, Joint mobilization, Dry Needling, Biofeedback, and Manual therapy  PLAN FOR NEXT SESSION:  Begin mobility program, manual techniques to abdomen, and core training.    Julio Alm, PT, DPT06/13/241:23 PM

## 2022-09-21 NOTE — Patient Instructions (Signed)
Bladder/bowel retraining:  8-12 grams of fiber with each meal After each meal, go attempt to have a bowel movement within 5- 30 minutes and don't sit on the toilet for more than 5 minutes Drink 4-8oz an hour   Bowel massage: To assist with more regular and more comfortable bowel movements, try performing bowel massage nightly for 5-10 minutes. Place hands in the lower right side of your abdomen to start; in small circles, massage up, across, and down the left side of your abdomen. Pressure does not need to be hard, but just comfortable. You can use lotion or oil to make more comfortable.   [facility] [address], [address] [place], [name] [address] Phone # [phone] Fax [phone]

## 2022-09-26 ENCOUNTER — Ambulatory Visit: Payer: Commercial Managed Care - HMO

## 2022-09-26 DIAGNOSIS — R279 Unspecified lack of coordination: Secondary | ICD-10-CM

## 2022-09-26 DIAGNOSIS — M6281 Muscle weakness (generalized): Secondary | ICD-10-CM | POA: Diagnosis not present

## 2022-09-26 DIAGNOSIS — K5900 Constipation, unspecified: Secondary | ICD-10-CM

## 2022-09-26 DIAGNOSIS — M62838 Other muscle spasm: Secondary | ICD-10-CM

## 2022-09-26 NOTE — Therapy (Addendum)
OUTPATIENT PHYSICAL THERAPY FEMALE PELVIC TREATMENT   Patient Name: [name] MRN: [id number] DOB:[birth year], 52 y.o., female Today's Date: 09/26/2022  END OF SESSION:  PT End of Session - 09/26/22 1151     Visit Number 3    Date for PT Re-Evaluation 01/18/23    Authorization Type [employer]    PT Start Time 1149    PT Stop Time 1227    PT Time Calculation (min) 38 min    Activity Tolerance Patient tolerated treatment well    Behavior During Therapy [facility] for tasks assessed/performed               Past Medical History:  Diagnosis Date   Abdominal pain, epigastric 11/20/2019   Asthma    as a child   Cervical dysplasia    Gastroesophageal reflux disease 11/20/2019   Genetic testing 02/01/2018   Genetic testing was performed in 2015. The [employer] panel was ordered. In total, 23 genes were analyzed as part of this panel: ATM, BARD1, BRCA1, BRCA2, BRIP1, CDH1, CHEK2, EPCAM, MLH1, MRE11A, MSH2, MSH6, MUTYH, NBN, NF1, PALB2, PMS2, PTEN, RAD50, RAD51C, RAD51D, STK11, and TP53. No pathogenic variants were detected. Genetic testing did detect a Variant of Unknown Significance (VUS) at the t   GERD (gastroesophageal reflux disease)    had during chemo   Headache 06/12/2013   History of chemotherapy    History of kidney stones    History of radiation therapy 11/24/13- 01/08/14   reconstructed right breast/chest wall 4500 cGy 25 sessions, electron beam boost 1440 cGy 8 sessions   Liver masses 12/06/2016   Major depressive disorder, in remission 03/12/2017   Malignant neoplasm of lower-inner quadrant of right breast of female, estrogen receptor positive 2012   Pneumonia    PONV (postoperative nausea and vomiting)    Recurrent cancer of right breast 12/22/2013   Thrombocytopenia 05/22/2013   Past Surgical History:  Procedure Laterality Date   ADENOIDECTOMY     BREAST LUMPECTOMY WITH NEEDLE LOCALIZATION Right 05/05/2013   Procedure: EXCISION RECURRENT CANCER RIGHT BREAST WITH NEEDLE  LOCALOZATION;  Surgeon: [name], MD;  Location: [facility] OR;  Service: General;  Laterality: Right;   BREAST RECONSTRUCTION  06/29/2011   Procedure: BREAST RECONSTRUCTION;  Surgeon: Wayland Denis, DO;  Location: [facility] OR;  Service: Plastics;  Laterality: Bilateral;  Immediate Bilateral Breast Reconstruction with Bilateral Tissue Expanders and placement of  Alloderm    DILATION AND CURETTAGE OF UTERUS     fibroid tumor surgery     lazy eye     corrective surgery   MASTECTOMY W/ SENTINEL NODE BIOPSY  06/29/2011   Procedure: MASTECTOMY WITH SENTINEL LYMPH NODE BIOPSY;  Surgeon: [name], MD;  Location: [facility] OR;  Service: General;  Laterality: Right;  right skin spairing mstectomy and right sentinel lymph node biopsy   PORT-A-CATH REMOVAL  03/12/2012   Procedure: MINOR REMOVAL PORT-A-CATH;  Surgeon: [name], MD;  Location: [facility];  Service: General;  Laterality: N/A;  Upper left   PORTACATH PLACEMENT Left 05/05/2013   Procedure: INSERTION PORT-A-CATH;  Surgeon: [name], MD;  Location: [facility] OR;  Service: General;  Laterality: Left;   RHINOPLASTY     uterine cervical treatments  2002   for precancerous lesions   Patient Active Problem List   Diagnosis Date Noted   Constipation 05/25/2022   Other fatigue 05/25/2022   Dizziness 05/25/2022   Endometrial thickening on ultrasound 05/24/2022   Abnormal laboratory test result 04/25/2022  Snoring 07/25/2021   Excessive daytime sleepiness 07/25/2021   Routine general medical examination at a health care facility 07/08/2021   Positive ANA (antinuclear antibody) 03/18/2021   Left leg pain 03/18/2021   Arthralgia 03/11/2021   Back pain 02/26/2021   Neck pain 02/26/2021   Gastroesophageal reflux disease 11/20/2019   Major depressive disorder, in remission 03/12/2017   Liver masses 12/06/2016   Recurrent cancer of right breast 12/22/2013   Headache 06/12/2013   Thrombocytopenia 05/22/2013   Malignant neoplasm  of lower-inner quadrant of right breast of female, estrogen receptor positive 2012    PCP: Myrlene Broker, MD  REFERRING PROVIDER: Napoleon Form, MD   REFERRING DIAG: M62.89 (ICD-10-CM) - Pelvic floor dysfunction  THERAPY DIAG:  Muscle weakness (generalized)  Other muscle spasm  Unspecified lack of coordination  Constipation, unspecified constipation type  Rationale for Evaluation and Treatment: Rehabilitation  ONSET DATE: 1 year  SUBJECTIVE:                                                                                                                                                                                           SUBJECTIVE STATEMENT: Pt reports bloating with consumption of meat in the last week.   Fluid intake: Yes: drinks a lot of water    PAIN:  Are you having pain? Yes NPRS scale: 4/10 Pain location:  low back pain  Pain type: aching Pain description: intermittent and constant   Aggravating factors: prolonged sitting, constipation Relieving factors: movement  PRECAUTIONS: None  WEIGHT BEARING RESTRICTIONS: No  FALLS:  Has patient fallen in last 6 months? 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, bil mastectomy, osteopenia, fibroid removal in 2004   Sexual abuse: No  BOWEL MOVEMENT: Pain with bowel movement: No, but have been in the past, sometimes will throw up during bowel movement Type of bowel movement:Frequency every 1-2 weeks, Strain No, and Splinting no Fully empty rectum: No Leakage: No Pads: No Fiber supplement: No, but tries to eat fiber rich foods; she does over the counter stool softeners, sometimes Miralax Feels like she does not get appropriate urgency to have a bowel  movement  URINATION: Pain with urination: No Fully empty bladder: Yes: - Stream:  start and stop Urgency: Yes: but no leaking Frequency: will vary on the day, bad days every 1-2 hours, on a good day every 2-3 hours Leakage:  none Pads: No  INTERCOURSE:  Pain with intercourse: Initial Penetration and During Penetration Ability to have vaginal penetration:  Yes: yes, pain and bleeding at posterior fourchette  Climax: sometimes    PREGNANCY: Vaginal deliveries 1 Tearing Yes: episiotomy   OBJECTIVE:  09/21/22: PALPATION:   General  Abdominal tenderness                External Perineal Exam WNL                             Internal Pelvic Floor WNL  Patient confirms identification and approves PT to assess internal pelvic floor and treatment Yes   PELVIC MMT:   MMT eval  Vaginal 3/5, 11 sec hold, 6 repeat  Internal Anal Sphincter 2/5  External Anal Sphincter 2/5  Puborectalis 2/5  Diastasis Recti WNL  (Blank rows = not tested)        TONE: WNL  PROLAPSE: Gr 1 anterior vaginal wall laxity, gr 1 posterior vaginal wall laxity 08/03/22: DIAGNOSTIC FINDINGS:  MRI, [place], CT scans all of abdomen and pelvis with demonstrating constipation  COGNITION: Overall cognitive status: Within functional limits for tasks assessed     SENSATION: Light touch: Appears intact Proprioception: Appears intact  GAIT: Comments: WNL  POSTURE:  Rt facing sacral location   LUMBARAROM/PROM:  A/PROM A/PROM  Eval (% available)  Flexion 100  Extension 100  Right lateral flexion 100  Left lateral flexion 100  Right rotation 100  Left rotation 100   (Blank rows = not tested)      TODAY'S TREATMENT:                                                                                                                              DATE:  09/26/22 Neuromuscular re-education: Transversus abdominus training with multimodal cues for improved motor control and breath coordination Bil supine UE  ball press 12x Side lying UE ball press 10x bil Hip adduction ball press 12x Exercises: Lower trunk rotation 2 x 10 Cat cow 2 x 10 Child's pose 10 breaths Happy baby 10 breaths   09/21/22 Manual Pt provides verbal consent for internal vaginal/rectal pelvic floor exam. Internal rectal and vaginal pelvic floor muscle exam Neuromuscular re-education: Pelvic floor muscle contraction training Quick flicks Pelvic floor muscle bulge training with balloon pushing Therapeutic activities: Fiber supplement Strict bowel retraining Bowel massage   08/03/22  EVAL  Therapeutic activities: Squatty potty Relaxed toilet mechanics Balloon breathing Lubricants and samples given  Vulvovaginal massage    PATIENT EDUCATION:  Education details: See above Person educated: Patient Education method: Explanation, Demonstration, Tactile cues, Verbal cues, and Handouts Education comprehension: verbalized understanding  HOME EXERCISE PROGRAM: [id number]  ASSESSMENT:  CLINICAL IMPRESSION: Pt able to start core activation training and mobility exercises with good tolerance. She achieves good core activation, but believe she performs abdominal gripping; therefore, we discussed the importance of making sure she relaxes her core and pelvic  floor with her inhale. We discussed being aware of abdominal gripping and trying to relax these muscles as it will not help with bloating to clench and could be harmful to pelvic floor. She will continue to benefit from skilled PT intervention in order to improve frequency and ease of bowel movements, decrease low back pain, implement functional strengthening program for core and pelvic floor with appropriate pressure management, and decrease dyspareunia.   OBJECTIVE IMPAIRMENTS: decreased activity tolerance, decreased coordination, decreased endurance, decreased strength, increased fascial restrictions, increased muscle spasms, impaired tone, postural dysfunction, and  pain.   ACTIVITY LIMITATIONS:  bowel movements   PARTICIPATION LIMITATIONS:  functional activities  PERSONAL FACTORS: 1 comorbidity: medical history  are also affecting patient's functional outcome.   REHAB POTENTIAL: Good  CLINICAL DECISION MAKING: Stable/uncomplicated  EVALUATION COMPLEXITY: Low   GOALS: Goals reviewed with patient? Yes  SHORT TERM GOALS: Target date: 09/14/22 - updated 09/21/22  Pt will be independent with HEP.   Baseline: Goal status: IN PROGRESS  2.  Pt will be independent with use of squatty potty, relaxed toileting mechanics, and improved bowel movement techniques in order to increase ease of bowel movements and complete evacuation.   Baseline:  Goal status: IN PROGRESS  3.  Pt will be independent with diaphragmatic breathing and down training activities in order to improve pelvic floor relaxation.  Baseline:  Goal status: IN PROGRESS    LONG TERM GOALS: Target date: 01/18/2023 - updated 09/21/22  Pt will be independent with advanced HEP.   Baseline:  Goal status:IN PROGRESS  2.  Pt will increase frequency of bowel movements to 3-4x/week in order to improve comfort.  Baseline:  Goal status: IN PROGRESS  3.  Pt will demonstrate normal pelvic floor muscle tone and A/ROM, able to achieve 4/5 strength with contractions and 10 sec endurance, in order to provide appropriate lumbopelvic support in functional activities.   Baseline:  Goal status:IN PROGRESS  4.  Pt will report low back pain no higher than 2/10. Baseline:  Goal status: IN PROGRESS   PLAN:  PT FREQUENCY: 1-2x/week  PT DURATION: 6 months  PLANNED INTERVENTIONS: Therapeutic exercises, Therapeutic activity, Neuromuscular re-education, Balance training, Gait training, Patient/Family education, Self Care, Joint mobilization, Dry Needling, Biofeedback, and Manual therapy  PLAN FOR NEXT SESSION: Core progressions; abdominal mobilization; mobility progressions.    Julio Alm,  PT, DPT06/18/2412:24 PM

## 2022-10-03 ENCOUNTER — Encounter: Payer: Self-pay | Admitting: Adult Health

## 2022-10-04 ENCOUNTER — Other Ambulatory Visit: Payer: Self-pay | Admitting: *Deleted

## 2022-10-04 ENCOUNTER — Encounter: Payer: Self-pay | Admitting: Adult Health

## 2022-10-04 DIAGNOSIS — C50911 Malignant neoplasm of unspecified site of right female breast: Secondary | ICD-10-CM

## 2022-10-04 DIAGNOSIS — Z170 Estrogen receptor positive status [ER+]: Secondary | ICD-10-CM

## 2022-10-04 DIAGNOSIS — D696 Thrombocytopenia, unspecified: Secondary | ICD-10-CM

## 2022-10-04 DIAGNOSIS — R160 Hepatomegaly, not elsewhere classified: Secondary | ICD-10-CM

## 2022-10-05 ENCOUNTER — Other Ambulatory Visit: Payer: Self-pay

## 2022-10-05 ENCOUNTER — Ambulatory Visit: Payer: Commercial Managed Care - HMO

## 2022-10-05 ENCOUNTER — Inpatient Hospital Stay: Payer: Commercial Managed Care - HMO | Attending: Nurse Practitioner

## 2022-10-05 DIAGNOSIS — M6281 Muscle weakness (generalized): Secondary | ICD-10-CM

## 2022-10-05 DIAGNOSIS — Z853 Personal history of malignant neoplasm of breast: Secondary | ICD-10-CM | POA: Diagnosis present

## 2022-10-05 DIAGNOSIS — F419 Anxiety disorder, unspecified: Secondary | ICD-10-CM | POA: Insufficient documentation

## 2022-10-05 DIAGNOSIS — C50911 Malignant neoplasm of unspecified site of right female breast: Secondary | ICD-10-CM

## 2022-10-05 DIAGNOSIS — D696 Thrombocytopenia, unspecified: Secondary | ICD-10-CM

## 2022-10-05 DIAGNOSIS — Z170 Estrogen receptor positive status [ER+]: Secondary | ICD-10-CM

## 2022-10-05 DIAGNOSIS — R279 Unspecified lack of coordination: Secondary | ICD-10-CM

## 2022-10-05 DIAGNOSIS — K5900 Constipation, unspecified: Secondary | ICD-10-CM

## 2022-10-05 DIAGNOSIS — M62838 Other muscle spasm: Secondary | ICD-10-CM

## 2022-10-05 DIAGNOSIS — R160 Hepatomegaly, not elsewhere classified: Secondary | ICD-10-CM

## 2022-10-05 LAB — CBC WITH DIFFERENTIAL (CANCER CENTER ONLY)
Abs Immature Granulocytes: 0.01 10*3/uL (ref 0.00–0.07)
Basophils Absolute: 0 10*3/uL (ref 0.0–0.1)
Basophils Relative: 1 %
Eosinophils Absolute: 0.2 10*3/uL (ref 0.0–0.5)
Eosinophils Relative: 2 %
HCT: 39.5 % (ref 36.0–46.0)
Hemoglobin: 13 g/dL (ref 12.0–15.0)
Immature Granulocytes: 0 %
Lymphocytes Relative: 26 %
Lymphs Abs: 1.7 10*3/uL (ref 0.7–4.0)
MCH: 30.8 pg (ref 26.0–34.0)
MCHC: 32.9 g/dL (ref 30.0–36.0)
MCV: 93.6 fL (ref 80.0–100.0)
Monocytes Absolute: 0.4 10*3/uL (ref 0.1–1.0)
Monocytes Relative: 6 %
Neutro Abs: 4.4 10*3/uL (ref 1.7–7.7)
Neutrophils Relative %: 65 %
Platelet Count: 273 10*3/uL (ref 150–400)
RBC: 4.22 MIL/uL (ref 3.87–5.11)
RDW: 12.3 % (ref 11.5–15.5)
WBC Count: 6.7 10*3/uL (ref 4.0–10.5)
nRBC: 0 % (ref 0.0–0.2)

## 2022-10-05 LAB — CMP (CANCER CENTER ONLY)
ALT: 20 U/L (ref 0–44)
AST: 21 U/L (ref 15–41)
Albumin: 4.4 g/dL (ref 3.5–5.0)
Alkaline Phosphatase: 83 U/L (ref 38–126)
Anion gap: 6 (ref 5–15)
BUN: 10 mg/dL (ref 6–20)
CO2: 30 mmol/L (ref 22–32)
Calcium: 9.8 mg/dL (ref 8.9–10.3)
Chloride: 105 mmol/L (ref 98–111)
Creatinine: 0.8 mg/dL (ref 0.44–1.00)
GFR, Estimated: >60 mL/min (ref >60)
Glucose, Bld: 76 mg/dL (ref 70–99)
Potassium: 4.2 mmol/L (ref 3.5–5.1)
Sodium: 141 mmol/L (ref 135–145)
Total Bilirubin: 0.6 mg/dL (ref 0.3–1.2)
Total Protein: 6.9 g/dL (ref 6.5–8.1)

## 2022-10-05 NOTE — Patient Instructions (Signed)

## 2022-10-05 NOTE — Therapy (Signed)
OUTPATIENT PHYSICAL THERAPY FEMALE PELVIC TREATMENT   Patient Name: [name] MRN: [id number] DOB:11-11-1970, 52 y.o., female Today's Date: 10/05/2022  END OF SESSION:  PT End of Session - 10/05/22 1239     Visit Number 4    Date for PT Re-Evaluation 01/18/23    Authorization Type [employer]    PT Start Time 1233    PT Stop Time 1313    PT Time Calculation (min) 40 min    Activity Tolerance Patient tolerated treatment well    Behavior During Therapy [facility] for tasks assessed/performed                Past Medical History:  Diagnosis Date   Abdominal pain, epigastric 11/20/2019   Asthma    as a child   Cervical dysplasia    Gastroesophageal reflux disease 11/20/2019   Genetic testing 02/01/2018   Genetic testing was performed in 2015. The [employer] OvaNext panel was ordered. In total, 23 genes were analyzed as part of this panel: ATM, BARD1, BRCA1, BRCA2, BRIP1, CDH1, CHEK2, EPCAM, MLH1, MRE11A, MSH2, MSH6, MUTYH, NBN, NF1, PALB2, PMS2, PTEN, RAD50, RAD51C, RAD51D, STK11, and TP53. No pathogenic variants were detected. Genetic testing did detect a Variant of Unknown Significance (VUS) at the t   GERD (gastroesophageal reflux disease)    had during chemo   Headache 06/12/2013   History of chemotherapy    History of kidney stones    History of radiation therapy 11/24/13- 01/08/14   reconstructed right breast/chest wall 4500 cGy 25 sessions, electron beam boost 1440 cGy 8 sessions   Liver masses 12/06/2016   Major depressive disorder, in remission 03/12/2017   Malignant neoplasm of lower-inner quadrant of right breast of female, estrogen receptor positive 2012   Pneumonia    PONV (postoperative nausea and vomiting)    Recurrent cancer of right breast 12/22/2013   Thrombocytopenia 05/22/2013   Past Surgical History:  Procedure Laterality Date   ADENOIDECTOMY     BREAST LUMPECTOMY WITH NEEDLE LOCALIZATION Right 05/05/2013   Procedure: EXCISION RECURRENT CANCER RIGHT BREAST WITH  NEEDLE LOCALOZATION;  Surgeon: [name], MD;  Location: [facility] OR;  Service: General;  Laterality: Right;   BREAST RECONSTRUCTION  06/29/2011   Procedure: BREAST RECONSTRUCTION;  Surgeon: Wayland Denis, DO;  Location: [facility] OR;  Service: Plastics;  Laterality: Bilateral;  Immediate Bilateral Breast Reconstruction with Bilateral Tissue Expanders and placement of  Alloderm    DILATION AND CURETTAGE OF UTERUS     fibroid tumor surgery     lazy eye     corrective surgery   MASTECTOMY W/ SENTINEL NODE BIOPSY  06/29/2011   Procedure: MASTECTOMY WITH SENTINEL LYMPH NODE BIOPSY;  Surgeon: [name], MD;  Location: [facility] OR;  Service: General;  Laterality: Right;  right skin spairing mstectomy and right sentinel lymph node biopsy   PORT-A-CATH REMOVAL  03/12/2012   Procedure: MINOR REMOVAL PORT-A-CATH;  Surgeon: [name], MD;  Location: [facility];  Service: General;  Laterality: N/A;  Upper left   PORTACATH PLACEMENT Left 05/05/2013   Procedure: INSERTION PORT-A-CATH;  Surgeon: [name], MD;  Location: [facility] OR;  Service: General;  Laterality: Left;   RHINOPLASTY     uterine cervical treatments  2002   for precancerous lesions   Patient Active Problem List   Diagnosis Date Noted   Constipation 05/25/2022   Other fatigue 05/25/2022   Dizziness 05/25/2022   Endometrial thickening on ultrasound 05/24/2022   Abnormal laboratory test result  04/25/2022   Snoring 07/25/2021   Excessive daytime sleepiness 07/25/2021   Routine general medical examination at a health care facility 07/08/2021   Positive ANA (antinuclear antibody) 03/18/2021   Left leg pain 03/18/2021   Arthralgia 03/11/2021   Back pain 02/26/2021   Neck pain 02/26/2021   Gastroesophageal reflux disease 11/20/2019   Major depressive disorder, in remission 03/12/2017   Liver masses 12/06/2016   Recurrent cancer of right breast 12/22/2013   Headache 06/12/2013   Thrombocytopenia 05/22/2013   Malignant  neoplasm of lower-inner quadrant of right breast of female, estrogen receptor positive 2012    PCP: Myrlene Broker, MD  REFERRING PROVIDER: Napoleon Form, MD   REFERRING DIAG: M62.89 (ICD-10-CM) - Pelvic floor dysfunction  THERAPY DIAG:  Muscle weakness (generalized)  Other muscle spasm  Unspecified lack of coordination  Constipation, unspecified constipation type  Rationale for Evaluation and Treatment: Rehabilitation  ONSET DATE: 1 year  SUBJECTIVE:                                                                                                                                                                                           SUBJECTIVE STATEMENT: Pt has had to take linzess and senokot several times due to constipation causing severe low back pain. Once she stops taking these medications, she is back to having no bowel movements. She did have small amount of bowel movement come out yesterday and today. She is working on going to the bathroom to create opportunity of bowel movement. She is eating a lot of fiber. She has not started taking fiber supplement. She does feel like she has seen some improvement in general with using squatty potty, balloon breathing, bowel massage, and exercises.   Fluid intake: Yes: drinks a lot of water    PAIN:  Are you having pain? Yes NPRS scale: 4/10 Pain location:  low back pain  Pain type: aching Pain description: intermittent and constant   Aggravating factors: prolonged sitting, constipation Relieving factors: movement  PRECAUTIONS: None  WEIGHT BEARING RESTRICTIONS: No  FALLS:  Has patient fallen in last 6 months? 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, bil mastectomy,  osteopenia, fibroid removal in 2004   Sexual abuse: No  BOWEL MOVEMENT: Pain with bowel movement: No, but have been in the past, sometimes will throw up during bowel movement Type of bowel movement:Frequency every 1-2 weeks, Strain No, and Splinting no  Fully empty rectum: No Leakage: No Pads: No Fiber supplement: No, but tries to eat fiber rich foods; she does over the counter stool softeners, sometimes Miralax Feels like she does not get appropriate urgency to have a bowel movement  URINATION: Pain with urination: No Fully empty bladder: Yes: - Stream:  start and stop Urgency: Yes: but no leaking Frequency: will vary on the day, bad days every 1-2 hours, on a good day every 2-3 hours Leakage:  none Pads: No  INTERCOURSE: Pain with intercourse: Initial Penetration and During Penetration Ability to have vaginal penetration:  Yes: yes, pain and bleeding at posterior fourchette  Climax: sometimes    PREGNANCY: Vaginal deliveries 1 Tearing Yes: episiotomy   OBJECTIVE:  09/21/22: PALPATION:   General  Abdominal tenderness                External Perineal Exam WNL                             Internal Pelvic Floor WNL  Patient confirms identification and approves PT to assess internal pelvic floor and treatment Yes   PELVIC MMT:   MMT eval  Vaginal 3/5, 11 sec hold, 6 repeat  Internal Anal Sphincter 2/5  External Anal Sphincter 2/5  Puborectalis 2/5  Diastasis Recti WNL  (Blank rows = not tested)        TONE: WNL  PROLAPSE: Gr 1 anterior vaginal wall laxity, gr 1 posterior vaginal wall laxity 08/03/22: DIAGNOSTIC FINDINGS:  MRI, [place], CT scans all of abdomen and pelvis with demonstrating constipation  COGNITION: Overall cognitive status: Within functional limits for tasks assessed     SENSATION: Light touch: Appears intact Proprioception: Appears intact  GAIT: Comments: WNL  POSTURE:  Rt facing sacral location   LUMBARAROM/PROM:  A/PROM A/PROM  Eval  (% available)  Flexion 100  Extension 100  Right lateral flexion 100  Left lateral flexion 100  Right rotation 100  Left rotation 100   (Blank rows = not tested)      TODAY'S TREATMENT:                                                                                                                              DATE:  10/05/22 Manual: Trigger Point Dry-Needling  Treatment instructions: Expect mild to moderate muscle soreness. S/S of pneumothorax if dry needled over a lung field, and to seek immediate medical attention should they occur. Patient verbalized understanding of these instructions and education.  Patient Consent Given: Yes Education handout provided: Yes Muscles treated: T12-L5 multifidi, bil glutes Electrical stimulation performed: No Parameters: N/A Treatment response/outcome: twitch response/release Soft tissue mobilization to lumbar paraspinals Myofascial release to thoracolumbar fascia   09/26/22 Neuromuscular re-education: Transversus abdominus training with multimodal cues for improved motor control and breath coordination Bil supine UE ball press 12x Side lying UE ball press 10x bil Hip adduction ball press 12x  Exercises: Lower trunk rotation 2 x 10 Cat cow 2 x 10 Child's pose 10 breaths Happy baby 10 breaths   09/21/22 Manual Pt provides verbal consent for internal vaginal/rectal pelvic floor exam. Internal rectal and vaginal pelvic floor muscle exam Neuromuscular re-education: Pelvic floor muscle contraction training Quick flicks Pelvic floor muscle bulge training with balloon pushing Therapeutic activities: Fiber supplement Strict bowel retraining Bowel massage    PATIENT EDUCATION:  Education details: See above Person educated: Patient Education method: Explanation, Demonstration, Tactile cues, Verbal cues, and Handouts Education comprehension: verbalized understanding  HOME EXERCISE PROGRAM: [id number]  ASSESSMENT:  CLINICAL  IMPRESSION: Pt does feel like she is beginning to some some progress with increased frequency/ease of bowel movements. Believe that low back pain back to contributing constipation, and we focused treatment today to deep lumbar paraspinals, including thoracolumbar junction. Good tolerance to all techniques with decreased restriction at end of session. She will continue to benefit from skilled PT intervention in order to improve frequency and ease of bowel movements, decrease low back pain, implement functional strengthening program for core and pelvic floor with appropriate pressure management, and decrease dyspareunia.   OBJECTIVE IMPAIRMENTS: decreased activity tolerance, decreased coordination, decreased endurance, decreased strength, increased fascial restrictions, increased muscle spasms, impaired tone, postural dysfunction, and pain.   ACTIVITY LIMITATIONS:  bowel movements   PARTICIPATION LIMITATIONS:  functional activities  PERSONAL FACTORS: 1 comorbidity: medical history  are also affecting patient's functional outcome.   REHAB POTENTIAL: Good  CLINICAL DECISION MAKING: Stable/uncomplicated  EVALUATION COMPLEXITY: Low   GOALS: Goals reviewed with patient? Yes  SHORT TERM GOALS: Target date: 09/14/22 - updated 09/21/22  Pt will be independent with HEP.   Baseline: Goal status: IN PROGRESS  2.  Pt will be independent with use of squatty potty, relaxed toileting mechanics, and improved bowel movement techniques in order to increase ease of bowel movements and complete evacuation.   Baseline:  Goal status: IN PROGRESS  3.  Pt will be independent with diaphragmatic breathing and down training activities in order to improve pelvic floor relaxation.  Baseline:  Goal status: IN PROGRESS    LONG TERM GOALS: Target date: 01/18/2023 - updated 09/21/22  Pt will be independent with advanced HEP.   Baseline:  Goal status:IN PROGRESS  2.  Pt will increase frequency of bowel  movements to 3-4x/week in order to improve comfort.  Baseline:  Goal status: IN PROGRESS  3.  Pt will demonstrate normal pelvic floor muscle tone and A/ROM, able to achieve 4/5 strength with contractions and 10 sec endurance, in order to provide appropriate lumbopelvic support in functional activities.   Baseline:  Goal status:IN PROGRESS  4.  Pt will report low back pain no higher than 2/10. Baseline:  Goal status: IN PROGRESS   PLAN:  PT FREQUENCY: 1-2x/week  PT DURATION: 6 months  PLANNED INTERVENTIONS: Therapeutic exercises, Therapeutic activity, Neuromuscular re-education, Balance training, Gait training, Patient/Family education, Self Care, Joint mobilization, Dry Needling, Biofeedback, and Manual therapy  PLAN FOR NEXT SESSION: Continue to address thoracolumbar restriction; myofascial stretch release     Julio Alm, PT, DPT06/27/2412:40 PM

## 2022-10-06 LAB — CANCER ANTIGEN 27.29: CA 27.29: 29.3 U/mL (ref 0.0–38.6)

## 2022-10-06 NOTE — Progress Notes (Deleted)
NEW PATIENT Date of Service/Encounter:  10/06/22 Referring provider: Napoleon Form, MD Primary care provider: Myrlene Broker, MD  Subjective:  [name] is a 52 y.o. female with a PMHx of stage Ia right breast cancer presenting today for evaluation of *** History obtained from: chart review and {Persons; PED relatives w/patient:[id number]::"patient"}.   Chart Review:  GI visit 06/19/22: "Celiac and alpha gal allergy negative.  Patient is worried about potential food and environmental allergies. Will refer to allergy specialist for testing followed by referral to nutritionist if needed"  Other allergy screening: Asthma: {Blank single:19197::"yes","no"} Rhino conjunctivitis: {Blank single:19197::"yes","no"} Food allergy: {Blank single:19197::"yes","no"} Medication allergy: {Blank single:19197::"yes","no"} Hymenoptera allergy: {Blank single:19197::"yes","no"} Urticaria: {Blank single:19197::"yes","no"} Eczema:{Blank single:19197::"yes","no"} History of recurrent infections suggestive of immunodeficency: {Blank single:19197::"yes","no"} ***Vaccinations are up to date.   Past Medical History: Past Medical History:  Diagnosis Date   Abdominal pain, epigastric 11/20/2019   Asthma    as a child   Cervical dysplasia    Gastroesophageal reflux disease 11/20/2019   Genetic testing 02/01/2018   Genetic testing was performed in 2015. The [employer] OvaNext panel was ordered. In total, 23 genes were analyzed as part of this panel: ATM, BARD1, BRCA1, BRCA2, BRIP1, CDH1, CHEK2, EPCAM, MLH1, MRE11A, MSH2, MSH6, MUTYH, NBN, NF1, PALB2, PMS2, PTEN, RAD50, RAD51C, RAD51D, STK11, and TP53. No pathogenic variants were detected. Genetic testing did detect a Variant of Unknown Significance (VUS) at the t   GERD (gastroesophageal reflux disease)    had during chemo   Headache 06/12/2013   History of chemotherapy    History of kidney stones    History of radiation therapy 11/24/13- 01/08/14    reconstructed right breast/chest wall 4500 cGy 25 sessions, electron beam boost 1440 cGy 8 sessions   Liver masses 12/06/2016   Major depressive disorder, in remission 03/12/2017   Malignant neoplasm of lower-inner quadrant of right breast of female, estrogen receptor positive 2012   Pneumonia    PONV (postoperative nausea and vomiting)    Recurrent cancer of right breast 12/22/2013   Thrombocytopenia 05/22/2013   Medication List:  Current Outpatient Medications  Medication Sig Dispense Refill   lubiprostone (AMITIZA) 24 MCG capsule Take 1 capsule (24 mcg total) by mouth 2 (two) times daily with a meal. 60 capsule 3   Acetaminophen-Caffeine (EXCEDRIN TENSION HEADACHE PO) Take by mouth.     Biotin 5000 MCG CAPS Take 5,000 mcg by mouth daily.     Black Pepper-Turmeric (TURMERIC CURCUMIN) 08-998 MG CAPS Take by mouth 2 (two) times daily.     Coenzyme Q10 (COQ10) 150 MG CAPS Take 1 capsule by mouth once.     Digestive Enzyme CAPS Take by mouth.     DULoxetine (CYMBALTA) 30 MG capsule Take 1 capsule (30 mg total) by mouth daily. (Patient not taking: Reported on 08/24/2022) 30 capsule 2   ibuprofen (ADVIL) 800 MG tablet TAKE 1 TABLET BY MOUTH EVERY 8 HOURS AS NEEDED 30 tablet 5   LORazepam (ATIVAN) 0.5 MG tablet Take 1 tablet (0.5 mg total) by mouth daily as needed for anxiety. (Patient not taking: Reported on 08/24/2022) 15 tablet 0   Plecanatide (TRULANCE) 3 MG TABS Take one tablet daily 90 tablet 3   Prenat-Fe Poly-Methfol-FA-DHA (VITAFOL FE+) 90-0.6-0.4-200 MG CAPS Take 1 tablet by mouth daily. 30 capsule 11   Probiotic Product (PROBIOTIC DAILY) CAPS Take by mouth.     No current facility-administered medications for this visit.   Known Allergies:  Allergies  Allergen Reactions  Tussionex Pennkinetic Er [Hydrocod Poli-Chlorphe Poli Er] Other (See Comments)    hallucinations   Morphine Rash   Morphine And Codeine Anxiety    anxiety   Penicillin G Rash   Penicillins Rash   Past Surgical  History: Past Surgical History:  Procedure Laterality Date   ADENOIDECTOMY     BREAST LUMPECTOMY WITH NEEDLE LOCALIZATION Right 05/05/2013   Procedure: EXCISION RECURRENT CANCER RIGHT BREAST WITH NEEDLE LOCALOZATION;  Surgeon: [name], MD;  Location: [facility] OR;  Service: General;  Laterality: Right;   BREAST RECONSTRUCTION  06/29/2011   Procedure: BREAST RECONSTRUCTION;  Surgeon: Wayland Denis, DO;  Location: [facility] OR;  Service: Plastics;  Laterality: Bilateral;  Immediate Bilateral Breast Reconstruction with Bilateral Tissue Expanders and placement of  Alloderm    DILATION AND CURETTAGE OF UTERUS     fibroid tumor surgery     lazy eye     corrective surgery   MASTECTOMY W/ SENTINEL NODE BIOPSY  06/29/2011   Procedure: MASTECTOMY WITH SENTINEL LYMPH NODE BIOPSY;  Surgeon: [name], MD;  Location: [facility] OR;  Service: General;  Laterality: Right;  right skin spairing mstectomy and right sentinel lymph node biopsy   PORT-A-CATH REMOVAL  03/12/2012   Procedure: MINOR REMOVAL PORT-A-CATH;  Surgeon: [name], MD;  Location: [facility];  Service: General;  Laterality: N/A;  Upper left   PORTACATH PLACEMENT Left 05/05/2013   Procedure: INSERTION PORT-A-CATH;  Surgeon: [name], MD;  Location: [facility];  Service: General;  Laterality: Left;   RHINOPLASTY     uterine cervical treatments  2002   for precancerous lesions   Family History: Family History  Adopted: Yes  Problem Relation Age of Onset   Colon cancer Mother 63   Rectal cancer Mother    Cervical cancer Mother    Heart attack Father    Cancer Maternal Aunt 43       stomach and ovarian as separate primaries   Cancer Maternal Aunt 55       breast   Stomach cancer Maternal Aunt    Cancer Maternal Uncle 65       lung; smoker   Cancer Maternal Uncle 63       prostate   Cancer Maternal Grandmother 75       enodometrial   Cancer Cousin 40       melanoma; mat first cousin, located on neck   Social  History: [name] lives ***.   ROS:  All other systems negative except as noted per HPI.  Objective:  Last menstrual period 03/11/2011. There is no height or weight on file to calculate BMI. Physical Exam:  General Appearance:  Alert, cooperative, no distress, appears stated age  Head:  Normocephalic, without obvious abnormality, atraumatic  Eyes:  Conjunctiva clear, EOM's intact  Nose: Nares normal, {Blank multiple:19196:a:"***","hypertrophic turbinates","normal mucosa","no visible anterior polyps","septum midline"}  Throat: Lips, tongue normal; teeth and gums normal, {Blank multiple:19196:a:"***","normal posterior oropharynx","tonsils 2+","tonsils 3+","no tonsillar exudate","+ cobblestoning","surgically absent tonsils","mildly erythematous posterior oropharynx"}  Neck: Supple, symmetrical  Lungs:   {Blank multiple:19196:a:"***","clear to auscultation bilaterally","end-expiratory wheezing","wheezing throughout"}, Respirations unlabored, {Blank multiple:19196:a:"***","no coughing","intermittent dry coughing","intermittent productive-sounding cough"}  Heart:  {Blank multiple:19196:a:"***","regular rate and rhythm","no murmur"}, Appears well perfused  Extremities: No edema  Skin: {Blank multiple:19196:a:"***","erythematous, dry patches scattered on ***","lichenification on ***","Skin color, texture, turgor normal","no rashes or lesions on visualized portions of skin"}  Neurologic: No gross deficits     Diagnostics: Spirometry:  Tracings reviewed. Her effort: {Blank single:19197::"Good reproducible efforts.","It  was hard to get consistent efforts and there is a question as to whether this reflects a maximal maneuver.","Poor effort, data can not be interpreted.","Variable effort-results affected","effort okay for first attempt at spirometry.","Results not reproducible due to ***"} FVC: ***L (pre), ***L  (post) FEV1: ***L, ***% predicted (pre), ***L, ***% predicted (post) FEV1/FVC ratio: ***  (pre), *** (post) Interpretation: {Blank single:19197::"Spirometry consistent with mild obstructive disease","Spirometry consistent with moderate obstructive disease","Spirometry consistent with severe obstructive disease","Spirometry consistent with possible restrictive disease","Spirometry consistent with mixed obstructive and restrictive disease","Spirometry uninterpretable due to technique","Spirometry consistent with normal pattern","No overt abnormalities noted given today's efforts","Nonobstructive ratio, low FEV1","Nonobstructive ratio, low FEV1, possible restriction"}.  Please see scanned spirometry results for details.  Skin Testing: {Blank single:19197::"Select foods","Environmental allergy panel","Environmental allergy panel and select foods","Food allergy panel","None","Deferred due to recent antihistamines use","deferred due to recent reaction","Pediatric Environmental Allergy Panel","Pediatric Food Panel","Select foods and environmental allergies"}. {Blank single:19197::"Adequate positive and negative controls","Inadequate positive control-testing invalid","Adequate positive and negative controls, dermatographism present, testing difficult to interpret"}. Results discussed with patient/family.   {Blank single:19197::"Allergy testing results were read and interpreted by myself, documented by clinical staff.","Allergy testing results were read by ***,FNP, documented by clinical staff"}  Assessment and Plan  ***  {Blank single:19197::"This note in its entirety was forwarded to the Provider who requested this consultation."}  Thank you for your kind referral. I appreciate the opportunity to take part in [name]'s care. Please do not hesitate to contact me with questions.***  Sincerely,  Tonny Bollman, MD [facility]

## 2022-10-07 LAB — FOLLICLE STIMULATING HORMONE: FSH: 89.4 m[IU]/mL

## 2022-10-07 LAB — TESTOSTERONE: Testosterone: <3 ng/dL — ABNORMAL LOW (ref 4–50)

## 2022-10-07 LAB — ESTRADIOL: Estradiol: <5 pg/mL

## 2022-10-09 ENCOUNTER — Encounter: Payer: Self-pay | Admitting: Adult Health

## 2022-10-09 ENCOUNTER — Other Ambulatory Visit: Payer: Self-pay

## 2022-10-09 ENCOUNTER — Ambulatory Visit: Payer: Commercial Managed Care - HMO | Admitting: Internal Medicine

## 2022-10-09 ENCOUNTER — Inpatient Hospital Stay: Payer: Commercial Managed Care - HMO | Attending: Adult Health | Admitting: Adult Health

## 2022-10-09 ENCOUNTER — Inpatient Hospital Stay: Payer: Commercial Managed Care - HMO

## 2022-10-09 ENCOUNTER — Encounter: Payer: Self-pay | Admitting: Genetic Counselor

## 2022-10-09 VITALS — BP 99/77 | HR 70 | Temp 97.7°F | Resp 16 | Wt 124.9 lb

## 2022-10-09 DIAGNOSIS — Z08 Encounter for follow-up examination after completed treatment for malignant neoplasm: Secondary | ICD-10-CM | POA: Diagnosis present

## 2022-10-09 DIAGNOSIS — C50311 Malignant neoplasm of lower-inner quadrant of right female breast: Secondary | ICD-10-CM

## 2022-10-09 DIAGNOSIS — Z853 Personal history of malignant neoplasm of breast: Secondary | ICD-10-CM | POA: Diagnosis present

## 2022-10-09 DIAGNOSIS — Z170 Estrogen receptor positive status [ER+]: Secondary | ICD-10-CM

## 2022-10-09 LAB — VITAMIN D 25 HYDROXY (VIT D DEFICIENCY, FRACTURES): Vit D, 25-Hydroxy: 97.98 ng/mL (ref 30–100)

## 2022-10-09 LAB — ESTRADIOL, ULTRA SENS: Estradiol, Sensitive: <2.5 pg/mL

## 2022-10-09 LAB — IRON AND IRON BINDING CAPACITY (CC-WL,HP ONLY)
Iron: 105 ug/dL (ref 28–170)
Saturation Ratios: 31 % (ref 10.4–31.8)
TIBC: 337 ug/dL (ref 250–450)
UIBC: 232 ug/dL (ref 148–442)

## 2022-10-09 LAB — FERRITIN: Ferritin: 47 ng/mL (ref 11–307)

## 2022-10-09 LAB — CORTISOL: Cortisol, Plasma: 8.2 ug/dL

## 2022-10-09 LAB — DHEA-SULFATE: DHEA-SO4: 37.2 ug/dL — ABNORMAL LOW (ref 41.2–243.7)

## 2022-10-09 LAB — TSH: TSH: 1.168 u[IU]/mL (ref 0.350–4.500)

## 2022-10-09 NOTE — Assessment & Plan Note (Signed)
[name] is a 52 year old woman with history of recurrent breast cancer here today for follow-up and evaluation.  She has no clinical or radiographic signs of breast cancer recurrence.  I reviewed her labs that have completed thus far and they are all normal.  We reviewed this in detail.  She did undergo an MRI of of her abdomen and she was confused about some of the terminology.  I had previously discussed this with my friend and colleague Dr. Allena Katz and he was going to ask the radiologist to read it to call the patient.  [name] does not remember receiving a phone call however she says that she typically does not answer if she does not know the number who is calling her.  We will reach back out to the radiology reading room and see if the radiologist can give her a call sometime in the next day or 2.  [name] will continue to follow-up withher PCP along with the other specialists on her health care team.  I updated this today.  I recommended continue healthy diet and exercise.  [name] will return in 1 year for labs and f/u.  She knows to call for any questions or concerns that may arise between now and her next appointment with [place].

## 2022-10-09 NOTE — Progress Notes (Signed)
[facility] Cancer Follow up:    Myrlene Broker, MD [address] [name] [address]   DIAGNOSIS:  Cancer Staging  Malignant neoplasm of lower-inner quadrant of right breast of female, estrogen receptor positive Staging form: Breast, AJCC 7th Edition - Clinical: Stage IA (T1c, N0, M0) - Signed by Lowella Dell, MD on 03/30/2015  Recurrent cancer of right breast Staging form: Breast, AJCC 7th Edition - Clinical: Stage IA (T1b, N0, M0) - Signed by Lowella Dell, MD on 03/30/2015   SUMMARY OF ONCOLOGIC HISTORY:  (1) status post right breast lower inner quadrant biopsy in September 2012 for a clinically 2.0 cm invasive ductal carcinoma, with some enlarged Right axillary lymph nodes, the largest one of which was negative by biopsy. The tumor was grade 2, estrogen receptor positive at 88%, progesterone receptor positive at 11%, HER-2/neu positive by CISH with a ratio of 4.61, and a proliferation marker of 36%.    (2) treated in the neoadjuvant setting with Q 3 week docetaxel/ carboplatin/ trastuzumab x6, completed 05/18/2011.  Continued on trastuzumab every 3 weeks to complete one year, last dose in November 2013.   (3) s/p bilateral mastectomies with sentinel node biopsy 06/29/2011 for a residual right-sided ypT1c ypN0 invasive ductal carcinoma, grade 2, with immediate expander placement   (4) on tamoxifen as of May 2013, discontinued in early 2015 due to breast cancer recurrence    RECURRENT BREAST CANCER January 2015 (5) status post right lumpectomy with right-sided sentinel lymph node sampling 05/05/2013 for a pT1b cN0, stage IA invasive ductal carcinoma, estrogen receptor 94% positive, progesterone receptor 11% positive, with an MIB-1 of 19% and HER-2 amplification   (6)  Received TDM-1 every 3 weeks x 8 doses completed 10/09/2013; echo 08/25/2013 showed a well preserved ejection fraction   (7) adjuvant radiation therapy completed 01/08/2014    (8) started anastrozole 04/10/2014              (a) FSH and estradiol in menopausal range 02/04/2014, to be followed every 3 months             (b) bone density 01/15/2013 showed a T score of -1.1             (c) switched to letrozole 07/07/2014, discontinued June 2016 because of arthralgias/myalgias             (d) started exemestane September 2016, stopped March 2017 because of cost issues             (e) bone density 06/17/2015 showed a T score of -1.5              (f) anastrozole resumed April 2017, discontinued by the patient sometime around April 2018   (9) genetic testing (23 genes associated with hereditary ovarian cancer through [employer], April 2015) 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  Updated as likely benign. There were no deleterious mutations noted  CURRENT THERAPY: observation  INTERVAL HISTORY: [name] 52 y.o. female returns for follow-up of her history of breast cancer.  She is doing well today.  She has no new concerns today.  She has previously seen Dr. Dimple Casey in rheumatology who did some lab work on her regarding an elevated ANA and she has follow-up with him in a few months.  She also continues to see Dr. Everlena Cooper in neurology and has an upcoming appointment with Dr. Dellis Anes and allergy/immunology.  She is overall feeling  well.  She has no breast concerns today.  She has not heard when she should follow-up with Dr. Clearance Coots.   Patient Active Problem List   Diagnosis Date Noted   Constipation 05/25/2022   Other fatigue 05/25/2022   Dizziness 05/25/2022   Endometrial thickening on ultrasound 05/24/2022   Abnormal laboratory test result 04/25/2022   Snoring 07/25/2021   Excessive daytime sleepiness 07/25/2021   Routine general medical examination at a health care facility 07/08/2021   Positive ANA (antinuclear antibody) 03/18/2021   Left leg pain 03/18/2021   Arthralgia 03/11/2021   Back pain 02/26/2021   Neck  pain 02/26/2021   Gastroesophageal reflux disease 11/20/2019   Genetic testing 02/01/2018   Major depressive disorder, in remission 03/12/2017   Liver masses 12/06/2016   Recurrent cancer of right breast 12/22/2013   Headache 06/12/2013   Thrombocytopenia 05/22/2013   Malignant neoplasm of lower-inner quadrant of right breast of female, estrogen receptor positive 2012    is allergic to tussionex pennkinetic er [hydrocod poli-chlorphe poli er], morphine, morphine and codeine, penicillin g, and penicillins.  MEDICAL HISTORY: Past Medical History:  Diagnosis Date   Abdominal pain, epigastric 11/20/2019   Asthma    as a child   Cervical dysplasia    Gastroesophageal reflux disease 11/20/2019   Genetic testing 02/01/2018   Genetic testing was performed in 2015. The [employer] panel was ordered. In total, 23 genes were analyzed as part of this panel: ATM, BARD1, BRCA1, BRCA2, BRIP1, CDH1, CHEK2, EPCAM, MLH1, MRE11A, MSH2, MSH6, MUTYH, NBN, NF1, PALB2, PMS2, PTEN, RAD50, RAD51C, RAD51D, STK11, and TP53. No pathogenic variants were detected. Genetic testing did detect a Variant of Unknown Significance (VUS) at the t   GERD (gastroesophageal reflux disease)    had during chemo   Headache 06/12/2013   History of chemotherapy    History of kidney stones    History of radiation therapy 11/24/13- 01/08/14   reconstructed right breast/chest wall 4500 cGy 25 sessions, electron beam boost 1440 cGy 8 sessions   Liver masses 12/06/2016   Major depressive disorder, in remission 03/12/2017   Malignant neoplasm of lower-inner quadrant of right breast of female, estrogen receptor positive 2012   Pneumonia    PONV (postoperative nausea and vomiting)    Recurrent cancer of right breast 12/22/2013   Thrombocytopenia 05/22/2013    SURGICAL HISTORY: Past Surgical History:  Procedure Laterality Date   ADENOIDECTOMY     BREAST LUMPECTOMY WITH NEEDLE LOCALIZATION Right 05/05/2013   Procedure: EXCISION  RECURRENT CANCER RIGHT BREAST WITH NEEDLE LOCALOZATION;  Surgeon: [name], MD;  Location: [facility] OR;  Service: General;  Laterality: Right;   BREAST RECONSTRUCTION  06/29/2011   Procedure: BREAST RECONSTRUCTION;  Surgeon: Wayland Denis, DO;  Location: [facility] OR;  Service: Plastics;  Laterality: Bilateral;  Immediate Bilateral Breast Reconstruction with Bilateral Tissue Expanders and placement of  Alloderm    DILATION AND CURETTAGE OF UTERUS     fibroid tumor surgery     lazy eye     corrective surgery   MASTECTOMY W/ SENTINEL NODE BIOPSY  06/29/2011   Procedure: MASTECTOMY WITH SENTINEL LYMPH NODE BIOPSY;  Surgeon: [name], MD;  Location: [facility] OR;  Service: General;  Laterality: Right;  right skin spairing mstectomy and right sentinel lymph node biopsy   PORT-A-CATH REMOVAL  03/12/2012   Procedure: MINOR REMOVAL PORT-A-CATH;  Surgeon: [name], MD;  Location: [facility];  Service: General;  Laterality: N/A;  Upper left  PORTACATH PLACEMENT Left 05/05/2013   Procedure: INSERTION PORT-A-CATH;  Surgeon: [name], MD;  Location: [facility];  Service: General;  Laterality: Left;   RHINOPLASTY     uterine cervical treatments  2002   for precancerous lesions    SOCIAL HISTORY: Social History   Socioeconomic History   Marital status: Married    Spouse name: Not on file   Number of children: Not on file   Years of education: 10   Highest education level: GED or equivalent  Occupational History   Not on file  Tobacco Use   Smoking status: Former    Packs/day: 1.00    Years: 4.00    Additional pack years: 0.00    Total pack years: 4.00    Types: Cigarettes    Quit date: 2000    Years since quitting: 24.5    Passive exposure: Past   Smokeless tobacco: Never  Vaping Use   Vaping Use: Never used  Substance and Sexual Activity   Alcohol use: Not Currently    Alcohol/week: 0.0 standard drinks of alcohol   Drug use: No   Sexual activity: Yes    Partners:  Male  Other Topics Concern   Not on file  Social History Narrative   She was adopted by her grandparents as a child.  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  She works in a Agricultural consultant.right handed   Two story home    Drinks caffeine occasional   Right handed   Social Determinants of Health   Financial Resource Strain: Not on file  Food Insecurity: Not on file  Transportation Needs: Not on file  Physical Activity: Not on file  Stress: Not on file  Social Connections: Not on file  Intimate Partner Violence: Not on file    FAMILY HISTORY: Family History  Adopted: Yes  Problem Relation Age of Onset   Colon cancer Mother 67   Rectal cancer Mother    Cervical cancer Mother    Heart attack Father    Cancer Maternal Aunt 71       stomach and ovarian as separate primaries   Cancer Maternal Aunt 40       breast   Stomach cancer Maternal Aunt    Cancer Maternal Uncle 24       lung; smoker   Cancer Maternal Uncle 63       prostate   Cancer Maternal Grandmother 35       enodometrial   Cancer Cousin 40       melanoma; mat first cousin, located on neck    Review of Systems  Constitutional:  Negative for appetite change, chills, fatigue, fever and unexpected weight change.  HENT:   Negative for hearing loss, lump/mass and trouble swallowing.   Eyes:  Negative for eye problems and icterus.  Respiratory:  Negative for chest tightness, cough and shortness of breath.   Cardiovascular:  Negative for chest pain, leg swelling and palpitations.  Gastrointestinal:  Negative for abdominal distention, abdominal pain, constipation, diarrhea, nausea and vomiting.  Endocrine: Negative for hot flashes.  Genitourinary:  Negative for difficulty urinating.   Musculoskeletal:  Negative for arthralgias.  Skin:  Negative for itching and rash.  Neurological:  Negative for dizziness, extremity weakness, headaches and numbness.  Hematological:  Negative for  adenopathy. Does not bruise/bleed easily.  Psychiatric/Behavioral:  Negative for depression. The patient is not nervous/anxious.       PHYSICAL EXAMINATION  Vitals:   10/09/22 1149  BP: 99/77  Pulse: 70  Resp: 16  Temp: 97.7 F (36.5 C)  SpO2: 99%    Physical Exam Constitutional:      General: She is not in acute distress.    Appearance: Normal appearance. She is not toxic-appearing.  HENT:     Head: Normocephalic and atraumatic.     Mouth/Throat:     Mouth: Mucous membranes are moist.     Pharynx: Oropharynx is clear. No oropharyngeal exudate or posterior oropharyngeal erythema.  Eyes:     General: No scleral icterus. Cardiovascular:     Rate and Rhythm: Normal rate and regular rhythm.     Pulses: Normal pulses.     Heart sounds: Normal heart sounds.  Pulmonary:     Effort: Pulmonary effort is normal.     Breath sounds: Normal breath sounds.  Chest:     Comments: S/p bialteral mastectomies and reconstruction, no sign of local recurrence Abdominal:     General: Abdomen is flat. Bowel sounds are normal. There is no distension.     Palpations: Abdomen is soft.     Tenderness: There is no abdominal tenderness.  Musculoskeletal:        General: No swelling.     Cervical back: Neck supple.  Lymphadenopathy:     Cervical: No cervical adenopathy.  Skin:    General: Skin is warm and dry.     Findings: No rash.  Neurological:     General: No focal deficit present.     Mental Status: She is alert.  Psychiatric:        Mood and Affect: Mood normal.        Behavior: Behavior normal.     LABORATORY DATA:  CBC    Component Value Date/Time   WBC 6.7 10/05/2022 1449   WBC 7.2 02/25/2021 1612   RBC 4.22 10/05/2022 1449   HGB 13.0 10/05/2022 1449   HGB 13.4 02/23/2017 1356   HCT 39.5 10/05/2022 1449   HCT 40.9 02/23/2017 1356   PLT 273 10/05/2022 1449   PLT 259 02/23/2017 1356   PLT 249 08/02/2016 1437   MCV 93.6 10/05/2022 1449   MCV 92.8 02/23/2017 1356    MCH 30.8 10/05/2022 1449   MCHC 32.9 10/05/2022 1449   RDW 12.3 10/05/2022 1449   RDW 12.9 02/23/2017 1356   LYMPHSABS 1.7 10/05/2022 1449   LYMPHSABS 1.5 02/23/2017 1356   MONOABS 0.4 10/05/2022 1449   MONOABS 0.4 02/23/2017 1356   EOSABS 0.2 10/05/2022 1449   EOSABS 0.1 02/23/2017 1356   BASOSABS 0.0 10/05/2022 1449   BASOSABS 0.0 02/23/2017 1356    CMP     Component Value Date/Time   NA 141 10/05/2022 1449   NA 142 02/23/2017 1356   K 4.2 10/05/2022 1449   K 4.2 02/23/2017 1356   CL 105 10/05/2022 1449   CL 104 09/23/2012 1415   CO2 30 10/05/2022 1449   CO2 27 02/23/2017 1356   GLUCOSE 76 10/05/2022 1449   GLUCOSE 81 02/23/2017 1356   GLUCOSE 96 09/23/2012 1415   BUN 10 10/05/2022 1449   BUN 11.2 02/23/2017 1356   CREATININE 0.80 10/05/2022 1449   CREATININE 0.9 02/23/2017 1356   CALCIUM 9.8 10/05/2022 1449   CALCIUM 9.9 02/23/2017 1356   PROT 6.9 10/05/2022 1449   PROT 7.2 02/23/2017 1356   ALBUMIN 4.4 10/05/2022 1449   ALBUMIN 4.2 02/23/2017 1356   AST 21 10/05/2022 1449   AST 25 02/23/2017  1356   ALT 20 10/05/2022 1449   ALT 19 02/23/2017 1356   ALKPHOS 83 10/05/2022 1449   ALKPHOS 91 02/23/2017 1356   BILITOT 0.6 10/05/2022 1449   BILITOT 0.49 02/23/2017 1356   GFRNONAA >60 10/05/2022 1449   GFRAA >60 08/11/2019 1204    ASSESSMENT and THERAPY PLAN:   Malignant neoplasm of lower-inner quadrant of right breast of female, estrogen receptor positive [name] is a 52 year old woman with history of recurrent breast cancer here today for follow-up and evaluation.  She has no clinical or radiographic signs of breast cancer recurrence.  I reviewed her labs that have completed thus far and they are all normal.  We reviewed this in detail.  She did undergo an MRI of of her abdomen and she was confused about some of the terminology.  I had previously discussed this with my friend and colleague Dr. Allena Katz and he was going to ask the radiologist to read it to call  the patient.  Melisse does not remember receiving a phone call however she says that she typically does not answer if she does not know the number who is calling her.  We will reach back out to the radiology reading room and see if the radiologist can give her a call sometime in the next day or 2.  Pietra will continue to follow-up withher PCP along with the other specialists on her health care team.  I updated this today.  I recommended continue healthy diet and exercise.  Railyn will return in 1 year for labs and f/u.  She knows to call for any questions or concerns that may arise between now and her next appointment with [place].   All questions were answered. The patient knows to call the clinic with any problems, questions or concerns. We can certainly see the patient much sooner if necessary.  Total encounter time:30 minutes*in face-to-face visit time, chart review, lab review, care coordination, order entry, and documentation of the encounter time.  Lillard Anes, NP 10/09/22 1:25 PM Medical Oncology and Hematology [facility] [address] [place], [name] [address] Tel. [phone]    Fax. [phone]  *Total Encounter Time as defined by the Centers for Medicare and Medicaid Services includes, in addition to the face-to-face time of a patient visit (documented in the note above) non-face-to-face time: obtaining and reviewing outside history, ordering and reviewing medications, tests or procedures, care coordination (communications with other health care professionals or caregivers) and documentation in the medical record.

## 2022-10-10 ENCOUNTER — Telehealth: Payer: Self-pay

## 2022-10-10 ENCOUNTER — Telehealth: Payer: Self-pay | Admitting: Adult Health

## 2022-10-10 NOTE — Telephone Encounter (Signed)
Scheduled appointment per 7/1 los. Patient is aware of the made appointments.

## 2022-10-10 NOTE — Telephone Encounter (Signed)
Notified pt of message below. Pt verbalized understanding.

## 2022-10-10 NOTE — Telephone Encounter (Signed)
-----   Message from Loa Socks, NP sent at 10/10/2022  8:44 AM EDT ----- Please let [name] know the remainder of her labs are normal.  The DHEA is a little low, but this is just a variation and we will recheck it next year.  When you call her, please ask if she heard from radiology yet.   ----- Message ----- From: Leory Plowman, [facility] [place] Sent: 10/09/2022   2:01 PM EDT To: Loa Socks, NP

## 2022-10-19 ENCOUNTER — Ambulatory Visit: Payer: Commercial Managed Care - HMO | Attending: Gastroenterology

## 2022-10-19 DIAGNOSIS — R279 Unspecified lack of coordination: Secondary | ICD-10-CM | POA: Insufficient documentation

## 2022-10-19 DIAGNOSIS — M62838 Other muscle spasm: Secondary | ICD-10-CM | POA: Diagnosis present

## 2022-10-19 DIAGNOSIS — M5459 Other low back pain: Secondary | ICD-10-CM | POA: Diagnosis present

## 2022-10-19 DIAGNOSIS — K5900 Constipation, unspecified: Secondary | ICD-10-CM | POA: Insufficient documentation

## 2022-10-19 DIAGNOSIS — M6281 Muscle weakness (generalized): Secondary | ICD-10-CM | POA: Insufficient documentation

## 2022-10-19 NOTE — Therapy (Signed)
OUTPATIENT PHYSICAL THERAPY FEMALE PELVIC TREATMENT   Patient Name: [name] MRN: [id number] DOB:November 12, 1970, 52 y.o., female Today's Date: 10/19/2022  END OF SESSION:  PT End of Session - 10/19/22 1244     Visit Number 5    Date for PT Re-Evaluation 01/18/23    Authorization Type [employer]    PT Start Time 1243    PT Stop Time 1310    PT Time Calculation (min) 27 min    Activity Tolerance Patient tolerated treatment well    Behavior During Therapy [facility] for tasks assessed/performed                 Past Medical History:  Diagnosis Date   Abdominal pain, epigastric 11/20/2019   Asthma    as a child   Cervical dysplasia    Gastroesophageal reflux disease 11/20/2019   Genetic testing 02/01/2018   Genetic testing was performed in 2015. The [employer] panel was ordered. In total, 23 genes were analyzed as part of this panel: ATM, BARD1, BRCA1, BRCA2, BRIP1, CDH1, CHEK2, EPCAM, MLH1, MRE11A, MSH2, MSH6, MUTYH, NBN, NF1, PALB2, PMS2, PTEN, RAD50, RAD51C, RAD51D, STK11, and TP53. No pathogenic variants were detected. Genetic testing did detect a Variant of Unknown Significance (VUS) at the t   GERD (gastroesophageal reflux disease)    had during chemo   Headache 06/12/2013   History of chemotherapy    History of kidney stones    History of radiation therapy 11/24/13- 01/08/14   reconstructed right breast/chest wall 4500 cGy 25 sessions, electron beam boost 1440 cGy 8 sessions   Liver masses 12/06/2016   Major depressive disorder, in remission 03/12/2017   Malignant neoplasm of lower-inner quadrant of right breast of female, estrogen receptor positive 2012   Pneumonia    PONV (postoperative nausea and vomiting)    Recurrent cancer of right breast 12/22/2013   Thrombocytopenia 05/22/2013   Past Surgical History:  Procedure Laterality Date   ADENOIDECTOMY     BREAST LUMPECTOMY WITH NEEDLE LOCALIZATION Right 05/05/2013   Procedure: EXCISION RECURRENT CANCER RIGHT BREAST WITH  NEEDLE LOCALOZATION;  Surgeon: [name], MD;  Location: [facility] OR;  Service: General;  Laterality: Right;   BREAST RECONSTRUCTION  06/29/2011   Procedure: BREAST RECONSTRUCTION;  Surgeon: Wayland Denis, DO;  Location: [facility] OR;  Service: Plastics;  Laterality: Bilateral;  Immediate Bilateral Breast Reconstruction with Bilateral Tissue Expanders and placement of  Alloderm    DILATION AND CURETTAGE OF UTERUS     fibroid tumor surgery     lazy eye     corrective surgery   MASTECTOMY W/ SENTINEL NODE BIOPSY  06/29/2011   Procedure: MASTECTOMY WITH SENTINEL LYMPH NODE BIOPSY;  Surgeon: [name], MD;  Location: [facility] OR;  Service: General;  Laterality: Right;  right skin spairing mstectomy and right sentinel lymph node biopsy   PORT-A-CATH REMOVAL  03/12/2012   Procedure: MINOR REMOVAL PORT-A-CATH;  Surgeon: [name], MD;  Location: [facility];  Service: General;  Laterality: N/A;  Upper left   PORTACATH PLACEMENT Left 05/05/2013   Procedure: INSERTION PORT-A-CATH;  Surgeon: [name], MD;  Location: [facility] OR;  Service: General;  Laterality: Left;   RHINOPLASTY     uterine cervical treatments  2002   for precancerous lesions   Patient Active Problem List   Diagnosis Date Noted   Constipation 05/25/2022   Other fatigue 05/25/2022   Dizziness 05/25/2022   Endometrial thickening on ultrasound 05/24/2022   Abnormal laboratory test  result 04/25/2022   Snoring 07/25/2021   Excessive daytime sleepiness 07/25/2021   Routine general medical examination at a health care facility 07/08/2021   Positive ANA (antinuclear antibody) 03/18/2021   Left leg pain 03/18/2021   Arthralgia 03/11/2021   Back pain 02/26/2021   Neck pain 02/26/2021   Gastroesophageal reflux disease 11/20/2019   Genetic testing 02/01/2018   Major depressive disorder, in remission 03/12/2017   Liver masses 12/06/2016   Recurrent cancer of right breast 12/22/2013   Headache 06/12/2013    Thrombocytopenia 05/22/2013   Malignant neoplasm of lower-inner quadrant of right breast of female, estrogen receptor positive 2012    PCP: Myrlene Broker, MD  REFERRING PROVIDER: Napoleon Form, MD   REFERRING DIAG: M62.89 (ICD-10-CM) - Pelvic floor dysfunction  THERAPY DIAG:  Muscle weakness (generalized)  Other muscle spasm  Unspecified lack of coordination  Constipation, unspecified constipation type  Other low back pain  Rationale for Evaluation and Treatment: Rehabilitation  ONSET DATE: 1 year  SUBJECTIVE:                                                                                                                                                                                           SUBJECTIVE STATEMENT: Pt is working on increasing fiber. She has been trying dried fruit. She feels like she is getting more movement in bowels. She feels like she is having slightly more spasm in low back after last session. She still feels like she is at a 4/10, but it has been up to 5-6/10.   Fluid intake: Yes: drinks a lot of water    PAIN:  Are you having pain? Yes NPRS scale: 4/10 Pain location:  low back pain  Pain type: aching Pain description: intermittent and constant   Aggravating factors: prolonged sitting, constipation Relieving factors: movement  PRECAUTIONS: None  WEIGHT BEARING RESTRICTIONS: No  FALLS:  Has patient fallen in last 6 months? 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, bil mastectomy, osteopenia, fibroid removal in 2004   Sexual abuse: No  BOWEL MOVEMENT: Pain with bowel movement: No, but have been in the past, sometimes will throw up during bowel movement Type of bowel movement:Frequency every  1-2 weeks, Strain No, and Splinting no Fully empty rectum: No Leakage: No Pads: No Fiber supplement: No, but tries to eat fiber rich foods; she does over the counter stool softeners, sometimes Miralax Feels like she  does not get appropriate urgency to have a bowel movement  URINATION: Pain with urination: No Fully empty bladder: Yes: - Stream:  start and stop Urgency: Yes: but no leaking Frequency: will vary on the day, bad days every 1-2 hours, on a good day every 2-3 hours Leakage:  none Pads: No  INTERCOURSE: Pain with intercourse: Initial Penetration and During Penetration Ability to have vaginal penetration:  Yes: yes, pain and bleeding at posterior fourchette  Climax: sometimes    PREGNANCY: Vaginal deliveries 1 Tearing Yes: episiotomy   OBJECTIVE:  09/21/22: PALPATION:   General  Abdominal tenderness                External Perineal Exam WNL                             Internal Pelvic Floor WNL  Patient confirms identification and approves PT to assess internal pelvic floor and treatment Yes   PELVIC MMT:   MMT eval  Vaginal 3/5, 11 sec hold, 6 repeat  Internal Anal Sphincter 2/5  External Anal Sphincter 2/5  Puborectalis 2/5  Diastasis Recti WNL  (Blank rows = not tested)        TONE: WNL  PROLAPSE: Gr 1 anterior vaginal wall laxity, gr 1 posterior vaginal wall laxity 08/03/22: DIAGNOSTIC FINDINGS:  MRI, [place], CT scans all of abdomen and pelvis with demonstrating constipation  COGNITION: Overall cognitive status: Within functional limits for tasks assessed     SENSATION: Light touch: Appears intact Proprioception: Appears intact  GAIT: Comments: WNL  POSTURE:  Rt facing sacral location   LUMBARAROM/PROM:  A/PROM A/PROM  Eval (% available)  Flexion 100  Extension 100  Right lateral flexion 100  Left lateral flexion 100  Right rotation 100  Left rotation 100   (Blank rows = not tested)      TODAY'S TREATMENT:                                                                                                                               DATE:  10/19/22 Manual: Trigger Point Dry-Needling  Treatment instructions: Expect mild to moderate muscle soreness. S/S of pneumothorax if dry needled over a lung field, and to seek immediate medical attention should they occur. Patient verbalized understanding of these instructions and education.  Patient Consent Given: Yes Education handout provided: Yes Muscles treated: T12-L5 multifidi, bil glutes Electrical stimulation performed: No Parameters: N/A Treatment response/outcome: twitch response/release Soft tissue mobilization to lumbar paraspinals Myofascial release to thoracolumbar fascia Rt L5 PA mobilization Gr 3   10/05/22 Manual: Trigger Point Dry-Needling  Treatment instructions: Expect mild to moderate muscle soreness. S/S of pneumothorax if dry needled over a lung field, and to seek immediate medical attention should they occur. Patient verbalized understanding of these instructions and education.  Patient Consent Given: Yes Education handout provided: Yes Muscles treated: T12-L5 multifidi, bil glutes Electrical stimulation  performed: No Parameters: N/A Treatment response/outcome: twitch response/release Soft tissue mobilization to lumbar paraspinals Myofascial release to thoracolumbar fascia   09/26/22 Neuromuscular re-education: Transversus abdominus training with multimodal cues for improved motor control and breath coordination Bil supine UE ball press 12x Side lying UE ball press 10x bil Hip adduction ball press 12x Exercises: Lower trunk rotation 2 x 10 Cat cow 2 x 10 Child's pose 10 breaths Happy baby 10 breaths    PATIENT EDUCATION:  Education details: See above Person educated: Patient Education method: Explanation, Demonstration, Tactile cues, Verbal cues, and Handouts Education comprehension: verbalized understanding  HOME EXERCISE  PROGRAM: [id number]  ASSESSMENT:  CLINICAL IMPRESSION: Dry needling performed again this session due to increase in bowel movements over the last week and likely benefit. She demonstrated L5 Rt facing rotation that PA mobilization performed on to help reduce. She tolerated all manual techniques well. Limited other treatment due to late arrival. She will continue to benefit from skilled PT intervention in order to improve frequency and ease of bowel movements, decrease low back pain, implement functional strengthening program for core and pelvic floor with appropriate pressure management, and decrease dyspareunia.   OBJECTIVE IMPAIRMENTS: decreased activity tolerance, decreased coordination, decreased endurance, decreased strength, increased fascial restrictions, increased muscle spasms, impaired tone, postural dysfunction, and pain.   ACTIVITY LIMITATIONS:  bowel movements   PARTICIPATION LIMITATIONS:  functional activities  PERSONAL FACTORS: 1 comorbidity: medical history  are also affecting patient's functional outcome.   REHAB POTENTIAL: Good  CLINICAL DECISION MAKING: Stable/uncomplicated  EVALUATION COMPLEXITY: Low   GOALS: Goals reviewed with patient? Yes  SHORT TERM GOALS: Target date: 09/14/22 - updated 09/21/22  Pt will be independent with HEP.   Baseline: Goal status: IN PROGRESS  2.  Pt will be independent with use of squatty potty, relaxed toileting mechanics, and improved bowel movement techniques in order to increase ease of bowel movements and complete evacuation.   Baseline:  Goal status: IN PROGRESS  3.  Pt will be independent with diaphragmatic breathing and down training activities in order to improve pelvic floor relaxation.  Baseline:  Goal status: IN PROGRESS    LONG TERM GOALS: Target date: 01/18/2023 - updated 09/21/22  Pt will be independent with advanced HEP.   Baseline:  Goal status:IN PROGRESS  2.  Pt will increase frequency of bowel movements  to 3-4x/week in order to improve comfort.  Baseline:  Goal status: IN PROGRESS  3.  Pt will demonstrate normal pelvic floor muscle tone and A/ROM, able to achieve 4/5 strength with contractions and 10 sec endurance, in order to provide appropriate lumbopelvic support in functional activities.   Baseline:  Goal status:IN PROGRESS  4.  Pt will report low back pain no higher than 2/10. Baseline:  Goal status: IN PROGRESS   PLAN:  PT FREQUENCY: 1-2x/week  PT DURATION: 6 months  PLANNED INTERVENTIONS: Therapeutic exercises, Therapeutic activity, Neuromuscular re-education, Balance training, Gait training, Patient/Family education, Self Care, Joint mobilization, Dry Needling, Biofeedback, and Manual therapy  PLAN FOR NEXT SESSION: Continue to address thoracolumbar restriction; myofascial stretch release     Julio Alm, PT, DPT07/02/2411:45 PM

## 2022-11-02 ENCOUNTER — Ambulatory Visit: Payer: Commercial Managed Care - HMO

## 2022-11-09 ENCOUNTER — Ambulatory Visit: Payer: Commercial Managed Care - HMO | Attending: Gastroenterology

## 2022-11-09 DIAGNOSIS — M5459 Other low back pain: Secondary | ICD-10-CM | POA: Insufficient documentation

## 2022-11-09 DIAGNOSIS — R279 Unspecified lack of coordination: Secondary | ICD-10-CM | POA: Insufficient documentation

## 2022-11-09 DIAGNOSIS — M6281 Muscle weakness (generalized): Secondary | ICD-10-CM | POA: Insufficient documentation

## 2022-11-09 DIAGNOSIS — M62838 Other muscle spasm: Secondary | ICD-10-CM | POA: Diagnosis present

## 2022-11-09 DIAGNOSIS — K5900 Constipation, unspecified: Secondary | ICD-10-CM | POA: Insufficient documentation

## 2022-11-09 NOTE — Therapy (Signed)
OUTPATIENT PHYSICAL THERAPY FEMALE PELVIC TREATMENT   Patient Name: [name] MRN: [id number] DOB:[birth year], 52 y.o., female Today's Date: 11/09/2022  END OF SESSION:  PT End of Session - 11/09/22 1240     Visit Number 6    Date for PT Re-Evaluation 01/18/23    Authorization Type [employer]    PT Start Time 1239    PT Stop Time 1310    PT Time Calculation (min) 31 min    Activity Tolerance Patient tolerated treatment well    Behavior During Therapy [facility] for tasks assessed/performed                 Past Medical History:  Diagnosis Date   Abdominal pain, epigastric 11/20/2019   Asthma    as a child   Cervical dysplasia    Gastroesophageal reflux disease 11/20/2019   Genetic testing 02/01/2018   Genetic testing was performed in 2015. The [employer] OvaNext panel was ordered. In total, 23 genes were analyzed as part of this panel: ATM, BARD1, BRCA1, BRCA2, BRIP1, CDH1, CHEK2, EPCAM, MLH1, MRE11A, MSH2, MSH6, MUTYH, NBN, NF1, PALB2, PMS2, PTEN, RAD50, RAD51C, RAD51D, STK11, and TP53. No pathogenic variants were detected. Genetic testing did detect a Variant of Unknown Significance (VUS) at the t   GERD (gastroesophageal reflux disease)    had during chemo   Headache 06/12/2013   History of chemotherapy    History of kidney stones    History of radiation therapy 11/24/13- 01/08/14   reconstructed right breast/chest wall 4500 cGy 25 sessions, electron beam boost 1440 cGy 8 sessions   Liver masses 12/06/2016   Major depressive disorder, in remission 03/12/2017   Malignant neoplasm of lower-inner quadrant of right breast of female, estrogen receptor positive 2012   Pneumonia    PONV (postoperative nausea and vomiting)    Recurrent cancer of right breast 12/22/2013   Thrombocytopenia 05/22/2013   Past Surgical History:  Procedure Laterality Date   ADENOIDECTOMY     BREAST LUMPECTOMY WITH NEEDLE LOCALIZATION Right 05/05/2013   Procedure: EXCISION RECURRENT CANCER RIGHT BREAST WITH  NEEDLE LOCALOZATION;  Surgeon: [name], MD;  Location: [facility] OR;  Service: General;  Laterality: Right;   BREAST RECONSTRUCTION  06/29/2011   Procedure: BREAST RECONSTRUCTION;  Surgeon: Wayland Denis, DO;  Location: [facility] OR;  Service: Plastics;  Laterality: Bilateral;  Immediate Bilateral Breast Reconstruction with Bilateral Tissue Expanders and placement of  Alloderm    DILATION AND CURETTAGE OF UTERUS     fibroid tumor surgery     lazy eye     corrective surgery   MASTECTOMY W/ SENTINEL NODE BIOPSY  06/29/2011   Procedure: MASTECTOMY WITH SENTINEL LYMPH NODE BIOPSY;  Surgeon: [name], MD;  Location: [facility] OR;  Service: General;  Laterality: Right;  right skin spairing mstectomy and right sentinel lymph node biopsy   PORT-A-CATH REMOVAL  03/12/2012   Procedure: MINOR REMOVAL PORT-A-CATH;  Surgeon: [name], MD;  Location: [facility];  Service: General;  Laterality: N/A;  Upper left   PORTACATH PLACEMENT Left 05/05/2013   Procedure: INSERTION PORT-A-CATH;  Surgeon: [name], MD;  Location: [facility] OR;  Service: General;  Laterality: Left;   RHINOPLASTY     uterine cervical treatments  2002   for precancerous lesions   Patient Active Problem List   Diagnosis Date Noted   Constipation 05/25/2022   Other fatigue 05/25/2022   Dizziness 05/25/2022   Endometrial thickening on ultrasound 05/24/2022   Abnormal laboratory test  result 04/25/2022   Snoring 07/25/2021   Excessive daytime sleepiness 07/25/2021   Routine general medical examination at a health care facility 07/08/2021   Positive ANA (antinuclear antibody) 03/18/2021   Left leg pain 03/18/2021   Arthralgia 03/11/2021   Back pain 02/26/2021   Neck pain 02/26/2021   Gastroesophageal reflux disease 11/20/2019   Genetic testing 02/01/2018   Major depressive disorder, in remission 03/12/2017   Liver masses 12/06/2016   Recurrent cancer of right breast 12/22/2013   Headache 06/12/2013    Thrombocytopenia 05/22/2013   Malignant neoplasm of lower-inner quadrant of right breast of female, estrogen receptor positive 2012    PCP: Myrlene Broker, MD  REFERRING PROVIDER: Napoleon Form, MD   REFERRING DIAG: M62.89 (ICD-10-CM) - Pelvic floor dysfunction  THERAPY DIAG:  Muscle weakness (generalized)  Other muscle spasm  Unspecified lack of coordination  Constipation, unspecified constipation type  Other low back pain  Rationale for Evaluation and Treatment: Rehabilitation  ONSET DATE: 1 year  SUBJECTIVE:                                                                                                                                                                                           SUBJECTIVE STATEMENT: Pt inquiring if dry needling can cause depression - she reports being very emotional after last session. She feels like it may have been helpful with dealing with pain, but she was sore for many days afterwards - a week. She has been eating dry fruit to help with bowel movements, but she went on vacation and states this made it more difficult to have bowel movements.   Fluid intake: Yes: drinks a lot of water    PAIN:  Are you having pain? Yes NPRS scale: 4/10 Pain location:  low back pain  Pain type: aching Pain description: intermittent and constant   Aggravating factors: prolonged sitting, constipation Relieving factors: movement  PRECAUTIONS: None  WEIGHT BEARING RESTRICTIONS: No  FALLS:  Has patient fallen in last 6 months? 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, bil mastectomy, osteopenia, fibroid removal in 2004   Sexual abuse: No  BOWEL MOVEMENT: Pain with bowel movement: No, but have been in  the past, sometimes will throw up during bowel movement Type of bowel movement:Frequency every 1-2 weeks, Strain No, and Splinting no Fully empty rectum: No Leakage: No Pads: No Fiber supplement: No, but tries to eat fiber  rich foods; she does over the counter stool softeners, sometimes Miralax Feels like she does not get appropriate urgency to have a bowel movement  URINATION: Pain with urination: No Fully empty bladder: Yes: - Stream:  start and stop Urgency: Yes: but no leaking Frequency: will vary on the day, bad days every 1-2 hours, on a good day every 2-3 hours Leakage:  none Pads: No  INTERCOURSE: Pain with intercourse: Initial Penetration and During Penetration Ability to have vaginal penetration:  Yes: yes, pain and bleeding at posterior fourchette  Climax: sometimes    PREGNANCY: Vaginal deliveries 1 Tearing Yes: episiotomy   OBJECTIVE:  09/21/22: PALPATION:   General  Abdominal tenderness                External Perineal Exam WNL                             Internal Pelvic Floor WNL  Patient confirms identification and approves PT to assess internal pelvic floor and treatment Yes   PELVIC MMT:   MMT eval  Vaginal 3/5, 11 sec hold, 6 repeat  Internal Anal Sphincter 2/5  External Anal Sphincter 2/5  Puborectalis 2/5  Diastasis Recti WNL  (Blank rows = not tested)        TONE: WNL  PROLAPSE: Gr 1 anterior vaginal wall laxity, gr 1 posterior vaginal wall laxity 08/03/22: DIAGNOSTIC FINDINGS:  MRI, [place], CT scans all of abdomen and pelvis with demonstrating constipation  COGNITION: Overall cognitive status: Within functional limits for tasks assessed     SENSATION: Light touch: Appears intact Proprioception: Appears intact  GAIT: Comments: WNL  POSTURE:  Rt facing sacral location   LUMBARAROM/PROM:  A/PROM A/PROM  Eval (% available)  Flexion 100  Extension 100  Right lateral flexion 100  Left lateral flexion 100  Right rotation 100   Left rotation 100   (Blank rows = not tested)      TODAY'S TREATMENT:                                                                                                                              DATE:  11/09/22 Manual: Manual trigger point release to lumbar paraspinals/glutes Neuromuscular re-education: Bird-dog 6x bil Seated hip adduction 2 x 10 Seated hip abduction 2 x 10 Seated march yellow band 2 x 10 Exercises: Cat cow 2 x 10 Child's pose 10 breaths with lateral reach each side   10/19/22 Manual: Trigger Point Dry-Needling  Treatment instructions: Expect mild to moderate muscle soreness. S/S of pneumothorax if dry needled over a lung field, and to seek immediate medical attention should they occur. Patient verbalized understanding of these instructions and education.  Patient Consent Given: Yes Education handout provided: Yes Muscles treated: T12-L5 multifidi, bil glutes Electrical stimulation performed: No Parameters: N/A Treatment response/outcome: twitch response/release Soft tissue mobilization to lumbar paraspinals Myofascial release to thoracolumbar fascia Rt L5  PA mobilization Gr 3   10/05/22 Manual: Trigger Point Dry-Needling  Treatment instructions: Expect mild to moderate muscle soreness. S/S of pneumothorax if dry needled over a lung field, and to seek immediate medical attention should they occur. Patient verbalized understanding of these instructions and education.  Patient Consent Given: Yes Education handout provided: Yes Muscles treated: T12-L5 multifidi, bil glutes Electrical stimulation performed: No Parameters: N/A Treatment response/outcome: twitch response/release Soft tissue mobilization to lumbar paraspinals Myofascial release to thoracolumbar fascia    PATIENT EDUCATION:  Education details: See above Person educated: Patient Education method: Explanation, Demonstration, Tactile cues, Verbal cues, and Handouts Education  comprehension: verbalized understanding  HOME EXERCISE PROGRAM: [id number]  ASSESSMENT:  CLINICAL IMPRESSION: Discussed that dry needling most likely not responsible for heightened emotional state after last visit. She tolerated manual techniques to low back well with reduced pain levels. She was able to progress core strengthening without increase in pain. HEP updated. She will continue to benefit from skilled PT intervention in order to improve frequency and ease of bowel movements, decrease low back pain, implement functional strengthening program for core and pelvic floor with appropriate pressure management, and decrease dyspareunia.   OBJECTIVE IMPAIRMENTS: decreased activity tolerance, decreased coordination, decreased endurance, decreased strength, increased fascial restrictions, increased muscle spasms, impaired tone, postural dysfunction, and pain.   ACTIVITY LIMITATIONS:  bowel movements   PARTICIPATION LIMITATIONS:  functional activities  PERSONAL FACTORS: 1 comorbidity: medical history  are also affecting patient's functional outcome.   REHAB POTENTIAL: Good  CLINICAL DECISION MAKING: Stable/uncomplicated  EVALUATION COMPLEXITY: Low   GOALS: Goals reviewed with patient? Yes  SHORT TERM GOALS: Target date: 09/14/22 - updated 09/21/22  Pt will be independent with HEP.   Baseline: Goal status: IN PROGRESS  2.  Pt will be independent with use of squatty potty, relaxed toileting mechanics, and improved bowel movement techniques in order to increase ease of bowel movements and complete evacuation.   Baseline:  Goal status: IN PROGRESS  3.  Pt will be independent with diaphragmatic breathing and down training activities in order to improve pelvic floor relaxation.  Baseline:  Goal status: IN PROGRESS    LONG TERM GOALS: Target date: 01/18/2023 - updated 09/21/22  Pt will be independent with advanced HEP.   Baseline:  Goal status:IN PROGRESS  2.  Pt will increase  frequency of bowel movements to 3-4x/week in order to improve comfort.  Baseline:  Goal status: IN PROGRESS  3.  Pt will demonstrate normal pelvic floor muscle tone and A/ROM, able to achieve 4/5 strength with contractions and 10 sec endurance, in order to provide appropriate lumbopelvic support in functional activities.   Baseline:  Goal status:IN PROGRESS  4.  Pt will report low back pain no higher than 2/10. Baseline:  Goal status: IN PROGRESS   PLAN:  PT FREQUENCY: 1-2x/week  PT DURATION: 6 months  PLANNED INTERVENTIONS: Therapeutic exercises, Therapeutic activity, Neuromuscular re-education, Balance training, Gait training, Patient/Family education, Self Care, Joint mobilization, Dry Needling, Biofeedback, and Manual therapy  PLAN FOR NEXT SESSION: Continue to address thoracolumbar restriction; myofascial stretch release; progress core strengthening     Julio Alm, PT, DPT08/01/241:41 PM

## 2022-11-16 ENCOUNTER — Ambulatory Visit: Payer: Commercial Managed Care - HMO

## 2022-11-21 ENCOUNTER — Ambulatory Visit: Payer: Commercial Managed Care - HMO | Admitting: Allergy & Immunology

## 2022-11-27 ENCOUNTER — Other Ambulatory Visit: Payer: Self-pay

## 2022-11-27 ENCOUNTER — Encounter: Payer: Self-pay | Admitting: Gastroenterology

## 2022-11-29 ENCOUNTER — Other Ambulatory Visit: Payer: Self-pay

## 2022-11-29 MED ORDER — LINACLOTIDE 145 MCG PO CAPS: 145.0000 ug | ORAL_CAPSULE | Freq: Every day | ORAL | 6 refills | Status: DC

## 2022-11-29 NOTE — Telephone Encounter (Signed)
Yes it is fine to send prescription for Linzess 145 mcg daily, 30 days with 6 refills.

## 2022-12-12 ENCOUNTER — Ambulatory Visit: Payer: Commercial Managed Care - HMO | Admitting: Neurology

## 2022-12-25 ENCOUNTER — Ambulatory Visit: Payer: Commercial Managed Care - HMO | Admitting: Neurology

## 2022-12-26 ENCOUNTER — Ambulatory Visit (HOSPITAL_BASED_OUTPATIENT_CLINIC_OR_DEPARTMENT_OTHER)
Admission: RE | Admit: 2022-12-26 | Discharge: 2022-12-26 | Disposition: A | Discharge status: Home / Self Care | Payer: Commercial Managed Care - HMO | Source: Ambulatory Visit | Attending: Obstetrics | Admitting: Obstetrics

## 2022-12-26 DIAGNOSIS — E2839 Other primary ovarian failure: Secondary | ICD-10-CM | POA: Diagnosis present

## 2022-12-26 DIAGNOSIS — Z780 Asymptomatic menopausal state: Secondary | ICD-10-CM | POA: Diagnosis present

## 2022-12-28 ENCOUNTER — Telehealth: Payer: Self-pay

## 2022-12-28 NOTE — Telephone Encounter (Signed)
Patient states that she will be transferring her care to Dr. Hyacinth Meeker to discus possible hysterectomy and routine care.

## 2022-12-28 NOTE — Progress Notes (Deleted)
NEW PATIENT Date of Service/Encounter:  12/28/22 Referring provider: Napoleon Form, MD Primary care provider: Myrlene Broker, MD  Subjective:  [name] is a 52 y.o. female with a PMHx of postmenopausal, Malignant neoplasm of lower-inner quadrant of right breast of female, estrogen receptor positive status post bilateral mastectomy and chemotherapy in remission presenting today for evaluation of *** History obtained from: chart review and {Persons; PED relatives w/patient:[id number]::"patient"}.   ***  Chart Review:  Reviewed PCP notes from referral 06/19/22: IBS constipation and dyssynergic defecation, GERD and dyspesia: "Celiac and alpha gal allergy negative.  Patient is worried about potential food and environmental allergies. Will refer to allergy specialist for testing followed by referral to nutritionist if needed"  Other allergy screening: Asthma: {Blank single:19197::"yes","no"} Rhino conjunctivitis: {Blank single:19197::"yes","no"} Food allergy: {Blank single:19197::"yes","no"} Medication allergy: {Blank single:19197::"yes","no"} Hymenoptera allergy: {Blank single:19197::"yes","no"} Urticaria: {Blank single:19197::"yes","no"} Eczema:{Blank single:19197::"yes","no"} History of recurrent infections suggestive of immunodeficency: {Blank single:19197::"yes","no"} ***Vaccinations are up to date.   Past Medical History: Past Medical History:  Diagnosis Date   Abdominal pain, epigastric 11/20/2019   Asthma    as a child   Cervical dysplasia    Gastroesophageal reflux disease 11/20/2019   Genetic testing 02/01/2018   Genetic testing was performed in 2015. The [employer] O[employer] panel was ordered. In total, 23 genes were analyzed as part of this panel: ATM, BARD1, BRCA1, BRCA2, BRIP1, CDH1, CHEK2, EPCAM, MLH1, MRE11A, MSH2, MSH6, MUTYH, NBN, NF1, PALB2, PMS2, PTEN, RAD50, RAD51C, RAD51D, STK11, and TP53. No pathogenic variants were detected. Genetic testing did detect a  Variant of Unknown Significance (VUS) at the t   GERD (gastroesophageal reflux disease)    had during chemo   Headache 06/12/2013   History of chemotherapy    History of kidney stones    History of radiation therapy 11/24/13- 01/08/14   reconstructed right breast/chest wall 4500 cGy 25 sessions, electron beam boost 1440 cGy 8 sessions   Liver masses 12/06/2016   Major depressive disorder, in remission 03/12/2017   Malignant neoplasm of lower-inner quadrant of right breast of female, estrogen receptor positive 2012   Pneumonia    PONV (postoperative nausea and vomiting)    Recurrent cancer of right breast 12/22/2013   Thrombocytopenia 05/22/2013   Medication List:  Current Outpatient Medications  Medication Sig Dispense Refill   Acetaminophen-Caffeine (EXCEDRIN TENSION HEADACHE PO) Take by mouth.     Biotin 5000 MCG CAPS Take 5,000 mcg by mouth daily.     Digestive Enzyme CAPS Take by mouth.     ibuprofen (ADVIL) 800 MG tablet TAKE 1 TABLET BY MOUTH EVERY 8 HOURS AS NEEDED 30 tablet 5   linaclotide (LINZESS) 145 MCG CAPS capsule Take 1 capsule (145 mcg total) by mouth daily before breakfast. 30 capsule 6   Prenat-Fe Poly-Methfol-FA-DHA (VITAFOL FE+) 90-0.6-0.4-200 MG CAPS Take 1 tablet by mouth daily. 30 capsule 11   No current facility-administered medications for this visit.   Known Allergies:  Allergies  Allergen Reactions   Tussionex Pennkinetic Er [Hydrocod Poli-Chlorphe Poli Er] Other (See Comments)    hallucinations   Morphine Rash   Morphine And Codeine Anxiety    anxiety   Penicillin G Rash   Penicillins Rash   Past Surgical History: Past Surgical History:  Procedure Laterality Date   ADENOIDECTOMY     BREAST LUMPECTOMY WITH NEEDLE LOCALIZATION Right 05/05/2013   Procedure: EXCISION RECURRENT CANCER RIGHT BREAST WITH NEEDLE LOCALOZATION;  Surgeon: [name], MD;  Location: [facility] OR;  Service: General;  Laterality: Right;   BREAST RECONSTRUCTION  06/29/2011   Procedure:  BREAST RECONSTRUCTION;  Surgeon: Wayland Denis, DO;  Location: [facility] OR;  Service: Plastics;  Laterality: Bilateral;  Immediate Bilateral Breast Reconstruction with Bilateral Tissue Expanders and placement of  Alloderm    DILATION AND CURETTAGE OF UTERUS     fibroid tumor surgery     lazy eye     corrective surgery   MASTECTOMY W/ SENTINEL NODE BIOPSY  06/29/2011   Procedure: MASTECTOMY WITH SENTINEL LYMPH NODE BIOPSY;  Surgeon: [name], MD;  Location: [facility];  Service: General;  Laterality: Right;  right skin spairing mstectomy and right sentinel lymph node biopsy   PORT-A-CATH REMOVAL  03/12/2012   Procedure: MINOR REMOVAL PORT-A-CATH;  Surgeon: [name], MD;  Location: [facility];  Service: General;  Laterality: N/A;  Upper left   PORTACATH PLACEMENT Left 05/05/2013   Procedure: INSERTION PORT-A-CATH;  Surgeon: [name], MD;  Location: [facility];  Service: General;  Laterality: Left;   RHINOPLASTY     uterine cervical treatments  2002   for precancerous lesions   Family History: Family History  Adopted: Yes  Problem Relation Age of Onset   Colon cancer Mother 45   Rectal cancer Mother    Cervical cancer Mother    Heart attack Father    Cancer Maternal Aunt 60       stomach and ovarian as separate primaries   Cancer Maternal Aunt 55       breast   Stomach cancer Maternal Aunt    Cancer Maternal Uncle 65       lung; smoker   Cancer Maternal Uncle 63       prostate   Cancer Maternal Grandmother 75       enodometrial   Cancer Cousin 40       melanoma; mat first cousin, located on neck   Social History: [place] lives ***.   ROS:  All other systems negative except as noted per HPI.  Objective:  Last menstrual period 03/11/2011. There is no height or weight on file to calculate BMI. Physical Exam:  General Appearance:  Alert, cooperative, no distress, appears stated age  Head:  Normocephalic, without obvious abnormality, atraumatic  Eyes:   Conjunctiva clear, EOM's intact  Ears {Blank multiple:19196:a:"***","EACs normal bilaterally","normal TMs bilaterally","ear tubes present bilaterally without exudate"}  Nose: Nares normal, {Blank multiple:19196:a:"***","hypertrophic turbinates","normal mucosa","no visible anterior polyps","septum midline"}  Throat: Lips, tongue normal; teeth and gums normal, {Blank multiple:19196:a:"***","normal posterior oropharynx","tonsils 2+","tonsils 3+","no tonsillar exudate","+ cobblestoning","surgically absent tonsils","mildly erythematous posterior oropharynx"}  Neck: Supple, symmetrical  Lungs:   {Blank multiple:19196:a:"***","clear to auscultation bilaterally","end-expiratory wheezing","wheezing throughout"}, Respirations unlabored, {Blank multiple:19196:a:"***","no coughing","intermittent dry coughing","intermittent productive-sounding cough"}  Heart:  {Blank multiple:19196:a:"***","regular rate and rhythm","no murmur"}, Appears well perfused  Extremities: No edema  Skin: {Blank multiple:19196:a:"***","erythematous, dry patches scattered on ***","lichenification on ***","Skin color, texture, turgor normal","no rashes or lesions on visualized portions of skin"}  Neurologic: No gross deficits   Diagnostics: Spirometry:  Tracings reviewed. Her effort: {Blank single:19197::"Good reproducible efforts.","It was hard to get consistent efforts and there is a question as to whether this reflects a maximal maneuver.","Poor effort, data can not be interpreted.","Variable effort-results affected","effort okay for first attempt at spirometry.","Results not reproducible due to ***"} FVC: ***L (pre), ***L  (post) FEV1: ***L, ***% predicted (pre), ***L, ***% predicted (post) FEV1/FVC ratio: *** (pre), *** (post) Interpretation: {Blank single:19197::"Spirometry consistent with mild obstructive disease","Spirometry consistent with moderate obstructive disease","Spirometry consistent with severe obstructive  disease","Spirometry consistent with possible restrictive disease","Spirometry consistent with mixed obstructive and restrictive disease","Spirometry uninterpretable due to technique","Spirometry consistent with normal pattern","No overt abnormalities noted given today's efforts","Nonobstructive ratio, low FEV1","Nonobstructive ratio, low FEV1, possible restriction"}.  Please see scanned spirometry results for details.  Skin Testing: {Blank single:19197::"Select foods","Environmental allergy panel","Environmental allergy panel and select foods","Food allergy panel","None","Deferred due to recent antihistamines use","deferred due to recent reaction","Pediatric Environmental Allergy Panel","Pediatric Food Panel","Select foods and environmental allergies"}. {Blank single:19197::"Adequate positive and negative controls","Inadequate positive control-testing invalid","Adequate positive and negative controls, dermatographism present, testing difficult to interpret"}. Results discussed with patient/family.   {Blank single:19197::"Allergy testing results were read and interpreted by myself, documented by clinical staff.","Allergy testing results were read by ***,FNP, documented by clinical staff"}  Labs:  Lab Orders  No laboratory test(s) ordered today     Assessment and Plan  ***  {Blank single:19197::"This note in its entirety was forwarded to the Provider who requested this consultation."}  Other: {Blank multiple:19196:a:"***","samples provided of: ***","school forms provided","reviewed spirometry technique","reviewed inhaler technique"}  Thank you for your kind referral. I appreciate the opportunity to take part in Bridney's care. Please do not hesitate to contact me with questions.***  Sincerely,  Tonny Bollman, MD [facility]

## 2023-01-01 ENCOUNTER — Ambulatory Visit: Payer: Commercial Managed Care - HMO | Admitting: Internal Medicine

## 2023-01-10 ENCOUNTER — Ambulatory Visit: Payer: Commercial Managed Care - HMO | Attending: Gastroenterology

## 2023-01-10 DIAGNOSIS — R293 Abnormal posture: Secondary | ICD-10-CM | POA: Insufficient documentation

## 2023-01-10 DIAGNOSIS — R279 Unspecified lack of coordination: Secondary | ICD-10-CM | POA: Insufficient documentation

## 2023-01-10 DIAGNOSIS — M5459 Other low back pain: Secondary | ICD-10-CM | POA: Insufficient documentation

## 2023-01-10 DIAGNOSIS — M6281 Muscle weakness (generalized): Secondary | ICD-10-CM | POA: Insufficient documentation

## 2023-01-10 DIAGNOSIS — M62838 Other muscle spasm: Secondary | ICD-10-CM | POA: Diagnosis present

## 2023-01-10 NOTE — Therapy (Signed)
OUTPATIENT PHYSICAL THERAPY FEMALE PELVIC TREATMENT   Patient Name: [name] MRN: [id number] DOB:25-Aug-1970, 52 y.o., female Today's Date: 01/10/2023  END OF SESSION:  PT End of Session - 01/10/23 1250     Visit Number 7    Date for PT Re-Evaluation 01/18/23    Authorization Type [employer]    PT Start Time 1243    PT Stop Time 1313    PT Time Calculation (min) 30 min    Activity Tolerance Patient tolerated treatment well    Behavior During Therapy [facility] for tasks assessed/performed                  Past Medical History:  Diagnosis Date   Abdominal pain, epigastric 11/20/2019   Asthma    as a child   Cervical dysplasia    Gastroesophageal reflux disease 11/20/2019   Genetic testing 02/01/2018   Genetic testing was performed in 2015. The [employer] panel was ordered. In total, 23 genes were analyzed as part of this panel: ATM, BARD1, BRCA1, BRCA2, BRIP1, CDH1, CHEK2, EPCAM, MLH1, MRE11A, MSH2, MSH6, MUTYH, NBN, NF1, PALB2, PMS2, PTEN, RAD50, RAD51C, RAD51D, STK11, and TP53. No pathogenic variants were detected. Genetic testing did detect a Variant of Unknown Significance (VUS) at the t   GERD (gastroesophageal reflux disease)    had during chemo   Headache 06/12/2013   History of chemotherapy    History of kidney stones    History of radiation therapy 11/24/13- 01/08/14   reconstructed right breast/chest wall 4500 cGy 25 sessions, electron beam boost 1440 cGy 8 sessions   Liver masses 12/06/2016   Major depressive disorder, in remission 03/12/2017   Malignant neoplasm of lower-inner quadrant of right breast of female, estrogen receptor positive 2012   Pneumonia    PONV (postoperative nausea and vomiting)    Recurrent cancer of right breast 12/22/2013   Thrombocytopenia 05/22/2013   Past Surgical History:  Procedure Laterality Date   ADENOIDECTOMY     BREAST LUMPECTOMY WITH NEEDLE LOCALIZATION Right 05/05/2013   Procedure: EXCISION RECURRENT CANCER RIGHT BREAST WITH  NEEDLE LOCALOZATION;  Surgeon: [name], MD;  Location: [facility] OR;  Service: General;  Laterality: Right;   BREAST RECONSTRUCTION  06/29/2011   Procedure: BREAST RECONSTRUCTION;  Surgeon: Wayland Denis, DO;  Location: [facility] OR;  Service: Plastics;  Laterality: Bilateral;  Immediate Bilateral Breast Reconstruction with Bilateral Tissue Expanders and placement of  Alloderm    DILATION AND CURETTAGE OF UTERUS     fibroid tumor surgery     lazy eye     corrective surgery   MASTECTOMY W/ SENTINEL NODE BIOPSY  06/29/2011   Procedure: MASTECTOMY WITH SENTINEL LYMPH NODE BIOPSY;  Surgeon: [name], MD;  Location: [facility] OR;  Service: General;  Laterality: Right;  right skin spairing mstectomy and right sentinel lymph node biopsy   PORT-A-CATH REMOVAL  03/12/2012   Procedure: MINOR REMOVAL PORT-A-CATH;  Surgeon: [name], MD;  Location: [facility];  Service: General;  Laterality: N/A;  Upper left   PORTACATH PLACEMENT Left 05/05/2013   Procedure: INSERTION PORT-A-CATH;  Surgeon: [name], MD;  Location[facility];  Service: General;  Laterality: Left;   RHINOPLASTY     uterine cervical treatments  2002   for precancerous lesions   Patient Active Problem List   Diagnosis Date Noted   Constipation 05/25/2022   Other fatigue 05/25/2022   Dizziness 05/25/2022   Endometrial thickening on ultrasound 05/24/2022   Abnormal laboratory  test result 04/25/2022   Snoring 07/25/2021   Excessive daytime sleepiness 07/25/2021   Routine general medical examination at a health care facility 07/08/2021   Positive ANA (antinuclear antibody) 03/18/2021   Left leg pain 03/18/2021   Arthralgia 03/11/2021   Back pain 02/26/2021   Neck pain 02/26/2021   Gastroesophageal reflux disease 11/20/2019   Genetic testing 02/01/2018   Major depressive disorder, in remission 03/12/2017   Liver masses 12/06/2016   Recurrent cancer of right breast 12/22/2013   Headache 06/12/2013    Thrombocytopenia 05/22/2013   Malignant neoplasm of lower-inner quadrant of right breast of female, estrogen receptor positive 2012    PCP: Myrlene Broker, MD  REFERRING PROVIDER: Napoleon Form, MD   REFERRING DIAG: M62.89 (ICD-10-CM) - Pelvic floor dysfunction  THERAPY DIAG:  Muscle weakness (generalized)  Other muscle spasm  Unspecified lack of coordination  Other low back pain  Abnormal posture  Rationale for Evaluation and Treatment: Rehabilitation  ONSET DATE: 1 year  SUBJECTIVE:                                                                                                                                                                                           SUBJECTIVE STATEMENT: Pt states that she has been diagnosed with osteoporosis and is upset about it. She is wanting to do more weight bearing exercises due to this. She has been working on drinking more prune juice to help improve number of bowel movements that she is having. She is now having diarrhea, but less bloating. She only has bowel movement when she is drinking prune juice, and that is typically 2-3x/week.   Fluid intake: Yes: drinks a lot of water    PAIN:  Are you having pain? Yes NPRS scale: 4/10 Pain location:  low back pain  Pain type: aching Pain description: intermittent and constant   Aggravating factors: prolonged sitting, constipation Relieving factors: movement  PRECAUTIONS: None  WEIGHT BEARING RESTRICTIONS: No  FALLS:  Has patient fallen in last 6 months? 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, bil mastectomy, osteopenia, fibroid removal in 2004   Sexual abuse: No  BOWEL MOVEMENT: Pain with bowel movement: No, but have been in  the past, sometimes will throw up during bowel movement Type of bowel movement:Frequency every 1-2 weeks, Strain No, and Splinting no Fully empty rectum: No Leakage: No Pads: No Fiber supplement: No, but tries to  eat fiber rich foods; she does over the counter stool softeners, sometimes Miralax Feels like she does not get appropriate urgency to have a bowel movement  URINATION: Pain with urination: No Fully empty bladder: Yes: - Stream:  start and stop Urgency: Yes: but no leaking Frequency: will vary on the day, bad days every 1-2 hours, on a good day every 2-3 hours Leakage:  none Pads: No  INTERCOURSE: Pain with intercourse: Initial Penetration and During Penetration Ability to have vaginal penetration:  Yes: yes, pain and bleeding at posterior fourchette  Climax: sometimes    PREGNANCY: Vaginal deliveries 1 Tearing Yes: episiotomy   OBJECTIVE:  09/21/22: PALPATION:   General  Abdominal tenderness                External Perineal Exam WNL                             Internal Pelvic Floor WNL  Patient confirms identification and approves PT to assess internal pelvic floor and treatment Yes   PELVIC MMT:   MMT eval  Vaginal 3/5, 11 sec hold, 6 repeat  Internal Anal Sphincter 2/5  External Anal Sphincter 2/5  Puborectalis 2/5  Diastasis Recti WNL  (Blank rows = not tested)        TONE: WNL  PROLAPSE: Gr 1 anterior vaginal wall laxity, gr 1 posterior vaginal wall laxity 08/03/22: DIAGNOSTIC FINDINGS:  MRI, [place], CT scans all of abdomen and pelvis with demonstrating constipation  COGNITION: Overall cognitive status: Within functional limits for tasks assessed     SENSATION: Light touch: Appears intact Proprioception: Appears intact  GAIT: Comments: WNL  POSTURE:  Rt facing sacral location   LUMBARAROM/PROM:  A/PROM A/PROM  Eval (% available)  Flexion 100  Extension 100  Right lateral flexion 100  Left lateral flexion 100  Right rotation 100   Left rotation 100   (Blank rows = not tested)      TODAY'S TREATMENT:                                                                                                                              DATE:  01/10/23 Therapeutic activities: Fiber intake - focusing on whole foods  Encouraged to keep drinking prune juice if it is helping - maybe try eating prunes instead of drinking prune juice Using squatty potty even if she is having diarrhea Working on making sure she is relaxed and emptying completely when she has diarrhea Getting out of diarrhea/constipation cycle Strength/resistance training being good for constipation   11/09/22 Manual: Manual trigger point release to lumbar paraspinals/glutes Neuromuscular re-education: Bird-dog 6x bil Seated hip adduction 2 x 10 Seated hip abduction 2 x 10 Seated march yellow band 2 x 10 Exercises: Cat cow 2 x 10 Child's pose 10 breaths with lateral reach each side   10/19/22 Manual: Trigger Point Dry-Needling  Treatment instructions: Expect mild  to moderate muscle soreness. S/S of pneumothorax if dry needled over a lung field, and to seek immediate medical attention should they occur. Patient verbalized understanding of these instructions and education.  Patient Consent Given: Yes Education handout provided: Yes Muscles treated: T12-L5 multifidi, bil glutes Electrical stimulation performed: No Parameters: N/A Treatment response/outcome: twitch response/release Soft tissue mobilization to lumbar paraspinals Myofascial release to thoracolumbar fascia Rt L5 PA mobilization Gr 3    PATIENT EDUCATION:  Education details: See above Person educated: Patient Education method: Programmer, multimedia, Demonstration, Tactile cues, Verbal cues, and Handouts Education comprehension: verbalized understanding  HOME EXERCISE PROGRAM: [id number]  ASSESSMENT:  CLINICAL IMPRESSION: Pt arrived very late for her appointment again and wanted to discuss  osteoporosis and diet impact on bowel movements. She was encouraged that we need to be on time for appointments and consistent in order to see progress and continue this type of therapy. We discussed that prune juice may be causing loose stools and she could try to work on eating whole prunes instead to see if this helps the consistency of her bowel movements. She does report improved low back pain when she is able to have more consistent bowel movements. Believe that diarrhea may be irritating pelvic floor causing her tailbone pain; she was encouraged to continue using squatty potty and working on relaxation. She will continue to benefit from skilled PT intervention in order to improve frequency and ease of bowel movements, decrease low back pain, implement functional strengthening program for core and pelvic floor with appropriate pressure management, and decrease dyspareunia.   OBJECTIVE IMPAIRMENTS: decreased activity tolerance, decreased coordination, decreased endurance, decreased strength, increased fascial restrictions, increased muscle spasms, impaired tone, postural dysfunction, and pain.   ACTIVITY LIMITATIONS:  bowel movements   PARTICIPATION LIMITATIONS:  functional activities  PERSONAL FACTORS: 1 comorbidity: medical history  are also affecting patient's functional outcome.   REHAB POTENTIAL: Good  CLINICAL DECISION MAKING: Stable/uncomplicated  EVALUATION COMPLEXITY: Low   GOALS: Goals reviewed with patient? Yes  SHORT TERM GOALS: Target date: 09/14/22 - updated 09/21/22 - 01/10/2023  Pt will be independent with HEP.   Baseline: Goal status: MET 01/10/23  2.  Pt will be independent with use of squatty potty, relaxed toileting mechanics, and improved bowel movement techniques in order to increase ease of bowel movements and complete evacuation.   Baseline:  Goal status:Met 01/10/23  3.  Pt will be independent with diaphragmatic breathing and down training activities in order  to improve pelvic floor relaxation.  Baseline:  Goal status: MET 01/10/23    LONG TERM GOALS: Target date: 01/18/2023 - updated 09/21/22 - updated 01/10/2023  Pt will be independent with advanced HEP.   Baseline:  Goal status:IN PROGRESS  2.  Pt will increase frequency of bowel movements to 3-4x/week in order to improve comfort.  Baseline:  Goal status: IN PROGRESS  3.  Pt will demonstrate normal pelvic floor muscle tone and A/ROM, able to achieve 4/5 strength with contractions and 10 sec endurance, in order to provide appropriate lumbopelvic support in functional activities.   Baseline:  Goal status:IN PROGRESS  4.  Pt will report low back pain no higher than 2/10. Baseline:  Goal status: IN PROGRESS   PLAN:  PT FREQUENCY: 1-2x/week  PT DURATION: 6 months  PLANNED INTERVENTIONS: Therapeutic exercises, Therapeutic activity, Neuromuscular re-education, Balance training, Gait training, Patient/Family education, Self Care, Joint mobilization, Dry Needling, Biofeedback, and Manual therapy  PLAN FOR NEXT SESSION: Continue to address thoracolumbar restriction; myofascial stretch release; progress  core strengthening; re-evaluation      Julio Alm, PT, DPT10/02/241:12 PM

## 2023-01-18 ENCOUNTER — Ambulatory Visit: Payer: Commercial Managed Care - HMO

## 2023-01-18 DIAGNOSIS — M5459 Other low back pain: Secondary | ICD-10-CM

## 2023-01-18 DIAGNOSIS — M62838 Other muscle spasm: Secondary | ICD-10-CM

## 2023-01-18 DIAGNOSIS — R293 Abnormal posture: Secondary | ICD-10-CM

## 2023-01-18 DIAGNOSIS — M6281 Muscle weakness (generalized): Secondary | ICD-10-CM

## 2023-01-18 DIAGNOSIS — R279 Unspecified lack of coordination: Secondary | ICD-10-CM

## 2023-01-18 NOTE — Therapy (Signed)
OUTPATIENT PHYSICAL THERAPY FEMALE PELVIC TREATMENT   Patient Name: Judith MCCONAHY MRN: [id number] DOB:1970-10-16, 52 y.o., female Today's Date: 01/18/2023  END OF SESSION:  PT End of Session - 01/18/23 1157     Visit Number 8    Date for PT Re-Evaluation 04/12/23    Authorization Type [employer]    PT Start Time 1155    PT Stop Time 1227    PT Time Calculation (min) 32 min    Activity Tolerance Patient tolerated treatment well    Behavior During Therapy [facility] for tasks assessed/performed                   Past Medical History:  Diagnosis Date   Abdominal pain, epigastric 11/20/2019   Asthma    as a child   Cervical dysplasia    Gastroesophageal reflux disease 11/20/2019   Genetic testing 02/01/2018   Genetic testing was performed in 2015. The [employer] panel was ordered. In total, 23 genes were analyzed as part of this panel: ATM, BARD1, BRCA1, BRCA2, BRIP1, CDH1, CHEK2, EPCAM, MLH1, MRE11A, MSH2, MSH6, MUTYH, NBN, NF1, PALB2, PMS2, PTEN, RAD50, RAD51C, RAD51D, STK11, and TP53. No pathogenic variants were detected. Genetic testing did detect a Variant of Unknown Significance (VUS) at the t   GERD (gastroesophageal reflux disease)    had during chemo   Headache 06/12/2013   History of chemotherapy    History of kidney stones    History of radiation therapy 11/24/13- 01/08/14   reconstructed right breast/chest wall 4500 cGy 25 sessions, electron beam boost 1440 cGy 8 sessions   Liver masses 12/06/2016   Major depressive disorder, in remission 03/12/2017   Malignant neoplasm of lower-inner quadrant of right breast of female, estrogen receptor positive 2012   Pneumonia    PONV (postoperative nausea and vomiting)    Recurrent cancer of right breast 12/22/2013   Thrombocytopenia 05/22/2013   Past Surgical History:  Procedure Laterality Date   ADENOIDECTOMY     BREAST LUMPECTOMY WITH NEEDLE LOCALIZATION Right 05/05/2013   Procedure: EXCISION RECURRENT CANCER RIGHT BREAST  WITH NEEDLE LOCALOZATION;  Surgeon: [name], MD;  Location: [facility] OR;  Service: General;  Laterality: Right;   BREAST RECONSTRUCTION  06/29/2011   Procedure: BREAST RECONSTRUCTION;  Surgeon: Wayland Denis, DO;  Location: [facility] OR;  Service: Plastics;  Laterality: Bilateral;  Immediate Bilateral Breast Reconstruction with Bilateral Tissue Expanders and placement of  Alloderm    DILATION AND CURETTAGE OF UTERUS     fibroid tumor surgery     lazy eye     corrective surgery   MASTECTOMY W/ SENTINEL NODE BIOPSY  06/29/2011   Procedure: MASTECTOMY WITH SENTINEL LYMPH NODE BIOPSY;  Surgeon: [name], MD;  Location: [facility] OR;  Service: General;  Laterality: Right;  right skin spairing mstectomy and right sentinel lymph node biopsy   PORT-A-CATH REMOVAL  03/12/2012   Procedure: MINOR REMOVAL PORT-A-CATH;  Surgeon: [name], MD;  Location: [facility];  Service: General;  Laterality: N/A;  Upper left   PORTACATH PLACEMENT Left 05/05/2013   Procedure: INSERTION PORT-A-CATH;  Surgeon: [name], MD;  [facility] OR;  Service: General;  Laterality: Left;   RHINOPLASTY     uterine cervical treatments  2002   for precancerous lesions   Patient Active Problem List   Diagnosis Date Noted   Constipation 05/25/2022   Other fatigue 05/25/2022   Dizziness 05/25/2022   Endometrial thickening on ultrasound 05/24/2022   Abnormal  laboratory test result 04/25/2022   Snoring 07/25/2021   Excessive daytime sleepiness 07/25/2021   Routine general medical examination at a health care facility 07/08/2021   Positive ANA (antinuclear antibody) 03/18/2021   Left leg pain 03/18/2021   Arthralgia 03/11/2021   Back pain 02/26/2021   Neck pain 02/26/2021   Gastroesophageal reflux disease 11/20/2019   Genetic testing 02/01/2018   Major depressive disorder, in remission 03/12/2017   Liver masses 12/06/2016   Recurrent cancer of right breast 12/22/2013   Headache 06/12/2013    Thrombocytopenia 05/22/2013   Malignant neoplasm of lower-inner quadrant of right breast of female, estrogen receptor positive 2012    PCP: Myrlene Broker, MD  REFERRING PROVIDER: Napoleon Form, MD   REFERRING DIAG: M62.89 (ICD-10-CM) - Pelvic floor dysfunction  THERAPY DIAG:  Muscle weakness (generalized) - Plan: PT plan of care cert/re-cert  Other muscle spasm - Plan: PT plan of care cert/re-cert  Unspecified lack of coordination - Plan: PT plan of care cert/re-cert  Other low back pain - Plan: PT plan of care cert/re-cert  Abnormal posture - Plan: PT plan of care cert/re-cert  Rationale for Evaluation and Treatment: Rehabilitation  ONSET DATE: 1 year  SUBJECTIVE:                                                                                                                                                                                           SUBJECTIVE STATEMENT: Pt states that she tried to eat prunes instead of drinking prune juice and states that she did not have bowel movements and became constipated.   Fluid intake: Yes: drinks a lot of water    PAIN:  Are you having pain? Yes NPRS scale: 4/10 Pain location:  low back pain  Pain type: aching Pain description: intermittent and constant   Aggravating factors: prolonged sitting, constipation Relieving factors: movement  PRECAUTIONS: None  WEIGHT BEARING RESTRICTIONS: No  FALLS:  Has patient fallen in last 6 months? 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, bil mastectomy, osteopenia, fibroid removal in 2004   Sexual abuse: No  BOWEL MOVEMENT: Pain with bowel movement: No, but have been in the past, sometimes will throw up during bowel movement Type of  bowel movement:Frequency every 1-2 weeks, Strain No, and Splinting no Fully empty rectum: No Leakage: No Pads: No Fiber supplement: No, but tries to eat fiber rich foods; she does over the counter  stool softeners, sometimes Miralax Feels like she does not get appropriate urgency to have a bowel movement  URINATION: Pain with urination: No Fully empty bladder: Yes: - Stream:  start and stop Urgency: Yes: but no leaking Frequency: will vary on the day, bad days every 1-2 hours, on a good day every 2-3 hours Leakage:  none Pads: No  INTERCOURSE: Pain with intercourse: Initial Penetration and During Penetration Ability to have vaginal penetration:  Yes: yes, pain and bleeding at posterior fourchette  Climax: sometimes    PREGNANCY: Vaginal deliveries 1 Tearing Yes: episiotomy   OBJECTIVE:  01/18/23:   09/21/22: PALPATION:   General  Abdominal tenderness                External Perineal Exam WNL                             Internal Pelvic Floor WNL  Patient confirms identification and approves PT to assess internal pelvic floor and treatment Yes   PELVIC MMT:   MMT eval  Vaginal 3/5, 11 sec hold, 6 repeat  Internal Anal Sphincter 2/5  External Anal Sphincter 2/5  Puborectalis 2/5  Diastasis Recti WNL  (Blank rows = not tested)        TONE: WNL  PROLAPSE: Gr 1 anterior vaginal wall laxity, gr 1 posterior vaginal wall laxity 08/03/22: DIAGNOSTIC FINDINGS:  MRI, [place], CT scans all of abdomen and pelvis with demonstrating constipation  COGNITION: Overall cognitive status: Within functional limits for tasks assessed     SENSATION: Light touch: Appears intact Proprioception: Appears intact  GAIT: Comments: WNL  POSTURE:  Rt facing sacral location   LUMBARAROM/PROM:  A/PROM A/PROM  Eval (% available)  Flexion 100  Extension 100  Right lateral flexion 100  Left lateral flexion 100  Right rotation 100  Left rotation 100   (Blank rows = not  tested)      TODAY'S TREATMENT:                                                                                                                              DATE:  01/18/23 RE-EVAL Neuromuscular re-education: Seated hip adduction/ball squeeze 2 x 10 with core/pelvic floor muscle contraction Seated march with anterior weight hold 4 lbs 3 x 10 Seated chop 10x bil 4lb  Exercises: Sidestepping red band 3 long laps  3-way kick 10x each bil  01/10/23 Therapeutic activities: Fiber intake - focusing on whole foods  Encouraged to keep drinking prune juice if it is helping - maybe try eating prunes instead of drinking prune juice Using squatty potty even if she is having diarrhea Working on making sure she is relaxed and emptying completely when she has diarrhea Getting out of diarrhea/constipation cycle Strength/resistance training being good for constipation   11/09/22 Manual: Manual trigger point release to lumbar paraspinals/glutes Neuromuscular re-education: Bird-dog 6x bil Seated hip adduction 2 x  10 Seated hip abduction 2 x 10 Seated march yellow band 2 x 10 Exercises: Cat cow 2 x 10 Child's pose 10 breaths with lateral reach each side     PATIENT EDUCATION:  Education details: See above Person educated: Patient Education method: Explanation, Demonstration, Tactile cues, Verbal cues, and Handouts Education comprehension: verbalized understanding  HOME EXERCISE PROGRAM: [id number]  ASSESSMENT:  CLINICAL IMPRESSION: Pt was seeing progress with bowel movements when she was drinking prune juice and taking fiber. She reported stools that were too loose and frequent while doing this. To help, she started eating prunes instead over the last week and stopped having bowel movements and only had 1-2 in the last week. She is overall reporting the same level of pain in low back even though she states that she feels like there has been progress. Believe that progressing core  strengthening activities will help improve pain in low back and improve gut motility. We are going to focus on this throughout our next several treatment sessions. She tolerated all strengthening progressions today very well, but did require cues to slow exercises and focus on appropriate core activation as she has tendency to lose control over anterior core and increase lumbar extension. She was encouraged to go back to prune She will continue to benefit from skilled PT intervention in order to improve frequency and ease of bowel movements, decrease low back pain, implement functional strengthening program for core and pelvic floor with appropriate pressure management, and decrease dyspareunia.   OBJECTIVE IMPAIRMENTS: decreased activity tolerance, decreased coordination, decreased endurance, decreased strength, increased fascial restrictions, increased muscle spasms, impaired tone, postural dysfunction, and pain.   ACTIVITY LIMITATIONS:  bowel movements   PARTICIPATION LIMITATIONS:  functional activities  PERSONAL FACTORS: 1 comorbidity: medical history  are also affecting patient's functional outcome.   REHAB POTENTIAL: Good  CLINICAL DECISION MAKING: Stable/uncomplicated  EVALUATION COMPLEXITY: Low   GOALS: Goals reviewed with patient? Yes  SHORT TERM GOALS: Target date: 09/14/22 - updated 09/21/22 - 01/10/2023 - updated 01/18/23  Pt will be independent with HEP.   Baseline: Goal status: MET 01/10/23  2.  Pt will be independent with use of squatty potty, relaxed toileting mechanics, and improved bowel movement techniques in order to increase ease of bowel movements and complete evacuation.   Baseline:  Goal status:Met 01/10/23  3.  Pt will be independent with diaphragmatic breathing and down training activities in order to improve pelvic floor relaxation.  Baseline:  Goal status: MET 01/10/23    LONG TERM GOALS: Target date: 01/18/2023 - updated 09/21/22 - updated 01/10/2023 -  updated 01/18/23  Pt will be independent with advanced HEP.   Baseline:  Goal status:IN PROGRESS  2.  Pt will increase frequency of bowel movements to 3-4x/week in order to improve comfort.  Baseline: 1-2 this last week (was doing better before trying to eat prunes vs drink prune juice) 01/18/23 Goal status: IN PROGRESS  3.  Pt will demonstrate normal pelvic floor muscle tone and A/ROM, able to achieve 4/5 strength with contractions and 10 sec endurance, in order to provide appropriate lumbopelvic support in functional activities.   Baseline: not formally assessed today 01/18/23 Goal status:IN PROGRESS  4.  Pt will report low back pain no higher than 2/10. Baseline: currently 4/10, has gotten up to 7/10 in the last week 01/18/23 Goal status: IN PROGRESS   PLAN:  PT FREQUENCY: 1-2x/week  PT DURATION: 12 weeks  PLANNED INTERVENTIONS: Therapeutic exercises, Therapeutic activity, Neuromuscular re-education, Balance training, Gait training,  Patient/Family education, Self Care, Joint mobilization, Dry Needling, Biofeedback, and Manual therapy  PLAN FOR NEXT SESSION: Continue to address thoracolumbar restriction; myofascial stretch release; progress core strengthening     Julio Alm, PT, DPT10/01/2411:31 PM

## 2023-01-25 ENCOUNTER — Ambulatory Visit: Payer: Commercial Managed Care - HMO

## 2023-01-25 DIAGNOSIS — M6281 Muscle weakness (generalized): Secondary | ICD-10-CM

## 2023-01-25 DIAGNOSIS — M5459 Other low back pain: Secondary | ICD-10-CM

## 2023-01-25 DIAGNOSIS — R279 Unspecified lack of coordination: Secondary | ICD-10-CM

## 2023-01-25 DIAGNOSIS — R293 Abnormal posture: Secondary | ICD-10-CM

## 2023-01-25 DIAGNOSIS — M62838 Other muscle spasm: Secondary | ICD-10-CM

## 2023-01-25 NOTE — Therapy (Signed)
OUTPATIENT PHYSICAL THERAPY FEMALE PELVIC TREATMENT   Patient Name: [name] MRN: [id number] DOB:1970/06/12, 52 y.o., female Today's Date: 01/25/2023  END OF SESSION:  PT End of Session - 01/25/23 1155     Visit Number 9    Date for PT Re-Evaluation 04/12/23    Authorization Type [employer]    PT Start Time 1151    PT Stop Time 1229    PT Time Calculation (min) 38 min    Activity Tolerance Patient tolerated treatment well    Behavior During Therapy [facility] for tasks assessed/performed                   Past Medical History:  Diagnosis Date   Abdominal pain, epigastric 11/20/2019   Asthma    as a child   Cervical dysplasia    Gastroesophageal reflux disease 11/20/2019   Genetic testing 02/01/2018   Genetic testing was performed in 2015. The [employer] OvaNext panel was ordered. In total, 23 genes were analyzed as part of this panel: ATM, BARD1, BRCA1, BRCA2, BRIP1, CDH1, CHEK2, EPCAM, MLH1, MRE11A, MSH2, MSH6, MUTYH, NBN, NF1, PALB2, PMS2, PTEN, RAD50, RAD51C, RAD51D, STK11, and TP53. No pathogenic variants were detected. Genetic testing did detect a Variant of Unknown Significance (VUS) at the t   GERD (gastroesophageal reflux disease)    had during chemo   Headache 06/12/2013   History of chemotherapy    History of kidney stones    History of radiation therapy 11/24/13- 01/08/14   reconstructed right breast/chest wall 4500 cGy 25 sessions, electron beam boost 1440 cGy 8 sessions   Liver masses 12/06/2016   Major depressive disorder, in remission 03/12/2017   Malignant neoplasm of lower-inner quadrant of right breast of female, estrogen receptor positive 2012   Pneumonia    PONV (postoperative nausea and vomiting)    Recurrent cancer of right breast 12/22/2013   Thrombocytopenia 05/22/2013   Past Surgical History:  Procedure Laterality Date   ADENOIDECTOMY     BREAST LUMPECTOMY WITH NEEDLE LOCALIZATION Right 05/05/2013   Procedure: EXCISION RECURRENT CANCER RIGHT BREAST  WITH NEEDLE LOCALOZATION;  Surgeon: [name], MD;  Location: [facility] OR;  Service: General;  Laterality: Right;   BREAST RECONSTRUCTION  06/29/2011   Procedure: BREAST RECONSTRUCTION;  Surgeon: Wayland Denis, DO;  Location: [facility] OR;  Service: Plastics;  Laterality: Bilateral;  Immediate Bilateral Breast Reconstruction with Bilateral Tissue Expanders and placement of  Alloderm    DILATION AND CURETTAGE OF UTERUS     fibroid tumor surgery     lazy eye     corrective surgery   MASTECTOMY W/ SENTINEL NODE BIOPSY  06/29/2011   Procedure: MASTECTOMY WITH SENTINEL LYMPH NODE BIOPSY;  Surgeon: [name], MD;  Location: [facility] OR;  Service: General;  Laterality: Right;  right skin spairing mstectomy and right sentinel lymph node biopsy   PORT-A-CATH REMOVAL  03/12/2012   Procedure: MINOR REMOVAL PORT-A-CATH;  Surgeon: [name], MD;  Location: [facility];  Service: General;  Laterality: N/A;  Upper left   PORTACATH PLACEMENT Left 05/05/2013   Procedure: INSERTION PORT-A-CATH;  Surgeon: [name], MD;  Location: [facility] OR;  Service: General;  Laterality: Left;   RHINOPLASTY     uterine cervical treatments  2002   for precancerous lesions   Patient Active Problem List   Diagnosis Date Noted   Constipation 05/25/2022   Other fatigue 05/25/2022   Dizziness 05/25/2022   Endometrial thickening on ultrasound 05/24/2022   Abnormal  laboratory test result 04/25/2022   Snoring 07/25/2021   Excessive daytime sleepiness 07/25/2021   Routine general medical examination at a health care facility 07/08/2021   Positive ANA (antinuclear antibody) 03/18/2021   Left leg pain 03/18/2021   Arthralgia 03/11/2021   Back pain 02/26/2021   Neck pain 02/26/2021   Gastroesophageal reflux disease 11/20/2019   Genetic testing 02/01/2018   Major depressive disorder, in remission 03/12/2017   Liver masses 12/06/2016   Recurrent cancer of right breast 12/22/2013   Headache 06/12/2013    Thrombocytopenia 05/22/2013   Malignant neoplasm of lower-inner quadrant of right breast of female, estrogen receptor positive 2012    PCP: Myrlene Broker, MD  REFERRING PROVIDER: Napoleon Form, MD   REFERRING DIAG: M62.89 (ICD-10-CM) - Pelvic floor dysfunction  THERAPY DIAG:  Muscle weakness (generalized)  Other muscle spasm  Unspecified lack of coordination  Other low back pain  Abnormal posture  Rationale for Evaluation and Treatment: Rehabilitation  ONSET DATE: 1 year  SUBJECTIVE:                                                                                                                                                                                           SUBJECTIVE STATEMENT: Pt states that she has been very bloated. She is working hard on getting fiber in. She has had 2 bowel movements in the last week and feels like she needs the prune juice.  Fluid intake: Yes: drinks a lot of water    PAIN:  Are you having pain? Yes NPRS scale: 4/10 Pain location:  low back pain  Pain type: aching Pain description: intermittent and constant   Aggravating factors: prolonged sitting, constipation Relieving factors: movement  PRECAUTIONS: None  WEIGHT BEARING RESTRICTIONS: No  FALLS:  Has patient fallen in last 6 months? 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, bil mastectomy, osteopenia, fibroid removal in 2004   Sexual abuse: No  BOWEL MOVEMENT: Pain with bowel movement: No, but have been in the past, sometimes will throw up during bowel movement Type of bowel movement:Frequency every 1-2 weeks, Strain No, and Splinting no Fully empty rectum: No Leakage: No Pads: No Fiber supplement: No, but tries to eat fiber  rich foods; she does over the counter stool softeners, sometimes Miralax Feels like she does not get appropriate urgency to have a bowel movement  URINATION: Pain with urination: No Fully empty bladder: Yes: -  Stream:  start and stop Urgency: Yes: but no leaking Frequency: will vary on the day, bad days every 1-2 hours, on a good day every 2-3 hours Leakage:  none Pads: No  INTERCOURSE: Pain with intercourse: Initial Penetration and During Penetration Ability to have vaginal penetration:  Yes: yes, pain and bleeding at posterior fourchette  Climax: sometimes    PREGNANCY: Vaginal deliveries 1 Tearing Yes: episiotomy   OBJECTIVE:  01/18/23:   09/21/22: PALPATION:   General  Abdominal tenderness                External Perineal Exam WNL                             Internal Pelvic Floor WNL  Patient confirms identification and approves PT to assess internal pelvic floor and treatment Yes   PELVIC MMT:   MMT eval  Vaginal 3/5, 11 sec hold, 6 repeat  Internal Anal Sphincter 2/5  External Anal Sphincter 2/5  Puborectalis 2/5  Diastasis Recti WNL  (Blank rows = not tested)        TONE: WNL  PROLAPSE: Gr 1 anterior vaginal wall laxity, gr 1 posterior vaginal wall laxity 08/03/22: DIAGNOSTIC FINDINGS:  MRI, [place], CT scans all of abdomen and pelvis with demonstrating constipation  COGNITION: Overall cognitive status: Within functional limits for tasks assessed     SENSATION: Light touch: Appears intact Proprioception: Appears intact  GAIT: Comments: WNL  POSTURE:  Rt facing sacral location   LUMBARAROM/PROM:  A/PROM A/PROM  Eval (% available)  Flexion 100  Extension 100  Right lateral flexion 100  Left lateral flexion 100  Right rotation 100  Left rotation 100   (Blank rows = not tested)      TODAY'S TREATMENT:                                                                                                                              DATE:   01/24/26 Neuromuscular re-education: Kneeling chop 2 x 10 bil 5lbs Kneeling shoulder flexion 2 x 10 5lbs  Exercises: 3-way kick 2 x 10 each, bil Therapeutic activities: Lunges on BOSU 10x bil Lateral lunges on BOSU 10x bil Unilateral row 10x bil    01/18/23 RE-EVAL Neuromuscular re-education: Seated hip adduction/ball squeeze 2 x 10 with core/pelvic floor muscle contraction Seated march with anterior weight hold 4 lbs 3 x 10 Seated chop 10x bil 4lb  Exercises: Sidestepping red band 3 long laps  3-way kick 10x each bil  01/10/23 Therapeutic activities: Fiber intake - focusing on whole foods  Encouraged to keep drinking prune juice if it is helping - maybe try eating prunes instead of drinking prune juice Using squatty potty even if she is having diarrhea Working on making sure she is relaxed and emptying completely when she has diarrhea Getting out of diarrhea/constipation cycle Strength/resistance training being good for constipation   PATIENT  EDUCATION:  Education details: See above Person educated: Patient Education method: Explanation, Demonstration, Tactile cues, Verbal cues, and Handouts Education comprehension: verbalized understanding  HOME EXERCISE PROGRAM: [id number]  ASSESSMENT:  CLINICAL IMPRESSION: Pt states that she gets frustrated with having to take supplements or specific dietary items in order to have bowel movements. She was encouraged that having to eat certain foods is ok in order to have good bowel movements. She did well with progression of previous exercises to have increased resistance and more challenging position. She also tolerated progressions to lunges, lateral lunges, and unilateral row to help challenge core. She will continue to benefit from skilled PT intervention in order to improve frequency and ease of bowel movements, decrease low back pain, implement functional strengthening program for core and pelvic floor with appropriate pressure  management, and decrease dyspareunia.   OBJECTIVE IMPAIRMENTS: decreased activity tolerance, decreased coordination, decreased endurance, decreased strength, increased fascial restrictions, increased muscle spasms, impaired tone, postural dysfunction, and pain.   ACTIVITY LIMITATIONS:  bowel movements   PARTICIPATION LIMITATIONS:  functional activities  PERSONAL FACTORS: 1 comorbidity: medical history  are also affecting patient's functional outcome.   REHAB POTENTIAL: Good  CLINICAL DECISION MAKING: Stable/uncomplicated  EVALUATION COMPLEXITY: Low   GOALS: Goals reviewed with patient? Yes  SHORT TERM GOALS: Target date: 09/14/22 - updated 09/21/22 - 01/10/2023 - updated 01/18/23  Pt will be independent with HEP.   Baseline: Goal status: MET 01/10/23  2.  Pt will be independent with use of squatty potty, relaxed toileting mechanics, and improved bowel movement techniques in order to increase ease of bowel movements and complete evacuation.   Baseline:  Goal status:Met 01/10/23  3.  Pt will be independent with diaphragmatic breathing and down training activities in order to improve pelvic floor relaxation.  Baseline:  Goal status: MET 01/10/23    LONG TERM GOALS: Target date: 01/18/2023 - updated 09/21/22 - updated 01/10/2023 - updated 01/18/23  Pt will be independent with advanced HEP.   Baseline:  Goal status:IN PROGRESS  2.  Pt will increase frequency of bowel movements to 3-4x/week in order to improve comfort.  Baseline: 1-2 this last week (was doing better before trying to eat prunes vs drink prune juice) 01/18/23 Goal status: IN PROGRESS  3.  Pt will demonstrate normal pelvic floor muscle tone and A/ROM, able to achieve 4/5 strength with contractions and 10 sec endurance, in order to provide appropriate lumbopelvic support in functional activities.   Baseline: not formally assessed today 01/18/23 Goal status:IN PROGRESS  4.  Pt will report low back pain no higher  than 2/10. Baseline: currently 4/10, has gotten up to 7/10 in the last week 01/18/23 Goal status: IN PROGRESS   PLAN:  PT FREQUENCY: 1-2x/week  PT DURATION: 12 weeks  PLANNED INTERVENTIONS: Therapeutic exercises, Therapeutic activity, Neuromuscular re-education, Balance training, Gait training, Patient/Family education, Self Care, Joint mobilization, Dry Needling, Biofeedback, and Manual therapy  PLAN FOR NEXT SESSION: Continue to address thoracolumbar restriction; myofascial stretch release; progress core strengthening     Julio Alm, PT, DPT10/17/2412:30 PM

## 2023-01-30 ENCOUNTER — Encounter (HOSPITAL_BASED_OUTPATIENT_CLINIC_OR_DEPARTMENT_OTHER): Payer: Self-pay | Admitting: Obstetrics & Gynecology

## 2023-01-30 ENCOUNTER — Other Ambulatory Visit (HOSPITAL_BASED_OUTPATIENT_CLINIC_OR_DEPARTMENT_OTHER): Payer: Self-pay | Admitting: Obstetrics & Gynecology

## 2023-01-30 ENCOUNTER — Ambulatory Visit (HOSPITAL_BASED_OUTPATIENT_CLINIC_OR_DEPARTMENT_OTHER): Payer: Managed Care, Other (non HMO) | Admitting: Obstetrics & Gynecology

## 2023-01-30 ENCOUNTER — Encounter: Payer: Self-pay | Admitting: Genetic Counselor

## 2023-01-30 VITALS — BP 102/72 | HR 61 | Ht 63.0 in | Wt 125.0 lb

## 2023-01-30 DIAGNOSIS — Z8742 Personal history of other diseases of the female genital tract: Secondary | ICD-10-CM | POA: Diagnosis not present

## 2023-01-30 DIAGNOSIS — R935 Abnormal findings on diagnostic imaging of other abdominal regions, including retroperitoneum: Secondary | ICD-10-CM

## 2023-01-30 DIAGNOSIS — Z853 Personal history of malignant neoplasm of breast: Secondary | ICD-10-CM | POA: Diagnosis not present

## 2023-01-30 DIAGNOSIS — Z170 Estrogen receptor positive status [ER+]: Secondary | ICD-10-CM

## 2023-01-30 DIAGNOSIS — N840 Polyp of corpus uteri: Secondary | ICD-10-CM

## 2023-01-30 DIAGNOSIS — Z8049 Family history of malignant neoplasm of other genital organs: Secondary | ICD-10-CM

## 2023-01-30 NOTE — Progress Notes (Signed)
GYNECOLOGY  VISIT  CC:   surgical consult  HPI: 52 y.o. [id number] Married White or Caucasian female here for discussion of possible surgery due to abnormal ultrasound showing thickened endometrium.  Pt did have normal biopsy 05/2022 for additional evaluation of this finding.  Ultrasounds of the endometrium done due to prior Tamoxifen use.  Has been off Tamoxifen for several years.  Ultrasound in 2019 showed endometrium of 4mm.  In 2022, endometrium was 7mm with  a few tiny cystic foci.  Ovaries normal.  (Images reviewed personally.)  Ultrasound repeated in 04/2022 with endometrium now 9mm with cystic areas.  (These images also personally reviewed).  Ovaries still small and normal.     She initially discussed consideration of complete hysterectomy.  She has several reasons to consider this surgical option in addition to the abnormal findings noted above.  She has a hx of breast cancer with recurrent in 2015.  Initial diagnoses made in 2012 when she has a 2.0cm invasive ductal carcinoma.  Lymph node biopsy was negative.  Tumor was ER/PR, Her-2/neu positive.  She was treated with chemotherapy and then trastuzumab for 1 year.  She underwent bilateral mastectomies with SNB 06/29/2011 due to residual positive tumor.  Was treated with Tamoxifen 3/13 to early 2015 but stopped due to recurrence.  Had lumpectomy and then chemotherapy again.  Genetic testing was negative although there was an ATM gene variant of uncertain significance.  This has been reclassified as "likely benign" since 2019.  23 genes were tested.  Actual report was searched for in the EMR and not found.  Will reach out to genetics to see if full report can be entered into the EMR.    Pt also reports a hx of prior abnormal pap smears that was treated with conization.  She's had normalization of pap smears with several normal HR HPV tests as well.  Reviewed pap smears for last 10 years in EPIC.    Lastly, she reports a family hx of endometrial cancer in  her maternal grandmother.  MGM was diagnosed in her mid 44's and had complete hysterectomy.  She lived until age 26.  There was a concern about recurrence at that time.  She notes her grandmother had dementia at the time of surgery but was worse after the surgery from a cognition standpoint.    Overall, she has concerns about having another gynecological cancer.  We discussed her risks of cervical cancer are low given normalization of pap smears and negative HR HPV testing over many years.  As well, with genetic testing, her family history of endometrial cancer may not be as impactful as if she didn't have genetic testing done.  Will need to be sure lynch syndrome testing has been completed.  We also discussed risks of cardiovascular disease related to ovary removal.  Given all of this, I currently feel hysterectomy is more surgery than she currently need.    We discussed need for additional evaluation of endometrium due to abnormal thickening and cystic spaces.  Discussed this is likely a endometrial polyp but would initially recommend hysteroscopy with possible polyp resection, D&C.  Procedure discussed with patient.  Recovery and pain management discussed.  Risks discussed including but not limited to bleeding, infection, 1% risk of uterine perforation with risks of fluid deficit and/or need to stop procedure early.  Patient aware if pathology abnormal she may need additional treatment.  All questions answered.  She is comfortable proceeding with this and then discussing pros/cons of hysterectomy.  Past Medical History:  Diagnosis Date   Abdominal pain, epigastric 11/20/2019   Asthma    as a child   Cervical dysplasia    Gastroesophageal reflux disease 11/20/2019   Genetic testing 02/01/2018   Genetic testing was performed in 2015. The [employer] OvaNext panel was ordered. In total, 23 genes were analyzed as part of this panel: ATM, BARD1, BRCA1, BRCA2, BRIP1, CDH1, CHEK2, EPCAM, MLH1, MRE11A, MSH2,  MSH6, MUTYH, NBN, NF1, PALB2, PMS2, PTEN, RAD50, RAD51C, RAD51D, STK11, and TP53. No pathogenic variants were detected. Genetic testing did detect a Variant of Unknown Significance (VUS) at the t   GERD (gastroesophageal reflux disease)    had during chemo   Headache 06/12/2013   History of chemotherapy    History of kidney stones    History of radiation therapy 11/24/13- 01/08/14   reconstructed right breast/chest wall 4500 cGy 25 sessions, electron beam boost 1440 cGy 8 sessions   Liver masses 12/06/2016   Major depressive disorder, in remission 03/12/2017   Malignant neoplasm of lower-inner quadrant of right breast of female, estrogen receptor positive 2012   Pneumonia    PONV (postoperative nausea and vomiting)    Recurrent cancer of right breast 12/22/2013   Thrombocytopenia 05/22/2013    MEDS:   Current Outpatient Medications on File Prior to Visit  Medication Sig Dispense Refill   Acetaminophen-Caffeine (EXCEDRIN TENSION HEADACHE PO) Take by mouth.     Biotin 5000 MCG CAPS Take 5,000 mcg by mouth daily.     Digestive Enzyme CAPS Take by mouth.     ibuprofen (ADVIL) 800 MG tablet TAKE 1 TABLET BY MOUTH EVERY 8 HOURS AS NEEDED 30 tablet 5   linaclotide (LINZESS) 145 MCG CAPS capsule Take 1 capsule (145 mcg total) by mouth daily before breakfast. 30 capsule 6   Prenat-Fe Poly-Methfol-FA-DHA (VITAFOL FE+) 90-0.6-0.4-200 MG CAPS Take 1 tablet by mouth daily. 30 capsule 11   No current facility-administered medications on file prior to visit.    ALLERGIES: Tussionex pennkinetic er [hydrocod poli-chlorphe poli er], Morphine, Morphine and codeine, Penicillin g, and Penicillins  SH:  married, non smoker  Review of Systems  Constitutional: Negative.   Genitourinary: Negative.     PHYSICAL EXAMINATION:    BP 102/72 (BP Location: Left Arm, Patient Position: Sitting, Cuff Size: Normal)   Pulse 61   Ht 5\' 3"  (1.6 m) Comment: Reported  Wt 125 lb (56.7 kg) Comment: Reported  LMP  03/11/2011   BMI 22.14 kg/m     Physical Exam Constitutional:      Appearance: Normal appearance.  Neurological:     General: No focal deficit present.     Mental Status: She is alert.  Psychiatric:        Mood and Affect: Mood normal.        Behavior: Behavior normal.     Assessment/Plan: 1. Abnormal ultrasound of endometrium - will proceed with scheduling hysteroscopy with possible polyp resection, D&C - Ambulatory Referral For Surgery Scheduling  2. Endometrial polyp - Ambulatory Referral For Surgery Scheduling  3. Malignant neoplasm of lower-inner quadrant of right breast of female, estrogen receptor positive - followed by oncology  4. History of abnormal cervical Pap smear  5. Family history of uterine cancer - will check with genetics to see if lynch testing has been done  Total time with pt:  28 minutes Documentation time:  20 minutes Total time:  48 minutes

## 2023-02-01 ENCOUNTER — Ambulatory Visit: Payer: Commercial Managed Care - HMO

## 2023-02-06 ENCOUNTER — Other Ambulatory Visit (HOSPITAL_BASED_OUTPATIENT_CLINIC_OR_DEPARTMENT_OTHER): Payer: Self-pay | Admitting: Obstetrics & Gynecology

## 2023-02-06 ENCOUNTER — Telehealth: Payer: Self-pay

## 2023-02-06 DIAGNOSIS — R935 Abnormal findings on diagnostic imaging of other abdominal regions, including retroperitoneum: Secondary | ICD-10-CM

## 2023-02-06 DIAGNOSIS — N840 Polyp of corpus uteri: Secondary | ICD-10-CM

## 2023-02-06 NOTE — Telephone Encounter (Signed)
Called patient to see if she's available for surgery w/ Dr. Hyacinth Meeker on 03/20/23 @1  pm. Left voicemail requesting a call back at [phone].

## 2023-02-07 ENCOUNTER — Telehealth: Payer: Self-pay

## 2023-02-07 ENCOUNTER — Encounter (HOSPITAL_BASED_OUTPATIENT_CLINIC_OR_DEPARTMENT_OTHER): Payer: Self-pay

## 2023-02-07 NOTE — Telephone Encounter (Signed)
Patient called back to schedule surgery w/ Dr. Hyacinth Meeker on 03/20/23 @[facility] at 1 pm. Patient is aware she must arrive at 11 am on 03/20/23. Pre-op and surgery details were provided by phone. Patient confirmed understanding.

## 2023-02-07 NOTE — Telephone Encounter (Signed)
-----   Message from Jerene Bears sent at 01/31/2023 10:22 AM EDT ----- Regarding: surgery scheduling Malay Fantroy, This pt needs to be scheduled for hysteroscopy with possible polyp resection, dilation and curettage.  Dx:  abnormal endometrium on ultrasound, endometrial polyp.  Assistant:  none.  Special requests:  myosure hysteroscope.  Thank you.  Lum Keas, MD

## 2023-02-08 ENCOUNTER — Ambulatory Visit: Payer: Commercial Managed Care - HMO

## 2023-02-08 NOTE — Progress Notes (Signed)
Office Visit Note  Patient: [name]             Date of Birth: [birth year]           MRN: [id number]             PCP: Myrlene Broker, MD Referring: Myrlene Broker, * Visit Date: 02/22/2023   Subjective:  Follow-up   History of Present Illness: [name] is a 52 y.o. female here for follow up for joint pain and fatigue with persistent positive ANA.  She has not had any major new acute illness or complications.  Still experiencing leg swelling with sensitivity at affected areas.  Skin is dry all over breaks out with plaques and patches on the face for which she is prescribed topical steroid from dermatology.  In September she had updated bone density scan checked consistent with osteoporosis.  Has history of breast cancer including tamoxifen treatment.  No history of fractures.  She has been taking a vitamin D supplement with last level of 9 in July.  Previous HPI 08/24/2022 [name] is a 52 y.o. female here for follow up for joint pain and fatigue with positive ANA no specific connective tissue disease identified at evaluation so far.  Since our last visit she had reassuring pathology results for the thickened endometrial biopsy.  Subsequently followed with gastroenterology she had additional abdominal imaging with the finding of multiple liver cysts with labs consistently showing normal liver function.  She does have constipation does not have severe abdominal pain.  Does have some dysuria but does not notice any blood or discoloration or odor.  Ongoing back pain positive for both lying at rest and sometimes up and moving no radiation or numbness in the legs.   Previous HPI 05/25/22 [name] is a 51 y.o. female here for follow up for joint pain and fatigue with positive ANA testing.  Lab workup at our initial visit was entirely negative for specific antibodies but showed ANA consistent positive at a higher titer than before.  Also had normal thyroid  function.  Since that visit she has had some additional workup with abdominal MRI due to worsened bloating abdominal discomfort and unintentional weight loss.  Also had endometrial biopsy yesterday due to thickening and cystic changes noted on transvaginal ultrasound exam.  Besides these she continues having aches and pains most prominently throughout her legs on a daily basis.  Does see distal leg swelling usually by the end of the day without any overlying skin rash or discoloration.  Ongoing eye redness and irritation and feels dry most of the time, she thinks there may be some discoloration but no new changes in visual acuity.  Still has excessive fatigue regardless of sleep pattern.  She is experiencing periods of dizziness often provoked with time spent outdoors but sometimes just with positional change during no particular activity.     Previous HPI 04/25/22 [name] is a 52 y.o. female here for evaluation of positive ANA checked in association with ongoing symptoms including joint pain and fatigue most predominantly.  She has had some chronic issues with back pain and multiple levels attributed to some degenerative joint disease in the spine that is more longstanding.  However for about the past 1 year she has increase in symptoms and also experiencing some more pain in other areas her extremities and in bilateral hips.  There is more tenderness to pressure in the distal legs on both sides  below the level of the knee.  She notices some possible swelling involving lower extremities but none localized to specific joint areas.  She does not notice any particular erythema or warmth in affected areas.  She tends to notice worsening after period of increased activity or exertion taking 1 or 2 days to recover from fatigue and aches.  She was taking Excedrin which was partially helpful for symptoms but stopped due to irritation with upper abdominal discomfort. Outside of joint problems she experiences  persistent fatigue with a feeling of very low energy level.  She does not notice any particular sleep disruption and is not sleepy or taking naps during the daytime.   She has chronic gastrointestinal symptoms with pretty severe constipation predominant irritable bowel symptoms.  She also has some history of GERD without known upper GI complication.  She has had multiple liver masses present on CT imaging but with no high risk features identified and negative for increased metabolic activity on PET scan.   She reports generalized alopecia with some hair thinning or breaking without any focal bald spots or rash or scarring on the scalp.  Skin sensitivity to light with occasional erythema but no persistent rashes.  She has some chronic dryness affecting eyes and mouth no history of oral or nasal ulcers.  No persistent lymphadenopathy.  She denies typical Raynaud's symptoms with no fingernail changes.   Breast cancer Hx Delberta is a 52 year old woman with h/o ER positive breast cancer diagnosed in 2012 s/p bilateral mastectomies, chemotherapy, and tamoxifen. She developed recurrence in 2015, stage IA triple positive breast cancer s/p TDM1, adjuvant radiation, and unable to tolerate long term antiestrogen therapy.    Labs reviewed 02/2021 ANA 1:40 homogenous dsDNA, Sm, SSA, SSB, Scl-70 Abs neg RF neg   Imaging reviewed 01/20/21 MRI Lumbar spine Disc levels: T12-L1: Negative. L1-2: Negative. L2-3: Negative. L3-4: A rightward disc protrusion is present. Mild facet hypertrophy is noted bilaterally. Mild right foraminal narrowing is new. The central canal and left foramen are patent. L4-5: A broad-based disc protrusion is asymmetric to the right. Mild facet hypertrophy and ligamentum flavum thickening are noted. Moderate right subarticular narrowing is present. Progressive moderate foraminal narrowing is noted bilaterally, right greater than left. L5-S1: Mild facet hypertrophy is noted bilaterally. No  significant disc protrusion or stenosis is present.   03/21/21 Xray left knee Trace patellofemoral spurring. Otherwise unremarkable radiographic appearance.   Review of Systems  Constitutional:  Positive for fatigue.  HENT:  Positive for mouth sores and mouth dryness.   Eyes:  Positive for dryness.  Respiratory:  Positive for shortness of breath.   Cardiovascular:  Negative for chest pain and palpitations.  Gastrointestinal:  Positive for constipation. Negative for blood in stool and diarrhea.  Endocrine: Negative for increased urination.  Genitourinary:  Negative for involuntary urination.  Musculoskeletal:  Positive for joint pain, joint pain, joint swelling, myalgias, muscle weakness, morning stiffness, muscle tenderness and myalgias. Negative for gait problem.  Skin:  Positive for hair loss. Negative for color change, rash and sensitivity to sunlight.  Allergic/Immunologic: Negative for susceptible to infections.  Neurological:  Positive for dizziness and headaches.  Hematological:  Negative for swollen glands.  Psychiatric/Behavioral:  Positive for depressed mood and sleep disturbance. The patient is nervous/anxious.     PMFS History:  Patient Active Problem List   Diagnosis Date Noted   Osteoporosis due to aromatase inhibitor 02/22/2023   Constipation 05/25/2022   Other fatigue 05/25/2022   Dizziness 05/25/2022  Endometrial thickening on ultrasound 05/24/2022   Abnormal laboratory test result 04/25/2022   Snoring 07/25/2021   Excessive daytime sleepiness 07/25/2021   Routine general medical examination at a health care facility 07/08/2021   Positive ANA (antinuclear antibody) 03/18/2021   Left leg pain 03/18/2021   Arthralgia 03/11/2021   Back pain 02/26/2021   Neck pain 02/26/2021   Gastroesophageal reflux disease 11/20/2019   Genetic testing 02/01/2018   Major depressive disorder, in remission 03/12/2017   Liver masses 12/06/2016   Recurrent cancer of right breast  12/22/2013   Headache 06/12/2013   Thrombocytopenia 05/22/2013   Malignant neoplasm of lower-inner quadrant of right breast of female, estrogen receptor positive 2012    Past Medical History:  Diagnosis Date   Abdominal pain, epigastric 11/20/2019   Asthma    as a child   Cervical dysplasia    Gastroesophageal reflux disease 11/20/2019   Genetic testing 02/01/2018   Genetic testing was performed in 2015. The [employer] panel was ordered. In total, 23 genes were analyzed as part of this panel: ATM, BARD1, BRCA1, BRCA2, BRIP1, CDH1, CHEK2, EPCAM, MLH1, MRE11A, MSH2, MSH6, MUTYH, NBN, NF1, PALB2, PMS2, PTEN, RAD50, RAD51C, RAD51D, STK11, and TP53. No pathogenic variants were detected. Genetic testing did detect a Variant of Unknown Significance (VUS) at the t   GERD (gastroesophageal reflux disease)    had during chemo   Headache 06/12/2013   History of chemotherapy    History of kidney stones    History of radiation therapy 11/24/13- 01/08/14   reconstructed right breast/chest wall 4500 cGy 25 sessions, electron beam boost 1440 cGy 8 sessions   Liver masses 12/06/2016   Major depressive disorder, in remission 03/12/2017   Malignant neoplasm of lower-inner quadrant of right breast of female, estrogen receptor positive 2012   Osteoporosis    Pneumonia    PONV (postoperative nausea and vomiting)    Recurrent cancer of right breast 12/22/2013   Thrombocytopenia 05/22/2013    Family History  Adopted: Yes  Problem Relation Age of Onset   Colon cancer Mother 76   Rectal cancer Mother    Cervical cancer Mother    Heart attack Father    Cancer Maternal Aunt 49       stomach and ovarian as separate primaries   Cancer Maternal Aunt 55       breast   Stomach cancer Maternal Aunt    Cancer Maternal Uncle 67       lung; smoker   Cancer Maternal Uncle 63       prostate   Cancer Maternal Grandmother 50       enodometrial   Cancer Cousin 40       melanoma; mat first cousin, located on  neck   Past Surgical History:  Procedure Laterality Date   ADENOIDECTOMY     BREAST LUMPECTOMY WITH NEEDLE LOCALIZATION Right 05/05/2013   Procedure: EXCISION RECURRENT CANCER RIGHT BREAST WITH NEEDLE LOCALOZATION;  Surgeon: [name], MD;  Location: [facility] OR;  Service: General;  Laterality: Right;   BREAST RECONSTRUCTION  06/29/2011   Procedure: BREAST RECONSTRUCTION;  Surgeon: Wayland Denis, DO;  Location: [facility] OR;  Service: Plastics;  Laterality: Bilateral;  Immediate Bilateral Breast Reconstruction with Bilateral Tissue Expanders and placement of  Alloderm    DILATION AND CURETTAGE OF UTERUS     fibroid tumor surgery     lazy eye     corrective surgery   MASTECTOMY W/ SENTINEL NODE BIOPSY  06/29/2011   Procedure:  MASTECTOMY WITH SENTINEL LYMPH NODE BIOPSY;  Surgeon: [name], MD;  Location: [facility] OR;  Service: General;  Laterality: Right;  right skin spairing mstectomy and right sentinel lymph node biopsy   PORT-A-CATH REMOVAL  03/12/2012   Procedure: MINOR REMOVAL PORT-A-CATH;  Surgeon: [name], MD;  Location: [facility];  Service: General;  Laterality: N/A;  Upper left   PORTACATH PLACEMENT Left 05/05/2013   Procedure: INSERTION PORT-A-CATH;  Surgeon: [name], MD;  Location[name] OR;  Service: General;  Laterality: Left;   RHINOPLASTY     uterine cervical treatments  2002   for precancerous lesions   Social History   Social History Narrative   She was adopted by her grandparents as a child.  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  She works in a Agricultural consultant.right handed   Two story home    Drinks caffeine occasional   Right handed    There is no immunization history on file for this patient.   Objective: Vital Signs: BP 94/67 (BP Location: Left Arm, Patient Position: Sitting, Cuff Size: Normal)   Pulse 69   Resp 14   Ht 5\' 3"  (1.6 m)   Wt 125 lb (56.7 kg)   LMP 03/11/2011   BMI 22.14 kg/m    Physical  Exam HENT:     Mouth/Throat:     Mouth: Mucous membranes are moist.     Pharynx: Oropharynx is clear.  Eyes:     Conjunctiva/sclera: Conjunctivae normal.  Cardiovascular:     Rate and Rhythm: Normal rate and regular rhythm.  Pulmonary:     Effort: Pulmonary effort is normal.     Breath sounds: Normal breath sounds.  Lymphadenopathy:     Cervical: No cervical adenopathy.  Skin:    General: Skin is warm and dry.     Findings: No rash.  Neurological:     Mental Status: She is alert.  Psychiatric:        Mood and Affect: Mood normal.      Musculoskeletal Exam:  Shoulders full ROM no tenderness or swelling Wrists full ROM no tenderness or swelling Fingers full ROM no tenderness or swelling Paraspinal muscle tenderness in thoracic and lumbar spine Hip normal internal and external rotation without pain, no tenderness to lateral hip palpation Knees full ROM no tenderness or swelling Tender area on lateral ankle without visible abnormality   Investigation: No additional findings.  Imaging: No results found.  Recent Labs: Lab Results  Component Value Date   WBC 6.7 10/05/2022   HGB 13.0 10/05/2022   PLT 273 10/05/2022   NA 141 10/05/2022   K 4.2 10/05/2022   CL 105 10/05/2022   CO2 30 10/05/2022   GLUCOSE 76 10/05/2022   BUN 10 10/05/2022   CREATININE 0.80 10/05/2022   BILITOT 0.6 10/05/2022   ALKPHOS 83 10/05/2022   AST 21 10/05/2022   ALT 20 10/05/2022   PROT 6.9 10/05/2022   ALBUMIN 4.4 10/05/2022   CALCIUM 9.8 10/05/2022   GFRAA >60 08/11/2019    Speciality Comments: No specialty comments available.  Procedures:  No procedures performed Allergies: Tussionex pennkinetic er [hydrocod poli-chlorphe poli er], Morphine, Morphine and codeine, Penicillin g, and Penicillins   Assessment / Plan:     Visit Diagnoses: Positive ANA (antinuclear antibody) - Plan: ANA, C3 and C4, Protein Electrophoresis, (serum), PTH, Intact and Calcium, IgG, IgA, IgM, Tissue  transglutaminase, IgA, COMPLETE METABOLIC PANEL WITH GFR, Sedimentation  rate Arthralgia, unspecified joint  Still keeping joint pain and stiffness of multiple areas but no peripheral joint synovitis decreased range of motion or structural changes appreciable on exam.  Has some more axial muscular pain.  Repeating lab workup including the ANA complements SPEP and serum inflammatory markers.  She also is concerned about possible celiac disease noticing association with gluten and inflammation so checking TTG and quantitative immunoglobulins.  Constipation, unspecified constipation type Gastroesophageal reflux disease, unspecified whether esophagitis present  Not tolerant to prolonged oral NSAIDs would also prefer subcu or intravenous route versus long-term oral bisphosphonate therapy with history of GI medicine intolerance.  Chronic back pain, unspecified back location, unspecified back pain laterality  Chronic back pain more consistent with muscular or myofascial pain does not have severe midline problem and no radiculopathy.   Osteoporosis due to aromatase inhibitor  Discussed her low bone density at relatively young age is likely related to her cancer and hormone treatment.  Checking PTH for other secondary causes.  Is already on supplements with very good vitamin D level and relatively active.  She would not be considered severe but due to early age has a large room for benefit.  Look into possible Evenity treatment first to maximize improvement of bone density before any antiresorptive therapy.  Otherwise among antiresorptive would favor Reclast.  Orders: Orders Placed This Encounter  Procedures   ANA   C3 and C4   Protein Electrophoresis, (serum)   PTH, Intact and Calcium   IgG, IgA, IgM   Tissue transglutaminase, IgA   COMPLETE METABOLIC PANEL WITH GFR   Sedimentation rate   No orders of the defined types were placed in this encounter.    Follow-Up Instructions: Return in about  6 months (around 08/22/2023) for ?ANA/OP Rx start f/u 6mos.   Fuller Plan, MD  Note - This record has been created using [employer].  Chart creation errors have been sought, but may not always  have been located. Such creation errors do not reflect on  the standard of medical care.

## 2023-02-12 ENCOUNTER — Other Ambulatory Visit (HOSPITAL_BASED_OUTPATIENT_CLINIC_OR_DEPARTMENT_OTHER): Payer: Self-pay | Admitting: Obstetrics & Gynecology

## 2023-02-12 DIAGNOSIS — Z01812 Encounter for preprocedural laboratory examination: Secondary | ICD-10-CM

## 2023-02-15 ENCOUNTER — Ambulatory Visit: Payer: Commercial Managed Care - HMO | Attending: Dental General Practice | Admitting: Physical Therapy

## 2023-02-15 ENCOUNTER — Ambulatory Visit: Payer: Commercial Managed Care - HMO | Attending: Gastroenterology

## 2023-02-15 ENCOUNTER — Encounter: Payer: Self-pay | Admitting: Physical Therapy

## 2023-02-15 DIAGNOSIS — M62838 Other muscle spasm: Secondary | ICD-10-CM | POA: Diagnosis present

## 2023-02-15 DIAGNOSIS — M542 Cervicalgia: Secondary | ICD-10-CM | POA: Insufficient documentation

## 2023-02-15 DIAGNOSIS — R6884 Jaw pain: Secondary | ICD-10-CM | POA: Diagnosis not present

## 2023-02-15 DIAGNOSIS — R293 Abnormal posture: Secondary | ICD-10-CM

## 2023-02-15 DIAGNOSIS — R531 Weakness: Secondary | ICD-10-CM | POA: Insufficient documentation

## 2023-02-15 DIAGNOSIS — R279 Unspecified lack of coordination: Secondary | ICD-10-CM | POA: Diagnosis present

## 2023-02-15 DIAGNOSIS — M5459 Other low back pain: Secondary | ICD-10-CM | POA: Diagnosis present

## 2023-02-15 DIAGNOSIS — S0340XD Sprain of jaw, unspecified side, subsequent encounter: Secondary | ICD-10-CM | POA: Diagnosis present

## 2023-02-15 DIAGNOSIS — X58XXXD Exposure to other specified factors, subsequent encounter: Secondary | ICD-10-CM | POA: Insufficient documentation

## 2023-02-15 DIAGNOSIS — M6281 Muscle weakness (generalized): Secondary | ICD-10-CM

## 2023-02-15 NOTE — Therapy (Signed)
OUTPATIENT PHYSICAL THERAPY CERVICAL/TMJ  EVALUATION   Patient Name: [name] MRN: [id number] DOB:05/29/1970, 52 y.o., female Today's Date: 02/16/2023  END OF SESSION:  PT End of Session - 02/15/23 1235     Visit Number 1   tmj   Date for PT Re-Evaluation 05/10/23    Authorization Type [employer]    PT Start Time 1236    PT Stop Time 1315    PT Time Calculation (min) 39 min    Activity Tolerance Patient tolerated treatment well             Past Medical History:  Diagnosis Date   Abdominal pain, epigastric 11/20/2019   Asthma    as a child   Cervical dysplasia    Gastroesophageal reflux disease 11/20/2019   Genetic testing 02/01/2018   Genetic testing was performed in 2015. The [employer] panel was ordered. In total, 23 genes were analyzed as part of this panel: ATM, BARD1, BRCA1, BRCA2, BRIP1, CDH1, CHEK2, EPCAM, MLH1, MRE11A, MSH2, MSH6, MUTYH, NBN, NF1, PALB2, PMS2, PTEN, RAD50, RAD51C, RAD51D, STK11, and TP53. No pathogenic variants were detected. Genetic testing did detect a Variant of Unknown Significance (VUS) at the t   GERD (gastroesophageal reflux disease)    had during chemo   Headache 06/12/2013   History of chemotherapy    History of kidney stones    History of radiation therapy 11/24/13- 01/08/14   reconstructed right breast/chest wall 4500 cGy 25 sessions, electron beam boost 1440 cGy 8 sessions   Liver masses 12/06/2016   Major depressive disorder, in remission 03/12/2017   Malignant neoplasm of lower-inner quadrant of right breast of female, estrogen receptor positive 2012   Pneumonia    PONV (postoperative nausea and vomiting)    Recurrent cancer of right breast 12/22/2013   Thrombocytopenia 05/22/2013   Past Surgical History:  Procedure Laterality Date   ADENOIDECTOMY     BREAST LUMPECTOMY WITH NEEDLE LOCALIZATION Right 05/05/2013   Procedure: EXCISION RECURRENT CANCER RIGHT BREAST WITH NEEDLE LOCALOZATION;  Surgeon: Judith Mention, MD;  Location: [facility]  OR;  Service: General;  Laterality: Right;   BREAST RECONSTRUCTION  06/29/2011   Procedure: BREAST RECONSTRUCTION;  Surgeon: Judith Denis, DO;  Location: [facility] OR;  Service: Plastics;  Laterality: Bilateral;  Immediate Bilateral Breast Reconstruction with Bilateral Tissue Expanders and placement of  Alloderm    DILATION AND CURETTAGE OF UTERUS     fibroid tumor surgery     lazy eye     corrective surgery   MASTECTOMY W/ SENTINEL NODE BIOPSY  06/29/2011   Procedure: MASTECTOMY WITH SENTINEL LYMPH NODE BIOPSY;  Surgeon: Judith Mention, MD;  Location: [facility] OR;  Service: General;  Laterality: Right;  right skin spairing mstectomy and right sentinel lymph node biopsy   PORT-A-CATH REMOVAL  03/12/2012   Procedure: MINOR REMOVAL PORT-A-CATH;  Surgeon: Judith Mention, MD;  Location: [facility];  Service: General;  Laterality: N/A;  Upper left   PORTACATH PLACEMENT Left 05/05/2013   Procedure: INSERTION PORT-A-CATH;  Surgeon: Judith Mention, MD;  Location: [facility] OR;  Service: General;  Laterality: Left;   RHINOPLASTY     uterine cervical treatments  2002   for precancerous lesions   Patient Active Problem List   Diagnosis Date Noted   Constipation 05/25/2022   Other fatigue 05/25/2022   Dizziness 05/25/2022   Endometrial thickening on ultrasound 05/24/2022   Abnormal laboratory test result 04/25/2022   Snoring 07/25/2021   Excessive daytime sleepiness 07/25/2021  Routine general medical examination at a health care facility 07/08/2021   Positive ANA (antinuclear antibody) 03/18/2021   Left leg pain 03/18/2021   Arthralgia 03/11/2021   Back pain 02/26/2021   Neck pain 02/26/2021   Gastroesophageal reflux disease 11/20/2019   Genetic testing 02/01/2018   Major depressive disorder, in remission 03/12/2017   Liver masses 12/06/2016   Recurrent cancer of right breast 12/22/2013   Headache 06/12/2013   Thrombocytopenia 05/22/2013   Malignant neoplasm of lower-inner quadrant of  right breast of female, estrogen receptor positive 2012    PCP: Hillard Danker MD  REFERRING PROVIDER: Lesle [name] DDS  REFERRING DIAG: TMJ/TMD  THERAPY DIAG:  Jaw pain  Weakness  Neck pain Neck pain  Rationale for Evaluation and Treatment: Rehabilitation  ONSET DATE: chronic but worsening  SUBJECTIVE:                                                                                                                                                                                                         SUBJECTIVE STATEMENT: History of cervical DDD and foraminal narrowing, decreased lordosis;  worsening of jaw pain over the years; left > right jaw pain; hard to close mouth; headaches into the temples (wakes up with them);  after being open a long time will lock;  reports the tissue in her face feels like it is drooping more  Goes by [name] or [name] PERTINENT HISTORY:  Currently pelvic floor with [name] CA (breast x 2) in remission, 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 Yes NPRS scale: 4/10,  6/10 sometimes Pain location: left > right  Pain orientation: Right and Left  PAIN TYPE: locking, popping Pain description: constant  Aggravating factors: chewing (tries to avoid hard food or big food), yawning Relieving factors: avoiding hard foods   PRECAUTIONS: None   WEIGHT BEARING RESTRICTIONS: No  FALLS:  Has patient fallen in last 6 months? No  PLOF: Independent  PATIENT GOALS: strengthen my jaw    OBJECTIVE:  Note: Objective measures were completed at Evaluation unless otherwise noted.   COGNITION: Overall cognitive status: Within functional limits for tasks assessed  POSTURE: forward head, overuse of scalenes and SCM  PALPATION: Prominent  clicking/crepitus with jaw opening left > right Spasm of masseter muscles left > right  Jaw ROM:  limited jaw opening 30mm; decreased  lateral movement 5mm right/left; subtle right  lateral deviation of mandible with closing  CERVICAL ROM:   Active ROM A/PROM (deg) eval  Flexion Full but feels tight upper neck  Extension 32  Right lateral flexion 36  Left lateral flexion 32 pain right upper trap  Right rotation 44  Left rotation 55   (Blank rows = not tested)  Upper cervical spine restriction Decreased deep cervical flexor strength   TODAY'S TREATMENT:                                                                                                                              DATE: 11/7  Eval and initial HEP PATIENT EDUCATION:  Education details: Educated patient on anatomy and physiology of current symptoms, prognosis, plan of care as well as initial self care strategies to promote recovery Briefly discussed deep breathing for relaxation, a mouthguard Person educated: Patient Education method: Explanation Education comprehension: verbalized understanding  HOME EXERCISE PROGRAM: Access Code: [email or web address] URL: [email or web address] Date: 02/15/2023 Prepared by: Lavinia Sharps  Exercises - Controlled Jaw Opening with Tongue on Roof of Mouth  - 5 x daily - 7 x weekly - 1 sets - 5 reps - Seated Cervical Retraction  - 1 x daily - 7 x weekly - 1 sets - 5 reps - Isometric Jaw Abduction  - 5 x daily - 7 x weekly - 1 sets - 5 reps  ASSESSMENT:  CLINICAL IMPRESSION: Patient is a 52 y.o. female who was seen today for physical therapy evaluation and treatment for TMJ disorder. She reports bilateral jaw and facial pain and popping aggravated by chewing hard foods and larger size food.  She had an episode of jaw locking after prolonged opening at the dentist.  Past history significant for neck pain and headaches.  Limited jaw opening as well as lateral deviation noted.   Tenderness and spasm in left > right masseter muscles.  Decreased cervical ROM in all planes particularly with upper cervical flexion.  Decreased deep cervical flexor strength and overuse of anterior superficial neck muscles.  She would benefit from PT to address these impairments.   OBJECTIVE IMPAIRMENTS: decreased activity tolerance, decreased ROM, decreased strength, increased fascial restrictions, impaired perceived functional ability, and pain.   ACTIVITY LIMITATIONS:  eating  PARTICIPATION LIMITATIONS:  eating  PERSONAL FACTORS: Time since onset of injury/illness/exacerbation and 1 comorbidity: osteoporosis, chronic neck pain/headaches  are also affecting patient's functional outcome.   REHAB POTENTIAL: Good  CLINICAL DECISION MAKING: Stable/uncomplicated  EVALUATION COMPLEXITY: Low   GOALS: Goals reviewed with patient? Yes  SHORT TERM GOALS: Target date: 03/30/2023   The patient will demonstrate knowledge of basic self care strategies including posture alignment and exercises to promote healing  Baseline:  Goal status: INITIAL  2.  Improved jaw mobility with opening to 35 mm needed for eating  Baseline:  Goal status: INITIAL  3.  Patient will report a 25% improvement in pain with eating, yawning and with  general daily activities Baseline:  Goal status: INITIAL   LONG TERM GOALS: Target date: 05/11/2023   The patient will be independent in a safe self progression of a home exercise program to promote further recovery of function  Baseline:  Goal status: INITIAL  2.  Improved jaw mobility with opening to 40 mm needed for eating  Baseline:  Goal status: INITIAL  3.  Patient will report a 50% improvement in pain with eating, yawning and with general daily activities Baseline:  Goal status: INITIAL  4.  Improved lateral jaw mobility to 10mm needed for eating Baseline:  Goal status: INITIAL    PLAN:  PT FREQUENCY: 1x/week  PT DURATION: 12 weeks  PLANNED  INTERVENTIONS: 97164- PT Re-evaluation, 97110-Therapeutic exercises, 97530- Therapeutic activity, 97112- Neuromuscular re-education, 97535- Self Care, 95621- Manual therapy, 97014- Electrical stimulation (unattended), [id number]- Electrical stimulation (manual), [id number]- Ultrasound, 30865- Ionotophoresis 4mg /ml Dexamethasone, Patient/Family education, Taping, Dry Needling, Joint mobilization, Spinal mobilization, Cryotherapy, and Moist heat  PLAN FOR NEXT SESSION: review tongue resting position, deep breathing, jaw isometrics, cervical retractions,  add deep cervical flexor activation ex, DN masseters and lateral pterygoids, upper traps, suboccipitals   Lavinia Sharps, PT 02/16/23 7:02 AM Phone: [phone] Fax: [phone]

## 2023-02-15 NOTE — Therapy (Signed)
OUTPATIENT PHYSICAL THERAPY FEMALE PELVIC TREATMENT   Patient Name: ALYONA ROMACK MRN: [id number] DOB:01-03-1971, 52 y.o., female Today's Date: 02/15/2023  END OF SESSION:  PT End of Session - 02/15/23 1154     Visit Number 10    Date for PT Re-Evaluation 04/12/23    Authorization Type [employer]    PT Start Time 1153    PT Stop Time 1227    PT Time Calculation (min) 34 min    Activity Tolerance Patient tolerated treatment well    Behavior During Therapy [facility] for tasks assessed/performed                   Past Medical History:  Diagnosis Date   Abdominal pain, epigastric 11/20/2019   Asthma    as a child   Cervical dysplasia    Gastroesophageal reflux disease 11/20/2019   Genetic testing 02/01/2018   Genetic testing was performed in 2015. The [employer] panel was ordered. In total, 23 genes were analyzed as part of this panel: ATM, BARD1, BRCA1, BRCA2, BRIP1, CDH1, CHEK2, EPCAM, MLH1, MRE11A, MSH2, MSH6, MUTYH, NBN, NF1, PALB2, PMS2, PTEN, RAD50, RAD51C, RAD51D, STK11, and TP53. No pathogenic variants were detected. Genetic testing did detect a Variant of Unknown Significance (VUS) at the t   GERD (gastroesophageal reflux disease)    had during chemo   Headache 06/12/2013   History of chemotherapy    History of kidney stones    History of radiation therapy 11/24/13- 01/08/14   reconstructed right breast/chest wall 4500 cGy 25 sessions, electron beam boost 1440 cGy 8 sessions   Liver masses 12/06/2016   Major depressive disorder, in remission 03/12/2017   Malignant neoplasm of lower-inner quadrant of right breast of female, estrogen receptor positive 2012   Pneumonia    PONV (postoperative nausea and vomiting)    Recurrent cancer of right breast 12/22/2013   Thrombocytopenia 05/22/2013   Past Surgical History:  Procedure Laterality Date   ADENOIDECTOMY     BREAST LUMPECTOMY WITH NEEDLE LOCALIZATION Right 05/05/2013   Procedure: EXCISION RECURRENT CANCER RIGHT BREAST  WITH NEEDLE LOCALOZATION;  Surgeon: [name], MD;  Location: [facility] OR;  Service: General;  Laterality: Right;   BREAST RECONSTRUCTION  06/29/2011   Procedure: BREAST RECONSTRUCTION;  Surgeon: Wayland Denis, DO;  Location: [facility] OR;  Service: Plastics;  Laterality: Bilateral;  Immediate Bilateral Breast Reconstruction with Bilateral Tissue Expanders and placement of  Alloderm    DILATION AND CURETTAGE OF UTERUS     fibroid tumor surgery     lazy eye     corrective surgery   MASTECTOMY W/ SENTINEL NODE BIOPSY  06/29/2011   Procedure: MASTECTOMY WITH SENTINEL LYMPH NODE BIOPSY;  Surgeon: [name], MD;  Location: [facility] OR;  Service: General;  Laterality: Right;  right skin spairing mstectomy and right sentinel lymph node biopsy   PORT-A-CATH REMOVAL  03/12/2012   Procedure: MINOR REMOVAL PORT-A-CATH;  Surgeon: [name], MD;  Location: [facility];  Service: General;  Laterality: N/A;  Upper left   PORTACATH PLACEMENT Left 05/05/2013   Procedure: INSERTION PORT-A-CATH;  Surgeon: [name], MD;  Location: [facility] OR;  Service: General;  Laterality: Left;   RHINOPLASTY     uterine cervical treatments  2002   for precancerous lesions   Patient Active Problem List   Diagnosis Date Noted   Constipation 05/25/2022   Other fatigue 05/25/2022   Dizziness 05/25/2022   Endometrial thickening on ultrasound 05/24/2022   Abnormal  laboratory test result 04/25/2022   Snoring 07/25/2021   Excessive daytime sleepiness 07/25/2021   Routine general medical examination at a health care facility 07/08/2021   Positive ANA (antinuclear antibody) 03/18/2021   Left leg pain 03/18/2021   Arthralgia 03/11/2021   Back pain 02/26/2021   Neck pain 02/26/2021   Gastroesophageal reflux disease 11/20/2019   Genetic testing 02/01/2018   Major depressive disorder, in remission 03/12/2017   Liver masses 12/06/2016   Recurrent cancer of right breast 12/22/2013   Headache 06/12/2013    Thrombocytopenia 05/22/2013   Malignant neoplasm of lower-inner quadrant of right breast of female, estrogen receptor positive 2012    PCP: Myrlene Broker, MD  REFERRING PROVIDER: Napoleon Form, MD   REFERRING DIAG: M62.89 (ICD-10-CM) - Pelvic floor dysfunction  THERAPY DIAG:  Muscle weakness (generalized)  Other muscle spasm  Unspecified lack of coordination  Other low back pain  Abnormal posture  Rationale for Evaluation and Treatment: Rehabilitation  ONSET DATE: 1 year  SUBJECTIVE:                                                                                                                                                                                           SUBJECTIVE STATEMENT: Pt states that she is was very sore after last treatment session. She has been taking magnesium on a regular basis and feels like it helps her have a consistent bowel movement most of the time. She feels like low back pain is generally better.   Fluid intake: Yes: drinks a lot of water    PAIN:  Are you having pain? Yes NPRS scale: 3/10 Pain location:  low back pain  Pain type: aching Pain description: intermittent and constant   Aggravating factors: prolonged sitting, constipation Relieving factors: movement  PRECAUTIONS: None  WEIGHT BEARING RESTRICTIONS: No  FALLS:  Has patient fallen in last 6 months? 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, bil mastectomy, osteopenia, fibroid removal in 2004   Sexual abuse: No  BOWEL MOVEMENT: Pain with bowel movement: No, but have been in the past, sometimes will throw up during bowel movement Type of bowel movement:Frequency every 1-2 weeks, Strain No, and Splinting no Fully empty  rectum: No Leakage: No Pads: No Fiber supplement: No, but tries to eat fiber rich foods; she does over the counter stool softeners, sometimes Miralax Feels like she does not get appropriate urgency to have a  bowel movement  URINATION: Pain with urination: No Fully empty bladder: Yes: - Stream:  start and stop Urgency: Yes: but no leaking Frequency: will vary on the day, bad days every 1-2 hours, on a good day every 2-3 hours Leakage:  none Pads: No  INTERCOURSE: Pain with intercourse: Initial Penetration and During Penetration Ability to have vaginal penetration:  Yes: yes, pain and bleeding at posterior fourchette  Climax: sometimes    PREGNANCY: Vaginal deliveries 1 Tearing Yes: episiotomy   OBJECTIVE:  01/18/23:   09/21/22: PALPATION:   General  Abdominal tenderness                External Perineal Exam WNL                             Internal Pelvic Floor WNL  Patient confirms identification and approves PT to assess internal pelvic floor and treatment Yes   PELVIC MMT:   MMT eval  Vaginal 3/5, 11 sec hold, 6 repeat  Internal Anal Sphincter 2/5  External Anal Sphincter 2/5  Puborectalis 2/5  Diastasis Recti WNL  (Blank rows = not tested)        TONE: WNL  PROLAPSE: Gr 1 anterior vaginal wall laxity, gr 1 posterior vaginal wall laxity 08/03/22: DIAGNOSTIC FINDINGS:  MRI, [place], CT scans all of abdomen and pelvis with demonstrating constipation  COGNITION: Overall cognitive status: Within functional limits for tasks assessed     SENSATION: Light touch: Appears intact Proprioception: Appears intact  GAIT: Comments: WNL  POSTURE:  Rt facing sacral location   LUMBARAROM/PROM:  A/PROM A/PROM  Eval (% available)  Flexion 100  Extension 100  Right lateral flexion 100  Left lateral flexion 100  Right rotation 100  Left rotation 100   (Blank rows = not tested)      TODAY'S TREATMENT:                                                                                                                               DATE:  02/15/23 Neuromuscular re-education: Kneeling chop 2 x 10 bil 5lbs Kneeling shoulder flexion 3 x 10 5lbs  Single leg dead lift 10x bil 10lbs Exercises: 3-way kick red band 2 x 10 each, bil Sidestepping red band 3 long laps Therapeutic activities: Lunges on BOSU 10x bil Lateral lunges on BOSU 10x bil Unilateral row with rotation 7 lbs 10x bil     01/24/26 Neuromuscular re-education: Kneeling chop 2 x 10 bil 5lbs Kneeling shoulder flexion 2 x 10 5lbs  Exercises: 3-way kick 2 x 10 each, bil Therapeutic activities: Lunges on BOSU 10x bil Lateral lunges on BOSU 10x bil Unilateral row 10x bil    01/18/23 RE-EVAL Neuromuscular re-education: Seated hip adduction/ball squeeze 2 x 10 with core/pelvic floor muscle contraction Seated march with anterior weight hold 4 lbs 3 x 10 Seated chop 10x bil 4lb  Exercises: Sidestepping red  band 3 long laps  3-way kick 10x each bil   PATIENT EDUCATION:  Education details: See above Person educated: Patient Education method: Explanation, Demonstration, Tactile cues, Verbal cues, and Handouts Education comprehension: verbalized understanding  HOME EXERCISE PROGRAM: [email or web address]  ASSESSMENT:  CLINICAL IMPRESSION: Pt has been sick and unable to work on Hep regularly, but has done some. Due to this and being very sore after last session, we spent today reviewing previous progressions. She did very well with all exercises today. We were able to add single leg dead lifts with good balance control. Believe that exercises are helping with low back pain as she has been able to be a little more consistent and possibly helping with constipation as well. She will continue to benefit from skilled PT intervention in order to improve frequency and ease of bowel movements, decrease low back pain, implement functional strengthening program for core and pelvic floor with appropriate  pressure management, and decrease dyspareunia.   OBJECTIVE IMPAIRMENTS: decreased activity tolerance, decreased coordination, decreased endurance, decreased strength, increased fascial restrictions, increased muscle spasms, impaired tone, postural dysfunction, and pain.   ACTIVITY LIMITATIONS:  bowel movements   PARTICIPATION LIMITATIONS:  functional activities  PERSONAL FACTORS: 1 comorbidity: medical history  are also affecting patient's functional outcome.   REHAB POTENTIAL: Good  CLINICAL DECISION MAKING: Stable/uncomplicated  EVALUATION COMPLEXITY: Low   GOALS: Goals reviewed with patient? Yes  SHORT TERM GOALS: Target date: 09/14/22 - updated 09/21/22 - 01/10/2023 - updated 01/18/23  Pt will be independent with HEP.   Baseline: Goal status: MET 01/10/23  2.  Pt will be independent with use of squatty potty, relaxed toileting mechanics, and improved bowel movement techniques in order to increase ease of bowel movements and complete evacuation.   Baseline:  Goal status:Met 01/10/23  3.  Pt will be independent with diaphragmatic breathing and down training activities in order to improve pelvic floor relaxation.  Baseline:  Goal status: MET 01/10/23    LONG TERM GOALS: Target date: 01/18/2023 - updated 09/21/22 - updated 01/10/2023 - updated 01/18/23  Pt will be independent with advanced HEP.   Baseline:  Goal status:IN PROGRESS  2.  Pt will increase frequency of bowel movements to 3-4x/week in order to improve comfort.  Baseline: 1-2 this last week (was doing better before trying to eat prunes vs drink prune juice) 01/18/23 Goal status: IN PROGRESS  3.  Pt will demonstrate normal pelvic floor muscle tone and A/ROM, able to achieve 4/5 strength with contractions and 10 sec endurance, in order to provide appropriate lumbopelvic support in functional activities.   Baseline: not formally assessed today 01/18/23 Goal status:IN PROGRESS  4.  Pt will report low back pain no  higher than 2/10. Baseline: currently 4/10, has gotten up to 7/10 in the last week 01/18/23 Goal status: IN PROGRESS   PLAN:  PT FREQUENCY: 1-2x/week  PT DURATION: 12 weeks  PLANNED INTERVENTIONS: Therapeutic exercises, Therapeutic activity, Neuromuscular re-education, Balance training, Gait training, Patient/Family education, Self Care, Joint mobilization, Dry Needling, Biofeedback, and Manual therapy  PLAN FOR NEXT SESSION: Continue to address thoracolumbar restriction; myofascial stretch release; progress core strengthening     Julio Alm, PT, DPT11/10/2410:30 PM

## 2023-02-19 ENCOUNTER — Ambulatory Visit: Payer: Commercial Managed Care - HMO

## 2023-02-19 DIAGNOSIS — R279 Unspecified lack of coordination: Secondary | ICD-10-CM

## 2023-02-19 DIAGNOSIS — M62838 Other muscle spasm: Secondary | ICD-10-CM

## 2023-02-19 DIAGNOSIS — M6281 Muscle weakness (generalized): Secondary | ICD-10-CM | POA: Diagnosis not present

## 2023-02-19 DIAGNOSIS — M5459 Other low back pain: Secondary | ICD-10-CM

## 2023-02-19 DIAGNOSIS — R531 Weakness: Secondary | ICD-10-CM

## 2023-02-19 NOTE — Therapy (Signed)
OUTPATIENT PHYSICAL THERAPY FEMALE PELVIC TREATMENT   Patient Name: [name] MRN: [id number] DOB:10-Feb-1971, 52 y.o., female Today's Date: 02/19/2023  END OF SESSION:  PT End of Session - 02/19/23 1621     Visit Number 12   11 for PF   Date for PT Re-Evaluation 04/12/23    Authorization Type [employer]    PT Start Time 1616    PT Stop Time 1655    PT Time Calculation (min) 39 min    Activity Tolerance Patient tolerated treatment well    Behavior During Therapy [facility] for tasks assessed/performed                   Past Medical History:  Diagnosis Date   Abdominal pain, epigastric 11/20/2019   Asthma    as a child   Cervical dysplasia    Gastroesophageal reflux disease 11/20/2019   Genetic testing 02/01/2018   Genetic testing was performed in 2015. The [employer] panel was ordered. In total, 23 genes were analyzed as part of this panel: ATM, BARD1, BRCA1, BRCA2, BRIP1, CDH1, CHEK2, EPCAM, MLH1, MRE11A, MSH2, MSH6, MUTYH, NBN, NF1, PALB2, PMS2, PTEN, RAD50, RAD51C, RAD51D, STK11, and TP53. No pathogenic variants were detected. Genetic testing did detect a Variant of Unknown Significance (VUS) at the t   GERD (gastroesophageal reflux disease)    had during chemo   Headache 06/12/2013   History of chemotherapy    History of kidney stones    History of radiation therapy 11/24/13- 01/08/14   reconstructed right breast/chest wall 4500 cGy 25 sessions, electron beam boost 1440 cGy 8 sessions   Liver masses 12/06/2016   Major depressive disorder, in remission 03/12/2017   Malignant neoplasm of lower-inner quadrant of right breast of female, estrogen receptor positive 2012   Pneumonia    PONV (postoperative nausea and vomiting)    Recurrent cancer of right breast 12/22/2013   Thrombocytopenia 05/22/2013   Past Surgical History:  Procedure Laterality Date   ADENOIDECTOMY     BREAST LUMPECTOMY WITH NEEDLE LOCALIZATION Right 05/05/2013   Procedure: EXCISION RECURRENT CANCER  RIGHT BREAST WITH NEEDLE LOCALOZATION;  Surgeon: [name], MD;  Location: [facility] OR;  Service: General;  Laterality: Right;   BREAST RECONSTRUCTION  06/29/2011   Procedure: BREAST RECONSTRUCTION;  Surgeon: Wayland Denis, DO;  Location: [facility] OR;  Service: Plastics;  Laterality: Bilateral;  Immediate Bilateral Breast Reconstruction with Bilateral Tissue Expanders and placement of  Alloderm    DILATION AND CURETTAGE OF UTERUS     fibroid tumor surgery     lazy eye     corrective surgery   MASTECTOMY W/ SENTINEL NODE BIOPSY  06/29/2011   Procedure: MASTECTOMY WITH SENTINEL LYMPH NODE BIOPSY;  Surgeon: [name], MD;  Location: [facility] OR;  Service: General;  Laterality: Right;  right skin spairing mstectomy and right sentinel lymph node biopsy   PORT-A-CATH REMOVAL  03/12/2012   Procedure: MINOR REMOVAL PORT-A-CATH;  Surgeon: [name], MD;  Location: [facility];  Service: General;  Laterality: N/A;  Upper left   PORTACATH PLACEMENT Left 05/05/2013   Procedure: INSERTION PORT-A-CATH;  Surgeon: [name], MD;  [facility];  Service: General;  Laterality: Left;   RHINOPLASTY     uterine cervical treatments  2002   for precancerous lesions   Patient Active Problem List   Diagnosis Date Noted   Constipation 05/25/2022   Other fatigue 05/25/2022   Dizziness 05/25/2022   Endometrial thickening on ultrasound  05/24/2022   Abnormal laboratory test result 04/25/2022   Snoring 07/25/2021   Excessive daytime sleepiness 07/25/2021   Routine general medical examination at a health care facility 07/08/2021   Positive ANA (antinuclear antibody) 03/18/2021   Left leg pain 03/18/2021   Arthralgia 03/11/2021   Back pain 02/26/2021   Neck pain 02/26/2021   Gastroesophageal reflux disease 11/20/2019   Genetic testing 02/01/2018   Major depressive disorder, in remission 03/12/2017   Liver masses 12/06/2016   Recurrent cancer of right breast 12/22/2013   Headache 06/12/2013    Thrombocytopenia 05/22/2013   Malignant neoplasm of lower-inner quadrant of right breast of female, estrogen receptor positive 2012    PCP: Myrlene Broker, MD  REFERRING PROVIDER: Napoleon Form, MD   REFERRING DIAG: M62.89 (ICD-10-CM) - Pelvic floor dysfunction  THERAPY DIAG:  Weakness  Muscle weakness (generalized)  Other muscle spasm  Unspecified lack of coordination  Other low back pain  Rationale for Evaluation and Treatment: Rehabilitation  ONSET DATE: 1 year  SUBJECTIVE:                                                                                                                                                                                           SUBJECTIVE STATEMENT: Pt states that her low back continues to feel better. She will notice little hints of pain, but overall better. Bowel movements have been more consistent with the magnesium and she thinks turmeric also is helpful. She has a headache today.   PAIN:  Are you having pain? Yes NPRS scale: 3/10 Pain location:  low back pain  Pain type: aching Pain description: intermittent and constant   Aggravating factors: prolonged sitting, constipation Relieving factors: movement  PRECAUTIONS: None  WEIGHT BEARING RESTRICTIONS: No  FALLS:  Has patient fallen in last 6 months? 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, bil mastectomy, osteopenia, fibroid removal in 2004   Sexual abuse: No  BOWEL MOVEMENT: Pain with bowel movement: No, but have been in the past, sometimes will throw up during bowel movement Type of bowel movement:Frequency every 1-2 weeks, Strain No, and Splinting no Fully empty rectum: No Leakage: No Pads: No Fiber supplement: No, but  tries to eat fiber rich foods; she does over the counter stool softeners, sometimes Miralax Feels like she does not get appropriate urgency to have a bowel movement  URINATION: Pain with urination: No Fully empty bladder: Yes: -  Stream:  start and stop Urgency: Yes: but no leaking Frequency: will vary on the day, bad days every 1-2 hours, on a good day every 2-3 hours Leakage:  none Pads: No  INTERCOURSE: Pain with intercourse: Initial Penetration and During Penetration Ability to have vaginal penetration:  Yes: yes, pain and bleeding at posterior fourchette  Climax: sometimes    PREGNANCY: Vaginal deliveries 1 Tearing Yes: episiotomy   OBJECTIVE:  01/18/23:   09/21/22: PALPATION:   General  Abdominal tenderness                External Perineal Exam WNL                             Internal Pelvic Floor WNL  Patient confirms identification and approves PT to assess internal pelvic floor and treatment Yes   PELVIC MMT:   MMT eval  Vaginal 3/5, 11 sec hold, 6 repeat  Internal Anal Sphincter 2/5  External Anal Sphincter 2/5  Puborectalis 2/5  Diastasis Recti WNL  (Blank rows = not tested)        TONE: WNL  PROLAPSE: Gr 1 anterior vaginal wall laxity, gr 1 posterior vaginal wall laxity 08/03/22: DIAGNOSTIC FINDINGS:  MRI, [place], CT scans all of abdomen and pelvis with demonstrating constipation  COGNITION: Overall cognitive status: Within functional limits for tasks assessed     SENSATION: Light touch: Appears intact Proprioception: Appears intact  GAIT: Comments: WNL  POSTURE:  Rt facing sacral location   LUMBARAROM/PROM:  A/PROM A/PROM  Eval (% available)  Flexion 100  Extension 100  Right lateral flexion 100  Left lateral flexion 100  Right rotation 100  Left rotation 100   (Blank rows = not tested)      TODAY'S TREATMENT:                                                                                                                               DATE:  02/19/23 Neuromuscular re-education: Supine pelvic tilts on dynadisc 2 x 10 Modified dead bug 10x ibl Full dead bug 2 x 10 Standing chop 8 lbs 10x bil Single leg dead lift 10x bil 10lbs Cross body deadlift with overhead press 10x bil 8lbs Therapeutic activities: Lunges on slider 10x bil Lateral lunges on slider 10x bil Unilateral row with rotation 7 lbs 10x bil    02/15/23 Neuromuscular re-education: Kneeling chop 2 x 10 bil 5lbs Kneeling shoulder flexion 3 x 10 5lbs  Single leg dead lift 10x bil 10lbs Exercises: 3-way kick red band 2 x 10 each, bil Sidestepping red band 3 long laps Therapeutic activities: Lunges on BOSU 10x bil Lateral lunges on BOSU 10x bil Unilateral row with rotation 7 lbs 10x bil     01/24/26 Neuromuscular re-education: Kneeling chop 2 x 10 bil 5lbs Kneeling shoulder flexion 2 x 10 5lbs  Exercises: 3-way kick 2 x 10 each, bil Therapeutic  activities: Lunges on BOSU 10x bil Lateral lunges on BOSU 10x bil Unilateral row 10x bil     PATIENT EDUCATION:  Education details: See above Person educated: Patient Education method: Explanation, Demonstration, Tactile cues, Verbal cues, and Handouts Education comprehension: verbalized understanding  HOME EXERCISE PROGRAM: [id number]  ASSESSMENT:  CLINICAL IMPRESSION: Pt overall doing well with bowel movements and decreased low back pain. She performed modified dead bug well today with good core control and appropriate challenge, but when she progressed to full dead bug she could not maintain neutral position of pelvis and had some increase in low back pain. She was encouraged to stick with modified position for time being. She was able to switch from using BOSU for lunges to furniture slider to challenge pelvic stability even more and did very well with control. She tolerated all other exercises very well with improving pace and core activation. She reported pain with no other exercise. She  will continue to benefit from skilled PT intervention in order to improve frequency and ease of bowel movements, decrease low back pain, implement functional strengthening program for core and pelvic floor with appropriate pressure management, and decrease dyspareunia.   OBJECTIVE IMPAIRMENTS: decreased activity tolerance, decreased coordination, decreased endurance, decreased strength, increased fascial restrictions, increased muscle spasms, impaired tone, postural dysfunction, and pain.   ACTIVITY LIMITATIONS:  bowel movements   PARTICIPATION LIMITATIONS:  functional activities  PERSONAL FACTORS: 1 comorbidity: medical history  are also affecting patient's functional outcome.   REHAB POTENTIAL: Good  CLINICAL DECISION MAKING: Stable/uncomplicated  EVALUATION COMPLEXITY: Low   GOALS: Goals reviewed with patient? Yes  SHORT TERM GOALS: Target date: 09/14/22 - updated 09/21/22 - 01/10/2023 - updated 01/18/23  Pt will be independent with HEP.   Baseline: Goal status: MET 01/10/23  2.  Pt will be independent with use of squatty potty, relaxed toileting mechanics, and improved bowel movement techniques in order to increase ease of bowel movements and complete evacuation.   Baseline:  Goal status:Met 01/10/23  3.  Pt will be independent with diaphragmatic breathing and down training activities in order to improve pelvic floor relaxation.  Baseline:  Goal status: MET 01/10/23    LONG TERM GOALS: Target date: 01/18/2023 - updated 09/21/22 - updated 01/10/2023 - updated 01/18/23 - updated 02/19/23  Pt will be independent with advanced HEP.   Baseline:  Goal status:IN PROGRESS  2.  Pt will increase frequency of bowel movements to 3-4x/week in order to improve comfort.  Baseline: She has had 2-3 in the last week, but has not had to use prune juice 02/19/23 Goal status: IN PROGRESS  3.  Pt will demonstrate normal pelvic floor muscle tone and A/ROM, able to achieve 4/5 strength with  contractions and 10 sec endurance, in order to provide appropriate lumbopelvic support in functional activities.   Baseline: not formally assessed today 01/18/23 Goal status:IN PROGRESS  4.  Pt will report low back pain no higher than 2/10. Baseline: currently 3/10 - reports overall improvement and lower pain levels 02/19/23 Goal status: IN PROGRESS   PLAN:  PT FREQUENCY: 1-2x/week  PT DURATION: 12 weeks  PLANNED INTERVENTIONS: Therapeutic exercises, Therapeutic activity, Neuromuscular re-education, Balance training, Gait training, Patient/Family education, Self Care, Joint mobilization, Dry Needling, Biofeedback, and Manual therapy  PLAN FOR NEXT SESSION: Continue to address thoracolumbar restriction; myofascial stretch release; progress core strengthening     Julio Alm, PT, DPT11/11/244:57 PM

## 2023-02-20 ENCOUNTER — Encounter (HOSPITAL_BASED_OUTPATIENT_CLINIC_OR_DEPARTMENT_OTHER): Payer: Self-pay | Admitting: Obstetrics & Gynecology

## 2023-02-22 ENCOUNTER — Encounter: Payer: Self-pay | Admitting: Internal Medicine

## 2023-02-22 ENCOUNTER — Ambulatory Visit: Payer: Managed Care, Other (non HMO) | Attending: Internal Medicine | Admitting: Internal Medicine

## 2023-02-22 ENCOUNTER — Telehealth: Payer: Self-pay | Admitting: Pharmacist

## 2023-02-22 VITALS — BP 94/67 | HR 69 | Resp 14 | Ht 63.0 in | Wt 125.0 lb

## 2023-02-22 DIAGNOSIS — K5900 Constipation, unspecified: Secondary | ICD-10-CM | POA: Diagnosis not present

## 2023-02-22 DIAGNOSIS — T386X5A Adverse effect of antigonadotrophins, antiestrogens, antiandrogens, not elsewhere classified, initial encounter: Secondary | ICD-10-CM

## 2023-02-22 DIAGNOSIS — R160 Hepatomegaly, not elsewhere classified: Secondary | ICD-10-CM | POA: Diagnosis not present

## 2023-02-22 DIAGNOSIS — R300 Dysuria: Secondary | ICD-10-CM

## 2023-02-22 DIAGNOSIS — K219 Gastro-esophageal reflux disease without esophagitis: Secondary | ICD-10-CM

## 2023-02-22 DIAGNOSIS — M2550 Pain in unspecified joint: Secondary | ICD-10-CM

## 2023-02-22 DIAGNOSIS — R899 Unspecified abnormal finding in specimens from other organs, systems and tissues: Secondary | ICD-10-CM

## 2023-02-22 DIAGNOSIS — R768 Other specified abnormal immunological findings in serum: Secondary | ICD-10-CM

## 2023-02-22 DIAGNOSIS — G8929 Other chronic pain: Secondary | ICD-10-CM

## 2023-02-22 DIAGNOSIS — M549 Dorsalgia, unspecified: Secondary | ICD-10-CM

## 2023-02-22 DIAGNOSIS — M818 Other osteoporosis without current pathological fracture: Secondary | ICD-10-CM | POA: Insufficient documentation

## 2023-02-22 NOTE — Patient Instructions (Signed)
Romosozumab Injection What is this medication? ROMOSOZUMAB (roe moe SOZ ue mab) prevents and treats osteoporosis. It works by Paramedic stronger and less likely to break (fracture). It is a monoclonal antibody. This medicine may be used for other purposes; ask your health care provider or pharmacist if you have questions. COMMON BRAND NAME(S): EVENITY What should I tell my care team before I take this medication? They need to know if you have any of these conditions: Dental disease Heart attack Heart disease Kidney problems Low levels of calcium in the blood On dialysis Stroke Wear dentures An unusual or allergic reaction to romosozumab, other medications, foods, dyes or preservatives Pregnant or trying to get pregnant Breast-feeding How should I use this medication? This medication is injected under the skin. It is given by your care team in a hospital or clinic setting. A special MedGuide will be given to you by the pharmacist with each prescription and refill. Be sure to read this information carefully each time. Talk to your care team about the use of this medication in children. Special care may be needed. Overdosage: If you think you have taken too much of this medicine contact a poison control center or emergency room at once. NOTE: This medicine is only for you. Do not share this medicine with others. What if I miss a dose? Keep appointments for follow-up doses. It is important not to miss your dose. Call your care team if you are unable to keep an appointment. What may interact with this medication? Interactions are not expected. This list may not describe all possible interactions. Give your health care provider a list of all the medicines, herbs, non-prescription drugs, or dietary supplements you use. Also tell them if you smoke, drink alcohol, or use illegal drugs. Some items may interact with your medicine. What should I watch for while using this medication? Your  condition will be monitored carefully while you are receiving this medication. You may need bloodwork while taking this medication. You should make sure you get enough calcium and vitamin D while you are taking this medication. Discuss the foods you eat and the vitamins you take with your care team. Some people who take this medication have severe bone, joint, or muscle pain. This medication may also increase your risk for jaw problems or a broken thigh bone. Tell your care team right away if you have severe pain in your jaw, bones, joints, or muscles. Tell you care team if you have any pain that does not go away or that gets worse. Tell your dentist and dental surgeon that you are taking this medication. You should not have major dental surgery while on this medication. See your dentist to have a dental exam and fix any dental problems before starting this medication. Take good care of your teeth while on this medication. Make sure you see your dentist for regular follow-up appointments. What side effects may I notice from receiving this medication? Side effects that you should report to your care team as soon as possible: Allergic reactions or angioedema--skin rash, itching or hives, swelling of the face, eyes, lips, tongue, arms, or legs, trouble swallowing or breathing Heart attack--pain or tightness in the chest, shoulders, arms, or jaw, nausea, shortness of breath, cold or clammy skin, feeling faint or lightheaded Low calcium level--muscle pain or cramps, confusion, tingling, or numbness in the hands or feet Osteonecrosis of the jaw--pain, swelling, or redness in the mouth, numbness of the jaw, poor healing after dental work,  unusual discharge from the mouth, visible bones in the mouth Severe bone, joint, or muscle pain Stroke--sudden numbness or weakness of the face, arm, or leg, trouble speaking, confusion, trouble walking, loss of balance or coordination, dizziness, severe headache, change in  vision Side effects that usually do not require medical attention (report to your care team if they continue or are bothersome): Headache Joint pain Muscle spasms Pain, redness, or irritation at injection site Swelling of the ankles, hands, or feet This list may not describe all possible side effects. Call your doctor for medical advice about side effects. You may report side effects to FDA at 1-800-FDA-1088. Where should I keep my medication? This medication is given in a hospital or clinic. It will not be stored at home. NOTE: This sheet is a summary. It may not cover all possible information. If you have questions about this medicine, talk to your doctor, pharmacist, or health care provider.  2024 Elsevier/Gold Standard (2021-04-27 00:00:00)

## 2023-02-22 NOTE — Telephone Encounter (Addendum)
Referral to [facility] will be placed once labs from today's OV result  Unfortunately there is no infusion center in [place] that can service  Chesley Mires, PharmD, MPH, BCPS, CPP Clinical Pharmacist (Rheumatology and Pulmonology)  ----- Message from Elmyra Ricks sent at 02/22/2023 12:35 PM EST ----- Regarding: [employer] EVENITY Patient consented. Thank you.

## 2023-02-24 LAB — COMPLETE METABOLIC PANEL WITH GFR
AG Ratio: 2.4 (calc) (ref 1.0–2.5)
ALT: 17 U/L (ref 6–29)
AST: 18 U/L (ref 10–35)
Albumin: 4.7 g/dL (ref 3.6–5.1)
Alkaline phosphatase (APISO): 89 U/L (ref 37–153)
BUN: 14 mg/dL (ref 7–25)
CO2: 31 mmol/L (ref 20–32)
Calcium: 10.1 mg/dL (ref 8.6–10.4)
Chloride: 102 mmol/L (ref 98–110)
Creat: 0.77 mg/dL (ref 0.50–1.03)
Globulin: 2 g/dL (ref 1.9–3.7)
Glucose, Bld: 87 mg/dL (ref 65–99)
Potassium: 4.7 mmol/L (ref 3.5–5.3)
Sodium: 140 mmol/L (ref 135–146)
Total Bilirubin: 0.6 mg/dL (ref 0.2–1.2)
Total Protein: 6.7 g/dL (ref 6.1–8.1)
eGFR: 92 mL/min/{1.73_m2} (ref >=60)

## 2023-02-24 LAB — PROTEIN ELECTROPHORESIS, SERUM
Albumin ELP: 4.5 g/dL (ref 3.8–4.8)
Alpha 1: 0.3 g/dL (ref 0.2–0.3)
Alpha 2: 0.6 g/dL (ref 0.5–0.9)
Beta 2: 0.3 g/dL (ref 0.2–0.5)
Beta Globulin: 0.4 g/dL (ref 0.4–0.6)
Gamma Globulin: 0.6 g/dL — ABNORMAL LOW (ref 0.8–1.7)
Total Protein: 6.7 g/dL (ref 6.1–8.1)

## 2023-02-24 LAB — TISSUE TRANSGLUTAMINASE, IGA: (tTG) Ab, IgA: <1 U/mL

## 2023-02-24 LAB — IGG, IGA, IGM
IgG (Immunoglobin G), Serum: 695 mg/dL (ref 600–1640)
IgM, Serum: 65 mg/dL (ref 50–300)
Immunoglobulin A: 47 mg/dL (ref 47–310)

## 2023-02-24 LAB — PTH, INTACT AND CALCIUM
Calcium: 10.1 mg/dL (ref 8.6–10.4)
PTH: 33 pg/mL (ref 16–77)

## 2023-02-24 LAB — ANTI-NUCLEAR AB-TITER (ANA TITER): ANA Titer 1: 1:40 {titer} — ABNORMAL HIGH

## 2023-02-24 LAB — SEDIMENTATION RATE: Sed Rate: 2 mm/h (ref 0–30)

## 2023-02-24 LAB — C3 AND C4
C3 Complement: 114 mg/dL (ref 83–193)
C4 Complement: 26 mg/dL (ref 15–57)

## 2023-02-24 LAB — ANA: Anti Nuclear Antibody (ANA): POSITIVE — AB

## 2023-02-28 ENCOUNTER — Ambulatory Visit: Payer: Commercial Managed Care - HMO

## 2023-02-28 DIAGNOSIS — M62838 Other muscle spasm: Secondary | ICD-10-CM

## 2023-02-28 DIAGNOSIS — M5459 Other low back pain: Secondary | ICD-10-CM

## 2023-02-28 DIAGNOSIS — M6281 Muscle weakness (generalized): Secondary | ICD-10-CM | POA: Diagnosis not present

## 2023-02-28 DIAGNOSIS — R279 Unspecified lack of coordination: Secondary | ICD-10-CM

## 2023-02-28 NOTE — Therapy (Signed)
OUTPATIENT PHYSICAL THERAPY FEMALE PELVIC TREATMENT   Patient Name: [name] MRN: [id number] DOB:01-10-1971, 52 y.o., female Today's Date: 02/28/2023  END OF SESSION:  PT End of Session - 02/28/23 1621     Visit Number 13    Date for PT Re-Evaluation 04/12/23    Authorization Type [employer]    PT Start Time 1616    PT Stop Time 1655    PT Time Calculation (min) 39 min    Activity Tolerance Patient tolerated treatment well    Behavior During Therapy [facility] for tasks assessed/performed                   Past Medical History:  Diagnosis Date   Abdominal pain, epigastric 11/20/2019   Asthma    as a child   Cervical dysplasia    Gastroesophageal reflux disease 11/20/2019   Genetic testing 02/01/2018   Genetic testing was performed in 2015. The [employer] panel was ordered. In total, 23 genes were analyzed as part of this panel: ATM, BARD1, BRCA1, BRCA2, BRIP1, CDH1, CHEK2, EPCAM, MLH1, MRE11A, MSH2, MSH6, MUTYH, NBN, NF1, PALB2, PMS2, PTEN, RAD50, RAD51C, RAD51D, STK11, and TP53. No pathogenic variants were detected. Genetic testing did detect a Variant of Unknown Significance (VUS) at the t   GERD (gastroesophageal reflux disease)    had during chemo   Headache 06/12/2013   History of chemotherapy    History of kidney stones    History of radiation therapy 11/24/13- 01/08/14   reconstructed right breast/chest wall 4500 cGy 25 sessions, electron beam boost 1440 cGy 8 sessions   Liver masses 12/06/2016   Major depressive disorder, in remission 03/12/2017   Malignant neoplasm of lower-inner quadrant of right breast of female, estrogen receptor positive 2012   Osteoporosis    Pneumonia    PONV (postoperative nausea and vomiting)    Recurrent cancer of right breast 12/22/2013   Thrombocytopenia 05/22/2013   Past Surgical History:  Procedure Laterality Date   ADENOIDECTOMY     BREAST LUMPECTOMY WITH NEEDLE LOCALIZATION Right 05/05/2013   Procedure: EXCISION  RECURRENT CANCER RIGHT BREAST WITH NEEDLE LOCALOZATION;  Surgeon: [name], MD;  Location: [facility] OR;  Service: General;  Laterality: Right;   BREAST RECONSTRUCTION  06/29/2011   Procedure: BREAST RECONSTRUCTION;  Surgeon: Wayland Denis, DO;  Location: [facility] OR;  Service: Plastics;  Laterality: Bilateral;  Immediate Bilateral Breast Reconstruction with Bilateral Tissue Expanders and placement of  Alloderm    DILATION AND CURETTAGE OF UTERUS     fibroid tumor surgery     lazy eye     corrective surgery   MASTECTOMY W/ SENTINEL NODE BIOPSY  06/29/2011   Procedure: MASTECTOMY WITH SENTINEL LYMPH NODE BIOPSY;  Surgeon: [name], MD;  Location: [facility] OR;  Service: General;  Laterality: Right;  right skin spairing mstectomy and right sentinel lymph node biopsy   PORT-A-CATH REMOVAL  03/12/2012   Procedure: MINOR REMOVAL PORT-A-CATH;  Surgeon: [name], MD;  Location: [facility];  Service: General;  Laterality: N/A;  Upper left   PORTACATH PLACEMENT Left 05/05/2013   Procedure: INSERTION PORT-A-CATH;  Surgeon: [name], MD;  Location: [facility] OR;  Service: General;  Laterality: Left;   RHINOPLASTY     uterine cervical treatments  2002   for precancerous lesions   Patient Active Problem List   Diagnosis Date Noted   Osteoporosis due to aromatase inhibitor 02/22/2023   Constipation 05/25/2022   Other fatigue 05/25/2022  Dizziness 05/25/2022   Endometrial thickening on ultrasound 05/24/2022   Abnormal laboratory test result 04/25/2022   Snoring 07/25/2021   Excessive daytime sleepiness 07/25/2021   Routine general medical examination at a health care facility 07/08/2021   Positive ANA (antinuclear antibody) 03/18/2021   Left leg pain 03/18/2021   Arthralgia 03/11/2021   Back pain 02/26/2021   Neck pain 02/26/2021   Gastroesophageal reflux disease 11/20/2019   Genetic testing 02/01/2018   Major depressive disorder, in remission 03/12/2017   Liver masses  12/06/2016   Recurrent cancer of right breast 12/22/2013   Headache 06/12/2013   Thrombocytopenia 05/22/2013   Malignant neoplasm of lower-inner quadrant of right breast of female, estrogen receptor positive 2012    PCP: Myrlene Broker, MD  REFERRING PROVIDER: Napoleon Form, MD   REFERRING DIAG: M62.89 (ICD-10-CM) - Pelvic floor dysfunction  THERAPY DIAG:  Muscle weakness (generalized)  Other muscle spasm  Unspecified lack of coordination  Other low back pain  Rationale for Evaluation and Treatment: Rehabilitation  ONSET DATE: 1 year  SUBJECTIVE:                                                                                                                                                                                           SUBJECTIVE STATEMENT: Pt states that she is starting to feel stronger. She is having some increased low back pain in the last 2 days. She is still struggling with constipation even doing Magnesium and prune juice. She feels like her back hurts more the longer it has been since she has had a bowel movement.   PAIN:  Are you having pain? Yes NPRS scale: 3/10 Pain location:  low back pain  Pain type: aching Pain description: intermittent and constant   Aggravating factors: prolonged sitting, constipation Relieving factors: movement  PRECAUTIONS: None  WEIGHT BEARING RESTRICTIONS: No  FALLS:  Has patient fallen in last 6 months? 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, bil mastectomy, osteopenia, fibroid removal in 2004   Sexual abuse: No  BOWEL MOVEMENT: Pain with bowel movement: No, but have been in the past, sometimes will throw up during bowel movement Type of bowel movement:Frequency  every 1-2 weeks, Strain No, and Splinting no Fully empty rectum: No Leakage: No Pads: No Fiber supplement: No, but tries to eat fiber rich foods; she does over the counter stool softeners, sometimes Miralax Feels like she does not  get appropriate urgency to have a bowel movement  URINATION: Pain with urination: No Fully empty bladder: Yes: - Stream:  start and stop Urgency: Yes: but no leaking Frequency: will vary on the day, bad days every 1-2 hours, on a good day every 2-3 hours Leakage:  none Pads: No  INTERCOURSE: Pain with intercourse: Initial Penetration and During Penetration Ability to have vaginal penetration:  Yes: yes, pain and bleeding at posterior fourchette  Climax: sometimes    PREGNANCY: Vaginal deliveries 1 Tearing Yes: episiotomy   OBJECTIVE:  01/18/23:   09/21/22: PALPATION:   General  Abdominal tenderness                External Perineal Exam WNL                             Internal Pelvic Floor WNL  Patient confirms identification and approves PT to assess internal pelvic floor and treatment Yes   PELVIC MMT:   MMT eval  Vaginal 3/5, 11 sec hold, 6 repeat  Internal Anal Sphincter 2/5  External Anal Sphincter 2/5  Puborectalis 2/5  Diastasis Recti WNL  (Blank rows = not tested)        TONE: WNL  PROLAPSE: Gr 1 anterior vaginal wall laxity, gr 1 posterior vaginal wall laxity 08/03/22: DIAGNOSTIC FINDINGS:  MRI, [place], CT scans all of abdomen and pelvis with demonstrating constipation  COGNITION: Overall cognitive status: Within functional limits for tasks assessed     SENSATION: Light touch: Appears intact Proprioception: Appears intact  GAIT: Comments: WNL  POSTURE:  Rt facing sacral location   LUMBARAROM/PROM:  A/PROM A/PROM  Eval (% available)  Flexion 100  Extension 100  Right lateral flexion 100  Left lateral flexion 100  Right rotation 100  Left rotation 100   (Blank rows = not tested)      TODAY'S  TREATMENT:                                                                                                                              DATE:  02/28/23 Manual: Trigger Point Dry-Needling  Treatment instructions: Expect mild to moderate muscle soreness. S/S of pneumothorax if dry needled over a lung field, and to seek immediate medical attention should they occur. Patient verbalized understanding of these instructions and education.  Patient Consent Given: Yes Education handout provided: Previously provided Muscles treated: T12-L1 and L3-L5 multifidi, bil glutes Electrical stimulation performed: No Parameters: N/A Treatment response/outcome: twitch response and release Soft tissue mobilization to lumbar paraspinals and bil glutes  Therapeutic activities: Education on appropriate activities to stimulate improved bone density with increased ground reaction forces   02/19/23 Neuromuscular re-education: Supine pelvic tilts on dynadisc 2 x 10 Modified dead bug 10x ibl Full dead bug 2 x 10 Standing chop 8 lbs 10x bil Single leg dead lift 10x bil 10lbs Cross body deadlift with overhead press 10x bil  8lbs Therapeutic activities: Lunges on slider 10x bil Lateral lunges on slider 10x bil Unilateral row with rotation 7 lbs 10x bil    02/15/23 Neuromuscular re-education: Kneeling chop 2 x 10 bil 5lbs Kneeling shoulder flexion 3 x 10 5lbs  Single leg dead lift 10x bil 10lbs Exercises: 3-way kick red band 2 x 10 each, bil Sidestepping red band 3 long laps Therapeutic activities: Lunges on BOSU 10x bil Lateral lunges on BOSU 10x bil Unilateral row with rotation 7 lbs 10x bil     PATIENT EDUCATION:  Education details: See above Person educated: Patient Education method: Programmer, multimedia, Demonstration, Tactile cues, Verbal cues, and Handouts Education comprehension: verbalized understanding  HOME EXERCISE PROGRAM: [id number]  ASSESSMENT:  CLINICAL IMPRESSION: Due to more low back  pain in the last several days even though pt has been very good about performing exercises on a regular basis, we decided to see how she did with dry needling and manual techniques this session after having taken a long break. We performed techniques surrounding thoracic/lumbar junction to help stimulate improved bowel movements. Lumbar multifidi dry needled to help improve motor control in the muscles of this area. Good tolerance to all techniques. We also discussed exercises that have the highest ground reaction forces to help target osteoporosis with work outs. She will continue to benefit from skilled PT intervention in order to improve frequency and ease of bowel movements, decrease low back pain, implement functional strengthening program for core and pelvic floor with appropriate pressure management, and decrease dyspareunia.   OBJECTIVE IMPAIRMENTS: decreased activity tolerance, decreased coordination, decreased endurance, decreased strength, increased fascial restrictions, increased muscle spasms, impaired tone, postural dysfunction, and pain.   ACTIVITY LIMITATIONS:  bowel movements   PARTICIPATION LIMITATIONS:  functional activities  PERSONAL FACTORS: 1 comorbidity: medical history  are also affecting patient's functional outcome.   REHAB POTENTIAL: Good  CLINICAL DECISION MAKING: Stable/uncomplicated  EVALUATION COMPLEXITY: Low   GOALS: Goals reviewed with patient? Yes  SHORT TERM GOALS: Target date: 09/14/22 - updated 09/21/22 - 01/10/2023 - updated 01/18/23  Pt will be independent with HEP.   Baseline: Goal status: MET 01/10/23  2.  Pt will be independent with use of squatty potty, relaxed toileting mechanics, and improved bowel movement techniques in order to increase ease of bowel movements and complete evacuation.   Baseline:  Goal status:Met 01/10/23  3.  Pt will be independent with diaphragmatic breathing and down training activities in order to improve pelvic floor  relaxation.  Baseline:  Goal status: MET 01/10/23    LONG TERM GOALS: Target date: 01/18/2023 - updated 09/21/22 - updated 01/10/2023 - updated 01/18/23 - updated 02/19/23  Pt will be independent with advanced HEP.   Baseline:  Goal status:IN PROGRESS  2.  Pt will increase frequency of bowel movements to 3-4x/week in order to improve comfort.  Baseline: She has had 2-3 in the last week, but has not had to use prune juice 02/19/23 Goal status: IN PROGRESS  3.  Pt will demonstrate normal pelvic floor muscle tone and A/ROM, able to achieve 4/5 strength with contractions and 10 sec endurance, in order to provide appropriate lumbopelvic support in functional activities.   Baseline: not formally assessed today 01/18/23 Goal status:IN PROGRESS  4.  Pt will report low back pain no higher than 2/10. Baseline: currently 3/10 - reports overall improvement and lower pain levels 02/19/23 Goal status: IN PROGRESS   PLAN:  PT FREQUENCY: 1-2x/week  PT DURATION: 12 weeks  PLANNED INTERVENTIONS: Therapeutic exercises, Therapeutic  activity, Neuromuscular re-education, Balance training, Gait training, Patient/Family education, Self Care, Joint mobilization, Dry Needling, Biofeedback, and Manual therapy  PLAN FOR NEXT SESSION: Continue to address thoracolumbar restriction; myofascial stretch release; progress core strengthening     Julio Alm, PT, DPT11/20/244:50 PM

## 2023-03-05 ENCOUNTER — Ambulatory Visit: Payer: Commercial Managed Care - HMO

## 2023-03-05 DIAGNOSIS — M5459 Other low back pain: Secondary | ICD-10-CM

## 2023-03-05 DIAGNOSIS — R279 Unspecified lack of coordination: Secondary | ICD-10-CM

## 2023-03-05 DIAGNOSIS — M6281 Muscle weakness (generalized): Secondary | ICD-10-CM | POA: Diagnosis not present

## 2023-03-05 DIAGNOSIS — R531 Weakness: Secondary | ICD-10-CM

## 2023-03-05 DIAGNOSIS — M62838 Other muscle spasm: Secondary | ICD-10-CM

## 2023-03-05 NOTE — Therapy (Signed)
OUTPATIENT PHYSICAL THERAPY FEMALE PELVIC TREATMENT   Patient Name: [name] MRN: [id number] DOB:11-19-1970, 52 y.o., female Today's Date: 03/05/2023  END OF SESSION:  PT End of Session - 03/05/23 1536     Visit Number 14    Date for PT Re-Evaluation 04/12/23    Authorization Type [employer]    PT Start Time 1535    PT Stop Time 1619    PT Time Calculation (min) 44 min    Activity Tolerance Patient tolerated treatment well    Behavior During Therapy [facility] for tasks assessed/performed                   Past Medical History:  Diagnosis Date   Abdominal pain, epigastric 11/20/2019   Asthma    as a child   Cervical dysplasia    Gastroesophageal reflux disease 11/20/2019   Genetic testing 02/01/2018   Genetic testing was performed in 2015. The [employer] panel was ordered. In total, 23 genes were analyzed as part of this panel: ATM, BARD1, BRCA1, BRCA2, BRIP1, CDH1, CHEK2, EPCAM, MLH1, MRE11A, MSH2, MSH6, MUTYH, NBN, NF1, PALB2, PMS2, PTEN, RAD50, RAD51C, RAD51D, STK11, and TP53. No pathogenic variants were detected. Genetic testing did detect a Variant of Unknown Significance (VUS) at the t   GERD (gastroesophageal reflux disease)    had during chemo   Headache 06/12/2013   History of chemotherapy    History of kidney stones    History of radiation therapy 11/24/13- 01/08/14   reconstructed right breast/chest wall 4500 cGy 25 sessions, electron beam boost 1440 cGy 8 sessions   Liver masses 12/06/2016   Major depressive disorder, in remission 03/12/2017   Malignant neoplasm of lower-inner quadrant of right breast of female, estrogen receptor positive 2012   Osteoporosis    Pneumonia    PONV (postoperative nausea and vomiting)    Recurrent cancer of right breast 12/22/2013   Thrombocytopenia 05/22/2013   Past Surgical History:  Procedure Laterality Date   ADENOIDECTOMY     BREAST LUMPECTOMY WITH NEEDLE LOCALIZATION Right 05/05/2013   Procedure: EXCISION  RECURRENT CANCER RIGHT BREAST WITH NEEDLE LOCALOZATION;  Surgeon: [name], MD;  Location: [facility] OR;  Service: General;  Laterality: Right;   BREAST RECONSTRUCTION  06/29/2011   Procedure: BREAST RECONSTRUCTION;  Surgeon: Wayland Denis, DO;  Location: [facility] OR;  Service: Plastics;  Laterality: Bilateral;  Immediate Bilateral Breast Reconstruction with Bilateral Tissue Expanders and placement of  Alloderm    DILATION AND CURETTAGE OF UTERUS     fibroid tumor surgery     lazy eye     corrective surgery   MASTECTOMY W/ SENTINEL NODE BIOPSY  06/29/2011   Procedure: MASTECTOMY WITH SENTINEL LYMPH NODE BIOPSY;  Surgeon: [name], MD;  Location: [facility] OR;  Service: General;  Laterality: Right;  right skin spairing mstectomy and right sentinel lymph node biopsy   PORT-A-CATH REMOVAL  03/12/2012   Procedure: MINOR REMOVAL PORT-A-CATH;  Surgeon: [name], MD;  Location: [facility];  Service: General;  Laterality: N/A;  Upper left   PORTACATH PLACEMENT Left 05/05/2013   Procedure: INSERTION PORT-A-CATH;  Surgeon: [name], MD;  Location[facility] OR;  Service: General;  Laterality: Left;   RHINOPLASTY     uterine cervical treatments  2002   for precancerous lesions   Patient Active Problem List   Diagnosis Date Noted   Osteoporosis due to aromatase inhibitor 02/22/2023   Constipation 05/25/2022   Other fatigue 05/25/2022  Dizziness 05/25/2022   Endometrial thickening on ultrasound 05/24/2022   Abnormal laboratory test result 04/25/2022   Snoring 07/25/2021   Excessive daytime sleepiness 07/25/2021   Routine general medical examination at a health care facility 07/08/2021   Positive ANA (antinuclear antibody) 03/18/2021   Left leg pain 03/18/2021   Arthralgia 03/11/2021   Back pain 02/26/2021   Neck pain 02/26/2021   Gastroesophageal reflux disease 11/20/2019   Genetic testing 02/01/2018   Major depressive disorder, in remission 03/12/2017   Liver masses  12/06/2016   Recurrent cancer of right breast 12/22/2013   Headache 06/12/2013   Thrombocytopenia 05/22/2013   Malignant neoplasm of lower-inner quadrant of right breast of female, estrogen receptor positive 2012    PCP: Myrlene Broker, MD  REFERRING PROVIDER: Napoleon Form, MD   REFERRING DIAG: M62.89 (ICD-10-CM) - Pelvic floor dysfunction  THERAPY DIAG:  Muscle weakness (generalized)  Other muscle spasm  Unspecified lack of coordination  Other low back pain  Weakness  Rationale for Evaluation and Treatment: Rehabilitation  ONSET DATE: 1 year  SUBJECTIVE:                                                                                                                                                                                           SUBJECTIVE STATEMENT: Pt states that she feels like dry needling may have helped with her bowel movements. However, she then ate fried food and states that she did not feel good afterwards. She states that she was on her feet a lot this morning and the longer she was the more achy she came across her low back.   PAIN:  Are you having pain? Yes NPRS scale: 3/10 Pain location:  low back pain  Pain type: aching Pain description: intermittent and constant   Aggravating factors: prolonged sitting, constipation Relieving factors: movement  PRECAUTIONS: None  WEIGHT BEARING RESTRICTIONS: No  FALLS:  Has patient fallen in last 6 months? 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, bil mastectomy, osteopenia, fibroid removal in 2004   Sexual abuse: No  BOWEL MOVEMENT: Pain with bowel movement: No, but have been in the past, sometimes will throw up during bowel movement Type of bowel  movement:Frequency every 1-2 weeks, Strain No, and Splinting no Fully empty rectum: No Leakage: No Pads: No Fiber supplement: No, but tries to eat fiber rich foods; she does over the counter stool softeners, sometimes Miralax  Feels like she does not get appropriate urgency to have a bowel movement  URINATION: Pain with urination: No Fully empty bladder: Yes: - Stream:  start and stop Urgency: Yes: but no leaking Frequency: will vary on the day, bad days every 1-2 hours, on a good day every 2-3 hours Leakage:  none Pads: No  INTERCOURSE: Pain with intercourse: Initial Penetration and During Penetration Ability to have vaginal penetration:  Yes: yes, pain and bleeding at posterior fourchette  Climax: sometimes    PREGNANCY: Vaginal deliveries 1 Tearing Yes: episiotomy   OBJECTIVE:  01/18/23:   09/21/22: PALPATION:   General  Abdominal tenderness                External Perineal Exam WNL                             Internal Pelvic Floor WNL  Patient confirms identification and approves PT to assess internal pelvic floor and treatment Yes   PELVIC MMT:   MMT eval  Vaginal 3/5, 11 sec hold, 6 repeat  Internal Anal Sphincter 2/5  External Anal Sphincter 2/5  Puborectalis 2/5  Diastasis Recti WNL  (Blank rows = not tested)        TONE: WNL  PROLAPSE: Gr 1 anterior vaginal wall laxity, gr 1 posterior vaginal wall laxity 08/03/22: DIAGNOSTIC FINDINGS:  MRI, [place], CT scans all of abdomen and pelvis with demonstrating constipation  COGNITION: Overall cognitive status: Within functional limits for tasks assessed     SENSATION: Light touch: Appears intact Proprioception: Appears intact  GAIT: Comments: WNL  POSTURE:  Rt facing sacral location   LUMBARAROM/PROM:  A/PROM A/PROM  Eval (% available)  Flexion 100  Extension 100  Right lateral flexion 100  Left lateral flexion 100  Right rotation 100  Left rotation 100   (Blank rows = not  tested)      TODAY'S TREATMENT:                                                                                                                              DATE:  03/05/23 Manual: Trigger Point Dry-Needling  Treatment instructions: Expect mild to moderate muscle soreness. S/S of pneumothorax if dry needled over a lung field, and to seek immediate medical attention should they occur. Patient verbalized understanding of these instructions and education.  Patient Consent Given: Yes Education handout provided: Previously provided Muscles treated: bil thoracic/lumbar junction, L1-L5 bil multifidi Electrical stimulation performed: Yes Parameters:  30 Hz, intensity as tolerated  Treatment response/outcome: release of soft tissue restriction Soft tissue mobilization to bil lumbar paraspinals  Exercises: Seated lateral flexion over peanut 60 seconds bil Open books 10x bil Standing peanut roll out at table 10x  02/28/23 Manual: Trigger Point Dry-Needling  Treatment instructions: Expect mild to moderate muscle soreness. S/S of pneumothorax if dry needled over a lung field, and to seek immediate medical attention  should they occur. Patient verbalized understanding of these instructions and education.  Patient Consent Given: Yes Education handout provided: Previously provided Muscles treated: T12-L1 and L3-L5 multifidi, bil glutes Electrical stimulation performed: No Parameters: N/A Treatment response/outcome: twitch response and release Soft tissue mobilization to lumbar paraspinals and bil glutes  Therapeutic activities: Education on appropriate activities to stimulate improved bone density with increased ground reaction forces   02/19/23 Neuromuscular re-education: Supine pelvic tilts on dynadisc 2 x 10 Modified dead bug 10x ibl Full dead bug 2 x 10 Standing chop 8 lbs 10x bil Single leg dead lift 10x bil 10lbs Cross body deadlift with overhead press 10x bil 8lbs Therapeutic  activities: Lunges on slider 10x bil Lateral lunges on slider 10x bil Unilateral row with rotation 7 lbs 10x bil     PATIENT EDUCATION:  Education details: See above Person educated: Patient Education method: Programmer, multimedia, Demonstration, Tactile cues, Verbal cues, and Handouts Education comprehension: verbalized understanding  HOME EXERCISE PROGRAM: [id number]  ASSESSMENT:  CLINICAL IMPRESSION: Pt feels like dry needling to low back was helpful last session for constipation and pain; due to this, performed again this session, but with e-stim for improved release of myofascial restriction and longer lasting benefits from technique. She did well with soft tissue mobilization afterwards to these tissues in order to further improve mobility of tissue. She tolerated all mobility exercises very well that were specifically performed to help improve thoracic/lumbar junction mobility. She will continue to benefit from skilled PT intervention in order to improve frequency and ease of bowel movements, decrease low back pain, implement functional strengthening program for core and pelvic floor with appropriate pressure management, and decrease dyspareunia.   OBJECTIVE IMPAIRMENTS: decreased activity tolerance, decreased coordination, decreased endurance, decreased strength, increased fascial restrictions, increased muscle spasms, impaired tone, postural dysfunction, and pain.   ACTIVITY LIMITATIONS:  bowel movements   PARTICIPATION LIMITATIONS:  functional activities  PERSONAL FACTORS: 1 comorbidity: medical history  are also affecting patient's functional outcome.   REHAB POTENTIAL: Good  CLINICAL DECISION MAKING: Stable/uncomplicated  EVALUATION COMPLEXITY: Low   GOALS: Goals reviewed with patient? Yes  SHORT TERM GOALS: Target date: 09/14/22 - updated 09/21/22 - 01/10/2023 - updated 01/18/23  Pt will be independent with HEP.   Baseline: Goal status: MET 01/10/23  2.  Pt will be  independent with use of squatty potty, relaxed toileting mechanics, and improved bowel movement techniques in order to increase ease of bowel movements and complete evacuation.   Baseline:  Goal status:Met 01/10/23  3.  Pt will be independent with diaphragmatic breathing and down training activities in order to improve pelvic floor relaxation.  Baseline:  Goal status: MET 01/10/23    LONG TERM GOALS: Target date: 01/18/2023 - updated 09/21/22 - updated 01/10/2023 - updated 01/18/23 - updated 02/19/23  Pt will be independent with advanced HEP.   Baseline:  Goal status:IN PROGRESS  2.  Pt will increase frequency of bowel movements to 3-4x/week in order to improve comfort.  Baseline: She has had 2-3 in the last week, but has not had to use prune juice 02/19/23 Goal status: IN PROGRESS  3.  Pt will demonstrate normal pelvic floor muscle tone and A/ROM, able to achieve 4/5 strength with contractions and 10 sec endurance, in order to provide appropriate lumbopelvic support in functional activities.   Baseline: not formally assessed today 01/18/23 Goal status:IN PROGRESS  4.  Pt will report low back pain no higher than 2/10. Baseline: currently 3/10 - reports overall improvement and lower  pain levels 02/19/23 Goal status: IN PROGRESS   PLAN:  PT FREQUENCY: 1-2x/week  PT DURATION: 12 weeks  PLANNED INTERVENTIONS: Therapeutic exercises, Therapeutic activity, Neuromuscular re-education, Balance training, Gait training, Patient/Family education, Self Care, Joint mobilization, Dry Needling, Biofeedback, and Manual therapy  PLAN FOR NEXT SESSION: Continue to address thoracolumbar restriction; myofascial stretch release; progress core strengthening     Julio Alm, PT, DPT11/25/244:18 PM

## 2023-03-13 ENCOUNTER — Other Ambulatory Visit: Payer: Self-pay | Admitting: Pharmacist

## 2023-03-13 NOTE — Telephone Encounter (Signed)
Order for [employer] placed w [facility] [employer]

## 2023-03-13 NOTE — Progress Notes (Addendum)
Therapy plan placed for [place] [phone]) for [facility] to start benefits investigation  Diagnosis: Osteoporosis due to aromatase inhibitor  Provider: Dr. Sheliah Hatch  Dose: 210mg  SQ every month for 12 months  Last Clinic Visit: 02/22/23 Next Clinic Visit: 08/22/23  Pertinent Labs: CMP, PTH, SPEP on 02/22/23  wnl, Vitamin D and TSH on 10/09/22 wnl  Chesley Mires, PharmD, MPH, BCPS, CPP Clinical Pharmacist (Rheumatology and Pulmonology)

## 2023-03-14 ENCOUNTER — Ambulatory Visit: Payer: Commercial Managed Care - HMO | Attending: Gastroenterology

## 2023-03-14 ENCOUNTER — Ambulatory Visit: Payer: Commercial Managed Care - HMO | Attending: Dental General Practice | Admitting: Physical Therapy

## 2023-03-14 DIAGNOSIS — M6281 Muscle weakness (generalized): Secondary | ICD-10-CM | POA: Insufficient documentation

## 2023-03-14 DIAGNOSIS — R6884 Jaw pain: Secondary | ICD-10-CM | POA: Diagnosis present

## 2023-03-14 DIAGNOSIS — M542 Cervicalgia: Secondary | ICD-10-CM | POA: Diagnosis present

## 2023-03-14 DIAGNOSIS — M5459 Other low back pain: Secondary | ICD-10-CM | POA: Insufficient documentation

## 2023-03-14 DIAGNOSIS — R279 Unspecified lack of coordination: Secondary | ICD-10-CM | POA: Diagnosis present

## 2023-03-14 DIAGNOSIS — M62838 Other muscle spasm: Secondary | ICD-10-CM | POA: Diagnosis present

## 2023-03-14 DIAGNOSIS — R531 Weakness: Secondary | ICD-10-CM | POA: Diagnosis present

## 2023-03-14 NOTE — Patient Instructions (Signed)
LIFTMOR  study

## 2023-03-14 NOTE — Therapy (Addendum)
OUTPATIENT PHYSICAL THERAPY FEMALE PELVIC TREATMENT   Patient Name: [name] MRN: [id number] DOB:07-Feb-1971, 52 y.o., female Today's Date: 03/14/2023  END OF SESSION:  PT End of Session - 03/14/23 1538     Visit Number 15    Date for PT Re-Evaluation 04/12/23    Authorization Type [employer]    PT Start Time 1531    PT Stop Time 1611    PT Time Calculation (min) 40 min    Activity Tolerance Patient tolerated treatment well    Behavior During Therapy [facility] for tasks assessed/performed                   Past Medical History:  Diagnosis Date   Abdominal pain, epigastric 11/20/2019   Asthma    as a child   Cervical dysplasia    Gastroesophageal reflux disease 11/20/2019   Genetic testing 02/01/2018   Genetic testing was performed in 2015. The [employer] panel was ordered. In total, 23 genes were analyzed as part of this panel: ATM, BARD1, BRCA1, BRCA2, BRIP1, CDH1, CHEK2, EPCAM, MLH1, MRE11A, MSH2, MSH6, MUTYH, NBN, NF1, PALB2, PMS2, PTEN, RAD50, RAD51C, RAD51D, STK11, and TP53. No pathogenic variants were detected. Genetic testing did detect a Variant of Unknown Significance (VUS) at the t   GERD (gastroesophageal reflux disease)    had during chemo   Headache 06/12/2013   History of chemotherapy    History of kidney stones    History of radiation therapy 11/24/13- 01/08/14   reconstructed right breast/chest wall 4500 cGy 25 sessions, electron beam boost 1440 cGy 8 sessions   Liver masses 12/06/2016   Major depressive disorder, in remission 03/12/2017   Malignant neoplasm of lower-inner quadrant of right breast of female, estrogen receptor positive 2012   Osteoporosis    Pneumonia    PONV (postoperative nausea and vomiting)    Recurrent cancer of right breast 12/22/2013   Thrombocytopenia 05/22/2013   Past Surgical History:  Procedure Laterality Date   ADENOIDECTOMY     BREAST LUMPECTOMY WITH NEEDLE LOCALIZATION Right 05/05/2013   Procedure: EXCISION  RECURRENT CANCER RIGHT BREAST WITH NEEDLE LOCALOZATION;  Surgeon: [name], MD;  Location: [facility] OR;  Service: General;  Laterality: Right;   BREAST RECONSTRUCTION  06/29/2011   Procedure: BREAST RECONSTRUCTION;  Surgeon: Wayland Denis, DO;  Location: [facility] OR;  Service: Plastics;  Laterality: Bilateral;  Immediate Bilateral Breast Reconstruction with Bilateral Tissue Expanders and placement of  Alloderm    DILATION AND CURETTAGE OF UTERUS     fibroid tumor surgery     lazy eye     corrective surgery   MASTECTOMY W/ SENTINEL NODE BIOPSY  06/29/2011   Procedure: MASTECTOMY WITH SENTINEL LYMPH NODE BIOPSY;  Surgeon: [name], MD;  Location: [facility] OR;  Service: General;  Laterality: Right;  right skin spairing mstectomy and right sentinel lymph node biopsy   PORT-A-CATH REMOVAL  03/12/2012   Procedure: MINOR REMOVAL PORT-A-CATH;  Surgeon: [name], MD;  Location: [facility];  Service: General;  Laterality: N/A;  Upper left   PORTACATH PLACEMENT Left 05/05/2013   Procedure: INSERTION PORT-A-CATH;  Surgeon: [name], MD;  Location: [facility] OR;  Service: General;  Laterality: Left;   RHINOPLASTY     uterine cervical treatments  2002   for precancerous lesions   Patient Active Problem List   Diagnosis Date Noted   Osteoporosis due to aromatase inhibitor 02/22/2023   Constipation 05/25/2022   Other fatigue 05/25/2022  Dizziness 05/25/2022   Endometrial thickening on ultrasound 05/24/2022   Abnormal laboratory test result 04/25/2022   Snoring 07/25/2021   Excessive daytime sleepiness 07/25/2021   Routine general medical examination at a health care facility 07/08/2021   Positive ANA (antinuclear antibody) 03/18/2021   Left leg pain 03/18/2021   Arthralgia 03/11/2021   Back pain 02/26/2021   Neck pain 02/26/2021   Gastroesophageal reflux disease 11/20/2019   Genetic testing 02/01/2018   Major depressive disorder, in remission 03/12/2017   Liver masses  12/06/2016   Recurrent cancer of right breast 12/22/2013   Headache 06/12/2013   Thrombocytopenia 05/22/2013   Malignant neoplasm of lower-inner quadrant of right breast of female, estrogen receptor positive 2012    PCP: Myrlene Broker, MD  REFERRING PROVIDER: Napoleon Form, MD   REFERRING DIAG: M62.89 (ICD-10-CM) - Pelvic floor dysfunction  THERAPY DIAG:  Muscle weakness (generalized)  Other muscle spasm  Unspecified lack of coordination  Other low back pain  Weakness  Rationale for Evaluation and Treatment: Rehabilitation  ONSET DATE: 1 year  SUBJECTIVE:                                                                                                                                                                                           SUBJECTIVE STATEMENT: Pt feels like dry needling is definitely helping with bowel movements. She has had 2 really good bowel movements in the last week and 2 on Thanksgiving.   PAIN:  Are you having pain? Yes NPRS scale: 3/10 Pain location:  low back pain  Pain type: aching Pain description: intermittent and constant   Aggravating factors: prolonged sitting, constipation Relieving factors: movement  PRECAUTIONS: None  WEIGHT BEARING RESTRICTIONS: No  FALLS:  Has patient fallen in last 6 months? 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, bil mastectomy, osteopenia, fibroid removal in 2004   Sexual abuse: No  BOWEL MOVEMENT: Pain with bowel movement: No, but have been in the past, sometimes will throw up during bowel movement Type of bowel movement:Frequency every 1-2 weeks, Strain No, and Splinting no Fully empty rectum: No Leakage: No Pads: No Fiber supplement: No, but tries  to eat fiber rich foods; she does over the counter stool softeners, sometimes Miralax Feels like she does not get appropriate urgency to have a bowel movement  URINATION: Pain with urination: No Fully empty bladder: Yes: - Stream:  start and stop  Urgency: Yes: but no leaking Frequency: will vary on the day, bad days every 1-2 hours, on a good day every 2-3 hours Leakage:  none Pads: No  INTERCOURSE: Pain with intercourse: Initial Penetration and During Penetration Ability to have vaginal penetration:  Yes: yes, pain and bleeding at posterior fourchette  Climax: sometimes    PREGNANCY: Vaginal deliveries 1 Tearing Yes: episiotomy   OBJECTIVE:  01/18/23:   09/21/22: PALPATION:   General  Abdominal tenderness                External Perineal Exam WNL                             Internal Pelvic Floor WNL  Patient confirms identification and approves PT to assess internal pelvic floor and treatment Yes   PELVIC MMT:   MMT eval  Vaginal 3/5, 11 sec hold, 6 repeat  Internal Anal Sphincter 2/5  External Anal Sphincter 2/5  Puborectalis 2/5  Diastasis Recti WNL  (Blank rows = not tested)        TONE: WNL  PROLAPSE: Gr 1 anterior vaginal wall laxity, gr 1 posterior vaginal wall laxity 08/03/22: DIAGNOSTIC FINDINGS:  MRI, [place], CT scans all of abdomen and pelvis with demonstrating constipation  COGNITION: Overall cognitive status: Within functional limits for tasks assessed     SENSATION: Light touch: Appears intact Proprioception: Appears intact  GAIT: Comments: WNL  POSTURE:  Rt facing sacral location   LUMBARAROM/PROM:  A/PROM A/PROM  Eval (% available)  Flexion 100  Extension 100  Right lateral flexion 100  Left lateral flexion 100  Right rotation 100  Left rotation 100   (Blank rows = not tested)      TODAY'S TREATMENT:                                                                                                                               DATE:  03/14/23 Manual: Trigger Point Dry-Needling  Treatment instructions: Expect mild to moderate muscle soreness. S/S of pneumothorax if dry needled over a lung field, and to seek immediate medical attention should they occur. Patient verbalized understanding of these instructions and education.  Patient Consent Given: Yes Education handout provided: Previously provided Muscles treated: bil thoracic/lumbar junction, L1-L5 bil multifidi Electrical stimulation performed: Yes Parameters: 30 Hz, intensity as tolerated  Treatment response/outcome: release of soft tissue restriction Soft tissue mobilization to bil lumbar paraspinals   03/05/23 Manual: Trigger Point Dry-Needling  Treatment instructions: Expect mild to moderate muscle soreness. S/S of pneumothorax if dry needled over a lung field, and to seek immediate medical attention should they occur. Patient verbalized understanding of these instructions and education.  Patient Consent Given: Yes Education handout provided: Previously provided Muscles treated: bil thoracic/lumbar junction, L1-L5 bil multifidi Electrical stimulation performed: Yes Parameters:  30 Hz, intensity as tolerated  Treatment response/outcome: release of soft tissue restriction Soft tissue  mobilization to bil lumbar paraspinals  Exercises: Seated lateral flexion over peanut 60 seconds bil Open books 10x bil Standing peanut roll out at table 10x  02/28/23 Manual: Trigger Point Dry-Needling  Treatment instructions: Expect mild to moderate muscle soreness. S/S of pneumothorax if dry needled over a lung field, and to seek immediate medical attention should they occur. Patient verbalized understanding of these instructions and education.  Patient Consent Given: Yes Education handout provided: Previously provided Muscles treated: T12-L1 and L3-L5 multifidi, bil glutes Electrical stimulation performed: No Parameters: N/A Treatment response/outcome: twitch  response and release Soft tissue mobilization to lumbar paraspinals and bil glutes  Therapeutic activities: Education on appropriate activities to stimulate improved bone density with increased ground reaction forces     PATIENT EDUCATION:  Education details: See above Person educated: Patient Education method: Programmer, multimedia, Demonstration, Tactile cues, Verbal cues, and Handouts Education comprehension: verbalized understanding  HOME EXERCISE PROGRAM: [id number]  ASSESSMENT:  CLINICAL IMPRESSION: Pt feels like the e-stim with dry needling is helping to improve bowel movements since she had two really good ones in the last week and has not changed anything. Due to this we performed dry needling with e-stim again with soft tissue mobilization to lumbar paraspinals. We left needles on e-stim a little longer to see if this has greater benefit or not. Good improvement in discomfort in low back. She will continue to benefit from skilled PT intervention in order to improve frequency and ease of bowel movements, decrease low back pain, implement functional strengthening program for core and pelvic floor with appropriate pressure management, and decrease dyspareunia.   OBJECTIVE IMPAIRMENTS: decreased activity tolerance, decreased coordination, decreased endurance, decreased strength, increased fascial restrictions, increased muscle spasms, impaired tone, postural dysfunction, and pain.   ACTIVITY LIMITATIONS:  bowel movements   PARTICIPATION LIMITATIONS:  functional activities  PERSONAL FACTORS: 1 comorbidity: medical history  are also affecting patient's functional outcome.   REHAB POTENTIAL: Good  CLINICAL DECISION MAKING: Stable/uncomplicated  EVALUATION COMPLEXITY: Low   GOALS: Goals reviewed with patient? Yes  SHORT TERM GOALS: Target date: 09/14/22 - updated 09/21/22 - 01/10/2023 - updated 01/18/23   Pt will be independent with HEP.   Baseline: Goal status: MET 01/10/23  2.  Pt  will be independent with use of squatty potty, relaxed toileting mechanics, and improved bowel movement techniques in order to increase ease of bowel movements and complete evacuation.   Baseline:  Goal status:Met 01/10/23  3.  Pt will be independent with diaphragmatic breathing and down training activities in order to improve pelvic floor relaxation.  Baseline:  Goal status: MET 01/10/23    LONG TERM GOALS: Target date: 01/18/2023 - updated 09/21/22 - updated 01/10/2023 - updated 01/18/23 - updated 02/19/23  Pt will be independent with advanced HEP.   Baseline:  Goal status:IN PROGRESS  2.  Pt will increase frequency of bowel movements to 3-4x/week in order to improve comfort.  Baseline: She has had 2-3 in the last week, but has not had to use prune juice 02/19/23 Goal status: IN PROGRESS  3.  Pt will demonstrate normal pelvic floor muscle tone and A/ROM, able to achieve 4/5 strength with contractions and 10 sec endurance, in order to provide appropriate lumbopelvic support in functional activities.   Baseline: not formally assessed today 01/18/23 Goal status:IN PROGRESS  4.  Pt will report low back pain no higher than 2/10. Baseline: currently 3/10 - reports overall improvement and lower pain levels 02/19/23 Goal status: IN PROGRESS   PLAN:  PT  FREQUENCY: 1-2x/week  PT DURATION: 12 weeks  PLANNED INTERVENTIONS: Therapeutic exercises, Therapeutic activity, Neuromuscular re-education, Balance training, Gait training, Patient/Family education, Self Care, Joint mobilization, Dry Needling, Biofeedback, and Manual therapy  PLAN FOR NEXT SESSION: Continue to address thoracolumbar restriction; myofascial stretch release; progress core strengthening     Julio Alm, PT, DPT12/04/244:13 PM  PHYSICAL THERAPY DISCHARGE SUMMARY  Visits from Start of Care: 15  Current functional level related to goals / functional outcomes: Independent   Remaining deficits: See above    Education / Equipment: HEP   Patient agrees to discharge. Patient goals were partially met. Patient is being discharged due to not returning since the last visit.  Julio Alm, PT, DPT02/13/259:40 AM

## 2023-03-14 NOTE — Therapy (Addendum)
OUTPATIENT PHYSICAL THERAPY CERVICAL/TMJ PROGRESS NOTE   Patient Name: [name] MRN: [id number] DOB:May 14, 1970, 52 y.o., female Today's Date: 03/14/2023  END OF SESSION:  PT End of Session - 03/14/23 1405     Visit Number 2    Date for PT Re-Evaluation 04/12/23    Authorization Type [employer]    PT Start Time 1406    PT Stop Time 1444    PT Time Calculation (min) 38 min    Activity Tolerance Patient tolerated treatment well             Past Medical History:  Diagnosis Date   Abdominal pain, epigastric 11/20/2019   Asthma    as a child   Cervical dysplasia    Gastroesophageal reflux disease 11/20/2019   Genetic testing 02/01/2018   Genetic testing was performed in 2015. The [employer] panel was ordered. In total, 23 genes were analyzed as part of this panel: ATM, BARD1, BRCA1, BRCA2, BRIP1, CDH1, CHEK2, EPCAM, MLH1, MRE11A, MSH2, MSH6, MUTYH, NBN, NF1, PALB2, PMS2, PTEN, RAD50, RAD51C, RAD51D, STK11, and TP53. No pathogenic variants were detected. Genetic testing did detect a Variant of Unknown Significance (VUS) at the t   GERD (gastroesophageal reflux disease)    had during chemo   Headache 06/12/2013   History of chemotherapy    History of kidney stones    History of radiation therapy 11/24/13- 01/08/14   reconstructed right breast/chest wall 4500 cGy 25 sessions, electron beam boost 1440 cGy 8 sessions   Liver masses 12/06/2016   Major depressive disorder, in remission 03/12/2017   Malignant neoplasm of lower-inner quadrant of right breast of female, estrogen receptor positive 2012   Osteoporosis    Pneumonia    PONV (postoperative nausea and vomiting)    Recurrent cancer of right breast 12/22/2013   Thrombocytopenia 05/22/2013   Past Surgical History:  Procedure Laterality Date   ADENOIDECTOMY     BREAST LUMPECTOMY WITH NEEDLE LOCALIZATION Right 05/05/2013   Procedure: EXCISION RECURRENT CANCER RIGHT BREAST WITH NEEDLE LOCALOZATION;  Surgeon: [name], MD;  Location: [facility] OR;  Service: General;  Laterality: Right;   BREAST RECONSTRUCTION  06/29/2011   Procedure: BREAST RECONSTRUCTION;  Surgeon: Wayland Denis, DO;  Location: [facility] OR;  Service: Plastics;  Laterality: Bilateral;  Immediate Bilateral Breast Reconstruction with Bilateral Tissue Expanders and placement of  Alloderm    DILATION AND CURETTAGE OF UTERUS     fibroid tumor surgery     lazy eye     corrective surgery   MASTECTOMY W/ SENTINEL NODE BIOPSY  06/29/2011   Procedure: MASTECTOMY WITH SENTINEL LYMPH NODE BIOPSY;  Surgeon: [name], MD;  Location: [facility] OR;  Service: General;  Laterality: Right;  right skin spairing mstectomy and right sentinel lymph node biopsy   PORT-A-CATH REMOVAL  03/12/2012   Procedure: MINOR REMOVAL PORT-A-CATH;  Surgeon: [name], MD;  Location: [facility];  Service: General;  Laterality: N/A;  Upper left   PORTACATH PLACEMENT Left 05/05/2013   Procedure: INSERTION PORT-A-CATH;  Surgeon: [name], MD;  Location: [facility] OR;  Service: General;  Laterality: Left;   RHINOPLASTY     uterine cervical treatments  2002   for precancerous lesions   Patient Active Problem List   Diagnosis Date Noted   Osteoporosis due to aromatase inhibitor 02/22/2023   Constipation 05/25/2022   Other fatigue 05/25/2022   Dizziness 05/25/2022   Endometrial thickening on ultrasound 05/24/2022   Abnormal laboratory test result 04/25/2022  Snoring 07/25/2021   Excessive daytime sleepiness 07/25/2021   Routine general medical examination at a health care facility 07/08/2021   Positive ANA (antinuclear antibody) 03/18/2021   Left leg pain 03/18/2021   Arthralgia 03/11/2021   Back pain 02/26/2021   Neck pain 02/26/2021   Gastroesophageal reflux disease 11/20/2019   Genetic testing 02/01/2018   Major depressive disorder, in remission 03/12/2017   Liver masses 12/06/2016   Recurrent cancer of right breast 12/22/2013   Headache 06/12/2013    Thrombocytopenia 05/22/2013   Malignant neoplasm of lower-inner quadrant of right breast of female, estrogen receptor positive 2012    PCP: Hillard Danker MD  REFERRING PROVIDER: Lesle Reek DDS  REFERRING DIAG: TMJ/TMD  THERAPY DIAG:  Muscle weakness (generalized)  Jaw pain  Neck pain Neck pain  Rationale for Evaluation and Treatment: Rehabilitation  ONSET DATE: chronic but worsening  SUBJECTIVE:                                                                                                                                                                                                         SUBJECTIVE STATEMENT: I was doing my ex's and I don't if I woke it up but I yawned and it hurt a lot and thought I dislocated my jaw (shortly after 1st visit).  Some neck spasms/UT area. Recent diagnosis of osteoporosis.  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 Yes NPRS scale: 4/10,  6/10 sometimes Pain location: left > right  Pain orientation: Right and Left  PAIN TYPE: locking, popping Pain description: constant  Aggravating factors: chewing (tries to avoid hard food or big food), yawning Relieving factors: avoiding hard foods   PRECAUTIONS:  None   WEIGHT BEARING RESTRICTIONS: No  FALLS:  Has patient fallen  in last 6 months? No  PLOF: Independent  PATIENT GOALS: strengthen my jaw    OBJECTIVE:  Note: Objective measures were completed at Evaluation unless otherwise noted.   COGNITION: Overall cognitive status: Within functional limits for tasks assessed  POSTURE: forward head, overuse of scalenes and SCM  PALPATION: Prominent clicking/crepitus with jaw opening left > right Spasm of masseter muscles left > right  Jaw ROM:  limited jaw opening 30mm; decreased lateral movement 5mm right/left; subtle right  lateral deviation of mandible with closing  CERVICAL ROM:   Active ROM A/PROM (deg) eval  Flexion Full but feels tight upper neck  Extension 32  Right lateral flexion 36  Left lateral flexion 32 pain right upper trap  Right rotation 44  Left rotation 55   (Blank rows = not tested)  Upper cervical spine restriction Decreased deep cervical flexor strength   TODAY'S TREATMENT:                                                                                                                              DATE: 12/4 Discuss avoiding chewing gum, yawn suppression Review of bone density scan: spine has worst T score  Osteoporosis education on impact ex's (heel drops, stomping), resistance training (weighted vest, dumbbells, bands), info on the LIFTMOR study per pt instructions  Manual therapy: soft tissue mobilization to bil masseters, lateral pterygoid, bil upper traps Trigger Point Dry-Needling  Treatment instructions: Expect mild to moderate muscle soreness. S/S of pneumothorax if dry needled over a lung field, and to seek immediate medical attention should they occur. Patient verbalized understanding of these instructions and education.  Patient Consent Given: Yes Education handout provided: Yes Muscles treated: bil masseters, lateral pterygoid, bil upper traps (anterior approach) Electrical stimulation  performed: No Parameters: N/A Treatment response/outcome: dec tender point size and number  Heat 2 min to jaw and upper traps seated    PATIENT EDUCATION:  Education details: Educated patient on anatomy and physiology of current symptoms, prognosis, plan of care as well as initial self care strategies to promote recovery Briefly discussed deep breathing for relaxation, a mouthguard Person educated: Patient Education method: Explanation Education comprehension: verbalized understanding  HOME EXERCISE PROGRAM: Access Code: [email or web address] URL: [email or web address] Date: 02/15/2023 Prepared by: Lavinia Sharps  Exercises - Controlled Jaw Opening with Tongue on Roof of Mouth  - 5 x daily - 7 x weekly - 1 sets - 5 reps - Seated Cervical Retraction  - 1 x daily - 7 x weekly - 1 sets - 5 reps - Isometric Jaw Abduction  - 5 x daily - 7 x weekly - 1 sets - 5 reps  ASSESSMENT:  CLINICAL IMPRESSION: The patient had a positive initial response to DN with much improved soft tissue mobility and decreased size and number of tender points.  Therapist monitoring response and educating patient on what to expect following DN and how to optimize benefit with specific exercise.  Anticipate pain intensity and  mobility will continue to improve over the next few days. Patient receptive to osteoporosis education and may benefit from a referral from her PCP for further ex's and education.      OBJECTIVE IMPAIRMENTS: decreased activity tolerance, decreased ROM, decreased strength, increased fascial restrictions, impaired perceived functional ability, and pain.   ACTIVITY LIMITATIONS:  eating  PARTICIPATION LIMITATIONS:  eating  PERSONAL FACTORS: Time since onset of injury/illness/exacerbation and 1 comorbidity: osteoporosis, chronic neck pain/headaches  are also affecting patient's functional outcome.   REHAB POTENTIAL: Good  CLINICAL DECISION MAKING: Stable/uncomplicated  EVALUATION COMPLEXITY:  Low   GOALS: Goals reviewed with patient? Yes  SHORT TERM GOALS: Target date: 03/30/2023   The patient will demonstrate knowledge of basic self care strategies including posture alignment and exercises to promote healing  Baseline:  Goal status: INITIAL  2.  Improved jaw mobility with opening to 35 mm needed for eating  Baseline:  Goal status: INITIAL  3.  Patient will report a 25% improvement in pain with eating, yawning and with general daily activities Baseline:  Goal status: INITIAL   LONG TERM GOALS: Target date: 05/11/2023   The patient will be independent in a safe self progression of a home exercise program to promote further recovery of function  Baseline:  Goal status: INITIAL  2.  Improved jaw mobility with opening to 40 mm needed for eating  Baseline:  Goal status: INITIAL  3.  Patient will report a 50% improvement in pain with eating, yawning and with general daily activities Baseline:  Goal status: INITIAL  4.  Improved lateral jaw mobility to 10mm needed for eating Baseline:  Goal status: INITIAL    PLAN:  PT FREQUENCY: 1x/week  PT DURATION: 12 weeks  PLANNED INTERVENTIONS: 97164- PT Re-evaluation, 97110-Therapeutic exercises, 97530- Therapeutic activity, 97112- Neuromuscular re-education, 97535- Self Care, 16109- Manual therapy, 97014- Electrical stimulation (unattended), [id number]- Electrical stimulation (manual), 97035- Ultrasound, 09811- Ionotophoresis 4mg /ml Dexamethasone, Patient/Family education, Taping, Dry Needling, Joint mobilization, Spinal mobilization, Cryotherapy, and Moist heat  PLAN FOR NEXT SESSION: pt states she will return after Christmas;  review tongue resting position, deep breathing, jaw isometrics, cervical retractions,  add deep cervical flexor activation ex, assess response to DN masseters and lateral pterygoids, upper traps, add DN to suboccipitals  Lavinia Sharps, PT 03/14/23 4:47 PM Phone: [phone] Fax:  [phone]

## 2023-03-16 ENCOUNTER — Encounter (HOSPITAL_COMMUNITY): Payer: Self-pay | Admitting: Obstetrics & Gynecology

## 2023-03-16 NOTE — Progress Notes (Signed)
SDW call  Patient was given pre-op instructions over the phone. Patient verbalized understanding of instructions provided.     PCP - Dr. Hillard Danker Cardiologist - Dr. Jodelle Red Pulmonary:    PPM/ICD - denies Device Orders - na Rep Notified - na   Chest x-ray - na EKG -  na Stress Test - ECHO - 01/26/2021 Cardiac Cath -   Sleep Study/sleep apnea/CPAP: denies  Non-diabetic  Blood Thinner Instructions: denies Aspirin Instructions:denies   ERAS Protcol - Clears until 1030   COVID TEST- na    Anesthesia review: No   Patient denies shortness of breath, fever, cough and chest pain over the phone call  Your procedure is scheduled on Tuesday March 20, 2023  Report to [facility] Main Entrance "A" at  1100  A.M., then check in with the Admitting office.  Call this number if you have problems the morning of surgery:  [phone]   If you have any questions prior to your surgery date call [phone]: Open Monday-Friday 8am-4pm If you experience any cold or flu symptoms such as cough, fever, chills, shortness of breath, etc. between now and your scheduled surgery, please notify us at the above number    Remember:  Do not eat after midnight the night before your surgery  You may drink clear liquids until  1030   the morning of your surgery.   Clear liquids allowed are: Water, Non-Citrus Juices (without pulp), Carbonated Beverages, Clear Tea, Black Coffee ONLY (NO MILK, CREAM OR POWDERED CREAMER of any kind), and Gatorade   Take these medicines if needed the morning of surgery:  Lumify eye drops, Pataday eye drops  As of today, STOP taking any Aspirin (unless otherwise instructed by your surgeon) Aleve, Naproxen, Ibuprofen, Motrin, Advil, Goody's, BC's, all herbal medications, fish oil, and all vitamins.

## 2023-03-17 ENCOUNTER — Telehealth: Payer: Self-pay

## 2023-03-17 NOTE — Telephone Encounter (Signed)
Patient stated she has not heard from Pre-op at [facility]. Patient surgery is 03/20/23. I sent a message to Dr. Briscoe Deutscher.

## 2023-03-19 ENCOUNTER — Telehealth (HOSPITAL_BASED_OUTPATIENT_CLINIC_OR_DEPARTMENT_OTHER): Payer: Self-pay | Admitting: *Deleted

## 2023-03-19 NOTE — Telephone Encounter (Signed)
-----   Message from Jerene Bears sent at 03/19/2023  1:30 PM EST ----- Regarding: FW: Pre-op 03/20/23 Can you touch base with this pt today?    Thanks.  Rosalita Chessman ----- Message ----- From: Lorriane Shire, MD Sent: 03/19/2023  10:04 AM EST To: Jerene Bears, MD; Donne Hazel Subject: RE: Pre-op 03/20/23                            Hi,  Looks like this is Dr. Rondel Baton patient and I have cc'd her.  Candie Chroman ----- Message ----- From: Donne Hazel Sent: 03/17/2023   8:40 AM EST To: Lorriane Shire, MD Subject: Pre-op 03/20/23                                Dr. Briscoe Deutscher,  The patient called to let us know she has not heard from Pre-op. Her surgery is 03/20/23 at [facility] Main. I let her know she's still scheduled and usually Pre-op will leave a note if there unable to reach a patient.  Checked the notes, Pre-op spoke to her on 03/16/2023 4:38 PM  Thanks, Serita Sheller

## 2023-03-19 NOTE — Telephone Encounter (Signed)
LMOVM for pt to call back regarding pre-op

## 2023-03-20 ENCOUNTER — Other Ambulatory Visit (HOSPITAL_COMMUNITY): Payer: Self-pay

## 2023-03-20 ENCOUNTER — Ambulatory Visit (HOSPITAL_COMMUNITY): Payer: Commercial Managed Care - HMO | Admitting: Anesthesiology

## 2023-03-20 ENCOUNTER — Other Ambulatory Visit: Payer: Self-pay

## 2023-03-20 ENCOUNTER — Other Ambulatory Visit (HOSPITAL_BASED_OUTPATIENT_CLINIC_OR_DEPARTMENT_OTHER): Payer: Self-pay | Admitting: Obstetrics & Gynecology

## 2023-03-20 ENCOUNTER — Encounter (HOSPITAL_COMMUNITY): Payer: Self-pay | Admitting: Obstetrics & Gynecology

## 2023-03-20 ENCOUNTER — Ambulatory Visit (HOSPITAL_COMMUNITY)
Admission: RE | Admit: 2023-03-20 | Discharge: 2023-03-20 | Disposition: A | Discharge status: Home / Self Care | Payer: Commercial Managed Care - HMO | Attending: Obstetrics & Gynecology | Admitting: Obstetrics & Gynecology

## 2023-03-20 ENCOUNTER — Encounter (HOSPITAL_COMMUNITY)
Admission: RE | Disposition: A | Discharge status: Home / Self Care | Payer: Self-pay | Source: Home / Self Care | Attending: Obstetrics & Gynecology

## 2023-03-20 DIAGNOSIS — N840 Polyp of corpus uteri: Secondary | ICD-10-CM

## 2023-03-20 DIAGNOSIS — R9389 Abnormal findings on diagnostic imaging of other specified body structures: Secondary | ICD-10-CM | POA: Insufficient documentation

## 2023-03-20 DIAGNOSIS — R519 Headache, unspecified: Secondary | ICD-10-CM | POA: Insufficient documentation

## 2023-03-20 DIAGNOSIS — M810 Age-related osteoporosis without current pathological fracture: Secondary | ICD-10-CM | POA: Insufficient documentation

## 2023-03-20 DIAGNOSIS — F32A Depression, unspecified: Secondary | ICD-10-CM | POA: Insufficient documentation

## 2023-03-20 DIAGNOSIS — Z9221 Personal history of antineoplastic chemotherapy: Secondary | ICD-10-CM | POA: Diagnosis not present

## 2023-03-20 DIAGNOSIS — J45909 Unspecified asthma, uncomplicated: Secondary | ICD-10-CM | POA: Insufficient documentation

## 2023-03-20 DIAGNOSIS — Z853 Personal history of malignant neoplasm of breast: Secondary | ICD-10-CM | POA: Insufficient documentation

## 2023-03-20 DIAGNOSIS — Z87891 Personal history of nicotine dependence: Secondary | ICD-10-CM | POA: Diagnosis not present

## 2023-03-20 DIAGNOSIS — M791 Myalgia, unspecified site: Secondary | ICD-10-CM

## 2023-03-20 DIAGNOSIS — K219 Gastro-esophageal reflux disease without esophagitis: Secondary | ICD-10-CM | POA: Insufficient documentation

## 2023-03-20 DIAGNOSIS — Z01812 Encounter for preprocedural laboratory examination: Secondary | ICD-10-CM

## 2023-03-20 DIAGNOSIS — R935 Abnormal findings on diagnostic imaging of other abdominal regions, including retroperitoneum: Secondary | ICD-10-CM | POA: Diagnosis not present

## 2023-03-20 HISTORY — DX: Dislocation of jaw, unspecified side, initial encounter: S03.00XA

## 2023-03-20 HISTORY — DX: Personal history of urinary calculi: Z87.442

## 2023-03-20 HISTORY — PX: HYSTEROSCOPY WITH D & C: SHX1775

## 2023-03-20 LAB — COMPREHENSIVE METABOLIC PANEL
ALT: 17 U/L (ref 0–44)
AST: 23 U/L (ref 15–41)
Albumin: 4.6 g/dL (ref 3.5–5.0)
Alkaline Phosphatase: 75 U/L (ref 38–126)
Anion gap: 11 (ref 5–15)
BUN: 13 mg/dL (ref 6–20)
CO2: 23 mmol/L (ref 22–32)
Calcium: 9.9 mg/dL (ref 8.9–10.3)
Chloride: 104 mmol/L (ref 98–111)
Creatinine, Ser: 0.85 mg/dL (ref 0.44–1.00)
GFR, Estimated: >60 mL/min (ref >60)
Glucose, Bld: 75 mg/dL (ref 70–99)
Potassium: 3.6 mmol/L (ref 3.5–5.1)
Sodium: 138 mmol/L (ref 135–145)
Total Bilirubin: 1 mg/dL (ref <1.2)
Total Protein: 7.1 g/dL (ref 6.5–8.1)

## 2023-03-20 LAB — CBC
HCT: 42.1 % (ref 36.0–46.0)
Hemoglobin: 13.7 g/dL (ref 12.0–15.0)
MCH: 30.7 pg (ref 26.0–34.0)
MCHC: 32.5 g/dL (ref 30.0–36.0)
MCV: 94.4 fL (ref 80.0–100.0)
Platelets: 294 10*3/uL (ref 150–400)
RBC: 4.46 MIL/uL (ref 3.87–5.11)
RDW: 12.1 % (ref 11.5–15.5)
WBC: 6.2 10*3/uL (ref 4.0–10.5)
nRBC: 0 % (ref 0.0–0.2)

## 2023-03-20 SURGERY — DILATATION AND CURETTAGE /HYSTEROSCOPY
Anesthesia: General | Site: Vagina

## 2023-03-20 MED ORDER — SODIUM CHLORIDE 0.9 % IV SOLN: INTRAVENOUS | Status: DC

## 2023-03-20 MED ORDER — OXYCODONE HCL 5 MG/5ML PO SOLN: 5.0000 mg | Freq: Once | ORAL | Status: DC | PRN

## 2023-03-20 MED ORDER — OXYCODONE HCL 5 MG PO TABS: 5.0000 mg | ORAL_TABLET | Freq: Once | ORAL | Status: DC | PRN

## 2023-03-20 MED ORDER — PROPOFOL 10 MG/ML IV BOLUS
INTRAVENOUS | Status: AC
  Filled 2023-03-20: qty 20

## 2023-03-20 MED ORDER — MIDAZOLAM HCL 2 MG/2ML IJ SOLN
INTRAMUSCULAR | Status: DC | PRN
  Administered 2023-03-20: 2 mg via INTRAVENOUS

## 2023-03-20 MED ORDER — ORAL CARE MOUTH RINSE: 15.0000 mL | Freq: Once | OROMUCOSAL | Status: AC

## 2023-03-20 MED ORDER — SCOPOLAMINE 1 MG/3DAYS TD PT72
MEDICATED_PATCH | TRANSDERMAL | Status: AC
  Filled 2023-03-20: qty 1

## 2023-03-20 MED ORDER — DEXAMETHASONE SODIUM PHOSPHATE 10 MG/ML IJ SOLN
INTRAMUSCULAR | Status: AC
  Filled 2023-03-20: qty 1

## 2023-03-20 MED ORDER — LIDOCAINE 2% (20 MG/ML) 5 ML SYRINGE
INTRAMUSCULAR | Status: AC
  Filled 2023-03-20: qty 5

## 2023-03-20 MED ORDER — ACETAMINOPHEN 650 MG RE SUPP: 650.0000 mg | RECTAL | Status: DC | PRN

## 2023-03-20 MED ORDER — SODIUM CHLORIDE 0.9 % IR SOLN
Status: DC | PRN
  Administered 2023-03-20: 3000 mL

## 2023-03-20 MED ORDER — PROPOFOL 10 MG/ML IV BOLUS
INTRAVENOUS | Status: DC | PRN
  Administered 2023-03-20: 150 ug/kg/min via INTRAVENOUS
  Administered 2023-03-20: 120 mg via INTRAVENOUS

## 2023-03-20 MED ORDER — DROPERIDOL 2.5 MG/ML IJ SOLN: 0.6250 mg | Freq: Once | INTRAMUSCULAR | Status: DC | PRN

## 2023-03-20 MED ORDER — CHLORHEXIDINE GLUCONATE 0.12 % MT SOLN
OROMUCOSAL | Status: AC
  Administered 2023-03-20: 15 mL via OROMUCOSAL
  Filled 2023-03-20: qty 15

## 2023-03-20 MED ORDER — CHLORHEXIDINE GLUCONATE 0.12 % MT SOLN
15.0000 mL | Freq: Once | OROMUCOSAL | Status: AC
Start: 2023-03-20 — End: 2023-03-20

## 2023-03-20 MED ORDER — KETOROLAC TROMETHAMINE 30 MG/ML IJ SOLN: 30.0000 mg | Freq: Four times a day (QID) | INTRAMUSCULAR | Status: DC

## 2023-03-20 MED ORDER — HYDROCODONE-ACETAMINOPHEN 5-325 MG PO TABS: 1.0000 | ORAL_TABLET | Freq: Four times a day (QID) | ORAL | 0 refills | Status: DC | PRN

## 2023-03-20 MED ORDER — FENTANYL CITRATE (PF) 250 MCG/5ML IJ SOLN
INTRAMUSCULAR | Status: AC
  Filled 2023-03-20: qty 5

## 2023-03-20 MED ORDER — ONDANSETRON HCL 4 MG/2ML IJ SOLN: 4.0000 mg | Freq: Once | INTRAMUSCULAR | Status: DC | PRN

## 2023-03-20 MED ORDER — DEXAMETHASONE SODIUM PHOSPHATE 10 MG/ML IJ SOLN
INTRAMUSCULAR | Status: DC | PRN
  Administered 2023-03-20: 6 mg via INTRAVENOUS

## 2023-03-20 MED ORDER — FENTANYL CITRATE (PF) 100 MCG/2ML IJ SOLN: 25.0000 ug | INTRAMUSCULAR | Status: DC | PRN

## 2023-03-20 MED ORDER — SODIUM CHLORIDE 0.9 % IV SOLN: INTRAVENOUS | Status: DC | PRN

## 2023-03-20 MED ORDER — IBUPROFEN 800 MG PO TABS
800.0000 mg | ORAL_TABLET | Freq: Three times a day (TID) | ORAL | 0 refills | Status: DC | PRN
  Filled 2023-03-20: qty 20, 7d supply, fill #0

## 2023-03-20 MED ORDER — ONDANSETRON HCL 4 MG/2ML IJ SOLN
INTRAMUSCULAR | Status: AC
  Filled 2023-03-20: qty 2

## 2023-03-20 MED ORDER — KETOROLAC TROMETHAMINE 15 MG/ML IJ SOLN
INTRAMUSCULAR | Status: DC | PRN
  Administered 2023-03-20: 15 mg via INTRAVENOUS

## 2023-03-20 MED ORDER — FENTANYL CITRATE (PF) 250 MCG/5ML IJ SOLN
INTRAMUSCULAR | Status: DC | PRN
  Administered 2023-03-20: 50 ug via INTRAVENOUS

## 2023-03-20 MED ORDER — POVIDONE-IODINE 10 % EX SWAB
2.0000 | Freq: Once | CUTANEOUS | Status: AC
  Administered 2023-03-20: 2 via TOPICAL

## 2023-03-20 MED ORDER — HYDROCODONE-ACETAMINOPHEN 5-325 MG PO TABS
1.0000 | ORAL_TABLET | Freq: Four times a day (QID) | ORAL | 0 refills | Status: DC | PRN
  Filled 2023-03-20: qty 8, 2d supply, fill #0

## 2023-03-20 MED ORDER — LIDOCAINE-EPINEPHRINE 1 %-1:100000 IJ SOLN
INTRAMUSCULAR | Status: DC | PRN
  Administered 2023-03-20: 10 mL

## 2023-03-20 MED ORDER — PHENYLEPHRINE 80 MCG/ML (10ML) SYRINGE FOR IV PUSH (FOR BLOOD PRESSURE SUPPORT)
PREFILLED_SYRINGE | INTRAVENOUS | Status: DC | PRN
  Administered 2023-03-20: 160 ug via INTRAVENOUS
  Administered 2023-03-20: 120 ug via INTRAVENOUS

## 2023-03-20 MED ORDER — ONDANSETRON HCL 4 MG/2ML IJ SOLN
INTRAMUSCULAR | Status: DC | PRN
  Administered 2023-03-20: 4 mg via INTRAVENOUS

## 2023-03-20 MED ORDER — LIDOCAINE 2% (20 MG/ML) 5 ML SYRINGE
INTRAMUSCULAR | Status: DC | PRN
  Administered 2023-03-20: 60 mg via INTRAVENOUS

## 2023-03-20 MED ORDER — SCOPOLAMINE 1 MG/3DAYS TD PT72
MEDICATED_PATCH | TRANSDERMAL | Status: DC | PRN
  Administered 2023-03-20: 1 via TRANSDERMAL

## 2023-03-20 MED ORDER — ACETAMINOPHEN 325 MG PO TABS: 650.0000 mg | ORAL_TABLET | ORAL | Status: DC | PRN

## 2023-03-20 MED ORDER — LIDOCAINE-EPINEPHRINE 1 %-1:100000 IJ SOLN
INTRAMUSCULAR | Status: AC
  Filled 2023-03-20: qty 1

## 2023-03-20 MED ORDER — MIDAZOLAM HCL 2 MG/2ML IJ SOLN
INTRAMUSCULAR | Status: AC
  Filled 2023-03-20: qty 2

## 2023-03-20 SURGICAL SUPPLY — 17 items
CATH ROBINSON RED A/P 16FR (CATHETERS) ×1 IMPLANT
CNTNR URN SCR LID CUP LEK RST (MISCELLANEOUS) ×1 IMPLANT
DEVICE MYOSURE LITE (MISCELLANEOUS) IMPLANT
DEVICE MYOSURE REACH (MISCELLANEOUS) IMPLANT
DILATOR CANAL MILEX (MISCELLANEOUS) IMPLANT
GLOVE BIOGEL PI IND STRL 7.0 (GLOVE) ×2 IMPLANT
GLOVE ECLIPSE 7.0 STRL STRAW (GLOVE) ×1 IMPLANT
GOWN STRL REUS W/ TWL LRG LVL3 (GOWN DISPOSABLE) ×1 IMPLANT
GOWN STRL REUS W/ TWL XL LVL3 (GOWN DISPOSABLE) ×1 IMPLANT
KIT PROCEDURE FLUENT (KITS) ×1 IMPLANT
MYOSURE XL FIBROID (MISCELLANEOUS)
PACK VAGINAL MINOR WOMEN LF (CUSTOM PROCEDURE TRAY) ×1 IMPLANT
PAD OB MATERNITY 4.3X12.25 (PERSONAL CARE ITEMS) ×1 IMPLANT
SEAL ROD LENS SCOPE MYOSURE (ABLATOR) ×1 IMPLANT
SPIKE FLUID TRANSFER (MISCELLANEOUS) ×1 IMPLANT
SYSTEM TISS REMOVAL MYOSURE XL (MISCELLANEOUS) IMPLANT
TOWEL GREEN STERILE FF (TOWEL DISPOSABLE) ×1 IMPLANT

## 2023-03-20 NOTE — Anesthesia Preprocedure Evaluation (Addendum)
Anesthesia Evaluation  Patient identified by MRN, date of birth, ID band Patient awake    History of Anesthesia Complications (+) PONV and history of anesthetic complications  Airway Mallampati: II  TM Distance: >3 FB     Dental no notable dental hx. (+) Dental Advisory Given, Caps   Pulmonary asthma , pneumonia, resolved, former smoker   Pulmonary exam normal breath sounds clear to auscultation       Cardiovascular negative cardio ROS Normal cardiovascular exam Rhythm:Regular Rate:Normal     Neuro/Psych  Headaches PSYCHIATRIC DISORDERS  Depression       GI/Hepatic Neg liver ROS,GERD  Medicated,,  Endo/Other  Hx/o right breast Ca S/P mastectomy with reconstruction S/P chemoRx and RT  Renal/GU negative Renal ROS  negative genitourinary   Musculoskeletal Osteoporosis   Abdominal   Peds  Hematology negative hematology ROS (+)   Anesthesia Other Findings   Reproductive/Obstetrics Endometrial polyp                             Anesthesia Physical Anesthesia Plan  ASA: 2  Anesthesia Plan: General   Post-op Pain Management: Minimal or no pain anticipated, Tylenol PO (pre-op)* and Dilaudid IV   Induction: Intravenous  PONV Risk Score and Plan: 4 or greater and Treatment may vary due to age or medical condition, Ondansetron, TIVA and Dexamethasone  Airway Management Planned: LMA  Additional Equipment: None  Intra-op Plan:   Post-operative Plan: Extubation in OR  Informed Consent: I have reviewed the patients History and Physical, chart, labs and discussed the procedure including the risks, benefits and alternatives for the proposed anesthesia with the patient or authorized representative who has indicated his/her understanding and acceptance.     Dental advisory given  Plan Discussed with: CRNA and Anesthesiologist  Anesthesia Plan Comments:         Anesthesia Quick  Evaluation

## 2023-03-20 NOTE — Op Note (Signed)
03/20/2023  3:15 PM  PATIENT:  [name]  52 y.o. female  PRE-OPERATIVE DIAGNOSIS:  endometrial polyp  POST-OPERATIVE DIAGNOSIS:  endometrial polyp  PROCEDURE:  Procedure(s): DILATATION AND CURETTAGE /HYSTEROSCOPY, POLYP RESECTION  SURGEON:  Jerene Bears  ASSISTANTS: OR staff.    ANESTHESIA:   general  ESTIMATED BLOOD LOSS: 10 mL  BLOOD ADMINISTERED:none   FLUIDS: 500cc LR  UOP: 100cc  SPECIMEN:  endometrial polyp and curetting  DISPOSITION OF SPECIMEN:  PATHOLOGY  FINDINGS: two endometrial polyps, larger one with anterior attachment at lower portion of endometrial cavity, almost completely filling the cavity, second smaller polyp and right fundal region, thin appearing endometrium  DESCRIPTION OF OPERATION: Patient was taken to the operating room.  She is placed in the supine position. SCDs were on her lower extremities and functioning properly. General anesthesia with an LMA was administered without difficulty. Dr. Tacy Dura, anesthesia, oversaw case.  Legs were then placed in the [facility] stirrups in the low lithotomy position. The legs were lifted to the high lithotomy position and the Betadine prep was used on the inner thighs perineum and vagina x3. Patient was draped in a normal standard fashion. An in and out catheterization with a red rubber Foley catheter was performed. Approximately 100 cc of clear urine was noted. A bivalve speculum was placed the vagina. The anterior lip of the cervix was grasped with single-tooth tenaculum.  A paracervical block of 1% lidocaine mixed one-to-one with epinephrine (1:100,000 units).  10 cc was used total. The cervix is dilated up to #21 [facility] dilators. The endometrial cavity sounded to 7 cm.   A 5.97mm diagnostic hysteroscope was obtained. Normal saline was used as a hysteroscopic fluid. The hysteroscope was advanced through the endocervical canal into the endometrial cavity. The tubal ostia were noted bilaterally. Additional findings  included two polyps, the larger almost filling the endometrial cavity and the second at the right fundal region.  Using the Myosure reach device, the polyps were fully resection.  Photodocumentation was obtained before an after removal.  The hysteroscope was removed. A #1 toothed curette was used to curette the cavity until rough gritty texture was noted in all quadrants. At this point no other procedure was needed and this procedure was ended. The hysteroscope was removed. The fluid deficit was 100 cc done with manual calculation. The tenaculum was removed from the anterior lip of the cervix. The speculum was removed from the vagina. The prep was cleansed of the patient's skin. The legs are positioned back in the supine position. Sponge, lap, needle, instrument counts were correct x2. Patient was taken to recovery in stable condition.  COUNTS:  YES  PLAN OF CARE: Transfer to PACU

## 2023-03-20 NOTE — Anesthesia Procedure Notes (Signed)
Procedure Name: LMA Insertion Date/Time: 03/20/2023 2:45 PM  Performed by: Debbe Odea, CRNAPre-anesthesia Checklist: Patient identified, Emergency Drugs available, Suction available and Patient being monitored Patient Re-evaluated:Patient Re-evaluated prior to induction Oxygen Delivery Method: Circle System Utilized Preoxygenation: Pre-oxygenation with 100% oxygen Induction Type: IV induction Ventilation: Mask ventilation without difficulty LMA: LMA inserted LMA Size: 4.0 Number of attempts: 1 Airway Equipment and Method: Bite block Placement Confirmation: positive ETCO2 Tube secured with: Tape Dental Injury: Teeth and Oropharynx as per pre-operative assessment

## 2023-03-20 NOTE — Transfer of Care (Signed)
Immediate Anesthesia Transfer of Care Note  Patient: [name]  Procedure(s) Performed: DILATATION AND CURETTAGE /HYSTEROSCOPY (Vagina )  Patient Location: PACU  Anesthesia Type:General  Level of Consciousness: awake and sedated  Airway & Oxygen Therapy: Patient Spontanous Breathing and Patient connected to face mask oxygen  Post-op Assessment: Report given to RN and Post -op Vital signs reviewed and stable  Post vital signs: Reviewed and stable  Last Vitals:  Vitals Value Taken Time  BP 108/64 03/20/23 1520  Temp    Pulse 82 03/20/23 1524  Resp 17 03/20/23 1524  SpO2 100 % 03/20/23 1524  Vitals shown include unfiled device data.  Last Pain:  Vitals:   03/20/23 1159  TempSrc:   PainSc: 0-No pain         Complications: No notable events documented.

## 2023-03-20 NOTE — Discharge Instructions (Signed)
Post-surgical Instructions, Outpatient Surgery ° °You may expect to feel dizzy, weak, and drowsy for as long as 24 hours after receiving the medicine that made you sleep (anesthetic). For the first 24 hours after your surgery:   °· Do not drive a car, ride a bicycle, participate in physical activities, or take public transportation until you are done taking narcotic pain medicines or as directed by Dr. Hisashi Amadon.  °· Do not drink alcohol or take tranquilizers.  °· Do not take medicine that has not been prescribed by your physicians.  °· Do not sign important papers or make important decisions while on narcotic pain medicines.  °· Have a responsible person with you.  ° °PAIN MANAGEMENT °· Motrin 800mg.  (This is the same as 4-200mg over the counter tablets of Motrin or ibuprofen.)  You may take this every eight hours or as needed for cramping.   °· Vicodin 5/325mg.  For more severe pain, take one or two tablets every four to six hours as needed for pain control.  (Remember that narcotic pain medications increase your risk of constipation.  If this becomes a problem, you may take an over the counter stool softener like Colace 100mg up to four times a day.) ° °DO'S AND DON'T'S °· Do not take a tub bath for one week.  You may shower on the first day after your surgery °· Do not do any heavy lifting for one to two weeks.  This increases the chance of bleeding. °· Do move around as you feel able.  Stairs are fine.  You may begin to exercise again as you feel able.  Do not lift any weights for two weeks. °· Do not put anything in the vagina for two weeks--no tampons, intercourse, or douching.   ° °REGULAR MEDIATIONS/VITAMINS: °· You may restart all of your regular medications as prescribed. °· You may restart all of your vitamins as you normally take them.   ° °PLEASE CALL OR SEEK MEDICAL CARE IF: °· You have persistent nausea and vomiting.  °· You have trouble eating or drinking.  °· You have an oral temperature above 100.5.   °· You have constipation that is not helped by adjusting diet or increasing fluid intake. Pain medicines are a common cause of constipation.  °· You have heavy vaginal bleeding ° °

## 2023-03-20 NOTE — H&P (Signed)
[name] is an 52 y.o. female [phone] here for hysteroscopy and possible polyp resection, D&C due to increasing endometrial thickeness.  She has not had any postmenopausal bleeding but does have ultrasound with 9mm endometrial thickness and cystic spaces that is conssitent with endoemtrial polyp.  She has been on Tamoxifen in the past.  Due to increasing endometrial thickness, hysteroscopy with possible polyp resection, D&C is planned.  Procedure risks and benefits reviewed.  Questions answered today.  Pt ready to proceed.  Pertinent Gynecological History: Menses: none Contraception: post menopausal status DES exposure: denies Blood transfusions: none Sexually transmitted diseases: no past history Previous GYN Procedures:  h/o treatment of abnromal pap smear and myomectomy   Last mammogram: not done due to mastectomy Last pap: normal Date: 05/02/2022 OB History: G3, P1   Menstrual History: Patient's last menstrual period was 03/11/2011.    Past Medical History:  Diagnosis Date   Abdominal pain, epigastric 11/20/2019   Asthma    as a child   Cervical dysplasia    Gastroesophageal reflux disease 11/20/2019   Genetic testing 02/01/2018   Genetic testing was performed in 2015. The [employer] panel was ordered. In total, 23 genes were analyzed as part of this panel: ATM, BARD1, BRCA1, BRCA2, BRIP1, CDH1, CHEK2, EPCAM, MLH1, MRE11A, MSH2, MSH6, MUTYH, NBN, NF1, PALB2, PMS2, PTEN, RAD50, RAD51C, RAD51D, STK11, and TP53. No pathogenic variants were detected. Genetic testing did detect a Variant of Unknown Significance (VUS) at the t   GERD (gastroesophageal reflux disease)    had during chemo   Headache 06/12/2013   History of chemotherapy    History of kidney stones    History of radiation therapy 11/24/13- 01/08/14   reconstructed right breast/chest wall 4500 cGy 25 sessions, electron beam boost 1440 cGy 8 sessions   Liver masses 12/06/2016   Major depressive disorder, in  remission 03/12/2017   Malignant neoplasm of lower-inner quadrant of right breast of female, estrogen receptor positive 2012   Osteoporosis    Pneumonia    PONV (postoperative nausea and vomiting)    Recurrent cancer of right breast 12/22/2013   Thrombocytopenia 05/22/2013   TMJ (dislocation of temporomandibular joint)     Past Surgical History:  Procedure Laterality Date   ADENOIDECTOMY     BREAST LUMPECTOMY WITH NEEDLE LOCALIZATION Right 05/05/2013   Procedure: EXCISION RECURRENT CANCER RIGHT BREAST WITH NEEDLE LOCALOZATION;  Surgeon: [name], MD;  Location: [facility] OR;  Service: General;  Laterality: Right;   BREAST RECONSTRUCTION  06/29/2011   Procedure: BREAST RECONSTRUCTION;  Surgeon: Wayland Denis, DO;  Location: [facility] OR;  Service: Plastics;  Laterality: Bilateral;  Immediate Bilateral Breast Reconstruction with Bilateral Tissue Expanders and placement of  Alloderm    DILATION AND CURETTAGE OF UTERUS     fibroid tumor surgery     lazy eye     corrective surgery   MASTECTOMY W/ SENTINEL NODE BIOPSY  06/29/2011   Procedure: MASTECTOMY WITH SENTINEL LYMPH NODE BIOPSY;  Surgeon: [name], MD;  Location: [facility] OR;  Service: General;  Laterality: Right;  right skin spairing mstectomy and right sentinel lymph node biopsy   PORT-A-CATH REMOVAL  03/12/2012   Procedure: MINOR REMOVAL PORT-A-CATH;  Surgeon: [name], MD;  Location: [facility];  Service: General;  Laterality: N/A;  Upper left   PORTACATH PLACEMENT Left 05/05/2013   Procedure: INSERTION PORT-A-CATH;  Surgeon: [name], MD;  Location[facility] OR;  Service: General;  Laterality: Left;   RHINOPLASTY  uterine cervical treatments  2002   for precancerous lesions    Family History  Adopted: Yes  Problem Relation Age of Onset   Colon cancer Mother 25   Rectal cancer Mother    Cervical cancer Mother    Heart attack Father    Cancer Maternal Aunt 74       stomach and ovarian as separate  primaries   Cancer Maternal Aunt 55       breast   Stomach cancer Maternal Aunt    Cancer Maternal Uncle 58       lung; smoker   Cancer Maternal Uncle 63       prostate   Cancer Maternal Grandmother 83       enodometrial   Cancer Cousin 40       melanoma; mat first cousin, located on neck    Social History:  reports that she quit smoking about 24 years ago. Her smoking use included cigarettes. She started smoking about 28 years ago. She has a 4 pack-year smoking history. She has been exposed to tobacco smoke. She has never used smokeless tobacco. She reports that she does not currently use alcohol. She reports that she does not use drugs.  Allergies:  Allergies  Allergen Reactions   Tussionex Pennkinetic Er [Hydrocod Poli-Chlorphe Poli Er] Other (See Comments)    hallucinations   Other Nausea And Vomiting    Anesthetics make pt sick     Morphine Rash   Morphine And Codeine Anxiety    anxiety   Penicillin G Rash    Medications Prior to Admission  Medication Sig Dispense Refill Last Dose   acetaminophen-caffeine (EXCEDRIN TENSION HEADACHE) 500-65 MG TABS per tablet Take 1 tablet by mouth 3 (three) times daily as needed (headache).   03/20/2023 at 0700   Ascorbic Acid (VITAMIN C PO) Take 1,000 mg by mouth daily.   Past Week   aspirin-acetaminophen-caffeine (EXCEDRIN EXTRA STRENGTH) [phone] MG tablet Take 1 tablet by mouth every 8 (eight) hours as needed for headache.   03/19/2023   Brimonidine Tartrate (LUMIFY) 0.025 % SOLN Place 1 drop into both eyes as needed (dry eyes).   03/20/2023   Collagen-Vitamin C-Biotin (COLLAGEN PO) Take 15 mLs by mouth at bedtime.   Past Week   Magnesium 400 MG TABS Take 400 mg by mouth at bedtime.   Past Week   olopatadine (PATADAY) 0.1 % ophthalmic solution Place 1 drop into both eyes 2 (two) times daily as needed for allergies.   Past Week   Prenatal Vit-Fe Fumarate-FA (PRENATAL MULTIVITAMIN) TABS tablet Take 1 tablet by mouth daily at 12 noon.    Past Week   Probiotic Product (PROBIOTIC DAILY PO) Take 1 capsule by mouth daily.   Past Week   Tryptophan 500 MG TABS Take 1,000 mg by mouth daily.   Past Week   TURMERIC PO Take 1,000 mg by mouth daily. With Curcumin (Liquid)   Past Week   VITAMIN D PO Take 5,000 Units by mouth daily.   Past Week   ibuprofen (ADVIL) 800 MG tablet TAKE 1 TABLET BY MOUTH EVERY 8 HOURS AS NEEDED 30 tablet 5 More than a month   linaclotide (LINZESS) 145 MCG CAPS capsule Take 1 capsule (145 mcg total) by mouth daily before breakfast. (Patient not taking: Reported on 03/15/2023) 30 capsule 6 Not Taking    Review of Systems  Constitutional: Negative.   Genitourinary: Negative.     Blood pressure 108/74, pulse 81, temperature 98.2 F (36.8 C),  temperature source Oral, resp. rate 20, height 5\' 3"  (1.6 m), weight 56.7 kg, last menstrual period 03/11/2011, SpO2 98%. Physical Exam Constitutional:      Appearance: Normal appearance.  Cardiovascular:     Rate and Rhythm: Normal rate.  Pulmonary:     Effort: Pulmonary effort is normal.     Breath sounds: Normal breath sounds.  Neurological:     General: No focal deficit present.     Mental Status: She is alert.  Psychiatric:        Mood and Affect: Mood normal.        Behavior: Behavior normal.     Results for orders placed or performed during the hospital encounter of 03/20/23 (from the past 24 hour(s))  CBC     Status: None   Collection Time: 03/20/23 11:24 AM  Result Value Ref Range   WBC 6.2 4.0 - 10.5 K/uL   RBC 4.46 3.87 - 5.11 MIL/uL   Hemoglobin 13.7 12.0 - 15.0 g/dL   HCT 09.8 11.9 - 14.7 %   MCV 94.4 80.0 - 100.0 fL   MCH 30.7 26.0 - 34.0 pg   MCHC 32.5 30.0 - 36.0 g/dL   RDW 82.9 56.2 - 13.0 %   Platelets 294 150 - 400 K/uL   nRBC 0.0 0.0 - 0.2 %  Comprehensive metabolic panel per protocol     Status: None   Collection Time: 03/20/23 11:24 AM  Result Value Ref Range   Sodium 138 135 - 145 mmol/L   Potassium 3.6 3.5 - 5.1 mmol/L    Chloride 104 98 - 111 mmol/L   CO2 23 22 - 32 mmol/L   Glucose, Bld 75 70 - 99 mg/dL   BUN 13 6 - 20 mg/dL   Creatinine, Ser 8.65 0.44 - 1.00 mg/dL   Calcium 9.9 8.9 - 78.4 mg/dL   Total Protein 7.1 6.5 - 8.1 g/dL   Albumin 4.6 3.5 - 5.0 g/dL   AST 23 15 - 41 U/L   ALT 17 0 - 44 U/L   Alkaline Phosphatase 75 38 - 126 U/L   Total Bilirubin 1.0 <1.2 mg/dL   GFR, Estimated >69 >62 mL/min   Anion gap 11 5 - 15   *Note: Due to a large number of results and/or encounters for the requested time period, some results have not been displayed. A complete set of results can be found in Results Review.    No results found.  Assessment/Plan: 52 yo G3P1 MWF here with thickened endometrium and cystic spaces, family hx of endometrial cancer, tamoxifen use here for hysteroscopy with possible polyp resection, D&C.  Questions answered.  Pt ready to proceed.  Jerene Bears 03/20/2023, 2:13 PM

## 2023-03-20 NOTE — Anesthesia Postprocedure Evaluation (Signed)
Anesthesia Post Note  Patient: [name]  Procedure(s) Performed: DILATATION AND CURETTAGE /HYSTEROSCOPY (Vagina )     Patient location during evaluation: PACU Anesthesia Type: General Level of consciousness: awake and alert Pain management: pain level controlled Vital Signs Assessment: post-procedure vital signs reviewed and stable Respiratory status: spontaneous breathing, nonlabored ventilation, respiratory function stable and patient connected to nasal cannula oxygen Cardiovascular status: blood pressure returned to baseline and stable Postop Assessment: no apparent nausea or vomiting Anesthetic complications: no   No notable events documented.  Last Vitals:  Vitals:   03/20/23 1616 03/20/23 1632  BP: 96/60 92/70  Pulse:  66  Resp: (!) 22 (!) 22  Temp:  36.6 C  SpO2: 98% 95%    Last Pain:  Vitals:   03/20/23 1632  TempSrc:   PainSc: 0-No pain                 Mauriana Dann

## 2023-03-21 ENCOUNTER — Telehealth: Payer: Self-pay | Admitting: Internal Medicine

## 2023-03-21 ENCOUNTER — Encounter (HOSPITAL_COMMUNITY): Payer: Self-pay | Admitting: Obstetrics & Gynecology

## 2023-03-21 LAB — SURGICAL PATHOLOGY

## 2023-03-21 NOTE — Telephone Encounter (Signed)
After reviewing the office note from 02/22/2023, unaware if the office note is finished or not. Please advise.   After reviewing the chart, did not see any mention of a referral to physical therapy. Okay to place referral?

## 2023-03-21 NOTE — Telephone Encounter (Signed)
Patient called stating she is not able to see her office notes on Mychart from her 02/22/23 appointment with Dr. Dimple Casey.    Patient is also requesting a referral to Lavinia Sharps, physical therapist at [facility].  Patient states she specializes in osteoporosis physical therapy.

## 2023-03-22 ENCOUNTER — Encounter: Payer: Managed Care, Other (non HMO) | Admitting: Physical Therapy

## 2023-03-27 ENCOUNTER — Telehealth: Payer: Self-pay | Admitting: Pharmacy Technician

## 2023-03-27 NOTE — Telephone Encounter (Signed)
Devki,  Evenity has been denied due to patients T-Score is not below or at 2.5.  (The BMD measured at [facility] L1-L4 is 0.860 g/cm2 with a T-score of  -2.7. This patient is considered osteoporotic according to World  Health Organization [facility]) criteria.)  The denial letter has been scanned to the media tab for your review.  [email or web address] ,  Please d/c the treatment plan.  Thanks Selena Batten

## 2023-03-27 NOTE — Telephone Encounter (Signed)
Will do. Thanks.

## 2023-03-29 ENCOUNTER — Encounter: Payer: Managed Care, Other (non HMO) | Admitting: Physical Therapy

## 2023-04-05 ENCOUNTER — Encounter: Payer: Managed Care, Other (non HMO) | Admitting: Physical Therapy

## 2023-04-17 ENCOUNTER — Encounter: Payer: Self-pay | Admitting: Internal Medicine

## 2023-04-18 ENCOUNTER — Telehealth: Payer: Self-pay | Admitting: Pharmacy Technician

## 2023-04-18 NOTE — Telephone Encounter (Signed)
 Devki,  Patient insurance has termed.  I have left v/m for new insurance info.  Awaiting call back.  Auth Submission: insurance termed Site of care: Site of care: [facility] Payer:  Medication & CPT/J Code(s) submitted: evenity   Route of submission (phone, fax, portal):  Phone # Fax # Auth type: Buy/Bill PB Units/visits requested:  Reference number:  Approval from:  to

## 2023-04-19 ENCOUNTER — Encounter (HOSPITAL_BASED_OUTPATIENT_CLINIC_OR_DEPARTMENT_OTHER): Payer: Self-pay | Admitting: Obstetrics & Gynecology

## 2023-04-19 ENCOUNTER — Ambulatory Visit: Payer: Commercial Managed Care - HMO | Admitting: Physical Therapy

## 2023-04-19 ENCOUNTER — Ambulatory Visit (HOSPITAL_BASED_OUTPATIENT_CLINIC_OR_DEPARTMENT_OTHER): Payer: No Typology Code available for payment source | Admitting: Obstetrics & Gynecology

## 2023-04-19 VITALS — BP 105/75 | HR 81 | Ht 63.0 in | Wt 125.0 lb

## 2023-04-19 DIAGNOSIS — N816 Rectocele: Secondary | ICD-10-CM

## 2023-04-19 DIAGNOSIS — Z9889 Other specified postprocedural states: Secondary | ICD-10-CM

## 2023-04-19 DIAGNOSIS — M818 Other osteoporosis without current pathological fracture: Secondary | ICD-10-CM

## 2023-04-19 DIAGNOSIS — R935 Abnormal findings on diagnostic imaging of other abdominal regions, including retroperitoneum: Secondary | ICD-10-CM | POA: Diagnosis not present

## 2023-04-19 DIAGNOSIS — R4586 Emotional lability: Secondary | ICD-10-CM

## 2023-04-19 DIAGNOSIS — N840 Polyp of corpus uteri: Secondary | ICD-10-CM | POA: Diagnosis not present

## 2023-04-19 NOTE — Progress Notes (Signed)
 GYNECOLOGY  VISIT  CC:   post op recheck  HPI: 53 y.o. [id number] Married White or Caucasian female here for recheck after undergoing hysteroscopy, polyp resection, and D&C on 03/20/2023.  Images and pathology from the procedure reviewed.  She had a little spotting and post op bleeding.  Only needed to use a panty liner.  This only lasted a few days.  About a week ago, she did notice some brownish discharge.  She is having some PMS symptoms that feel like it started after the surgery.  Bowel function is Normal.  Bladder function is normal.    She feels like she is having some prolapse.  She reports she discussed this with Dr. Rudy in the past.  Was advised to do an exam standing.    She also feels like she has some decreased mood.  This is not related to surgery.  She has some questions about herbal supplements.  Reviewed supplement she has questions about today.  Suggested St John Wort.    Pap 05/02/2022 neg with neg HR HPV.  Colonoscopy 02/2022, f/u 5 years.     MEDS:   Current Outpatient Medications on File Prior to Visit  Medication Sig Dispense Refill   acetaminophen -caffeine (EXCEDRIN TENSION HEADACHE) 500-65 MG TABS per tablet Take 1 tablet by mouth 3 (three) times daily as needed (headache).     Ascorbic Acid (VITAMIN C PO) Take 1,000 mg by mouth daily.     aspirin-acetaminophen -caffeine (EXCEDRIN EXTRA STRENGTH) 250-250-65 MG tablet Take 1 tablet by mouth every 8 (eight) hours as needed for headache.     Brimonidine Tartrate (LUMIFY) 0.025 % SOLN Place 1 drop into both eyes as needed (dry eyes).     Collagen-Vitamin C-Biotin  (COLLAGEN PO) Take 15 mLs by mouth at bedtime.     HYDROcodone -acetaminophen  (NORCO/VICODIN) 5-325 MG tablet Take 1 tablet by mouth every 6 (six) hours as needed for moderate pain (pain score 4-6). 8 tablet 0   ibuprofen  (ADVIL ) 800 MG tablet Take 1 tablet (800 mg total) by mouth every 8 (eight) hours as needed. 20 tablet 0   Magnesium 400 MG TABS Take 400 mg by  mouth at bedtime.     olopatadine (PATADAY) 0.1 % ophthalmic solution Place 1 drop into both eyes 2 (two) times daily as needed for allergies.     Prenatal Vit-Fe Fumarate-FA (PRENATAL MULTIVITAMIN) TABS tablet Take 1 tablet by mouth daily at 12 noon.     Probiotic Product (PROBIOTIC DAILY PO) Take 1 capsule by mouth daily.     Tryptophan 500 MG TABS Take 1,000 mg by mouth daily.     TURMERIC PO Take 1,000 mg by mouth daily. With Curcumin (Liquid)     VITAMIN D  PO Take 5,000 Units by mouth daily.     linaclotide  (LINZESS ) 145 MCG CAPS capsule Take 1 capsule (145 mcg total) by mouth daily before breakfast. (Patient not taking: Reported on 03/15/2023) 30 capsule 6   No current facility-administered medications on file prior to visit.    SH:  Smoking No    PHYSICAL EXAMINATION:    BP 105/75 (BP Location: Left Arm, Patient Position: Sitting, Cuff Size: Normal)   Pulse 81   Ht 5' 3 (1.6 m) Comment: Reported  Wt 125 lb (56.7 kg)   LMP 03/11/2011   BMI 22.14 kg/m     General appearance: alert, cooperative and appears stated age Abdomen: soft, non-tender; bowel sounds normal; no masses,  no organomegaly Incisions:  C/D/I  Pelvic: External genitalia:  no lesions              Urethra:  normal appearing urethra with no masses, tenderness or lesions              Bartholins and Skenes: normal                 Vagina: normal appearing vagina with normal color and discharge, no lesions, rectocele noted              Cervix: no lesions              Bimanual Exam:  Uterus:  normal size, contour, position, consistency, mobility, non-tender              Adnexa: no mass, fullness, tenderness  Chaperone, Bascom Kotyk, was present for exam.  Assessment/Plan: 1. History of hysteroscopy (Primary) - reviewed pathology and images  2. Endometrial polyp  3. Abnormal ultrasound of endometrium  4. Rectocele - findings reviewed.  Reviewed symptoms which are occasional for her.  For now, feel this is ok  to monitor   5. Mood changes - Declines any prescription treatment   6. Other osteoporosis without current pathological fracture - has been discussing this with her rheumatologist  Total time with pt:  26 minutes

## 2023-04-23 ENCOUNTER — Other Ambulatory Visit: Payer: Self-pay | Admitting: Pharmacist

## 2023-04-23 ENCOUNTER — Telehealth: Payer: Self-pay | Admitting: Pharmacy Technician

## 2023-04-23 NOTE — Telephone Encounter (Signed)
 Auth Submission: APPROVED Site of care: Site of care: [facility] Payer: [employer]) Medication & CPT/J Code(s) submitted: Evenity  (Romosozumab ) [id number] Route of submission (phone, fax, portal):  Phone # Fax # Auth type: PHARMACY Units/visits requested: 12 doses Reference number: [id number] CASE: Approval from: 04/19/23 to 04/18/24   REF: Hannah-S 2:11p Medical: buy/bill

## 2023-04-23 NOTE — Progress Notes (Signed)
 Therapy plan placed for Evenity SQ [phone]) for [facility] to start benefits investigation  Diagnosis: Osteoporosis due to aromatase inhibitor  Provider: Dr. Sheliah Hatch  Dose: 210mg  SQ every month for 12 months  Last Clinic Visit: 02/22/23 Next Clinic Visit: 08/22/23  Pertinent Labs: CMP, PTH, SPEP on 02/22/23  wnl, Vitamin D and TSH on 10/09/22 wnl  Chesley Mires, PharmD, MPH, BCPS, CPP Clinical Pharmacist (Rheumatology and Pulmonology)

## 2023-04-25 ENCOUNTER — Encounter: Payer: Commercial Managed Care - HMO | Admitting: Physical Therapy

## 2023-05-02 ENCOUNTER — Encounter: Payer: Commercial Managed Care - HMO | Admitting: Physical Therapy

## 2023-05-09 ENCOUNTER — Ambulatory Visit: Payer: Commercial Managed Care - HMO | Admitting: Physical Therapy

## 2023-05-16 ENCOUNTER — Encounter: Payer: Commercial Managed Care - HMO | Admitting: Physical Therapy

## 2023-05-23 ENCOUNTER — Encounter: Payer: Commercial Managed Care - HMO | Admitting: Physical Therapy

## 2023-05-30 ENCOUNTER — Encounter: Payer: Commercial Managed Care - HMO | Admitting: Physical Therapy

## 2023-06-12 ENCOUNTER — Encounter: Payer: Self-pay | Admitting: Gastroenterology

## 2023-06-13 ENCOUNTER — Other Ambulatory Visit: Payer: Self-pay

## 2023-06-13 MED ORDER — SUCRALFATE 1 G PO TABS: 1.0000 g | ORAL_TABLET | Freq: Two times a day (BID) | ORAL | 0 refills | Status: DC

## 2023-07-13 ENCOUNTER — Other Ambulatory Visit (HOSPITAL_COMMUNITY): Payer: Self-pay

## 2023-07-13 ENCOUNTER — Telehealth: Payer: Self-pay

## 2023-07-13 NOTE — Telephone Encounter (Signed)
 Pharmacy Patient Advocate Encounter   Received notification from CoverMyMeds that prior authorization for Linzess capsules is required/requested.   Insurance verification completed.   The patient is insured through  [employer] / IT consultant  .   Per test claim: PA required; PA submitted to above mentioned insurance via CoverMyMeds Key/confirmation #/EOC [id number] Status is pending

## 2023-07-16 MED ORDER — LINACLOTIDE 145 MCG PO CAPS: 145.0000 ug | ORAL_CAPSULE | Freq: Every day | ORAL | 6 refills | Status: DC

## 2023-07-16 NOTE — Telephone Encounter (Signed)
 Pharmacy Patient Advocate Encounter  Received notification from  [employer]  that Prior Authorization for  Linzess capsules has been APPROVED from 07-13-2023 to 01-12-2024   PA #/Case ID/Reference #: [facility]

## 2023-07-16 NOTE — Telephone Encounter (Signed)
 Spoke with patient and informed her Linzess 145 mcg was approved and sent in a new prescription to [facility] for her for them to rerun.

## 2023-07-19 ENCOUNTER — Other Ambulatory Visit: Payer: Self-pay | Admitting: Nurse Practitioner

## 2023-07-19 DIAGNOSIS — K769 Liver disease, unspecified: Secondary | ICD-10-CM

## 2023-08-03 ENCOUNTER — Encounter (HOSPITAL_COMMUNITY): Payer: Self-pay

## 2023-08-03 ENCOUNTER — Ambulatory Visit (HOSPITAL_COMMUNITY)

## 2023-08-07 ENCOUNTER — Ambulatory Visit (HOSPITAL_COMMUNITY)
Admission: RE | Admit: 2023-08-07 | Discharge: 2023-08-07 | Disposition: A | Discharge status: Home / Self Care | Source: Ambulatory Visit | Attending: Nurse Practitioner | Admitting: Nurse Practitioner

## 2023-08-07 ENCOUNTER — Other Ambulatory Visit

## 2023-08-07 DIAGNOSIS — K769 Liver disease, unspecified: Secondary | ICD-10-CM | POA: Diagnosis present

## 2023-08-07 MED ORDER — GADOXETATE DISODIUM 0.25 MMOL/ML IV SOLN
6.0000 mL | Freq: Once | INTRAVENOUS | Status: AC | PRN
  Administered 2023-08-07: 6 mL via INTRAVENOUS

## 2023-08-08 ENCOUNTER — Other Ambulatory Visit: Payer: Self-pay | Admitting: Nurse Practitioner

## 2023-08-08 ENCOUNTER — Encounter: Payer: Self-pay | Admitting: Nurse Practitioner

## 2023-08-08 DIAGNOSIS — K769 Liver disease, unspecified: Secondary | ICD-10-CM

## 2023-08-13 NOTE — Progress Notes (Deleted)
 Office Visit Note  Patient: [name]             Date of Birth: Oct 14, 1970           MRN: [id number]             PCP: Adelia Homestead, MD Referring: Adelia Homestead, * Visit Date: 08/22/2023   Subjective:  No chief complaint on file.   History of Present Illness: [name] is a 53 y.o. female here for follow up for joint pain and fatigue with persistent positive ANA.    Previous HPI 02/22/2023 [name] is a 53 y.o. female here for follow up for joint pain and fatigue with persistent positive ANA.  She has not had any major new acute illness or complications.  Still experiencing leg swelling with sensitivity at affected areas.  Skin is dry all over breaks out with plaques and patches on the face for which she is prescribed topical steroid from dermatology.  In September she had updated bone density scan checked consistent with osteoporosis.  Has history of breast cancer including tamoxifen treatment.  No history of fractures.  She has been taking a vitamin D  supplement with last level of 9 in July.   Previous HPI 08/24/2022 [name] is a 53 y.o. female here for follow up for joint pain and fatigue with positive ANA no specific connective tissue disease identified at evaluation so far.  Since our last visit she had reassuring pathology results for the thickened endometrial biopsy.  Subsequently followed with gastroenterology she had additional abdominal imaging with the finding of multiple liver cysts with labs consistently showing normal liver function.  She does have constipation does not have severe abdominal pain.  Does have some dysuria but does not notice any blood or discoloration or odor.  Ongoing back pain positive for both lying at rest and sometimes up and moving no radiation or numbness in the legs.   Previous HPI 05/25/22 [name] is a 53 y.o. female here for follow up for joint pain and fatigue with positive ANA testing.  Lab  workup at our initial visit was entirely negative for specific antibodies but showed ANA consistent positive at a higher titer than before.  Also had normal thyroid  function.  Since that visit she has had some additional workup with abdominal MRI due to worsened bloating abdominal discomfort and unintentional weight loss.  Also had endometrial biopsy yesterday due to thickening and cystic changes noted on transvaginal ultrasound exam.  Besides these she continues having aches and pains most prominently throughout her legs on a daily basis.  Does see distal leg swelling usually by the end of the day without any overlying skin rash or discoloration.  Ongoing eye redness and irritation and feels dry most of the time, she thinks there may be some discoloration but no new changes in visual acuity.  Still has excessive fatigue regardless of sleep pattern.  She is experiencing periods of dizziness often provoked with time spent outdoors but sometimes just with positional change during no particular activity.     Previous HPI 04/25/22 [name] is a 53 y.o. female here for evaluation of positive ANA checked in association with ongoing symptoms including joint pain and fatigue most predominantly.  She has had some chronic issues with back pain and multiple levels attributed to some degenerative joint disease in the spine that is more longstanding.  However for about the past 1 year she has  increase in symptoms and also experiencing some more pain in other areas her extremities and in bilateral hips.  There is more tenderness to pressure in the distal legs on both sides below the level of the knee.  She notices some possible swelling involving lower extremities but none localized to specific joint areas.  She does not notice any particular erythema or warmth in affected areas.  She tends to notice worsening after period of increased activity or exertion taking 1 or 2 days to recover from fatigue and aches.  She was  taking Excedrin which was partially helpful for symptoms but stopped due to irritation with upper abdominal discomfort. Outside of joint problems she experiences persistent fatigue with a feeling of very low energy level.  She does not notice any particular sleep disruption and is not sleepy or taking naps during the daytime.   She has chronic gastrointestinal symptoms with pretty severe constipation predominant irritable bowel symptoms.  She also has some history of GERD without known upper GI complication.  She has had multiple liver masses present on CT imaging but with no high risk features identified and negative for increased metabolic activity on PET scan.   She reports generalized alopecia with some hair thinning or breaking without any focal bald spots or rash or scarring on the scalp.  Skin sensitivity to light with occasional erythema but no persistent rashes.  She has some chronic dryness affecting eyes and mouth no history of oral or nasal ulcers.  No persistent lymphadenopathy.  She denies typical Raynaud's symptoms with no fingernail changes.   Breast cancer Hx [name] is a 53 year old woman with h/o ER positive breast cancer diagnosed in 2012 s/p bilateral mastectomies, chemotherapy, and tamoxifen. She developed recurrence in 2015, stage IA triple positive breast cancer s/p TDM1, adjuvant radiation, and unable to tolerate long term antiestrogen therapy.    Labs reviewed 02/2021 ANA 1:40 homogenous dsDNA, Sm, SSA, SSB, Scl-70 Abs neg RF neg   Imaging reviewed 01/20/21 MRI Lumbar spine Disc levels: T12-L1: Negative. L1-2: Negative. L2-3: Negative. L3-4: A rightward disc protrusion is present. Mild facet hypertrophy is noted bilaterally. Mild right foraminal narrowing is new. The central canal and left foramen are patent. L4-5: A broad-based disc protrusion is asymmetric to the right. Mild facet hypertrophy and ligamentum flavum thickening are noted. Moderate right subarticular  narrowing is present. Progressive moderate foraminal narrowing is noted bilaterally, right greater than left. L5-S1: Mild facet hypertrophy is noted bilaterally. No significant disc protrusion or stenosis is present.   03/21/21 Xray left knee Trace patellofemoral spurring. Otherwise unremarkable radiographic appearance.   No Rheumatology ROS completed.   PMFS History:  Patient Active Problem List   Diagnosis Date Noted   Abnormal ultrasound of endometrium 03/20/2023   Osteoporosis due to aromatase inhibitor 02/22/2023   Constipation 05/25/2022   Other fatigue 05/25/2022   Dizziness 05/25/2022   Endometrial thickening on ultrasound 05/24/2022   Abnormal laboratory test result 04/25/2022   Snoring 07/25/2021   Excessive daytime sleepiness 07/25/2021   Routine general medical examination at a health care facility 07/08/2021   Positive ANA (antinuclear antibody) 03/18/2021   Left leg pain 03/18/2021   Arthralgia 03/11/2021   Back pain 02/26/2021   Neck pain 02/26/2021   Gastroesophageal reflux disease 11/20/2019   Genetic testing 02/01/2018   Major depressive disorder, in remission 03/12/2017   Liver masses 12/06/2016   Recurrent cancer of right breast 12/22/2013   Headache 06/12/2013   Thrombocytopenia 05/22/2013   Malignant neoplasm  of lower-inner quadrant of right breast of female, estrogen receptor positive 2012    Past Medical History:  Diagnosis Date   Abdominal pain, epigastric 11/20/2019   Asthma    as a child   Cervical dysplasia    Gastroesophageal reflux disease 11/20/2019   Genetic testing 02/01/2018   Genetic testing was performed in 2015. The [employer] panel was ordered. In total, 23 genes were analyzed as part of this panel: ATM, BARD1, BRCA1, BRCA2, BRIP1, CDH1, CHEK2, EPCAM, MLH1, MRE11A, MSH2, MSH6, MUTYH, NBN, NF1, PALB2, PMS2, PTEN, RAD50, RAD51C, RAD51D, STK11, and TP53. No pathogenic variants were detected. Genetic testing did detect a Variant of  Unknown Significance (VUS) at the t   GERD (gastroesophageal reflux disease)    had during chemo   Headache 06/12/2013   History of chemotherapy    History of kidney stones    History of radiation therapy 11/24/13- 01/08/14   reconstructed right breast/chest wall 4500 cGy 25 sessions, electron beam boost 1440 cGy 8 sessions   Liver masses 12/06/2016   Major depressive disorder, in remission 03/12/2017   Malignant neoplasm of lower-inner quadrant of right breast of female, estrogen receptor positive 2012   Osteoporosis    Pneumonia    PONV (postoperative nausea and vomiting)    Recurrent cancer of right breast 12/22/2013   Thrombocytopenia 05/22/2013   TMJ (dislocation of temporomandibular joint)     Family History  Adopted: Yes  Problem Relation Age of Onset   Colon cancer Mother 38   Rectal cancer Mother    Cervical cancer Mother    Heart attack Father    Cancer Maternal Aunt 68       stomach and ovarian as separate primaries   Cancer Maternal Aunt 55       breast   Stomach cancer Maternal Aunt    Cancer Maternal Uncle 46       lung; smoker   Cancer Maternal Uncle 63       prostate   Cancer Maternal Grandmother 20       enodometrial   Cancer Cousin 40       melanoma; mat first cousin, located on neck   Past Surgical History:  Procedure Laterality Date   ADENOIDECTOMY     BREAST LUMPECTOMY WITH NEEDLE LOCALIZATION Right 05/05/2013   Procedure: EXCISION RECURRENT CANCER RIGHT BREAST WITH NEEDLE LOCALOZATION;  Surgeon: Levert Ready, MD;  Location: [facility] OR;  Service: General;  Laterality: Right;   BREAST RECONSTRUCTION  06/29/2011   Procedure: BREAST RECONSTRUCTION;  Surgeon: Marilou Showman, DO;  Location: [facility] OR;  Service: Plastics;  Laterality: Bilateral;  Immediate Bilateral Breast Reconstruction with Bilateral Tissue Expanders and placement of  Alloderm    DILATION AND CURETTAGE OF UTERUS     fibroid tumor surgery     HYSTEROSCOPY WITH D & C N/A 03/20/2023   Procedure:  DILATATION AND CURETTAGE /HYSTEROSCOPY;  Surgeon: Lillian Rein, MD;  Location: [facility] OR;  Service: Gynecology;  Laterality: N/A;   lazy eye     corrective surgery   MASTECTOMY W/ SENTINEL NODE BIOPSY  06/29/2011   Procedure: MASTECTOMY WITH SENTINEL LYMPH NODE BIOPSY;  Surgeon: Levert Ready, MD;  Location: [facility] OR;  Service: General;  Laterality: Right;  right skin spairing mstectomy and right sentinel lymph node biopsy   PORT-A-CATH REMOVAL  03/12/2012   Procedure: MINOR REMOVAL PORT-A-CATH;  Surgeon: Levert Ready, MD;  Location: [facility];  Service: General;  Laterality: N/A;  Upper  left   PORTACATH PLACEMENT Left 05/05/2013   Procedure: INSERTION PORT-A-CATH;  Surgeon: Levert Ready, MD;  Location: [facility] OR;  Service: General;  Laterality: Left;   RHINOPLASTY     uterine cervical treatments  2002   for precancerous lesions   Social History   Social History Narrative   She was adopted by her grandparents as a child.  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  She works in a Agricultural consultant.right handed   Two story home    Drinks caffeine occasional   Right handed    There is no immunization history on file for this patient.   Objective: Vital Signs: LMP 03/11/2011    Physical Exam   Musculoskeletal Exam: ***  CDAI Exam: CDAI Score: -- Patient Global: --; Provider Global: -- Swollen: --; Tender: -- Joint Exam 08/22/2023   No joint exam has been documented for this visit   There is currently no information documented on the homunculus. Go to the Rheumatology activity and complete the homunculus joint exam.  Investigation: No additional findings.  Imaging: MR ABDOMEN MRCP W WO CONTAST Result Date: 08/08/2023 CLINICAL DATA:  Follow-up hepatic masses.  Prior breast carcinoma. EXAM: MRI ABDOMEN WITHOUT AND WITH CONTRAST (INCLUDING MRCP) TECHNIQUE: Multiplanar multisequence MR imaging of the abdomen was performed both before and  after the administration of intravenous contrast. Heavily T2-weighted images of the biliary and pancreatic ducts were obtained, and three-dimensional MRCP images were rendered by post processing. CONTRAST:  6 mL Eovist  COMPARISON:  05/23/2022 FINDINGS: Lower chest: No acute findings. Hepatobiliary: Several small hepatic masses are again seen in the right and left lobes which are stable in size since previous study. All of these masses show T2 isointensity, homogeneous arterial phase hyperenhancement, and persistent enhancement on delayed hepatobiliary phase imaging, consistent with benign focal nodular hyperplasia. No new or suspicious appearing hepatic masses are identified. Gallbladder is unremarkable. No evidence of biliary ductal dilatation. Pancreas:  No mass or inflammatory changes. Spleen:  Within normal limits in size and appearance. Adrenals/Urinary Tract: No suspicious masses identified. No evidence of hydronephrosis. Stomach/Bowel: Unremarkable. Vascular/Lymphatic: No pathologically enlarged lymph nodes identified. No acute vascular findings. Other:  None. Musculoskeletal:  No suspicious bone lesions identified. IMPRESSION: Stable small hypervascular hepatic masses, consistent with benign focal nodular hyperplasia. No evidence of metastatic disease or other acute findings. Electronically Signed   By: Marlyce Sine M.D.   On: 08/08/2023 13:07   MR 3D Recon At Scanner Result Date: 08/08/2023 CLINICAL DATA:  Follow-up hepatic masses.  Prior breast carcinoma. EXAM: MRI ABDOMEN WITHOUT AND WITH CONTRAST (INCLUDING MRCP) TECHNIQUE: Multiplanar multisequence MR imaging of the abdomen was performed both before and after the administration of intravenous contrast. Heavily T2-weighted images of the biliary and pancreatic ducts were obtained, and three-dimensional MRCP images were rendered by post processing. CONTRAST:  6 mL Eovist  COMPARISON:  05/23/2022 FINDINGS: Lower chest: No acute findings. Hepatobiliary:  Several small hepatic masses are again seen in the right and left lobes which are stable in size since previous study. All of these masses show T2 isointensity, homogeneous arterial phase hyperenhancement, and persistent enhancement on delayed hepatobiliary phase imaging, consistent with benign focal nodular hyperplasia. No new or suspicious appearing hepatic masses are identified. Gallbladder is unremarkable. No evidence of biliary ductal dilatation. Pancreas:  No mass or inflammatory changes. Spleen:  Within normal limits in size and appearance. Adrenals/Urinary Tract: No suspicious masses identified. No evidence of hydronephrosis. Stomach/Bowel: Unremarkable. Vascular/Lymphatic: No pathologically  enlarged lymph nodes identified. No acute vascular findings. Other:  None. Musculoskeletal:  No suspicious bone lesions identified. IMPRESSION: Stable small hypervascular hepatic masses, consistent with benign focal nodular hyperplasia. No evidence of metastatic disease or other acute findings. Electronically Signed   By: Marlyce Sine M.D.   On: 08/08/2023 13:07    Recent Labs: Lab Results  Component Value Date   WBC 6.2 03/20/2023   HGB 13.7 03/20/2023   PLT 294 03/20/2023   NA 138 03/20/2023   K 3.6 03/20/2023   CL 104 03/20/2023   CO2 23 03/20/2023   GLUCOSE 75 03/20/2023   BUN 13 03/20/2023   CREATININE 0.85 03/20/2023   BILITOT 1.0 03/20/2023   ALKPHOS 75 03/20/2023   AST 23 03/20/2023   ALT 17 03/20/2023   PROT 7.1 03/20/2023   ALBUMIN 4.6 03/20/2023   CALCIUM 9.9 03/20/2023   GFRAA >60 08/11/2019    Speciality Comments: No specialty comments available.  Procedures:  No procedures performed Allergies: Tussionex pennkinetic er [hydrocod poli-chlorphe poli er], Other, Morphine , Morphine  and codeine , and Penicillin g   Assessment / Plan:     Visit Diagnoses: No diagnosis found.  ***  Orders: No orders of the defined types were placed in this encounter.  No orders of the defined  types were placed in this encounter.    Follow-Up Instructions: No follow-ups on file.   Glena Landau, RT  Note - This record has been created using [employer].  Chart creation errors have been sought, but may not always  have been located. Such creation errors do not reflect on  the standard of medical care.

## 2023-08-22 ENCOUNTER — Ambulatory Visit: Payer: Managed Care, Other (non HMO) | Admitting: Internal Medicine

## 2023-08-22 DIAGNOSIS — G8929 Other chronic pain: Secondary | ICD-10-CM

## 2023-08-22 DIAGNOSIS — K5900 Constipation, unspecified: Secondary | ICD-10-CM

## 2023-08-22 DIAGNOSIS — M818 Other osteoporosis without current pathological fracture: Secondary | ICD-10-CM

## 2023-08-22 DIAGNOSIS — R768 Other specified abnormal immunological findings in serum: Secondary | ICD-10-CM

## 2023-08-22 DIAGNOSIS — K219 Gastro-esophageal reflux disease without esophagitis: Secondary | ICD-10-CM

## 2023-08-22 DIAGNOSIS — M2550 Pain in unspecified joint: Secondary | ICD-10-CM

## 2023-09-24 ENCOUNTER — Telehealth: Payer: Self-pay | Admitting: Adult Health

## 2023-09-24 ENCOUNTER — Other Ambulatory Visit: Payer: Self-pay | Admitting: Adult Health

## 2023-09-24 DIAGNOSIS — Z170 Estrogen receptor positive status [ER+]: Secondary | ICD-10-CM

## 2023-09-24 NOTE — Telephone Encounter (Signed)
 TC from Pt stating she would like to come in today for her lab appointment. Pt stated she wanted to discuss labs that needed to be drawn. Pt stated she wanted the same labs drawn from last year but wanted to add magnesium to the order. Discussed with Loris Ros. Labs ordered.

## 2023-09-24 NOTE — Progress Notes (Unsigned)
 [name]  dh

## 2023-09-25 ENCOUNTER — Inpatient Hospital Stay: Payer: Self-pay

## 2023-09-25 ENCOUNTER — Other Ambulatory Visit: Payer: Commercial Managed Care - HMO

## 2023-09-26 ENCOUNTER — Telehealth: Payer: Self-pay

## 2023-09-26 ENCOUNTER — Inpatient Hospital Stay: Attending: Adult Health

## 2023-09-26 DIAGNOSIS — T386X5A Adverse effect of antigonadotrophins, antiestrogens, antiandrogens, not elsewhere classified, initial encounter: Secondary | ICD-10-CM | POA: Insufficient documentation

## 2023-09-26 DIAGNOSIS — Z853 Personal history of malignant neoplasm of breast: Secondary | ICD-10-CM | POA: Insufficient documentation

## 2023-09-26 DIAGNOSIS — M818 Other osteoporosis without current pathological fracture: Secondary | ICD-10-CM | POA: Insufficient documentation

## 2023-09-26 DIAGNOSIS — Z170 Estrogen receptor positive status [ER+]: Secondary | ICD-10-CM

## 2023-09-26 LAB — CBC WITH DIFFERENTIAL (CANCER CENTER ONLY)
Abs Immature Granulocytes: 0.01 10*3/uL (ref 0.00–0.07)
Basophils Absolute: 0 10*3/uL (ref 0.0–0.1)
Basophils Relative: 1 %
Eosinophils Absolute: 0.1 10*3/uL (ref 0.0–0.5)
Eosinophils Relative: 2 %
HCT: 37.7 % (ref 36.0–46.0)
Hemoglobin: 12.9 g/dL (ref 12.0–15.0)
Immature Granulocytes: 0 %
Lymphocytes Relative: 27 %
Lymphs Abs: 1.6 10*3/uL (ref 0.7–4.0)
MCH: 30.9 pg (ref 26.0–34.0)
MCHC: 34.2 g/dL (ref 30.0–36.0)
MCV: 90.4 fL (ref 80.0–100.0)
Monocytes Absolute: 0.4 10*3/uL (ref 0.1–1.0)
Monocytes Relative: 6 %
Neutro Abs: 3.7 10*3/uL (ref 1.7–7.7)
Neutrophils Relative %: 64 %
Platelet Count: 267 10*3/uL (ref 150–400)
RBC: 4.17 MIL/uL (ref 3.87–5.11)
RDW: 12.2 % (ref 11.5–15.5)
WBC Count: 5.8 10*3/uL (ref 4.0–10.5)
nRBC: 0 % (ref 0.0–0.2)

## 2023-09-26 LAB — FERRITIN: Ferritin: 82 ng/mL (ref 11–307)

## 2023-09-26 LAB — CMP (CANCER CENTER ONLY)
ALT: 12 U/L (ref 0–44)
AST: 19 U/L (ref 15–41)
Albumin: 4.7 g/dL (ref 3.5–5.0)
Alkaline Phosphatase: 86 U/L (ref 38–126)
Anion gap: 7 (ref 5–15)
BUN: 12 mg/dL (ref 6–20)
CO2: 29 mmol/L (ref 22–32)
Calcium: 9.8 mg/dL (ref 8.9–10.3)
Chloride: 105 mmol/L (ref 98–111)
Creatinine: 0.91 mg/dL (ref 0.44–1.00)
GFR, Estimated: >60 mL/min (ref >60)
Glucose, Bld: 80 mg/dL (ref 70–99)
Potassium: 3.8 mmol/L (ref 3.5–5.1)
Sodium: 141 mmol/L (ref 135–145)
Total Bilirubin: 0.6 mg/dL (ref 0.0–1.2)
Total Protein: 7 g/dL (ref 6.5–8.1)

## 2023-09-26 LAB — IRON AND IRON BINDING CAPACITY (CC-WL,HP ONLY)
Iron: 94 ug/dL (ref 28–170)
Saturation Ratios: 27 % (ref 10.4–31.8)
TIBC: 354 ug/dL (ref 250–450)
UIBC: 260 ug/dL (ref 148–442)

## 2023-09-26 LAB — MAGNESIUM: Magnesium: 1.9 mg/dL (ref 1.7–2.4)

## 2023-09-26 LAB — TSH: TSH: 1.55 u[IU]/mL (ref 0.350–4.500)

## 2023-09-26 LAB — CORTISOL: Cortisol, Plasma: 8.2 ug/dL

## 2023-09-26 LAB — VITAMIN D 25 HYDROXY (VIT D DEFICIENCY, FRACTURES): Vit D, 25-Hydroxy: 54.1 ng/mL (ref 30–100)

## 2023-09-27 ENCOUNTER — Encounter: Payer: Self-pay | Admitting: Adult Health

## 2023-09-27 LAB — DHEA-SULFATE: DHEA-SO4: 45.3 ug/dL (ref 41.2–243.7)

## 2023-09-27 LAB — CANCER ANTIGEN 27.29: CA 27.29: 31.6 U/mL (ref 0.0–38.6)

## 2023-09-27 NOTE — Telephone Encounter (Signed)
 Returned pt call. Pt states that she was calling to check on her cholesterol labs. I advise pt that the Np will be returning her call back to discuss labs.

## 2023-09-28 ENCOUNTER — Other Ambulatory Visit: Payer: Self-pay | Admitting: *Deleted

## 2023-09-28 DIAGNOSIS — C50311 Malignant neoplasm of lower-inner quadrant of right female breast: Secondary | ICD-10-CM

## 2023-09-28 LAB — LIPID PANEL
Cholesterol: 240 mg/dL — ABNORMAL HIGH (ref 0–200)
HDL: 110 mg/dL (ref >40)
LDL Cholesterol: 119 mg/dL — ABNORMAL HIGH (ref 0–99)
Total CHOL/HDL Ratio: 2.2 ratio
Triglycerides: 54 mg/dL (ref <150)
VLDL: 11 mg/dL (ref 0–40)

## 2023-10-03 LAB — ESTRADIOL, ULTRA SENS: Estradiol, Sensitive: <2.5 pg/mL

## 2023-10-09 ENCOUNTER — Inpatient Hospital Stay

## 2023-10-09 ENCOUNTER — Other Ambulatory Visit: Payer: Self-pay | Admitting: *Deleted

## 2023-10-09 ENCOUNTER — Inpatient Hospital Stay: Payer: Self-pay | Attending: Adult Health | Admitting: Adult Health

## 2023-10-09 ENCOUNTER — Encounter: Payer: Self-pay | Admitting: Adult Health

## 2023-10-09 VITALS — BP 95/68 | HR 79 | Temp 98.4°F | Resp 17 | Ht 63.0 in

## 2023-10-09 DIAGNOSIS — Z170 Estrogen receptor positive status [ER+]: Secondary | ICD-10-CM

## 2023-10-09 DIAGNOSIS — Z923 Personal history of irradiation: Secondary | ICD-10-CM | POA: Insufficient documentation

## 2023-10-09 DIAGNOSIS — E7800 Pure hypercholesterolemia, unspecified: Secondary | ICD-10-CM

## 2023-10-09 DIAGNOSIS — R978 Other abnormal tumor markers: Secondary | ICD-10-CM | POA: Insufficient documentation

## 2023-10-09 DIAGNOSIS — Z853 Personal history of malignant neoplasm of breast: Secondary | ICD-10-CM | POA: Diagnosis not present

## 2023-10-09 DIAGNOSIS — C50311 Malignant neoplasm of lower-inner quadrant of right female breast: Secondary | ICD-10-CM

## 2023-10-09 DIAGNOSIS — Z1502 Genetic susceptibility to malignant neoplasm of ovary: Secondary | ICD-10-CM | POA: Diagnosis present

## 2023-10-09 MED ORDER — LORAZEPAM 0.5 MG PO TABS: 0.5000 mg | ORAL_TABLET | Freq: Every day | ORAL | 0 refills | Status: AC | PRN

## 2023-10-09 MED ORDER — SUCRALFATE 1 G PO TABS: 1.0000 g | ORAL_TABLET | Freq: Two times a day (BID) | ORAL | 0 refills | Status: AC

## 2023-10-09 NOTE — Progress Notes (Signed)
 [name] Follow up:    Judith Almarie LABOR, MD [address] [name] [address]   DIAGNOSIS: Cancer Staging  Malignant neoplasm of lower-inner quadrant of right breast of female, estrogen receptor positive Staging form: Breast, AJCC 7th Edition - Clinical: Stage IA (T1c, N0, M0) - Signed by Judith Sandria BROCKS, MD on 03/30/2015  Recurrent cancer of right breast Staging form: Breast, AJCC 7th Edition - Clinical: Stage IA (T1b, N0, M0) - Signed by Judith Sandria BROCKS, MD on 03/30/2015    SUMMARY OF ONCOLOGIC HISTORY: (1) status post right breast lower inner quadrant biopsy in September 2012 for a clinically 2.0 cm invasive ductal carcinoma, with some enlarged Right axillary lymph nodes, the largest one of which was negative by biopsy. The tumor was grade 2, estrogen receptor positive at 88%, progesterone receptor positive at 11%, HER-2/neu positive by CISH with a ratio of 4.61, and a proliferation marker of 36%.    (2) treated in the neoadjuvant setting with Q 3 week docetaxel / carboplatin / trastuzumab  x6, completed 05/18/2011.  Continued on trastuzumab  every 3 weeks to complete one year, last dose in November 2013.   (3) s/p bilateral mastectomies with sentinel node biopsy 06/29/2011 for a residual right-sided ypT1c ypN0 invasive ductal carcinoma, grade 2, with immediate expander placement   (4) on tamoxifen as of May 2013, discontinued in early 2015 due to breast cancer recurrence    RECURRENT BREAST CANCER January 2015 (5) status post right lumpectomy with right-sided sentinel lymph node sampling 05/05/2013 for a pT1b cN0, stage IA invasive ductal carcinoma, estrogen receptor 94% positive, progesterone receptor 11% positive, with an MIB-1 of 19% and HER-2 amplification   (6)  Received TDM-1 every 3 weeks x 8 doses completed 10/09/2013; echo 08/25/2013 showed a well preserved ejection fraction   (7) adjuvant radiation therapy completed 01/08/2014    (8) started anastrozole  04/10/2014              (a) FSH and estradiol  in menopausal range 02/04/2014, to be followed every 3 months             (b) bone density 01/15/2013 showed a T score of -1.1             (c) switched to letrozole  07/07/2014, discontinued June 2016 because of arthralgias/myalgias             (d) started exemestane  September 2016, stopped March 2017 because of cost issues             (e) bone density 06/17/2015 showed a T score of -1.5              (f) anastrozole  resumed April 2017, discontinued by the patient sometime around April 2018   (9) genetic testing (23 genes associated with hereditary ovarian cancer through [employer], April 2015) 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  Updated as likely benign. There were no deleterious mutations noted    CURRENT THERAPY: observation  INTERVAL HISTORY:  Discussed the use of AI scribe software for clinical note transcription with the patient, who gave verbal consent to proceed.  History of Present Illness [name] is a 53 year old female with a history of breast cancer who presents for follow-up.  Her CA 27-29 levels have shown an upward trend over the last three tests, with the most recent being the highest, though still within normal range. She is considering the Garduant Reveal test for more reliable  monitoring and plans to have it drawn today.  She experiences breast pain and underarm issues, which she describes as reminiscent of previous experiences related to her breast cancer. She associates these symptoms with her CA 27-29 levels and is concerned she could possibly have a recurrence.  She underwent a hysteroscopy and D&C that revealed an endometrial polyp, followed by surgery in December to remove a large polyp and a smaller one. An MRI of her liver occurred in April and identified focal nodular hyperplasia, confirmed as benign hyperplasia.      Patient Active  Problem List   Diagnosis Date Noted   Abnormal ultrasound of endometrium 03/20/2023   Osteoporosis due to aromatase inhibitor 02/22/2023   Constipation 05/25/2022   Other fatigue 05/25/2022   Dizziness 05/25/2022   Endometrial thickening on ultrasound 05/24/2022   Abnormal laboratory test result 04/25/2022   Snoring 07/25/2021   Excessive daytime sleepiness 07/25/2021   Routine general medical examination at a health care facility 07/08/2021   Positive ANA (antinuclear antibody) 03/18/2021   Left leg pain 03/18/2021   Arthralgia 03/11/2021   Back pain 02/26/2021   Neck pain 02/26/2021   Gastroesophageal reflux disease 11/20/2019   Genetic testing 02/01/2018   Major depressive disorder, in remission 03/12/2017   Liver masses 12/06/2016   Recurrent cancer of right breast 12/22/2013   Headache 06/12/2013   Thrombocytopenia 05/22/2013   Malignant neoplasm of lower-inner quadrant of right breast of female, estrogen receptor positive 2012    is allergic to tussionex pennkinetic er [hydrocod poli-chlorphe poli er], other, morphine , morphine  and codeine , and penicillin g.  MEDICAL HISTORY: Past Medical History:  Diagnosis Date   Abdominal pain, epigastric 11/20/2019   Asthma    as a child   Cervical dysplasia    Gastroesophageal reflux disease 11/20/2019   Genetic testing 02/01/2018   Genetic testing was performed in 2015. The [employer] OvaNext panel was ordered. In total, 23 genes were analyzed as part of this panel: ATM, BARD1, BRCA1, BRCA2, BRIP1, CDH1, CHEK2, EPCAM, MLH1, MRE11A, MSH2, MSH6, MUTYH, NBN, NF1, PALB2, PMS2, PTEN, RAD50, RAD51C, RAD51D, STK11, and TP53. No pathogenic variants were detected. Genetic testing did detect a Variant of Unknown Significance (VUS) at the t   GERD (gastroesophageal reflux disease)    had during chemo   Headache 06/12/2013   History of chemotherapy    History of kidney stones    History of radiation therapy 11/24/13- 01/08/14   reconstructed  right breast/chest wall 4500 cGy 25 sessions, electron beam boost 1440 cGy 8 sessions   Liver masses 12/06/2016   Major depressive disorder, in remission 03/12/2017   Malignant neoplasm of lower-inner quadrant of right breast of female, estrogen receptor positive 2012   Osteoporosis    Pneumonia    PONV (postoperative nausea and vomiting)    Recurrent cancer of right breast 12/22/2013   Thrombocytopenia 05/22/2013   TMJ (dislocation of temporomandibular joint)     SURGICAL HISTORY: Past Surgical History:  Procedure Laterality Date   ADENOIDECTOMY     BREAST LUMPECTOMY WITH NEEDLE LOCALIZATION Right 05/05/2013   Procedure: EXCISION RECURRENT CANCER RIGHT BREAST WITH NEEDLE LOCALOZATION;  Surgeon: Elon CHRISTELLA Pacini, MD;  Location: [facility] OR;  Service: General;  Laterality: Right;   BREAST RECONSTRUCTION  06/29/2011   Procedure: BREAST RECONSTRUCTION;  Surgeon: Estefana Reichert, DO;  Location: [facility] OR;  Service: Plastics;  Laterality: Bilateral;  Immediate Bilateral Breast Reconstruction with Bilateral Tissue Expanders and placement of  Alloderm    DILATION AND CURETTAGE OF  UTERUS     fibroid tumor surgery     HYSTEROSCOPY WITH D & C N/A 03/20/2023   Procedure: DILATATION AND CURETTAGE /HYSTEROSCOPY;  Surgeon: Cleotilde Ronal RAMAN, MD;  Location: [name];  Service: Gynecology;  Laterality: N/A;   lazy eye     corrective surgery   MASTECTOMY W/ SENTINEL NODE BIOPSY  06/29/2011   Procedure: MASTECTOMY WITH SENTINEL LYMPH NODE BIOPSY;  Surgeon: Elon CHRISTELLA Pacini, MD;  Location: [facility] OR;  Service: General;  Laterality: Right;  right skin spairing mstectomy and right sentinel lymph node biopsy   PORT-A-CATH REMOVAL  03/12/2012   Procedure: MINOR REMOVAL PORT-A-CATH;  Surgeon: Elon CHRISTELLA Pacini, MD;  Location: [name];  Service: General;  Laterality: N/A;  Upper left   PORTACATH PLACEMENT Left 05/05/2013   Procedure: INSERTION PORT-A-CATH;  Surgeon: Elon CHRISTELLA Pacini, MD;  Location: [facility] OR;  Service:  General;  Laterality: Left;   RHINOPLASTY     uterine cervical treatments  2002   for precancerous lesions    SOCIAL HISTORY: Social History   Socioeconomic History   Marital status: Married    Spouse name: Not on file   Number of children: Not on file   Years of education: 10   Highest education level: GED or equivalent  Occupational History   Not on file  Tobacco Use   Smoking status: Former    Current packs/day: 0.00    Average packs/day: 1 pack/day for 4.0 years (4.0 ttl pk-yrs)    Types: Cigarettes    Start date: 71    Quit date: 2000    Years since quitting: 25.5    Passive exposure: Past   Smokeless tobacco: Never  Vaping Use   Vaping status: Never Used  Substance and Sexual Activity   Alcohol use: Not Currently    Alcohol/week: 0.0 standard drinks of alcohol   Drug use: No   Sexual activity: Yes    Partners: Male  Other Topics Concern   Not on file  Social History Narrative   She was adopted by her grandparents as a child.  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  She works in a Agricultural consultant.right handed   Two story home    Drinks caffeine occasional   Right handed   Social Drivers of Corporate investment banker Strain: Not on file  Food Insecurity: Low Risk  (07/18/2023)   Received from [facility]   Hunger Vital Sign    Within the past 12 months, you worried that your food would run out before you got money to buy more: Never true    Within the past 12 months, the food you bought just didn't last and you didn't have money to get more. : Never true  Transportation Needs: No Transportation Needs (07/18/2023)   Received from [facility]    In the past 12 months, has lack of reliable transportation kept you from medical appointments, meetings, work or from getting things needed for daily living? : No  Physical Activity: Not on file  Stress: Not on file  Social Connections: Unknown (08/22/2021)   Received  from [facility]   Social Network    Social Network: Not on file  Intimate Partner Violence: Unknown (07/14/2021)   Received from [facility]   HITS    Physically Hurt: Not on file    Insult or Talk Down To: Not on file    Threaten Physical Harm: Not  on file    Scream or Curse: Not on file    FAMILY HISTORY: Family History  Adopted: Yes  Problem Relation Age of Onset   Colon cancer Mother 7   Rectal cancer Mother    Cervical cancer Mother    Heart attack Father    Cancer Maternal Aunt 63       stomach and ovarian as separate primaries   Cancer Maternal Aunt 55       breast   Stomach cancer Maternal Aunt    Cancer Maternal Uncle 53       lung; smoker   Cancer Maternal Uncle 63       prostate   Cancer Maternal Grandmother 42       enodometrial   Cancer Cousin 40       melanoma; mat first cousin, located on neck    Review of Systems  Constitutional:  Negative for appetite change, chills, fatigue, fever and unexpected weight change.  HENT:   Negative for hearing loss, lump/mass and trouble swallowing.   Eyes:  Negative for eye problems and icterus.  Respiratory:  Negative for chest tightness, cough and shortness of breath.   Cardiovascular:  Negative for chest pain, leg swelling and palpitations.  Gastrointestinal:  Negative for abdominal distention, abdominal pain, constipation, diarrhea, nausea and vomiting.  Endocrine: Negative for hot flashes.  Genitourinary:  Negative for difficulty urinating.   Musculoskeletal:  Negative for arthralgias.  Skin:  Negative for itching and rash.  Neurological:  Negative for dizziness, extremity weakness, headaches and numbness.  Hematological:  Negative for adenopathy. Does not bruise/bleed easily.  Psychiatric/Behavioral:  Negative for depression. The patient is not nervous/anxious.       PHYSICAL EXAMINATION    Vitals:   10/09/23 1145  BP: 95/68  Pulse: 79  Resp: 17  Temp: 98.4 F (36.9 C)  SpO2: 100%    Physical  Exam Constitutional:      General: She is not in acute distress.    Appearance: Normal appearance. She is not toxic-appearing.  HENT:     Head: Normocephalic and atraumatic.     Mouth/Throat:     Mouth: Mucous membranes are moist.     Pharynx: Oropharynx is clear. No oropharyngeal exudate or posterior oropharyngeal erythema.  Eyes:     General: No scleral icterus. Cardiovascular:     Rate and Rhythm: Normal rate and regular rhythm.     Pulses: Normal pulses.     Heart sounds: Normal heart sounds.  Pulmonary:     Effort: Pulmonary effort is normal.     Breath sounds: Normal breath sounds.  Chest:     Comments: S/p bilateral mastectomies no sign of local recurrence Abdominal:     General: Abdomen is flat. Bowel sounds are normal. There is no distension.     Palpations: Abdomen is soft.     Tenderness: There is no abdominal tenderness.  Musculoskeletal:        General: No swelling.     Cervical back: Neck supple.  Lymphadenopathy:     Cervical: No cervical adenopathy.     Upper Body:     Right upper body: No supraclavicular or axillary adenopathy.     Left upper body: No supraclavicular or axillary adenopathy.  Skin:    General: Skin is warm and dry.     Findings: No rash.  Neurological:     General: No focal deficit present.     Mental Status: She is alert.  Psychiatric:  Mood and Affect: Mood normal.        Behavior: Behavior normal.     LABORATORY DATA:  CBC    Component Value Date/Time   WBC 5.8 09/26/2023 1303   WBC 6.2 03/20/2023 1124   RBC 4.17 09/26/2023 1303   HGB 12.9 09/26/2023 1303   HGB 13.4 02/23/2017 1356   HCT 37.7 09/26/2023 1303   HCT 40.9 02/23/2017 1356   PLT 267 09/26/2023 1303   PLT 259 02/23/2017 1356   PLT 249 08/02/2016 1437   MCV 90.4 09/26/2023 1303   MCV 92.8 02/23/2017 1356   MCH 30.9 09/26/2023 1303   MCHC 34.2 09/26/2023 1303   RDW 12.2 09/26/2023 1303   RDW 12.9 02/23/2017 1356   LYMPHSABS 1.6 09/26/2023 1303    LYMPHSABS 1.5 02/23/2017 1356   MONOABS 0.4 09/26/2023 1303   MONOABS 0.4 02/23/2017 1356   EOSABS 0.1 09/26/2023 1303   EOSABS 0.1 02/23/2017 1356   BASOSABS 0.0 09/26/2023 1303   BASOSABS 0.0 02/23/2017 1356    CMP     Component Value Date/Time   NA 141 09/26/2023 1303   NA 142 02/23/2017 1356   K 3.8 09/26/2023 1303   K 4.2 02/23/2017 1356   CL 105 09/26/2023 1303   CL 104 09/23/2012 1415   CO2 29 09/26/2023 1303   CO2 27 02/23/2017 1356   GLUCOSE 80 09/26/2023 1303   GLUCOSE 81 02/23/2017 1356   GLUCOSE 96 09/23/2012 1415   BUN 12 09/26/2023 1303   BUN 11.2 02/23/2017 1356   CREATININE 0.91 09/26/2023 1303   CREATININE 0.77 02/22/2023 1229   CREATININE 0.9 02/23/2017 1356   CALCIUM 9.8 09/26/2023 1303   CALCIUM 9.9 02/23/2017 1356   PROT 7.0 09/26/2023 1303   PROT 7.2 02/23/2017 1356   ALBUMIN 4.7 09/26/2023 1303   ALBUMIN 4.2 02/23/2017 1356   AST 19 09/26/2023 1303   AST 25 02/23/2017 1356   ALT 12 09/26/2023 1303   ALT 19 02/23/2017 1356   ALKPHOS 86 09/26/2023 1303   ALKPHOS 91 02/23/2017 1356   BILITOT 0.6 09/26/2023 1303   BILITOT 0.49 02/23/2017 1356   GFRNONAA >60 09/26/2023 1303   GFRAA >60 08/11/2019 1204     ASSESSMENT and THERAPY PLAN:   Assessment and Plan Assessment & Plan Breast cancer CA 27-29 levels fluctuating, but remain within normal range. Guardent Reveal test recommended for recurrence monitoring. Concerns about breast pain and underarm issues noted.  No signs of recurrence today.  - Guardant reveal testing every 6 months - Conduct full body scans if Gardent Reveal test is positive.  Hypercholesterolemia Cholesterol levels elevated, postmenopausal with family history of coronary artery disease. - Repeat cholesterol test during next lab check. - Recent CT cardiac score 0  RTC in 1 year for long-term f/u.      All questions were answered. The patient knows to call the clinic with any problems, questions or concerns. We can  certainly see the patient much sooner if necessary.  Total encounter time:45 minutes*in face-to-face visit time, chart review, lab review, care coordination, order entry, and documentation of the encounter time.    Morna Kendall, NP 10/09/23 11:49 AM Medical Oncology and Hematology [facility] [address] [place], [name] [address] Tel. [phone]    Fax. [phone]  *Total Encounter Time as defined by the Centers for Medicare and Medicaid Services includes, in addition to the face-to-face time of a patient visit (documented in the note above) non-face-to-face time: obtaining and reviewing  outside history, ordering and reviewing medications, tests or procedures, care coordination (communications with other health care professionals or caregivers) and documentation in the medical record.

## 2023-10-10 ENCOUNTER — Encounter: Payer: Self-pay | Admitting: Adult Health

## 2023-10-29 ENCOUNTER — Ambulatory Visit (INDEPENDENT_AMBULATORY_CARE_PROVIDER_SITE_OTHER): Payer: No Typology Code available for payment source | Admitting: Obstetrics & Gynecology

## 2023-10-29 ENCOUNTER — Other Ambulatory Visit (HOSPITAL_COMMUNITY)
Admission: RE | Admit: 2023-10-29 | Discharge: 2023-10-29 | Disposition: A | Discharge status: Home / Self Care | Source: Ambulatory Visit | Attending: Obstetrics & Gynecology | Admitting: Obstetrics & Gynecology

## 2023-10-29 ENCOUNTER — Encounter (HOSPITAL_BASED_OUTPATIENT_CLINIC_OR_DEPARTMENT_OTHER): Payer: Self-pay | Admitting: Obstetrics & Gynecology

## 2023-10-29 VITALS — BP 96/75 | HR 68 | Wt 125.0 lb

## 2023-10-29 DIAGNOSIS — Z124 Encounter for screening for malignant neoplasm of cervix: Secondary | ICD-10-CM | POA: Diagnosis present

## 2023-10-29 DIAGNOSIS — M818 Other osteoporosis without current pathological fracture: Secondary | ICD-10-CM

## 2023-10-29 DIAGNOSIS — Z01419 Encounter for gynecological examination (general) (routine) without abnormal findings: Secondary | ICD-10-CM

## 2023-10-29 DIAGNOSIS — N840 Polyp of corpus uteri: Secondary | ICD-10-CM

## 2023-10-29 DIAGNOSIS — Z79899 Other long term (current) drug therapy: Secondary | ICD-10-CM | POA: Diagnosis not present

## 2023-10-29 DIAGNOSIS — C50311 Malignant neoplasm of lower-inner quadrant of right female breast: Secondary | ICD-10-CM | POA: Diagnosis not present

## 2023-10-29 DIAGNOSIS — Z170 Estrogen receptor positive status [ER+]: Secondary | ICD-10-CM

## 2023-10-29 NOTE — Progress Notes (Unsigned)
 ANNUAL EXAM Patient name: [name] MRN [id number]  Date of birth: 1970-07-10 Chief Complaint:   Gynecologic Exam (Has had intermittent pain in the legs and pelvic area that started a few months ago, more like a dull ache, prior treatment has been OTC meds )  History of Present Illness:   [name] is a 53 y.o. G7P1021 Caucasian female being seen today for a routine annual exam.  Denies vaginal bleeding.  Saw Morna Kendall with oncology recently.  H/O breast cancer in 2012 with recurrence in 2015.  She is waiting for her Guardant Reveal test.  Should get results today.    Husband recently had enlarged lymph node that was related to   Denies vaginal bleeding.    Patient's last menstrual period was 03/11/2011.  Last pap 05/02/2022. Results were: negative with negative HR HPV. H/O abnormal pap: yes h/o conization in 2002 Last mammogram: not indicated due to bilateral mastectomy in 2013. Last colonoscopy: 02/22/2022. Results were: normal. Family h/o colorectal cancer: yes .  Follow up colonoscopy 5 years due to family hx.   DEXA:  2024.  T score -2.7.  Has seen rheumatologist for this.       10/29/2023    2:28 PM 10/09/2023   11:45 AM 04/19/2023    2:26 PM 01/30/2023   10:47 AM 05/02/2022    1:29 PM  Depression screen PHQ 2/9  Decreased Interest 0 0 0 0 1  Down, Depressed, Hopeless 1 0 0 0 0  PHQ - 2 Score 1 0 0 0 1  Altered sleeping     1  Tired, decreased energy     1  Change in appetite     1  Feeling bad or failure about yourself      0  Trouble concentrating     1  Moving slowly or fidgety/restless     0  Suicidal thoughts     0  PHQ-9 Score     5  Difficult doing work/chores     Not difficult at all    Review of Systems:   Pertinent items are noted in HPI Denies any vaginal bleeding, urinary or bowel changes, or pelvic pain. Pertinent History Reviewed:  Reviewed past medical,surgical, social and family history.  Reviewed problem list, medications and  allergies. Physical Assessment:   Vitals:   10/29/23 1427  BP: 96/75  Pulse: 68  SpO2: 100%  Weight: 125 lb (56.7 kg)  Body mass index is 22.14 kg/m.        Physical Examination:   General appearance - well appearing, and in no distress  Mental status - alert, oriented to person, place, and time  Psych:  She has a normal mood and affect  Skin - warm and dry, normal color, no suspicious lesions noted  Chest - effort normal, all lung fields clear to auscultation bilaterally  Heart - normal rate and regular rhythm  Neck:  midline trachea, no thyromegaly or nodules  Breasts - breasts appear normal, no suspicious masses, no skin or nipple changes or  axillary nodes  Abdomen - soft, nontender, nondistended, no masses or organomegaly  Pelvic - VULVA: normal appearing vulva with no masses, tenderness or lesions   VAGINA: normal appearing vagina with normal color and discharge, no lesions   CERVIX: normal appearing cervix without discharge or lesions, no CMT  Thin prep pap is done with HR HPV  UTERUS: uterus is felt to be normal size, shape, consistency and nontender  ADNEXA: No adnexal masses or tenderness noted.  Rectal - normal rectal, good sphincter tone, no masses felt.   Extremities:  No swelling or varicosities noted  Chaperone present for exam  No results found. However, due to the size of the patient record, not all encounters were searched. Please check Results Review for a complete set of results.  Assessment & Plan:    No orders of the defined types were placed in this encounter.   Meds: No orders of the defined types were placed in this encounter.   Follow-up: No follow-ups on file.  Ronal GORMAN Pinal, MD 10/29/2023 2:56 PM

## 2023-11-01 ENCOUNTER — Encounter (HOSPITAL_BASED_OUTPATIENT_CLINIC_OR_DEPARTMENT_OTHER): Payer: Self-pay | Admitting: Obstetrics & Gynecology

## 2023-11-02 LAB — CYTOLOGY - PAP
Comment: NEGATIVE
Diagnosis: NEGATIVE
High risk HPV: NEGATIVE

## 2023-11-07 ENCOUNTER — Ambulatory Visit (HOSPITAL_BASED_OUTPATIENT_CLINIC_OR_DEPARTMENT_OTHER): Payer: Self-pay | Admitting: Certified Nurse Midwife

## 2023-11-12 ENCOUNTER — Encounter (HOSPITAL_BASED_OUTPATIENT_CLINIC_OR_DEPARTMENT_OTHER): Payer: No Typology Code available for payment source | Admitting: Obstetrics & Gynecology

## 2023-11-19 ENCOUNTER — Other Ambulatory Visit: Payer: Self-pay | Admitting: *Deleted

## 2023-11-19 ENCOUNTER — Inpatient Hospital Stay: Attending: Adult Health

## 2023-11-19 DIAGNOSIS — C50311 Malignant neoplasm of lower-inner quadrant of right female breast: Secondary | ICD-10-CM

## 2023-11-19 DIAGNOSIS — Z170 Estrogen receptor positive status [ER+]: Secondary | ICD-10-CM

## 2023-11-26 NOTE — Telephone Encounter (Signed)
 Phone call to [name] with negative results of guardant reveal

## 2023-11-28 ENCOUNTER — Encounter: Payer: Self-pay | Admitting: Adult Health

## 2023-11-29 LAB — GUARDANT REVEAL

## 2023-12-12 NOTE — Progress Notes (Deleted)
 Office Visit Note  Patient: [name]             Date of Birth: 1971/03/09           MRN: [id number]             PCP: [name], MD Referring: [name], * Visit Date: 12/26/2023   Subjective:  No chief complaint on file.   History of Present Illness: [name] is a 53 y.o. female here for follow up for joint pain and fatigue with persistent positive ANA.   Previous HPI 02/22/2023 [name] is a 53 y.o. female here for follow up for joint pain and fatigue with persistent positive ANA.  She has not had any major new acute illness or complications.  Still experiencing leg swelling with sensitivity at affected areas.  Skin is dry all over breaks out with plaques and patches on the face for which she is prescribed topical steroid from dermatology.  In September she had updated bone density scan checked consistent with osteoporosis.  Has history of breast cancer including tamoxifen treatment.  No history of fractures.  She has been taking a vitamin D  supplement with last level of 9 in July.   Previous HPI 08/24/2022 [name] is a 53 y.o. female here for follow up for joint pain and fatigue with positive ANA no specific connective tissue disease identified at evaluation so far.  Since our last visit she had reassuring pathology results for the thickened endometrial biopsy.  Subsequently followed with gastroenterology she had additional abdominal imaging with the finding of multiple liver cysts with labs consistently showing normal liver function.  She does have constipation does not have severe abdominal pain.  Does have some dysuria but does not notice any blood or discoloration or odor.  Ongoing back pain positive for both lying at rest and sometimes up and moving no radiation or numbness in the legs.   Previous HPI 05/25/22 [name] is a 53 y.o. female here for follow up for joint pain and fatigue with positive ANA testing.  Lab  workup at our initial visit was entirely negative for specific antibodies but showed ANA consistent positive at a higher titer than before.  Also had normal thyroid  function.  Since that visit she has had some additional workup with abdominal MRI due to worsened bloating abdominal discomfort and unintentional weight loss.  Also had endometrial biopsy yesterday due to thickening and cystic changes noted on transvaginal ultrasound exam.  Besides these she continues having aches and pains most prominently throughout her legs on a daily basis.  Does see distal leg swelling usually by the end of the day without any overlying skin rash or discoloration.  Ongoing eye redness and irritation and feels dry most of the time, she thinks there may be some discoloration but no new changes in visual acuity.  Still has excessive fatigue regardless of sleep pattern.  She is experiencing periods of dizziness often provoked with time spent outdoors but sometimes just with positional change during no particular activity.     Previous HPI 04/25/22 [name] is a 53 y.o. female here for evaluation of positive ANA checked in association with ongoing symptoms including joint pain and fatigue most predominantly.  She has had some chronic issues with back pain and multiple levels attributed to some degenerative joint disease in the spine that is more longstanding.  However for about the past 1 year she has increase  in symptoms and also experiencing some more pain in other areas her extremities and in bilateral hips.  There is more tenderness to pressure in the distal legs on both sides below the level of the knee.  She notices some possible swelling involving lower extremities but none localized to specific joint areas.  She does not notice any particular erythema or warmth in affected areas.  She tends to notice worsening after period of increased activity or exertion taking 1 or 2 days to recover from fatigue and aches.  She was  taking Excedrin which was partially helpful for symptoms but stopped due to irritation with upper abdominal discomfort. Outside of joint problems she experiences persistent fatigue with a feeling of very low energy level.  She does not notice any particular sleep disruption and is not sleepy or taking naps during the daytime.   She has chronic gastrointestinal symptoms with pretty severe constipation predominant irritable bowel symptoms.  She also has some history of GERD without known upper GI complication.  She has had multiple liver masses present on CT imaging but with no high risk features identified and negative for increased metabolic activity on PET scan.   She reports generalized alopecia with some hair thinning or breaking without any focal bald spots or rash or scarring on the scalp.  Skin sensitivity to light with occasional erythema but no persistent rashes.  She has some chronic dryness affecting eyes and mouth no history of oral or nasal ulcers.  No persistent lymphadenopathy.  She denies typical Raynaud's symptoms with no fingernail changes.   Breast cancer Hx [name] is a 53 year old woman with h/o ER positive breast cancer diagnosed in 2012 s/p bilateral mastectomies, chemotherapy, and tamoxifen. She developed recurrence in 2015, stage IA triple positive breast cancer s/p TDM1, adjuvant radiation, and unable to tolerate long term antiestrogen therapy.    Labs reviewed 02/2021 ANA 1:40 homogenous dsDNA, Sm, SSA, SSB, Scl-70 Abs neg RF neg   Imaging reviewed 01/20/21 MRI Lumbar spine Disc levels: T12-L1: Negative. L1-2: Negative. L2-3: Negative. L3-4: A rightward disc protrusion is present. Mild facet hypertrophy is noted bilaterally. Mild right foraminal narrowing is new. The central canal and left foramen are patent. L4-5: A broad-based disc protrusion is asymmetric to the right. Mild facet hypertrophy and ligamentum flavum thickening are noted. Moderate right subarticular  narrowing is present. Progressive moderate foraminal narrowing is noted bilaterally, right greater than left. L5-S1: Mild facet hypertrophy is noted bilaterally. No significant disc protrusion or stenosis is present.   03/21/21 Xray left knee Trace patellofemoral spurring. Otherwise unremarkable radiographic appearance.   No Rheumatology ROS completed.   PMFS History:  Patient Active Problem List   Diagnosis Date Noted   Osteoporosis due to aromatase inhibitor 02/22/2023   Constipation 05/25/2022   Other fatigue 05/25/2022   Dizziness 05/25/2022   Snoring 07/25/2021   Excessive daytime sleepiness 07/25/2021   Routine general medical examination at a health care facility 07/08/2021   Positive ANA (antinuclear antibody) 03/18/2021   Left leg pain 03/18/2021   Arthralgia 03/11/2021   Back pain 02/26/2021   Neck pain 02/26/2021   Gastroesophageal reflux disease 11/20/2019   Genetic testing 02/01/2018   Major depressive disorder, in remission 03/12/2017   Liver masses 12/06/2016   Recurrent cancer of right breast 12/22/2013   Headache 06/12/2013   Thrombocytopenia 05/22/2013   Malignant neoplasm of lower-inner quadrant of right breast of female, estrogen receptor positive 2012    Past Medical History:  Diagnosis Date  Abdominal pain, epigastric 11/20/2019   Asthma    as a child   Cervical dysplasia    Gastroesophageal reflux disease 11/20/2019   Genetic testing 02/01/2018   Genetic testing was performed in 2015. The [facility] OvaNext panel was ordered. In total, 23 genes were analyzed as part of this panel: ATM, BARD1, BRCA1, BRCA2, BRIP1, CDH1, CHEK2, EPCAM, MLH1, MRE11A, MSH2, MSH6, MUTYH, NBN, NF1, PALB2, PMS2, PTEN, RAD50, RAD51C, RAD51D, STK11, and TP53. No pathogenic variants were detected. Genetic testing did detect a Variant of Unknown Significance (VUS) at the t   GERD (gastroesophageal reflux disease)    had during chemo   Headache 06/12/2013   History of chemotherapy     History of kidney stones    History of radiation therapy 11/24/13- 01/08/14   reconstructed right breast/chest wall 4500 cGy 25 sessions, electron beam boost 1440 cGy 8 sessions   Liver masses 12/06/2016   Major depressive disorder, in remission 03/12/2017   Malignant neoplasm of lower-inner quadrant of right breast of female, estrogen receptor positive 2012   Osteoporosis    Pneumonia    PONV (postoperative nausea and vomiting)    Recurrent cancer of right breast 12/22/2013   Thrombocytopenia 05/22/2013   TMJ (dislocation of temporomandibular joint)     Family History  Adopted: Yes  Problem Relation Age of Onset   Colon cancer Mother 18   Rectal cancer Mother    Cervical cancer Mother    Heart attack Father    CAD Father    Cancer Maternal Aunt 60       stomach and ovarian as separate primaries   Cancer Maternal Aunt 55       breast   Stomach cancer Maternal Aunt    Cancer Maternal Uncle 69       lung; smoker   Cancer Maternal Uncle 63       prostate   Cancer Maternal Grandmother 36       enodometrial   Cancer Cousin 40       melanoma; mat first cousin, located on neck   Past Surgical History:  Procedure Laterality Date   ADENOIDECTOMY     BREAST LUMPECTOMY WITH NEEDLE LOCALIZATION Right 05/05/2013   Procedure: EXCISION RECURRENT CANCER RIGHT BREAST WITH NEEDLE LOCALOZATION;  Surgeon: Elon CHRISTELLA Pacini, MD;  Location: [facility] OR;  Service: General;  Laterality: Right;   BREAST RECONSTRUCTION  06/29/2011   Procedure: BREAST RECONSTRUCTION;  Surgeon: Estefana Reichert, DO;  Location: [facility] OR;  Service: Plastics;  Laterality: Bilateral;  Immediate Bilateral Breast Reconstruction with Bilateral Tissue Expanders and placement of  Alloderm    DILATION AND CURETTAGE OF UTERUS     fibroid tumor surgery     HYSTEROSCOPY WITH D & C N/A 03/20/2023   Procedure: DILATATION AND CURETTAGE /HYSTEROSCOPY;  Surgeon: Cleotilde Ronal RAMAN, MD;  Location: [facility] OR;  Service: Gynecology;  Laterality: N/A;    lazy eye     corrective surgery   MASTECTOMY W/ SENTINEL NODE BIOPSY  06/29/2011   Procedure: MASTECTOMY WITH SENTINEL LYMPH NODE BIOPSY;  Surgeon: Elon CHRISTELLA Pacini, MD;  Location: [facility] OR;  Service: General;  Laterality: Right;  right skin spairing mstectomy and right sentinel lymph node biopsy   PORT-A-CATH REMOVAL  03/12/2012   Procedure: MINOR REMOVAL PORT-A-CATH;  Surgeon: Elon CHRISTELLA Pacini, MD;  Location: [facility];  Service: General;  Laterality: N/A;  Upper left   PORTACATH PLACEMENT Left 05/05/2013   Procedure: INSERTION PORT-A-CATH;  Surgeon: Elon CHRISTELLA Pacini, MD;  Location: [facility] OR;  Service: General;  Laterality: Left;   RHINOPLASTY     uterine cervical treatments  04/10/2000   for precancerous lesions   Social History   Social History Narrative   She was adopted by her grandparents as a child.  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  She works in a Agricultural consultant.right handed   Two story home    Drinks caffeine occasional   Right handed    There is no immunization history on file for this patient.   Objective: Vital Signs: LMP 03/11/2011    Physical Exam   Musculoskeletal Exam: ***  CDAI Exam: CDAI Score: -- Patient Global: --; Provider Global: -- Swollen: --; Tender: -- Joint Exam 12/26/2023   No joint exam has been documented for this visit   There is currently no information documented on the homunculus. Go to the Rheumatology activity and complete the homunculus joint exam.  Investigation: No additional findings.  Imaging: No results found.  Recent Labs: Lab Results  Component Value Date   WBC 5.8 09/26/2023   HGB 12.9 09/26/2023   PLT 267 09/26/2023   NA 141 09/26/2023   K 3.8 09/26/2023   CL 105 09/26/2023   CO2 29 09/26/2023   GLUCOSE 80 09/26/2023   BUN 12 09/26/2023   CREATININE 0.91 09/26/2023   BILITOT 0.6 09/26/2023   ALKPHOS 86 09/26/2023   AST 19 09/26/2023   ALT 12 09/26/2023   PROT 7.0  09/26/2023   ALBUMIN 4.7 09/26/2023   CALCIUM 9.8 09/26/2023   GFRAA >60 08/11/2019    Speciality Comments: No specialty comments available.  Procedures:  No procedures performed Allergies: Tussionex pennkinetic er [hydrocod poli-chlorphe poli er], Other, Morphine , Morphine  and codeine , and Penicillin g   Assessment / Plan:     Visit Diagnoses: No diagnosis found.  ***  Orders: No orders of the defined types were placed in this encounter.  No orders of the defined types were placed in this encounter.    Follow-Up Instructions: No follow-ups on file.   Pegah Segel M Hanifa Antonetti, CMA  Note - This record has been created using Animal nutritionist.  Chart creation errors have been sought, but may not always  have been located. Such creation errors do not reflect on  the standard of medical care.

## 2023-12-26 ENCOUNTER — Ambulatory Visit: Payer: Self-pay | Admitting: Internal Medicine

## 2023-12-26 DIAGNOSIS — R768 Other specified abnormal immunological findings in serum: Secondary | ICD-10-CM

## 2023-12-26 DIAGNOSIS — K5900 Constipation, unspecified: Secondary | ICD-10-CM

## 2023-12-26 DIAGNOSIS — M818 Other osteoporosis without current pathological fracture: Secondary | ICD-10-CM

## 2023-12-26 DIAGNOSIS — G8929 Other chronic pain: Secondary | ICD-10-CM

## 2024-01-02 ENCOUNTER — Encounter: Admitting: Physician Assistant

## 2024-01-14 ENCOUNTER — Encounter: Admitting: Physician Assistant

## 2024-01-16 ENCOUNTER — Encounter: Payer: Self-pay | Admitting: Adult Health

## 2024-01-21 ENCOUNTER — Telehealth: Payer: Self-pay

## 2024-01-21 NOTE — Telephone Encounter (Signed)
 Bills received from pt. Will contact billing for a better clarification.

## 2024-01-24 ENCOUNTER — Encounter: Payer: Self-pay | Admitting: Physician Assistant

## 2024-01-24 ENCOUNTER — Ambulatory Visit: Admitting: Physician Assistant

## 2024-01-24 VITALS — Ht 64.0 in | Wt 125.0 lb

## 2024-01-24 DIAGNOSIS — M810 Age-related osteoporosis without current pathological fracture: Secondary | ICD-10-CM

## 2024-01-24 MED ORDER — ROMOSOZUMAB-AQQG 105 MG/1.17ML ~~LOC~~ SOSY: 210.0000 mg | PREFILLED_SYRINGE | Freq: Once | SUBCUTANEOUS | Status: AC

## 2024-01-24 NOTE — Addendum Note (Signed)
 Addended by: LYNNANN HARVEY E on: 01/24/2024 02:50 PM   Modules accepted: Orders

## 2024-01-24 NOTE — Progress Notes (Signed)
 Office Visit Note   Patient: [name]           Date of Birth: [birth year]           MRN: [id number] Visit Date: 01/24/2024              Requested by: Cleotilde Ronal RAMAN, MD [address] [place],  [name] [address] PCP: [name], MD   Assessment & Plan: Visit Diagnoses: Osteoporosis  Plan: Patient is a 53 year old woman who is referred by Dr. Cleotilde.  She comes in today for evaluation of osteoporosis.  She does not take any medication currently for osteoporosis.  She has never had a fracture of her hip spine or wrist.  She has no history of heart disease stroke or heart attack.  She does have a history of breast cancer and premelanoma.  And cervical cancer.  No history of kidney disease.  She does have a history of an ulcer no history of bypass.  She does have significant reflux.  She did go through chemo induced menopause when she was 28.  She says she takes 600 mg of calcium a day but not consistently and she currently takes 5000 international units of vitamin D .  She did not do any hormone replacement therapy is a former smoker having quit 20+ years ago.  She does not consume alcoholic beverages she tries to stay active daily she has had no major dental work and no family history of fragility fracture.  She also has some autoimmune issues which she is currently working on.  Most recent bone density was -2.7 we did discuss tracking her calcium and the importance of calcium and vitamin D .  She said she did have a vitamin D  recently that was normal.  We also talked about incorporating resistance training into her activity twice a week.  She inquired about a bone sit therapist through [facility] I told her I look into this.  I do think given her young age and her bone density score she should consider Evenity.  She thinks this is what was recommended to her in the past.  Will work on authorization.  I spent 45 minutes with her discussing medications she is concerned about  some of the side effects with regards to atypical femur fracture I did tell her this was a very low risk.  She also concerned about avascular necrosis of the jaw once again with Evenity this is not really a risk.  Will try and authorize for Evenity  Follow-Up Instructions: Return if symptoms worsen or fail to improve.   Orders:  No orders of the defined types were placed in this encounter.  No orders of the defined types were placed in this encounter.     Procedures: No procedures performed   Clinical Data: No additional findings.   Subjective: No chief complaint on file.   HPI patient is a pleasant 53 year old woman being referred by Dr. Cleotilde for evaluation of osteoporosis  Review of Systems  All other systems reviewed and are negative.    Objective: Vital Signs: Ht 5' 4 (1.626 m)   Wt 125 lb (56.7 kg)   LMP 03/11/2011   BMI 21.46 kg/m   Physical Exam Constitutional:      Appearance: Normal appearance.  Pulmonary:     Effort: Pulmonary effort is normal.  Skin:    General: Skin is warm and dry.  Neurological:     General: No focal deficit present.  Mental Status: She is alert and oriented to person, place, and time.  Psychiatric:        Mood and Affect: Mood normal.        Behavior: Behavior normal.       Specialty Comments:  No specialty comments available.  Imaging: No results found.   PMFS History: Patient Active Problem List   Diagnosis Date Noted   Age-related osteoporosis without current pathological fracture 01/24/2024   Osteoporosis due to aromatase inhibitor 02/22/2023   Constipation 05/25/2022   Other fatigue 05/25/2022   Dizziness 05/25/2022   Snoring 07/25/2021   Excessive daytime sleepiness 07/25/2021   Routine general medical examination at a health care facility 07/08/2021   Positive ANA (antinuclear antibody) 03/18/2021   Left leg pain 03/18/2021   Arthralgia 03/11/2021   Back pain 02/26/2021   Neck pain 02/26/2021    Gastroesophageal reflux disease 11/20/2019   Genetic testing 02/01/2018   Major depressive disorder, in remission 03/12/2017   Liver masses 12/06/2016   Recurrent cancer of right breast 12/22/2013   Headache 06/12/2013   Thrombocytopenia 05/22/2013   Malignant neoplasm of lower-inner quadrant of right breast of female, estrogen receptor positive 2012   Past Medical History:  Diagnosis Date   Abdominal pain, epigastric 11/20/2019   Asthma    as a child   Cervical dysplasia    Gastroesophageal reflux disease 11/20/2019   Genetic testing 02/01/2018   Genetic testing was performed in 2015. The [employer] OvaNext panel was ordered. In total, 23 genes were analyzed as part of this panel: ATM, BARD1, BRCA1, BRCA2, BRIP1, CDH1, CHEK2, EPCAM, MLH1, MRE11A, MSH2, MSH6, MUTYH, NBN, NF1, PALB2, PMS2, PTEN, RAD50, RAD51C, RAD51D, STK11, and TP53. No pathogenic variants were detected. Genetic testing did detect a Variant of Unknown Significance (VUS) at the t   GERD (gastroesophageal reflux disease)    had during chemo   Headache 06/12/2013   History of chemotherapy    History of kidney stones    History of radiation therapy 11/24/13- 01/08/14   reconstructed right breast/chest wall 4500 cGy 25 sessions, electron beam boost 1440 cGy 8 sessions   Liver masses 12/06/2016   Major depressive disorder, in remission 03/12/2017   Malignant neoplasm of lower-inner quadrant of right breast of female, estrogen receptor positive 2012   Osteoporosis    Pneumonia    PONV (postoperative nausea and vomiting)    Recurrent cancer of right breast 12/22/2013   Thrombocytopenia 05/22/2013   TMJ (dislocation of temporomandibular joint)     Family History  Adopted: Yes  Problem Relation Age of Onset   Colon cancer Mother 22   Rectal cancer Mother    Cervical cancer Mother    Heart attack Father    CAD Father    Cancer Maternal Aunt 60       stomach and ovarian as separate primaries   Cancer Maternal Aunt 55        breast   Stomach cancer Maternal Aunt    Cancer Maternal Uncle 35       lung; smoker   Cancer Maternal Uncle 63       prostate   Cancer Maternal Grandmother 72       enodometrial   Cancer Cousin 40       melanoma; mat first cousin, located on neck    Past Surgical History:  Procedure Laterality Date   ADENOIDECTOMY     BREAST LUMPECTOMY WITH NEEDLE LOCALIZATION Right 05/05/2013   Procedure: EXCISION RECURRENT CANCER RIGHT BREAST  WITH NEEDLE LOCALOZATION;  Surgeon: Elon CHRISTELLA Pacini, MD;  Location: [facility] OR;  Service: General;  Laterality: Right;   BREAST RECONSTRUCTION  06/29/2011   Procedure: BREAST RECONSTRUCTION;  Surgeon: Estefana Reichert, DO;  Location: [facility] OR;  Service: Plastics;  Laterality: Bilateral;  Immediate Bilateral Breast Reconstruction with Bilateral Tissue Expanders and placement of  Alloderm    DILATION AND CURETTAGE OF UTERUS     fibroid tumor surgery     HYSTEROSCOPY WITH D & C N/A 03/20/2023   Procedure: DILATATION AND CURETTAGE /HYSTEROSCOPY;  Surgeon: Cleotilde Ronal RAMAN, MD;  Location: [facility] OR;  Service: Gynecology;  Laterality: N/A;   lazy eye     corrective surgery   MASTECTOMY W/ SENTINEL NODE BIOPSY  06/29/2011   Procedure: MASTECTOMY WITH SENTINEL LYMPH NODE BIOPSY;  Surgeon: Elon CHRISTELLA Pacini, MD;  Location: [facility] OR;  Service: General;  Laterality: Right;  right skin spairing mstectomy and right sentinel lymph node biopsy   PORT-A-CATH REMOVAL  03/12/2012   Procedure: MINOR REMOVAL PORT-A-CATH;  Surgeon: Elon CHRISTELLA Pacini, MD;  Location: [facility];  Service: General;  Laterality: N/A;  Upper left   PORTACATH PLACEMENT Left 05/05/2013   Procedure: INSERTION PORT-A-CATH;  Surgeon: Elon CHRISTELLA Pacini, MD;  Location: [facility] OR;  Service: General;  Laterality: Left;   RHINOPLASTY     uterine cervical treatments  04/10/2000   for precancerous lesions   Social History   Occupational History   Not on file  Tobacco Use   Smoking status: Former    Current packs/day:  0.00    Average packs/day: 1 pack/day for 4.0 years (4.0 ttl pk-yrs)    Types: Cigarettes    Start date: 15    Quit date: 2000    Years since quitting: 25.8    Passive exposure: Past   Smokeless tobacco: Never  Vaping Use   Vaping status: Never Used  Substance and Sexual Activity   Alcohol use: Not Currently    Alcohol/week: 0.0 standard drinks of alcohol   Drug use: No   Sexual activity: Yes    Partners: Male

## 2024-02-08 ENCOUNTER — Other Ambulatory Visit: Payer: Self-pay | Admitting: Radiology

## 2024-02-08 DIAGNOSIS — M810 Age-related osteoporosis without current pathological fracture: Secondary | ICD-10-CM

## 2024-02-08 MED ORDER — ROMOSOZUMAB-AQQG 105 MG/1.17ML ~~LOC~~ SOSY: 210.0000 mg | PREFILLED_SYRINGE | Freq: Once | SUBCUTANEOUS | Status: AC

## 2024-02-08 NOTE — Progress Notes (Signed)
 Evenity Rx sent in to specialty pharmacy.

## 2024-02-11 ENCOUNTER — Encounter: Payer: Self-pay | Admitting: Radiology

## 2024-02-18 NOTE — Progress Notes (Deleted)
 Office Visit Note  Patient: [name]             Date of Birth: 05-09-1970           MRN: [id number]             PCP: [name], MD Referring: [name], * Visit Date: 02/27/2024   Subjective:  No chief complaint on file.   History of Present Illness: [name] is a 53 y.o. female here for follow up for joint pain and fatigue with persistent positive ANA.    Previous HPI 02/22/2023 [name] is a 53 y.o. female here for follow up for joint pain and fatigue with persistent positive ANA.  She has not had any major new acute illness or complications.  Still experiencing leg swelling with sensitivity at affected areas.  Skin is dry all over breaks out with plaques and patches on the face for which she is prescribed topical steroid from dermatology.  In September she had updated bone density scan checked consistent with osteoporosis.  Has history of breast cancer including tamoxifen treatment.  No history of fractures.  She has been taking a vitamin D  supplement with last level of 9 in July.   Previous HPI 08/24/2022 [name] is a 53 y.o. female here for follow up for joint pain and fatigue with positive ANA no specific connective tissue disease identified at evaluation so far.  Since our last visit she had reassuring pathology results for the thickened endometrial biopsy.  Subsequently followed with gastroenterology she had additional abdominal imaging with the finding of multiple liver cysts with labs consistently showing normal liver function.  She does have constipation does not have severe abdominal pain.  Does have some dysuria but does not notice any blood or discoloration or odor.  Ongoing back pain positive for both lying at rest and sometimes up and moving no radiation or numbness in the legs.   Previous HPI 05/25/22 [name] is a 53 y.o. female here for follow up for joint pain and fatigue with positive ANA testing.  Lab  workup at our initial visit was entirely negative for specific antibodies but showed ANA consistent positive at a higher titer than before.  Also had normal thyroid  function.  Since that visit she has had some additional workup with abdominal MRI due to worsened bloating abdominal discomfort and unintentional weight loss.  Also had endometrial biopsy yesterday due to thickening and cystic changes noted on transvaginal ultrasound exam.  Besides these she continues having aches and pains most prominently throughout her legs on a daily basis.  Does see distal leg swelling usually by the end of the day without any overlying skin rash or discoloration.  Ongoing eye redness and irritation and feels dry most of the time, she thinks there may be some discoloration but no new changes in visual acuity.  Still has excessive fatigue regardless of sleep pattern.  She is experiencing periods of dizziness often provoked with time spent outdoors but sometimes just with positional change during no particular activity.     Previous HPI 04/25/22 [name] is a 53 y.o. female here for evaluation of positive ANA checked in association with ongoing symptoms including joint pain and fatigue most predominantly.  She has had some chronic issues with back pain and multiple levels attributed to some degenerative joint disease in the spine that is more longstanding.  However for about the past 1 year she has  increase in symptoms and also experiencing some more pain in other areas her extremities and in bilateral hips.  There is more tenderness to pressure in the distal legs on both sides below the level of the knee.  She notices some possible swelling involving lower extremities but none localized to specific joint areas.  She does not notice any particular erythema or warmth in affected areas.  She tends to notice worsening after period of increased activity or exertion taking 1 or 2 days to recover from fatigue and aches.  She was  taking Excedrin which was partially helpful for symptoms but stopped due to irritation with upper abdominal discomfort. Outside of joint problems she experiences persistent fatigue with a feeling of very low energy level.  She does not notice any particular sleep disruption and is not sleepy or taking naps during the daytime.   She has chronic gastrointestinal symptoms with pretty severe constipation predominant irritable bowel symptoms.  She also has some history of GERD without known upper GI complication.  She has had multiple liver masses present on CT imaging but with no high risk features identified and negative for increased metabolic activity on PET scan.   She reports generalized alopecia with some hair thinning or breaking without any focal bald spots or rash or scarring on the scalp.  Skin sensitivity to light with occasional erythema but no persistent rashes.  She has some chronic dryness affecting eyes and mouth no history of oral or nasal ulcers.  No persistent lymphadenopathy.  She denies typical Raynaud's symptoms with no fingernail changes.   Breast cancer Hx [name] is a 53 year old woman with h/o ER positive breast cancer diagnosed in 2012 s/p bilateral mastectomies, chemotherapy, and tamoxifen. She developed recurrence in 2015, stage IA triple positive breast cancer s/p TDM1, adjuvant radiation, and unable to tolerate long term antiestrogen therapy.    Labs reviewed 02/2021 ANA 1:40 homogenous dsDNA, Sm, SSA, SSB, Scl-70 Abs neg RF neg   Imaging reviewed 01/20/21 MRI Lumbar spine Disc levels: T12-L1: Negative. L1-2: Negative. L2-3: Negative. L3-4: A rightward disc protrusion is present. Mild facet hypertrophy is noted bilaterally. Mild right foraminal narrowing is new. The central canal and left foramen are patent. L4-5: A broad-based disc protrusion is asymmetric to the right. Mild facet hypertrophy and ligamentum flavum thickening are noted. Moderate right subarticular  narrowing is present. Progressive moderate foraminal narrowing is noted bilaterally, right greater than left. L5-S1: Mild facet hypertrophy is noted bilaterally. No significant disc protrusion or stenosis is present.   03/21/21 Xray left knee Trace patellofemoral spurring. Otherwise unremarkable radiographic appearance.   No Rheumatology ROS completed.   PMFS History:  Patient Active Problem List   Diagnosis Date Noted   Age-related osteoporosis without current pathological fracture 01/24/2024   Osteoporosis due to aromatase inhibitor 02/22/2023   Constipation 05/25/2022   Other fatigue 05/25/2022   Dizziness 05/25/2022   Snoring 07/25/2021   Excessive daytime sleepiness 07/25/2021   Routine general medical examination at a health care facility 07/08/2021   Positive ANA (antinuclear antibody) 03/18/2021   Left leg pain 03/18/2021   Arthralgia 03/11/2021   Back pain 02/26/2021   Neck pain 02/26/2021   Gastroesophageal reflux disease 11/20/2019   Genetic testing 02/01/2018   Major depressive disorder, in remission 03/12/2017   Liver masses 12/06/2016   Recurrent cancer of right breast 12/22/2013   Headache 06/12/2013   Thrombocytopenia 05/22/2013   Malignant neoplasm of lower-inner quadrant of right breast of female, estrogen receptor positive 2012  Past Medical History:  Diagnosis Date   Abdominal pain, epigastric 11/20/2019   Asthma    as a child   Cervical dysplasia    Gastroesophageal reflux disease 11/20/2019   Genetic testing 02/01/2018   Genetic testing was performed in 2015. The [employer] panel was ordered. In total, 23 genes were analyzed as part of this panel: ATM, BARD1, BRCA1, BRCA2, BRIP1, CDH1, CHEK2, EPCAM, MLH1, MRE11A, MSH2, MSH6, MUTYH, NBN, NF1, PALB2, PMS2, PTEN, RAD50, RAD51C, RAD51D, STK11, and TP53. No pathogenic variants were detected. Genetic testing did detect a Variant of Unknown Significance (VUS) at the t   GERD (gastroesophageal reflux  disease)    had during chemo   Headache 06/12/2013   History of chemotherapy    History of kidney stones    History of radiation therapy 11/24/13- 01/08/14   reconstructed right breast/chest wall 4500 cGy 25 sessions, electron beam boost 1440 cGy 8 sessions   Liver masses 12/06/2016   Major depressive disorder, in remission 03/12/2017   Malignant neoplasm of lower-inner quadrant of right breast of female, estrogen receptor positive 2012   Osteoporosis    Pneumonia    PONV (postoperative nausea and vomiting)    Recurrent cancer of right breast 12/22/2013   Thrombocytopenia 05/22/2013   TMJ (dislocation of temporomandibular joint)     Family History  Adopted: Yes  Problem Relation Age of Onset   Colon cancer Mother 67   Rectal cancer Mother    Cervical cancer Mother    Heart attack Father    CAD Father    Cancer Maternal Aunt 60       stomach and ovarian as separate primaries   Cancer Maternal Aunt 55       breast   Stomach cancer Maternal Aunt    Cancer Maternal Uncle 55       lung; smoker   Cancer Maternal Uncle 63       prostate   Cancer Maternal Grandmother 10       enodometrial   Cancer Cousin 40       melanoma; mat first cousin, located on neck   Past Surgical History:  Procedure Laterality Date   ADENOIDECTOMY     BREAST LUMPECTOMY WITH NEEDLE LOCALIZATION Right 05/05/2013   Procedure: EXCISION RECURRENT CANCER RIGHT BREAST WITH NEEDLE LOCALOZATION;  Surgeon: Elon CHRISTELLA Pacini, MD;  Location: [facility] OR;  Service: General;  Laterality: Right;   BREAST RECONSTRUCTION  06/29/2011   Procedure: BREAST RECONSTRUCTION;  Surgeon: Estefana Reichert, DO;  Location: [facility] OR;  Service: Plastics;  Laterality: Bilateral;  Immediate Bilateral Breast Reconstruction with Bilateral Tissue Expanders and placement of  Alloderm    DILATION AND CURETTAGE OF UTERUS     fibroid tumor surgery     HYSTEROSCOPY WITH D & C N/A 03/20/2023   Procedure: DILATATION AND CURETTAGE /HYSTEROSCOPY;  Surgeon:  Cleotilde Ronal RAMAN, MD;  Location: [facility] OR;  Service: Gynecology;  Laterality: N/A;   lazy eye     corrective surgery   MASTECTOMY W/ SENTINEL NODE BIOPSY  06/29/2011   Procedure: MASTECTOMY WITH SENTINEL LYMPH NODE BIOPSY;  Surgeon: Elon CHRISTELLA Pacini, MD;  Location: [facility] OR;  Service: General;  Laterality: Right;  right skin spairing mstectomy and right sentinel lymph node biopsy   PORT-A-CATH REMOVAL  03/12/2012   Procedure: MINOR REMOVAL PORT-A-CATH;  Surgeon: Elon CHRISTELLA Pacini, MD;  Location:  [facility];  Service: General;  Laterality: N/A;  Upper left   PORTACATH PLACEMENT Left 05/05/2013   Procedure:  INSERTION PORT-A-CATH;  Surgeon: Elon CHRISTELLA Pacini, MD;  Location: [facility] OR;  Service: General;  Laterality: Left;   RHINOPLASTY     uterine cervical treatments  04/10/2000   for precancerous lesions   Social History   Social History Narrative   She was adopted by her grandparents as a child.  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  She works in a agricultural consultant.right handed   Two story home    Drinks caffeine occasional   Right handed    There is no immunization history on file for this patient.   Objective: Vital Signs: LMP 03/11/2011    Physical Exam   Musculoskeletal Exam: ***  CDAI Exam: CDAI Score: -- Patient Global: --; Provider Global: -- Swollen: --; Tender: -- Joint Exam 02/27/2024   No joint exam has been documented for this visit   There is currently no information documented on the homunculus. Go to the Rheumatology activity and complete the homunculus joint exam.  Investigation: No additional findings.  Imaging: No results found.  Recent Labs: Lab Results  Component Value Date   WBC 5.8 09/26/2023   HGB 12.9 09/26/2023   PLT 267 09/26/2023   NA 141 09/26/2023   K 3.8 09/26/2023   CL 105 09/26/2023   CO2 29 09/26/2023   GLUCOSE 80 09/26/2023   BUN 12 09/26/2023   CREATININE 0.91 09/26/2023   BILITOT 0.6  09/26/2023   ALKPHOS 86 09/26/2023   AST 19 09/26/2023   ALT 12 09/26/2023   PROT 7.0 09/26/2023   ALBUMIN 4.7 09/26/2023   CALCIUM 9.8 09/26/2023   GFRAA >60 08/11/2019    Speciality Comments: No specialty comments available.  Procedures:  No procedures performed Allergies: Tussionex pennkinetic er [hydrocod poli-chlorphe poli er], Other, Morphine , Morphine  and codeine , and Penicillin g   Assessment / Plan:     Visit Diagnoses: No diagnosis found.  ***  Orders: No orders of the defined types were placed in this encounter.  No orders of the defined types were placed in this encounter.    Follow-Up Instructions: No follow-ups on file.   Daphne Karrer M Maziah Keeling, CMA  Note - This record has been created using Animal nutritionist.  Chart creation errors have been sought, but may not always  have been located. Such creation errors do not reflect on  the standard of medical care.

## 2024-02-25 ENCOUNTER — Telehealth: Payer: Self-pay | Admitting: Internal Medicine

## 2024-02-25 NOTE — Telephone Encounter (Signed)
 Patient would like to know if Dr. Jeannetta will be doing blood work at her appointment and if he could also do cholesterol

## 2024-02-27 ENCOUNTER — Ambulatory Visit: Payer: Self-pay | Admitting: Internal Medicine

## 2024-02-27 DIAGNOSIS — M818 Other osteoporosis without current pathological fracture: Secondary | ICD-10-CM

## 2024-02-27 DIAGNOSIS — G8929 Other chronic pain: Secondary | ICD-10-CM

## 2024-02-27 DIAGNOSIS — R7689 Other specified abnormal immunological findings in serum: Secondary | ICD-10-CM

## 2024-03-25 NOTE — Progress Notes (Unsigned)
° °  GYNECOLOGY  VISIT  CC:   No chief complaint on file.   HPI: 53 y.o. G35P1021 Married White or Caucasian female here for ultrasound follow up.  Patient's last menstrual period was 03/11/2011.  Past Medical History:  Diagnosis Date   Abdominal pain, epigastric 11/20/2019   Asthma    as a child   Cervical dysplasia    Gastroesophageal reflux disease 11/20/2019   Genetic testing 02/01/2018   Genetic testing was performed in 2015. The [employer] panel was ordered. In total, 23 genes were analyzed as part of this panel: ATM, BARD1, BRCA1, BRCA2, BRIP1, CDH1, CHEK2, EPCAM, MLH1, MRE11A, MSH2, MSH6, MUTYH, NBN, NF1, PALB2, PMS2, PTEN, RAD50, RAD51C, RAD51D, STK11, and TP53. No pathogenic variants were detected. Genetic testing did detect a Variant of Unknown Significance (VUS) at the t   GERD (gastroesophageal reflux disease)    had during chemo   Headache 06/12/2013   History of chemotherapy    History of kidney stones    History of radiation therapy 11/24/13- 01/08/14   reconstructed right breast/chest wall 4500 cGy 25 sessions, electron beam boost 1440 cGy 8 sessions   Liver masses 12/06/2016   Major depressive disorder, in remission 03/12/2017   Malignant neoplasm of lower-inner quadrant of right breast of female, estrogen receptor positive 2012   Osteoporosis    Pneumonia    PONV (postoperative nausea and vomiting)    Recurrent cancer of right breast 12/22/2013   Thrombocytopenia 05/22/2013   TMJ (dislocation of temporomandibular joint)     MEDS:  Reviewed in EPIC  ALLERGIES: Tussionex pennkinetic er [hydrocod poli-chlorphe poli er], Other, Morphine , Morphine  and codeine , and Penicillin g  SH:  ***  ROS  PHYSICAL EXAMINATION:    LMP 03/11/2011     General appearance: alert, cooperative and appears stated age Neck: no adenopathy, supple, symmetrical, trachea midline and thyroid  {CHL AMB PHY EX THYROID  NORM [phone]::normal to inspection and palpation} CV:   {Exam; heart brief:31539} Lungs:  {pe lungs ob:314451} Breasts: {Exam; breast:13139::normal appearance, no masses or tenderness} Abdomen: soft, non-tender; bowel sounds normal; no masses,  no organomegaly Lymph:  no inguinal LAD noted  Pelvic: External genitalia:  no lesions              Urethra:  normal appearing urethra with no masses, tenderness or lesions              Bartholins and Skenes: normal                 Vagina: {exam; pelvic vaginal:30846}              Cervix: {CHL AMB PHY EX CERVIX NORM DEFAULT:[phone]::no lesions}              Bimanual Exam:  Uterus:  {CHL AMB PHY EX UTERUS NORM DEFAULT:[phone]::normal size, contour, position, consistency, mobility, non-tender}              Adnexa: {CHL AMB PHY EX ADNEXA NO MASS DEFAULT:[phone]::no mass, fullness, tenderness}              Rectovaginal: {yes no:[id number]}.  Confirms.              Anus:  normal sphincter tone, no lesions  Chaperone was present for exam.  Assessment/Plan: There are no diagnoses linked to this encounter.

## 2024-03-26 ENCOUNTER — Encounter (HOSPITAL_BASED_OUTPATIENT_CLINIC_OR_DEPARTMENT_OTHER): Payer: Self-pay | Admitting: Obstetrics & Gynecology

## 2024-03-26 ENCOUNTER — Ambulatory Visit (HOSPITAL_BASED_OUTPATIENT_CLINIC_OR_DEPARTMENT_OTHER): Payer: Self-pay | Admitting: Obstetrics & Gynecology

## 2024-03-26 ENCOUNTER — Ambulatory Visit (INDEPENDENT_AMBULATORY_CARE_PROVIDER_SITE_OTHER)

## 2024-03-26 VITALS — BP 99/63 | HR 76 | Ht 63.0 in | Wt 125.0 lb

## 2024-03-26 DIAGNOSIS — Z8742 Personal history of other diseases of the female genital tract: Secondary | ICD-10-CM

## 2024-03-26 DIAGNOSIS — Z853 Personal history of malignant neoplasm of breast: Secondary | ICD-10-CM | POA: Diagnosis not present

## 2024-03-26 DIAGNOSIS — Z79899 Other long term (current) drug therapy: Secondary | ICD-10-CM

## 2024-03-26 DIAGNOSIS — N840 Polyp of corpus uteri: Secondary | ICD-10-CM

## 2024-04-16 ENCOUNTER — Inpatient Hospital Stay

## 2024-04-16 ENCOUNTER — Other Ambulatory Visit: Payer: Self-pay

## 2024-04-16 DIAGNOSIS — Z170 Estrogen receptor positive status [ER+]: Secondary | ICD-10-CM

## 2024-04-30 ENCOUNTER — Ambulatory Visit (HOSPITAL_BASED_OUTPATIENT_CLINIC_OR_DEPARTMENT_OTHER): Payer: Self-pay | Admitting: Obstetrics & Gynecology

## 2024-04-30 ENCOUNTER — Other Ambulatory Visit (HOSPITAL_BASED_OUTPATIENT_CLINIC_OR_DEPARTMENT_OTHER)

## 2024-09-30 ENCOUNTER — Inpatient Hospital Stay

## 2024-10-09 ENCOUNTER — Inpatient Hospital Stay: Admitting: Adult Health

## 2024-10-23 ENCOUNTER — Ambulatory Visit (HOSPITAL_BASED_OUTPATIENT_CLINIC_OR_DEPARTMENT_OTHER): Payer: Self-pay | Admitting: Obstetrics & Gynecology
